# Patient Record
Sex: Male | Born: 1935
Health system: Southern US, Community
[De-identification: ages and names within clinical notes are randomized; demographics above are authoritative.]

## PROBLEM LIST (undated history)

## (undated) ENCOUNTER — Emergency Department (HOSPITAL_COMMUNITY): Admission: EM | Discharge status: Against Medical Advice | Payer: Medicare Other

## (undated) DIAGNOSIS — R0602 Shortness of breath: Secondary | ICD-10-CM

## (undated) DIAGNOSIS — E7800 Pure hypercholesterolemia, unspecified: Secondary | ICD-10-CM

## (undated) DIAGNOSIS — G4730 Sleep apnea, unspecified: Secondary | ICD-10-CM

## (undated) DIAGNOSIS — I2510 Atherosclerotic heart disease of native coronary artery without angina pectoris: Secondary | ICD-10-CM

## (undated) DIAGNOSIS — I739 Peripheral vascular disease, unspecified: Secondary | ICD-10-CM

## (undated) DIAGNOSIS — K219 Gastro-esophageal reflux disease without esophagitis: Secondary | ICD-10-CM

## (undated) DIAGNOSIS — I82409 Acute embolism and thrombosis of unspecified deep veins of unspecified lower extremity: Secondary | ICD-10-CM

## (undated) DIAGNOSIS — G4733 Obstructive sleep apnea (adult) (pediatric): Secondary | ICD-10-CM

## (undated) DIAGNOSIS — I10 Essential (primary) hypertension: Secondary | ICD-10-CM

## (undated) DIAGNOSIS — K5792 Diverticulitis of intestine, part unspecified, without perforation or abscess without bleeding: Secondary | ICD-10-CM

## (undated) DIAGNOSIS — E039 Hypothyroidism, unspecified: Secondary | ICD-10-CM

## (undated) DIAGNOSIS — E669 Obesity, unspecified: Secondary | ICD-10-CM

## (undated) DIAGNOSIS — J449 Chronic obstructive pulmonary disease, unspecified: Secondary | ICD-10-CM

## (undated) DIAGNOSIS — I5032 Chronic diastolic (congestive) heart failure: Secondary | ICD-10-CM

## (undated) DIAGNOSIS — F419 Anxiety disorder, unspecified: Secondary | ICD-10-CM

## (undated) HISTORY — DX: Obesity, unspecified: E66.9

## (undated) HISTORY — DX: Diverticulitis of intestine, part unspecified, without perforation or abscess without bleeding: K57.92

## (undated) HISTORY — DX: Chronic diastolic (congestive) heart failure: I50.32

## (undated) HISTORY — PX: CORONARY STENT PLACEMENT: SHX1402

## (undated) HISTORY — DX: Obstructive sleep apnea (adult) (pediatric): G47.33

## (undated) HISTORY — DX: Acute embolism and thrombosis of unspecified deep veins of unspecified lower extremity: I82.409

---

## 1990-10-14 HISTORY — PX: HERNIA REPAIR: SHX51

## 2002-08-05 ENCOUNTER — Ambulatory Visit (HOSPITAL_COMMUNITY): Admission: RE | Admit: 2002-08-05 | Discharge: 2002-08-05 | Payer: Self-pay | Admitting: Family Medicine

## 2002-08-05 ENCOUNTER — Encounter: Payer: Self-pay | Admitting: Family Medicine

## 2003-11-02 ENCOUNTER — Ambulatory Visit (HOSPITAL_COMMUNITY): Admission: RE | Admit: 2003-11-02 | Discharge: 2003-11-02 | Payer: Self-pay | Admitting: Gastroenterology

## 2003-11-02 ENCOUNTER — Encounter (INDEPENDENT_AMBULATORY_CARE_PROVIDER_SITE_OTHER): Payer: Self-pay | Admitting: Specialist

## 2003-12-05 ENCOUNTER — Ambulatory Visit (HOSPITAL_COMMUNITY): Admission: RE | Admit: 2003-12-05 | Discharge: 2003-12-05 | Payer: Self-pay | Admitting: Gastroenterology

## 2003-12-06 ENCOUNTER — Ambulatory Visit (HOSPITAL_COMMUNITY): Admission: RE | Admit: 2003-12-06 | Discharge: 2003-12-06 | Payer: Self-pay | Admitting: Gastroenterology

## 2003-12-09 ENCOUNTER — Ambulatory Visit (HOSPITAL_COMMUNITY): Admission: RE | Admit: 2003-12-09 | Discharge: 2003-12-09 | Payer: Self-pay | Admitting: Gastroenterology

## 2004-01-31 ENCOUNTER — Encounter: Admission: RE | Admit: 2004-01-31 | Discharge: 2004-01-31 | Payer: Self-pay | Admitting: Family Medicine

## 2004-02-13 ENCOUNTER — Encounter: Admission: RE | Admit: 2004-02-13 | Discharge: 2004-02-13 | Payer: Self-pay | Admitting: Family Medicine

## 2004-06-05 ENCOUNTER — Encounter: Admission: RE | Admit: 2004-06-05 | Discharge: 2004-06-05 | Payer: Self-pay | Admitting: Family Medicine

## 2004-08-22 ENCOUNTER — Ambulatory Visit (HOSPITAL_COMMUNITY): Admission: RE | Admit: 2004-08-22 | Discharge: 2004-08-22 | Payer: Self-pay | Admitting: Orthopedic Surgery

## 2004-08-27 ENCOUNTER — Encounter (INDEPENDENT_AMBULATORY_CARE_PROVIDER_SITE_OTHER): Payer: Self-pay | Admitting: *Deleted

## 2004-08-27 ENCOUNTER — Ambulatory Visit (HOSPITAL_COMMUNITY): Admission: RE | Admit: 2004-08-27 | Discharge: 2004-08-27 | Payer: Self-pay | Admitting: Gastroenterology

## 2004-08-28 ENCOUNTER — Ambulatory Visit (HOSPITAL_COMMUNITY): Admission: RE | Admit: 2004-08-28 | Discharge: 2004-08-28 | Payer: Self-pay | Admitting: Orthopedic Surgery

## 2005-01-30 ENCOUNTER — Encounter: Admission: RE | Admit: 2005-01-30 | Discharge: 2005-01-30 | Payer: Self-pay | Admitting: Family Medicine

## 2005-05-02 ENCOUNTER — Ambulatory Visit (HOSPITAL_BASED_OUTPATIENT_CLINIC_OR_DEPARTMENT_OTHER): Admission: RE | Admit: 2005-05-02 | Discharge: 2005-05-02 | Payer: Self-pay | Admitting: Family Medicine

## 2005-05-05 ENCOUNTER — Ambulatory Visit: Payer: Self-pay | Admitting: Internal Medicine

## 2005-06-28 ENCOUNTER — Encounter: Admission: RE | Admit: 2005-06-28 | Discharge: 2005-06-28 | Payer: Self-pay | Admitting: Cardiology

## 2005-07-11 ENCOUNTER — Inpatient Hospital Stay (HOSPITAL_COMMUNITY): Admission: EM | Admit: 2005-07-11 | Discharge: 2005-07-13 | Payer: Self-pay | Admitting: Emergency Medicine

## 2006-04-11 ENCOUNTER — Encounter: Admission: RE | Admit: 2006-04-11 | Discharge: 2006-04-11 | Payer: Self-pay | Admitting: Family Medicine

## 2006-04-14 ENCOUNTER — Encounter: Admission: RE | Admit: 2006-04-14 | Discharge: 2006-04-14 | Payer: Self-pay | Admitting: Family Medicine

## 2006-11-14 DIAGNOSIS — I739 Peripheral vascular disease, unspecified: Secondary | ICD-10-CM

## 2006-11-14 HISTORY — DX: Peripheral vascular disease, unspecified: I73.9

## 2006-11-18 ENCOUNTER — Ambulatory Visit: Payer: Self-pay | Admitting: Vascular Surgery

## 2006-11-20 ENCOUNTER — Ambulatory Visit: Payer: Self-pay | Admitting: Vascular Surgery

## 2006-11-20 ENCOUNTER — Ambulatory Visit (HOSPITAL_COMMUNITY): Admission: RE | Admit: 2006-11-20 | Discharge: 2006-11-20 | Payer: Self-pay | Admitting: Vascular Surgery

## 2006-12-16 ENCOUNTER — Ambulatory Visit: Payer: Self-pay | Admitting: Vascular Surgery

## 2007-01-13 ENCOUNTER — Ambulatory Visit: Payer: Self-pay | Admitting: Vascular Surgery

## 2007-04-14 ENCOUNTER — Ambulatory Visit: Payer: Self-pay | Admitting: Vascular Surgery

## 2007-07-06 ENCOUNTER — Encounter: Admission: RE | Admit: 2007-07-06 | Discharge: 2007-07-06 | Payer: Self-pay | Admitting: Gastroenterology

## 2007-07-22 ENCOUNTER — Encounter: Admission: RE | Admit: 2007-07-22 | Discharge: 2007-07-22 | Payer: Self-pay | Admitting: Family Medicine

## 2007-07-23 ENCOUNTER — Inpatient Hospital Stay (HOSPITAL_COMMUNITY): Admission: EM | Admit: 2007-07-23 | Discharge: 2007-07-30 | Payer: Self-pay | Admitting: Emergency Medicine

## 2007-09-08 ENCOUNTER — Encounter: Admission: RE | Admit: 2007-09-08 | Discharge: 2007-09-08 | Payer: Self-pay | Admitting: Family Medicine

## 2007-09-18 ENCOUNTER — Encounter: Admission: RE | Admit: 2007-09-18 | Discharge: 2007-09-18 | Payer: Self-pay | Admitting: Family Medicine

## 2007-10-21 ENCOUNTER — Encounter (INDEPENDENT_AMBULATORY_CARE_PROVIDER_SITE_OTHER): Payer: Self-pay | Admitting: Gastroenterology

## 2007-10-21 ENCOUNTER — Ambulatory Visit (HOSPITAL_COMMUNITY): Admission: RE | Admit: 2007-10-21 | Discharge: 2007-10-21 | Payer: Self-pay | Admitting: Gastroenterology

## 2007-10-27 ENCOUNTER — Ambulatory Visit: Payer: Self-pay | Admitting: Vascular Surgery

## 2008-01-05 ENCOUNTER — Inpatient Hospital Stay (HOSPITAL_COMMUNITY): Admission: EM | Admit: 2008-01-05 | Discharge: 2008-01-07 | Payer: Self-pay | Admitting: Emergency Medicine

## 2008-01-05 ENCOUNTER — Ambulatory Visit: Payer: Self-pay | Admitting: Cardiology

## 2008-01-06 ENCOUNTER — Encounter (INDEPENDENT_AMBULATORY_CARE_PROVIDER_SITE_OTHER): Payer: Self-pay | Admitting: Internal Medicine

## 2008-04-26 ENCOUNTER — Encounter: Admission: RE | Admit: 2008-04-26 | Discharge: 2008-04-26 | Payer: Self-pay | Admitting: Family Medicine

## 2008-11-01 ENCOUNTER — Ambulatory Visit: Payer: Self-pay | Admitting: Vascular Surgery

## 2009-03-20 ENCOUNTER — Encounter: Admission: RE | Admit: 2009-03-20 | Discharge: 2009-03-20 | Payer: Self-pay | Admitting: Family Medicine

## 2009-04-07 ENCOUNTER — Inpatient Hospital Stay (HOSPITAL_COMMUNITY): Admission: EM | Admit: 2009-04-07 | Discharge: 2009-04-12 | Payer: Self-pay | Admitting: Emergency Medicine

## 2009-04-07 ENCOUNTER — Encounter (INDEPENDENT_AMBULATORY_CARE_PROVIDER_SITE_OTHER): Payer: Self-pay | Admitting: Internal Medicine

## 2009-04-09 ENCOUNTER — Encounter: Payer: Self-pay | Admitting: Cardiology

## 2009-04-11 ENCOUNTER — Other Ambulatory Visit: Payer: Self-pay | Admitting: Cardiology

## 2009-04-12 ENCOUNTER — Other Ambulatory Visit: Payer: Self-pay | Admitting: Cardiology

## 2009-04-12 ENCOUNTER — Other Ambulatory Visit: Payer: Self-pay | Admitting: Interventional Cardiology

## 2009-05-16 ENCOUNTER — Ambulatory Visit: Payer: Self-pay | Admitting: Vascular Surgery

## 2009-10-23 ENCOUNTER — Encounter: Payer: Self-pay | Admitting: Internal Medicine

## 2009-10-26 ENCOUNTER — Encounter: Admission: RE | Admit: 2009-10-26 | Discharge: 2009-10-26 | Payer: Self-pay | Admitting: Family Medicine

## 2009-11-01 ENCOUNTER — Ambulatory Visit (HOSPITAL_BASED_OUTPATIENT_CLINIC_OR_DEPARTMENT_OTHER): Admission: RE | Admit: 2009-11-01 | Discharge: 2009-11-01 | Payer: Self-pay | Admitting: Orthopedic Surgery

## 2009-11-02 ENCOUNTER — Encounter: Payer: Self-pay | Admitting: Internal Medicine

## 2009-11-13 ENCOUNTER — Encounter: Payer: Self-pay | Admitting: Internal Medicine

## 2009-11-21 ENCOUNTER — Ambulatory Visit: Payer: Self-pay | Admitting: Internal Medicine

## 2009-11-21 DIAGNOSIS — I10 Essential (primary) hypertension: Secondary | ICD-10-CM | POA: Insufficient documentation

## 2009-11-21 DIAGNOSIS — Z8672 Personal history of thrombophlebitis: Secondary | ICD-10-CM | POA: Insufficient documentation

## 2009-11-21 DIAGNOSIS — E785 Hyperlipidemia, unspecified: Secondary | ICD-10-CM

## 2009-11-28 ENCOUNTER — Ambulatory Visit: Payer: Self-pay | Admitting: Vascular Surgery

## 2009-12-07 ENCOUNTER — Ambulatory Visit: Payer: Self-pay | Admitting: Internal Medicine

## 2009-12-07 DIAGNOSIS — R635 Abnormal weight gain: Secondary | ICD-10-CM | POA: Insufficient documentation

## 2009-12-18 ENCOUNTER — Ambulatory Visit: Payer: Self-pay | Admitting: Internal Medicine

## 2010-05-02 ENCOUNTER — Ambulatory Visit (HOSPITAL_COMMUNITY)
Admission: RE | Admit: 2010-05-02 | Discharge: 2010-05-02 | Payer: Self-pay | Source: Home / Self Care | Admitting: Orthopedic Surgery

## 2010-08-14 ENCOUNTER — Ambulatory Visit: Payer: Self-pay | Admitting: Vascular Surgery

## 2010-09-20 ENCOUNTER — Encounter: Admission: RE | Admit: 2010-09-20 | Payer: Self-pay | Source: Home / Self Care | Admitting: Family Medicine

## 2010-10-09 ENCOUNTER — Ambulatory Visit: Payer: Self-pay | Admitting: Vascular Surgery

## 2010-11-04 ENCOUNTER — Encounter: Payer: Self-pay | Admitting: Cardiology

## 2010-11-13 NOTE — Letter (Signed)
Summary: [facility]  [facility]   Imported By: Lester Nelson 11/27/2009 10:28:28  _____________________________________________________________________  External Attachment:    Type:   Image     Comment:   External Document

## 2010-11-13 NOTE — Assessment & Plan Note (Signed)
Summary: Pulmonary/ new pt eval for sob   Visit Type:  Initial Consult Copy to:  Dr. Armanda Magic Primary Provider/Referring Provider:  Dr. Lupita Raider  CC:  Dyspnea.  History of Present Illness: 31 yobm  with morbid obesity quit smoking in the 1965 with no resp problems needs to be be cleared for surgery by Dr Mina Marble.  November 21, 2009 initial cc sob x 3 years progressively more doe x with some variability worse with hill and also trouble lying down immediate sob sleeps with pillows on a wedge, assoc with worsening cough, mostly dry, worse as day goes on assoc with hoarseness.  cards eval in progress.    Pt denies any significant sore throat, dysphagia, itching, sneezing,  nasal congestion or excess secretions,  fever, chills, sweats, unintended wt loss, pleuritic or exertional cp, hempoptysis, change in activity tolerance  orthopnea pnd or leg swelling Pt also denies any obvious fluctuation in symptoms with weather or environmental change or other alleviating or aggravating factors.       Current Medications (verified): 1)  Px Enteric Aspirin 325 Mg Tbec (Aspirin) .Marland Kitchen.. 1 Once Daily 2)  Levothyroxine Sodium 175 Mcg Tabs (Levothyroxine Sodium) .Marland Kitchen.. 1 Once Daily 3)  Vytorin 10-80 Mg Tabs (Ezetimibe-Simvastatin) .Marland Kitchen.. 1 Once Daily 4)  Furosemide 20 Mg Tabs (Furosemide) .Marland Kitchen.. 1 Once Daily 5)  Metoprolol Tartrate 25 Mg Tabs (Metoprolol Tartrate) .Marland Kitchen.. 1 Once Daily 6)  Glimepiride 1 Mg Tabs (Glimepiride) .Marland Kitchen.. 1 Once Daily 7)  Isosorbide Dinitrate 30 Mg Tabs (Isosorbide Dinitrate) .Marland Kitchen.. 1 Once Daily 8)  Imodium A-D 2 Mg Tabs (Loperamide Hcl) .... As Needed 9)  Beano  Tabs (Alpha-D-Galactosidase) .... As Needed 10)  Zyrtec Allergy 10 Mg Tabs (Cetirizine Hcl) .Marland Kitchen.. 1 Once Daily As Needed  Allergies (verified): 1)  ! Plavix (Clopidogrel Bisulfate)  Past History:  Past Medical History: MORBID OBESITY OBSTRUCTIVE SLEEP APNEA (ICD-327.23) Hx of DEEP VENOUS THROMBOPHLEBITIS, LEFT, LEG, HX  OF (ICD-V12.52) HYPERLIPIDEMIA (ICD-272.4) ASTHMA (ICD-493.90) DYSPHAGIA    - Ba swallow 1/13/2011Slow transit of thick contrast in the distal thoracic esophagus and through the gastroesophageal junction, without any evidence of fixed stricture or mass.  The finding seems most likely related to spasm of the distal thoracic esophagus and lower esophageal sphincter. 2.  Normal cervical and proximal thoracic esophagus. 3.  Incidental right laryngeal diverticulum.  Family History: Heart dz- Sister Breast CA- Sister  Social History: Former smoker.  Quit 1027.   Widowed Children Lives with his Daughter Retired  Review of Systems       The patient complains of shortness of breath with activity, shortness of breath at rest, productive cough, chest pain, acid heartburn, indigestion, loss of appetite, difficulty swallowing, tooth/dental problems, sneezing, hand/feet swelling, and joint stiffness or pain.  The patient denies non-productive cough, coughing up blood, irregular heartbeats, weight change, abdominal pain, sore throat, headaches, nasal congestion/difficulty breathing through nose, itching, ear ache, anxiety, depression, rash, change in color of mucus, and fever.    Vital Signs:  Patient profile:   77 year old male Height:      64 inches Weight:      225.25 pounds BMI:     38.80 O2 Sat:      95 % on Room air Temp:     97.7 degrees F oral Pulse rate:   77 / minute BP sitting:   126 / 64  (left arm) Cuff size:   large  Vitals Entered By: Vernie Murders (November 21, 2009 11:40  AM)  O2 Flow:  Room air  Serial Vital Signs/Assessments:  Comments: 12:29 PM Ambulatory Pulse Oximetry  Resting; HR__77___    02 Sat__91%ra___  Lap1 (185 feet)   HR__96___   02 Sat__90%ra___ Lap2 (185 feet)   HR__105___   02 Sat__87%ra___    Lap3 (185 feet)   HR_____   02 Sat_____  ___Test Completed without Difficulty _x__Test Stopped due [id number] o2 sat   By: Vernie Murders    Physical Exam  Additional Exam:  wt 225 November 21, 2009 obese amb black male slt hoarse with classic voice fatigue HEENT mild turbinate edema.  Oropharynx no thrush or excess pnd or cobblestoning.  No JVD or cervical adenopathy. Mild accessory muscle hypertrophy. Trachea midline, nl thryroid. Chest was hyperinflated by percussion with diminished breath sounds and moderate increased exp time without wheeze. Hoover sign positive at mid inspiration. Regular rate and rhythm without murmur gallop or rub.  slt   increase in P2 and 1+ edema.  Abd: no hsm, nl excursion. Ext warm without cyanosis or clubbing.     CXR  Procedure date:  11/21/2009  Findings:      Comparison: 04/06/2009   Findings: Cardiomediastinal silhouette is stable.  No acute infiltrate or pleural effusion.  No pulmonary edema. Stable calcified granuloma left upper lobe measures about  1 cm. Mild degenerative changes mid thoracic spine. Scarring or atelectasis in the right middle lobe.   IMPRESSION: No infiltrate or edema.  Scarring or atelectasis in the right middle lobe.  Stable calcified granuloma left upper lobe.  Impression & Recommendations:  Problem # 1:  ASTHMA (ICD-493.90)  DDX of  difficult airways managment all start with A and  include Adherence, Ace Inhibitors, Acid Reflux, Active Sinus Disease, Alpha 1 Antitripsin deficiency, Anxiety masquerading as Airways dz,  ABPA,  allergy(esp in young), Aspiration (esp in elderly), Adverse effects of DPI,  Active smokers, plus one B  = Beta blocker use..   Not sure he has asthma but variability suggests either asthma or pseudoasthma both potentially exac by Acid Reflux, occult Active sinus dz or BB use   I spent extra time with the patient today explaining optimal mdi  technique.  This improved from  25-50%  Problem # 2:  HYPERTENSION, BENIGN (ICD-401.1)  The following medications were removed from the medication list:    Metoprolol Tartrate 25 Mg Tabs  (Metoprolol tartrate) .Marland Kitchen... 1 once daily His updated medication list for this problem includes:    Furosemide 20 Mg Tabs (Furosemide) .Marland Kitchen... 1 once daily    Bystolic 5 Mg Tabs (Nebivolol hcl) ..... One tablet daily  Orders: New Patient Level V (10175)  Problem # 3:  OBSTRUCTIVE SLEEP APNEA (ICD-327.23)  needs to try cpap and 02 as prescribed  Orders: New Patient Level V (10258)  Medications Added to Medication List This Visit: 1)  Px Enteric Aspirin 325 Mg Tbec (Aspirin) .Marland Kitchen.. 1 once daily 2)  Levothyroxine Sodium 175 Mcg Tabs (Levothyroxine sodium) .Marland Kitchen.. 1 once daily 3)  Vytorin 10-80 Mg Tabs (Ezetimibe-simvastatin) .Marland Kitchen.. 1 once daily 4)  Furosemide 20 Mg Tabs (Furosemide) .Marland Kitchen.. 1 once daily 5)  Metoprolol Tartrate 25 Mg Tabs (Metoprolol tartrate) .Marland Kitchen.. 1 once daily 6)  Glimepiride 1 Mg Tabs (Glimepiride) .Marland Kitchen.. 1 once daily 7)  Isosorbide Dinitrate 30 Mg Tabs (Isosorbide dinitrate) .Marland Kitchen.. 1 once daily 8)  Imodium A-d 2 Mg Tabs (Loperamide hcl) .... As needed 9)  Beano Tabs (Alpha-d-galactosidase) .... As needed 10)  Zyrtec Allergy 10 Mg Tabs (  Cetirizine hcl) .Marland Kitchen.. 1 once daily as needed 11)  Dexilant 60 Mg Cpdr (Dexlansoprazole) .... Take  one 30-60 min before first meal of the day 12)  Pepcid 20  .... One at bedtime 13)  Bystolic 5 Mg Tabs (Nebivolol hcl) .... One tablet daily 14)  Symbicort 160-4.5 Mcg/act Aero (Budesonide-formoterol fumarate) .... 2 puffs first thing  in am and 2 puffs again in pm about 12 hours later  Other Orders: T-2 View CXR (71020TC) Pulse Oximetry, Ambulatory (16109)  Patient Instructions: 1)  Stop metaprolol and start bystolic 5 mg one daily in its place 2)  Acid reflux is a leading suspect here and needs to be eliminated  completely before considering additional studies or treatment options. To suppress this maximally, take DEXILANT 60 mg 30 min before first  meal and  PEPCID ac  20 mg (otc) at bedtime (whether you think you need it or now)  plus diet measures  as listed.  3)  GERD (REFLUX)  is a common cause of respiratory symptoms. It commonly presents without heartburn and can be treated with medication, but also with lifestyle changes including avoidance of late meals, excessive alcohol, smoking cessation, and avoid fatty foods, chocolate, peppermint, colas, red wine, and acidic juices such as orange juice. NO MINT OR MENTHOL PRODUCTS SO NO COUGH DROPS  4)  USE SUGARLESS CANDY INSTEAD (jolley ranchers)  5)  NO OIL BASED VITAMINS  6)  Wear bipap at bedtime 7)  If you can tolerate lying  flat on cpap/02  with the changes above you are cleared for surgery but you'll need to have your surgery as inpt and take the bipap with you 8)  either way need to return in 2 weeks

## 2010-11-13 NOTE — Miscellaneous (Signed)
Summary: Orders Update pft charges  Clinical Lists Changes  Orders: Added new Service order of Carbon Monoxide diffusing w/capacity (94720) - Signed Added new Service order of Lung Volumes (94240) - Signed Added new Service order of Spirometry (Pre & Post) (94060) - Signed 

## 2010-11-13 NOTE — Assessment & Plan Note (Signed)
Summary: Pulmonary/ f/u ov with hfa teaching and amb sat/spirometry   Copy to:  Dr. Armanda Magic Primary Provider/Referring Provider:  Dr. Lupita Raider  CC:  2 wk followup. Pt states that his breathing is about the same- "maybe a little better".  He still can not lay down flat on his back without getting out of breath.  He states that dexilant and pepcid has helped with swallowing.Marland Kitchen  History of Present Illness: 81 yobm  with morbid obesity quit smoking in the 1965 with no resp problems needs to be be cleared for surgery by Dr Mina Marble.  November 21, 2009 initial cc sob x 3 years progressively more doe x with some variability worse with hills and also trouble lying down immediate sob sleeps with pillows on a wedge, assoc with worsening cough, mostly dry, worse as day goes on assoc with hoarseness.  cards eval in progress. rec Stop metaprolol and start bystolic 5 mg one daily in its place take DEXILANT 60 mg 30 min before first  meal and  PEPCID ac  20 mg (otc) at bedtime (whether you think you need it or now)  plus diet measures as listed. Wear bipap at bedtime. Trial of symbicort    December 07, 2009 2 wk followup. Pt states that his breathing is about the same- "maybe a little better".  He still can not lay down flat on his back without getting out of breath.  He states that dexilant and pepcid has helped with swallowing. Stopped Dr Norris Cross anticoagulant because he read it could cause bleeding but has no bleeding.  Pt denies any significant sore throat, dysphagia, itching, sneezing,  nasal congestion or excess secretions,  fever, chills, sweats, unintended wt loss, pleuritic or exertional cp, hempoptysis, change in activity tolerance  orthopnea pnd or leg swelling Pt also denies any obvious fluctuation in symptoms with weather or environmental change or other alleviating or aggravating factors.     Not using symbicort as rec.      Current Medications (verified): 1)  Px Enteric Aspirin 325  Mg Tbec (Aspirin) .Marland Kitchen.. 1 Once Daily 2)  Levothyroxine Sodium 175 Mcg Tabs (Levothyroxine Sodium) .Marland Kitchen.. 1 Once Daily 3)  Vytorin 10-80 Mg Tabs (Ezetimibe-Simvastatin) .Marland Kitchen.. 1 Once Daily 4)  Furosemide 20 Mg Tabs (Furosemide) .Marland Kitchen.. 1 Once Daily 5)  Glimepiride 1 Mg Tabs (Glimepiride) .Marland Kitchen.. 1 Once Daily 6)  Isosorbide Dinitrate 30 Mg Tabs (Isosorbide Dinitrate) .Marland Kitchen.. 1 Once Daily 7)  Imodium A-D 2 Mg Tabs (Loperamide Hcl) .... As Needed 8)  Beano  Tabs (Alpha-D-Galactosidase) .... As Needed 9)  Zyrtec Allergy 10 Mg Tabs (Cetirizine Hcl) .Marland Kitchen.. 1 Once Daily As Needed 10)  Dexilant 60 Mg Cpdr (Dexlansoprazole) .... Take  One 30-60 Min Before First Meal of The Day 11)  Pepcid 20 .... One At Bedtime 12)  Bystolic 5 Mg  Tabs (Nebivolol Hcl) .... One Tablet Daily 13)  Symbicort 160-4.5 Mcg/act  Aero (Budesonide-Formoterol Fumarate) .... 2 Puffs First Thing  in Am and 2 Puffs Again in Pm About 12 Hours Later  Allergies (verified): 1)  ! Plavix (Clopidogrel Bisulfate)  Past History:  Past Medical History: MORBID OBESITY OBSTRUCTIVE SLEEP APNEA (ICD-327.23) Hx of DEEP VENOUS THROMBOPHLEBITIS, LEFT, LEG, HX OF (ICD-V12.52) HYPERLIPIDEMIA (ICD-272.4) ASTHMA (ICD-493.90)     - PFT's December 07, 2009 FEV1/FVC 50% DYSPHAGIA...................................................................Johnson     - Ba swallow 1/13/2011Slow transit of thick contrast in the distal thoracic esophagus and through the gastroesophageal junction, without any evidence of fixed stricture or mass.  The finding seems most likely related to spasm of the distal thoracic esophagus and lower esophageal sphincter. 2.  Normal cervical and proximal thoracic esophagus. 3.  Incidental right laryngeal diverticulum.  Vital Signs:  Patient profile:   77 year old male Weight:      225 pounds O2 Sat:      95 % on Room air Temp:     97.7 degrees F oral Pulse rate:   80 / minute BP sitting:   140 / 78  (left arm) Cuff size:    large  Vitals Entered By: Vernie Murders (December 07, 2009 3:57 PM)  O2 Flow:  Room air  Serial Vital Signs/Assessments:  Comments: 5:31 PM Ambulatory Pulse Oximetry  Resting; HR__80___    02 Sat__94%ra___  Lap1 (185 feet)   HR__101___   02 Sat__92%ra___ Lap2 (185 feet)   HR_127____   02 Sat__87%ra___    Lap3 (185 feet)   HR_____   02 Sat_____  ___Test Completed without Difficulty _x__Test Stopped due to: decreased o2 sats   By: Vernie Murders    Physical Exam  Additional Exam:  wt 225 November 21, 2009 > 225 December 07, 2009  obese amb black male slt hoarse with classic voice fatigue minimally improved HEENT mild turbinate edema.  Oropharynx no thrush or excess pnd or cobblestoning.  No JVD or cervical adenopathy. Mild accessory muscle hypertrophy. Trachea midline, nl thryroid. Chest was hyperinflated by percussion with diminished breath sounds and moderate increased exp time without wheeze. Hoover sign positive at mid inspiration. Regular rate and rhythm without murmur gallop or rub.  slt   increase in P2 and 1+ edema.  Abd: no hsm,  limited supine  excursion. Ext warm without cyanosis or clubbing.     Pulmonary Function Test Date: 12/07/2009 5:03 PM Gender: Male  Pre-Spirometry FVC    Value: 7.15 L/min   % Pred: 254 % FEV1    Value: 3.54 L     Pred: 2.03 L     % Pred: 174.80 % FEV1/FVC  Value: 49.55 %     % Pred: 65.90 %  Impression & Recommendations:  Problem # 1:  ASTHMA (ICD-493.90)   DDX of  difficult airways managment all start with A and  include Adherence, Ace Inhibitors, Acid Reflux, Active Sinus Disease, Alpha 1 Antitripsin deficiency, Anxiety masquerading as Airways dz,  ABPA,  allergy(esp in young), Aspiration (esp in elderly), Adverse effects of DPI,  Active smokers, plus one B  = Beta blocker use..   Not sure he has asthma but variability suggests either asthma or pseudoasthma both potentially exac by Acid Reflux, occult Active sinus dz or BB use   I  spent extra time with the patient today explaining optimal mdi  technique.  This improved from  50> 75%  Problem # 2:  HYPERTENSION, BENIGN (ICD-401.1)  His updated medication list for this problem includes:    Furosemide 20 Mg Tabs (Furosemide) .Marland Kitchen... 1 once daily    Bystolic 5 Mg Tabs (Nebivolol hcl) ..... One tablet daily  Orders: Est. Patient Level IV (69629)  Problem # 3:  WEIGHT GAIN, ABNORMAL (ICD-783.1)  This is a major issue for purposes of clearing him for surgery but this is relatively low risk RUE which may need general if gets in any trouble supine; however, he will need to be cleared for this by Dr Mayford Knife and communicate with her more directily re: risk/benefits of anticoagulants.  Orders: Est. Patient Level IV (52841)  Other Orders: Pulse  Oximetry, Ambulatory (78295) Spirometry w/Graph (94010)  Patient Instructions: 1)  Work on inhaler technique:  relax and blow all the way out then take a nice smooth deep breath back in, triggering the inhaler at same time you start breathing in  2)  Stay on symbicort 160 2 puffs first thing  in am and 2 puffs again in pm about 12 hours later  3)  You are cleared from a pulmonary perspective  for surgery but it will need to be done as an inpatient and may require general anesthesia.  4)  Scedule full pft's asap   CardioPerfect Spirometry  ID: [id number] Patient: AHARON, CARRIERE DOB: 11-21-1929 Age: 77 Years Old Sex: Male Race: Black Height: 64 Weight: 225 Status: Unconfirmed Past Medical History:  MORBID OBESITY OBSTRUCTIVE SLEEP APNEA (ICD-327.23) Hx of DEEP VENOUS THROMBOPHLEBITIS, LEFT, LEG, HX OF (ICD-V12.52) HYPERLIPIDEMIA (ICD-272.4) ASTHMA (ICD-493.90) DYSPHAGIA    - Ba swallow 1/13/2011Slow transit of thick contrast in the distal thoracic esophagus and through the gastroesophageal junction, without any evidence of fixed stricture or mass.  The finding seems most likely related to spasm of the distal thoracic  esophagus and lower esophageal sphincter. 2.  Normal cervical and proximal thoracic esophagus. 3.  Incidental right laryngeal diverticulum.   Recorded: 12/07/2009 5:03 PM  Parameter  Measured Predicted %Predicted FVC     7.15        2.81        254 FEV1     3.54        2.03        174.80 FEV1%   49.55        75.16        65.90 PEF    11.09        6.26        177.30   Interpretation:

## 2010-12-29 LAB — GLUCOSE, CAPILLARY
Glucose-Capillary: 138 mg/dL — ABNORMAL HIGH (ref 70–99)
Glucose-Capillary: 154 mg/dL — ABNORMAL HIGH (ref 70–99)

## 2010-12-29 LAB — COMPREHENSIVE METABOLIC PANEL
ALT: 34 U/L (ref 0–53)
AST: 31 U/L (ref 0–37)
Albumin: 4.1 g/dL (ref 3.5–5.2)
Alkaline Phosphatase: 57 U/L (ref 39–117)
BUN: 12 mg/dL (ref 6–23)
CO2: 28 mEq/L (ref 19–32)
Calcium: 10 mg/dL (ref 8.4–10.5)
Chloride: 104 mEq/L (ref 96–112)
Creatinine, Ser: 1.1 mg/dL (ref 0.4–1.5)
GFR calc Af Amer: >60 mL/min (ref >60)
GFR calc non Af Amer: >60 mL/min (ref >60)
Glucose, Bld: 92 mg/dL (ref 70–99)
Potassium: 4.5 mEq/L (ref 3.5–5.1)
Sodium: 138 mEq/L (ref 135–145)
Total Bilirubin: 0.5 mg/dL (ref 0.3–1.2)
Total Protein: 7.3 g/dL (ref 6.0–8.3)

## 2010-12-29 LAB — CBC
HCT: 44.5 % (ref 39.0–52.0)
Hemoglobin: 14.8 g/dL (ref 13.0–17.0)
MCH: 29.9 pg (ref 26.0–34.0)
MCHC: 33.2 g/dL (ref 30.0–36.0)
MCV: 90 fL (ref 78.0–100.0)
Platelets: 135 10*3/uL — ABNORMAL LOW (ref 150–400)
RBC: 4.94 MIL/uL (ref 4.22–5.81)
RDW: 14.8 % (ref 11.5–15.5)
WBC: 9 10*3/uL (ref 4.0–10.5)

## 2010-12-29 LAB — SURGICAL PCR SCREEN
MRSA, PCR: NEGATIVE
Staphylococcus aureus: NEGATIVE

## 2010-12-30 LAB — BASIC METABOLIC PANEL
BUN: 16 mg/dL (ref 6–23)
CO2: 34 mEq/L — ABNORMAL HIGH (ref 19–32)
Calcium: 9.4 mg/dL (ref 8.4–10.5)
Chloride: 101 mEq/L (ref 96–112)
Creatinine, Ser: 1.1 mg/dL (ref 0.4–1.5)
GFR calc Af Amer: >60 mL/min (ref >60)
GFR calc non Af Amer: >60 mL/min (ref >60)
Glucose, Bld: 242 mg/dL — ABNORMAL HIGH (ref 70–99)
Potassium: 5 mEq/L (ref 3.5–5.1)
Sodium: 140 mEq/L (ref 135–145)

## 2010-12-30 LAB — POCT HEMOGLOBIN-HEMACUE: Hemoglobin: 13.1 g/dL (ref 13.0–17.0)

## 2010-12-30 LAB — GLUCOSE, CAPILLARY: Glucose-Capillary: 121 mg/dL — ABNORMAL HIGH (ref 70–99)

## 2011-01-05 ENCOUNTER — Emergency Department (HOSPITAL_COMMUNITY)
Admission: EM | Admit: 2011-01-05 | Discharge: 2011-01-05 | Disposition: A | Discharge status: Home / Self Care | Payer: Medicare Other | Attending: Emergency Medicine | Admitting: Emergency Medicine

## 2011-01-05 ENCOUNTER — Emergency Department (HOSPITAL_COMMUNITY): Payer: Medicare Other

## 2011-01-05 ENCOUNTER — Encounter (HOSPITAL_COMMUNITY): Payer: Self-pay | Admitting: Radiology

## 2011-01-05 DIAGNOSIS — J45909 Unspecified asthma, uncomplicated: Secondary | ICD-10-CM | POA: Insufficient documentation

## 2011-01-05 DIAGNOSIS — R079 Chest pain, unspecified: Secondary | ICD-10-CM | POA: Insufficient documentation

## 2011-01-05 DIAGNOSIS — E119 Type 2 diabetes mellitus without complications: Secondary | ICD-10-CM | POA: Insufficient documentation

## 2011-01-05 DIAGNOSIS — R61 Generalized hyperhidrosis: Secondary | ICD-10-CM | POA: Insufficient documentation

## 2011-01-05 DIAGNOSIS — R0602 Shortness of breath: Secondary | ICD-10-CM | POA: Insufficient documentation

## 2011-01-05 LAB — URINALYSIS, ROUTINE W REFLEX MICROSCOPIC
Glucose, UA: NEGATIVE mg/dL
Hgb urine dipstick: NEGATIVE
Ketones, ur: NEGATIVE mg/dL
Protein, ur: NEGATIVE mg/dL
pH: 6.5 (ref 5.0–8.0)

## 2011-01-05 LAB — TROPONIN I: Troponin I: 0.03 ng/mL (ref 0.00–0.06)

## 2011-01-05 LAB — CK TOTAL AND CKMB (NOT AT ARMC)
CK, MB: 6.2 ng/mL (ref 0.3–4.0)
Total CK: 417 U/L — ABNORMAL HIGH (ref 7–232)

## 2011-01-05 LAB — BASIC METABOLIC PANEL
BUN: 13 mg/dL (ref 6–23)
CO2: 26 mEq/L (ref 19–32)
Chloride: 104 mEq/L (ref 96–112)
Creatinine, Ser: 1.33 mg/dL (ref 0.4–1.5)
Potassium: 3.8 mEq/L (ref 3.5–5.1)

## 2011-01-05 LAB — CBC
MCV: 87.7 fL (ref 78.0–100.0)
Platelets: 154 10*3/uL (ref 150–400)
RBC: 4.81 MIL/uL (ref 4.22–5.81)
RDW: 13.6 % (ref 11.5–15.5)
WBC: 8.7 10*3/uL (ref 4.0–10.5)

## 2011-01-05 LAB — POCT CARDIAC MARKERS
CKMB, poc: 8.3 ng/mL (ref 1.0–8.0)
Myoglobin, poc: 184 ng/mL (ref 12–200)
Troponin i, poc: <0.05 ng/mL (ref 0.00–0.09)

## 2011-01-05 LAB — DIFFERENTIAL
Basophils Absolute: 0 10*3/uL (ref 0.0–0.1)
Basophils Relative: 0 % (ref 0–1)
Eosinophils Absolute: 0.2 10*3/uL (ref 0.0–0.7)
Lymphs Abs: 2.8 10*3/uL (ref 0.7–4.0)
Neutrophils Relative %: 59 % (ref 43–77)

## 2011-01-05 MED ORDER — IOHEXOL 300 MG/ML  SOLN
100.0000 mL | Freq: Once | INTRAMUSCULAR | Status: AC | PRN
  Administered 2011-01-05: 100 mL via INTRAVENOUS

## 2011-01-21 LAB — GLUCOSE, CAPILLARY
Glucose-Capillary: 103 mg/dL — ABNORMAL HIGH (ref 70–99)
Glucose-Capillary: 113 mg/dL — ABNORMAL HIGH (ref 70–99)
Glucose-Capillary: 116 mg/dL — ABNORMAL HIGH (ref 70–99)
Glucose-Capillary: 118 mg/dL — ABNORMAL HIGH (ref 70–99)
Glucose-Capillary: 118 mg/dL — ABNORMAL HIGH (ref 70–99)
Glucose-Capillary: 128 mg/dL — ABNORMAL HIGH (ref 70–99)
Glucose-Capillary: 133 mg/dL — ABNORMAL HIGH (ref 70–99)
Glucose-Capillary: 134 mg/dL — ABNORMAL HIGH (ref 70–99)
Glucose-Capillary: 135 mg/dL — ABNORMAL HIGH (ref 70–99)
Glucose-Capillary: 138 mg/dL — ABNORMAL HIGH (ref 70–99)
Glucose-Capillary: 151 mg/dL — ABNORMAL HIGH (ref 70–99)
Glucose-Capillary: 159 mg/dL — ABNORMAL HIGH (ref 70–99)
Glucose-Capillary: 87 mg/dL (ref 70–99)
Glucose-Capillary: 93 mg/dL (ref 70–99)

## 2011-01-21 LAB — CBC
HCT: 44.4 % (ref 39.0–52.0)
HCT: 44.4 % (ref 39.0–52.0)
HCT: 45.5 % (ref 39.0–52.0)
HCT: 47.5 % (ref 39.0–52.0)
Hemoglobin: 15.3 g/dL (ref 13.0–17.0)
MCHC: 31.9 g/dL (ref 30.0–36.0)
MCHC: 32.8 g/dL (ref 30.0–36.0)
MCHC: 33.3 g/dL (ref 30.0–36.0)
MCHC: 33.3 g/dL (ref 30.0–36.0)
MCHC: 33.4 g/dL (ref 30.0–36.0)
MCV: 89.8 fL (ref 78.0–100.0)
MCV: 90.1 fL (ref 78.0–100.0)
MCV: 90.4 fL (ref 78.0–100.0)
MCV: 90.8 fL (ref 78.0–100.0)
MCV: 91.1 fL (ref 78.0–100.0)
Platelets: 123 10*3/uL — ABNORMAL LOW (ref 150–400)
Platelets: 127 10*3/uL — ABNORMAL LOW (ref 150–400)
Platelets: 132 10*3/uL — ABNORMAL LOW (ref 150–400)
RBC: 4.91 MIL/uL (ref 4.22–5.81)
RBC: 5.01 MIL/uL (ref 4.22–5.81)
RBC: 5.27 MIL/uL (ref 4.22–5.81)
RDW: 14 % (ref 11.5–15.5)
RDW: 14.3 % (ref 11.5–15.5)
RDW: 14.4 % (ref 11.5–15.5)
WBC: 6.9 10*3/uL (ref 4.0–10.5)
WBC: 7.9 10*3/uL (ref 4.0–10.5)
WBC: 8.9 10*3/uL (ref 4.0–10.5)

## 2011-01-21 LAB — DIFFERENTIAL
Basophils Absolute: 0 10*3/uL (ref 0.0–0.1)
Basophils Absolute: 0 10*3/uL (ref 0.0–0.1)
Basophils Relative: 0 % (ref 0–1)
Eosinophils Absolute: 0.2 10*3/uL (ref 0.0–0.7)
Eosinophils Relative: 2 % (ref 0–5)
Eosinophils Relative: 2 % (ref 0–5)
Lymphocytes Relative: 33 % (ref 12–46)
Lymphocytes Relative: 34 % (ref 12–46)
Lymphs Abs: 2.3 10*3/uL (ref 0.7–4.0)
Lymphs Abs: 2.9 10*3/uL (ref 0.7–4.0)
Lymphs Abs: 3.1 10*3/uL (ref 0.7–4.0)
Monocytes Absolute: 0.7 10*3/uL (ref 0.1–1.0)
Monocytes Relative: 9 % (ref 3–12)
Neutro Abs: 3.8 10*3/uL (ref 1.7–7.7)
Neutro Abs: 5.1 10*3/uL (ref 1.7–7.7)
Neutrophils Relative %: 50 % (ref 43–77)
Neutrophils Relative %: 56 % (ref 43–77)

## 2011-01-21 LAB — BASIC METABOLIC PANEL
BUN: 16 mg/dL (ref 6–23)
BUN: 17 mg/dL (ref 6–23)
BUN: 17 mg/dL (ref 6–23)
CO2: 32 mEq/L (ref 19–32)
CO2: 32 mEq/L (ref 19–32)
CO2: 32 mEq/L (ref 19–32)
CO2: 32 mEq/L (ref 19–32)
Calcium: 9.2 mg/dL (ref 8.4–10.5)
Calcium: 9.5 mg/dL (ref 8.4–10.5)
Calcium: 9.6 mg/dL (ref 8.4–10.5)
Chloride: 102 mEq/L (ref 96–112)
Chloride: 103 mEq/L (ref 96–112)
Chloride: 104 mEq/L (ref 96–112)
Chloride: 99 mEq/L (ref 96–112)
Creatinine, Ser: 1.05 mg/dL (ref 0.4–1.5)
Creatinine, Ser: 1.07 mg/dL (ref 0.4–1.5)
Creatinine, Ser: 1.09 mg/dL (ref 0.4–1.5)
Creatinine, Ser: 1.12 mg/dL (ref 0.4–1.5)
GFR calc Af Amer: >60 mL/min (ref >60)
GFR calc Af Amer: >60 mL/min (ref >60)
GFR calc Af Amer: >60 mL/min (ref >60)
GFR calc non Af Amer: >60 mL/min (ref >60)
GFR calc non Af Amer: >60 mL/min (ref >60)
Glucose, Bld: 109 mg/dL — ABNORMAL HIGH (ref 70–99)
Glucose, Bld: 125 mg/dL — ABNORMAL HIGH (ref 70–99)
Glucose, Bld: 91 mg/dL (ref 70–99)
Potassium: 3.8 mEq/L (ref 3.5–5.1)
Potassium: 3.9 mEq/L (ref 3.5–5.1)
Potassium: 4 mEq/L (ref 3.5–5.1)
Potassium: 4.1 mEq/L (ref 3.5–5.1)
Sodium: 139 mEq/L (ref 135–145)
Sodium: 140 mEq/L (ref 135–145)

## 2011-01-21 LAB — CARDIAC PANEL(CRET KIN+CKTOT+MB+TROPI)
CK, MB: 3.6 ng/mL (ref 0.3–4.0)
CK, MB: 3.9 ng/mL (ref 0.3–4.0)
Relative Index: 2.5 (ref 0.0–2.5)
Total CK: 143 U/L (ref 7–232)

## 2011-01-21 LAB — COMPREHENSIVE METABOLIC PANEL
AST: 29 U/L (ref 0–37)
BUN: 15 mg/dL (ref 6–23)
CO2: 29 mEq/L (ref 19–32)
Calcium: 9.5 mg/dL (ref 8.4–10.5)
Chloride: 100 mEq/L (ref 96–112)
Creatinine, Ser: 1 mg/dL (ref 0.4–1.5)
GFR calc Af Amer: >60 mL/min (ref >60)
GFR calc non Af Amer: >60 mL/min (ref >60)
Glucose, Bld: 132 mg/dL — ABNORMAL HIGH (ref 70–99)
Total Bilirubin: 0.6 mg/dL (ref 0.3–1.2)

## 2011-01-21 LAB — SAVE SMEAR

## 2011-01-21 LAB — HEMOGLOBIN A1C
Hgb A1c MFr Bld: 7.4 % — ABNORMAL HIGH (ref 4.6–6.1)
Mean Plasma Glucose: 166 mg/dL

## 2011-01-21 LAB — POCT CARDIAC MARKERS
CKMB, poc: 2.3 ng/mL (ref 1.0–8.0)
Troponin i, poc: <0.05 ng/mL (ref 0.00–0.09)

## 2011-01-21 LAB — CK TOTAL AND CKMB (NOT AT ARMC): Relative Index: 2.6 — ABNORMAL HIGH (ref 0.0–2.5)

## 2011-01-21 LAB — LIPID PANEL
Cholesterol: 139 mg/dL (ref 0–200)
HDL: 33 mg/dL — ABNORMAL LOW (ref >39)
Total CHOL/HDL Ratio: 4.2 RATIO
Triglycerides: 108 mg/dL (ref <150)

## 2011-01-21 LAB — LIPASE, BLOOD: Lipase: 20 U/L (ref 11–59)

## 2011-01-21 LAB — PROTIME-INR: Prothrombin Time: 13.7 seconds (ref 11.6–15.2)

## 2011-01-22 ENCOUNTER — Inpatient Hospital Stay (HOSPITAL_BASED_OUTPATIENT_CLINIC_OR_DEPARTMENT_OTHER)
Admission: RE | Admit: 2011-01-22 | Discharge: 2011-01-22 | Disposition: A | Discharge status: Home / Self Care | Payer: Medicare Other | Source: Ambulatory Visit | Attending: Cardiology | Admitting: Cardiology

## 2011-01-22 DIAGNOSIS — R079 Chest pain, unspecified: Secondary | ICD-10-CM | POA: Insufficient documentation

## 2011-01-22 DIAGNOSIS — Z9861 Coronary angioplasty status: Secondary | ICD-10-CM | POA: Insufficient documentation

## 2011-01-22 DIAGNOSIS — R9439 Abnormal result of other cardiovascular function study: Secondary | ICD-10-CM | POA: Insufficient documentation

## 2011-01-22 DIAGNOSIS — Z538 Procedure and treatment not carried out for other reasons: Secondary | ICD-10-CM | POA: Insufficient documentation

## 2011-01-22 DIAGNOSIS — I2510 Atherosclerotic heart disease of native coronary artery without angina pectoris: Secondary | ICD-10-CM | POA: Insufficient documentation

## 2011-01-24 ENCOUNTER — Ambulatory Visit (HOSPITAL_COMMUNITY)
Admission: RE | Admit: 2011-01-24 | Discharge: 2011-01-25 | Disposition: A | Discharge status: Home / Self Care | Payer: Medicare Other | Source: Ambulatory Visit | Attending: Interventional Cardiology | Admitting: Interventional Cardiology

## 2011-01-24 DIAGNOSIS — I5030 Unspecified diastolic (congestive) heart failure: Secondary | ICD-10-CM | POA: Insufficient documentation

## 2011-01-24 DIAGNOSIS — I10 Essential (primary) hypertension: Secondary | ICD-10-CM | POA: Insufficient documentation

## 2011-01-24 DIAGNOSIS — E785 Hyperlipidemia, unspecified: Secondary | ICD-10-CM | POA: Insufficient documentation

## 2011-01-24 DIAGNOSIS — E119 Type 2 diabetes mellitus without complications: Secondary | ICD-10-CM | POA: Insufficient documentation

## 2011-01-24 DIAGNOSIS — I2510 Atherosclerotic heart disease of native coronary artery without angina pectoris: Secondary | ICD-10-CM | POA: Insufficient documentation

## 2011-01-24 LAB — GLUCOSE, CAPILLARY
Glucose-Capillary: 157 mg/dL — ABNORMAL HIGH (ref 70–99)
Glucose-Capillary: 131 mg/dL — ABNORMAL HIGH (ref 70–99)

## 2011-01-25 LAB — BASIC METABOLIC PANEL
Calcium: 9.1 mg/dL (ref 8.4–10.5)
Chloride: 102 mEq/L (ref 96–112)
Creatinine, Ser: 1.09 mg/dL (ref 0.4–1.5)
GFR calc Af Amer: >60 mL/min (ref >60)

## 2011-01-25 LAB — GLUCOSE, CAPILLARY: Glucose-Capillary: 125 mg/dL — ABNORMAL HIGH (ref 70–99)

## 2011-01-25 LAB — CBC
MCH: 29.5 pg (ref 26.0–34.0)
Platelets: 146 10*3/uL — ABNORMAL LOW (ref 150–400)
RBC: 4.51 MIL/uL (ref 4.22–5.81)
WBC: 9.1 10*3/uL (ref 4.0–10.5)

## 2011-02-04 NOTE — Cardiovascular Report (Signed)
[name], [name]                ACCOUNT NO.:  [id number]  MEDICAL RECORD NO.:  [id number]           PATIENT TYPE:  LOCATION:                                 FACILITY:  PHYSICIAN:  Armanda Magic, M.D.     DATE OF BIRTH:  December 10, 1929  DATE OF PROCEDURE:  01/22/2011 DATE OF DISCHARGE:                           CARDIAC CATHETERIZATION   REFERRING PHYSICIAN:  Lupita Raider, MD  PROCEDURES: 1. Left heart catheterization. 2. Coronary angiography. 3. Left ventriculography.  OPERATOR:  Armanda Magic, MD  INDICATIONS:  Chest pain and CAD.  COMPLICATIONS:  Inability to gain access into the right femoral artery.  IV MEDICATIONS:  None.  IV CONTRAST:  None.  HISTORY:  This is a 77 year old male with a history of CAD status post PCI of the RCA in June 2010 with catheter at that time also showing a 70% ramus branch, who has been having episodes of chest pain and underwent nuclear stress test which showed some mild perfusion defect in the apical wall with reversibility worrisome for possible ischemia.  DESCRIPTION OF PROCEDURE:  The patient was brought to the cardiac catheterization laboratory in a fasting nonsedated state.  Informed consent was obtained.  The patient was connected to continuous heart rate and pulse oximetry monitoring and intermittent blood pressure monitoring.  The right groin was prepped and draped in a sterile fashion.  Xylocaine 1% was used for local anesthesia.  Using the modified Seldinger technique, attempts at gaining access into the femoral artery were successful using the modified Seldinger technique through the needle but the wire could not be passed.  The needle was removed and repeat access was gained via the needle into the right femoral artery and using a Wholey wire, I was able to advance the [facility] wire into the distal aorta.  The needle was removed over the wire and a 4-French sheath was placed over the wire into the right femoral groin but I  could only pass the sheath halfway into the groin.  The sheath was removed over the guidewire and a 5-French sheath was attempted to be placed into the groin.  In the process of passing the sheath over the [facility] wire, the [facility] wire migrated back and then we could not advance the [facility] wire back up into the distal aorta.  The sheath and the wire were removed and manual compression was performed until adequate hemostasis was obtained.  It was decided at that time given the patient's morbid obesity and mainly obesity in his lower abdomen that it was safest to abort the catheter today and the patient will be brought back on January 24, 2011 for a left heart cath via a right radial artery approach per Dr. Eldridge Dace.  The patient has been instructed not to take his metformin until 48 hours after his procedure on Thursday.  He will continue his other medications now and he will be discharged to home after his IV fluid and bedrest are complete.     Armanda Magic, M.D.     TT/MEDQ  D:  01/22/2011  T:  01/23/2011  Job:  [id number]  cc:  Lupita Raider, M.D.  Electronically Signed by Armanda Magic M.D. on 02/04/2011 07:43:57 PM

## 2011-02-14 NOTE — Cardiovascular Report (Signed)
NAMEHARSHITH, PURSELL                ACCOUNT NO.:  [id number]  MEDICAL RECORD NO.:  [id number]           PATIENT TYPE:  O  LOCATION:  6531                         FACILITY:  [facility]  PHYSICIAN:  Corky Crafts, MDDATE OF BIRTH:  [birth year]  DATE OF PROCEDURE:  01/24/2011 DATE OF DISCHARGE:                           CARDIAC CATHETERIZATION   REFERRING PHYSICIAN:  Dr. Clelia Croft.  PRIMARY CARDIOLOGIST:  Armanda Magic, MD  PROCEDURES PERFORMED:  Left heart catheterization, left ventriculogram, coronary angiogram, and percutaneous coronary intervention of the ramus intermediate.  OPERATOR:  Corky Crafts, MD  INDICATIONS:  Abnormal stress test, shortness of breath.  PROCEDURE NARRATIVE:  The risks and benefits of cardiac catheterization were explained to the patient and informed consent was obtained.  He was brought to the cath lab.  He was prepped and draped in usual sterile fashion.  His right wrist was infiltrated with 1% lidocaine.  A 5-French glide sheath was placed into the right radial artery using modified Seldinger technique.  Right coronary artery angiography was performed using a JR-4 pigtail catheter.  The catheter was advanced to the vessel ostium under fluoroscopic guidance.  Digital angiography was performed in multiple projections using hand injection of contrast.  Left coronary artery angiography was initially attempted with a JL-3.5 catheter which has been unsuccessful.  A JL-4 catheter was successfully used to engage the left main.  Digital angiography was performed in a similar fashion. Pigtail catheter was then placed in the ascending aorta and across the aortic valve under fluoroscopic guidance.  Power injection of contrast was performed in the RAO projection to image the left ventricle.  The catheter was pulled back under continuous hemodynamic pressure monitoring.  The intervention was performed.  A CLS 3.5 catheter and subsequently a CLS 3.0 catheter  were initially used but could not engage the left main.  Finally, a JL-4 catheter easily engaged the left main. The intervention was performed.  Please see below for details.  Angiomax was used.  Effient was also given because the patient is intolerant to Plavix.  After the angioplasty, the sheath was removed and a TR band was used for hemostasis during the case.  Intracoronary nitroglycerin was used to treat vasospasm in the ramus, which was successful.  Verapamil and nitroglycerin were given in the radial artery to treat vasospasm in this vessel, successfully.  FINDINGS:  The right coronary artery is a medium-sized vessel.  The stents in the mid portion of the vessel were widely patent.  There is moderate diffuse disease proximally.  Posterolateral artery is a medium- sized vessel and widely patent.  The PDA branches early into essentially dual PDAs both of which were widely patent. The left circumflex has an aneurysmal segment.  It is an overall small vessel.  There is a small OM-1 arising from the aneurysmal segment. The ramus intermediate also arises from an aneurysmal segment off the distal left main.  In the proximal portion of the ramus, there is some moderate diffuse atherosclerosis approximately 30-40%, then a focal 90% stenosis occurred at the origin of a small branch.  The remainder of the ramus is a fairly  large vessel supplying the majority of the lateral wall. The left anterior descending is a large vessel to the apex.  There is mild diffuse atherosclerosis.  The first diagonal is small and patent. This vessel does wrap around the apex. The left ventriculogram showed normal left ventricular function with an estimated ejection fraction of 55%.  HEMODYNAMICS:  LV pressure 143/19 with an LVEDP of 29 mmHg.  Aortic pressure 141/78 with a mean aortic pressure of 102 mmHg.  PCI NARRATIVE:  A JL-4 guiding catheter was used to engage the left main as noted above.  A Prowater  wire was placed into the ramus.  A 2.5 x 12 apex balloon was used to predilate the lesion in the ramus.  A 2.5 x 20 PROMUS stent was then placed across the diseased area in the proximal to mid ramus.  There was still some distance left between the proximal edge of the stent and the aneurysmal segment at the origin of the ramus.  The stent was deployed a 14 atmospheres for 28 seconds.  The stent was postdilated with a 3.0 x 15 Grayling trek balloon inflated in the distal portion of the stent to 18 atmospheres for 24 seconds and the more proximal portion of the stent to 14 atmospheres.  There is no residual stenosis in the severely diseased area that was treated.  TIMI 3 flow was maintained throughout.  IMPRESSION: 1. Patent right coronary artery stent. 2. Severe disease in the mid ramus which was successfully stented with     a 2.5 x 20 PROMUS drug-eluting stent.  This was postdilated to     greater than 3 mm in diameter. 3. Normal left ventricular function. 4. Moderately increased LVEDP which likely contributes to shortness of     breath. 5. Please note that a JL-4 guiding catheter fit well in the left main     from the right radial as there was difficulty in finding a guide to     fit the left main from the radial approach.  ESTIMATED BLOOD LOSS:  50 mL.  CONTRAST:  Total 180 mL.  RECOMMENDATIONS:  Continue aspirin and Effient along with aggressive secondary prevention.  He will be watched overnight and hopefully discharged home tomorrow assuming no complications.     Corky Crafts, MD     JSV/MEDQ  D:  01/24/2011  T:  01/25/2011  Job:  [id number]  cc:   Armanda Magic, M.D. Dr. Clelia Croft  Electronically Signed by Lance Muss MD on 02/14/2011 01:19:49 PM

## 2011-02-14 NOTE — Discharge Summary (Signed)
  [name], [name]                ACCOUNT NO.:  [id number]  MEDICAL RECORD NO.:  [id number]           PATIENT TYPE:  O  LOCATION:  6531                         FACILITY:  [facility]  PHYSICIAN:  Corky Crafts, MDDATE OF BIRTH:  Nov 13, 1929  DATE OF ADMISSION:  01/24/2011 DATE OF DISCHARGE:  01/25/2011                              DISCHARGE SUMMARY   PRIMARY CARDIOLOGIST:  Armanda Magic, MD  FINAL DIAGNOSES: 1. Coronary artery disease. 2. Diastolic heart failure. 3. Shortness of breath. 4. Hyperlipidemia.  PROCEDURES PERFORMED:  Cardiac catheterization with drug-eluting stent implantation into the proximal to mid ramus intermedius.  HOSPITAL COURSE:  The patient underwent cardiac catheterization.  This revealed a 90% proximal-to-mid ramus intermedius lesion.  This underwent successful stenting with a drug-eluting stent, 2.5 x 20 Promus Element stent postdilated to greater than 3 mm in diameter.  The patient tolerated the procedure well.  It was done from the right radial approach since there were issues trying to obtain groin axis a few days ago.  He tolerated the procedure well.  He had no further chest pain or bleeding issues after the procedure.  He was found to have an increased LVEDP of 29 mmHg.  We decided that we start him on the diuretic after he was over the contrast load from the catheterization.  ACTIVITY:  Increase activity slowly.  No lifting more than 10 pounds. Follow post-radial cath instructions as well.  DIET:  Low-sodium, heart-healthy diet.  FOLLOWUP APPOINTMENT:  With Armanda Magic, MD on February 08, 2011, at 9:45 a.m.  DISCHARGE MEDICATIONS: 1. Furosemide 40 mg daily.  He is to start this on January 26, 2011,     this is a new medication. 2. Aspirin 325 mg daily. 3. Diazepam 5 mg as needed. 4. Effient 10 mg daily. 5. Glimepiride 2 mg daily. 6. Imodium p.r.n. 7. Imdur 60 mg daily. 8. Levothyroxine 175 mcg daily. 9. Metformin 1 g b.i.d., he is to  restart this on January 26, 2011. 10.Metoprolol 25 mg b.i.d. 11.Sublingual nitroglycerin p.r.n. 12.Percocet. 13.Symbicort. 14.Vytorin 10/80 one tablet daily. 15.Xalatan.  LABS:  At the time of discharge, creatinine 1.09, potassium 4.1, hemoglobin 13.3.     Corky Crafts, MD     JSV/MEDQ  D:  01/25/2011  T:  01/25/2011  Job:  [id number]  cc:   Armanda Magic, M.D. Lupita Raider, M.D.  Electronically Signed by Lance Muss MD on 02/14/2011 01:17:36 PM

## 2011-02-26 NOTE — Procedures (Signed)
AORTA-ILIAC DUPLEX EVALUATION   INDICATION:  Followup, left common iliac artery PTA and stent.   HISTORY:  Diabetes:  Yes, on oral medication.  Cardiac:  No.  Hypertension:  No.  Smoking:  No, quit 30 years ago.  Previous Surgery:  Left common iliac artery PTA and stent on November 20, 2006 by Dr. Hart Rochester.               SINGLE LEVEL ARTERIAL EXAM                              RIGHT                  LEFT  Brachial:                  140                    134  Anterior tibial:           48                     100  Posterior tibial:          98                     Inaudible  Peroneal:                                         Inaudible  Ankle/brachial index:      0.70                   0.71  Previous ABI/date:         04/14/2007, 0.72       04/14/2007, 0.73   AORTA-ILIAC DUPLEX EXAM  Aorta - Proximal  Aorta - Mid  Aorta - Distal   RIGHT                                   LEFT                    CIA-PROXIMAL          124 cm/s                    CIA-DISTAL            172 cm/s                    HYPOGASTRIC                    EIA-PROXIMAL          96 cm/s                    EIA-MID                    EIA-DISTAL   IMPRESSION:  1. Study technically difficult due to body habitus.  2. Left common iliac artery stent patent.  3. Ankle brachial indices are stable bilaterally.   ___________________________________________  Quita Skye Hart Rochester, M.D.   DP/MEDQ  D:  10/27/2007  T:  10/27/2007  Job:  [id number]

## 2011-02-26 NOTE — Cardiovascular Report (Signed)
NAMELYDELL, MOGA NO.:  [id number]   MEDICAL RECORD NO.:  [id number]          PATIENT TYPE:  INP   LOCATION:  2508                         FACILITY:  [facility]   PHYSICIAN:  Lyn Records, M.D.   DATE OF BIRTH:  12/16/1929   DATE OF PROCEDURE:  04/11/2009  DATE OF DISCHARGE:                            CARDIAC CATHETERIZATION   INDICATION FOR PROCEDURE:  The patient presents with atypical symptoms,  had a positive Cardiolite, and resultantly a greater than 90% segmental  stenosis involving the proximal and mid RCA was noted.  There was a  borderline 60-75% proximal first obtuse marginal stenosis.  The  patient's Cardiolite study demonstrated inferior wall ischemia.   PROCEDURE PERFORMED:  Drug-eluting stent proximal/mid right coronary  artery.   DESCRIPTION:  Dr. Mayford Knife performed the cardiac catheterization  immediately prior to the PCI.  The 5-French sheath was placed by her.  She noted that the patient had tortuosity at the aortoiliac region.  We  upgraded without difficulty to a 6-French sheath in the right femoral  artery over guidewire.  We then advanced a JR-4 6-French guide catheter  to the right coronary ostium and obtain guiding shots.  We obtained  guiding shot images as road maps for the procedure.  The patient  received a bolus followed by an infusion of Angiomax.  He received 0.75  mg/kg of IV bolus followed by 1.75 mg/kg/hour infusion.  ACT was  documented to be greater than 300.  The patient has previously been  exposed to Plavix and had intractable pruritus.  Because of this,  Effient was used.  He was loaded with 60 mg followed by the Angiomax as  noted above.   We then performed an angioplasty on the mid right coronary.  Because of  very eccentric lesion in the mid right coronary and a dissection plane,  we were unable to pass a BMW wire.  We subsequently used an Marketing executive and we were able to gently position the wire to traverse  the eccentric lesion in the mid right coronary and then down into the  distal vessel.  Predilatation was performed with a 2.5 x 20-mm long apex  balloon.  We then deployed a 36-mm long x 3.0-mm diameter TAXUS drug-  eluting stent.  This stent was deployed to 14 atmospheres.  Two balloon  inflations were performed.  We then postdilated using a 3.25 x 20-mm  long Quantum apex to 14 atmospheres throughout the length of the stent.  In the proximal one fourth of the stent, we used a 3.75 x 10-mm long  Quantum apex balloon to 16 atmospheres.  The patient tolerated the  procedure without complications.   A diagnostic image of the left coronary system was made using a guiding  catheter and after reviewing the obtuse marginal in several views, we  decided to take a pass on dilating this vessel, choosing medical therapy  instead.   CONCLUSIONS:  Successful stent using a 36-mm long TAXUS 3.0 drug-eluting  stent.  The stent was postdilated to 3.25 and 3.75 from distal to  proximal.  Brisk distal flow was noted.  No complications occurred.   PLAN:  Dr. Armanda Magic manages this patient.  Effient will be used as  the long term thienopyridine.      Lyn Records, M.D.  Electronically Signed     HWS/MEDQ  D:  04/11/2009  T:  04/12/2009  Job:  [id number]   cc:   Armanda Magic, M.D.

## 2011-02-26 NOTE — H&P (Signed)
[name], [name]                ACCOUNT NO.:  [id number]   MEDICAL RECORD NO.:  [id number]          PATIENT TYPE:  INP   LOCATION:  0103                         FACILITY:  [facility]   PHYSICIAN:  Hollice Espy, M.D.DATE OF BIRTH:  [birth year]   DATE OF ADMISSION:  07/23/2007  DATE OF DISCHARGE:                              HISTORY & PHYSICAL   PRIMARY CARE PHYSICIAN:  Donia Guiles, MD   CHIEF COMPLAINT:  Shortness of breath.   HISTORY OF PRESENT ILLNESS:  The patient is a 77 year old African  American male with past medical history of hypothyroidism, diabetes  mellitus, and was diagnosed with a pneumonia one day prior as an  outpatient and started on p.o. antibiotics, who came into the emergency  room complaining of shortness of breath.  The patient had been  previously well but then earlier on in the week he was noticing some  shortness of breath and mild cough, nonprogressive.  He came in to see  his PCP's partner, L. Lupe Carney, MD, who started the patient on  outpatient Cipro.  Over the course of the next 24 hours the patient  complained of progressively worsening shortness of breath and became  concerned and came into the emergency room for further evaluation.  In  the emergency room he was found to have a white count of 15.8.  He was  noted to be slightly tachycardic with a heart rate of 127 and a  temperature of 103 and he it took 6 L for the patient to saturate 98% on  room air.  With these findings it was felt best that the patient come in  for further evaluation.  The patient was given a dose of Rocephin and  Zithromax in the emergency room and started on nasal oxygen.  He says he  is feeling a little bit better but still complains of some shortness of  breath and weakness.   He denies any headaches, vision changes, no dysphagia, no chest pain or  palpitations, no wheezing.  He does complain of mild cough.  No  abdominal pain.  No hematuria, dysuria, constipation  or diarrhea.  No  focal extremity weakness, numbness or pain.  Review of systems otherwise  negative.   PAST MEDICAL HISTORY:  1. Hypothyroidism.  2. Diabetes mellitus.  3. Hyperlipidemia.   MEDICATIONS:  1. Synthroid 200 mcg p.o. daily.  2. Aspirin 325 mg.  3. Glyburide 1 mg.  4. Nabumetone 750 mg b.i.d.  5. Vytorin 10/80 mg.   He has no known drug allergies.   SOCIAL HISTORY:  He denies any tobacco, alcohol or drug use.   FAMILY HISTORY:  Noncontributory.   PHYSICAL EXAM:  VITALS ON ADMISSION:  Temperature 103, heart rate 127,  blood pressure 127/71, respirations 32, O2 saturation 98% on 6 L.  GENERAL:  He is alert and oriented x3, in some mild distress secondary  to breathing.  HEENT:  Normocephalic, atraumatic.  His mucous membranes are slightly  dry.  He has a narrow airway.  HEART:  Regular rate and rhythm.  S1, S2, mild tachycardia.  LUNGS:  He has some decreased breath sounds at the bases, also limited  by body habitus.  ABDOMEN:  Soft, obese, nontender, positive bowel sounds.  EXTREMITIES:  No clubbing, cyanosis, or edema.   LAB WORK:  I have ordered a chest x-ray, it is pending.  Sodium 134,  potassium 3.8, chloride 95, bicarb 29, BUN 12, creatinine 1.1, glucose  169.  White count 15.8, H&H 12 and 35, MCV of 88, platelet count 173.   ASSESSMENT AND PLAN:  1. Pneumonia with worsening symptoms.  I am bringing the patient in      for IV antibiotics, oxygen and nebulizer treatments, follow his      white count, follow up on chest x-ray.  2. Diabetes mellitus.  Continue glyburide, put on a sliding scale.  3. Hypothyroidism.  Continue Synthroid.      Hollice Espy, M.D.  Electronically Signed     SKK/MEDQ  D:  07/23/2007  T:  07/23/2007  Job:  [id number]   cc:   Donia Guiles, M.D.  Fax: [phone]

## 2011-02-26 NOTE — Consult Note (Signed)
[name], [name]                ACCOUNT NO.:  [id number]   MEDICAL RECORD NO.:  [id number]          PATIENT TYPE:  OBV   LOCATION:  1426                         FACILITY:  [facility]   PHYSICIAN:  Armanda Magic, M.D.     DATE OF BIRTH:  Mar 28, 1930   DATE OF CONSULTATION:  04/06/2009  DATE OF DISCHARGE:                                 CONSULTATION   REFERRING PHYSICIAN:  [employer] practitioner.   PRIMARY PHYSICIAN:  Donia Guiles, MD and Danise Edge, MD   CHIEF COMPLAINT:  Chest pain.   HISTORY OF PRESENT ILLNESS:  This is a 77 year old male with no history  of prior coronary artery disease.  He does have a history of Cardiolite  and echo in 2006 which was normal.  He presents with complaints of 2-day  history of intermittent sharp chest pain beneath the left breast lasting  a few hours.  He says the pain is worse when he bends over.  The pain is  now resolved.  He has noted some dyspnea on exertion and shortness of  breath when he goes outside in the hot.  This has been going on for  about 2 weeks.  EKG on admission to the ER was not acute.  Point-of-care  enzymes are negative x1.  Currently, he is asymptomatic.   PAST MEDICAL HISTORY:  Diabetes mellitus, glaucoma, hypothyroidism,  dyslipidemia, and obesity.   ALLERGIES:  PLAVIX which made him feel so in the past.   MEDICATIONS:  1. Glimepiride 1 mg daily.  2. Xalatan eye drops.  3. Vytorin 10/80 mg daily.  4. Levothroid 200 mcg daily.  5. He was on baby aspirin, but Dr. Laural Benes took him off it.   He recently had endoscopy and colonoscopy in January 2010 that was  negative.   SOCIAL HISTORY:  He lives in [place].  He denies any tobacco or  alcohol use.  No IV drug use.   FAMILY HISTORY:  There is no history of CAD.   REVIEW OF SYSTEMS:  Otherwise as stated in the HPI is negative.   PHYSICAL EXAMINATION:  VITAL SIGNS:  Blood pressure is 133/84, pulse 92,  and respirations 28.  He is afebrile.  O2 saturations  98% on 2 L.  GENERAL:  This is a well-developed, well-nourished white male in no  acute distress.  HEENT:  Benign.  NECK:  Supple without lymphadenopathy.  Carotid upstrokes are +2  bilaterally.  No bruits.  LUNGS:  Clear to auscultation throughout.  HEART:  Regular rate and rhythm.  No murmurs, rubs, or gallops.  Normal  S1 and S2.  ABDOMEN:  Benign.  EXTREMITIES:  Trace pedal edema.   Chest x-ray shows stable granuloma of the left lung.   LABORATORY DATA:  Sodium 138, potassium 4.2, chloride 100, bicarb 29,  BUN 15, creatinine 1, and glucose 132.  White cell count 6.9, hemoglobin  15.1, and hematocrit 47.5.  Lipase 20.  D-dimer 0.29.  EKG showed normal  sinus rhythm with no ST changes.   ASSESSMENT:  1. Atypical chest pain with normal EKG and cardiac enzymes negative  x1.  Recommend cycling cardiac enzymes; if positive, then we will      proceed with heart cath.  The patient's pain is atypical and his      only cardiac risk factor is his age, sex, and diabetes.  He also      has a history of dyslipidemia.  If his cardiac enzymes are all      normal, we will discharge him to home with scheduling of an      outpatient Cardiolite.  We will also see what the echo results are.  2. Diabetes mellitus.  3. Dyslipidemia.  4. Obese.  5. Hypothyroidism.  6. Thrombocytopenia.      Armanda Magic, M.D.  Electronically Signed     TT/MEDQ  D:  04/06/2009  T:  04/07/2009  Job:  [id number]   cc:   Danise Edge, M.D.

## 2011-02-26 NOTE — Assessment & Plan Note (Signed)
OFFICE VISIT   [name], SCIARRA  DOB:  [birth year]                                       10/09/2010  ZOXWR#:[id number]   The patient returned today for the same complaints that he shared with  me November 1 of this year when I evaluated him.  He states he continues  to have a catch in his left hip and thigh when he walks.  Frequently  this will occur after just a few steps and sometimes does not occur at  off.  It is usually improved by rest.  He has a remote history of iliac  angioplasty and stenting in February 2008 by me, and that has remained  stable.  Review of his ABIs at the last visit reveals that they have not  changed significantly over the last few years, 0.63 on the right and  0.59 on the left.  He has known superficial femoral occlusive disease  bilaterally.   On exam today blood pressure is 149/65, heart rate is 87, respirations  14.  General:  He is an obese and well-developed male who is in no  apparent distress, alert and oriented x3.  Abdomen is obese.  No  palpable masses.  Musculoskeletal exam is free of major deformities.  Neurologic exam is normal.  Lower extremity exam reveals 3+ femoral  pulse on the right, 2+ femoral pulse on the left, with no popliteal or  distal pulses.  Both feet are well-perfused.   I do not think his symptoms are vascular in nature because of the  inconsistent distances he walks when this occurs, sometimes after a few  steps, sometimes not occurring at all.  It sounds more like an  orthopedic joint issue.  If he continues to have severe problems and all  other areas have been ruled out we could perform an angiogram to look  for iliac disease or restenosis in the stent, but I doubt that this is  the etiology of his problems.  We will continue to follow him on a  protocol.     Quita Skye Hart [place], M.D.  Electronically Signed   JDL/MEDQ  D:  10/09/2010  T:  10/10/2010  Job:  [id number]

## 2011-02-26 NOTE — Op Note (Signed)
[name], [name]                ACCOUNT NO.:  [id number]   MEDICAL RECORD NO.:  [id number]          PATIENT TYPE:  AMB   LOCATION:  ENDO                         FACILITY:  [facility]   PHYSICIAN:  Danise Edge, M.D.   DATE OF BIRTH:  Jan 16, 1930   DATE OF PROCEDURE:  10/21/2007  DATE OF DISCHARGE:                               OPERATIVE REPORT   PROCEDURE:  Esophagogastroduodenoscopy.   PROCEDURE INDICATION:  [name] is a 77 year old male born  March 28, 1930.  [name] is undergoing esophagogastroduodenoscopy to  evaluate intermittent solid food and liquid dysphagia.  He is also  experiencing bloating, halitosis, and sinus drainage.   In 1998 his esophagogastroduodenoscopy with small-bowel biopsies were  normal.   In 2005 his esophagogastroduodenoscopy was normal except for the  presence of a small gastric antral polyp which was biopsied and showed  gastritis, but no H. pylori.   On July 06, 2007 his barium tablet/liquid barium swallow and upper  GI x-ray series was normal.   CHRONIC MEDICATIONS:  Synthroid, aspirin, glyburide, Vytorin.   PAST MEDICAL-SURGICAL HISTORY:  1. Hypothyroidism.  2. Type 2 diabetes mellitus.  3. Hyperlipidemia.  4. Sleep apnea syndrome.  5. Aortoiliac and femoral popliteal peripheral artery disease.   ENDOSCOPIST:  Danise Edge, M.D.   PREMEDICATION:  Fentanyl 50 mcg, Versed 5 mg.   DESCRIPTION OF PROCEDURE:  After obtaining informed consent [name]  was placed in the left lateral decubitus position.  I administered  intravenous fentanyl and intravenous Versed to achieve conscious  sedation for the procedure.  The patient's blood pressure, oxygen  saturation, and cardiac rhythm were monitored throughout the procedure,  and documented in the medical record.   The Pentax gastroscope was passed through the posterior hypopharynx into  the proximal esophagus without difficulty.  The hypopharynx, larynx, and  vocal cords  appeared normal.   ESOPHAGOSCOPY:  The proximal mid and lower segments of the esophageal  mucosa appear normal.  The squamocolumnar junction and esophagogastric  junction are noted at 45 cm from the incisor teeth.  There is no  endoscopic evidence for the presence of erosive esophagitis, Barrett's  esophagus, esophageal mucosal stricture formation, or esophageal  obstruction.   GASTROSCOPY:  Retroflex view of the gastric cardia and fundus is normal.  The gastric body, antrum and pylorus appear normal.  There is a 5 mm  gastric antral polyp was which was biopsied.  This polyp has not changed  since his 2005 esophagogastroduodenoscopy.   DUODENOSCOPY:  There are scattered punctate erosions in the duodenal  bulb, probably secondary to aspirin.  The descending duodenum appeared  normal.   ASSESSMENT:  1. Chronic small gastric antral polyp which has not changed since it      was first identified in 2005 by esophagogastroduodenoscopy.  2. Scattered erosions in the duodenal bulb, probably secondary to      aspirin.  3. No esophageal abnormality identified to explain his intermittent      solid and liquid dysphagia.   RECOMMENDATIONS:  I suspect [name] has a nonspecific esophageal  motility disorder, causing  his intermittent solid and liquid dysphagia.  His gastric antral polyp has not changed in the last 3 years, and  biopsies have been repeated.   If he is experiencing acid reflux symptoms, I would recommend proton  pump inhibitor therapy.  He really does not describe heartburn or  regurgitation.  I do not have any good suggestions to treat his  intermittent solid and liquid dysphagia which does not sound like it is  a significant problem.           ______________________________  Danise Edge, M.D.     MJ/MEDQ  D:  10/21/2007  T:  10/21/2007  Job:  [id number]   cc:   Donia Guiles, M.D.

## 2011-02-26 NOTE — Assessment & Plan Note (Signed)
OFFICE VISIT   [name], [name]  DOB:  1930-03-18                                       04/14/2007  EAVWU#:[id number]   The patient underwent angioplasty and stenting of his left common iliac  artery in February of this year with a good initial result, improving  his claudication symptoms significantly.  Over the past 3 months he has  developed a symptom in his left inguinal region and left hip where he  will be walking with no symptoms and suddenly develop severe pain in his  inguinal area, gives out as if he has a sharp pain or instability in  this area, and this then resolves.  He does not have thigh or calf  discomfort, or numbness associated with this.  Right leg is not  involved.  He has had no TIAs either hemispheric or non-hemispheric, and  denies any cardiac symptoms.   PHYSICAL EXAM:  He has excellent femoral pulses bilaterally with well-  perfused lower extremities.  His ABI are stable from previous exams with  0.72 on the right and 0.73 on the left.  Actually improved from March of  this year.   I reassured him regarding these findings.  I feel that this is  definitely not a vascular symptom, but may well be related to his hip  joint or some other musculoskeletal issues in that area.  We would like  Dr. Lajoyce Corners to give his evaluation of this.  He will return to see [place] on  protocol for his PTA and stents.   Quita Skye Hart [place], M.D.  Electronically Signed   JDL/MEDQ  D:  04/14/2007  T:  04/15/2007  Job:  [id number]   cc:   Donia Guiles, M.D.  Nadara Mustard, MD

## 2011-02-26 NOTE — Discharge Summary (Signed)
[name], [name]                ACCOUNT NO.:  [id number]   MEDICAL RECORD NO.:  [id number]          PATIENT TYPE:  INP   LOCATION:  1431                         [facility]ACILITY:  [facility]   PHYSICIAN:  Ramiro Harvest, MD    DATE OF BIRTH:  20-Nov-1929   DATE OF ADMISSION:  01/05/2008  DATE OF DISCHARGE:  01/07/2008                               DISCHARGE SUMMARY   PRIMARY CARE PHYSICIAN:  Donia Guiles, M.D.   DISCHARGE DIAGNOSES:  1. Acute asthma exacerbation.  2. Type 2 diabetes.  3. Hypothyroidism.  4. Hyperlipidemia.  5. Peripheral vascular disease.  6. History of obstructive sleep apnea.   DISCHARGE MEDICATIONS:  1. Advair 100/50 1 puff b.i.d.  2. Albuterol MDIs 2 puffs q.8 h. x5 days, then every 4-6 hours as      needed.  3. Prednisone 40 mg p.o. daily x2 days; then 20 mg p.o. daily x2 days;      then 10 mg p.o. daily x2 days; then off.  4. Aspirin 325 mg p.o. daily.  5. Glimepiride 1 mg p.o. daily.  6. Levothyroxine 200 mcg p.o. daily  7. Vytorin 10/80 p.o. daily.  8. Tylenol as needed.   DISPOSITION/FOLLOW UP:  The patient will be discharged home to follow up  with PCP in 1-2 weeks.  On followup, the patient's asthma needs to be  reassessed.  The patient may likely benefit from pulmonary function  tests if not done.  Basic metabolic profile needs to be checked to  follow up on the patient's electrolytes.  Also the patient may need a  formal sleep study as the patient does have a questionable history of  obstructive sleep apnea if not already done.  CBC needs to be obtained  to follow the patient's white count as his steroids are tapered down.   CONSULTATIONS:  None.   PROCEDURES:  1. A chest x-ray was done on January 05, 2008 that showed no active      cardiopulmonary disease.  2. A 2-D echo was obtained on January 06, 2008 which showed an overall      left ventricular systolic function was normal, EF 60-65%.  No      diagnostic evidence of left ventricular regional wall  motion      abnormalities.  Left ventricular wall thickness moderately      increased.  Features were consistent with mild diastolic      dysfunction.  Aortic valve was mildly calcified.   ADMISSION HISTORY/PHYSICAL:  [name] is a 77 year old gentleman  with past medical history significant for diabetes and pneumonia in  October 2008.  The patient reports that since that time, he has been  having some worsening shortness of breath and wheezing.  He was started  on albuterol inhaler with a lot of improvement and eventually was  diagnosed with asthma, although it was not clear if he ever had true  PFTs done.  The patient was feeling well until a few days prior to  admission.  The patient had gotten a mild cold.  Then 3 days prior to  admission, he started  to have worsening shortness of breath and wheezing  worse than his baseline.  The patient never uses oxygen at home, but  when he presented to the ED, he was noted to be slightly hypoxic satting  90% on room air.  He was placed on 2 liters oxygen and was satting  between 92-95%.  At that point, the patient was thought to be in need  for admission and the [facility] hospitalists were called.   PHYSICAL EXAMINATION:  VITAL SIGNS:  Per admitting physician:  Temperature 98.9, blood pressure 127/70, pulse of 104, respirations 22,  satting 98% on room air.  GENERAL:  The patient appeared to be in no acute distress.  No use of  accessory muscles of respiration.  HEENT:  Normocephalic, atraumatic.  Pupils equal, round and reactive to  light.  Extraocular movements intact.  Oropharynx was moist with moist  mucous membranes.  NECK:  Supple.  No lymphadenopathy.  RESPIRATORY:  Wheezes bilaterally.  CARDIOVASCULAR:  Tachycardiac, regular rhythm.  No murmurs, rubs or  gallops.  ABDOMEN:  Obese, soft, nontender and nondistended.  Positive  bowel sounds.  EXTREMITIES:  Trace bilateral lower extremity edema.  NEUROLOGIC:  Unremarkable.    LABORATORY DATA:  White count 9.0, hemoglobin 14.6, platelets 146,  hematocrit 42.8, ANC 5.7.  BMET:  Sodium 138, potassium 3.9, chloride  101, bicarb 31, BUN 12, creatinine 1.0, glucose 155, calcium 9.83, D-  dimer 0.52.  CBGs ranged from 102-129.  Discharge labs BMET:  Sodium  139, potassium 4.0, chloride 101, bicarb 33, glucose 123, BUN 18,  creatinine 1.00, calcium 9.5.  CBC on day of discharge:  White count  16.9, hemoglobin 14.3, hematocrit 42.2, platelet count of 146.  The  patient's leukocytosis on day of discharge was likely secondary to the  patient's steroid use.  The patient was afebrile and there was no need  for any antibiotics.   HOSPITAL COURSE:  1. Acute asthma exacerbation.  The patient was admitted for an acute      asthma exacerbation.  He was placed on IV Solu-Medrol.  Placed on      Advair with albuterol and Atrovent nebs. Chest x-ray was obtained      with results as stated above.  A D-dimer was obtained which was      also just barely elevated.  The patient did have a low probability      for PE.  The patient's symptoms improved rapidly during the      hospitalization.  The patient was then changed from IV Solu-Medrol      to p.o. prednisone.  The patient was also hydrated with IV fluids.      The patient improved clinically and was back to baseline by the      time of discharge. The patient will be discharged home with Advair,      albuterol and Atrovent MDIs as well and prednisone taper.  Follow      up with primary care physician in the next 1-2 weeks.  2. Type 2 diabetes, stable.  Hemoglobin A1c was checked during the      hospitalization.  The patient's hemoglobin A1c was 6.7.  The      patient was maintained on his home dose of Amaryl as well as his      sliding-scale insulin.  3. Hypothyroidism, stable throughout the hospitalization.  A TSH was      checked which was 0.398.  The patient was maintained on his home  dose of levothyroxine.  4. The rest  of the patient's chronic medical issues were stable      throughout the hospitalization.  The patient will be discharged in      stable and improved condition.   Vital signs on day of discharge:  Temperature 98.2, pulse of 89,  respirations 20, blood pressure 117/73, satting 98% on 2 liters.   It was a pleasure taking care of [name].      Ramiro Harvest, MD  Electronically Signed     DT/MEDQ  D:  01/07/2008  T:  01/07/2008  Job:  [id number]   cc:   Donia Guiles, M.D.  Fax: [phone]

## 2011-02-26 NOTE — Cardiovascular Report (Signed)
NAMERENAUD, CELLI                ACCOUNT NO.:  [id number]   MEDICAL RECORD NO.:  [id number]          PATIENT TYPE:  INP   LOCATION:  2508                         FACILITY:  [facility]   PHYSICIAN:  Armanda Magic, M.D.     DATE OF BIRTH:  09/17/1930   DATE OF PROCEDURE:  04/11/2009  DATE OF DISCHARGE:                            CARDIAC CATHETERIZATION   REFERRING PHYSICIAN:  Dr. Janee Morn with [facility].   PROCEDURE:  Left heart catheterization, coronary angiography, and left  ventriculography.   OPERATOR:  Armanda Magic, MD   INDICATIONS:  Chest pain, abnormal Cardiolite.   COMPLICATIONS:  None.   IV ACCESS:  Via right femoral artery 5-French sheath.   IV MEDICATIONS:  Versed 2 mg and fentanyl 50 mcg IV.   This is a 77 year old male, who has a history of diabetes mellitus and  peripheral vascular disease, who presents with episodes of chest pain,  atypical in nature and ruled out for myocardial infarction.  He did have  nuclear stress test, which showed a fixed defect in the inferior wall  with a small amount of reversibility.  The patient now presents for  cardiac catheterization.   The patient was brought to the cardiac catheterization laboratory in a  fasting nonsedated state.  Informed consent was obtained.  The patient  was connected to continuous heart rate and pulse oximetry monitoring and  intermittent blood pressure monitoring.  The right groin was prepped and  draped in sterile fashion.  Xylocaine 1% was used for local anesthesia.  Using the modified Seldinger technique, a 5-French sheath was placed in  the right femoral artery.  Under fluoroscopic guidance, a guidewire was  attempted, past into the distal aorta, but was unsuccessful.  A  guidewire was exchanged out for a Wholey wire, which was able to be  placed under fluoroscopic guidance into the distal aorta and advanced  into the aortic root.  A 5-French JL4 catheter was placed in left  coronary artery.   Multiple cine films were taken at 30-degree RAO and 40-  degree LAO views.  This catheter was then exchanged out over a long  exchange wire for a 5-French JR4 catheter, which was placed in the right  coronary artery.  Multiple cine films were taken at 30-degree RAO and 40-  degree LAO views.  This catheter was then exchanged out over a long  guidewire for a 5-French angled pigtail catheter, which was placed under  fluoroscopic guidance in the left ventricular cavity.  Left  ventriculography was performed in the 30-degree RAO view using a total  of 25 mL of contrast at 12 mL per second.  The catheter was then pulled  back across the aortic valve with no significant gradient noted.  At the  end of procedure, the catheter was removed over a guidewire.  The  patient subsequently went on to PCI of the RCA.   RESULTS:  The left main coronary artery is long and widely patent and  trifurcates in the left anterior descending artery, ramus branch, and  left circumflex artery.  The left anterior descending artery is  widely  patent throughout its course to the apex and gives rise to 2 small  diagonal branches, both of which are widely patent.   The ramus branch has a proximal 70% eccentric lesion and is a large  branch.  It bifurcates into 2 daughter vessels.  The superior branch  then bifurcates into 2 daughter vessels, all of which are widely patent.  The inferior branch is widely patent as well and bifurcates into 2  daughter branches distally, which supply the entire posterior lateral  wall of the heart.   Left circumflex is small, but widely patent and traverses the AV groove.  It gives rise to 2 very small obtuse marginal branches, all of which are  widely patent.   The right coronary artery is diffusely diseased proximally with  sequential 90% stenosis proximally and in the proximal to midportion.  The ongoing RCA traverses the inferior wall and bifurcates into  posterior descending  artery and posterior lateral artery, both of which  are widely patent.   Left ventriculography shows normal LV function, EF 55%, left ventricular  pressure 121/20 mmHg, aortic pressure 117/65 mmHg, LVEDP 18-22 mmHg.   ASSESSMENT:  1. One-vessel obstructive coronary artery disease of the right      coronary artery with borderline disease of the ramus.  2. Normal left ventricular function.  3. Peripheral vascular disease with left iliac stent.  4. Diabetes mellitus.  5. Obesity.   PLAN:  Review films with Dr. Katrinka Blazing, probable PCI of the RCA plus or  minus intermediate.  We will start aspirin.  The patient gives a history  of an allergy to PLAVIX, but in questioning the patient further, he has  never had anaphylaxis or a rash, he just made a skin itch.  Films were  reviewed with Dr. Katrinka Blazing, and he felt that we should proceed with  angioplasty and stenting of the RCA with a DES stent since he is  diabetic.  We will start out with Plavix.  If he has problems with  itching again, we will switch him to Effient.      Armanda Magic, M.D.  Electronically Signed     TT/MEDQ  D:  04/11/2009  T:  04/11/2009  Job:  [id number]   cc:   Danise Edge, M.D.

## 2011-02-26 NOTE — Discharge Summary (Signed)
NAMEJAYDE, [name]                ACCOUNT NO.:  [id number]   MEDICAL RECORD NO.:  [id number]          PATIENT TYPE:  INP   LOCATION:  1426                         [facility]AC[facility]   PHYSICIAN:  Armanda Magic, M.D.     DATE OF BIRTH:  Dec 25, 1929   DATE OF ADMISSION:  04/06/2009  DATE OF DISCHARGE:  04/11/2009                               DISCHARGE SUMMARY   DISCHARGE DIAGNOSES:  1. Chest pain, resolved.  2. Coronary artery disease, status post drug-eluting stent to the      right coronary artery.  3. Diabetes mellitus.  4. Hyperlipidemia.  5. Hypothyroidism, on Synthroid.  6. History of thrombocytopenia.  7. History of hypertension.   HOSPITAL COURSE:  [name] is a 77 year old male patient that has  known history of coronary artery disease who tells that he had a  Cardiolite and echocardiogram in 2006 which was normal.  He now  complains of a 2-day history of intermittent sharp chest pain that lasts  for several hours.  He has shortness of breath and dyspnea on exertion.  His breathlessness has been going on for 2 weeks.  He presented to the  [facility] ER and EKG was nonacute. Initial point of markers were negative.   His cardiac enzymes were negative x3.  Total cholesterol 139,  triglycerides 108, HDL 33, and LDL 84.  Hemoglobin 15.4, hematocrit  46.2, BUN 16, and creatinine 1.05.  He had a 2-D echo, it showed LV  cavity was normal, his ejection fraction was 50-55% with regional wall  motion abnormalities, cannot exclude moderate hypokinesis of the distal  inferior myocardium.  We then did a nuclear stress test on this patient  and this showed mild global hypokinesis with an EF around 45%.  There  was inferoapical ischemia.   At this point, we felt it was prudent to take the patient to the Cardiac  Catheterization Lab.  This was scheduled for April 11, 2009, and the  patient was found to have a 90% right coronary artery lesion.  Dr. Katrinka Blazing  then implanted a drug-eluting stent  to this area without difficulty.  The patient has a history of itching with Plavix, therefore, they were  put on Effient instead.   The patient remained in the hospital overnight and was ready for  discharge the following day.   DISCHARGE MEDICATIONS:  1. Dicyclomine p.r.n.  2. Levothyroxine 200 mcg a day.  3. Vytorin 1 a day.  4. Lasix 20 mg a day.  5. Prasugrel 10 mg a day.  6. Enteric-coated aspirin 325 mg a day.  7. Lopressor 25 mg twice a day.  8. Amaryl 1 mg daily.  9. Xalatan eye drops as before.   He is being discharged to home in stable but improved condition.  He  will remain on a low-sodium, heart-healthy diabetic diet.  Clean cath  site gently with soap and water.  No scrubbing.  Increase activity  slowly.  No lifting over 10 pounds for 1 week.  No driving for 2 days.  Follow up with Dr. Mayford Knife on April 26, 2009, at 10:15  a.m.      Guy Franco, P.A.      Armanda Magic, M.D.  Electronically Signed    LB/MEDQ  D:  04/12/2009  T:  04/12/2009  Job:  [id number]   cc:   Danise Edge, M.D.

## 2011-02-26 NOTE — Consult Note (Signed)
NEW PATIENT CONSULTATION   [name], [name]  DOB:  [birth year]                                       08/14/2010  ZOXWR#:[id number]   Patient is a 77 year old male patient who has a known history of  peripheral vascular disease, having undergone a left iliac angioplasty  and stent procedure by me in February 2008.  At that time he was known  to have distal superficial femoral and popliteal occlusive disease,  which was not treated.  Since that time, his ambulation has been fairly  stable but he has recently developed a catch in the left medial  proximal thigh area when he arises.  This has not worsened with  ambulation but does limit his walking somewhat.  He also has severe calf  symptoms after walking about 1 block.  He develops dyspnea on exertion,  which is also a limiting factor.  He has no history of rest pain,  nonhealing ulcers, infection, or cellulitis.   CHRONIC MEDICAL PROBLEMS:  1. Diabetes.  2. Hyperlipidemia.  3. Coronary artery disease.  Previous PTCA and stenting.  4. Reflux esophagitis.  5. COPD.  Uses oxygen and CPAP at night.   SOCIAL HISTORY:  Patient is widowed and has 4 children.  Is retired.  Does not use tobacco or alcohol.  Quit smoking 40 years ago.   FAMILY HISTORY:  Positive for coronary artery disease in 2 sisters.  Diabetes in the sister.  Negative for stroke.   REVIEW OF SYSTEMS:  Positive for discomfort in his legs, as noted, as  well as home oxygen on night CPAP, asthma, wheezing, arthritis, joint  pain, dyspnea on exertion, orthopnea, reflux esophagitis, hiatal hernia,  diarrhea, and urinary frequency.  All other systems are negative in  review of systems.   PHYSICAL EXAMINATION:  Blood pressure 149/94, heart rate 98,  respirations 14.  General:  He is an obese, well-nourished male who is  in no apparent distress, alert and oriented x3.  HEENT:  Normal for age.  EOMs intact.  Lungs:  Clear to auscultation.  No rhonchi or  wheezing.  Cardiovascular:  Regular rhythm.  No murmurs.  Carotid pulses are 3+.  No audible bruits.  Abdomen:  Obese.  No pulsatile mass.  Musculoskeletal:  Free of major deformities.  Neurologic:  Normal.  Skin:  Free of rashes.  Lower extremity exam reveals 3+ femoral pulses  bilaterally with no popliteal or distal pulses.  No edema is noted.  No  infection or ulceration.   Today I ordered a lower extremity arterial Doppler study, which I have  reviewed and interpreted.  ABIs are 0.63 on the right and 0.59 on the  left, similar to his most recent study in our vascular lab in February  of this year.   His symptoms in the left medial thigh are not due to vascular occlusive  disease but sound more mechanical in nature, possibly due to disk  disease or orthopedic problems.  His vascular disease is stable and is  not his limiting factor with walking, considering his COPD and his other  problems.  We will continue to follow him on a regular basis for  progression of his vascular disease.  His left iliac stent seems to be  functioning well.     Quita Skye Hart [place], M.Simon.  Electronically Signed   JDL/MEDQ  Simon:  08/14/2010  T:  08/15/2010  Job:  [id number]   cc:   Lupita Raider, M.Simon.

## 2011-02-26 NOTE — Discharge Summary (Signed)
[name], [name]                ACCOUNT NO.:  [id number]   MEDICAL RECORD NO.:  [id number]          PATIENT TYPE:  INP   LOCATION:  1434                         FACILITY:  [facility]   PHYSICIAN:  Ramiro Harvest, MD    DATE OF BIRTH:  [birth year]   DATE OF ADMISSION:  07/23/2007  DATE OF DISCHARGE:  07/30/2007                               DISCHARGE SUMMARY   DISCHARGE DIAGNOSES:  1. Right upper and right middle lobe community-acquired pneumonia.  2. Diarrhea - viral gastroenteritis.  3. Diabetes mellitus, type 2.  4. Hypothyroidism.  5. Hyperlipidemia.   DISCHARGE MEDICATIONS:  1. Avelox 400 mg p.o. daily x8 days.  2. Synthroid 200 mcg p.o. daily.  3. Aspirin 325 mg p.o. daily.  4. Glyburide 1 mg p.o. daily.  5. Nabumetone 750 mg b.i.d.  6. Vytorin 10/80 one tab p.o. daily.   DISPOSITION/FOLLOW UP:  The patient is to call to schedule an  appointment to follow up with his primary care physician, Dr. Arvilla Market,  in two weeks.  A CBC needs to be checked to follow the patient's white  count.  BMET also needs to be checked to follow the patient's  electrolytes as well and a chest x-ray will need to be done in the next  1-2 months to follow up on resolution of the patient's community-  acquired pneumonia.   PROCEDURES PERFORMED:  1. Chest x-ray was performed on July 22, 2007, which showed a right      middle lobe pneumonia, nodular peripheral densities in the right      mid lung and left lung base in the interval.  Short interval follow-      up chest x-ray recommended to ensure that these resolve.  2.      Calcified granuloma in the left mid lung as seen on a CT scan from      Feb 13, 2004.  2. Abdominal x-ray was done on July 22, 2007 which showed a      nonspecific bowel gas pattern.  3. Chest x-ray done on July 24, 2007 showed worsening consolidation      in the right middle lobe, little change in peripheral density in      the right chest.  4. Chest x-ray done on  July 27, 2007 showed mild increase in right      middle lobe infiltrate, increase in right pleural effusion.  5. Chest x-ray done on October 15 showed lateral change in anterior      right upper lobe and right middle lobe opacities consistent with      pneumonia with swollen nodular opacities on the left.  Recommend      continued follow-up.   CONSULTATIONS:  None.   BRIEF HISTORY AND PHYSICAL:  [name] is a 76 year old African-  American male with past medical history of type 2 diabetes,  hypothyroidism, who was diagnosed with pneumonia one day prior to  admission as an outpatient and started on p.o. antibiotics.  He came to  the ED complaining of shortness of breath.  The patient had previously  been well  but earlier on in the week started to notice some shortness of  breath, mild cough, which was progressive.  He came in to see his PCP's  partner, Dr. Lupe Carney, who started the patient on outpatient  ciprofloxacin.  Over the course of the next 24 hours the patient  complained of progressively worsening shortness of breath, became  concerned, and came to the ED for further evaluation.  In the emergency  room he was found to have a white count of 15.8, noted to be slightly  tachycardic with a heart rate of 127 with a temperature of 103.  It took  about 6 liters of oxygen for the patient to saturate 98% on room air.  With these findings it was felt best the patient come in for further  evaluation.  The patient was given a dose of Rocephin and Zithromax in  the ED and started on nasal oxygen.  The patient at the time of  admission felt a little bit better but still with complaints of  shortness of breath and generalized weakness.  The patient denied any  headaches, no visual changes, no dysphagia, no chest pain, or  palpitations.  No wheezing.  The patient was complaining of a mild  cough, no abdominal pain, no hematuria, no dysuria, no constipation or  diarrhea.  No  focal extremity weakness, no muscle pain.  Review of  systems was otherwise negative.   PHYSICAL EXAMINATION:  Temperature 103, pulse 127, blood pressure  127/71, respirations 32, saturating 98% on 6 L.  GENERAL:  The patient was alert and oriented x3 in some distress  secondary to shortness of breath.  HEENT:  Normocephalic, atraumatic.  Pupils equal, round, and reactive to  light.  Extraocular movements intact.  Mucous membranes slightly dry.  Narrow airway.  NECK:  Supple.  No lymphadenopathy.  LUNGS:  Decreased breath sounds bilaterally at the bases.  No wheezes  heard.  CARDIOVASCULAR:  Regular rate and rhythm.  Tachycardic.  ABDOMEN:  Soft, obese, nontender, nondistended.  No guarding, no  rebound.  Positive bowel sounds.  EXTREMITIES:  No clubbing, cyanosis, or edema.   LABORATORY DATA:  Admission labs:  Chest x-ray which was obtained on the  day of admission as stated above did show a right middle lobe pneumonia.   Lab results obtained on admission showed a CBC with white count of 15.8,  hemoglobin of 12, hematocrit of 35.4, platelets of 173, MCV of 87.5, RDW  of 14.3.  Basic metabolic profile showed a sodium of 134, potassium 3.8,  chloride 95, bicarb 29, BUN 12, creatinine 1.11, glucose of 169, calcium  of 9.1.  Hemoglobin A1c was 7.4.   HOSPITAL COURSE:  1. RIGHT UPPER LOBE AND RIGHT MIDDLE LOBE COMMUNITY-ACQUIRED      PNEUMONIA.  [name] was initially brought in with      worsening shortness of breath, was placed on IV antibiotics of      Rocephin and azithromycin, placed on oxygen and nebulizer      treatments.  Sputum cultures were obtained and were negative.  The      patient's blood cultures were also obtained and respiratory      cultures obtained as well.  Respiratory cultures grew out normal      oropharyngeal flora,  During the hospitalization the patient      started to spike fevers.  As of October 11, chest x-ray was      obtained which showed a  worsening pneumonia.  Respiratory cultures      were taken again and those were pending.  The patient's      azithromycin was changed to Avelox.  The patient was maintained on      Rocephin and Avelox.  The patient started to improve after this      change in his IV antibiotics.  The patient's shortness of breath      improved.  His cough also improved.  The patient improved daily and      on the day of discharge the patient was stable with stable and      improved condition.  The patient was saturating greater than 90% on      room air.  The patient will be discharged home on an 8-day course      of Avelox to complete a 2-week course of antibiotics.  On follow-up      the patient will need to be reassessed by his PCP and in the next 1-      2 months will need a repeat chest x-ray to follow up on resolution      of pneumonia.  The patient will be discharged in stable and      improved condition.  2. VIRAL GASTROENTERITIS.  During the hospitalization on October 11,      the patient developed diarrhea.  The patient's stools were cultured      for C. difficile.  C. difficile came back negative x3.  Stool for      ova and parasites was also obtained which were also negative as      well.  The patient's diarrhea improved.  On the day of discharge      the patient's diarrhea had resolved and it was felt that the      patient's diarrhea was likely secondary to viral gastroenteritis.      On day of discharge the patient's condition was stable and      improved.  3. TYPE 2 DIABETES.  The patient was maintained on his home dose of      glyburide and placed on sliding scale insulin.  The patient's CBGs      were monitored daily.  4. HYPOTHYROIDISM.  Stable throughout the hospitalization.  The      patient was maintained on his home dose of Levothyroxine.  5. HYPERLIPIDEMIA.  Stable.  The patient was maintained on his home      dose of Vytorin.   On the day of discharge vital signs were:   Temperature 98.2, pulse of  98, respirations 20, blood pressure 162/87, saturating 92% on room air.  CBGs ranged from 119 through 130.   Labs on day of discharge:  CBC:  White count of 9.6, hemoglobin of 12.1,  platelet count of 312, hematocrit 37.5, MCV 89.5, RDW of 13.  BMET:  Sodium 138, potassium 3.9, chloride 94, bicarb 36, BUN 12, creatinine  1.04, glucose of 152, and calcium of 9.1.  The patient will be  discharged in stable and improved condition to follow up with his  primary care physician.  It has been a pleasure taking care of [name].      Ramiro Harvest, MD  Electronically Signed     DT/MEDQ  D:  07/30/2007  T:  07/31/2007  Job:  [id number]   cc:   Donia Guiles, M.D.  Fax: [phone]

## 2011-02-26 NOTE — H&P (Signed)
[name], [name]:  [id number]   MEDICAL RECORD NO.:  [id number]          PATIENT TYPE:  EMS   LOCATION:  ED                           FACILITY:  [facility]   PHYSICIAN:  Eddie Harvest, MD    DATE OF BIRTH:  November 13, 1929   DATE OF ADMISSION:  04/06/2009  DATE OF DISCHARGE:                              HISTORY & PHYSICAL   CHIEF COMPLAINT:  Chest pain.   HISTORY OF PRESENT ILLNESS:  Mr. [name] is a 77 year old African  American male who presents with complaint of chest pain.  His past  medical history includes diabetes type 2, obesity, hyperlipidemia and  hypertension.  He was seen in the office on March 28, 2009 with complaint  of epigastric pain.  He was placed on a proton pump inhibitor at that  time.  The patient noted left-sided chest pain beneath his left breast  all day yesterday.  Initially felt like it was gas but he could not  burp.  He took some Gas-X without improvement.  He started to feel  better last night.  He went to bed and slept.  He woke on the morning of  admission today with chest pain 9-10/10, reports chest pain is currently  8/10.  This pain was not exacerbated by deep breath nor was it  reproducible to touch.  He did note that it was exacerbated with  movement going from a blending to a standing position.  He had mild  shortness of breath associated with this chest pain, but notes he has  chronic shortness of breath at baseline.  This pain is non-radiating and  nonreproducible. Patient denies fevers, no chills, no orthopnea, no  palpitations, no melena ,no hematochezia,no hemetemesis no dysuria, no  edema, No other associated symptoms.  We are asked to admit for further  evaluation and treatment.   ALLERGIES:  PLAVIX CAUSES ITCHING.   PAST MEDICAL HISTORY:  1. Diabetes type 2.  2. Asthma.  3. Hypothyroid.  4. Dyslipidemia.  5. DVT.  6. Obstructive sleep apnea.  7. Hypertension.  8. Anxiety.  9. GERD.  10.Osteoarthritis.  11.Infrarenal abdominal aortic aneurysm per CT June 2010, 3.2 x 3.4.  12.Tubular adenoma of the colon.   PAST SURGICAL HISTORY:  1. Endoscopy noted polyps and antral erosions.  2. Ventral hernia repair in 1992.  3. Peripheral vascular disease with left lower extremity stent      placement.  Patient unable to specify further.   SOCIAL HISTORY:  He is widowed x12 years.  He works as a Programmer, multimedia, has  his own church.  Denies tobacco currently.  He did have a tobacco  history but quit greater than 40 years ago.  Denies alcohol or illicit  drug use.  He has found children.   FAMILY HISTORY:  His mother was deceased at age 14, cause unknown.  Dad  deceased at 16, cause unknown.   MEDICATIONS:  1. Dicyclomine 10 mg p.o. q.i.d. p.r.n. pain.  2. Levothyroxine 200 mcg p.o. daily.  3. Vytorin 10/80 p.o. daily.  4. Lasix 20 mg p.o. daily.  5. Aspirin  81 mg p.o. daily.  6. Glimepiride 1 mg p.o. daily.  7. Xalatan 0.05% one drop to each eye nightly.   REVIEW OF SYSTEMS:  GENERAL:  No fever, no chills.  CARDIOVASCULAR:  Notes occasional hand swelling and lower extremity swelling.  Denies  palpitations.  Chronic orthopnea.  Sleeps sitting upright.  RESPIRATORY:  Mild shortness of breath secondary to asthma.  GI/ABDOMEN:  No nausea,  no vomiting.  He notes that he is prone to diarrhea.  PSYCHIATRIC:  No  depression, no anxiety.  MUSCULOSKELETAL:  Occasional hand and joint  pain.  NEURO:  No recent hearing or vision changes.  SKIN:  No rashes.  He does have a skin tag on his back that he is concerned about.  I  advised him to follow up with Dermatology.  LYMPH:  No lymphadenopathy.  GU:  Denies dysuria, denies hematuria.   LABORATORIES/RADIOLOGY:  Hemoglobin 15.1, hematocrit 47.5, white blood  cell count 6.9, platelets 89,000.  Sodium 138, potassium 4.2, chloride  100, bicarb 29.  BUN 15, creatinine 1.0, glucose 132.  D-dimer is 0.39.  Ck-MB 2.3, troponin less than 0.05.  Myoglobin 78.  Chest  x-ray:  No  active disease.  Stable granuloma left lung.  EKG:  Normal sinus rhythm.   PHYSICAL EXAMINATION:  BP:  125/85, heart rate 95, respiratory rate 20,  temperature 98.5, O2 is 91-93% on room air.  HEAD:  Normocephalic, atraumatic.  GENERAL:  Morbidly obese African American male in no acute distress.  ENT:  Moist oral mucosa.  NECK:  Thick.  No thyroid enlargement, no masses.  CARDIOVASCULAR:  S1, S2.  Regular rate and rhythm.  Trace of bilateral  lower extremity edema, trace bilateral hand swelling.  RESPIRATORY:  Breath sounds clear to auscultation bilaterally without  wheezes, rales or rhonchi.  No increased work of breathing.  ABDOMEN:  Firm, nontender.  Positive bowel sounds.  PSYCHIATRIC:  Alert and oriented x3, pleasant.  SKIN:  Positive skin tag  overlying the right scapula.  No rashes.  NEURO:  Moving all extremities.  Speech clear.   ASSESSMENT/PLAN:  1. Atypical chest pain.  This patient has multiple risk factors which      include age, obesity, hypertension, hyperlipidemia and diabetes      type 2.  We will place the patient on telemetry, cycle cardiac      enzymes, chest fasting lipid profile.  We will place him on deep      vein thrombosis prophylactic dose of Lovenox.  Oxygen via nasal      cannula.  Sublingual nitroglycerin as needed, morphine as needed      and daily aspirin 325 mg.  We will check an electrocardiogram in      the morning.  Continue his proton pump inhibitor.  We will check a      two-dimensional echocardiogram secondary to his complaints of      orthopnea and chronic swelling.  We will continue him on his home      Lasix and also check a B-natriuretic peptide.  2. Diabetes type 2.  Continue home medications at sliding scale      coverage while inpatient.  Check A1c.  3. Hyperlipidemia.  Continue Vytorin.  Check fasting lipid profile.  4. Hypertension.  Blood pressure stable.  Not currently on blood      pressure medications.  Monitor.  5.  Hypothyroid.  Continue Synthroid.  We will check TSH.  6. Obstructive sleep apnea.  No CPAP prior to  admission.  Outpatient      followup.  7. History of anxiety, currently stable.  8. Gastroesophageal reflux disease.  Antral erosions on      esophagogastroduodenoscopy January 2007.  Continue proton pump      inhibitor.  Hemoglobin 15.1.  9. Infrarenal abdominal aortic aneurysm 3.2 x 3.4 cm.  Plan outpatient      monitoring.  10.History of peripheral vascular disease, currently stable.  11.History of asthma, clinically stable.  Add as needed nebulizers.  12.Thrombocytopenia.  We will watch patient's platelets while on      Lovenox.  Etiology unclear at this point.  Also check LDH,      haptoglobin and peripheral smear.  13.Dysphagia.  Patient also notes that he has had dysphagia for      several months which is now getting worse.  He sees Dr. Laural Benes of      [facility] who has done his esophagogastroduodenoscopy      back in January of 2007.  Discussed need to follow up with Dr.      Laural Benes as an outpatient for this issue.      Sandford Craze, NP      Eddie Harvest, MD  Electronically Signed    [name]  D:  04/06/2009  T:  04/06/2009  Job:  [id number]   cc:   Donia Guiles, M.D.  Fax: [phone]   Danise Edge, M.D.  Fax: [phone]

## 2011-02-26 NOTE — H&P (Signed)
[name], [name]                ACCOUNT NO.:  [id number]   MEDICAL RECORD NO.:  [id number]          PATIENT TYPE:  EMS   LOCATION:  ED                           [facility]AC[facility]  [facility]   PHYSICIAN:  Michiel Cowboy, MDDATE OF BIRTH:  17-Aug-1930   DATE OF ADMISSION:  01/05/2008  DATE OF DISCHARGE:                              HISTORY & PHYSICAL   PRIMARY CARE PHYSICIAN:  [name], M.D.   CHIEF COMPLAINT:  Shortness of breath and wheezing.   This is a 77 year old gentleman with a past medical history significant  for diabetes and pneumonia in October of last year.  The patient reports  that since that time, he has been having worsening shortness of breath  and wheezing.  He was started on an albuterol inhaler with later  improvement and eventually was diagnosed with asthma although it is not  clear if he ever had true PFTs done.  The patient was feeling well until  a few days ago.  He had gotten a mild cold and then three days prior to  admission, started to have worsening shortness of breath and wheezing,  worse than his baseline.  The patient never wears oxygen, but when he  presented to the emergency department, he was noted to be slightly  hypoxic, satting at 90% on room air.  He was placed on 2 liters of  oxygen and was saturating between 92-95 liters.  At this point, the  patient was thought to be needing to come in for admission.  [facility] were called.   PAST MEDICAL HISTORY:  1. Diabetes mellitus.  2. Hypertension.  3. Hyperlipidemia.  4. Peripheral vascular disease.  5. Question asthma.  6. Sleep apnea.   REVIEW OF SYSTEMS:  Negative for chest pain.  Does endorse lower  extremity edema and orthopnea.  Endorses wheezes and occasional  coughing.  No fevers, no chills.  Endorses increase in weight.   SOCIAL HISTORY:  Past smoker and drinker 40 years.  Not currently.  Lives with wife.   FAMILY HISTORY:  Noncontributory.   ALLERGIES:  PLAVIX.   MEDICATIONS:  1. Full-dose aspirin.  2. Glimepiride 1 mg p.o. daily.  3. Levothyroxine 200 mcg p.o. daily.  4. Vytorin 10/80 mg p.o. daily.  5. Proventil as needed.   PHYSICAL EXAMINATION:  VITALS:  Temperature 98.9, blood pressure 127/70,  pulse 104, respirations 22.  Satting 98% on room air.  Patient appears to be in no acute distress.  Breathing without using  accessory muscles.  HEENT:  Atraumatic.  Moist mucous membranes.  LUNGS:  Wheezes bilaterally.  LOWER EXTREMITIES:  Trace edema.  HEART:  Rapid but regular.  No murmurs, rubs or gallops.  ABDOMEN:  Obese, nontender.  NEUROLOGIC:  Overall intact.   LABS:  White blood cell count 9 with 2% eosinophils, hemoglobin 14.6,  platelets 146.  Sodium 138, potassium 3.9, creatinine 1.   Chest x-ray showed no acute cardiopulmonary disease, no pneumonia.   No EKG was obtained.   ASSESSMENT/PLAN:  This is a 77 year old gentleman with a history of  questionable asthma after pneumonia with  history of diabetes,  hypertension, and peripheral vascular disease.  1. Shortness of breath and wheezing, possibly related to asthma      exacerbation:  Will give dose of Solu-Medrol 25 mg IV x1 and start      prednisone 60 mg daily tomorrow.  Watch sugars carefully.  Will      obtain D-dimer.  Given history of lower extremity swelling and      orthopnea, will obtain BNP and order 2D echocardiogram done.  Given      the fact that the patient may have asthma, will start on Advair and      give albuterol as needed for wheezing.  Also, given the fact the      patient has had a history of smoking in the past, will schedule      Atrovent q.8h.  2. Diabetes:  Sliding scale and glimepiride.  3. Hyperlipidemia:  Continue on Vytorin.  4. Hypothyroidism:  Continue levothyroxine.  Check TSH.  5. Tachycardia:  Will obtain EKG.  6. Peripheral vascular disease:  Will continue aspirin.  Wonder if the      patient's asthma is somehow relate to increase in his  dose of      aspirin.  Could be the aspirin triggered the asthma.  If no      improvement with baby aspirin, will need to readjust his aspirin      back to full dose the patient needs to be on, given his peripheral      vascular disease.  7. Prophylaxis:  Protonix and Lovenox.  8. Questionable history of sleep apnea, per patient.  Patient will      likely need to have a full sleep study evaluation in the future.      He had not been using his CPAP.      Michiel Cowboy, MD  Electronically Signed     AVD/MEDQ  D:  01/05/2008  T:  01/05/2008  Job:  [id number]   cc:   [name], M.D.  Fax: [phone]

## 2011-03-01 NOTE — Op Note (Signed)
NAMEAALIYAH, CANCRO                ACCOUNT NO.:  [id number]   MEDICAL RECORD NO.:  [id number]          PATIENT TYPE:  AMB   LOCATION:  ENDO                         FACILITY:  [facility]   PHYSICIAN:  Danise Edge, M.D.   DATE OF BIRTH:  04/11/1930   DATE OF PROCEDURE:  08/27/2004  DATE OF DISCHARGE:                                 OPERATIVE REPORT   PROCEDURE:  Esophagogastroduodenoscopy.   INDICATIONS:  Mr. Linh Johannes is a 77 year old male born 1930/03/16. Mr.  Champine is on a protein pump inhibitor for gastroesophageal reflux disease.  His primary complaint is intermittent choking with swallowing and halitosis.  He reports no dysphagia.   ENDOSCOPIST:  Danise Edge, M.D.   PREMEDICATION:  Versed 5 mg, Demerol 20 mg.   PROCEDURE NOTE:  After obtaining informed consent, Mr. Mcnab was placed in  the left lateral decubitus position. I administered intravenous Demerol and  intravenous Versed to achieve conscious sedation for the procedure. The  patient's blood pressure, oxygen saturation, and cardiac rhythm were  monitored throughout the procedure and documented in the medical record.   The Olympus gastric scope was passed through the posterior hypopharynx into  the proximal esophagus without difficulty. Hypopharynx and larynx appeared  normal. I did not visualize the vocal cords.   Esophagoscopy:  The proximal mid and lower segments of the esophageal mucosa  appeared completely normal. Squamocolumnar junction and esophagogastric  junction are noted at 45 cm from the incisor teeth.   Gastroscopy:  Retroflexed view of the gastric cardia and fundus was normal.  The gastric body appeared normal. In the proximal gastric antrum, a 5-mm  pedunculated polyp covered by what appears to be normal gastric mucosa was  biopsied multiple times with oozing but not spurting blood. The pylorus  appears normal.   Duodenoscopy:  The duodenal bulb, mid duodenum, and distal duodenum appeared  normal.   ASSESSMENT:  Normal esophagogastroduodenoscopy except for the presence of a  5-mm pedunculated polyp in the proximal gastric antrum which was biopsied  but not entirely removed.      MJ/MEDQ  D:  08/27/2004  T:  08/27/2004  Job:  [id number]

## 2011-03-01 NOTE — Op Note (Signed)
[name], [name]                ACCOUNT NO.:  [id number]   MEDICAL RECORD NO.:  [id number]          PATIENT TYPE:  AMB   LOCATION:  SDS                          FACILITY:  [facility]   PHYSICIAN:  Quita Skye. Hart Rochester, M.D.  DATE OF BIRTH:  08/18/1930   DATE OF PROCEDURE:  11/20/2006  DATE OF DISCHARGE:                               OPERATIVE REPORT   PREOPERATIVE DIAGNOSIS:  Bilateral lower extremity claudication  secondary to aortoiliac and femoral popliteal occlusive disease.   PROCEDURE:  1. Abdominal aortogram with bilateral lower extremity runoff via left      common femoral approach.  2. Selective catheterization right common iliac artery with right      lower extremity angiogram.  3. PTA and primary stenting of left common iliac artery stenosis using      a genesis on Opta 8 mm x 18 mm system with a single inflation at 10      atmospheres for 45 seconds with completion angiogram.   SURGEON:  Dr. Hart Rochester   ANESTHESIA:  Local Xylocaine, Versed 1 mg intravenously, Fentanyl 200  mcg, heparin 4000 units.   COMPLICATIONS:  None.   DESCRIPTION OF PROCEDURE:  The patient taken to [facility] and placed in supine position at which time both groins  prepped with Betadine solution and draped in routine sterile manner.  After infiltration of 1% Xylocaine, left common femoral artery was  entered percutaneously.  Guidewire passed up to the proximal common  iliac artery where it met resistance.  Retrograde angiogram was  performed which revealed 80% stenosis and tortuous iliac artery just  distal to the bifurcation.  Using a right coronary catheter.  The  guidewire traversed this lesion, passed in the suprarenal aorta and a  pigtail catheter was inserted after inserting a 5-French sheath.  Abdominal aortogram was performed injecting 20 mL contrast at 20 mL per  second.  This revealed the aorta to be widely patent, single renal  arteries bilaterally.  There was  some mild atherosclerotic changes in  the aorta.  At the bifurcation the right common internal, external iliac  arteries were patent but tortuous.  There was a focal 1 cm stenotic  lesion in the proximal left common iliac artery about 80% in severity  which was confirmed on RAO and LAO projections.  The left internal,  external iliac arteries were patent.  Bilateral lower extremity runoff  performed injecting 88 mL contrast at 8 mL per second.  On the right  side there was superficial femoral artery which was mildly diseased  throughout but patent.  There was total occlusion in the distal thigh  and the popliteal artery on the right was totally occluded with  reconstitution of the very distal tibioperoneal trunk and two-vessel  runoff through the posterior tibial and peroneal arteries.  This was  better confirmed by cannulating the right common iliac artery with an  IMA catheter and doing selective views below the knee.  On the left side  the superficial femoral artery, common femoral, profunda femoris  arteries were patent.  There was severe  disease in the distal  superficial femoral and popliteal artery with two linear 95% stenotic  lesions with the below-knee popliteal artery being widely patent and two-  vessel runoff through the peroneal anterior tibial arteries.  Following  this it was decided to proceed with PTA and stenting of left common  iliac artery and 4000 units heparin was given intravenously.  A 5 sheath  was exchanged for a long [place] sheath.  Using an 8 mm x 18 mm Genesis  on Opta system.  The angioplasty was  performed without difficulty with a single inflation at 10 atmospheres  for 45 seconds and completion angiogram revealed excellent cosmetic  result.  Having tolerated procedure well, sheath was removed. Following  the reversal the anticoagulation, no complications ensued.           ______________________________  Quita Skye Hart Rochester, M.D.     JDL/MEDQ  D:   11/20/2006  T:  11/20/2006  Job:  [id number]

## 2011-03-01 NOTE — Discharge Summary (Signed)
[name], [name]                ACCOUNT NO.:  [id number]   MEDICAL RECORD NO.:  [id number]          PATIENT TYPE:  INP   LOCATION:  4715                         FACILITY:  [facility]   PHYSICIAN:  Deirdre Peer. Polite, M.D. DATE OF BIRTH:  17-Apr-1930   DATE OF ADMISSION:  07/11/2005  DATE OF DISCHARGE:                                 DISCHARGE SUMMARY   DISCHARGE DIAGNOSES:  1.  Fever, resolved, with an abnormal outpatient urinalysis. However,      inpatient urinalysis within normal limits. Will complete empiric      treatment for urinary tract infection based on outpatient urinalysis.  2.  Right lower extremity edema. Please note ultrasound negative for deep      vein thrombosis. No current signs of cellulitis.  3.  Chronic obstructive pulmonary disease.  4.  Hypothyroidism.  5.  Diet-controlled diabetes.  6.  Gastroesophageal reflux disease.  7.  Benign prostatic hypertrophy by history.   DISCHARGE MEDICATIONS:  The patient is to resume outpatient medications  which include:  1.  Avodart.  2.  Lexapro.  3.  Levoxyl.  4.  Lasix.  5.  Aciphex.  6.  The patient will continue Cipro 500 b.i.d. for 3 days.   The patient asked to follow up with primary M.D., Dr. Donia Guiles, in one  week.   CONSULTS:  None.   STUDIES:  UA x2 negative. Blood cultures x2 negative. Chest x-ray:  Stable  dense nodule in the left lung consistent with granuloma, stable mild  scarring at the right lung base, no edema or infiltrate, heart size normal,  no pleural effusion. Venous Doppler negative for DVT. CBC on discharge:  White count 11.3, hemoglobin 14, platelets 150. BMET within normal limits.   HISTORY OF PRESENT ILLNESS:  A 77 year old male with above medical problems  sent to the hospital for evaluation because of fever and abnormal UA.  Concerns for probable urosepsis. Please see dictated H&P for further  details.   PAST MEDICAL HISTORY:  As stated above.   MEDICATIONS ON ADMISSION:  As  above.   SOCIAL HISTORY:  Negative for tobacco greater than 50 years. No alcohol or  drugs.   PAST SURGICAL HISTORY:  Per admission H&P.   HOSPITAL COURSE:  The patient was evaluated in the ED for fever,  leukocytosis and abnormal UA. It is thought the patient most likely has  early sepsis secondary to her urinary source. The patient was admitted  medicine telemetry bed, started with empiric IV fluids, IV Rocephin, and the  patient was pan cultured and other studies as stated above. The blood  cultures were negative. Chest x-ray was negative. Ironically, the patient's  UA in the hospital was within normal limits. However, reported outpatient UA  was abnormal. The patient did describe symptoms of hesitancy and other BPH  symptoms. The patient was empirically treated for a urinary source despite  above UA being within normal limits. Other possibilities were considered,  i.e. bronchitis, bacteremia; however, those studies were negative as well.  The patient did have some swelling his right lower extremity. He was  evaluated with ultrasound which was negative for DVT. There was no warmth,  no erythema, no broken skin to suggest early cellulitis. At this time, the  patient was afebrile with significant resolution and leukocytosis. The  patient is felt to be stable for discharge to home. The patient would like  to follow up with primary M.D. in one week. At that time, further evaluation  as deemed necessary by his primary M.D. The patient will continue other  outpatient medications as stated for other chronic medical problems.      Deirdre Peer. Polite, M.D.  Electronically Signed     RDP/MEDQ  D:  07/13/2005  T:  07/13/2005  Job:  [id number]   cc:   Donia Guiles, M.D.  Fax: [phone]

## 2011-03-01 NOTE — Op Note (Signed)
[name], [name]                          ACCOUNT NO.:  [id number]   MEDICAL RECORD NO.:  [id number]                   PATIENT TYPE:  AMB   LOCATION:  ENDO                                 FACILITY:  [facility]   PHYSICIAN:  Danise Edge, M.D.                DATE OF BIRTH:  May 13, 1930   DATE OF PROCEDURE:  11/02/2003  DATE OF DISCHARGE:                                 OPERATIVE REPORT   PROCEDURE:  Colonoscopy.   INDICATIONS FOR PROCEDURE:  Mr. Leotha Voeltz is a 77 year old male born 1930/03/29.  Mr. Goldston is scheduled to undergo a screening colonoscopy with  polypectomy to prevent colon cancer.   ENDOSCOPIST:  Danise Edge, M.D.   PREMEDICATION:  1. Versed 5 mg.  2. Demerol 50 mg.   PROCEDURE:  After obtaining informed consent, Mr. Baumgardner was placed in the  left lateral decubitus position.  I administered intravenous Demerol and  intravenous Versed to achieve conscious sedation for the procedure.  The  patient's blood pressure, oxygen saturation and cardiac rhythm were  monitored throughout the procedure, and then documented in the medical  record.   Anal inspection was normal.  The Olympus adjustable pediatric colonoscope  was introduced into the rectum and advanced to the proximal ascending colon.  I was unable to intubate the cecum due to colonic loop formation.  Colonic  preparation for the examination today was excellent.   Mr. Swiderski has universal colonic diverticulosis without diverticulitis or  diverticular stricture formation.   FINDINGS:  RECTUM:  Normal.  SIGMOID COLON:  Normal.  DESCENDING COLON:  Normal.  SPLENIC FLEXURE:  Normal.  TRANSVERSE COLON:  From the mid transverse colon a 5 mm sessile polyp was  removed with the electrocautery snare; submitted for pathologic  interpretation.  HEPATIC FLEXURE:  Normal.  ASCENDING COLON:  Normal.  CECUM:  Not examined.  ILEOCECAL VALVE:  Not examined.   ASSESSMENT:  1. Incomplete colonoscopy.  The cecum  and ileocecal valve were not examined     due to colonic loop formation.  2. Universal colonic diverticulosis.  3. A 5 mm polyp was removed from the mid transverse colon and submitted for     pathologic interpretation.   RECOMMENDATIONS:  1. I will schedule an air contrast barium enema, to be performed at [facility] in three-six weeks.  2. If polyp returns neoplastic pathologically, Mr. Zenon should undergo a     repeat colonoscopy in five years.                                               Danise Edge, M.D.    MJ/MEDQ  D:  11/02/2003  T:  11/02/2003  Job:  [id number]   cc:   Donia Guiles,  M.D.  [address] [place]  [place] [address]  Fax: [phone]

## 2011-03-01 NOTE — Procedures (Signed)
NAMEKING, PINZON                ACCOUNT NO.:  [id number]   MEDICAL RECORD NO.:  [id number]          PATIENT TYPE:  OUT   LOCATION:  SLEEP CENTER                 FACILITY:  [facility]   PHYSICIAN:  Clinton D. Maple Hudson, M.D. DATE OF BIRTH:  [birth year]   DATE OF STUDY:                              NOCTURNAL POLYSOMNOGRAM   REFERRING PHYSICIAN:  Donia Guiles, M.D.   INDICATION FOR STUDY:  Hypersomnia with sleep apnea.   EPWORTH SLEEPINESS SCORE:  5/24   BMI:  33   WEIGHT:  203 pounds   SLEEP ARCHITECTURE:  Total sleep time short, 261 minutes, sleep efficiency  64%.  Stage I 17%, stage II 57%, stages III and IV 16%.  REM 10% of total  sleep time.  Sleep latency 106 minutes.  REM latency 150 minutes.  Awake  after sleep onset 47 minutes.  Arousal index 19.  No bedtime medication  reported.   RESPIRATORY DATA:  NPSG protocol.  Respiratory disturbance index (RDI, AHI)  81.6 obstructive events per hour indicating severe obstructive sleep  apnea/hypopnea syndrome.  There were 321 obstructive apneas and 34  hypopneas.  Most events and most sleep were recorded while on the left side.  REM RDI 77.6.   OXYGEN DATA:  Moderate snoring with oxygen desaturations to a nadir of 75%.  Mean oxygen saturation through the study was 90% on room air.   CARDIAC DATA:  Normal sinus rhythm.   MOVEMENT/PARASOMNIA:  Occasional leg jerk.  Bathroom x2.   IMPRESSION/RECOMMENDATION:  1.  Severe obstructive sleep apnea/hypopnea syndrome, respiratory      disturbance index  81.6 per hour with      moderate snoring and oxygen desaturation to 75%.  2.  Consider return for CPAP titration or evaluate for alternative therapies      as appropriate.      Clinton D. Maple Hudson, M.D.  Diplomat    CDY/MEDQ  D:  05/05/2005 13:21:09  T:  05/06/2005 09:21:40  Job:  [id number]

## 2011-03-01 NOTE — H&P (Signed)
NAMEFINNICK, OROSZ                ACCOUNT NO.:  [id number]   MEDICAL RECORD NO.:  [id number]          PATIENT TYPE:  EMS   LOCATION:  MAJO                         FACILITY:  [facility]   PHYSICIAN:  Deirdre Peer. Polite, M.D. DATE OF BIRTH:  08/23/1930   DATE OF ADMISSION:  07/11/2005  DATE OF DISCHARGE:                                HISTORY & PHYSICAL   CHIEF COMPLAINT:  Weakness, chills.   HISTORY OF PRESENT ILLNESS:  Mr. [name] is a 77 year old male with a history  of multiple medical problems who was directly sent to ED for evaluation for  probable urosepsis. According to the patient he was in his usual state of  health until yesterday when he returned from fishing the patient felt weak  and had some chills and felt cold. The patient dealt with these symptoms all  night long, however called primary MD in the morning and the patient was  seen by his primary MD. The patient was febrile, had an abnormal UA,  borderline hypertensive, and somewhat tachycardic. Admission was deemed  necessary for further evaluation and treatment. The patient was sent  directly to the ED. While in the ED the patient was alert and oriented times  three, still complaints of some chills. Denies any productive cough, denies  fever, just feels chilly. Denies any sick contacts. Denies any nausea,  vomiting, or diarrhea. Does admit to BPH symptoms over the last two years,  however denies any dysuria or hematuria. In the ED, the patient had vitals,  still febrile, temperature of 101, BP 134/76, pulse 114, respiratory rate  26, and saturation of 94%. Admission is deemed necessary for further  evaluation and treatment, rule out possible urosepsis.   PAST MEDICAL HISTORY:  Significant for COPD, diet controlled diabetes,  hypothyroidism, GERD, anxiety, probable BPH.   MEDICATIONS ON ADMISSION:  Avodart daily, Lexapro 10 mg daily, Levoxyl 50  mcg daily, Lasix 40 mg daily, and AcipHex.   SOCIAL HISTORY:  Negative for  tobacco greater than 50 years. No alcohol or  drugs.   PAST SURGICAL HISTORY:  Significant for ventral hernia repair approximately  35 years ago. Denies appendectomy or cholecystectomy.   ALLERGIES:  No known drug allergies.   FAMILY HISTORY:  Mother and father, he is unsure. Brother and sister, he  thinks a few may have had coronary artery disease.   REVIEW OF SYSTEMS:  Same as in HPI.   PHYSICAL EXAMINATION:  GENERAL: The patient is alert and oriented in mild  stress secondary to chills. No obvious rigors.  VITAL SIGNS: Temperature 101.1, BP 134/76, pulse 114, respiratory rate 26,  saturation 94%.  HEENT: Muddy sclerae. Moist oral mucosa. No nodes.  CHEST: Moderate air movement without rales, rhonchi, or expiratory wheeze.  CARDIOVASCULAR: Regular. No S3.  ABDOMEN: Morbidly obese. No hepatosplenomegaly, nontender.  EXTREMITIES: Trace to 1+ edema. 2+ pulse.  NEUROLOGIC: Essentially nonfocal.   DATA:  Reported outpatient UA abnormal. CBC reveals white blood cell count  of 18.5 with hemoglobin of 14, platelet count 146,000. Other tests are  pending. Other output tests were TSH of 4.  ASSESSMENT:  1.  Fever.  2.  Leukocytosis.  3.  Abnormal urinalysis. Rule out urosepsis.  4.  Chronic obstructive pulmonary disease.  5.  Hypothyroidism.  6.  Diabetes, diet controlled.  7.  Gastroesophageal reflux disease.  8.  Anxiety.  9.  Benign prostatic hypertrophy.   I recommend the patient be admitted to a telemetry floor bed. The patient  will be pancultured. The patient will be started on IV antibiotics. I will  obtain chest x-ray as well. The patient will be started on gentle IV fluids  or resume other outpatient medications. Will obtain a baseline EKG and make  further recommendations after review of the above studies.      Deirdre Peer. Polite, M.D.  Electronically Signed     RDP/MEDQ  D:  07/11/2005  T:  07/11/2005  Job:  [id number]   cc:   Donia Guiles, M.D.  Fax:  [phone]

## 2011-07-08 LAB — BASIC METABOLIC PANEL
BUN: 18
CO2: 30
CO2: 33 — ABNORMAL HIGH
Calcium: 9.5
Chloride: 101
Chloride: 103
Creatinine, Ser: 0.95
Creatinine, Ser: 1
GFR calc Af Amer: >60
GFR calc Af Amer: >60
GFR calc non Af Amer: >60
Glucose, Bld: 123 — ABNORMAL HIGH
Glucose, Bld: 155 — ABNORMAL HIGH
Potassium: 3.9
Sodium: 138

## 2011-07-08 LAB — COMPREHENSIVE METABOLIC PANEL
AST: 39 — ABNORMAL HIGH
BUN: 13
CO2: 28
Chloride: 98
Creatinine, Ser: 1.15
GFR calc non Af Amer: >60
Glucose, Bld: 295 — ABNORMAL HIGH
Total Bilirubin: 0.8

## 2011-07-08 LAB — HEMOGLOBIN A1C
Hgb A1c MFr Bld: 6.7 — ABNORMAL HIGH
Mean Plasma Glucose: 161

## 2011-07-08 LAB — DIFFERENTIAL
Eosinophils Relative: 2
Lymphocytes Relative: 28
Lymphs Abs: 2.5
Monocytes Absolute: 0.6

## 2011-07-08 LAB — CBC
HCT: 42.8
HCT: 44.8
Hemoglobin: 14.6
MCHC: 32.8
MCHC: 33.4
MCHC: 33.8
MCV: 87.8
MCV: 89.2
MCV: 89.3
Platelets: 118 — ABNORMAL LOW
Platelets: 146 — ABNORMAL LOW
RBC: 4.9
RDW: 13.7
RDW: 14.8
WBC: 9

## 2011-07-24 LAB — CBC
Hemoglobin: 12.1 — ABNORMAL LOW
MCHC: 32.2
MCV: 89.5
RBC: 4.15 — ABNORMAL LOW
RBC: 4.19 — ABNORMAL LOW
WBC: 8.6
WBC: 9.6

## 2011-07-24 LAB — BASIC METABOLIC PANEL
CO2: 36 — ABNORMAL HIGH
Chloride: 94 — ABNORMAL LOW
GFR calc Af Amer: >60
Sodium: 138

## 2011-07-24 LAB — CLOSTRIDIUM DIFFICILE EIA

## 2011-07-25 LAB — CBC
HCT: 34.4 — ABNORMAL LOW
HCT: 36.2 — ABNORMAL LOW
HCT: 37.2 — ABNORMAL LOW
HCT: 37.7 — ABNORMAL LOW
HCT: 38 — ABNORMAL LOW
Hemoglobin: 11.7 — ABNORMAL LOW
Hemoglobin: 12 — ABNORMAL LOW
Hemoglobin: 12.5 — ABNORMAL LOW
Hemoglobin: 12.7 — ABNORMAL LOW
Hemoglobin: 12.7 — ABNORMAL LOW
MCHC: 33.6
MCHC: 33.7
MCHC: 34
MCHC: 34
MCHC: 34.1
MCV: 87.6
MCV: 88.4
MCV: 89.1
MCV: 89.5
Platelets: 183
Platelets: 200
Platelets: 208
Platelets: 247
RBC: 3.93 — ABNORMAL LOW
RBC: 4.04 — ABNORMAL LOW
RBC: 4.09 — ABNORMAL LOW
RBC: 4.15 — ABNORMAL LOW
RBC: 4.23
RDW: 14
RDW: 14.2 — ABNORMAL HIGH
RDW: 14.4 — ABNORMAL HIGH
RDW: 14.7 — ABNORMAL HIGH
WBC: 11.4 — ABNORMAL HIGH
WBC: 13.5 — ABNORMAL HIGH
WBC: 13.5 — ABNORMAL HIGH
WBC: 15.8 — ABNORMAL HIGH

## 2011-07-25 LAB — CULTURE, BLOOD (ROUTINE X 2)
Culture: NO GROWTH
Culture: NO GROWTH

## 2011-07-25 LAB — DIFFERENTIAL
Basophils Absolute: 0
Basophils Relative: 0
Eosinophils Absolute: 0
Eosinophils Relative: 0
Lymphocytes Relative: 13
Lymphs Abs: 1.8
Monocytes Absolute: 0.9 — ABNORMAL HIGH
Monocytes Relative: 7
Neutro Abs: 10.7 — ABNORMAL HIGH
Neutrophils Relative %: 80 — ABNORMAL HIGH

## 2011-07-25 LAB — BASIC METABOLIC PANEL WITH GFR
BUN: 10
CO2: 38 — ABNORMAL HIGH
Calcium: 9.3
Chloride: 95 — ABNORMAL LOW
Creatinine, Ser: 1.07
GFR calc non Af Amer: >60
Glucose, Bld: 100 — ABNORMAL HIGH
Potassium: 4.2
Sodium: 140

## 2011-07-25 LAB — CULTURE, RESPIRATORY W GRAM STAIN
Culture: NORMAL
Culture: NORMAL

## 2011-07-25 LAB — BASIC METABOLIC PANEL
CO2: 29
Calcium: 9.1
Chloride: 95 — ABNORMAL LOW
GFR calc Af Amer: >60
Sodium: 134 — ABNORMAL LOW

## 2011-07-25 LAB — CLOSTRIDIUM DIFFICILE EIA
C difficile Toxins A+B, EIA: NEGATIVE
C difficile Toxins A+B, EIA: NEGATIVE
C difficile Toxins A+B, EIA: NEGATIVE

## 2011-07-25 LAB — HEMOGLOBIN A1C
Hgb A1c MFr Bld: 7.4 — ABNORMAL HIGH
Mean Plasma Glucose: 186

## 2011-07-25 LAB — MAGNESIUM: Magnesium: 2.2

## 2011-07-25 LAB — EXPECTORATED SPUTUM ASSESSMENT W GRAM STAIN, RFLX TO RESP C

## 2011-07-25 LAB — STOOL CULTURE

## 2011-07-25 LAB — OVA AND PARASITE EXAMINATION

## 2011-08-01 ENCOUNTER — Ambulatory Visit
Admission: RE | Admit: 2011-08-01 | Discharge: 2011-08-01 | Disposition: A | Discharge status: Home / Self Care | Payer: Medicare Other | Source: Ambulatory Visit | Attending: Family Medicine | Admitting: Family Medicine

## 2011-08-01 ENCOUNTER — Other Ambulatory Visit: Payer: Self-pay | Admitting: Family Medicine

## 2011-08-01 DIAGNOSIS — R05 Cough: Secondary | ICD-10-CM

## 2011-08-01 DIAGNOSIS — R062 Wheezing: Secondary | ICD-10-CM

## 2011-08-15 ENCOUNTER — Other Ambulatory Visit: Payer: Self-pay

## 2011-08-24 ENCOUNTER — Other Ambulatory Visit: Payer: Self-pay

## 2011-08-24 ENCOUNTER — Emergency Department (HOSPITAL_COMMUNITY)
Admission: EM | Admit: 2011-08-24 | Discharge: 2011-08-24 | Disposition: A | Discharge status: Home / Self Care | Payer: Medicare Other | Attending: Emergency Medicine | Admitting: Emergency Medicine

## 2011-08-24 ENCOUNTER — Emergency Department (HOSPITAL_COMMUNITY): Payer: Medicare Other

## 2011-08-24 ENCOUNTER — Encounter (HOSPITAL_COMMUNITY): Payer: Self-pay | Admitting: *Deleted

## 2011-08-24 DIAGNOSIS — E7800 Pure hypercholesterolemia, unspecified: Secondary | ICD-10-CM | POA: Insufficient documentation

## 2011-08-24 DIAGNOSIS — I10 Essential (primary) hypertension: Secondary | ICD-10-CM | POA: Insufficient documentation

## 2011-08-24 DIAGNOSIS — E119 Type 2 diabetes mellitus without complications: Secondary | ICD-10-CM | POA: Insufficient documentation

## 2011-08-24 DIAGNOSIS — Z79899 Other long term (current) drug therapy: Secondary | ICD-10-CM | POA: Insufficient documentation

## 2011-08-24 DIAGNOSIS — J441 Chronic obstructive pulmonary disease with (acute) exacerbation: Secondary | ICD-10-CM | POA: Insufficient documentation

## 2011-08-24 DIAGNOSIS — R0602 Shortness of breath: Secondary | ICD-10-CM | POA: Insufficient documentation

## 2011-08-24 HISTORY — DX: Atherosclerotic heart disease of native coronary artery without angina pectoris: I25.10

## 2011-08-24 HISTORY — DX: Pure hypercholesterolemia, unspecified: E78.00

## 2011-08-24 HISTORY — DX: Essential (primary) hypertension: I10

## 2011-08-24 LAB — BASIC METABOLIC PANEL
BUN: 19 mg/dL (ref 6–23)
Calcium: 10 mg/dL (ref 8.4–10.5)
Creatinine, Ser: 0.99 mg/dL (ref 0.50–1.35)
GFR calc Af Amer: 88 mL/min — ABNORMAL LOW (ref >90)
GFR calc non Af Amer: 76 mL/min — ABNORMAL LOW (ref >90)
Glucose, Bld: 98 mg/dL (ref 70–99)
Potassium: 4.2 mEq/L (ref 3.5–5.1)

## 2011-08-24 LAB — DIFFERENTIAL
Basophils Absolute: 0 10*3/uL (ref 0.0–0.1)
Eosinophils Absolute: 0.2 10*3/uL (ref 0.0–0.7)
Eosinophils Relative: 2 % (ref 0–5)
Monocytes Absolute: 0.4 10*3/uL (ref 0.1–1.0)

## 2011-08-24 LAB — CBC
Hemoglobin: 13.5 g/dL (ref 13.0–17.0)
MCH: 29.1 pg (ref 26.0–34.0)
MCHC: 33.3 g/dL (ref 30.0–36.0)
Platelets: 160 10*3/uL (ref 150–400)
RDW: 13.7 % (ref 11.5–15.5)

## 2011-08-24 LAB — POCT I-STAT TROPONIN I: Troponin i, poc: 0 ng/mL (ref 0.00–0.08)

## 2011-08-24 MED ORDER — IPRATROPIUM BROMIDE 0.02 % IN SOLN
0.5000 mg | Freq: Once | RESPIRATORY_TRACT | Status: AC
  Administered 2011-08-24: 0.5 mg via RESPIRATORY_TRACT

## 2011-08-24 MED ORDER — ALBUTEROL SULFATE HFA 108 (90 BASE) MCG/ACT IN AERS: 1.0000 | INHALATION_SPRAY | Freq: Four times a day (QID) | RESPIRATORY_TRACT | Status: DC | PRN

## 2011-08-24 MED ORDER — ALBUTEROL SULFATE HFA 108 (90 BASE) MCG/ACT IN AERS
2.0000 | INHALATION_SPRAY | RESPIRATORY_TRACT | Status: DC | PRN
  Administered 2011-08-24: 2 via RESPIRATORY_TRACT
  Filled 2011-08-24: qty 6.7

## 2011-08-24 MED ORDER — PREDNISONE 20 MG PO TABS
60.0000 mg | ORAL_TABLET | Freq: Once | ORAL | Status: AC
  Administered 2011-08-24: 60 mg via ORAL
  Filled 2011-08-24: qty 3

## 2011-08-24 MED ORDER — ALBUTEROL SULFATE (5 MG/ML) 0.5% IN NEBU
2.5000 mg | INHALATION_SOLUTION | Freq: Once | RESPIRATORY_TRACT | Status: AC
  Administered 2011-08-24: 2.5 mg via RESPIRATORY_TRACT

## 2011-08-24 MED ORDER — PREDNISONE 20 MG PO TABS: ORAL_TABLET | ORAL | Status: AC

## 2011-08-24 NOTE — ED Notes (Signed)
Pt a/o x 4, resting quietly, skin warm and dry, respirations even and unlabored, no acute distress noted, no needs identified at this time, awaiting disposition. Remains with slight expiratory wheezing noted, able to speak in complete sentences. Family at bedside. Pt given po fluids and snacks, tolerating well

## 2011-08-24 NOTE — ED Notes (Signed)
Pt states "Right now Im breathing better than I have been all week, but I still wanted to come get checked out, its been rough the past few days, couldn't breathe well at all", also c/o nonproductive cough and runny nose intermittent x 2 weeks. Pt states he saw his PCP 9 days ago, "got a little better because they gave me a breathing treatment, then it got worse again after I saw them"

## 2011-08-24 NOTE — ED Provider Notes (Signed)
History     CSN: [id number] Arrival date & time: 08/24/2011  9:37 AM   First MD Initiated Contact with Patient 08/24/11 [phone]      Chief Complaint  Patient presents with  . Shortness of Breath    (Consider location/radiation/quality/duration/timing/severity/associated sxs/prior treatment) HPI Comments: Mr. history pleasant gentleman for the past 2 weeks of progressive shortness of breath he has been seen by his primary care doctor is currently being treated for COPD but is refusing to take his prednisone as it is too many tablets at a time he does use his albuterol inhaler 2 puffs every 4 hours as needed this morning he woke up feeling short of breath and did not use his inhaler but dressed slowly and came to the emergency department for evaluation  Patient is a 77 y.o. male presenting with shortness of breath. The history is provided by the patient and a relative.  Shortness of Breath  The current episode started more than 2 weeks ago. The problem occurs frequently. The problem has been gradually worsening. The problem is moderate. The symptoms are relieved by beta-agonist inhalers. The symptoms are aggravated by nothing. Associated symptoms include rhinorrhea, cough, shortness of breath and wheezing. Pertinent negatives include no chest pain.    Past Medical History  Diagnosis Date  . Diabetes mellitus   . Asthma   . Coronary artery disease   . Hypertension   . Hypercholesterolemia     Past Surgical History  Procedure Date  . Coronary stent placement   . Hernia repair     No family history on file.  History  Substance Use Topics  . Smoking status: Never Smoker   . Smokeless tobacco: Not on file  . Alcohol Use: No      Review of Systems  Constitutional: Positive for chills. Negative for diaphoresis, activity change, appetite change, fatigue and unexpected weight change.  HENT: Positive for rhinorrhea.   Eyes: Negative.   Respiratory: Positive for cough, shortness  of breath and wheezing.   Cardiovascular: Negative for chest pain and leg swelling.  Gastrointestinal: Negative.   Genitourinary: Negative.   Skin: Negative.   Neurological: Negative.   Psychiatric/Behavioral: Negative.     Allergies  Clopidogrel bisulfate  Home Medications   Current Outpatient Rx  Name Route Sig Dispense Refill  . ALBUTEROL SULFATE HFA 108 (90 BASE) MCG/ACT IN AERS Inhalation Inhale 2 puffs into the lungs every 4 (four) hours as needed. For wheezing.     . ASPIRIN EC 325 MG PO TBEC Oral Take 325 mg by mouth daily.      . BUDESONIDE-FORMOTEROL FUMARATE 80-4.5 MCG/ACT IN AERO Inhalation Inhale 2 puffs into the lungs 2 (two) times daily.      Marland Kitchen DIAZEPAM 5 MG PO TABS Oral Take 5 mg by mouth daily as needed. He takes only when having procedures done for anxiety.     Marland Kitchen EZETIMIBE-SIMVASTATIN 10-80 MG PO TABS Oral Take 1 tablet by mouth at bedtime.      . FUROSEMIDE 40 MG PO TABS Oral Take 40 mg by mouth daily as needed. For swelling or fluid retention.     Marland Kitchen GLIMEPIRIDE 2 MG PO TABS Oral Take 2 mg by mouth daily.      . ISOSORBIDE MONONITRATE ER 60 MG PO TB24 Oral Take 60 mg by mouth daily.      Marland Kitchen LATANOPROST 0.005 % OP SOLN Both Eyes Place 1 drop into both eyes at bedtime as needed. For glaucoma.     Marland Kitchen  LEVOTHYROXINE SODIUM 175 MCG PO TABS Oral Take 175 mcg by mouth daily.      Marland Kitchen LOPERAMIDE HCL 2 MG PO TABS Oral Take 2 mg by mouth 4 (four) times daily as needed. For diarrhea.     . METFORMIN HCL 500 MG PO TABS Oral Take 500 mg by mouth 2 (two) times daily.      Marland Kitchen METOPROLOL TARTRATE 25 MG PO TABS Oral Take 25 mg by mouth 2 (two) times daily.      Marland Kitchen NITROGLYCERIN 0.4 MG SL SUBL Sublingual Place 0.4 mg under the tongue every 5 (five) minutes as needed. For chest pain.     Marland Kitchen PRASUGREL HCL 10 MG PO TABS Oral Take 10 mg by mouth daily.      . ALBUTEROL SULFATE HFA 108 (90 BASE) MCG/ACT IN AERS Inhalation Inhale 1-2 puffs into the lungs every 6 (six) hours as needed for wheezing.  1 Inhaler 0  . PREDNISONE 20 MG PO TABS  3 Tabs PO Days 1-3, then 2 tabs PO Days 4-6, then 1 tab PO Day 7-9, then Half Tab PO Day 10-12 20 tablet 0    BP 121/98  Pulse 75  Temp(Src) 97.7 F (36.5 C) (Oral)  Resp 22  SpO2 91%  Physical Exam  Constitutional: He is oriented to person, place, and time. He appears well-developed and well-nourished.  HENT:  Head: Normocephalic.  Eyes: Pupils are equal, round, and reactive to light.  Neck: Normal range of motion.  Cardiovascular: Normal rate.   Pulmonary/Chest: Breath sounds normal. No accessory muscle usage. Not tachypneic. No respiratory distress. He has no decreased breath sounds. He has no rhonchi. He has no rales.       RN reports with position change noted slight wheezing in L base   Abdominal: Soft.  Musculoskeletal: Normal range of motion.  Neurological: He is alert and oriented to person, place, and time.  Skin: Skin is warm and dry.  Psychiatric: He has a normal mood and affect.    ED Course  Procedures (including critical care time)  Labs Reviewed  BASIC METABOLIC PANEL - Abnormal; Notable for the following:    GFR calc non Af Amer 76 (*)    GFR calc Af Amer 88 (*)    All other components within normal limits  CBC  DIFFERENTIAL  POCT I-STAT TROPONIN I  POCT CARDIAC MARKERS  I-STAT TROPONIN I   Dg Chest 2 View  08/24/2011  *RADIOLOGY REPORT*  Clinical Data: Shortness of breath  CHEST - 2 VIEW  Comparison: 08/01/2011  Findings: Normal heart size.  No pleural effusion or pulmonary edema.  The left midlung granuloma is unchanged from previous exam.  No airspace consolidation.  Mild spondylosis throughout the thoracic spine.  IMPRESSION:  1.  No acute cardiopulmonary abnormalities.  Original Report Authenticated By: Rosealee Albee, M.D.     1. COPD exacerbation       MDM  Patient with history of dose cardiac disease and COPD Will evaluate cardiac markers EKG chest x-ray provide any symptomatic treatment needed  while in the department  ED ECG REPORT   Date: 08/24/2011  EKG Time: 1:35 PM  Rate:75   Rhythm: normal sinus rhythm,  normal EKG, normal sinus rhythm, unchanged from previous tracings  Axis: normal  Intervals:none  ST&T Change: none  Narrative Interpretation:normal EKG                Arman Filter, NP 08/24/11 1336

## 2011-08-24 NOTE — ED Notes (Signed)
O2 sats running 92-93% with increased exp wheezing noted. Pt remains able to speak in complete sentences. RT notified

## 2011-08-24 NOTE — ED Provider Notes (Signed)
Medical screening examination/treatment/procedure(s) were conducted as a shared visit with non-physician practitioner(s) and myself.  I personally evaluated the patient during the encounter  Patient seen examined. No signs of respiratory distress noted. Patient does accuse his baseline will be given prescription for prednisone, instructed to use his oxygen at home and to take his albuterol inhaler as needed  Toy Baker, MD 08/24/11 1338

## 2011-08-24 NOTE — ED Notes (Signed)
Pt transported to and from xray via stretcher with tech 

## 2011-08-24 NOTE — ED Notes (Signed)
Orders changed from poct cardiac markers to i-stat troponin

## 2011-08-24 NOTE — ED Notes (Signed)
Pt has had increased shob increasing over the last 2 weeks, has history of Pneumonia, feels like it is developing again.

## 2011-08-26 ENCOUNTER — Ambulatory Visit (INDEPENDENT_AMBULATORY_CARE_PROVIDER_SITE_OTHER): Payer: Medicare Other | Admitting: *Deleted

## 2011-08-26 ENCOUNTER — Other Ambulatory Visit (INDEPENDENT_AMBULATORY_CARE_PROVIDER_SITE_OTHER): Payer: Medicare Other | Admitting: *Deleted

## 2011-08-26 DIAGNOSIS — I70219 Atherosclerosis of native arteries of extremities with intermittent claudication, unspecified extremity: Secondary | ICD-10-CM

## 2011-08-26 DIAGNOSIS — Z48812 Encounter for surgical aftercare following surgery on the circulatory system: Secondary | ICD-10-CM

## 2011-09-12 ENCOUNTER — Encounter: Payer: Self-pay | Admitting: Vascular Surgery

## 2011-09-12 NOTE — Procedures (Unsigned)
AORTA-ILIAC DUPLEX EVALUATION  INDICATION:  Left common iliac artery stent.  HISTORY: Diabetes:  Yes. Cardiac:  No. Hypertension:  Yes. Smoking:  Previous. Previous Surgery:  Left common iliac artery stent on 11/20/2006.              SINGLE LEVEL ARTERIAL EXAM                             RIGHT                  LEFT Brachial: Anterior tibial: Posterior tibial: Peroneal: Ankle/brachial index: Previous ABI/date:  AORTA-ILIAC DUPLEX EXAM Aorta - Proximal     36 cm/s Aorta - Mid          40 cm/s Aorta - Distal       34 cm/s  RIGHT                                   LEFT                   CIA-PROXIMAL          Not visualized                   CIA-DISTAL            Not visualized                   HYPOGASTRIC           Not visualized                   EIA-PROXIMAL          99 cm/s                   EIA-MID               85 cm/s                   EIA-DISTAL            149 cm/s  IMPRESSION: 1. Unable to adequately visualize the left common iliac artery due to     overlying bowel gas patterns and patient body habitus. 2. Monophasic Doppler waveforms noted throughout the left external     iliac artery with no significant increase in velocity. 3. Bilateral ankle brachial indices are noted on a separate report.  ___________________________________________ Quita Skye. Hart [place], M.D.  CH/MEDQ  D:  08/28/2011  T:  08/28/2011  Job:  [id number]

## 2011-09-16 ENCOUNTER — Other Ambulatory Visit: Payer: Self-pay

## 2011-09-16 DIAGNOSIS — I70219 Atherosclerosis of native arteries of extremities with intermittent claudication, unspecified extremity: Secondary | ICD-10-CM

## 2011-09-16 DIAGNOSIS — Z48812 Encounter for surgical aftercare following surgery on the circulatory system: Secondary | ICD-10-CM

## 2011-12-10 ENCOUNTER — Other Ambulatory Visit: Payer: Self-pay | Admitting: Gastroenterology

## 2011-12-10 DIAGNOSIS — R112 Nausea with vomiting, unspecified: Secondary | ICD-10-CM

## 2011-12-18 ENCOUNTER — Ambulatory Visit
Admission: RE | Admit: 2011-12-18 | Discharge: 2011-12-18 | Disposition: A | Discharge status: Home / Self Care | Payer: Medicare Other | Source: Ambulatory Visit | Attending: Gastroenterology | Admitting: Gastroenterology

## 2011-12-18 ENCOUNTER — Other Ambulatory Visit: Payer: Self-pay | Admitting: Gastroenterology

## 2011-12-18 DIAGNOSIS — R112 Nausea with vomiting, unspecified: Secondary | ICD-10-CM

## 2011-12-25 ENCOUNTER — Encounter (HOSPITAL_COMMUNITY): Payer: Self-pay | Admitting: Gastroenterology

## 2011-12-26 ENCOUNTER — Encounter (HOSPITAL_COMMUNITY): Payer: Self-pay | Admitting: *Deleted

## 2011-12-26 ENCOUNTER — Ambulatory Visit (HOSPITAL_COMMUNITY)
Admission: RE | Admit: 2011-12-26 | Discharge: 2011-12-26 | Disposition: A | Discharge status: Home / Self Care | Payer: Medicare Other | Source: Ambulatory Visit | Attending: Gastroenterology | Admitting: Gastroenterology

## 2011-12-26 ENCOUNTER — Encounter (HOSPITAL_COMMUNITY)
Admission: RE | Disposition: A | Discharge status: Home / Self Care | Payer: Self-pay | Source: Ambulatory Visit | Attending: Gastroenterology

## 2011-12-26 DIAGNOSIS — D131 Benign neoplasm of stomach: Secondary | ICD-10-CM | POA: Insufficient documentation

## 2011-12-26 DIAGNOSIS — Z79899 Other long term (current) drug therapy: Secondary | ICD-10-CM | POA: Insufficient documentation

## 2011-12-26 DIAGNOSIS — E039 Hypothyroidism, unspecified: Secondary | ICD-10-CM | POA: Insufficient documentation

## 2011-12-26 DIAGNOSIS — I10 Essential (primary) hypertension: Secondary | ICD-10-CM | POA: Insufficient documentation

## 2011-12-26 DIAGNOSIS — E7800 Pure hypercholesterolemia, unspecified: Secondary | ICD-10-CM | POA: Insufficient documentation

## 2011-12-26 DIAGNOSIS — Z7982 Long term (current) use of aspirin: Secondary | ICD-10-CM | POA: Insufficient documentation

## 2011-12-26 DIAGNOSIS — J449 Chronic obstructive pulmonary disease, unspecified: Secondary | ICD-10-CM | POA: Insufficient documentation

## 2011-12-26 DIAGNOSIS — I2510 Atherosclerotic heart disease of native coronary artery without angina pectoris: Secondary | ICD-10-CM | POA: Insufficient documentation

## 2011-12-26 DIAGNOSIS — K219 Gastro-esophageal reflux disease without esophagitis: Secondary | ICD-10-CM | POA: Insufficient documentation

## 2011-12-26 DIAGNOSIS — E119 Type 2 diabetes mellitus without complications: Secondary | ICD-10-CM | POA: Insufficient documentation

## 2011-12-26 DIAGNOSIS — J4489 Other specified chronic obstructive pulmonary disease: Secondary | ICD-10-CM | POA: Insufficient documentation

## 2011-12-26 DIAGNOSIS — K222 Esophageal obstruction: Secondary | ICD-10-CM | POA: Insufficient documentation

## 2011-12-26 DIAGNOSIS — G4733 Obstructive sleep apnea (adult) (pediatric): Secondary | ICD-10-CM | POA: Insufficient documentation

## 2011-12-26 HISTORY — PX: BALLOON DILATION: SHX5330

## 2011-12-26 HISTORY — DX: Anxiety disorder, unspecified: F41.9

## 2011-12-26 HISTORY — PX: ESOPHAGOGASTRODUODENOSCOPY: SHX5428

## 2011-12-26 HISTORY — DX: Peripheral vascular disease, unspecified: I73.9

## 2011-12-26 HISTORY — DX: Chronic obstructive pulmonary disease, unspecified: J44.9

## 2011-12-26 HISTORY — DX: Gastro-esophageal reflux disease without esophagitis: K21.9

## 2011-12-26 HISTORY — DX: Hypothyroidism, unspecified: E03.9

## 2011-12-26 HISTORY — DX: Sleep apnea, unspecified: G47.30

## 2011-12-26 SURGERY — EGD (ESOPHAGOGASTRODUODENOSCOPY)
Anesthesia: Moderate Sedation

## 2011-12-26 MED ORDER — FENTANYL CITRATE 0.05 MG/ML IJ SOLN
INTRAMUSCULAR | Status: AC
  Filled 2011-12-26: qty 4

## 2011-12-26 MED ORDER — MIDAZOLAM HCL 10 MG/2ML IJ SOLN
INTRAMUSCULAR | Status: DC | PRN
  Administered 2011-12-26: 2.5 mg via INTRAVENOUS
  Administered 2011-12-26: 2 mg via INTRAVENOUS
  Administered 2011-12-26: 2.5 mg via INTRAVENOUS

## 2011-12-26 MED ORDER — BUTAMBEN-TETRACAINE-BENZOCAINE 2-2-14 % EX AERO
INHALATION_SPRAY | CUTANEOUS | Status: DC | PRN
  Administered 2011-12-26: 1 via TOPICAL

## 2011-12-26 MED ORDER — DIPHENHYDRAMINE HCL 50 MG/ML IJ SOLN
INTRAMUSCULAR | Status: AC
  Filled 2011-12-26: qty 1

## 2011-12-26 MED ORDER — SODIUM CHLORIDE 0.9 % IV SOLN
Freq: Once | INTRAVENOUS | Status: AC
  Administered 2011-12-26: 500 mL via INTRAVENOUS

## 2011-12-26 MED ORDER — FENTANYL NICU IV SYRINGE 50 MCG/ML
INJECTION | INTRAMUSCULAR | Status: DC | PRN
  Administered 2011-12-26: 50 ug via INTRAVENOUS

## 2011-12-26 MED ORDER — MIDAZOLAM HCL 10 MG/2ML IJ SOLN
INTRAMUSCULAR | Status: AC
  Filled 2011-12-26: qty 4

## 2011-12-26 NOTE — Op Note (Signed)
Procedure: Diagnostic esophagogastroduodenoscopy with balloon dilation at the esophagogastric junction and gastric antral polypectomy.  Endoscopist: Danise Edge  Premedication: Fentanyl 50 mcg intravenously. Versed 7.5 mg intravenously.  Procedure: The patient was placed in the left lateral decubitus position. The Pentax gastroscope was passed through the posterior hypopharynx into the proximal esophagus without difficulty. The hypopharynx, larynx, and vocal cords appeared normal  Esophagoscopy: The proximal, mid, and lower segments of the esophageal mucosa appeared normal. There is no endoscopic evidence for the presence of erosive esophagitis, Barrett's esophagus, or esophageal stricture formation. The 18 mm esophageal balloon dilator was inflated at the esophagogastric junction without signs of mucosal disruption suggesting no evidence of a mucosal stricture causing narrowing at the esophagogastric junction visualized by barium esophagram. This narrowing may be due to the lower esophageal sphincter muscle.  Gastroscopy: Retroflex view of the gastric cardia and fundus was normal. The gastric body appeared normal. There is a 7 mm pedunculated polyp in the proximal gastric antrum. An Endo Clip was applied to the gastric polyp base. The polyp was partially removed with the electrocautery snare and cold biopsy forceps. Pathology was sent for evaluation. The pylorus appeared normal.  Duodenoscopy: The duodenal bulb and descending duodenum appeared normal.  Assessment: #1. A benign appearing gastric antral polyp was partially removed with the electrocautery snare and cold biopsy forceps. An Endo Clip was applied to the base of the polyp prior to partial gastric polypectomy. #2. The 18 mm esophageal balloon was inflated at the esophagogastric junction without disruption of the esophageal mucosa indicating no esophageal stricture formation. The patient may have a hypertensive lower esophageal sphincter  which does not completely relax. #3. Otherwise normal esophagogastroduodenoscopy.

## 2011-12-26 NOTE — H&P (Signed)
Problem: Nausea and vomiting. Distal esophageal stricture by barium esophagram with barium tablet swallow. Gastric polyp by barium upper GI x-ray series.  History: The patient is a 78 year old male born may 25th, 1936. The patient has type 2 diabetes mellitus and coronary artery disease. He takes aspirin and effient on a daily basis. He denies a past history of peptic ulcer disease.  For approximately 4 weeks, the patient has been experiencing postprandial nausea and vomiting. He reports no gastrointestinal bleeding, diarrhea, or constipation. He denies abdominal discomfort. It is not taking proton pump inhibitor.  The patient underwent a barium esophagram with barium tablet swallow which showed a stricture at the esophagogastric junction. He underwent a barium upper GI x-ray series which showed a polyp in the gastric antrum.  The patient is scheduled to undergo a diagnostic esophagogastroduodenoscopy, balloon dilation of the distal esophageal stricture, and removal of the gastric polyp.  Medications: Glimepiride. Imodium. Xalatan eyedrops. Oxycodone with acetaminophen. Diazepam. Nitroglycerin. Aspirin. Furosemide. Isosorbide mononitrate. Metoprolol. Albuterol. Levothyroxin. Vytorin. Symbicort .Effient.. Metformin.  Past medical history. Type 2 diabetes mellitus. Hypertension. Hypercholesterolemia. Coronary artery disease. Hypothyroidism. Chronic obstructive pulmonary disease. Generalized anxiety disorder. Obstructive sleep apnea. Gastroesophageal reflux. Ventral hernia repair. Coronary artery stents.  Allergies: Plavix causes itching.  Habits: The patient does not smoke cigarettes and does not consume alcohol.  Exam: The patient is alert and lying comfortably on the endoscopy stricture. Mouth and throat appear normal. Lungs are clear to auscultation. Cardiac exam reveals a regular rhythm without audible murmurs. Abdomen is soft, flat, and nontender to palpation in all quadrants.  Plan: Proceed  with diagnostic esophagogastroduodenoscopy as planned.

## 2011-12-26 NOTE — OR Nursing (Signed)
Gave pt paper discharge instructions due to med rec not being finished.

## 2011-12-26 NOTE — Discharge Instructions (Signed)
Esophagogastroduodenoscopy This is an endoscopic procedure (a procedure that uses a device like a flexible telescope) that allows your caregiver to view the upper stomach and small bowel. This test allows your caregiver to look at the esophagus. The esophagus carries food from your mouth to your stomach. They can also look at your duodenum. This is the first part of the small intestine that attaches to the stomach. This test is used to detect problems in the bowel such as ulcers and inflammation. PREPARATION FOR TEST Nothing to eat after midnight the day before the test. NORMAL FINDINGS Normal esophagus, stomach, and duodenum. Ranges for normal findings may vary among different laboratories and hospitals. You should always check with your doctor after having lab work or other tests done to discuss the meaning of your test results and whether your values are considered within normal limits. MEANING OF TEST  Your caregiver will go over the test results with you and discuss the importance and meaning of your results, as well as treatment options and the need for additional tests if necessary. OBTAINING THE TEST RESULTS It is your responsibility to obtain your test results. Ask the lab or department performing the test when and how you will get your results. Document Released: 01/31/2005 Document Revised: 09/19/2011 Document Reviewed: 09/09/2008 [employer] Patient Information 2012 [employer]. 

## 2011-12-27 ENCOUNTER — Encounter (HOSPITAL_COMMUNITY): Payer: Self-pay | Admitting: Gastroenterology

## 2011-12-27 NOTE — Progress Notes (Unsigned)
Patient ID: [name], male   DOB: 11/15/1929, 78 y.o.   MRN: [id number]

## 2012-03-06 ENCOUNTER — Encounter: Payer: Self-pay | Admitting: Neurosurgery

## 2012-03-10 ENCOUNTER — Ambulatory Visit: Payer: Self-pay | Admitting: Neurosurgery

## 2012-03-10 ENCOUNTER — Other Ambulatory Visit: Payer: Self-pay

## 2012-03-23 ENCOUNTER — Encounter (HOSPITAL_COMMUNITY): Admission: RE | Payer: Self-pay | Source: Ambulatory Visit

## 2012-03-23 ENCOUNTER — Ambulatory Visit (HOSPITAL_COMMUNITY): Admission: RE | Admit: 2012-03-23 | Payer: Medicare Other | Source: Ambulatory Visit | Admitting: Gastroenterology

## 2012-03-23 SURGERY — MANOMETRY, ESOPHAGUS

## 2012-03-31 ENCOUNTER — Encounter: Payer: Self-pay | Admitting: Neurosurgery

## 2012-04-01 ENCOUNTER — Ambulatory Visit (INDEPENDENT_AMBULATORY_CARE_PROVIDER_SITE_OTHER): Payer: Medicare Other | Admitting: Neurosurgery

## 2012-04-01 ENCOUNTER — Other Ambulatory Visit: Payer: Self-pay

## 2012-04-01 ENCOUNTER — Encounter (INDEPENDENT_AMBULATORY_CARE_PROVIDER_SITE_OTHER): Payer: Medicare Other | Admitting: *Deleted

## 2012-04-01 ENCOUNTER — Other Ambulatory Visit (INDEPENDENT_AMBULATORY_CARE_PROVIDER_SITE_OTHER): Payer: Medicare Other | Admitting: *Deleted

## 2012-04-01 ENCOUNTER — Encounter: Payer: Self-pay | Admitting: Neurosurgery

## 2012-04-01 VITALS — BP 132/76 | HR 77 | Resp 14 | Ht 64.0 in | Wt 195.3 lb

## 2012-04-01 DIAGNOSIS — I739 Peripheral vascular disease, unspecified: Secondary | ICD-10-CM | POA: Insufficient documentation

## 2012-04-01 DIAGNOSIS — I70219 Atherosclerosis of native arteries of extremities with intermittent claudication, unspecified extremity: Secondary | ICD-10-CM

## 2012-04-01 DIAGNOSIS — Z48812 Encounter for surgical aftercare following surgery on the circulatory system: Secondary | ICD-10-CM

## 2012-04-01 NOTE — Progress Notes (Signed)
[facility] Office Note  CC: ABIs and aortoiliac duplex Referring Physician: Lawson  History of Present Illness: 78-year-old male patient of Dr. Lawson status post a left CIA stent in November 2012, the patient reports some increased claudication symptoms with ambulation as well as mild rest pain. The patient reports no new medical diagnoses or recent surgeries. The patient has no open wounds or lower extremities.  Past Medical History  Diagnosis Date  . Diabetes mellitus   . Asthma   . Coronary artery disease   . Hypertension   . Hypercholesterolemia   . Hypothyroidism   . Anxiety   . COPD (chronic obstructive pulmonary disease)   . GERD (gastroesophageal reflux disease)   . CHF (congestive heart failure)     diastolic   . Sleep apnea     severe OSA awaiting CPAP titration  . Peripheral vascular disease 11/2006    s/p left stent    ROS: [x] Positive   [ ] Denies    General: [ ] Weight loss, [ ] Fever, [ ] chills Neurologic: [ ] Dizziness, [ ] Blackouts, [ ] Seizure [ ] Stroke, [ ] "Mini stroke", [ ] Slurred speech, [ ] Temporary blindness; [x ] weakness in arms or legs, [ ] Hoarseness Cardiac: [ ] Chest pain/pressure, [ ] Shortness of breath at rest [ ] Shortness of breath with exertion, [ ] Atrial fibrillation or irregular heartbeat Vascular: [x ] Pain in legs with walking, [x] Pain in legs at rest, [ ] Pain in legs at night,  [ ] Non-healing ulcer, [ ] Blood clot in vein/DVT,   Pulmonary: [ ] Home oxygen, [ ] Productive cough, [ ] Coughing up blood, [ ] Asthma,  [ ] Wheezing Musculoskeletal:  [ ] Arthritis, [ ] Low back pain, [ ] Joint pain Hematologic: [ ] Easy Bruising, [ ] Anemia; [ ] Hepatitis Gastrointestinal: [ ] Blood in stool, [ ] Gastroesophageal Reflux/heartburn, [ ] Trouble swallowing Urinary: [ ] chronic Kidney disease, [ ] on HD - [ ] MWF or [ ] TTHS, [ ] Burning with urination, [ ] Difficulty urinating Skin: [ ] Rashes, [  ] Wounds Psychological: [ ] Anxiety, [ ] Depression   Social History History  Substance Use Topics  . Smoking status: Former Smoker    Quit date: 10/14/1958  . Smokeless tobacco: Not on file  . Alcohol Use: No    Family History Family History  Problem Relation Age of Onset  . Malignant hyperthermia Neg Hx   . Diabetes Mother   . Hyperlipidemia Mother   . Hypertension Mother   . Heart attack Mother   . Cancer Sister   . Diabetes Sister   . Hyperlipidemia Sister   . Hypertension Sister   . Heart attack Sister   . Hyperlipidemia Sister   . Hypertension Sister     Allergies  Allergen Reactions  . Clopidogrel Bisulfate     REACTION: itch    Current Outpatient Prescriptions  Medication Sig Dispense Refill  . albuterol (PROVENTIL HFA;VENTOLIN HFA) 108 (90 BASE) MCG/ACT inhaler Inhale 2 puffs into the lungs every 4 (four) hours as needed. For wheezing.       . albuterol (PROVENTIL HFA;VENTOLIN HFA) 108 (90 BASE) MCG/ACT inhaler Inhale 1-2 puffs into the lungs every 6 (six) hours as needed for wheezing.  1 Inhaler  0  . aspirin EC 325 MG tablet Take 325 mg by mouth daily.        .   budesonide-formoterol (SYMBICORT) 80-4.5 MCG/ACT inhaler Inhale 2 puffs into the lungs 2 (two) times daily.        . diazepam (VALIUM) 5 MG tablet Take 5 mg by mouth daily as needed. He takes only when having procedures done for anxiety.       . ezetimibe-simvastatin (VYTORIN) 10-80 MG per tablet Take 1 tablet by mouth at bedtime.        . furosemide (LASIX) 40 MG tablet Take 40 mg by mouth daily as needed. For swelling or fluid retention.       . glimepiride (AMARYL) 2 MG tablet Take 2 mg by mouth daily.        . isosorbide mononitrate (IMDUR) 60 MG 24 hr tablet Take 60 mg by mouth daily.        . latanoprost (XALATAN) 0.005 % ophthalmic solution Place 1 drop into both eyes at bedtime as needed. For glaucoma.       . levothyroxine (SYNTHROID, LEVOTHROID) 175 MCG tablet Take 175 mcg by mouth daily.         . loperamide (IMODIUM A-D) 2 MG tablet Take 2 mg by mouth 4 (four) times daily as needed. For diarrhea.       . metFORMIN (GLUCOPHAGE) 500 MG tablet Take 500 mg by mouth 2 (two) times daily.        . metoprolol tartrate (LOPRESSOR) 25 MG tablet Take 25 mg by mouth 2 (two) times daily.        . nitroGLYCERIN (NITROSTAT) 0.4 MG SL tablet Place 0.4 mg under the tongue every 5 (five) minutes as needed. For chest pain.       . prasugrel (EFFIENT) 10 MG TABS Take 10 mg by mouth daily.          Physical Examination  Filed Vitals:   04/01/12 1032  BP: 132/76  Pulse: 77  Resp: 14    Body mass index is 33.52 kg/(m^2).  General:  WDWN in NAD Gait: Normal HEENT: WNL Eyes: Pupils equal Pulmonary: normal non-labored breathing , without Rales, rhonchi,  wheezing Cardiac: RRR, without  Murmurs, rubs or gallops; No carotid bruits Abdomen: soft, NT, no masses Skin: no rashes, ulcers noted Vascular Exam/Pulses: Lower extremity pulses are palpable left greater than right, femoral pulses are 2+ bilaterally, 2+ radial pulses bilaterally no bruits are heard in the carotids  Extremities without ischemic changes, no Gangrene , no cellulitis; no open wounds;  Musculoskeletal: no muscle wasting or atrophy  Neurologic: A&O X 3; Appropriate Affect ; SENSATION: normal; MOTOR FUNCTION:  moving all extremities equally. Speech is fluent/normal  Non-Invasive Vascular Imaging: ABIs today are 0.70 on the right, 0.63 on the left which is a decline from November 2012 when he was 0.71 on the right, 0.7 on left. Patient has turbulent flow noted in the left EIA, the left CIA is not visible.  ASSESSMENT/PLAN: I discussed the above with Dr. Dickson who recommends the patient undergo an aortogram with possible PTA of the left iliac stent, this will be scheduled for July 8 and his followup will be pending that procedure. The patient's questions were encouraged and answered, he is in agreement with this plan.  Mariyana Fulop ANP  Clinic M.D.: Dickson  

## 2012-04-07 NOTE — Procedures (Unsigned)
AORTA-ILIAC DUPLEX EVALUATION  INDICATION:  Follow up left CIA stent.  HISTORY: Diabetes:  Yes. Cardiac:  Yes. Hypertension:  Yes. Smoking:  Previous. Previous Surgery:  Left CIA stent 08/26/2011.              SINGLE LEVEL ARTERIAL EXAM                             RIGHT                  LEFT Brachial: Anterior tibial: Posterior tibial: Peroneal: Ankle/brachial index:      0.70                   0.63 Previous ABI/date:         08/26/2011; 0.71       08/26/2011; 0.70  AORTA-ILIAC DUPLEX EXAM Aorta - Proximal     Not visualized Aorta - Mid          Not visualized Aorta - Distal       Not visualized  RIGHT                                   LEFT                   CIA-PROXIMAL          NV                   CIA-DISTAL            NV                   HYPOGASTRIC           NV                   EIA-PROXIMAL          79                   EIA-MID               97 cm/s                   EIA-DISTAL            123 cm/s.  COMMON FEMORAL ARTERY LEFT:  149  IMPRESSION: 1. Very limited study due to overlying bowel gas and patient body     habitus. 2. Turbulent flow noted in the left external iliac artery suggestive     of a more proximal stenosis. 3. Could not visualize the left common iliac artery stent. 4. Right ankle brachial indices are suggestive of moderate arterial     disease and remain stable compared to the previous exam. 5. Left ankle brachial indices are suggestive of moderate arterial     disease and are slightly declined compared to the previous exam. 6. The left posterior tibial artery was not detected.  ___________________________________________ Quita Skye. Hart [place], M.D.  EM/MEDQ  D:  04/01/2012  T:  04/01/2012  Job:  [id number]

## 2012-04-14 ENCOUNTER — Encounter (HOSPITAL_COMMUNITY): Payer: Self-pay | Admitting: Pharmacy Technician

## 2012-04-19 MED ORDER — SODIUM CHLORIDE 0.9 % IV SOLN
INTRAVENOUS | Status: DC
  Administered 2012-04-20: 1000 mL via INTRAVENOUS

## 2012-04-20 ENCOUNTER — Ambulatory Visit (HOSPITAL_COMMUNITY)
Admission: RE | Admit: 2012-04-20 | Discharge: 2012-04-20 | Disposition: A | Discharge status: Home / Self Care | Payer: Medicare Other | Source: Ambulatory Visit | Attending: Vascular Surgery | Admitting: Vascular Surgery

## 2012-04-20 ENCOUNTER — Other Ambulatory Visit: Payer: Self-pay | Admitting: *Deleted

## 2012-04-20 ENCOUNTER — Telehealth: Payer: Self-pay | Admitting: Vascular Surgery

## 2012-04-20 ENCOUNTER — Encounter (HOSPITAL_COMMUNITY)
Admission: RE | Disposition: A | Discharge status: Home / Self Care | Payer: Self-pay | Source: Ambulatory Visit | Attending: Vascular Surgery

## 2012-04-20 DIAGNOSIS — I509 Heart failure, unspecified: Secondary | ICD-10-CM | POA: Insufficient documentation

## 2012-04-20 DIAGNOSIS — I70219 Atherosclerosis of native arteries of extremities with intermittent claudication, unspecified extremity: Secondary | ICD-10-CM

## 2012-04-20 DIAGNOSIS — I10 Essential (primary) hypertension: Secondary | ICD-10-CM | POA: Insufficient documentation

## 2012-04-20 DIAGNOSIS — Z01810 Encounter for preprocedural cardiovascular examination: Secondary | ICD-10-CM

## 2012-04-20 DIAGNOSIS — E7800 Pure hypercholesterolemia, unspecified: Secondary | ICD-10-CM | POA: Insufficient documentation

## 2012-04-20 DIAGNOSIS — G4733 Obstructive sleep apnea (adult) (pediatric): Secondary | ICD-10-CM | POA: Insufficient documentation

## 2012-04-20 DIAGNOSIS — I7092 Chronic total occlusion of artery of the extremities: Secondary | ICD-10-CM

## 2012-04-20 DIAGNOSIS — I2510 Atherosclerotic heart disease of native coronary artery without angina pectoris: Secondary | ICD-10-CM | POA: Insufficient documentation

## 2012-04-20 DIAGNOSIS — J4489 Other specified chronic obstructive pulmonary disease: Secondary | ICD-10-CM | POA: Insufficient documentation

## 2012-04-20 DIAGNOSIS — J449 Chronic obstructive pulmonary disease, unspecified: Secondary | ICD-10-CM | POA: Insufficient documentation

## 2012-04-20 DIAGNOSIS — I5030 Unspecified diastolic (congestive) heart failure: Secondary | ICD-10-CM | POA: Insufficient documentation

## 2012-04-20 DIAGNOSIS — E039 Hypothyroidism, unspecified: Secondary | ICD-10-CM | POA: Insufficient documentation

## 2012-04-20 DIAGNOSIS — E119 Type 2 diabetes mellitus without complications: Secondary | ICD-10-CM | POA: Insufficient documentation

## 2012-04-20 HISTORY — PX: ABDOMINAL AORTAGRAM: SHX5454

## 2012-04-20 LAB — POCT I-STAT, CHEM 8
Chloride: 103 mEq/L (ref 96–112)
HCT: 40 % (ref 39.0–52.0)
Potassium: 3.9 mEq/L (ref 3.5–5.1)
Sodium: 141 mEq/L (ref 135–145)

## 2012-04-20 LAB — POCT ACTIVATED CLOTTING TIME
Activated Clotting Time: 190 seconds
Activated Clotting Time: 179 seconds

## 2012-04-20 SURGERY — ABDOMINAL AORTAGRAM
Anesthesia: LOCAL

## 2012-04-20 MED ORDER — FENTANYL CITRATE 0.05 MG/ML IJ SOLN
INTRAMUSCULAR | Status: AC
  Filled 2012-04-20: qty 2

## 2012-04-20 MED ORDER — ONDANSETRON HCL 4 MG/2ML IJ SOLN: 4.0000 mg | Freq: Four times a day (QID) | INTRAMUSCULAR | Status: DC | PRN

## 2012-04-20 MED ORDER — MIDAZOLAM HCL 2 MG/2ML IJ SOLN
INTRAMUSCULAR | Status: AC
  Filled 2012-04-20: qty 2

## 2012-04-20 MED ORDER — LIDOCAINE HCL (PF) 1 % IJ SOLN
INTRAMUSCULAR | Status: AC
  Filled 2012-04-20: qty 30

## 2012-04-20 MED ORDER — SODIUM CHLORIDE 0.9 % IV SOLN: 1.0000 mL/kg/h | INTRAVENOUS | Status: DC

## 2012-04-20 MED ORDER — HEPARIN (PORCINE) IN NACL 2-0.9 UNIT/ML-% IJ SOLN
INTRAMUSCULAR | Status: AC
  Filled 2012-04-20: qty 1000

## 2012-04-20 MED ORDER — ACETAMINOPHEN 325 MG PO TABS: 650.0000 mg | ORAL_TABLET | ORAL | Status: DC | PRN

## 2012-04-20 MED ORDER — HEPARIN SODIUM (PORCINE) 1000 UNIT/ML IJ SOLN
INTRAMUSCULAR | Status: AC
  Filled 2012-04-20: qty 1

## 2012-04-20 NOTE — Telephone Encounter (Signed)
Called home # to notify of appointment and was instructed to call [name]'s cell [phone]. LVM @ [phone] and sent letter to notify of appt, dpm

## 2012-04-20 NOTE — Telephone Encounter (Signed)
Message copied by Fredrich Birks on Mon Apr 20, 2012  9:59 AM ------      Message from: Phillips Odor      Created: Mon Apr 20, 2012  9:25 AM      Regarding: FW: charges and follow up                   ----- Message -----         From: Chuck Hint, MD         Sent: 04/20/2012   9:06 AM           To: Reuel Derby, Erenest Blank, RN      Subject: charges and follow up                                    PROCEDURE:       1. Ultrasound-guided access to the left common femoral artery      2. Aortogram with bilateral iliac arteriogram and bilateral lower extremity runoff      3. Placement of Protege  9 mm x 4 cm self-expanding stent.      4. Post dilatation with a 7 mm x 3 cm balloon            SURGEON: Di Kindle. Edilia Bo, MD, FACS            He needs a follow up appointment in 3 weeks with ABIs and also an ultrasound to rule out an abdominal aortic aneurysm. That appointment should be with me thank you      CSD

## 2012-04-20 NOTE — Interval H&P Note (Signed)
History and Physical Interval Note:  04/20/2012 7:25 AM  [name]  has presented today for surgery, with the diagnosis of PVD  The various methods of treatment have been discussed with the patient and family. After consideration of risks, benefits and other options for treatment, the patient has consented to: ABDOMINAL AORTAGRAM AND POSSIBLE LEFT ILIAC ARTERY STENT.  The patient's history has been reviewed, patient examined, no change in status, stable for surgery.  I have reviewed the patients' chart and labs.  Questions were answered to the patient's satisfaction.     DICKSON,CHRISTOPHER S

## 2012-04-20 NOTE — Op Note (Signed)
PATIENT: [name]   MRN: [id number] DOB: [birth year]    DATE OF PROCEDURE: 04/20/2012  INDICATIONS: [name] is a 78 y.o. male who presents with left lower extremity claudication. He has had a previous left common iliac artery stent with evidence of restenosis on exam. He is brought in for arteriography and possible reintervention.  PROCEDURE:  1. Ultrasound-guided access to the left common femoral artery 2. Aortogram with bilateral iliac arteriogram and bilateral lower extremity runoff 3. Placement of Protege  9 mm x 4 cm self-expanding stent. 4. Post dilatation with a 7 mm x 3 cm balloon  SURGEON: Di Kindle. Edilia Bo, MD, FACS  ANESTHESIA: Local with sedation   EBL: minimal  TECHNIQUE: The patient was brought to the peripheral vascular lab and received 1 mg of Versed and 50 mcg of fentanyl. Both groins were prepped and draped in the usual sterile fashion. After the skin was infiltrated with 1% lidocaine, and under ultrasound guidance, the left common femoral artery was cannulated and a guidewire introduced into the iliac artery. Using the pigtail catheter I was able to direct the wire into the infrarenal aorta. A 5 French sheath was then placed over the wire. The pigtail catheter was positioned at the L1 vertebral body and flush aortogram obtained. The catheter was in position above the aortic bifurcation and oblique iliac projection was obtained. There was an approximate 60% recurrent stenosis within the previously placed left common iliac artery stents. The 5 French sheath was exchanged for a long 6 [place] bright-tipped sheath. The patient then received 4000 units of IV heparin and then an additional 1000 units of IV heparin. The 9 mm x4 cm self-expanding stent was selected and positioned across the area of disease extending from the top of the previous stent to down into the external iliac artery below the disease. The stent was deployed without difficulty. I then used a 7 mm x 3 cm  balloon and dilated within the stent proximally and then begin distally to 8 atmospheres for 60 seconds. The pigtail catheter was then positioned above the aortic bifurcation and completion film showed no residual stenosis. Bilateral lower extremity run off films were then obtained. At the completion of the procedure, the sheath was removed once the ACT was less than 175. No immediate complications were noted.  FINDINGS:  1. There are single renal arteries bilaterally with no significant renal artery stenosis identified. 2. Infrarenal aorta is widely patent with some ectasia of the distal aorta and also the proximal right common iliac artery. 3. The recurrent left common iliac arteries stenosis was successfully ballooned and stented as described above with no residual stenosis. 4. There is an area of approximately 50% stenosis below the area of ectasia in the right common iliac artery. The hypogastric arteries bilaterally have moderate proximal disease. The external iliac arteries are patent bilaterally. 5. On the left side, the common femoral, superficial femoral, and deep femoral arteries are patent. There is an moderate calcific disease throughout his superficial femoral artery on the left. There is an occlusion of the popliteal artery on the left above the knee with reconstitution of the below knee popliteal artery and three-vessel runoff via the anterior tibial, posterior tibial, and peroneal arteries on the left. 6. On the right side, the common femoral, superficial femoral, and deep femoral arteries are patent. There is moderate calcific disease in the right superficial femoral artery. The popliteal artery on the right is occluded at the level of the knee. There is  reconstitution of the tibial peroneal trunk with two-vessel runoff on the right via the peroneal and posterior tibial arteries. The anterior tibial artery is occluded.  Waverly Ferrari, MD, FACS [facility]  DATE OF DICTATION:   04/20/2012

## 2012-04-20 NOTE — H&P (View-Only) (Signed)
[facility] [place] [facility] Office Note  CC: ABIs and aortoiliac duplex Referring Physician: Hart Rochester  History of Present Illness: 78 year old male patient of Dr. Hart Rochester status post a left CIA stent in November 2012, the patient reports some increased claudication symptoms with ambulation as well as mild rest pain. The patient reports no new medical diagnoses or recent surgeries. The patient has no open wounds or lower extremities.  Past Medical History  Diagnosis Date  . Diabetes mellitus   . Asthma   . Coronary artery disease   . Hypertension   . Hypercholesterolemia   . Hypothyroidism   . Anxiety   . COPD (chronic obstructive pulmonary disease)   . GERD (gastroesophageal reflux disease)   . CHF (congestive heart failure)     diastolic   . Sleep apnea     severe OSA awaiting CPAP titration  . Peripheral vascular disease 11/2006    s/p left stent    ROS: [x]  Positive   [ ]  Denies    General: [ ]  Weight loss, [ ]  Fever, [ ]  chills Neurologic: [ ]  Dizziness, [ ]  Blackouts, [ ]  Seizure [ ]  Stroke, [ ]  "Mini stroke", [ ]  Slurred speech, [ ]  Temporary blindness; [x ] weakness in arms or legs, [ ]  Hoarseness Cardiac: [ ]  Chest pain/pressure, [ ]  Shortness of breath at rest [ ]  Shortness of breath with exertion, [ ]  Atrial fibrillation or irregular heartbeat Vascular: [x ] Pain in legs with walking, [x]  Pain in legs at rest, [ ]  Pain in legs at night,  [ ]  Non-healing ulcer, [ ]  Blood clot in vein/DVT,   Pulmonary: [ ]  Home oxygen, [ ]  Productive cough, [ ]  Coughing up blood, [ ]  Asthma,  [ ]  Wheezing Musculoskeletal:  [ ]  Arthritis, [ ]  Low back pain, [ ]  Joint pain Hematologic: [ ]  Easy Bruising, [ ]  Anemia; [ ]  Hepatitis Gastrointestinal: [ ]  Blood in stool, [ ]  Gastroesophageal Reflux/heartburn, [ ]  Trouble swallowing Urinary: [ ]  chronic Kidney disease, [ ]  on HD - [ ]  MWF or [ ]  TTHS, [ ]  Burning with urination, [ ]  Difficulty urinating Skin: [ ]  Rashes, [  ] Wounds Psychological: [ ]  Anxiety, [ ]  Depression   Social History History  Substance Use Topics  . Smoking status: Former Smoker    Quit date: 10/14/1958  . Smokeless tobacco: Not on file  . Alcohol Use: No    Family History Family History  Problem Relation Age of Onset  . Malignant hyperthermia Neg Hx   . Diabetes Mother   . Hyperlipidemia Mother   . Hypertension Mother   . Heart attack Mother   . Cancer Sister   . Diabetes Sister   . Hyperlipidemia Sister   . Hypertension Sister   . Heart attack Sister   . Hyperlipidemia Sister   . Hypertension Sister     Allergies  Allergen Reactions  . Clopidogrel Bisulfate     REACTION: itch    Current Outpatient Prescriptions  Medication Sig Dispense Refill  . albuterol (PROVENTIL HFA;VENTOLIN HFA) 108 (90 BASE) MCG/ACT inhaler Inhale 2 puffs into the lungs every 4 (four) hours as needed. For wheezing.       Marland Kitchen albuterol (PROVENTIL HFA;VENTOLIN HFA) 108 (90 BASE) MCG/ACT inhaler Inhale 1-2 puffs into the lungs every 6 (six) hours as needed for wheezing.  1 Inhaler  0  . aspirin EC 325 MG tablet Take 325 mg by mouth daily.        Marland Kitchen  budesonide-formoterol (SYMBICORT) 80-4.5 MCG/ACT inhaler Inhale 2 puffs into the lungs 2 (two) times daily.        . diazepam (VALIUM) 5 MG tablet Take 5 mg by mouth daily as needed. He takes only when having procedures done for anxiety.       Marland Kitchen ezetimibe-simvastatin (VYTORIN) 10-80 MG per tablet Take 1 tablet by mouth at bedtime.        . furosemide (LASIX) 40 MG tablet Take 40 mg by mouth daily as needed. For swelling or fluid retention.       Marland Kitchen glimepiride (AMARYL) 2 MG tablet Take 2 mg by mouth daily.        . isosorbide mononitrate (IMDUR) 60 MG 24 hr tablet Take 60 mg by mouth daily.        Marland Kitchen latanoprost (XALATAN) 0.005 % ophthalmic solution Place 1 drop into both eyes at bedtime as needed. For glaucoma.       Marland Kitchen levothyroxine (SYNTHROID, LEVOTHROID) 175 MCG tablet Take 175 mcg by mouth daily.         Marland Kitchen loperamide (IMODIUM A-D) 2 MG tablet Take 2 mg by mouth 4 (four) times daily as needed. For diarrhea.       . metFORMIN (GLUCOPHAGE) 500 MG tablet Take 500 mg by mouth 2 (two) times daily.        . metoprolol tartrate (LOPRESSOR) 25 MG tablet Take 25 mg by mouth 2 (two) times daily.        . nitroGLYCERIN (NITROSTAT) 0.4 MG SL tablet Place 0.4 mg under the tongue every 5 (five) minutes as needed. For chest pain.       . prasugrel (EFFIENT) 10 MG TABS Take 10 mg by mouth daily.          Physical Examination  Filed Vitals:   04/01/12 1032  BP: 132/76  Pulse: 77  Resp: 14    Body mass index is 33.52 kg/(m^2).  General:  WDWN in NAD Gait: Normal HEENT: WNL Eyes: Pupils equal Pulmonary: normal non-labored breathing , without Rales, rhonchi,  wheezing Cardiac: RRR, without  Murmurs, rubs or gallops; No carotid bruits Abdomen: soft, NT, no masses Skin: no rashes, ulcers noted Vascular Exam/Pulses: Lower extremity pulses are palpable left greater than right, femoral pulses are 2+ bilaterally, 2+ radial pulses bilaterally no bruits are heard in the carotids  Extremities without ischemic changes, no Gangrene , no cellulitis; no open wounds;  Musculoskeletal: no muscle wasting or atrophy  Neurologic: A&O X 3; Appropriate Affect ; SENSATION: normal; MOTOR FUNCTION:  moving all extremities equally. Speech is fluent/normal  Non-Invasive Vascular Imaging: ABIs today are 0.70 on the right, 0.63 on the left which is a decline from November 2012 when he was 0.71 on the right, 0.7 on left. Patient has turbulent flow noted in the left EIA, the left CIA is not visible.  ASSESSMENT/PLAN: I discussed the above with Dr. Edilia Bo who recommends the patient undergo an aortogram with possible PTA of the left iliac stent, this will be scheduled for July 8 and his followup will be pending that procedure. The patient's questions were encouraged and answered, he is in agreement with this plan.  Lauree Chandler ANP  Clinic M.D.: Edilia Bo

## 2012-05-13 ENCOUNTER — Ambulatory Visit: Payer: Medicare Other | Admitting: Vascular Surgery

## 2012-05-14 ENCOUNTER — Encounter: Payer: Self-pay | Admitting: Vascular Surgery

## 2012-05-19 ENCOUNTER — Encounter: Payer: Self-pay | Admitting: Vascular Surgery

## 2012-05-20 ENCOUNTER — Ambulatory Visit (INDEPENDENT_AMBULATORY_CARE_PROVIDER_SITE_OTHER): Payer: Medicare Other | Admitting: Vascular Surgery

## 2012-05-20 ENCOUNTER — Encounter: Payer: Self-pay | Admitting: Vascular Surgery

## 2012-05-20 ENCOUNTER — Encounter (INDEPENDENT_AMBULATORY_CARE_PROVIDER_SITE_OTHER): Payer: Medicare Other | Admitting: *Deleted

## 2012-05-20 ENCOUNTER — Other Ambulatory Visit (INDEPENDENT_AMBULATORY_CARE_PROVIDER_SITE_OTHER): Payer: Medicare Other | Admitting: *Deleted

## 2012-05-20 VITALS — BP 131/79 | HR 93 | Resp 20 | Ht 64.0 in | Wt 197.0 lb

## 2012-05-20 DIAGNOSIS — I739 Peripheral vascular disease, unspecified: Secondary | ICD-10-CM

## 2012-05-20 DIAGNOSIS — I7092 Chronic total occlusion of artery of the extremities: Secondary | ICD-10-CM

## 2012-05-20 DIAGNOSIS — Z01810 Encounter for preprocedural cardiovascular examination: Secondary | ICD-10-CM

## 2012-05-20 DIAGNOSIS — Z48812 Encounter for surgical aftercare following surgery on the circulatory system: Secondary | ICD-10-CM

## 2012-05-20 NOTE — Progress Notes (Signed)
[facility]  Patient name: [name] MRN: [id number] DOB: July 25, 1930 Sex: male  REASON FOR VISIT: status post left common iliac artery angioplasty and stenting.  HPI: [name] is a 78 y.o. male who is been followed by Dr. Hart Rochester for some time. He had performed a left common iliac artery stent in the past and he developed recurrent stenosis which was picked up a follow up duplex scan. ABI on the left it dropped to 63%. He was taken to the peripheral vascular lab 1 04/20/2012 and underwent angioplasty and stenting of her recurrent left common iliac artery stenosis with an excellent result. On the left side below that he did have occlusion of the popliteal artery with reconstitution below the knee and three-vessel runoff on the left. The popliteal artery on the right was also occluded at the level of the knee with reconstitution of the tibial peroneal trunk.  He does describe some mild calf claudication bilaterally. He also hasn't had pain in the left which occurs with sitting and standing also however and not just with ambulation. He denies any significant back pain.   REVIEW OF SYSTEMS: Arly.Keller ] denotes positive finding; [  ] denotes negative finding  CARDIOVASCULAR:  [ ]  chest pain   [ ]  dyspnea on exertion    CONSTITUTIONAL:  [ ]  fever   [ ]  chills  PHYSICAL EXAM: Filed Vitals:   05/20/12 0945  BP: 131/79  Pulse: 93  Resp: 20  Height: 5\' 4"  (1.626 m)  Weight: 197 lb (89.359 kg)   Body mass index is 33.82 kg/(m^2). GENERAL: The patient is a well-nourished male, in no acute distress. The vital signs are documented above. CARDIOVASCULAR: There is a regular rate and rhythm  PULMONARY: There is good air exchange bilaterally without wheezing or rales. He has palpable femoral pulses bilaterally. I cannot palpate pedal pulses.  ABI today is 82% on the left and 84% on the right. He has biphasic Doppler signals in the anterior tibial position  bilaterally.  His infrarenal aorta was slightly ectatic on his arteriograms I did obtain a duplex scan to rule out an aneurysm. The maximum diameter of his distal aorta is 3.2 cm.  MEDICAL ISSUES:  PVD (peripheral vascular disease) He has now undergone successful PTA and stenting of his left common iliac artery stenosis with the appropriate improvement in his left ankle-brachial index. He does have infrainguinal arterial occlusive disease bilaterally. I've ordered follow up ABIs in 6 months and he'll be seen back in our office at that time. In addition it was noted that his distal aorta was somewhat ectatic. Duplex scan today showed the maximum diameter was 3.2 cm. Think he should have a follow up ultrasound of his aorta in one year and I have ordered that test. Encouraged him to continue and leg as much as possible. I've explained that not convinced that the pain he is having in his left hip which oftentimes occurs at rest is related to his peripheral vascular disease in may be neurogenic. I have written a prescription for diabetic shoes given his peripheral vascular disease and risk of diabetic ulcers.   [name] Simon [facility] [place] Beeper: [phone]

## 2012-05-20 NOTE — Assessment & Plan Note (Signed)
He has now undergone successful PTA and stenting of his left common iliac artery stenosis with the appropriate improvement in his left ankle-brachial index. He does have infrainguinal arterial occlusive disease bilaterally. I've ordered follow up ABIs in 6 months and he'll be seen back in our office at that time. In addition it was noted that his distal aorta was somewhat ectatic. Duplex scan today showed the maximum diameter was 3.2 cm. Think he should have a follow up ultrasound of his aorta in one year and I have ordered that test. Encouraged him to continue and leg as much as possible. I've explained that not convinced that the pain he is having in his left hip which oftentimes occurs at rest is related to his peripheral vascular disease in may be neurogenic. I have written a prescription for diabetic shoes given his peripheral vascular disease and risk of diabetic ulcers.

## 2012-05-20 NOTE — Addendum Note (Signed)
Addended by: Sharee Pimple on: 05/20/2012 03:28 PM   Modules accepted: Orders

## 2012-07-14 DIAGNOSIS — K5792 Diverticulitis of intestine, part unspecified, without perforation or abscess without bleeding: Secondary | ICD-10-CM

## 2012-07-14 HISTORY — DX: Diverticulitis of intestine, part unspecified, without perforation or abscess without bleeding: K57.92

## 2012-08-12 ENCOUNTER — Emergency Department (HOSPITAL_COMMUNITY): Payer: Medicare Other

## 2012-08-12 ENCOUNTER — Inpatient Hospital Stay (HOSPITAL_COMMUNITY)
Admission: EM | Admit: 2012-08-12 | Discharge: 2012-08-14 | DRG: 378 | Disposition: A | Discharge status: Home / Self Care | Payer: Medicare Other | Attending: Internal Medicine | Admitting: Internal Medicine

## 2012-08-12 ENCOUNTER — Encounter (HOSPITAL_COMMUNITY): Payer: Self-pay | Admitting: Emergency Medicine

## 2012-08-12 DIAGNOSIS — J4489 Other specified chronic obstructive pulmonary disease: Secondary | ICD-10-CM | POA: Diagnosis present

## 2012-08-12 DIAGNOSIS — K2980 Duodenitis without bleeding: Secondary | ICD-10-CM | POA: Diagnosis present

## 2012-08-12 DIAGNOSIS — E7800 Pure hypercholesterolemia, unspecified: Secondary | ICD-10-CM | POA: Diagnosis present

## 2012-08-12 DIAGNOSIS — R635 Abnormal weight gain: Secondary | ICD-10-CM

## 2012-08-12 DIAGNOSIS — Z79899 Other long term (current) drug therapy: Secondary | ICD-10-CM

## 2012-08-12 DIAGNOSIS — E785 Hyperlipidemia, unspecified: Secondary | ICD-10-CM

## 2012-08-12 DIAGNOSIS — J449 Chronic obstructive pulmonary disease, unspecified: Secondary | ICD-10-CM | POA: Diagnosis present

## 2012-08-12 DIAGNOSIS — I5030 Unspecified diastolic (congestive) heart failure: Secondary | ICD-10-CM | POA: Diagnosis present

## 2012-08-12 DIAGNOSIS — F411 Generalized anxiety disorder: Secondary | ICD-10-CM | POA: Diagnosis present

## 2012-08-12 DIAGNOSIS — Z9861 Coronary angioplasty status: Secondary | ICD-10-CM

## 2012-08-12 DIAGNOSIS — E119 Type 2 diabetes mellitus without complications: Secondary | ICD-10-CM | POA: Diagnosis present

## 2012-08-12 DIAGNOSIS — E039 Hypothyroidism, unspecified: Secondary | ICD-10-CM | POA: Diagnosis present

## 2012-08-12 DIAGNOSIS — I2510 Atherosclerotic heart disease of native coronary artery without angina pectoris: Secondary | ICD-10-CM | POA: Diagnosis present

## 2012-08-12 DIAGNOSIS — Z7902 Long term (current) use of antithrombotics/antiplatelets: Secondary | ICD-10-CM

## 2012-08-12 DIAGNOSIS — K5731 Diverticulosis of large intestine without perforation or abscess with bleeding: Principal | ICD-10-CM | POA: Diagnosis present

## 2012-08-12 DIAGNOSIS — Z888 Allergy status to other drugs, medicaments and biological substances status: Secondary | ICD-10-CM

## 2012-08-12 DIAGNOSIS — I739 Peripheral vascular disease, unspecified: Secondary | ICD-10-CM

## 2012-08-12 DIAGNOSIS — D62 Acute posthemorrhagic anemia: Secondary | ICD-10-CM | POA: Diagnosis present

## 2012-08-12 DIAGNOSIS — Z7982 Long term (current) use of aspirin: Secondary | ICD-10-CM

## 2012-08-12 DIAGNOSIS — K219 Gastro-esophageal reflux disease without esophagitis: Secondary | ICD-10-CM | POA: Diagnosis present

## 2012-08-12 DIAGNOSIS — R0989 Other specified symptoms and signs involving the circulatory and respiratory systems: Secondary | ICD-10-CM

## 2012-08-12 DIAGNOSIS — I70219 Atherosclerosis of native arteries of extremities with intermittent claudication, unspecified extremity: Secondary | ICD-10-CM

## 2012-08-12 DIAGNOSIS — I509 Heart failure, unspecified: Secondary | ICD-10-CM | POA: Diagnosis present

## 2012-08-12 DIAGNOSIS — I10 Essential (primary) hypertension: Secondary | ICD-10-CM

## 2012-08-12 DIAGNOSIS — K922 Gastrointestinal hemorrhage, unspecified: Secondary | ICD-10-CM

## 2012-08-12 DIAGNOSIS — J45909 Unspecified asthma, uncomplicated: Secondary | ICD-10-CM

## 2012-08-12 DIAGNOSIS — Z8672 Personal history of thrombophlebitis: Secondary | ICD-10-CM

## 2012-08-12 DIAGNOSIS — Z8249 Family history of ischemic heart disease and other diseases of the circulatory system: Secondary | ICD-10-CM

## 2012-08-12 DIAGNOSIS — Z87891 Personal history of nicotine dependence: Secondary | ICD-10-CM

## 2012-08-12 DIAGNOSIS — G4733 Obstructive sleep apnea (adult) (pediatric): Secondary | ICD-10-CM

## 2012-08-12 HISTORY — DX: Shortness of breath: R06.02

## 2012-08-12 LAB — CBC WITH DIFFERENTIAL/PLATELET
Basophils Absolute: 0 10*3/uL (ref 0.0–0.1)
HCT: 36.1 % — ABNORMAL LOW (ref 39.0–52.0)
Lymphocytes Relative: 35 % (ref 12–46)
Lymphs Abs: 2.2 10*3/uL (ref 0.7–4.0)
Monocytes Absolute: 0.3 10*3/uL (ref 0.1–1.0)
Neutro Abs: 3.6 10*3/uL (ref 1.7–7.7)
RBC: 4.17 MIL/uL — ABNORMAL LOW (ref 4.22–5.81)
RDW: 13.8 % (ref 11.5–15.5)
WBC: 6.2 10*3/uL (ref 4.0–10.5)

## 2012-08-12 LAB — COMPREHENSIVE METABOLIC PANEL
ALT: 14 U/L (ref 0–53)
AST: 19 U/L (ref 0–37)
Alkaline Phosphatase: 40 U/L (ref 39–117)
CO2: 30 mEq/L (ref 19–32)
Chloride: 101 mEq/L (ref 96–112)
GFR calc Af Amer: >90 mL/min (ref >90)
GFR calc non Af Amer: 80 mL/min — ABNORMAL LOW (ref >90)
Glucose, Bld: 109 mg/dL — ABNORMAL HIGH (ref 70–99)
Potassium: 3.9 mEq/L (ref 3.5–5.1)
Sodium: 139 mEq/L (ref 135–145)
Total Bilirubin: 0.3 mg/dL (ref 0.3–1.2)

## 2012-08-12 LAB — OCCULT BLOOD, POC DEVICE: Fecal Occult Bld: POSITIVE

## 2012-08-12 LAB — CBC
HCT: 36.6 % — ABNORMAL LOW (ref 39.0–52.0)
HCT: 33.4 % — ABNORMAL LOW (ref 39.0–52.0)
Hemoglobin: 11.4 g/dL — ABNORMAL LOW (ref 13.0–17.0)
MCH: 28.7 pg (ref 26.0–34.0)
MCH: 29.6 pg (ref 26.0–34.0)
MCHC: 34.1 g/dL (ref 30.0–36.0)
MCV: 86.7 fL (ref 78.0–100.0)
RBC: 4.22 MIL/uL (ref 4.22–5.81)
WBC: 7.9 10*3/uL (ref 4.0–10.5)

## 2012-08-12 LAB — PROTIME-INR: INR: 0.98 (ref 0.00–1.49)

## 2012-08-12 LAB — TYPE AND SCREEN

## 2012-08-12 MED ORDER — SODIUM CHLORIDE 0.9 % IJ SOLN
3.0000 mL | Freq: Two times a day (BID) | INTRAMUSCULAR | Status: DC
  Administered 2012-08-12 – 2012-08-13 (×2): 3 mL via INTRAVENOUS

## 2012-08-12 MED ORDER — ASPIRIN EC 325 MG PO TBEC: 325.0000 mg | DELAYED_RELEASE_TABLET | Freq: Every day | ORAL | Status: DC

## 2012-08-12 MED ORDER — PRASUGREL HCL 10 MG PO TABS: 10.0000 mg | ORAL_TABLET | Freq: Every day | ORAL | Status: DC

## 2012-08-12 MED ORDER — BUDESONIDE-FORMOTEROL FUMARATE 80-4.5 MCG/ACT IN AERO
2.0000 | INHALATION_SPRAY | Freq: Two times a day (BID) | RESPIRATORY_TRACT | Status: DC
  Administered 2012-08-12 – 2012-08-14 (×4): 2 via RESPIRATORY_TRACT
  Filled 2012-08-12: qty 6.9

## 2012-08-12 MED ORDER — ONDANSETRON HCL 4 MG/2ML IJ SOLN
4.0000 mg | Freq: Once | INTRAMUSCULAR | Status: AC
  Administered 2012-08-12: 4 mg via INTRAVENOUS
  Filled 2012-08-12: qty 2

## 2012-08-12 MED ORDER — PANTOPRAZOLE SODIUM 40 MG IV SOLR
40.0000 mg | Freq: Once | INTRAVENOUS | Status: AC
  Administered 2012-08-12: 40 mg via INTRAVENOUS
  Filled 2012-08-12: qty 40

## 2012-08-12 MED ORDER — ALBUTEROL SULFATE HFA 108 (90 BASE) MCG/ACT IN AERS
2.0000 | INHALATION_SPRAY | Freq: Four times a day (QID) | RESPIRATORY_TRACT | Status: DC | PRN
  Filled 2012-08-12: qty 6.7

## 2012-08-12 MED ORDER — PANTOPRAZOLE SODIUM 40 MG IV SOLR
40.0000 mg | Freq: Two times a day (BID) | INTRAVENOUS | Status: DC
  Administered 2012-08-12 – 2012-08-14 (×4): 40 mg via INTRAVENOUS
  Filled 2012-08-12 (×5): qty 40

## 2012-08-12 MED ORDER — NITROGLYCERIN 0.4 MG SL SUBL: 0.4000 mg | SUBLINGUAL_TABLET | SUBLINGUAL | Status: DC | PRN

## 2012-08-12 MED ORDER — ONDANSETRON HCL 4 MG PO TABS: 4.0000 mg | ORAL_TABLET | Freq: Four times a day (QID) | ORAL | Status: DC | PRN

## 2012-08-12 MED ORDER — SODIUM CHLORIDE 0.9 % IV SOLN: 250.0000 mL | INTRAVENOUS | Status: DC | PRN

## 2012-08-12 MED ORDER — SODIUM CHLORIDE 0.9 % IV SOLN
1000.0000 mL | INTRAVENOUS | Status: DC
  Administered 2012-08-12: 1000 mL via INTRAVENOUS

## 2012-08-12 MED ORDER — ISOSORBIDE MONONITRATE ER 60 MG PO TB24
60.0000 mg | ORAL_TABLET | Freq: Every day | ORAL | Status: DC
  Administered 2012-08-12 – 2012-08-13 (×2): 60 mg via ORAL
  Filled 2012-08-12 (×3): qty 1

## 2012-08-12 MED ORDER — FUROSEMIDE 40 MG PO TABS
40.0000 mg | ORAL_TABLET | Freq: Every day | ORAL | Status: DC | PRN
  Filled 2012-08-12: qty 1

## 2012-08-12 MED ORDER — ONDANSETRON HCL 4 MG/2ML IJ SOLN: 4.0000 mg | Freq: Four times a day (QID) | INTRAMUSCULAR | Status: DC | PRN

## 2012-08-12 MED ORDER — LOPERAMIDE HCL 2 MG PO CAPS: 2.0000 mg | ORAL_CAPSULE | Freq: Four times a day (QID) | ORAL | Status: DC | PRN

## 2012-08-12 MED ORDER — LEVOTHYROXINE SODIUM 175 MCG PO TABS
175.0000 ug | ORAL_TABLET | Freq: Every day | ORAL | Status: DC
  Administered 2012-08-13 – 2012-08-14 (×2): 175 ug via ORAL
  Filled 2012-08-12 (×3): qty 1

## 2012-08-12 MED ORDER — EZETIMIBE-SIMVASTATIN 10-80 MG PO TABS
1.0000 | ORAL_TABLET | Freq: Every day | ORAL | Status: DC
  Administered 2012-08-12 – 2012-08-13 (×2): 1 via ORAL
  Filled 2012-08-12 (×3): qty 1

## 2012-08-12 MED ORDER — METOPROLOL TARTRATE 25 MG PO TABS
25.0000 mg | ORAL_TABLET | Freq: Every day | ORAL | Status: DC
  Administered 2012-08-13 – 2012-08-14 (×2): 25 mg via ORAL
  Filled 2012-08-12 (×3): qty 1

## 2012-08-12 MED ORDER — DIAZEPAM 5 MG PO TABS: 5.0000 mg | ORAL_TABLET | Freq: Every day | ORAL | Status: DC | PRN

## 2012-08-12 MED ORDER — SODIUM CHLORIDE 0.9 % IJ SOLN: 3.0000 mL | INTRAMUSCULAR | Status: DC | PRN

## 2012-08-12 MED ORDER — METFORMIN HCL 500 MG PO TABS
500.0000 mg | ORAL_TABLET | Freq: Two times a day (BID) | ORAL | Status: DC
  Administered 2012-08-12 – 2012-08-14 (×4): 500 mg via ORAL
  Filled 2012-08-12 (×5): qty 1

## 2012-08-12 MED ORDER — SODIUM CHLORIDE 0.9 % IJ SOLN
3.0000 mL | Freq: Two times a day (BID) | INTRAMUSCULAR | Status: DC
  Administered 2012-08-12: 3 mL via INTRAVENOUS
  Administered 2012-08-13: 21:00:00 via INTRAVENOUS
  Administered 2012-08-14: 3 mL via INTRAVENOUS

## 2012-08-12 NOTE — Consult Note (Signed)
[facility]astro[facility]ology Consultation Note  Referring Provider: Dr. Sherlon Handing [facility]) Primary Care Physician:  Lupita Raider, MD Primary Gastroenterologist:  Dr. Danise Edge  Reason for Consultation:  Blood in stool  HPI: [name] is a 77 y.o. male who was admitted and whom we've been asked to see for evaluation of blood in stool.  History of coronary artery disease with stents, 2 placed couple years ago and 1 placed < one year ago, on chronic Effient and aspirin.  In static state of GI health until last night.  At that time, he had acute onset of blood in stool, first black stool followed by more red blood per rectum.  No abdominal pain.  No hematemesis.  Has never had GI bleeding before.  Had endoscopy in March 2013 by Dr. Laural Benes for evaluation of dysphagia (with gastric polyp and distal esophageal stricture seen on esophagram); endoscopy showed gastric polyp (removed, pathology showed polypoid tissue without any dysplastic features) and GE junction appeared normal but was empirically dilated.  Colonoscopy done in 2010 was normal, specifically with no mention of diverticulosis.  No further bleeding for the past 4 hours.   Past Medical History  Diagnosis Date  . Diabetes mellitus   . Asthma   . Coronary artery disease   . Hypertension   . Hypercholesterolemia   . Hypothyroidism   . Anxiety   . COPD (chronic obstructive pulmonary disease)   . GERD (gastroesophageal reflux disease)   . CHF (congestive heart failure)     diastolic   . Sleep apnea     severe OSA awaiting CPAP titration  . Peripheral vascular disease 11/2006    s/p left stent    Past Surgical History  Procedure Date  . Coronary stent placement 2010 and 2012  . Hernia repair 1992    evntral hernia repair  . Esophagogastroduodenoscopy 12/26/2011    Procedure: ESOPHAGOGASTRODUODENOSCOPY (EGD);  Surgeon: Charolett Bumpers, MD;  Location: [facility];  Service: Endoscopy;  Laterality: N/A;  . Balloon  dilation 12/26/2011    Procedure: BALLOON DILATION;  Surgeon: Charolett Bumpers, MD;  Location: [facility] ENDOSCOPY;  Service: Endoscopy;  Laterality: N/A;    Prior to Admission medications   Medication Sig Start Date End Date Taking? Authorizing Provider  albuterol (PROVENTIL HFA;VENTOLIN HFA) 108 (90 BASE) MCG/ACT inhaler Inhale 2 puffs into the lungs every 6 (six) hours as needed.   Yes Historical Provider, MD  aspirin EC 325 MG tablet Take 325 mg by mouth daily.    Yes Historical Provider, MD  budesonide-formoterol (SYMBICORT) 80-4.5 MCG/ACT inhaler Inhale 2 puffs into the lungs daily as needed. FOR WHEEZING   Yes Historical Provider, MD  ezetimibe-simvastatin (VYTORIN) 10-80 MG per tablet Take 1 tablet by mouth at bedtime.     Yes Historical Provider, MD  glimepiride (AMARYL) 2 MG tablet Take 2 mg by mouth daily.     Yes Historical Provider, MD  isosorbide mononitrate (IMDUR) 60 MG 24 hr tablet Take 60 mg by mouth at bedtime.    Yes Historical Provider, MD  levothyroxine (SYNTHROID, LEVOTHROID) 175 MCG tablet Take 175 mcg by mouth daily before breakfast.    Yes Historical Provider, MD  loperamide (IMODIUM A-D) 2 MG tablet Take 2 mg by mouth 4 (four) times daily as needed. For diarrhea.    Yes Historical Provider, MD  metFORMIN (GLUCOPHAGE) 500 MG tablet Take 500 mg by mouth 2 (two) times daily.    Yes Historical Provider, MD  metoprolol tartrate (LOPRESSOR) 25 MG tablet Take  25 mg by mouth daily.    Yes Historical Provider, MD  prasugrel (EFFIENT) 10 MG TABS Take 10 mg by mouth at bedtime.    Yes Historical Provider, MD  diazepam (VALIUM) 5 MG tablet Take 5 mg by mouth daily as needed. He takes only when having procedures done for anxiety.    Historical Provider, MD  furosemide (LASIX) 40 MG tablet Take 40 mg by mouth daily as needed. For swelling or fluid retention.     Historical Provider, MD  nitroGLYCERIN (NITROSTAT) 0.4 MG SL tablet Place 0.4 mg under the tongue every 5 (five) minutes x 3 doses  as needed. For chest pain.    Historical Provider, MD    Current Facility-Administered Medications  Medication Dose Route Frequency Provider Last Rate Last Dose  . 0.9 %  sodium chloride infusion  250 mL Intravenous PRN Dorothea Ogle, MD      . albuterol (PROVENTIL HFA;VENTOLIN HFA) 108 (90 BASE) MCG/ACT inhaler 2 puff  2 puff Inhalation Q6H PRN Dorothea Ogle, MD      . budesonide-formoterol (SYMBICORT) 80-4.5 MCG/ACT inhaler 2 puff  2 puff Inhalation BID Dorothea Ogle, MD      . diazepam (VALIUM) tablet 5 mg  5 mg Oral Daily PRN Dorothea Ogle, MD      . ezetimibe-simvastatin (VYTORIN) 10-80 MG per tablet 1 tablet  1 tablet Oral QHS Dorothea Ogle, MD      . furosemide (LASIX) tablet 40 mg  40 mg Oral Daily PRN Dorothea Ogle, MD      . isosorbide mononitrate (IMDUR) 24 hr tablet 60 mg  60 mg Oral QHS Dorothea Ogle, MD      . levothyroxine (SYNTHROID, LEVOTHROID) tablet 175 mcg  175 mcg Oral QAC breakfast Dorothea Ogle, MD      . loperamide (IMODIUM) capsule 2 mg  2 mg Oral QID PRN Dorothea Ogle, MD      . metFORMIN (GLUCOPHAGE) tablet 500 mg  500 mg Oral BID Dorothea Ogle, MD      . metoprolol tartrate (LOPRESSOR) tablet 25 mg  25 mg Oral Daily Dorothea Ogle, MD      . nitroGLYCERIN (NITROSTAT) SL tablet 0.4 mg  0.4 mg Sublingual Q5 Min x 3 PRN Dorothea Ogle, MD      . ondansetron [facility]) injection 4 mg  4 mg Intravenous Once Celene Kras, MD   4 mg at 08/12/12 1133  . ondansetron (ZOFRAN) tablet 4 mg  4 mg Oral Q6H PRN Dorothea Ogle, MD       Or  . ondansetron [facility]) injection 4 mg  4 mg Intravenous Q6H PRN Dorothea Ogle, MD      . pantoprazole (PROTONIX) injection 40 mg  40 mg Intravenous Once Celene Kras, MD   40 mg at 08/12/12 1133  . sodium chloride 0.9 % injection 3 mL  3 mL Intravenous Q12H Dorothea Ogle, MD      . sodium chloride 0.9 % injection 3 mL  3 mL Intravenous Q12H Dorothea Ogle, MD      . sodium chloride 0.9 % injection 3 mL  3 mL Intravenous PRN Dorothea Ogle, MD      .  DISCONTD: 0.9 %  sodium chloride infusion  1,000 mL Intravenous Continuous Celene Kras, MD 125 mL/hr at 08/12/12 1133 1,000 mL at 08/12/12 1133  . DISCONTD: aspirin EC tablet 325 mg  325 mg Oral  Daily Dorothea Ogle, MD      . DISCONTD: prasugrel (EFFIENT) tablet 10 mg  10 mg Oral QHS Dorothea Ogle, MD        Allergies as of 08/12/2012 - Review Complete 08/12/2012  Allergen Reaction Noted  . Clopidogrel bisulfate Itching     Family History  Problem Relation Age of Onset  . Malignant hyperthermia Neg Hx   . Diabetes Mother   . Hyperlipidemia Mother   . Hypertension Mother   . Heart attack Mother   . Cancer Sister   . Diabetes Sister   . Hyperlipidemia Sister   . Hypertension Sister   . Heart attack Sister   . Hyperlipidemia Sister   . Hypertension Sister     History   Social History  . Marital Status: Widowed    Spouse Name: N/A    Number of Children: N/A  . Years of Education: N/A   Occupational History  . Not on file.   Social History Main Topics  . Smoking status: Former Smoker -- 15 years    Types: Cigarettes, Cigars    Quit date: 10/14/1958  . Smokeless tobacco: Never Used  . Alcohol Use: No  . Drug Use: No  . Sexually Active: No   Other Topics Concern  . Not on file   Social History Narrative  . No narrative on file    Review of Systems: ROS from Dr. Verta Ellen 08/12/12 reviewed and I agree.  Physical Exam: Vital signs in last 24 hours: Temp:  [98.4 F (36.9 C)-98.7 F (37.1 C)] 98.7 F (37.1 C) (10/30 1525) Pulse Rate:  [70-85] 85  (10/30 1525) Resp:  [17-25] 18  (10/30 1525) BP: (104-138)/(37-69) 110/37 mmHg (10/30 1525) SpO2:  [93 %-96 %] 93 % (10/30 1525) Weight:  [88.134 kg (194 lb 4.8 oz)] 88.134 kg (194 lb 4.8 oz) (10/30 1525)   General:   Alert,  Well-developed, well-nourished, pleasant and cooperative in NAD, much younger-appearing than stated age Head:  Normocephalic and atraumatic. Eyes:  Sclera clear, no icterus.   Conjunctiva  pink. Ears:  Normal auditory acuity. Nose:  No deformity, discharge,  or lesions. Mouth:  No deformity or lesions.  Oropharynx pink but somewhat dry. Neck:  Supple; thick Lungs:  Clear throughout to auscultation.   No wheezes, crackles, or rhonchi. No acute distress. Heart:  Regular rate and rhythm; no murmurs, clicks, rubs,  or gallops. Abdomen:  Soft, nontender and nondistended. No masses, hepatosplenomegaly or hernias noted. Normal bowel sounds, without guarding, and without rebound.     Msk:  Symmetrical without gross deformities. Normal posture. Pulses:  Normal pulses noted. Extremities:  Without clubbing or edema. Neurologic:  Alert and  oriented x4;  Diffusely weak, but otherwise grossly normal neurologically. Skin:  Intact without significant lesions or rashes. Psych:  Alert and cooperative. Normal mood and affect.   Lab Results:  [facility] 08/12/12 1119  WBC 6.2  HGB 12.2*  HCT 36.1*  PLT 162   BMET  Basename 08/12/12 1119  NA 139  K 3.9  CL 101  CO2 30  GLUCOSE 109*  BUN 16  CREATININE 0.85  CALCIUM 9.5   LFT  Basename 08/12/12 1119  PROT 6.9  ALBUMIN 3.9  AST 19  ALT 14  ALKPHOS 40  BILITOT 0.3  BILIDIR --  IBILI --   PT/INR  Basename 08/12/12 1119  LABPROT 12.9  INR 0.98    Studies/Results: Dg Chest Portable 1 View  08/12/2012  *RADIOLOGY REPORT*  Clinical  Data: Shortness of breath.  Rectal bleeding.  PORTABLE CHEST - 1 VIEW  Comparison: 08/23/2009.  Findings: Granuloma left mid lung unchanged.  Heart size top normal.  Pulmonary vascular prominence most notable centrally without pulmonary edema.  No segmental consolidation or gross pneumothorax.  IMPRESSION: No acute abnormality.  Please see above.   Original Report Authenticated By: Fuller Canada, M.D.     Impression:  1.  GI bleeding in setting of chronic antiplatelet therapy (aspirin and Effient).  Source unclear, given both some black (more suggestive of upper tract) and red blood (more  suggestive of lower tract) per rectum.  He is hemodynamically stable.  No bleeding for the past few hours. 2.  Anemia, likely acute blood loss.  Plan:  1.  Protonix 40 mg iv Q 12 hours. 2.  Supportive care, including serial CBCs and intravenous hydration. 3.  If further rampant bleeding overnight, would do tagged RBC study overnight. 4.  NPO after midnight, in anticipation of possible procedure tomorrow. 5.  If no bleeding tonight, plan to do upper endoscopy tomorrow morning.    LOS: 0 days   Hyder Deman M  08/12/2012, 4:37 PM

## 2012-08-12 NOTE — ED Notes (Signed)
Pt had bloody stool.

## 2012-08-12 NOTE — H&P (Signed)
[facility] History and Physical  [name]:[id number] DOB: [birth year] DOA: 08/12/2012  Referring physician: ED physician PCP: Lupita Raider, MD   Chief Complaint: bleeding per rectum  HPI:  Pt is 78 yo male who presented to [facility] ED with main concern of sudden onset bright red blood per rectum, rather constant in nature that he initially noted night prior to admission. He denies any specific associated symptoms, no aggravating or alleviating factors. No constipation or diarrhea, no abdominal or urinary concerns. Pt also denies fevers, chills, no other systemic concerns, no similar events in the past.  Assessment and Plan: Bleeding per rectum - unclear etiology, pt is on Aspirin and Prasrugel  - FOBT + and pt reports persistent bleed - Hg and Hct stable on admission - will repeat CBC now and again on AM - GI consultation for further recommendations - admit to telemetry given multiple cardiac risk factors  Diabetes type II - will continue Metformin but will hold on Glimepiride   CAD - will have to hold of on Prasrugel and aspirin  COPD - stable - will continue to monitor vitals per protocol  Hypothyroidism - continue Synthroid  Code Status: Full Family Communication: Pt at bedside Disposition Plan: PT evaluation    Review of Systems:  Constitutional: Negative for fever, chills and malaise/fatigue. Negative for diaphoresis.  HENT: Negative for hearing loss, ear pain, nosebleeds, congestion, sore throat, neck pain, tinnitus and ear discharge.   Eyes: Negative for blurred vision, double vision, photophobia, pain, discharge and redness.  Respiratory: Negative for cough, hemoptysis, sputum production, shortness of breath, wheezing and stridor.   Cardiovascular: Negative for chest pain, palpitations, orthopnea, claudication and leg swelling.  Gastrointestinal: Negative for nausea, vomiting and abdominal pain. Negative for heartburn, constipation,  Positive for  blood in stool Genitourinary: Negative for dysuria, urgency, frequency, hematuria and flank pain.  Musculoskeletal: Negative for myalgias, back pain, joint pain and falls.  Skin: Negative for itching and rash.  Neurological: Negative for dizziness and weakness. Negative for tingling, tremors, sensory change, speech change, focal weakness, loss of consciousness and headaches.  Endo/Heme/Allergies: Negative for environmental allergies and polydipsia. Does not bruise/bleed easily.  Psychiatric/Behavioral: Negative for suicidal ideas. The patient is not nervous/anxious.      Past Medical History  Diagnosis Date  . Diabetes mellitus   . Asthma   . Coronary artery disease   . Hypertension   . Hypercholesterolemia   . Hypothyroidism   . Anxiety   . COPD (chronic obstructive pulmonary disease)   . GERD (gastroesophageal reflux disease)   . CHF (congestive heart failure)     diastolic   . Sleep apnea     severe OSA awaiting CPAP titration  . Peripheral vascular disease 11/2006    s/p left stent    Past Surgical History  Procedure Date  . Coronary stent placement 2010 and 2012  . Hernia repair 1992    evntral hernia repair  . Esophagogastroduodenoscopy 12/26/2011    Procedure: ESOPHAGOGASTRODUODENOSCOPY (EGD);  Surgeon: Charolett Bumpers, MD;  Location: [facility];  Service: Endoscopy;  Laterality: N/A;  . Balloon dilation 12/26/2011    Procedure: BALLOON DILATION;  Surgeon: Charolett Bumpers, MD;  Location: [facility];  Service: Endoscopy;  Laterality: N/A;    Social History:  reports that he quit smoking about 53 years ago. His smoking use included Cigarettes and Cigars. He quit after 15 years of use. He has never used smokeless tobacco. He reports that he does not drink alcohol or  use illicit drugs.  Allergies  Allergen Reactions  . Clopidogrel Bisulfate Itching    Family History  Problem Relation Age of Onset  . Malignant hyperthermia Neg Hx   . Diabetes Mother   .  Hyperlipidemia Mother   . Hypertension Mother   . Heart attack Mother   . Cancer Sister   . Diabetes Sister   . Hyperlipidemia Sister   . Hypertension Sister   . Heart attack Sister   . Hyperlipidemia Sister   . Hypertension Sister     Prior to Admission medications   Medication Sig Start Date End Date Taking? Authorizing Provider  albuterol (PROVENTIL HFA;VENTOLIN HFA) 108 (90 BASE) MCG/ACT inhaler Inhale 2 puffs into the lungs every 6 (six) hours as needed.   Yes Historical Provider, MD  aspirin EC 325 MG tablet Take 325 mg by mouth daily.    Yes Historical Provider, MD  budesonide-formoterol (SYMBICORT) 80-4.5 MCG/ACT inhaler Inhale 2 puffs into the lungs daily as needed. FOR WHEEZING   Yes Historical Provider, MD  ezetimibe-simvastatin (VYTORIN) 10-80 MG per tablet Take 1 tablet by mouth at bedtime.     Yes Historical Provider, MD  glimepiride (AMARYL) 2 MG tablet Take 2 mg by mouth daily.     Yes Historical Provider, MD  isosorbide mononitrate (IMDUR) 60 MG 24 hr tablet Take 60 mg by mouth at bedtime.    Yes Historical Provider, MD  levothyroxine (SYNTHROID, LEVOTHROID) 175 MCG tablet Take 175 mcg by mouth daily before breakfast.    Yes Historical Provider, MD  loperamide (IMODIUM A-D) 2 MG tablet Take 2 mg by mouth 4 (four) times daily as needed. For diarrhea.    Yes Historical Provider, MD  metFORMIN (GLUCOPHAGE) 500 MG tablet Take 500 mg by mouth 2 (two) times daily.    Yes Historical Provider, MD  metoprolol tartrate (LOPRESSOR) 25 MG tablet Take 25 mg by mouth daily.    Yes Historical Provider, MD  prasugrel (EFFIENT) 10 MG TABS Take 10 mg by mouth at bedtime.    Yes Historical Provider, MD  diazepam (VALIUM) 5 MG tablet Take 5 mg by mouth daily as needed. He takes only when having procedures done for anxiety.    Historical Provider, MD  furosemide (LASIX) 40 MG tablet Take 40 mg by mouth daily as needed. For swelling or fluid retention.     Historical Provider, MD  nitroGLYCERIN  (NITROSTAT) 0.4 MG SL tablet Place 0.4 mg under the tongue every 5 (five) minutes x 3 doses as needed. For chest pain.    Historical Provider, MD    Physical Exam: Filed Vitals:   08/12/12 1008  BP: 138/69  Pulse: 75  Temp: 98.4 F (36.9 C)  TempSrc: Oral  Resp: 25  SpO2: 96%    Physical Exam  Constitutional: Appears well-developed and well-nourished. No distress.  HENT: Normocephalic. External right and left ear normal. Oropharynx is clear and moist.  Eyes: Conjunctivae and EOM are normal. PERRLA, no scleral icterus.  Neck: Normal ROM. Neck supple. No JVD. No tracheal deviation. No thyromegaly.  CVS: RRR, S1/S2 +, no murmurs, no gallops, no carotid bruit.  Pulmonary: Effort and breath sounds normal, no stridor, rhonchi, wheezes, rales.  Abdominal: Soft. BS +,  no distension, tenderness, rebound or guarding.  Musculoskeletal: Normal range of motion. No edema and no tenderness.  Lymphadenopathy: No lymphadenopathy noted, cervical, inguinal. Neuro: Alert. Normal reflexes, muscle tone coordination. No cranial nerve deficit. Skin: Skin is warm and dry. No rash noted. Not diaphoretic. No  erythema. No pallor.  Psychiatric: Normal mood and affect. Behavior, judgment, thought content normal.   Labs on Admission:  Basic Metabolic Panel:  Lab 08/12/12 2956  NA 139  K 3.9  CL 101  CO2 30  GLUCOSE 109*  BUN 16  CREATININE 0.85  CALCIUM 9.5  MG --  PHOS --   Liver Function Tests:  Lab 08/12/12 1119  AST 19  ALT 14  ALKPHOS 40  BILITOT 0.3  PROT 6.9  ALBUMIN 3.9   No results found for this basename: LIPASE:5,AMYLASE:5 in the last 168 hours No results found for this basename: AMMONIA:5 in the last 168 hours CBC:  Lab 08/12/12 1119  WBC 6.2  NEUTROABS 3.6  HGB 12.2*  HCT 36.1*  MCV 86.6  PLT 162   Cardiac Enzymes: No results found for this basename: CKTOTAL:5,CKMB:5,CKMBINDEX:5,TROPONINI:5 in the last 168 hours BNP: No components found with this basename:  POCBNP:5 CBG:  Lab 08/12/12 1012  GLUCAP 99    Radiological Exams on Admission: Dg Chest Portable 1 View  08/12/2012  *RADIOLOGY REPORT*  Clinical Data: Shortness of breath.  Rectal bleeding.  PORTABLE CHEST - 1 VIEW  Comparison: 08/23/2009.  Findings: Granuloma left mid lung unchanged.  Heart size top normal.  Pulmonary vascular prominence most notable centrally without pulmonary edema.  No segmental consolidation or gross pneumothorax.  IMPRESSION: No acute abnormality.  Please see above.   Original Report Authenticated By: Fuller Canada, M.D.     EKG: Normal sinus rhythm, no ST/T wave changes  Debbora Presto, MD  [facility] Pager [phone]  If 7PM-7AM, please contact night-coverage [email or web address] Password [email or web address] 08/12/2012, 1:20 PM

## 2012-08-12 NOTE — ED Notes (Signed)
Pt states that last night, he felt like he needed to have a BM but went to the bathroom and just blood came out.  Pt states that there was a lot of dark red blood.  States that the water is bright red when he gets off the toilet.  He had another bloody stool this morning.  States that he feels weak.  Pt is A&O and was able to ambulate to triage room on his own. Vitals stable.

## 2012-08-12 NOTE — ED Provider Notes (Signed)
History     CSN: [id number] Arrival date & time 08/12/12  0865 First MD Initiated Contact with Patient 08/12/12 1033      Chief Complaint  Patient presents with  . Rectal Bleeding   [facility] HPI Comments: Per nursing notes "Pt states that last night, he felt like he needed to have a BM but went to the bathroom and just blood came out.  Pt states that there was a lot of dark red blood.  States that the water is bright red when he gets off the toilet.  He had another bloody stool this morning.  States that he feels weak."  Patient is a 78 y.o. male presenting with hematochezia. The history is provided by the patient.  Rectal Bleeding  The current episode started yesterday. The onset was sudden. The patient is experiencing no pain. The stool is described as bloody. Pertinent negatives include no anorexia, no fever, no abdominal pain, no nausea, no vomiting, no chest pain and no coughing.  Pt takes blood thinners.  Denies NSAIDS except asa.   Past Medical History  Diagnosis Date  . Diabetes mellitus   . Asthma   . Coronary artery disease   . Hypertension   . Hypercholesterolemia   . Hypothyroidism   . Anxiety   . COPD (chronic obstructive pulmonary disease)   . GERD (gastroesophageal reflux disease)   . CHF (congestive heart failure)     diastolic   . Sleep apnea     severe OSA awaiting CPAP titration  . Peripheral vascular disease 11/2006    s/p left stent    Past Surgical History  Procedure Date  . Coronary stent placement 2010 and 2012  . Hernia repair 1992    evntral hernia repair  . Esophagogastroduodenoscopy 12/26/2011    Procedure: ESOPHAGOGASTRODUODENOSCOPY (EGD);  Surgeon: Charolett Bumpers, MD;  Location: [facility];  Service: Endoscopy;  Laterality: N/A;  . Balloon dilation 12/26/2011    Procedure: BALLOON DILATION;  Surgeon: Charolett Bumpers, MD;  Location: [facility];  Service: Endoscopy;  Laterality: N/A;    Family History  Problem Relation Age of  Onset  . Malignant hyperthermia Neg Hx   . Diabetes Mother   . Hyperlipidemia Mother   . Hypertension Mother   . Heart attack Mother   . Cancer Sister   . Diabetes Sister   . Hyperlipidemia Sister   . Hypertension Sister   . Heart attack Sister   . Hyperlipidemia Sister   . Hypertension Sister     History  Substance Use Topics  . Smoking status: Former Smoker -- 15 years    Types: Cigarettes, Cigars    Quit date: 10/14/1958  . Smokeless tobacco: Never Used  . Alcohol Use: No      Review of Systems  Constitutional: Negative for fever.  Respiratory: Negative for cough and shortness of breath.   Cardiovascular: Negative for chest pain.  Gastrointestinal: Positive for hematochezia. Negative for nausea, vomiting, abdominal pain and anorexia.    Allergies  Clopidogrel bisulfate  Home Medications   Current Outpatient Rx  Name Route Sig Dispense Refill  . ALBUTEROL SULFATE HFA 108 (90 BASE) MCG/ACT IN AERS Inhalation Inhale 2 puffs into the lungs every 6 (six) hours as needed.    . ASPIRIN EC 325 MG PO TBEC Oral Take 325 mg by mouth daily.     . BUDESONIDE-FORMOTEROL FUMARATE 80-4.5 MCG/ACT IN AERO Inhalation Inhale 2 puffs into the lungs daily as needed. FOR WHEEZING    .  EZETIMIBE-SIMVASTATIN 10-80 MG PO TABS Oral Take 1 tablet by mouth at bedtime.      Marland Kitchen GLIMEPIRIDE 2 MG PO TABS Oral Take 2 mg by mouth daily.      . ISOSORBIDE MONONITRATE ER 60 MG PO TB24 Oral Take 60 mg by mouth at bedtime.     Marland Kitchen LEVOTHYROXINE SODIUM 175 MCG PO TABS Oral Take 175 mcg by mouth daily before breakfast.     . LOPERAMIDE HCL 2 MG PO TABS Oral Take 2 mg by mouth 4 (four) times daily as needed. For diarrhea.     . METFORMIN HCL 500 MG PO TABS Oral Take 500 mg by mouth 2 (two) times daily.     Marland Kitchen METOPROLOL TARTRATE 25 MG PO TABS Oral Take 25 mg by mouth daily.     Marland Kitchen PRASUGREL HCL 10 MG PO TABS Oral Take 10 mg by mouth at bedtime.     Marland Kitchen DIAZEPAM 5 MG PO TABS Oral Take 5 mg by mouth daily as  needed. He takes only when having procedures done for anxiety.    . FUROSEMIDE 40 MG PO TABS Oral Take 40 mg by mouth daily as needed. For swelling or fluid retention.     Marland Kitchen NITROGLYCERIN 0.4 MG SL SUBL Sublingual Place 0.4 mg under the tongue every 5 (five) minutes x 3 doses as needed. For chest pain.      BP 138/69  Pulse 75  Temp 98.4 F (36.9 C) (Oral)  Resp 25  SpO2 96%  Physical Exam  Nursing note and vitals reviewed. Constitutional: He appears well-developed and well-nourished. No distress.  HENT:  Head: Normocephalic and atraumatic.  Right Ear: External ear normal.  Left Ear: External ear normal.  Eyes: Conjunctivae normal are normal. Right eye exhibits no discharge. Left eye exhibits no discharge. No scleral icterus.  Neck: Neck supple. No tracheal deviation present.  Cardiovascular: Normal rate, regular rhythm and intact distal pulses.   Pulmonary/Chest: Effort normal and breath sounds normal. No stridor. No respiratory distress. He has no wheezes. He has no rales.  Abdominal: Soft. Bowel sounds are normal. He exhibits no distension. There is no tenderness. There is no rebound and no guarding.  Genitourinary: Rectal exam shows no mass and no tenderness. Guaiac positive stool.  Musculoskeletal: He exhibits no edema and no tenderness.  Neurological: He is alert. He has normal strength. No sensory deficit. Cranial nerve deficit:  no gross defecits noted. He exhibits normal muscle tone. He displays no seizure activity. Coordination normal.  Skin: Skin is warm and dry. No rash noted.  Psychiatric: He has a normal mood and affect.    ED Course  Procedures (including critical care time)  Labs Reviewed  COMPREHENSIVE METABOLIC PANEL - Abnormal; Notable for the following:    Glucose, Bld 109 (*)     GFR calc non Af Amer 80 (*)     All other components within normal limits  CBC WITH DIFFERENTIAL - Abnormal; Notable for the following:    RBC 4.17 (*)     Hemoglobin 12.2 (*)       HCT 36.1 (*)     All other components within normal limits  GLUCOSE, CAPILLARY  APTT  PROTIME-INR  TYPE AND SCREEN  OCCULT BLOOD, POC DEVICE  ABO/RH   Dg Chest Portable 1 View  08/12/2012  *RADIOLOGY REPORT*  Clinical Data: Shortness of breath.  Rectal bleeding.  PORTABLE CHEST - 1 VIEW  Comparison: 08/23/2009.  Findings: Granuloma left mid lung unchanged.  Heart  size top normal.  Pulmonary vascular prominence most notable centrally without pulmonary edema.  No segmental consolidation or gross pneumothorax.  IMPRESSION: No acute abnormality.  Please see above.   Original Report Authenticated By: Fuller Canada, M.D.       MDM  The patient presents with gastrointestinal bleeding. He remains hemodynamically stable and currently only has a mild anemia with hemoglobin of 12.2.  however he is on effient force was no definitive antidote. She will be admitted to the hospital for observation and further workup of his gastrointestinal bleeding. At this time, with the limited efficacy of platelet transfusion and the fact that he is not having any active bleeding at this time I will hold off on any administration of blood products        Celene Kras, MD 08/12/12 1245

## 2012-08-13 ENCOUNTER — Encounter (HOSPITAL_COMMUNITY)
Admission: EM | Disposition: A | Discharge status: Home / Self Care | Payer: Self-pay | Source: Home / Self Care | Attending: Internal Medicine

## 2012-08-13 ENCOUNTER — Encounter (HOSPITAL_COMMUNITY): Payer: Self-pay

## 2012-08-13 HISTORY — PX: ESOPHAGOGASTRODUODENOSCOPY: SHX5428

## 2012-08-13 LAB — CBC
MCH: 29.3 pg (ref 26.0–34.0)
MCHC: 33.2 g/dL (ref 30.0–36.0)
MCV: 88 fL (ref 78.0–100.0)
Platelets: 147 10*3/uL — ABNORMAL LOW (ref 150–400)
RDW: 14.2 % (ref 11.5–15.5)

## 2012-08-13 LAB — BASIC METABOLIC PANEL
CO2: 27 mEq/L (ref 19–32)
Chloride: 102 mEq/L (ref 96–112)
Creatinine, Ser: 1.01 mg/dL (ref 0.50–1.35)
Glucose, Bld: 127 mg/dL — ABNORMAL HIGH (ref 70–99)
Sodium: 138 mEq/L (ref 135–145)

## 2012-08-13 SURGERY — EGD (ESOPHAGOGASTRODUODENOSCOPY)
Anesthesia: Moderate Sedation | Laterality: Left

## 2012-08-13 MED ORDER — MIDAZOLAM HCL 10 MG/2ML IJ SOLN
INTRAMUSCULAR | Status: AC
  Filled 2012-08-13: qty 2

## 2012-08-13 MED ORDER — SODIUM CHLORIDE 0.9 % IV SOLN: INTRAVENOUS | Status: DC

## 2012-08-13 MED ORDER — BUTAMBEN-TETRACAINE-BENZOCAINE 2-2-14 % EX AERO
INHALATION_SPRAY | CUTANEOUS | Status: DC | PRN
  Administered 2012-08-13: 2 via TOPICAL

## 2012-08-13 MED ORDER — FENTANYL CITRATE 0.05 MG/ML IJ SOLN
INTRAMUSCULAR | Status: AC
  Filled 2012-08-13: qty 2

## 2012-08-13 MED ORDER — PEG 3350-KCL-NA BICARB-NACL 420 G PO SOLR
4000.0000 mL | Freq: Once | ORAL | Status: DC
  Filled 2012-08-13: qty 4000

## 2012-08-13 MED ORDER — BISACODYL 5 MG PO TBEC
10.0000 mg | DELAYED_RELEASE_TABLET | Freq: Once | ORAL | Status: AC
  Administered 2012-08-13: 10 mg via ORAL
  Filled 2012-08-13: qty 1
  Filled 2012-08-13: qty 2

## 2012-08-13 MED ORDER — FENTANYL CITRATE 0.05 MG/ML IJ SOLN
INTRAMUSCULAR | Status: DC | PRN
  Administered 2012-08-13 (×2): 25 ug via INTRAVENOUS

## 2012-08-13 MED ORDER — MIDAZOLAM HCL 10 MG/2ML IJ SOLN
INTRAMUSCULAR | Status: DC | PRN
  Administered 2012-08-13 (×2): 2 mg via INTRAVENOUS

## 2012-08-13 MED ORDER — PEG 3350-KCL-NA BICARB-NACL 420 G PO SOLR
4000.0000 mL | Freq: Once | ORAL | Status: AC
  Administered 2012-08-13: 4000 mL via ORAL
  Filled 2012-08-13: qty 4000

## 2012-08-13 MED ORDER — SODIUM CHLORIDE 0.9 % IV SOLN
INTRAVENOUS | Status: DC
  Administered 2012-08-13: 500 mL via INTRAVENOUS

## 2012-08-13 NOTE — Evaluation (Signed)
Physical Therapy Evaluation Patient Details Name: [name] MRN: [id number] DOB: 1930-06-22 Today's Date: 08/13/2012 Time: 2952-8413 PT Time Calculation (min): 12 min  PT Assessment / Plan / Recommendation Clinical Impression  78 y.o. male admitted with rectal bleeding. Work up pending. Pt has no deficits with mobility. He ambulated 300' without assistive device, no LOB. No further PT indicated.     PT Assessment  Patent does not need any further PT services    Follow Up Recommendations  No PT follow up    Does the patient have the potential to tolerate intense rehabilitation      Barriers to Discharge        Equipment Recommendations  None recommended by PT    Recommendations for Other Services     Frequency      Precautions / Restrictions Precautions Precautions: None Restrictions Weight Bearing Restrictions: No   Pertinent Vitals/Pain *Pt denies pain**      Mobility  Bed Mobility Bed Mobility: Supine to Sit Supine to Sit: 7: Independent Transfers Transfers: Sit to Stand;Stand to Sit Sit to Stand: 7: Independent Stand to Sit: 7: Independent Ambulation/Gait Ambulation/Gait Assistance: 7: Independent Ambulation Distance (Feet): 300 Feet Assistive device: None Gait Pattern: Within Functional Limits General Gait Details: No LOB, no deficits     Shoulder Instructions     Exercises     PT Diagnosis:    PT Problem List:   PT Treatment Interventions:     PT Goals    Visit Information  Last PT Received On: 08/13/12 Assistance Needed: +1    Subjective Data  Subjective: I'm 78 years old and have never had a problem like this.  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: No difficulties    Cognition  Overall Cognitive Status: Appears within functional limits for tasks assessed/performed Arousal/Alertness: Awake/alert Orientation Level: Appears intact for tasks assessed Behavior During Session: [facility] for tasks performed    Extremity/Trunk Assessment Right Upper Extremity Assessment RUE ROM/Strength/Tone: [facility] for tasks assessed Left Upper Extremity Assessment LUE ROM/Strength/Tone: WFL for tasks assessed Right Lower Extremity Assessment RLE ROM/Strength/Tone: WFL for tasks assessed RLE Sensation: WFL - Light Touch RLE Coordination: WFL - gross/fine motor Left Lower Extremity Assessment LLE ROM/Strength/Tone: WFL for tasks assessed LLE Sensation: WFL - Light Touch LLE Coordination: WFL - gross/fine motor Trunk Assessment Trunk Assessment: Normal   Balance    End of Session PT - End of Session Activity Tolerance: Patient tolerated treatment well Patient left: in chair Nurse Communication: Mobility status  GP     Ralene Bathe Kistler 08/13/2012, 9:53 AM  [phone]

## 2012-08-13 NOTE — Op Note (Signed)
[facility] [address] [place], [address]   ENDOSCOPY PROCEDURE REPORT  PATIENT: [name], [name]  MR#: [id number] BIRTHDATE: [birth year] , 82  yrs. old GENDER: Male ENDOSCOPIST: Willis Modena, MD REFERRED BY:  Lupita Raider, M.D. PROCEDURE DATE:  08/13/2012 PROCEDURE:  EGD, diagnostic ASA CLASS:     Class III INDICATIONS: MEDICATIONS: Fentanyl 50 mcg IV and Versed 4 mg IV TOPICAL ANESTHETIC: Cetacaine Spray  DESCRIPTION OF PROCEDURE: After the risks benefits and alternatives of the procedure were thoroughly explained, informed consent was obtained.  The Pentax Gastroscope [id number] endoscope was introduced through the mouth and advanced to the second portion of the duodenum. Without limitations.  The instrument was slowly withdrawn as the mucosa was fully examined.     Findings:  Normal esophagus.  Some linear erythema with small associated serpiginous erosions in the gastric body. Otherwise normal stomach and pylorus.  Mild bulbar duodenitis, otherwise normal duodenum to the second portion.  Retroflexed views into cardia revealed no abnormalities.    No old or fresh blood seen to the extent of our examination. The scope was then withdrawn from the patient and the procedure completed.  ENDOSCOPIC IMPRESSION:     As above.  No clear source of blood in stool.  RECOMMENDATIONS:     1.  Watch for potential complications of procedure. 2.  Given need for chronic antiplatelet therapy, and no clear source for patient's bleeding, will proceed with colonoscopy tomorrow. 3.  Follow-up with inpatient [facility] team.  eSigned:  Willis Modena, MD 08/13/2012 11:28 AM   CC:

## 2012-08-13 NOTE — Interval H&P Note (Signed)
History and Physical Interval Note:  08/13/2012 10:53 AM  [name]  has presented today for surgery, with the diagnosis of blood in stool, anemia  The various methods of treatment have been discussed with the patient and family. After consideration of risks, benefits and other options for treatment, the patient has consented to  Procedure(s) (LRB) with comments: ESOPHAGOGASTRODUODENOSCOPY (EGD) (Left) as a surgical intervention .  The patient's history has been reviewed, patient examined, no change in status, stable for surgery.  I have reviewed the patient's chart and labs.  Questions were answered to the patient's satisfaction.     Rosalie Gelpi M  Assessment:  1. Blood in stool with acute blood loss anemia (mild).  No further bleeding since admission.  Plan:  1.  Upper endoscopy with possible application of hemostatic therapy. 2.  Risks (bleeding, infection, bowel perforation that could require surgery, sedation-related changes in cardiopulmonary systems), benefits (identification and possible treatment of source of symptoms, exclusion of certain causes of symptoms), and alternatives (watchful waiting, radiographic imaging studies, empiric medical treatment) of upper endoscopy (EGD) were explained to patient in detail and he wishes to proceed.

## 2012-08-13 NOTE — Progress Notes (Signed)
Patient ID: [name], male   DOB: 08/01/1930, 78 y.o.   MRN: [id number]  [facility] PROGRESS NOTE  Zameer D [place]okston MRN:[id number] DOB: 03/25/1930 DOA: 08/12/2012 PCP: SHAW,KIMBERLEE, MD  Brief narrative: Pt is 78 yo male who presented to [facility] ED with main concern of sudden onset bright red blood per rectum, rather constant in nature that he initially noted night prior to admission. He denies any specific associated symptoms, no aggravating or alleviating factors. No constipation or diarrhea, no abdominal or urinary concerns. Pt also denies fevers, chills, no other systemic concerns, no similar events in the past.   Assessment and Plan:  Bleeding per rectum  - unclear etiology, pt is on Aspirin and Prasrugel  - FOBT + in ED - Hg and Hct stable on admission  - Endoscopy planned for today - will follow up on GI recommendations - CBC In AM Diabetes type II  - will continue Metformin but will hold on Glimepiride  - monitor CBG per protocol and readjust the regimen as indicated CAD  - will have to hold of on Prasrugel and aspirin  COPD  - stable  - will continue to monitor vitals per protocol  Hypothyroidism  - continue Synthroid  Hypertension - slightly hypotensive this AM - monitor per protocol  Consultants:  GI  Procedures/Studies: Dg Chest Portable 1 View 08/12/2012  No acute abnormality.    Antibiotics:  None  Code Status: Full Family Communication: Pt at bedside Disposition Plan: Home when medically stable  HPI/Subjective: No events overnight.   Objective: Filed Vitals:   08/13/12 1110 08/13/12 1115 08/13/12 1122 08/13/12 1125  BP: 99/50 94/39  125/61  Pulse:      Temp:      TempSrc:      Resp: 12 38 14 12  Height:      Weight:      SpO2: 98% 99% 98% 99%    Intake/Output Summary (Last 24 hours) at 08/13/12 1128 Last data filed at 08/13/12 0930  Gross per 24 hour  Intake    243 ml  Output   1125 ml  Net   -882 ml    Exam:   General:   Pt is alert, follows commands appropriately, not in acute distress  Cardiovascular: Regular rate and rhythm, S1/S2, no murmurs, no rubs, no gallops  Respiratory: Clear to auscultation bilaterally, no wheezing, no crackles, no rhonchi  Abdomen: Soft, non tender, non distended, bowel sounds present, no guarding  Extremities: No edema, pulses DP and PT palpable bilaterally  Neuro: Grossly nonfocal  Data Reviewed: Basic Metabolic Panel:  Lab 08/13/12 0500 08/12/12 1119  NA 138 139  K 3.7 3.9  CL 102 101  CO2 27 30  GLUCOSE 127* 109*  BUN 14 16  CREATININE 1.01 0.85  CALCIUM 8.9 9.5  MG -- --  PHOS -- --   Liver Function Tests:  Lab 08/12/12 1119  AST 19  ALT 14  ALKPHOS 40  BILITOT 0.3  PROT 6.9  ALBUMIN 3.9   No results found for this basename: LIPASE:5,AMYLASE:5 in the last 168 hours No results found for this basename: AMMONIA:5 in the last 168 hours CBC:  Lab 08/13/12 0510 08/12/12 2322 08/12/12 1733 08/12/12 1119  WBC 7.2 8.3 7.9 6.2  NEUTROABS -- -- -- 3.6  HGB 11.0* 11.4* 12.1* 12.2*  HCT 33.1* 33.4* 36.6* 36.1*  MCV 88.0 86.8 86.7 86.6  PLT 147* 158 164 162   Cardiac Enzymes: No results found for this basename: CKTOTAL:5,CKMB:5,CKMBINDEX:5,TROPONINI:5   in the last 168 hours BNP: No components found with this basename: POCBNP:5 CBG:  Lab 08/13/12 0805 08/12/12 1012  GLUCAP 130* 99    No results found for this or any previous visit (from the past 240 hour(s)).   Scheduled Meds:   . budesonide-formoterol  2 puff Inhalation BID  . ezetimibe-simvastatin  1 tablet Oral QHS  . isosorbide mononitrate  60 mg Oral QHS  . levothyroxine  175 mcg Oral QAC breakfast  . metFORMIN  500 mg Oral BID  . metoprolol tartrate  25 mg Oral Daily  . ondansetron (ZOFRAN) IV  4 mg Intravenous Once  . pantoprazole (PROTONIX) IV  40 mg Intravenous Once  . pantoprazole (PROTONIX) IV  40 mg Intravenous Q12H  . sodium chloride  3 mL Intravenous Q12H  . sodium chloride  3 mL  Intravenous Q12H  . DISCONTD: aspirin EC  325 mg Oral Daily  . DISCONTD: prasugrel  10 mg Oral QHS   Continuous Infusions:   . sodium chloride 500 mL (08/13/12 1017)  . DISCONTD: sodium chloride 1,000 mL (08/12/12 1133)     MAGICK-Sheila Ocasio, MD  [facility] Pager [phone]  If 7PM-7AM, please contact night-coverage [email or web address] Password [email or web address] 08/13/2012, 11:28 AM   LOS: 1 day      

## 2012-08-14 ENCOUNTER — Encounter (HOSPITAL_COMMUNITY)
Admission: EM | Disposition: A | Discharge status: Home / Self Care | Payer: Self-pay | Source: Home / Self Care | Attending: Internal Medicine

## 2012-08-14 ENCOUNTER — Encounter (HOSPITAL_COMMUNITY): Payer: Self-pay

## 2012-08-14 ENCOUNTER — Encounter (HOSPITAL_COMMUNITY): Payer: Self-pay | Admitting: Gastroenterology

## 2012-08-14 DIAGNOSIS — J45909 Unspecified asthma, uncomplicated: Secondary | ICD-10-CM

## 2012-08-14 HISTORY — PX: COLONOSCOPY: SHX5424

## 2012-08-14 LAB — BASIC METABOLIC PANEL
BUN: 9 mg/dL (ref 6–23)
CO2: 29 mEq/L (ref 19–32)
Calcium: 9.2 mg/dL (ref 8.4–10.5)
Chloride: 103 mEq/L (ref 96–112)
Creatinine, Ser: 0.85 mg/dL (ref 0.50–1.35)
Glucose, Bld: 102 mg/dL — ABNORMAL HIGH (ref 70–99)

## 2012-08-14 LAB — CBC
HCT: 34.1 % — ABNORMAL LOW (ref 39.0–52.0)
Hemoglobin: 11.2 g/dL — ABNORMAL LOW (ref 13.0–17.0)
MCH: 28.9 pg (ref 26.0–34.0)
MCV: 87.9 fL (ref 78.0–100.0)
RBC: 3.88 MIL/uL — ABNORMAL LOW (ref 4.22–5.81)

## 2012-08-14 SURGERY — COLONOSCOPY
Anesthesia: Moderate Sedation

## 2012-08-14 MED ORDER — FENTANYL CITRATE 0.05 MG/ML IJ SOLN
INTRAMUSCULAR | Status: AC
  Filled 2012-08-14: qty 4

## 2012-08-14 MED ORDER — MIDAZOLAM HCL 10 MG/2ML IJ SOLN
INTRAMUSCULAR | Status: AC
  Filled 2012-08-14: qty 4

## 2012-08-14 MED ORDER — ONDANSETRON HCL 4 MG PO TABS: 4.0000 mg | ORAL_TABLET | Freq: Four times a day (QID) | ORAL | Status: DC | PRN

## 2012-08-14 MED ORDER — DIAZEPAM 5 MG PO TABS: 5.0000 mg | ORAL_TABLET | Freq: Every day | ORAL | Status: DC | PRN

## 2012-08-14 MED ORDER — MIDAZOLAM HCL 5 MG/5ML IJ SOLN
INTRAMUSCULAR | Status: DC | PRN
  Administered 2012-08-14 (×3): 2 mg via INTRAVENOUS

## 2012-08-14 MED ORDER — SODIUM CHLORIDE 0.9 % IV SOLN: INTRAVENOUS | Status: DC

## 2012-08-14 MED ORDER — FENTANYL CITRATE 0.05 MG/ML IJ SOLN
INTRAMUSCULAR | Status: DC | PRN
  Administered 2012-08-14 (×3): 25 ug via INTRAVENOUS

## 2012-08-14 NOTE — Interval H&P Note (Signed)
History and Physical Interval Note:  08/14/2012 10:34 AM  [name]  has presented today for surgery, with the diagnosis of blood in stool, anemia, negative upper endoscopy  The various methods of treatment have been discussed with the patient and family. After consideration of risks, benefits and other options for treatment, the patient has consented to  Procedure(s) (LRB) with comments: COLONOSCOPY (N/A) as a surgical intervention .  The patient's history has been reviewed, patient examined, no change in status, stable for surgery.  I have reviewed the patient's chart and labs.  Questions were answered to the patient's satisfaction.     Shashwat Cleary M  Assessment:  1.  Anemia.  2.  Blood in stool.  Negative endoscopy.  Plan:   1.  Colonoscopy. 2.  Risks (bleeding, infection, bowel perforation that could require surgery, sedation-related changes in cardiopulmonary systems), benefits (identification and possible treatment of source of symptoms, exclusion of certain causes of symptoms), and alternatives (watchful waiting, radiographic imaging studies, empiric medical treatment) of colonoscopy were explained to patient in detail and he wishes to proceed.

## 2012-08-14 NOTE — Progress Notes (Signed)
Alert and oriented. Vital signs stable. Saline lock removed. Telemetry D/C'ed. Discharge instructions given. Prescriptions called to [facility]. Patient verbalized understanding of instructions. Left floor via wheelchair with nursing staff and family member.  Discharged home.  Bjorn Pippin M 08/14/2012 1300

## 2012-08-14 NOTE — Care Management Note (Signed)
    Page 1 of 1   08/14/2012     3:45:45 PM   CARE MANAGEMENT NOTE 08/14/2012  Patient:  HAWLEY, MICHEL   Account Number:  [id number]  Date Initiated:  08/14/2012  Documentation initiated by:  Lanier Clam  Subjective/Objective Assessment:   ADMITTED W/GIB.     Action/Plan:   FROM HOME   Anticipated DC Date:  08/14/2012   Anticipated DC Plan:  HOME/SELF CARE      DC Planning Services  CM consult      Choice offered to / List presented to:             Status of service:  Completed, signed off Medicare Important Message given?   (If response is "NO", the following Medicare IM given date fields will be blank) Date Medicare IM given:   Date Additional Medicare IM given:    Discharge Disposition:  HOME/SELF CARE  Per UR Regulation:  Reviewed for med. necessity/level of care/duration of stay  If discussed at Long Length of Stay Meetings, dates discussed:    Comments:  08/14/12 [facility] Khye Hochstetler RN,BSN NCM [phone]

## 2012-08-14 NOTE — H&P (View-Only) (Signed)
Patient ID: [name], male   DOB: Mar 01, 1930, 78 y.o.   MRN: [id number]  [facility] PROGRESS NOTE  ERHARDT MANCIL AOZ:[id number] DOB: 01/19/1930 DOA: 08/12/2012 PCP: Lupita Raider, MD  Brief narrative: Pt is 78 yo male who presented to [facility] ED with main concern of sudden onset bright red blood per rectum, rather constant in nature that he initially noted night prior to admission. He denies any specific associated symptoms, no aggravating or alleviating factors. No constipation or diarrhea, no abdominal or urinary concerns. Pt also denies fevers, chills, no other systemic concerns, no similar events in the past.   Assessment and Plan:  Bleeding per rectum  - unclear etiology, pt is on Aspirin and Prasrugel  - FOBT + in ED - Hg and Hct stable on admission  - Endoscopy planned for today - will follow up on GI recommendations - CBC In AM Diabetes type II  - will continue Metformin but will hold on Glimepiride  - monitor CBG per protocol and readjust the regimen as indicated CAD  - will have to hold of on Prasrugel and aspirin  COPD  - stable  - will continue to monitor vitals per protocol  Hypothyroidism  - continue Synthroid  Hypertension - slightly hypotensive this AM - monitor per protocol  Consultants:  GI  Procedures/Studies: Dg Chest Portable 1 View 08/12/2012  No acute abnormality.    Antibiotics:  None  Code Status: Full Family Communication: Pt at bedside Disposition Plan: Home when medically stable  HPI/Subjective: No events overnight.   Objective: Filed Vitals:   08/13/12 1110 08/13/12 1115 08/13/12 1122 08/13/12 1125  BP: 99/50 94/39  125/61  Pulse:      Temp:      TempSrc:      Resp: 12 38 14 12  Height:      Weight:      SpO2: 98% 99% 98% 99%    Intake/Output Summary (Last 24 hours) at 08/13/12 1128 Last data filed at 08/13/12 0930  Gross per 24 hour  Intake    243 ml  Output   1125 ml  Net   -882 ml    Exam:   General:   Pt is alert, follows commands appropriately, not in acute distress  Cardiovascular: Regular rate and rhythm, S1/S2, no murmurs, no rubs, no gallops  Respiratory: Clear to auscultation bilaterally, no wheezing, no crackles, no rhonchi  Abdomen: Soft, non tender, non distended, bowel sounds present, no guarding  Extremities: No edema, pulses DP and PT palpable bilaterally  Neuro: Grossly nonfocal  Data Reviewed: Basic Metabolic Panel:  Lab 08/13/12 9629 08/12/12 1119  NA 138 139  K 3.7 3.9  CL 102 101  CO2 27 30  GLUCOSE 127* 109*  BUN 14 16  CREATININE 1.01 0.85  CALCIUM 8.9 9.5  MG -- --  PHOS -- --   Liver Function Tests:  Lab 08/12/12 1119  AST 19  ALT 14  ALKPHOS 40  BILITOT 0.3  PROT 6.9  ALBUMIN 3.9   No results found for this basename: LIPASE:5,AMYLASE:5 in the last 168 hours No results found for this basename: AMMONIA:5 in the last 168 hours CBC:  Lab 08/13/12 0510 08/12/12 2322 08/12/12 1733 08/12/12 1119  WBC 7.2 8.3 7.9 6.2  NEUTROABS -- -- -- 3.6  HGB 11.0* 11.4* 12.1* 12.2*  HCT 33.1* 33.4* 36.6* 36.1*  MCV 88.0 86.8 86.7 86.6  PLT 147* 158 164 162   Cardiac Enzymes: No results found for this basename: CKTOTAL:5,CKMB:5,CKMBINDEX:5,TROPONINI:5  in the last 168 hours BNP: No components found with this basename: POCBNP:5 CBG:  Lab 08/13/12 0805 08/12/12 1012  GLUCAP 130* 99    No results found for this or any previous visit (from the past 240 hour(s)).   Scheduled Meds:   . budesonide-formoterol  2 puff Inhalation BID  . ezetimibe-simvastatin  1 tablet Oral QHS  . isosorbide mononitrate  60 mg Oral QHS  . levothyroxine  175 mcg Oral QAC breakfast  . metFORMIN  500 mg Oral BID  . metoprolol tartrate  25 mg Oral Daily  . ondansetron (ZOFRAN) IV  4 mg Intravenous Once  . pantoprazole (PROTONIX) IV  40 mg Intravenous Once  . pantoprazole (PROTONIX) IV  40 mg Intravenous Q12H  . sodium chloride  3 mL Intravenous Q12H  . sodium chloride  3 mL  Intravenous Q12H  . DISCONTD: aspirin EC  325 mg Oral Daily  . DISCONTD: prasugrel  10 mg Oral QHS   Continuous Infusions:   . sodium chloride 500 mL (08/13/12 1017)  . DISCONTD: sodium chloride 1,000 mL (08/12/12 1133)     Debbora Presto, MD  [facility] Pager [phone]  If 7PM-7AM, please contact night-coverage [email or web address] Password [email or web address] 08/13/2012, 11:28 AM   LOS: 1 day

## 2012-08-14 NOTE — Discharge Instructions (Signed)
Diverticulosis Diverticulosis is a common condition that develops when small pouches (diverticula) form in the wall of the colon. The risk of diverticulosis increases with age. It happens more often in people who eat a low-fiber diet. Most individuals with diverticulosis have no symptoms. Those individuals with symptoms usually experience abdominal pain, constipation, or loose stools (diarrhea). HOME CARE INSTRUCTIONS   Increase the amount of fiber in your diet as directed by your caregiver or dietician. This may reduce symptoms of diverticulosis.  Your caregiver may recommend taking a dietary fiber supplement.  Drink at least 6 to 8 glasses of water each day to prevent constipation.  Try not to strain when you have a bowel movement.  Your caregiver may recommend avoiding nuts and seeds to prevent complications, although this is still an uncertain benefit.  Only take over-the-counter or prescription medicines for pain, discomfort, or fever as directed by your caregiver. FOODS WITH HIGH FIBER CONTENT INCLUDE:  Fruits. Apple, peach, pear, tangerine, raisins, prunes.  Vegetables. Brussels sprouts, asparagus, broccoli, cabbage, carrot, cauliflower, romaine lettuce, spinach, summer squash, tomato, winter squash, zucchini.  Starchy Vegetables. Baked beans, kidney beans, lima beans, split peas, lentils, potatoes (with skin).  Grains. Whole wheat bread, brown rice, bran flake cereal, plain oatmeal, white rice, shredded wheat, bran muffins. SEEK IMMEDIATE MEDICAL CARE IF:   You develop increasing pain or severe bloating.  You have an oral temperature above 102 F (38.9 C), not controlled by medicine.  You develop vomiting or bowel movements that are bloody or black. Document Released: 06/27/2004 Document Revised: 12/23/2011 Document Reviewed: 02/28/2010 [employer] Patient Information 2013 [employer].  

## 2012-08-14 NOTE — Discharge Summary (Signed)
Physician Discharge Summary  [name] ZOX:[id number] DOB: 03/12/1930 DOA: 08/12/2012  PCP: Lupita Raider, MD  Admit date: 08/12/2012 Discharge date: 08/14/2012  Recommendations for Outpatient Follow-up:  1. Pt will need to follow up with PCP in 2-3 weeks post discharge 2. Please obtain BMP to evaluate electrolytes and kidney function 3. Please also check CBC to evaluate Hg and Hct levels 4. Pt will also need to follow up with Dr. Dulce Sellar in 3-4 weeks post discharge  Discharge Diagnoses: Acute blood loss anemia secondary to diverticulosis   Discharge Condition: Stable  Diet recommendation: Heart healthy diet discussed in details   Brief narrative:  Pt is 78 yo male who presented to [facility] ED with main concern of sudden onset bright red blood per rectum, rather constant in nature that he initially noted night prior to admission. He denies any specific associated symptoms, no aggravating or alleviating factors. No constipation or diarrhea, no abdominal or urinary concerns. Pt also denies fevers, chills, no other systemic concerns, no similar events in the past.   Assessment and Plan:  Bleeding per rectum  - in pt on Aspirin and Prasrugel  - FOBT + in ED  - Hg and Hct stable on admission  - Endoscopy unremarkable for acute source of bleeding and colonoscopy indicative of diverticulosis - no bleeding during the hospital stay  - Hg and Hct have remained stable during the hospital stay Diabetes type II  - will continue Metformin but held Glimepiride  - monitored CBG per protocol  CAD  - per GI, pt can continue taking Prasrugel and aspirin upon discharge COPD  - stable  Hypothyroidism  - continue Synthroid  Hypertension  - stable during the hospital stay  Consultants:  GI - Dr. Dulce Sellar   Procedures/Studies:  Colonoscopy 11/01 --> Diverticulosis, no other acute findings  Dg Chest Portable 1 View  08/12/2012  No acute abnormality.   Antibiotics:  None  Discharge  Exam: Filed Vitals:   08/14/12 1224  BP: 145/94  Pulse: 82  Temp: 97.5 F (36.4 C)  Resp: 20   Filed Vitals:   08/14/12 1130 08/14/12 1137 08/14/12 1145 08/14/12 1224  BP: 90/47 90/47 117/61 145/94  Pulse:    82  Temp:    97.5 F (36.4 C)  TempSrc:    Oral  Resp: 12 13 16 20   Height:      Weight:      SpO2: 100% 100% 98% 99%    General: Pt is alert, follows commands appropriately, not in acute distress Cardiovascular: Regular rate and rhythm, S1/S2 +, no murmurs, no rubs, no gallops Respiratory: Clear to auscultation bilaterally, no wheezing, no crackles, no rhonchi Abdominal: Soft, non tender, non distended, bowel sounds +, no guarding Extremities: no edema, no cyanosis, pulses palpable bilaterally DP and PT Neuro: Grossly nonfocal  Discharge Instructions  Discharge Orders    Future Appointments: Provider: Department: Dept Phone: Center:   11/20/2012 3:30 PM [facility] [phone] [facility]   11/20/2012 4:00 PM Evern Bio, NP [facility] [phone] [facility]   05/21/2013 8:30 AM [facility] [phone] [facility]   05/21/2013 9:40 AM Evern Bio, NP [facility] [phone] [facility]     Future Orders Please Complete By Expires   Diet - low sodium heart healthy      Increase activity slowly          Medication List     As of 08/14/2012 12:35 PM    TAKE these medications  albuterol 108 (90 BASE) MCG/ACT inhaler   Commonly known as: PROVENTIL HFA;VENTOLIN HFA   Inhale 2 puffs into the lungs every 6 (six) hours as needed.      aspirin EC 325 MG tablet   Take 325 mg by mouth daily.      budesonide-formoterol 80-4.5 MCG/ACT inhaler   Commonly known as: SYMBICORT   Inhale 2 puffs into the lungs daily as needed. FOR WHEEZING      diazepam 5 MG tablet   Commonly known as: VALIUM   Take 1 tablet (5 mg total) by mouth daily as needed. He takes only when having procedures done for anxiety.      EFFIENT 10 MG Tabs   Generic drug:  prasugrel   Take 10 mg by mouth at bedtime.      ezetimibe-simvastatin 10-80 MG per tablet   Commonly known as: VYTORIN   Take 1 tablet by mouth at bedtime.      furosemide 40 MG tablet   Commonly known as: LASIX   Take 40 mg by mouth daily as needed. For swelling or fluid retention.      glimepiride 2 MG tablet   Commonly known as: AMARYL   Take 2 mg by mouth daily.      isosorbide mononitrate 60 MG 24 hr tablet   Commonly known as: IMDUR   Take 60 mg by mouth at bedtime.      levothyroxine 175 MCG tablet   Commonly known as: SYNTHROID, LEVOTHROID   Take 175 mcg by mouth daily before breakfast.      loperamide 2 MG tablet   Commonly known as: IMODIUM A-D   Take 2 mg by mouth 4 (four) times daily as needed. For diarrhea.      metFORMIN 500 MG tablet   Commonly known as: GLUCOPHAGE   Take 500 mg by mouth 2 (two) times daily.      metoprolol tartrate 25 MG tablet   Commonly known as: LOPRESSOR   Take 25 mg by mouth daily.      nitroGLYCERIN 0.4 MG SL tablet   Commonly known as: NITROSTAT   Place 0.4 mg under the tongue every 5 (five) minutes x 3 doses as needed. For chest pain.      ondansetron 4 MG tablet   Commonly known as: ZOFRAN   Take 1 tablet (4 mg total) by mouth every 6 (six) hours as needed for nausea.           Follow-up Information    Follow up with SHAW,KIMBERLEE, MD. In 2 weeks.   Contact information:   [address] [place] [address] [phone]       Follow up with Freddy Jaksch, MD. In 2 weeks.   Contact information:   [address] [place] [address] [phone]           The results of significant diagnostics from this hospitalization (including imaging, microbiology, ancillary and laboratory) are listed below for reference.     Microbiology: No results found for this or any previous visit (from the past 240 hour(s)).   Labs: Basic Metabolic Panel:  Lab 08/14/12 5784 08/13/12 0500  08/12/12 1119  NA 139 138 139  K 3.7 3.7 3.9  CL 103 102 101  CO2 29 27 30   GLUCOSE 102* 127* 109*  BUN 9 14 16   CREATININE 0.85 1.01 0.85  CALCIUM 9.2 8.9 9.5  MG -- -- --  PHOS -- -- --  Liver Function Tests:  Lab 08/12/12 1119  AST 19  ALT 14  ALKPHOS 40  BILITOT 0.3  PROT 6.9  ALBUMIN 3.9   No results found for this basename: LIPASE:5,AMYLASE:5 in the last 168 hours No results found for this basename: AMMONIA:5 in the last 168 hours CBC:  Lab 08/14/12 0502 08/13/12 0510 08/12/12 2322 08/12/12 1733 08/12/12 1119  WBC 7.1 7.2 8.3 7.9 6.2  NEUTROABS -- -- -- -- 3.6  HGB 11.2* 11.0* 11.4* 12.1* 12.2*  HCT 34.1* 33.1* 33.4* 36.6* 36.1*  MCV 87.9 88.0 86.8 86.7 86.6  PLT 143* 147* 158 164 162   Cardiac Enzymes: No results found for this basename: CKTOTAL:5,CKMB:5,CKMBINDEX:5,TROPONINI:5 in the last 168 hours BNP: BNP (last 3 results) No results found for this basename: PROBNP:3 in the last 8760 hours CBG:  Lab 08/14/12 0806 08/13/12 0805 08/12/12 1012  GLUCAP 107* 130* 99     SIGNED: Time coordinating discharge: Over 30 minutes  Debbora Presto, MD  [facility] 08/14/2012, 12:35 PM Pager [phone]  If 7PM-7AM, please contact night-coverage [email or web address] Password [email or web address]

## 2012-08-17 ENCOUNTER — Encounter (HOSPITAL_COMMUNITY): Payer: Self-pay | Admitting: Gastroenterology

## 2012-08-19 NOTE — Op Note (Signed)
[facility] [address] [place], [address]   COLONOSCOPY PROCEDURE REPORT  PATIENT: [name], [name]  MR#: [id number] BIRTHDATE: 10/20/1929 , 82  yrs. old GENDER: Male ENDOSCOPIST: Willis Modena, MD REFERRED XB:[id number] Shaw, M.D. PROCEDURE DATE:  08/14/2012 PROCEDURE:   Colonoscopy, diagnostic ASA CLASS:   Class III INDICATIONS:anemia, hematochezia, negative endoscopy. MEDICATIONS: Fentanyl 75 mcg IV and Versed 6 mg IV  DESCRIPTION OF PROCEDURE:   After the risks benefits and alternatives of the procedure were thoroughly explained, informed consent was obtained.  A digital rectal exam revealed no abnormalities of the rectum.   The Pentax Ped Colon [id number] endoscope was introduced through the anus and advanced to the cecum, which was identified by both the appendix and ileocecal valve. No adverse events experienced.   The quality of the prep was good.  The instrument was then slowly withdrawn as the colon was fully examined.     Findings:  Normal digital rectal exam.  Prep quality was good. Scattered medium-sized diverticula, left more than right colon.  No polyps, masses, vascular ectasias, of inflammatory changes were seen.   No old or fresh blood was seen     Retroflexed views of rectum  revealed internal hemorrhoids.  Withdrawal time was over 6 minutes     .  The scope was withdrawn and the procedure completed.  ENDOSCOPIC IMPRESSION:     As above.  Suspect bleeding was diverticular in nature (in setting of intensive antiplatelet therapy). Hemorrhoidal bleeding seems much less likely but can't be excluded.  RECOMMENDATIONS:     1.  Watch for potential complications of procedure. 2.  Advance diet. 3.  If no post-procedural troubles, ok to discharge today from GI perspective.  OK to resume aspirin and Effient.  eSigned:  Willis Modena, MD 08/14/2012 11:31 AM   cc:

## 2012-08-21 ENCOUNTER — Encounter (HOSPITAL_COMMUNITY): Payer: Self-pay | Admitting: *Deleted

## 2012-08-21 ENCOUNTER — Observation Stay (HOSPITAL_COMMUNITY)
Admission: EM | Admit: 2012-08-21 | Discharge: 2012-08-23 | Disposition: A | Discharge status: Home / Self Care | Payer: Medicare Other | Attending: Internal Medicine | Admitting: Internal Medicine

## 2012-08-21 DIAGNOSIS — K219 Gastro-esophageal reflux disease without esophagitis: Secondary | ICD-10-CM | POA: Insufficient documentation

## 2012-08-21 DIAGNOSIS — I509 Heart failure, unspecified: Secondary | ICD-10-CM | POA: Insufficient documentation

## 2012-08-21 DIAGNOSIS — I2510 Atherosclerotic heart disease of native coronary artery without angina pectoris: Secondary | ICD-10-CM

## 2012-08-21 DIAGNOSIS — E785 Hyperlipidemia, unspecified: Secondary | ICD-10-CM

## 2012-08-21 DIAGNOSIS — G4733 Obstructive sleep apnea (adult) (pediatric): Secondary | ICD-10-CM

## 2012-08-21 DIAGNOSIS — Z8672 Personal history of thrombophlebitis: Secondary | ICD-10-CM

## 2012-08-21 DIAGNOSIS — G4730 Sleep apnea, unspecified: Secondary | ICD-10-CM | POA: Insufficient documentation

## 2012-08-21 DIAGNOSIS — Z79899 Other long term (current) drug therapy: Secondary | ICD-10-CM | POA: Insufficient documentation

## 2012-08-21 DIAGNOSIS — I739 Peripheral vascular disease, unspecified: Secondary | ICD-10-CM | POA: Insufficient documentation

## 2012-08-21 DIAGNOSIS — E119 Type 2 diabetes mellitus without complications: Secondary | ICD-10-CM | POA: Insufficient documentation

## 2012-08-21 DIAGNOSIS — J45909 Unspecified asthma, uncomplicated: Secondary | ICD-10-CM | POA: Insufficient documentation

## 2012-08-21 DIAGNOSIS — I10 Essential (primary) hypertension: Secondary | ICD-10-CM

## 2012-08-21 DIAGNOSIS — E7800 Pure hypercholesterolemia, unspecified: Secondary | ICD-10-CM | POA: Insufficient documentation

## 2012-08-21 DIAGNOSIS — R635 Abnormal weight gain: Secondary | ICD-10-CM

## 2012-08-21 DIAGNOSIS — R0609 Other forms of dyspnea: Secondary | ICD-10-CM

## 2012-08-21 DIAGNOSIS — I70219 Atherosclerosis of native arteries of extremities with intermittent claudication, unspecified extremity: Secondary | ICD-10-CM

## 2012-08-21 DIAGNOSIS — K922 Gastrointestinal hemorrhage, unspecified: Principal | ICD-10-CM

## 2012-08-21 LAB — CBC WITH DIFFERENTIAL/PLATELET
Basophils Relative: 1 % (ref 0–1)
Eosinophils Absolute: 0.1 10*3/uL (ref 0.0–0.7)
Hemoglobin: 12.4 g/dL — ABNORMAL LOW (ref 13.0–17.0)
MCH: 29 pg (ref 26.0–34.0)
MCHC: 33.8 g/dL (ref 30.0–36.0)
Monocytes Absolute: 0.5 10*3/uL (ref 0.1–1.0)
Monocytes Relative: 7 % (ref 3–12)
Neutrophils Relative %: 53 % (ref 43–77)

## 2012-08-21 LAB — BASIC METABOLIC PANEL
BUN: 14 mg/dL (ref 6–23)
GFR calc non Af Amer: 78 mL/min — ABNORMAL LOW (ref >90)
Glucose, Bld: 90 mg/dL (ref 70–99)
Potassium: 3.8 mEq/L (ref 3.5–5.1)

## 2012-08-21 LAB — PROTIME-INR: INR: 1.01 (ref 0.00–1.49)

## 2012-08-21 LAB — PREPARE RBC (CROSSMATCH)

## 2012-08-21 LAB — HEMOGLOBIN AND HEMATOCRIT, BLOOD: Hemoglobin: 12.1 g/dL — ABNORMAL LOW (ref 13.0–17.0)

## 2012-08-21 MED ORDER — SODIUM CHLORIDE 0.9 % IV SOLN
INTRAVENOUS | Status: DC
  Administered 2012-08-21: 18:00:00 via INTRAVENOUS

## 2012-08-21 MED ORDER — SODIUM CHLORIDE 0.9 % IJ SOLN
3.0000 mL | Freq: Two times a day (BID) | INTRAMUSCULAR | Status: DC
  Administered 2012-08-22: 3 mL via INTRAVENOUS

## 2012-08-21 MED ORDER — LOPERAMIDE HCL 2 MG PO CAPS
2.0000 mg | ORAL_CAPSULE | Freq: Four times a day (QID) | ORAL | Status: DC | PRN
  Filled 2012-08-21: qty 1

## 2012-08-21 MED ORDER — MORPHINE SULFATE 2 MG/ML IJ SOLN: 2.0000 mg | INTRAMUSCULAR | Status: DC | PRN

## 2012-08-21 MED ORDER — ACETAMINOPHEN 325 MG PO TABS: 650.0000 mg | ORAL_TABLET | Freq: Four times a day (QID) | ORAL | Status: DC | PRN

## 2012-08-21 MED ORDER — SODIUM CHLORIDE 0.9 % IV SOLN
INTRAVENOUS | Status: DC
  Administered 2012-08-22: 75 mL/h via INTRAVENOUS
  Administered 2012-08-23: 01:00:00 via INTRAVENOUS

## 2012-08-21 MED ORDER — ALBUTEROL SULFATE HFA 108 (90 BASE) MCG/ACT IN AERS
2.0000 | INHALATION_SPRAY | Freq: Four times a day (QID) | RESPIRATORY_TRACT | Status: DC | PRN
  Filled 2012-08-21: qty 6.7

## 2012-08-21 MED ORDER — BUDESONIDE-FORMOTEROL FUMARATE 80-4.5 MCG/ACT IN AERO
2.0000 | INHALATION_SPRAY | Freq: Two times a day (BID) | RESPIRATORY_TRACT | Status: DC
  Administered 2012-08-21 – 2012-08-23 (×4): 2 via RESPIRATORY_TRACT
  Filled 2012-08-21: qty 6.9

## 2012-08-21 MED ORDER — METOPROLOL TARTRATE 25 MG PO TABS
25.0000 mg | ORAL_TABLET | Freq: Every day | ORAL | Status: DC
  Administered 2012-08-21 – 2012-08-23 (×3): 25 mg via ORAL
  Filled 2012-08-21 (×3): qty 1

## 2012-08-21 MED ORDER — HYDROCODONE-ACETAMINOPHEN 5-325 MG PO TABS: 1.0000 | ORAL_TABLET | ORAL | Status: DC | PRN

## 2012-08-21 MED ORDER — DIAZEPAM 5 MG PO TABS: 5.0000 mg | ORAL_TABLET | Freq: Every day | ORAL | Status: DC | PRN

## 2012-08-21 MED ORDER — NITROGLYCERIN 0.4 MG SL SUBL: 0.4000 mg | SUBLINGUAL_TABLET | SUBLINGUAL | Status: DC | PRN

## 2012-08-21 MED ORDER — METFORMIN HCL 500 MG PO TABS
500.0000 mg | ORAL_TABLET | Freq: Two times a day (BID) | ORAL | Status: DC
  Administered 2012-08-22 – 2012-08-23 (×3): 500 mg via ORAL
  Filled 2012-08-21 (×6): qty 1

## 2012-08-21 MED ORDER — EZETIMIBE-SIMVASTATIN 10-80 MG PO TABS
1.0000 | ORAL_TABLET | Freq: Every day | ORAL | Status: DC
  Administered 2012-08-21 – 2012-08-22 (×2): 1 via ORAL
  Filled 2012-08-21 (×3): qty 1

## 2012-08-21 MED ORDER — ACETAMINOPHEN 650 MG RE SUPP: 650.0000 mg | Freq: Four times a day (QID) | RECTAL | Status: DC | PRN

## 2012-08-21 MED ORDER — LEVOTHYROXINE SODIUM 175 MCG PO TABS
175.0000 ug | ORAL_TABLET | Freq: Every day | ORAL | Status: DC
  Administered 2012-08-22 – 2012-08-23 (×2): 175 ug via ORAL
  Filled 2012-08-21 (×4): qty 1

## 2012-08-21 MED ORDER — ISOSORBIDE MONONITRATE ER 60 MG PO TB24
60.0000 mg | ORAL_TABLET | Freq: Every day | ORAL | Status: DC
  Administered 2012-08-21 – 2012-08-22 (×2): 60 mg via ORAL
  Filled 2012-08-21 (×3): qty 1

## 2012-08-21 MED ORDER — FUROSEMIDE 40 MG PO TABS
40.0000 mg | ORAL_TABLET | Freq: Every day | ORAL | Status: DC
  Administered 2012-08-21 – 2012-08-23 (×3): 40 mg via ORAL
  Filled 2012-08-21 (×3): qty 1

## 2012-08-21 MED ORDER — ONDANSETRON HCL 4 MG PO TABS: 4.0000 mg | ORAL_TABLET | Freq: Four times a day (QID) | ORAL | Status: DC | PRN

## 2012-08-21 MED ORDER — PANTOPRAZOLE SODIUM 40 MG IV SOLR
40.0000 mg | Freq: Two times a day (BID) | INTRAVENOUS | Status: DC
  Administered 2012-08-21 – 2012-08-23 (×4): 40 mg via INTRAVENOUS
  Filled 2012-08-21 (×5): qty 40

## 2012-08-21 MED ORDER — INSULIN ASPART 100 UNIT/ML ~~LOC~~ SOLN
0.0000 [IU] | Freq: Three times a day (TID) | SUBCUTANEOUS | Status: DC
  Administered 2012-08-22: 1 [IU] via SUBCUTANEOUS

## 2012-08-21 NOTE — ED Notes (Signed)
Attempted to call report to Annabelle Harman at [phone], RN unavailable at this time

## 2012-08-21 NOTE — ED Provider Notes (Addendum)
History     CSN: [id number]  Arrival date & time 08/21/12  1526   First MD Initiated Contact with Patient 08/21/12 1652      Chief Complaint  Patient presents with  . GI Bleeding    (Consider location/radiation/quality/duration/timing/severity/associated sxs/prior treatment) The history is provided by the patient, a relative and medical records.   78 year old, male, presents to emergency department complaining of blood in his stool.  Last night.  He has not had a bowel movement since then.  He denies pain anywhere.  He denies being lightheaded or shortness of breath.  He denies fever, bruising, rashes, or recent antibiotic use.  He denies alcohol use.  He has not had peptic ulcer disease in the past.  He was admitted on October 31 with similar symptoms.  Dr. Dulce Sellar, did a colonoscopy on him.  On November 1.  Diverticular disease.  Was identified, but no active bleeding.  The patient is on both aspirin and effient.  He has no other anticoagulants.  He is asymptomatic now.  His daughter called the gastroenterologist, and was told to bring him to the emergency department for evaluation and admission.  Past Medical History  Diagnosis Date  . Diabetes mellitus   . Asthma   . Coronary artery disease   . Hypertension   . Hypercholesterolemia   . Hypothyroidism   . Anxiety   . COPD (chronic obstructive pulmonary disease)   . GERD (gastroesophageal reflux disease)   . CHF (congestive heart failure)     diastolic   . Sleep apnea     severe OSA awaiting CPAP titration  . Peripheral vascular disease 11/2006    s/p left stent  . Shortness of breath     Past Surgical History  Procedure Date  . Coronary stent placement 2010 and 2012  . Hernia repair 1992    evntral hernia repair  . Esophagogastroduodenoscopy 12/26/2011    Procedure: ESOPHAGOGASTRODUODENOSCOPY (EGD);  Surgeon: Charolett Bumpers, MD;  Location: [facility];  Service: Endoscopy;  Laterality: N/A;  . Balloon dilation  12/26/2011    Procedure: BALLOON DILATION;  Surgeon: Charolett Bumpers, MD;  Location: [facility];  Service: Endoscopy;  Laterality: N/A;  . Esophagogastroduodenoscopy 08/13/2012    Procedure: ESOPHAGOGASTRODUODENOSCOPY (EGD);  Surgeon: Willis Modena, MD;  Location: [facility];  Service: Endoscopy;  Laterality: Left;  . Colonoscopy 08/14/2012    Procedure: COLONOSCOPY;  Surgeon: Willis Modena, MD;  Location: [facility];  Service: Endoscopy;  Laterality: N/A;    Family History  Problem Relation Age of Onset  . Malignant hyperthermia Neg Hx   . Diabetes Mother   . Hyperlipidemia Mother   . Hypertension Mother   . Heart attack Mother   . Cancer Sister   . Diabetes Sister   . Hyperlipidemia Sister   . Hypertension Sister   . Heart attack Sister   . Hyperlipidemia Sister   . Hypertension Sister     History  Substance Use Topics  . Smoking status: Former Smoker -- 15 years    Types: Cigarettes, Cigars    Quit date: 10/14/1958  . Smokeless tobacco: Never Used  . Alcohol Use: No      Review of Systems  Constitutional: Negative for fever and chills.  HENT: Negative for nosebleeds.   Eyes: Negative for redness.  Respiratory: Negative for shortness of breath.   Cardiovascular: Negative for chest pain and palpitations.  Gastrointestinal: Positive for blood in stool. Negative for nausea, vomiting, abdominal pain and diarrhea.  Genitourinary: Negative  for hematuria.  Musculoskeletal: Negative for back pain.  Skin: Negative for color change.  Neurological: Negative for light-headedness.  Hematological: Does not bruise/bleed easily.  Psychiatric/Behavioral: Negative for confusion.  All other systems reviewed and are negative.    Allergies  Clopidogrel bisulfate  Home Medications   Current Outpatient Rx  Name  Route  Sig  Dispense  Refill  . ALBUTEROL SULFATE HFA 108 (90 BASE) MCG/ACT IN AERS   Inhalation   Inhale 2 puffs into the lungs every 6 (six) hours as  needed.         . ASPIRIN EC 325 MG PO TBEC   Oral   Take 325 mg by mouth daily.          . BUDESONIDE-FORMOTEROL FUMARATE 80-4.5 MCG/ACT IN AERO   Inhalation   Inhale 2 puffs into the lungs daily as needed. FOR WHEEZING         . DIAZEPAM 5 MG PO TABS   Oral   Take 1 tablet (5 mg total) by mouth daily as needed. He takes only when having procedures done for anxiety.   30 tablet   1   . EZETIMIBE-SIMVASTATIN 10-80 MG PO TABS   Oral   Take 1 tablet by mouth at bedtime.           . FUROSEMIDE 40 MG PO TABS   Oral   Take 40 mg by mouth daily as needed. For swelling or fluid retention.          Marland Kitchen GLIMEPIRIDE 2 MG PO TABS   Oral   Take 2 mg by mouth daily.           . ISOSORBIDE MONONITRATE ER 60 MG PO TB24   Oral   Take 60 mg by mouth at bedtime.          Marland Kitchen LEVOTHYROXINE SODIUM 175 MCG PO TABS   Oral   Take 175 mcg by mouth daily before breakfast.          . LOPERAMIDE HCL 2 MG PO TABS   Oral   Take 2 mg by mouth 4 (four) times daily as needed. For diarrhea.          . METFORMIN HCL 500 MG PO TABS   Oral   Take 500 mg by mouth 2 (two) times daily.          Marland Kitchen METOPROLOL TARTRATE 25 MG PO TABS   Oral   Take 25 mg by mouth daily.          Marland Kitchen PRASUGREL HCL 10 MG PO TABS   Oral   Take 10 mg by mouth at bedtime.          Marland Kitchen NITROGLYCERIN 0.4 MG SL SUBL   Sublingual   Place 0.4 mg under the tongue every 5 (five) minutes x 3 doses as needed. For chest pain.         Marland Kitchen ONDANSETRON HCL 4 MG PO TABS   Oral   Take 1 tablet (4 mg total) by mouth every 6 (six) hours as needed for nausea.   45 tablet   1     BP 146/76  Pulse 90  Temp 98.1 F (36.7 C) (Oral)  Resp 16  SpO2 95%  Physical Exam  Nursing note and vitals reviewed. Constitutional: He is oriented to person, place, and time. He appears well-developed and well-nourished. No distress.  HENT:  Head: Atraumatic.  Eyes: Conjunctivae normal and EOM are normal.  Neck: Normal range of  motion. Neck supple.  Cardiovascular: Normal rate and intact distal pulses.   No murmur heard. Pulmonary/Chest: Effort normal and breath sounds normal. No respiratory distress.  Abdominal: Soft. Bowel sounds are normal. He exhibits no distension. There is no tenderness.  Genitourinary: Guaiac positive stool.  Musculoskeletal: Normal range of motion.  Neurological: He is alert and oriented to person, place, and time. No cranial nerve deficit.  Skin: Skin is warm and dry. No pallor.  Psychiatric: He has a normal mood and affect. Thought content normal.    ED Course  Procedures (including critical care time) GI bleed in 78 year old, male, with known diverticular disease.  Heme positive.  Today.  No hypotension or tachycardia.  The shortness of breath, or lightheadedness.  We will perform laboratory testing, and then consult the hospitalist, for reevaluation.  Labs Reviewed  BASIC METABOLIC PANEL  CBC WITH DIFFERENTIAL   No results found.   No diagnosis found.  6:53 PM Spoke with hospitalist.  He will see for admission.  He asked me to call [facility] as well.  7:31 PM Spoke with Dr. Randa Evens.  He will place, the patient, on the list for his service to evaluate in the hospital   MDM  Painless.  GI bleed, with known diverticular disease        Cheri Guppy, MD 08/21/12 1853  Cheri Guppy, MD 08/21/12 1932

## 2012-08-21 NOTE — ED Notes (Signed)
Pt seen in ED 10/30. Admitted to hospital, d/c from hospital on Friday. Pt reports blood stool last night. Pt daugthers called dr Laural Benes and md told daughter to bring pt to ED. Pt denies pain. Pt has not had bowel movement. Today.

## 2012-08-21 NOTE — H&P (Addendum)
[facility]                                                                                    Patient Demographics  [name], is a 78 y.o. male  CSN: [id number]  MRN: [id number]  DOB - 11/01/1929  Admit Date - 08/21/2012  Outpatient Primary MD for the patient is Lupita Raider, MD   With History of -  Past Medical History  Diagnosis Date  . Diabetes mellitus   . Asthma   . Coronary artery disease   . Hypertension   . Hypercholesterolemia   . Hypothyroidism   . Anxiety   . COPD (chronic obstructive pulmonary disease)   . GERD (gastroesophageal reflux disease)   . CHF (congestive heart failure)     diastolic   . Sleep apnea     severe OSA awaiting CPAP titration  . Peripheral vascular disease 11/2006    s/p left stent  . Shortness of breath       Past Surgical History  Procedure Date  . Coronary stent placement 2010 and 2012  . Hernia repair 1992    evntral hernia repair  . Esophagogastroduodenoscopy 12/26/2011    Procedure: ESOPHAGOGASTRODUODENOSCOPY (EGD);  Surgeon: Charolett Bumpers, MD;  Location: [facility];  Service: Endoscopy;  Laterality: N/A;  . Balloon dilation 12/26/2011    Procedure: BALLOON DILATION;  Surgeon: Charolett Bumpers, MD;  Location: [facility];  Service: Endoscopy;  Laterality: N/A;  . Esophagogastroduodenoscopy 08/13/2012    Procedure: ESOPHAGOGASTRODUODENOSCOPY (EGD);  Surgeon: Willis Modena, MD;  Location: [facility];  Service: Endoscopy;  Laterality: Left;  . Colonoscopy 08/14/2012    Procedure: COLONOSCOPY;  Surgeon: Willis Modena, MD;  Location: [facility];  Service: Endoscopy;  Laterality: N/A;    in for   Chief Complaint  Patient presents with  . GI Bleeding     HPI  [name]  is a 78 y.o. male, with a past medical history significant for coronary artery disease, diabetes, asthma and was recently discharged from our hospital after he was admitted with GI bleed; his workup at that time included an  EGD which was negative and a colonoscopy which showed diverticulosis but no active bleeding, presenting again with the same problem. Yesterday at night he had frank blood per rectum. The patient is on aspirin and effient . Patient called to Dr. Laural Benes who told him to be admitted and we will consult on him tomorrow   Review of Systems    In addition to the HPI above,  No Fever-chills, No Headache, No changes with Vision or hearing, No problems swallowing food or Liquids, No Chest pain, Cough or Shortness of Breath, No Abdominal pain, No Nausea or Vommitting, Bowel movements are regular, No Blood in stool or Urine, No dysuria, No new skin rashes or bruises, No new joints pains-aches,  No new weakness, tingling, numbness in any extremity, No recent weight gain or loss, No polyuria, polydypsia or polyphagia, No significant Mental Stressors.  A full 10 point Review of Systems was done, except as stated above, all other Review of Systems were negative.   Social History History  Substance Use Topics  . Smoking  status: Former Smoker -- 15 years    Types: Cigarettes, Cigars    Quit date: 10/14/1958  . Smokeless tobacco: Never Used  . Alcohol Use: No     Family History Family History  Problem Relation Age of Onset  . Malignant hyperthermia Neg Hx   . Diabetes Mother   . Hyperlipidemia Mother   . Hypertension Mother   . Heart attack Mother   . Cancer Sister   . Diabetes Sister   . Hyperlipidemia Sister   . Hypertension Sister   . Heart attack Sister   . Hyperlipidemia Sister   . Hypertension Sister      Prior to Admission medications   Medication Sig Start Date End Date Taking? Authorizing Provider  albuterol (PROVENTIL HFA;VENTOLIN HFA) 108 (90 BASE) MCG/ACT inhaler Inhale 2 puffs into the lungs every 6 (six) hours as needed.   Yes Historical Provider, MD  aspirin EC 325 MG tablet Take 325 mg by mouth daily.    Yes Historical Provider, MD  budesonide-formoterol  (SYMBICORT) 80-4.5 MCG/ACT inhaler Inhale 2 puffs into the lungs daily as needed. FOR WHEEZING   Yes Historical Provider, MD  diazepam (VALIUM) 5 MG tablet Take 1 tablet (5 mg total) by mouth daily as needed. He takes only when having procedures done for anxiety. 08/14/12  Yes Dorothea Ogle, MD  ezetimibe-simvastatin (VYTORIN) 10-80 MG per tablet Take 1 tablet by mouth at bedtime.     Yes Historical Provider, MD  furosemide (LASIX) 40 MG tablet Take 40 mg by mouth daily as needed. For swelling or fluid retention.    Yes Historical Provider, MD  glimepiride (AMARYL) 2 MG tablet Take 2 mg by mouth daily.     Yes Historical Provider, MD  isosorbide mononitrate (IMDUR) 60 MG 24 hr tablet Take 60 mg by mouth at bedtime.    Yes Historical Provider, MD  levothyroxine (SYNTHROID, LEVOTHROID) 175 MCG tablet Take 175 mcg by mouth daily before breakfast.    Yes Historical Provider, MD  loperamide (IMODIUM A-D) 2 MG tablet Take 2 mg by mouth 4 (four) times daily as needed. For diarrhea.    Yes Historical Provider, MD  metFORMIN (GLUCOPHAGE) 500 MG tablet Take 500 mg by mouth 2 (two) times daily.    Yes Historical Provider, MD  metoprolol tartrate (LOPRESSOR) 25 MG tablet Take 25 mg by mouth daily.    Yes Historical Provider, MD  prasugrel (EFFIENT) 10 MG TABS Take 10 mg by mouth at bedtime.    Yes Historical Provider, MD  nitroGLYCERIN (NITROSTAT) 0.4 MG SL tablet Place 0.4 mg under the tongue every 5 (five) minutes x 3 doses as needed. For chest pain.    Historical Provider, MD  ondansetron (ZOFRAN) 4 MG tablet Take 1 tablet (4 mg total) by mouth every 6 (six) hours as needed for nausea. 08/14/12   Dorothea Ogle, MD    Allergies  Allergen Reactions  . Clopidogrel Bisulfate Itching    Physical Exam  Vitals  Blood pressure 156/78, pulse 76, temperature 98.7 F (37.1 C), temperature source Oral, resp. rate 17, SpO2 96.00%.   1. General African American male very pleasant in no acute distress lying in  bed  2. Normal affect and insight, Not Suicidal or Homicidal, Awake Alert, Oriented X 3.  3. No F.N deficits, ALL C.Nerves Intact, Strength 5/5 all 4 extremities, Sensation intact all 4 extremities, Plantars down going.  4. Ears and Eyes appear Normal, Conjunctivae clear, PERRLA. Moist Oral Mucosa.  5. Supple Neck,  No JVD, No cervical lymphadenopathy appriciated, No Carotid Bruits.  6. Symmetrical Chest wall movement, Good air movement bilaterally, CTAB.  7. RRR, No Gallops, Rubs or Murmurs, No Parasternal Heave.  8. Positive Bowel Sounds, Abdomen Soft, Non tender, No organomegaly appriciated,No rebound -guarding or rigidity.  9.  No Cyanosis, Normal Skin Turgor, No Skin Rash or Bruise.  10. Good muscle tone,  joints appear normal , no effusions, Normal ROM.  11. No Palpable Lymph Nodes in Neck or Axillae    Data Review  CBC  Lab 08/21/12 1735  WBC 7.2  HGB 12.4*  HCT 36.7*  PLT 182  MCV 85.7  MCH 29.0  MCHC 33.8  RDW 13.7  LYMPHSABS 2.7  MONOABS 0.5  EOSABS 0.1  BASOSABS 0.0  BANDABS --   ------------------------------------------------------------------------------------------------------------------  Chemistries   Lab 08/21/12 1735  NA 134*  K 3.8  CL 100  CO2 26  GLUCOSE 90  BUN 14  CREATININE 0.90  CALCIUM 9.4  MG --  AST --  ALT --  ALKPHOS --  BILITOT --   ------------------------------------------------------------------------------------------------------------------   ---------------------------------------------------------------------------------------------------------------  Urinalysis    Component Value Date/Time   COLORURINE YELLOW 01/05/2011 1535   APPEARANCEUR CLEAR 01/05/2011 1535   LABSPEC 1.018 01/05/2011 1535   PHURINE 6.5 01/05/2011 1535   GLUCOSEU NEGATIVE 01/05/2011 1535   HGBUR NEGATIVE 01/05/2011 1535   BILIRUBINUR NEGATIVE 01/05/2011 1535   KETONESUR NEGATIVE 01/05/2011 1535   PROTEINUR NEGATIVE 01/05/2011 1535    UROBILINOGEN 1.0 01/05/2011 1535   NITRITE NEGATIVE 01/05/2011 1535   LEUKOCYTESUR NEGATIVE MICROSCOPIC NOT DONE ON URINES WITH NEGATIVE PROTEIN, BLOOD, LEUKOCYTES, NITRITE, OR GLUCOSE <1000 mg/dL. 01/05/2011 1535    ----------------------------------------------------------------------------------------------------------------       Imaging results:   Dg Chest Portable 1 View  08/12/2012  *RADIOLOGY REPORT*  Clinical Data: Shortness of breath.  Rectal bleeding.  PORTABLE CHEST - 1 VIEW  Comparison: 08/23/2009.  Findings: Granuloma left mid lung unchanged.  Heart size top normal.  Pulmonary vascular prominence most notable centrally without pulmonary edema.  No segmental consolidation or gross pneumothorax.  IMPRESSION: No acute abnormality.  Please see above.   Original Report Authenticated By: Fuller Canada, M.D.     Assessment & Plan     1. GI bleed/lower; patient was recently discharged from our hospital after a negative EGD and a colonoscopy that showed diverticulosis. His hemoglobin is stable however he has multiple risk factors . 2. History of coronary artery disease, peripheral vascular disease, diabetes, hypercholesterolemia Plan Will place patient on telemetry because of his risk factors  N.p.o. IV fluids normal saline IV Protonix GI already on consult.  DVT Prophylaxis  SCDs  AM Labs Ordered, also please review Full Orders  Family Communication: Admission, patients condition and plan of care including tests being ordered have been discussed with the patient and daughters who indicate understanding and agree with the plan and Code Status.  Code Status full Disposition Plan: Home  Time spent in minutes : 40  Condition GUARDED   aspirin and FE and put on hold and blood transfusion when necessary.

## 2012-08-22 ENCOUNTER — Encounter (HOSPITAL_COMMUNITY): Payer: Self-pay

## 2012-08-22 DIAGNOSIS — I10 Essential (primary) hypertension: Secondary | ICD-10-CM

## 2012-08-22 DIAGNOSIS — I2510 Atherosclerotic heart disease of native coronary artery without angina pectoris: Secondary | ICD-10-CM

## 2012-08-22 DIAGNOSIS — G4733 Obstructive sleep apnea (adult) (pediatric): Secondary | ICD-10-CM

## 2012-08-22 DIAGNOSIS — I739 Peripheral vascular disease, unspecified: Secondary | ICD-10-CM

## 2012-08-22 DIAGNOSIS — J45909 Unspecified asthma, uncomplicated: Secondary | ICD-10-CM

## 2012-08-22 DIAGNOSIS — I70219 Atherosclerosis of native arteries of extremities with intermittent claudication, unspecified extremity: Secondary | ICD-10-CM

## 2012-08-22 DIAGNOSIS — K922 Gastrointestinal hemorrhage, unspecified: Secondary | ICD-10-CM

## 2012-08-22 LAB — GLUCOSE, CAPILLARY: Glucose-Capillary: 126 mg/dL — ABNORMAL HIGH (ref 70–99)

## 2012-08-22 LAB — HEMOGLOBIN AND HEMATOCRIT, BLOOD
HCT: 36.6 % — ABNORMAL LOW (ref 39.0–52.0)
Hemoglobin: 12.1 g/dL — ABNORMAL LOW (ref 13.0–17.0)

## 2012-08-22 MED ORDER — POLYETHYLENE GLYCOL 3350 17 G PO PACK
17.0000 g | PACK | Freq: Three times a day (TID) | ORAL | Status: DC
  Administered 2012-08-22 – 2012-08-23 (×2): 17 g via ORAL
  Filled 2012-08-22 (×6): qty 1

## 2012-08-22 NOTE — Progress Notes (Signed)
[facility]             Progress Note   Subjective: No complaints. No further rectal bleeding.  Objective: Vital signs in last 24 hours: Temp:  [97.5 F (36.4 C)-98.7 F (37.1 C)] 97.5 F (36.4 C) (11/09 1026) Pulse Rate:  [75-96] 76  (11/09 1026) Resp:  [16-20] 20  (11/09 1026) BP: (135-163)/(53-93) 163/81 mmHg (11/09 1026) SpO2:  [93 %-96 %] 93 % (11/09 1026) Weight:  [86.3 kg (190 lb 4.1 oz)] 86.3 kg (190 lb 4.1 oz) (11/08 2100) Weight change:  Last BM Date: 08/21/12  Intake/Output from previous day: 11/08 0701 - 11/09 0700 In: -  Out: 700 [Urine:700]     Physical Exam: General: Alert, awake, oriented x3, in no acute distress. HEENT: No bruits, no goiter. Heart: Regular rate and rhythm, without murmurs, rubs, gallops. Lungs: Clear to auscultation bilaterally. Abdomen: Soft, nontender, nondistended, positive bowel sounds. Extremities: No clubbing cyanosis or edema with positive pedal pulses. Neuro: Grossly intact, nonfocal.    Lab Results: Basic Metabolic Panel:  Basename 08/21/12 1735  NA 134*  K 3.8  CL 100  CO2 26  GLUCOSE 90  BUN 14  CREATININE 0.90  CALCIUM 9.4  MG --  PHOS --   CBC:  Basename 08/22/12 0858 08/22/12 0300 08/21/12 1735  WBC -- -- 7.2  NEUTROABS -- -- 3.8  HGB 12.1* 11.7* --  HCT 36.6* 34.9* --  MCV -- -- 85.7  PLT -- -- 182   CBG:  Basename 08/22/12 0739  GLUCAP 117*   Coagulation:  Basename 08/21/12 2250  LABPROT 13.2  INR 1.01    Studies/Results: No results found.  Medications: Scheduled Meds:   . budesonide-formoterol  2 puff Inhalation BID  . ezetimibe-simvastatin  1 tablet Oral QHS  . furosemide  40 mg Oral Daily  . insulin aspart  0-9 Units Subcutaneous TID WC  . isosorbide mononitrate  60 mg Oral QHS  . levothyroxine  175 mcg Oral QAC breakfast  . metFORMIN  500 mg Oral BID WC  . metoprolol tartrate  25 mg Oral Daily  . pantoprazole (PROTONIX) IV  40 mg Intravenous Q12H  .  polyethylene glycol  17 g Oral TID  . sodium chloride  3 mL Intravenous Q12H   Continuous Infusions:   . sodium chloride 75 mL/hr at 08/21/12 2303  . [DISCONTINUED] sodium chloride 125 mL/hr at 08/21/12 1749   PRN Meds:.acetaminophen, acetaminophen, albuterol, diazepam, HYDROcodone-acetaminophen, loperamide, morphine injection, nitroGLYCERIN, ondansetron  Assessment/Plan:  Active Problems:  ASTHMA  PVD (peripheral vascular disease)  GI bleed  CAD (coronary artery disease)   GI Bleed -Recent EGD and colonoscopy that were normal except for diverticulosis. -Bleeding is presumed diverticular in nature. -May need to hold antiplatelet agents for a longer time. -Patient's cardiologist is Dr. Armanda Magic. -Will ask for [facility] consultation in regards to getting their opinion in Dc'ing the Effient long term. -Hb has been stable. -No indication for transfusion at this point.  Disposition -Plan on DC home in 24 hours if bleeding does not recur.   Time spent coordinating care: 35 minutes.   LOS: 1 day   [facility] Pager: [phone] 08/22/2012, 11:40 AM

## 2012-08-22 NOTE — Consult Note (Signed)
CARDIOLOGY CONSULT NOTE    Patient ID: [name] MRN: [id number] DOB/AGE: 12-27-1929 78 y.o.  Admit date: 08/21/2012 Referring Physician:  Sharyon Medicus  Primary Physician: Lupita Raider, MD Primary Cardiologist:  Suzzette Righter Turner/ Jim Like Reason for Consultation:  CAD GI bleed  Active Problems:  ASTHMA  PVD (peripheral vascular disease)  GI bleed  CAD (coronary artery disease)   HPI:   78 yo patient readmitted with LGI bleed  Just D/C 11/1  Appears to have diverticular bleed.  Asked by primary service feasibility of stopping Effient.  He has CAD.  Had DES To RCA 3.0 mm 04/11/09.  Had DES to IM 2.22mm 01/24/11.  Also had PVD with left iliac stent by Dr Edilia Bo 04/20/12.  He has not had active angina.  He has had recurrent BRPPR.   Upper endoscopy was negative.  PLT;s are normal and no other known bleeding diathesis.  Hct currently stable at 36.6  @ROS @ All other systems reviewed and negative except as noted above  Past Medical History  Diagnosis Date  . Diabetes mellitus   . Asthma   . Coronary artery disease   . Hypertension   . Hypercholesterolemia   . Hypothyroidism   . Anxiety   . COPD (chronic obstructive pulmonary disease)   . GERD (gastroesophageal reflux disease)   . CHF (congestive heart failure)     diastolic   . Sleep apnea     severe OSA awaiting CPAP titration  . Peripheral vascular disease 11/2006    s/p left stent  . Shortness of breath     Family History  Problem Relation Age of Onset  . Malignant hyperthermia Neg Hx   . Diabetes Mother   . Hyperlipidemia Mother   . Hypertension Mother   . Heart attack Mother   . Cancer Sister   . Diabetes Sister   . Hyperlipidemia Sister   . Hypertension Sister   . Heart attack Sister   . Hyperlipidemia Sister   . Hypertension Sister     History   Social History  . Marital Status: Widowed    Spouse Name: N/A    Number of Children: N/A  . Years of Education: N/A   Occupational History  . Not on file.    Social History Main Topics  . Smoking status: Former Smoker -- 15 years    Types: Cigarettes, Cigars    Quit date: 10/14/1958  . Smokeless tobacco: Never Used  . Alcohol Use: No  . Drug Use: No  . Sexually Active: No   Other Topics Concern  . Not on file   Social History Narrative  . No narrative on file    Past Surgical History  Procedure Date  . Coronary stent placement 2010 and 2012  . Hernia repair 1992    evntral hernia repair  . Esophagogastroduodenoscopy 12/26/2011    Procedure: ESOPHAGOGASTRODUODENOSCOPY (EGD);  Surgeon: Charolett Bumpers, MD;  Location: [facility];  Service: Endoscopy;  Laterality: N/A;  . Balloon dilation 12/26/2011    Procedure: BALLOON DILATION;  Surgeon: Charolett Bumpers, MD;  Location: [facility];  Service: Endoscopy;  Laterality: N/A;  . Esophagogastroduodenoscopy 08/13/2012    Procedure: ESOPHAGOGASTRODUODENOSCOPY (EGD);  Surgeon: Willis Modena, MD;  Location: [facility];  Service: Endoscopy;  Laterality: Left;  . Colonoscopy 08/14/2012    Procedure: COLONOSCOPY;  Surgeon: Willis Modena, MD;  Location: [facility];  Service: Endoscopy;  Laterality: N/A;        . budesonide-formoterol  2 puff Inhalation BID  . ezetimibe-simvastatin  1 tablet Oral QHS  . furosemide  40 mg Oral Daily  . insulin aspart  0-9 Units Subcutaneous TID WC  . isosorbide mononitrate  60 mg Oral QHS  . levothyroxine  175 mcg Oral QAC breakfast  . metFORMIN  500 mg Oral BID WC  . metoprolol tartrate  25 mg Oral Daily  . pantoprazole (PROTONIX) IV  40 mg Intravenous Q12H  . polyethylene glycol  17 g Oral TID  . sodium chloride  3 mL Intravenous Q12H      . sodium chloride 75 mL/hr (08/22/12 1147)  . [DISCONTINUED] sodium chloride 125 mL/hr at 08/21/12 1749    Physical Exam: Blood pressure 163/81, pulse 76, temperature 97.5 F (36.4 C), temperature source Oral, resp. rate 20, height 5\' 4"  (1.626 m), weight 190 lb 4.1 oz (86.3 kg), SpO2 93.00%.   Affect  appropriate Healthy:  appears stated age HEENT: normal Neck supple with no adenopathy JVP normal no bruits no thyromegaly Lungs clear with no wheezing and good diaphragmatic motion Heart:  S1/S2 no murmur, no rub, gallop or click PMI normal Abdomen: benighn, BS positve, no tenderness, no AAA no bruit.  No HSM or HJR Distal pulses intact with no bruits No edema Neuro non-focal Skin warm and dry No muscular weakness   Labs:   Lab Results  Component Value Date   WBC 7.2 08/21/2012   HGB 12.1* 08/22/2012   HCT 36.6* 08/22/2012   MCV 85.7 08/21/2012   PLT 182 08/21/2012    Lab 08/21/12 1735  NA 134*  K 3.8  CL 100  CO2 26  BUN 14  CREATININE 0.90  CALCIUM 9.4  PROT --  BILITOT --  ALKPHOS --  ALT --  AST --  GLUCOSE 90   Lab Results  Component Value Date   CKTOTAL 417* 01/05/2011   CKMB 6.2 CRITICAL RESULT CALLED TO, READ BACK BY AND VERIFIED WITH: DR. Bernette Mayers 01/05/11 1656 BY JONESJ* 01/05/2011   TROPONINI  Value: 0.03        NO INDICATION OF MYOCARDIAL INJURY. 01/05/2011    Lab Results  Component Value Date   CHOL  Value: 139        ATP III CLASSIFICATION:  <200     mg/dL   Desirable  161-096  mg/dL   Borderline High  >=045    mg/dL   High        01/20/8118   Lab Results  Component Value Date   HDL 33* 04/07/2009   Lab Results  Component Value Date   LDLCALC  Value: 84        Total Cholesterol/HDL:CHD Risk Coronary Heart Disease Risk Table                     Men   Women  1/2 Average Risk   3.4   3.3  Average Risk       5.0   4.4  2 X Average Risk   9.6   7.1  3 X Average Risk  23.4   11.0        Use the calculated Patient Ratio above and the CHD Risk Table to determine the patient's CHD Risk.        ATP III CLASSIFICATION (LDL):  <100     mg/dL   Optimal  147-829  mg/dL   Near or Above                    Optimal  130-159  mg/dL   Borderline  161-096  mg/dL   High  >045     mg/dL   Very High 01/20/8118   Lab Results  Component Value Date   TRIG 108 04/07/2009   Lab  Results  Component Value Date   CHOLHDL 4.2 04/07/2009   No results found for this basename: LDLDIRECT      Radiology: Dg Chest Portable 1 View  08/12/2012  *RADIOLOGY REPORT*  Clinical Data: Shortness of breath.  Rectal bleeding.  PORTABLE CHEST - 1 VIEW  Comparison: 08/23/2009.  Findings: Granuloma left mid lung unchanged.  Heart size top normal.  Pulmonary vascular prominence most notable centrally without pulmonary edema.  No segmental consolidation or gross pneumothorax.  IMPRESSION: No acute abnormality.  Please see above.   Original Report Authenticated By: Fuller Canada, M.D.     EKG:  04/20/12  NSR rate 73 normal   ASSESSMENT AND PLAN:  CAD:  STable no angina.  Cardiac stents placed 2010 and 2012  Ok to stop Effient and continue ASA 81mg .  Iliac stent most recent but large caliper and should be ok without dual antiplatelet Rx Can f/u with [facility] regarding duration of stopping Effient but in my mind could be indefinately GI:  Recurrent diverticular bleed clearly more active problem at this time so stopping Effient in order.  Continue PPI and guaic stools Chol:  Continue vytorin target LDL under 70 with PVD and CAD DM:  Discussed low carb diet.  Target hemoglobin A1c is 6.5 or less.  Continue current medications.   SignedCharlton Haws 08/22/2012, 3:31 PM

## 2012-08-22 NOTE — Consult Note (Signed)
[facility] CONSULT Reason for consult: Rectal bleeding Referring Physician: [facility]  PCP: Dr. Myrene Buddy is an 78 y.o. male.  HPI: 78 year old gentleman in with GI bleeding a week ago. He has been on Effient for cardiac issues. He has had previous heart attacks this in. He has had recent outpatient EGD by Dr. Laural Benes that was negative and underwent another EGD and colonoscopy last week while in the hospital I. Dr. Dulce Sellar. Both the EGD and colonoscopy were negative other than diverticulosis. The presumption was that the patient had a diverticular bleed. He was discharged home and did well for about 5 days. He states that he was not constipated. He subsequently had a bowel movement Thursday evening he began to pass bright red blood. He denies any abdominal pain.  Past Medical History  Diagnosis Date  . Diabetes mellitus   . Asthma   . Coronary artery disease   . Hypertension   . Hypercholesterolemia   . Hypothyroidism   . Anxiety   . COPD (chronic obstructive pulmonary disease)   . GERD (gastroesophageal reflux disease)   . CHF (congestive heart failure)     diastolic   . Sleep apnea     severe OSA awaiting CPAP titration  . Peripheral vascular disease 11/2006    s/p left stent  . Shortness of breath     Past Surgical History  Procedure Date  . Coronary stent placement 2010 and 2012  . Hernia repair 1992    evntral hernia repair  . Esophagogastroduodenoscopy 12/26/2011    Procedure: ESOPHAGOGASTRODUODENOSCOPY (EGD);  Surgeon: Charolett Bumpers, MD;  Location: [facility];  Service: Endoscopy;  Laterality: N/A;  . Balloon dilation 12/26/2011    Procedure: BALLOON DILATION;  Surgeon: Charolett Bumpers, MD;  Location: [facility];  Service: Endoscopy;  Laterality: N/A;  . Esophagogastroduodenoscopy 08/13/2012    Procedure: ESOPHAGOGASTRODUODENOSCOPY (EGD);  Surgeon: Willis Modena, MD;  Location: [facility];  Service: Endoscopy;  Laterality: Left;  .  Colonoscopy 08/14/2012    Procedure: COLONOSCOPY;  Surgeon: Willis Modena, MD;  Location: [facility];  Service: Endoscopy;  Laterality: N/A;    Family History  Problem Relation Age of Onset  . Malignant hyperthermia Neg Hx   . Diabetes Mother   . Hyperlipidemia Mother   . Hypertension Mother   . Heart attack Mother   . Cancer Sister   . Diabetes Sister   . Hyperlipidemia Sister   . Hypertension Sister   . Heart attack Sister   . Hyperlipidemia Sister   . Hypertension Sister     Social History:  reports that he quit smoking about 53 years ago. His smoking use included Cigarettes and Cigars. He quit after 15 years of use. He has never used smokeless tobacco. He reports that he does not drink alcohol or use illicit drugs.  Allergies:  Allergies  Allergen Reactions  . Clopidogrel Bisulfate Itching    Medications;    . budesonide-formoterol  2 puff Inhalation BID  . ezetimibe-simvastatin  1 tablet Oral QHS  . furosemide  40 mg Oral Daily  . insulin aspart  0-9 Units Subcutaneous TID WC  . isosorbide mononitrate  60 mg Oral QHS  . levothyroxine  175 mcg Oral QAC breakfast  . metFORMIN  500 mg Oral BID WC  . metoprolol tartrate  25 mg Oral Daily  . pantoprazole (PROTONIX) IV  40 mg Intravenous Q12H  . sodium chloride  3 mL Intravenous Q12H   PRN Meds acetaminophen, acetaminophen, albuterol,  diazepam, HYDROcodone-acetaminophen, loperamide, morphine injection, nitroGLYCERIN, ondansetron Results for orders placed during the hospital encounter of 08/21/12 (from the past 48 hour(s))  BASIC METABOLIC PANEL     Status: Abnormal   Collection Time   08/21/12  5:35 PM      Component Value Range Comment   Sodium 134 (*) 135 - 145 mEq/L    Potassium 3.8  3.5 - 5.1 mEq/L    Chloride 100  96 - 112 mEq/L    CO2 26  19 - 32 mEq/L    Glucose, Bld 90  70 - 99 mg/dL    BUN 14  6 - 23 mg/dL    Creatinine, Ser 1.61  0.50 - 1.35 mg/dL    Calcium 9.4  8.4 - 09.6 mg/dL    GFR calc non Af  Amer 78 (*) >90 mL/min    GFR calc Af Amer >90  >90 mL/min   CBC WITH DIFFERENTIAL     Status: Abnormal   Collection Time   08/21/12  5:35 PM      Component Value Range Comment   WBC 7.2  4.0 - 10.5 K/uL    RBC 4.28  4.22 - 5.81 MIL/uL    Hemoglobin 12.4 (*) 13.0 - 17.0 g/dL    HCT 04.5 (*) 40.9 - 52.0 %    MCV 85.7  78.0 - 100.0 fL    MCH 29.0  26.0 - 34.0 pg    MCHC 33.8  30.0 - 36.0 g/dL    RDW 81.1  91.4 - 78.2 %    Platelets 182  150 - 400 K/uL    Neutrophils Relative 53  43 - 77 %    Neutro Abs 3.8  1.7 - 7.7 K/uL    Lymphocytes Relative 38  12 - 46 %    Lymphs Abs 2.7  0.7 - 4.0 K/uL    Monocytes Relative 7  3 - 12 %    Monocytes Absolute 0.5  0.1 - 1.0 K/uL    Eosinophils Relative 2  0 - 5 %    Eosinophils Absolute 0.1  0.0 - 0.7 K/uL    Basophils Relative 1  0 - 1 %    Basophils Absolute 0.0  0.0 - 0.1 K/uL   OCCULT BLOOD, POC DEVICE     Status: Normal   Collection Time   08/21/12  5:58 PM      Component Value Range Comment   Fecal Occult Bld POSITIVE     PREPARE RBC (CROSSMATCH)     Status: Normal   Collection Time   08/21/12  9:00 PM      Component Value Range Comment   Order Confirmation ORDER PROCESSED BY BLOOD BANK     HEMOGLOBIN AND HEMATOCRIT, BLOOD     Status: Abnormal   Collection Time   08/21/12  9:30 PM      Component Value Range Comment   Hemoglobin 12.1 (*) 13.0 - 17.0 g/dL    HCT 95.6 (*) 21.3 - 52.0 %   PROTIME-INR     Status: Normal   Collection Time   08/21/12 10:50 PM      Component Value Range Comment   Prothrombin Time 13.2  11.6 - 15.2 seconds    INR 1.01  0.00 - 1.49   TYPE AND SCREEN     Status: Normal (Preliminary result)   Collection Time   08/21/12 10:50 PM      Component Value Range Comment   ABO/RH(D) AB POS  Antibody Screen NEG      Sample Expiration 08/24/2012      Unit Number [id number]      Blood Component Type RED CELLS,LR      Unit division 00      Status of Unit ALLOCATED      Transfusion Status OK TO TRANSFUSE        Crossmatch Result Compatible      Unit Number [id number]      Blood Component Type RED CELLS,LR      Unit division 00      Status of Unit ALLOCATED      Transfusion Status OK TO TRANSFUSE      Crossmatch Result Compatible     HEMOGLOBIN AND HEMATOCRIT, BLOOD     Status: Abnormal   Collection Time   08/22/12  3:00 AM      Component Value Range Comment   Hemoglobin 11.7 (*) 13.0 - 17.0 g/dL    HCT 25.3 (*) 66.4 - 52.0 %     No results found. ROS: Constitutional:Some weakness and fatigue  HEENT:Negative  Cardiovascular:History of multiple MIs angina and cardiac stents  Respiratory:Negative  GI:As above  QI:[id number] other than BPH  Musculoskeletal: Neuro/Psychiatric: Endocrine/Heme:Diabetes            Blood pressure 135/70, pulse 75, temperature 98.4 F (36.9 C), temperature source Oral, resp. rate 20, height 5\' 4"  (1.626 m), weight 86.3 kg (190 lb 4.1 oz), SpO2 93.00%.  Physical exam:   Gen.-African American male in no distress. Eyes sclera nonicteric Lungs-clear Heart-regular rhythm without murmurs or gallops Abdomen soft and nontender  Assessment: Lower GI bleed-this is almost certainly diverticular. I suspect that the patient will need to have his antiplatelet agents held more extensive period of time.  Plan: 1. Continue to hold antiplatelet agents for now. 2. We'll start on clear with MiraLAX and try to get him cleaned out. We may need to send him out on low-dose aspirin and hold other antiplatelet agents such as Effient or longer.   Lily Kernen JR,Ting Cage L 08/22/2012, 8:13 AM

## 2012-08-23 LAB — CBC
HCT: 36 % — ABNORMAL LOW (ref 39.0–52.0)
Hemoglobin: 12 g/dL — ABNORMAL LOW (ref 13.0–17.0)
MCV: 86.1 fL (ref 78.0–100.0)
RBC: 4.18 MIL/uL — ABNORMAL LOW (ref 4.22–5.81)
WBC: 7 10*3/uL (ref 4.0–10.5)

## 2012-08-23 NOTE — Progress Notes (Signed)
Pt discharged to home. DC instructions given with daughter at bedside . No concerns voiced. Left unit in wheelchair pushed by nurse tech accompanied by daughter. Left in good condition.

## 2012-08-23 NOTE — Progress Notes (Signed)
[facility] PROGRESS NOTE Subjective Patient reports that stools are now brown. Cardiology this suggested discharge on aspirin alone holding other antiplatelet agents. He reports that his stools were generally loose. Objective: Vital signs in last 24 hours: Temp:  [97.5 F (36.4 C)-98.3 F (36.8 C)] 98.3 F (36.8 C) (11/10 0659) Pulse Rate:  [74-76] 74  (11/09 2245) Resp:  [18-20] 18  (11/10 0659) BP: (108-163)/(61-81) 108/61 mmHg (11/10 0659) SpO2:  [93 %-98 %] 98 % (11/10 0659) Last BM Date: 08/22/12  Intake/Output from previous day: 11/09 0701 - 11/10 0700 In: 1671.3 [P.O.:240; I.V.:1421.3; IV Piggyback:10] Out: -  Intake/Output this shift:    PE: Abdomen-soft and nontender  Lab Results:  Basename 08/23/12 0550 08/22/12 0858 08/22/12 0300 08/21/12 2130 08/21/12 1735  WBC 7.0 -- -- -- 7.2  HGB 12.0* 12.1* 11.7* 12.1* 12.4*  HCT 36.0* 36.6* 34.9* 35.4* 36.7*  PLT 177 -- -- -- 182   BMET  Basename 08/21/12 1735  NA 134*  K 3.8  CL 100  CO2 26  CREATININE 0.90   LFT No results found for this basename: PROT:3ALBUMIN:3,AST:3,ALT:3,ALKPHOS:3,BILITOT:3,BILIDIR:3,IBILI:3 in the last 72 hours PT/INR  Basename 08/21/12 2250  LABPROT 13.2  INR 1.01   PANCREAS No results found for this basename: LIPASE:3 in the last 72 hours       Studies/Results: No results found.  Medications: I have reviewed the patient's current medications.  Assessment/Plan: 1. Lower GI bleed almost certainly diverticular. Hemoglobin is stable and stools and cleared up. I think he can be discharged home and per cardiology would go ahead and discharge him aspirin alone and have him followup with his cardiologist. Explain to he and his wife to need to stay in some dose of MiraLAX indefinitely to keep his stools soft. Please calls for any other problems   Rayden Dock JR,Undine Nealis L 08/23/2012, 8:15 AM

## 2012-08-23 NOTE — Discharge Summary (Signed)
Physician Discharge Summary  Patient ID: JASYAH THEURER MRN: [id number] DOB/AGE: 1930-07-08 78 y.o.  Admit date: 08/21/2012 Discharge date: 08/23/2012  Primary Care Physician:  Lupita Raider, MD   Discharge Diagnoses:    Active Problems:  ASTHMA  PVD (peripheral vascular disease)  GI bleed  CAD (coronary artery disease)      Medication List     As of 08/23/2012  1:15 PM    STOP taking these medications         aspirin EC 325 MG tablet      EFFIENT 10 MG Tabs   Generic drug: prasugrel      TAKE these medications         albuterol 108 (90 BASE) MCG/ACT inhaler   Commonly known as: PROVENTIL HFA;VENTOLIN HFA   Inhale 2 puffs into the lungs every 6 (six) hours as needed.      budesonide-formoterol 80-4.5 MCG/ACT inhaler   Commonly known as: SYMBICORT   Inhale 2 puffs into the lungs daily as needed. FOR WHEEZING      diazepam 5 MG tablet   Commonly known as: VALIUM   Take 1 tablet (5 mg total) by mouth daily as needed. He takes only when having procedures done for anxiety.      ezetimibe-simvastatin 10-80 MG per tablet   Commonly known as: VYTORIN   Take 1 tablet by mouth at bedtime.      furosemide 40 MG tablet   Commonly known as: LASIX   Take 40 mg by mouth daily as needed. For swelling or fluid retention.      glimepiride 2 MG tablet   Commonly known as: AMARYL   Take 2 mg by mouth daily.      isosorbide mononitrate 60 MG 24 hr tablet   Commonly known as: IMDUR   Take 60 mg by mouth at bedtime.      levothyroxine 175 MCG tablet   Commonly known as: SYNTHROID, LEVOTHROID   Take 175 mcg by mouth daily before breakfast.      loperamide 2 MG tablet   Commonly known as: IMODIUM A-D   Take 2 mg by mouth 4 (four) times daily as needed. For diarrhea.      metFORMIN 500 MG tablet   Commonly known as: GLUCOPHAGE   Take 500 mg by mouth 2 (two) times daily.      metoprolol tartrate 25 MG tablet   Commonly known as: LOPRESSOR   Take 25 mg by mouth daily.       nitroGLYCERIN 0.4 MG SL tablet   Commonly known as: NITROSTAT   Place 0.4 mg under the tongue every 5 (five) minutes x 3 doses as needed. For chest pain.      ondansetron 4 MG tablet   Commonly known as: ZOFRAN   Take 1 tablet (4 mg total) by mouth every 6 (six) hours as needed for nausea.         Disposition and Follow-up:  Will be discharged home today in stable and improved condition. Needs to follow up with his PCP and cardiologist in 2 weeks.  Consults:  Gastroenterology, Dr. Randa Evens.         Cardiology, Dr. Eden Emms.   Significant Diagnostic Studies:  No results found.  Brief H and P: For complete details please refer to admission H and P, but in brief patient is an 78 y.o. male, with a past medical history significant for coronary artery disease, diabetes, asthma and was recently discharged from our hospital after he  was admitted with GI bleed; his workup at that time included an EGD which was negative and a colonoscopy which showed diverticulosis but no active bleeding, presenting again with the same problem. Yesterday at night he had frank blood per rectum. The patient is on aspirin and effient . We were asked to admit him for further evaluation and management.     Hospital Course:  Active Problems:  ASTHMA  PVD (peripheral vascular disease)  GI bleed  CAD (coronary artery disease)   GI Bleed  -Recent EGD and colonoscopy that were normal except for diverticulosis.  -Bleeding is presumed diverticular in nature.  -Hb has been stable.  -No indication for transfusion at this point. -Cardiology agrees it is safe to hold Effient indefinitely. -We will also hold ASA for 2 weeks. He has been instructed to restart ASA in 2 weeks.  Rest of chronic medical issues have been stable.   Time spent on Discharge: Greater than 30 minutes.  SignedChaya Jan [facility] Pager: [phone] 08/23/2012, 1:15 PM

## 2012-08-23 NOTE — Progress Notes (Signed)
   Subjective:  No chest pain or dyspnea. Patient has seen no further rectal bleeding overnight.  Objective:  Vital Signs in the last 24 hours: Temp:  [97.5 F (36.4 C)-98.3 F (36.8 C)] 98.3 F (36.8 C) (11/10 0659) Pulse Rate:  [74-76] 74  (11/09 2245) Resp:  [18-20] 18  (11/10 0659) BP: (108-163)/(61-81) 108/61 mmHg (11/10 0659) SpO2:  [93 %-98 %] 98 % (11/10 0659)  Intake/Output from previous day: 11/09 0701 - 11/10 0700 In: 1671.3 [P.O.:240; I.V.:1421.3; IV Piggyback:10] Out: -  Intake/Output from this shift:       . budesonide-formoterol  2 puff Inhalation BID  . ezetimibe-simvastatin  1 tablet Oral QHS  . furosemide  40 mg Oral Daily  . insulin aspart  0-9 Units Subcutaneous TID WC  . isosorbide mononitrate  60 mg Oral QHS  . levothyroxine  175 mcg Oral QAC breakfast  . metFORMIN  500 mg Oral BID WC  . metoprolol tartrate  25 mg Oral Daily  . pantoprazole (PROTONIX) IV  40 mg Intravenous Q12H  . polyethylene glycol  17 g Oral TID  . sodium chloride  3 mL Intravenous Q12H      . sodium chloride 75 mL/hr at 08/23/12 0036    Physical Exam: The patient appears to be in no distress.  Head and neck exam reveals that the pupils are equal and reactive.  The extraocular movements are full.  There is no scleral icterus.  Mouth and pharynx are benign.  No lymphadenopathy.  No carotid bruits.  The jugular venous pressure is normal.  Thyroid is not enlarged or tender.  Chest is clear to percussion and auscultation.  No rales or rhonchi.  Expansion of the chest is symmetrical.  Heart reveals no abnormal lift or heave.  First and second heart sounds are normal.  There is no murmur gallop rub or click.  The abdomen is soft and nontender.  Bowel sounds are normoactive.  There is no hepatosplenomegaly or mass.  There are no abdominal bruits.  Extremities reveal no phlebitis or edema.  Pedal pulses are good.  There is no cyanosis or clubbing.  Neurologic exam is normal  strength and no lateralizing weakness.  No sensory deficits.  Integument reveals no rash  Lab Results:  Basename 08/23/12 0550 08/22/12 0858 08/21/12 1735  WBC 7.0 -- 7.2  HGB 12.0* 12.1* --  PLT 177 -- 182    Basename 08/21/12 1735  NA 134*  K 3.8  CL 100  CO2 26  GLUCOSE 90  BUN 14  CREATININE 0.90   No results found for this basename: TROPONINI:2,CK,MB:2 in the last 72 hours Hepatic Function Panel No results found for this basename: PROT,ALBUMIN,AST,ALT,ALKPHOS,BILITOT,BILIDIR,IBILI in the last 72 hours No results found for this basename: CHOL in the last 72 hours No results found for this basename: PROTIME in the last 72 hours  Imaging: Imaging results have been reviewed  Cardiac Studies: Telemetry shows NSR with occ PVCs. Assessment/Plan:  Patient Active Hospital Problem List:  CAD (coronary artery disease) (08/22/2012)   Assessment: No symptoms of angina.   Plan: He will continue ASA 81 daily when okay with GI but he does not need to resume Effient in future.   LOS: 2 days    [name] 08/23/2012, 7:13 AM

## 2012-08-25 LAB — TYPE AND SCREEN

## 2012-11-19 ENCOUNTER — Encounter: Payer: Self-pay | Admitting: Neurosurgery

## 2012-11-20 ENCOUNTER — Encounter (INDEPENDENT_AMBULATORY_CARE_PROVIDER_SITE_OTHER): Payer: Medicare HMO | Admitting: *Deleted

## 2012-11-20 ENCOUNTER — Ambulatory Visit (INDEPENDENT_AMBULATORY_CARE_PROVIDER_SITE_OTHER): Payer: Medicare HMO | Admitting: Neurosurgery

## 2012-11-20 ENCOUNTER — Encounter: Payer: Self-pay | Admitting: Neurosurgery

## 2012-11-20 VITALS — BP 136/75 | HR 83 | Resp 16 | Ht 64.0 in | Wt 190.0 lb

## 2012-11-20 DIAGNOSIS — I739 Peripheral vascular disease, unspecified: Secondary | ICD-10-CM

## 2012-11-20 DIAGNOSIS — M79609 Pain in unspecified limb: Secondary | ICD-10-CM

## 2012-11-20 DIAGNOSIS — Z48812 Encounter for surgical aftercare following surgery on the circulatory system: Secondary | ICD-10-CM

## 2012-11-20 DIAGNOSIS — M79652 Pain in left thigh: Secondary | ICD-10-CM | POA: Insufficient documentation

## 2012-11-20 DIAGNOSIS — R29898 Other symptoms and signs involving the musculoskeletal system: Secondary | ICD-10-CM

## 2012-11-20 DIAGNOSIS — I70219 Atherosclerosis of native arteries of extremities with intermittent claudication, unspecified extremity: Secondary | ICD-10-CM

## 2012-11-20 NOTE — Progress Notes (Signed)
[facility] PAD/PVD Office Note  CC: PAD surveillance Referring Physician: Hart Rochester  History of Present Illness: 79 year old male patient of Dr. Hart Rochester status post left common iliac artery angioplasty and stent in 2008. The patient denies true claudication or rest pain however he does have left buttock pain when he walks a distance. The patient denies any other medical diagnoses or recent surgery.  Past Medical History  Diagnosis Date  . Diabetes mellitus   . Asthma   . Coronary artery disease   . Hypertension   . Hypercholesterolemia   . Hypothyroidism   . Anxiety   . COPD (chronic obstructive pulmonary disease)   . GERD (gastroesophageal reflux disease)   . CHF (congestive heart failure)     diastolic   . Sleep apnea     severe OSA awaiting CPAP titration  . Peripheral vascular disease 11/2006    s/p left stent  . Shortness of breath   . DVT (deep venous thrombosis)   . Diverticulitis Oct. 2013    ROS: [x]  Positive   [ ]  Denies    General: [ ]  Weight loss, [ ]  Fever, [ ]  chills Neurologic: [ ]  Dizziness, [ ]  Blackouts, [ ]  Seizure [ ]  Stroke, [ ]  "Mini stroke", [ ]  Slurred speech, [ ]  Temporary blindness; [x ] weakness in arms or legs, [ ]  Hoarseness Cardiac: [ ]  Chest pain/pressure, [x ] Shortness of breath at rest [x ] Shortness of breath with exertion, [ ]  Atrial fibrillation or irregular heartbeat Vascular: [x ] Pain in legs with walking, [ ]  Pain in legs at rest, [ ]  Pain in legs at night,  [ ]  Non-healing ulcer, [ ]  Blood clot in vein/DVT,   Pulmonary: [ ]  Home oxygen, [ ]  Productive cough, [ ]  Coughing up blood, [x ] Asthma,  [x ] Wheezing Musculoskeletal:  [ ]  Arthritis, [ ]  Low back pain, [ ]  Joint pain Hematologic: [ ]  Easy Bruising, [ ]  Anemia; [ ]  Hepatitis Gastrointestinal: [ ]  Blood in stool, [ ]  Gastroesophageal Reflux/heartburn, [ ]  Trouble swallowing Urinary: [ ]  chronic Kidney disease, [ ]  on HD - [ ]  MWF or [ ]  TTHS, [ ]   Burning with urination, [ ]  Difficulty urinating Skin: [ ]  Rashes, [ ]  Wounds Psychological: [ ]  Anxiety, [ ]  Depression   Social History History  Substance Use Topics  . Smoking status: Former Smoker -- 15 years    Types: Cigarettes, Cigars    Quit date: 10/14/1958  . Smokeless tobacco: Never Used  . Alcohol Use: No    Family History Family History  Problem Relation Age of Onset  . Malignant hyperthermia Neg Hx   . Diabetes Mother   . Hyperlipidemia Mother   . Hypertension Mother   . Heart attack Mother   . Cancer Sister   . Diabetes Sister   . Hyperlipidemia Sister   . Hypertension Sister   . Heart attack Sister   . Hyperlipidemia Sister   . Hypertension Sister   . Heart disease Sister     Heart Disease before age 20  . Diabetes Brother     Allergies  Allergen Reactions  . Clopidogrel Bisulfate Itching    Current Outpatient Prescriptions  Medication Sig Dispense Refill  . albuterol (PROVENTIL HFA;VENTOLIN HFA) 108 (90 BASE) MCG/ACT inhaler Inhale 2 puffs into the lungs every 6 (six) hours as needed.      . budesonide-formoterol (SYMBICORT) 80-4.5 MCG/ACT inhaler Inhale 2 puffs into the lungs daily  as needed. FOR WHEEZING      . diazepam (VALIUM) 5 MG tablet Take 1 tablet (5 mg total) by mouth daily as needed. He takes only when having procedures done for anxiety.  30 tablet  1  . ezetimibe-simvastatin (VYTORIN) 10-80 MG per tablet Take 1 tablet by mouth at bedtime.        . furosemide (LASIX) 40 MG tablet Take 40 mg by mouth daily as needed. For swelling or fluid retention.       Marland Kitchen glimepiride (AMARYL) 2 MG tablet Take 2 mg by mouth daily.        . isosorbide mononitrate (IMDUR) 60 MG 24 hr tablet Take 60 mg by mouth at bedtime.       Marland Kitchen levothyroxine (SYNTHROID, LEVOTHROID) 175 MCG tablet Take 175 mcg by mouth daily before breakfast.       . loperamide (IMODIUM A-D) 2 MG tablet Take 2 mg by mouth 4 (four) times daily as needed. For diarrhea.       . metFORMIN  (GLUCOPHAGE) 500 MG tablet Take 500 mg by mouth 2 (two) times daily.       . metoprolol tartrate (LOPRESSOR) 25 MG tablet Take 25 mg by mouth daily.       . nitroGLYCERIN (NITROSTAT) 0.4 MG SL tablet Place 0.4 mg under the tongue every 5 (five) minutes x 3 doses as needed. For chest pain.      Marland Kitchen ondansetron (ZOFRAN) 4 MG tablet Take 1 tablet (4 mg total) by mouth every 6 (six) hours as needed for nausea.  45 tablet  1    Physical Examination  Filed Vitals:   11/20/12 1630  BP: 136/75  Pulse: 83  Resp: 16    Body mass index is 32.61 kg/(m^2).  General:  WDWN in NAD Gait: Normal HEENT: WNL Eyes: Pupils equal Pulmonary: normal non-labored breathing , without Rales, rhonchi,  wheezing Cardiac: RRR, without  Murmurs, rubs or gallops; No carotid bruits Abdomen: soft, NT, no masses Skin: no rashes, ulcers noted Vascular Exam/Pulses: Palpable femoral pulses however lower show a pulses are not palpable  Extremities without ischemic changes, no Gangrene , no cellulitis; no open wounds;  Musculoskeletal: no muscle wasting or atrophy  Neurologic: A&O X 3; Appropriate Affect ; SENSATION: normal; MOTOR FUNCTION:  moving all extremities equally. Speech is fluent/normal  Non-Invasive Vascular Imaging: ABIs today are diminishing previous exam in August 2013, he is 0.79 and biphasic to monophasic on the right, 0.65 within an audible PT and biphasic DP  ASSESSMENT/PLAN: I spoke with Dr. Imogene Burn regarding the findings who recommends followup with Dr. Hart Rochester to further evaluate the problem however the patient first declined followup sooner than 3 months and now has agreed to come back in the next month. We will obtain aortoiliac duplex at that time per Dr. Nicky Pugh recommendations. The patient's questions were encouraged and answered, he is in agreement with this plan.  Lauree Chandler ANP  Clinic M.D.: Imogene Burn

## 2012-11-24 ENCOUNTER — Other Ambulatory Visit: Payer: Self-pay | Admitting: *Deleted

## 2012-11-24 DIAGNOSIS — I739 Peripheral vascular disease, unspecified: Secondary | ICD-10-CM

## 2012-11-24 DIAGNOSIS — Z48812 Encounter for surgical aftercare following surgery on the circulatory system: Secondary | ICD-10-CM

## 2012-12-21 ENCOUNTER — Encounter: Payer: Self-pay | Admitting: Vascular Surgery

## 2012-12-22 ENCOUNTER — Encounter: Payer: Self-pay | Admitting: Vascular Surgery

## 2012-12-22 ENCOUNTER — Other Ambulatory Visit (INDEPENDENT_AMBULATORY_CARE_PROVIDER_SITE_OTHER): Payer: Medicare HMO | Admitting: *Deleted

## 2012-12-22 ENCOUNTER — Ambulatory Visit (INDEPENDENT_AMBULATORY_CARE_PROVIDER_SITE_OTHER): Payer: Medicare HMO | Admitting: Vascular Surgery

## 2012-12-22 VITALS — BP 150/87 | HR 80 | Resp 18 | Ht 64.0 in | Wt 190.0 lb

## 2012-12-22 DIAGNOSIS — I739 Peripheral vascular disease, unspecified: Secondary | ICD-10-CM

## 2012-12-22 DIAGNOSIS — Z48812 Encounter for surgical aftercare following surgery on the circulatory system: Secondary | ICD-10-CM

## 2012-12-22 NOTE — Progress Notes (Signed)
Subjective:     Patient ID: [name], male   DOB: 07/19/1930, 79 y.o.   MRN: [id number]  HPI This 79 year old male returns today for further evaluation of his bilateral hip claudication left worse than right. He had iliac stent placed in 2008. His ABI has not changed much over the years being roughly 0.8 in both lower extremities. He does state that his left buttock and thigh area gait very tight after walking a short distance in both legs become weak left worse than right. This is limiting him significantly.   Past Medical History  Diagnosis Date  . Diabetes mellitus   . Asthma   . Coronary artery disease   . Hypertension   . Hypercholesterolemia   . Hypothyroidism   . Anxiety   . COPD (chronic obstructive pulmonary disease)   . GERD (gastroesophageal reflux disease)   . CHF (congestive heart failure)     diastolic   . Sleep apnea     severe OSA awaiting CPAP titration  . Peripheral vascular disease 11/2006    s/p left stent  . Shortness of breath   . DVT (deep venous thrombosis)   . Diverticulitis Oct. 2013    History  Substance Use Topics  . Smoking status: Former Smoker -- 15 years    Types: Cigarettes, Cigars    Quit date: 10/14/1958  . Smokeless tobacco: Never Used  . Alcohol Use: No    Family History  Problem Relation Age of Onset  . Malignant hyperthermia Neg Hx   . Diabetes Mother   . Hyperlipidemia Mother   . Hypertension Mother   . Heart attack Mother   . Cancer Sister   . Diabetes Sister   . Hyperlipidemia Sister   . Hypertension Sister   . Heart attack Sister   . Hyperlipidemia Sister   . Hypertension Sister   . Heart disease Sister     Heart Disease before age 42  . Diabetes Brother     Allergies  Allergen Reactions  . Clopidogrel Bisulfate Itching    Current outpatient prescriptions:albuterol (PROVENTIL HFA;VENTOLIN HFA) 108 (90 BASE) MCG/ACT inhaler, Inhale 2 puffs into the lungs every 6 (six) hours as needed., Disp: , Rfl: ;   budesonide-formoterol (SYMBICORT) 80-4.5 MCG/ACT inhaler, Inhale 2 puffs into the lungs daily as needed. FOR WHEEZING, Disp: , Rfl:  diazepam (VALIUM) 5 MG tablet, Take 1 tablet (5 mg total) by mouth daily as needed. He takes only when having procedures done for anxiety., Disp: 30 tablet, Rfl: 1;  ezetimibe-simvastatin (VYTORIN) 10-80 MG per tablet, Take 1 tablet by mouth at bedtime.  , Disp: , Rfl: ;  furosemide (LASIX) 40 MG tablet, Take 40 mg by mouth daily as needed. For swelling or fluid retention. , Disp: , Rfl:  glimepiride (AMARYL) 2 MG tablet, Take 2 mg by mouth daily.  , Disp: , Rfl: ;  isosorbide mononitrate (IMDUR) 60 MG 24 hr tablet, Take 60 mg by mouth at bedtime. , Disp: , Rfl: ;  levothyroxine (SYNTHROID, LEVOTHROID) 175 MCG tablet, Take 175 mcg by mouth daily before breakfast. , Disp: , Rfl: ;  loperamide (IMODIUM A-D) 2 MG tablet, Take 2 mg by mouth 4 (four) times daily as needed. For diarrhea. , Disp: , Rfl:  metFORMIN (GLUCOPHAGE) 500 MG tablet, Take 500 mg by mouth 2 (two) times daily. , Disp: , Rfl: ;  metoprolol tartrate (LOPRESSOR) 25 MG tablet, Take 25 mg by mouth daily. , Disp: , Rfl: ;  nitroGLYCERIN (NITROSTAT) 0.4  MG SL tablet, Place 0.4 mg under the tongue every 5 (five) minutes x 3 doses as needed. For chest pain., Disp: , Rfl:  ondansetron (ZOFRAN) 4 MG tablet, Take 1 tablet (4 mg total) by mouth every 6 (six) hours as needed for nausea., Disp: 45 tablet, Rfl: 1  BP 150/87  Pulse 80  Resp 18  Ht 5\' 4"  (1.626 m)  Wt 190 lb (86.183 kg)  BMI 32.6 kg/m2  Body mass index is 32.6 kg/(m^2).        Review of Systemsdenies chest pain but does have dyspnea on exertion. Has history coronary artery disease with previous stents. Biggest complaint is lower extremity claudication denies hemoptysis or lateralizing weakness     Objective:   Physical Exam blood pressure 150/87 heart rate 80 respirations 18 Gen.-alert and oriented x3 in no apparent distress HEENT normal for  age Lungs no rhonchi or wheezing Cardiovascular regular rhythm no murmurs carotid pulses 3+ palpable no bruits audible Abdomen soft nontender no palpable masses-Obese Musculoskeletal free of  major deformities Skin clear -no rashes Neurologic normal Lower extremities 3+ femoral and absent popliteal and distal pulses-both feet well perfused  Today I ordered lower extremity arterial duplex scan. This is very technically difficult study because of his body habitus and bowel gas. He does have triphasic flow in the external iliacs bilaterally. ABIs at last visit were 0.8 bilaterally.       Assessment:     Status post left iliac stent 2008 now with worsening claudication left worse than right-limiting him significantly     Plan:     Plan aortobifemoral angiography with bilateral lower extremity runoff possible stenting left and/or right if feasible-we'll not plan lower extremity bypass on this patient but if he can be improved by endovascular procedure that would be beneficial. Angiogram scheduled by Dr. Myra Gianotti on Tuesday, March 18

## 2012-12-23 ENCOUNTER — Other Ambulatory Visit: Payer: Self-pay

## 2012-12-28 ENCOUNTER — Encounter (HOSPITAL_COMMUNITY): Payer: Self-pay | Admitting: Pharmacy Technician

## 2012-12-28 MED ORDER — SODIUM CHLORIDE 0.9 % IV SOLN: INTRAVENOUS | Status: DC

## 2012-12-29 ENCOUNTER — Encounter (HOSPITAL_COMMUNITY)
Admission: RE | Disposition: A | Discharge status: Home / Self Care | Payer: Self-pay | Source: Ambulatory Visit | Attending: Surgery

## 2012-12-29 ENCOUNTER — Ambulatory Visit (HOSPITAL_COMMUNITY)
Admission: RE | Admit: 2012-12-29 | Discharge: 2012-12-29 | Disposition: A | Discharge status: Home / Self Care | Payer: Medicare HMO | Source: Ambulatory Visit | Attending: Surgery | Admitting: Surgery

## 2012-12-29 ENCOUNTER — Ambulatory Visit (HOSPITAL_COMMUNITY): Admission: RE | Admit: 2012-12-29 | Payer: Medicare HMO | Source: Ambulatory Visit | Admitting: Surgery

## 2012-12-29 DIAGNOSIS — M79609 Pain in unspecified limb: Secondary | ICD-10-CM | POA: Insufficient documentation

## 2012-12-29 DIAGNOSIS — IMO0001 Reserved for inherently not codable concepts without codable children: Secondary | ICD-10-CM | POA: Insufficient documentation

## 2012-12-29 DIAGNOSIS — I70219 Atherosclerosis of native arteries of extremities with intermittent claudication, unspecified extremity: Secondary | ICD-10-CM

## 2012-12-29 HISTORY — PX: ABDOMINAL AORTAGRAM: SHX5454

## 2012-12-29 HISTORY — PX: LOWER EXTREMITY ANGIOGRAM: SHX5508

## 2012-12-29 LAB — POCT I-STAT, CHEM 8
Creatinine, Ser: 0.9 mg/dL (ref 0.50–1.35)
Hemoglobin: 15.6 g/dL (ref 13.0–17.0)
Sodium: 140 mEq/L (ref 135–145)
TCO2: 30 mmol/L (ref 0–100)

## 2012-12-29 LAB — GLUCOSE, CAPILLARY: Glucose-Capillary: 101 mg/dL — ABNORMAL HIGH (ref 70–99)

## 2012-12-29 SURGERY — ABDOMINAL AORTAGRAM
Anesthesia: LOCAL

## 2012-12-29 MED ORDER — SODIUM CHLORIDE 0.9 % IV SOLN
INTRAVENOUS | Status: DC
  Administered 2012-12-29: 12:00:00 via INTRAVENOUS

## 2012-12-29 MED ORDER — MIDAZOLAM HCL 2 MG/2ML IJ SOLN
INTRAMUSCULAR | Status: AC
  Filled 2012-12-29: qty 2

## 2012-12-29 MED ORDER — LIDOCAINE HCL (PF) 1 % IJ SOLN
INTRAMUSCULAR | Status: AC
  Filled 2012-12-29: qty 30

## 2012-12-29 MED ORDER — FENTANYL CITRATE 0.05 MG/ML IJ SOLN
INTRAMUSCULAR | Status: AC
  Filled 2012-12-29: qty 2

## 2012-12-29 MED ORDER — HEPARIN (PORCINE) IN NACL 2-0.9 UNIT/ML-% IJ SOLN
INTRAMUSCULAR | Status: AC
  Filled 2012-12-29: qty 1500

## 2012-12-29 NOTE — Op Note (Signed)
[facility]   Patient name: [name] MRN: [id number] DOB: 04-Aug-1930 Sex: male  12/29/2012 Pre-operative Diagnosis: Left buttock and thigh pain Post-operative diagnosis:  Same Surgeon:  Jorge Ny Procedure Performed:  1.  ultrasound-guided access, left femoral artery  2.  abdominal aortogram  3.  bilateral lower extremity runoff   Indications:  The patient is previously undergone stenting of his left iliac arterial system by Dr. Edilia Bo. He presented with recurrent problems with pain in his left buttock and left thigh. These can occur with any walking distance. Ultrasound did not get a good evaluation of his stent and therefore he comes in for aortogram with possible intervention.  Procedure:  The patient was identified in the holding area and taken to room 8.  The patient was then placed supine on the table and prepped and draped in the usual sterile fashion.  A time out was called.  Ultrasound was used to evaluate the left common femoral artery.  It was patent .  A digital ultrasound image was acquired.  A micropuncture needle was used to access the left common femoral artery under ultrasound guidance.  An 018 wire was advanced without resistance and a micropuncture sheath was placed.  The 018 wire was removed and a benson wire was placed.  The micropuncture sheath was exchanged for a 5 french sheath.  An omniflush catheter was advanced over the wire to the level of L-1.  An abdominal angiogram was obtained.  Next, the catheter was pulled down to the aortic bifurcation and pelvic angiography was performed and a right anterior oblique and left anterior oblique position. Next, bilateral lower extremity runoff was obtained.   Findings:   Aortogram:  The visualized portions of the suprarenal abdominal aorta showed no significant stenosis. There is no evidence of renal artery stenosis. The infrarenal abdominal aorta is somewhat ectatic without evidence of  stenosis. Bilateral common iliac arteries are tortuous. The right common and external iliac artery are widely patent without significant stenosis. There is a stent within the left common iliac artery which is widely patent. The left external iliac artery is widely patent. Bilateral hypogastric arteries are occluded  Right Lower Extremity:  The right common femoral artery is widely patent. The right profunda femoral artery is widely patent. The right superficial femoral artery is patent however there is mild stenosis distally. The popliteal artery occludes above the knee. There is reconstitution of the posterior tibial and peroneal arteries.  Left Lower Extremity:  Left common femoral artery is widely patent as is the left profunda femoral artery left superficial femoral artery is patent without significant stenosis. The left popliteal artery is occluded with reconstitution of the below knee popliteal artery. There is three-vessel runoff however visualization was somewhat limited due to proximal disease.  Intervention:  Pullback pressures were checked across the stent and left common iliac artery. There is a gradient of 10-15. I did not feel that this was significant.  Impression:  #1  no significant aortic stenosis. Bilateral common and external iliac arteries are patent without significant stenosis.  #2  bilateral hypogastric artery occlusion which likely explains the patient's symptoms  #3  bilateral popliteal artery occluded    V. Durene Cal, M.D. [facility] Office: [phone] Pager:  [phone]

## 2012-12-29 NOTE — H&P (View-Only) (Signed)
Subjective:     Patient ID: [name], male   DOB: [birth year], 79 y.o.   MRN: [id number]  HPI This 79 year old male returns today for further evaluation of his bilateral hip claudication left worse than right. He had iliac stent placed in 2008. His ABI has not changed much over the years being roughly 0.8 in both lower extremities. He does state that his left buttock and thigh area gait very tight after walking a short distance in both legs become weak left worse than right. This is limiting him significantly.   Past Medical History  Diagnosis Date  . Diabetes mellitus   . Asthma   . Coronary artery disease   . Hypertension   . Hypercholesterolemia   . Hypothyroidism   . Anxiety   . COPD (chronic obstructive pulmonary disease)   . GERD (gastroesophageal reflux disease)   . CHF (congestive heart failure)     diastolic   . Sleep apnea     severe OSA awaiting CPAP titration  . Peripheral vascular disease 11/2006    s/p left stent  . Shortness of breath   . DVT (deep venous thrombosis)   . Diverticulitis Oct. 2013    History  Substance Use Topics  . Smoking status: Former Smoker -- 15 years    Types: Cigarettes, Cigars    Quit date: 10/14/1958  . Smokeless tobacco: Never Used  . Alcohol Use: No    Family History  Problem Relation Age of Onset  . Malignant hyperthermia Neg Hx   . Diabetes Mother   . Hyperlipidemia Mother   . Hypertension Mother   . Heart attack Mother   . Cancer Sister   . Diabetes Sister   . Hyperlipidemia Sister   . Hypertension Sister   . Heart attack Sister   . Hyperlipidemia Sister   . Hypertension Sister   . Heart disease Sister     Heart Disease before age 60  . Diabetes Brother     Allergies  Allergen Reactions  . Clopidogrel Bisulfate Itching    Current outpatient prescriptions:albuterol (PROVENTIL HFA;VENTOLIN HFA) 108 (90 BASE) MCG/ACT inhaler, Inhale 2 puffs into the lungs every 6 (six) hours as needed., Disp: , Rfl: ;   budesonide-formoterol (SYMBICORT) 80-4.5 MCG/ACT inhaler, Inhale 2 puffs into the lungs daily as needed. FOR WHEEZING, Disp: , Rfl:  diazepam (VALIUM) 5 MG tablet, Take 1 tablet (5 mg total) by mouth daily as needed. He takes only when having procedures done for anxiety., Disp: 30 tablet, Rfl: 1;  ezetimibe-simvastatin (VYTORIN) 10-80 MG per tablet, Take 1 tablet by mouth at bedtime.  , Disp: , Rfl: ;  furosemide (LASIX) 40 MG tablet, Take 40 mg by mouth daily as needed. For swelling or fluid retention. , Disp: , Rfl:  glimepiride (AMARYL) 2 MG tablet, Take 2 mg by mouth daily.  , Disp: , Rfl: ;  isosorbide mononitrate (IMDUR) 60 MG 24 hr tablet, Take 60 mg by mouth at bedtime. , Disp: , Rfl: ;  levothyroxine (SYNTHROID, LEVOTHROID) 175 MCG tablet, Take 175 mcg by mouth daily before breakfast. , Disp: , Rfl: ;  loperamide (IMODIUM A-D) 2 MG tablet, Take 2 mg by mouth 4 (four) times daily as needed. For diarrhea. , Disp: , Rfl:  metFORMIN (GLUCOPHAGE) 500 MG tablet, Take 500 mg by mouth 2 (two) times daily. , Disp: , Rfl: ;  metoprolol tartrate (LOPRESSOR) 25 MG tablet, Take 25 mg by mouth daily. , Disp: , Rfl: ;  nitroGLYCERIN (NITROSTAT) 0.4  MG SL tablet, Place 0.4 mg under the tongue every 5 (five) minutes x 3 doses as needed. For chest pain., Disp: , Rfl:  ondansetron (ZOFRAN) 4 MG tablet, Take 1 tablet (4 mg total) by mouth every 6 (six) hours as needed for nausea., Disp: 45 tablet, Rfl: 1  BP 150/87  Pulse 80  Resp 18  Ht 5\' 4"  (1.626 m)  Wt 190 lb (86.183 kg)  BMI 32.6 kg/m2  Body mass index is 32.6 kg/(m^2).        Review of Systemsdenies chest pain but does have dyspnea on exertion. Has history coronary artery disease with previous stents. Biggest complaint is lower extremity claudication denies hemoptysis or lateralizing weakness     Objective:   Physical Exam blood pressure 150/87 heart rate 80 respirations 18 Gen.-alert and oriented x3 in no apparent distress HEENT normal for  age Lungs no rhonchi or wheezing Cardiovascular regular rhythm no murmurs carotid pulses 3+ palpable no bruits audible Abdomen soft nontender no palpable masses-Obese Musculoskeletal free of  major deformities Skin clear -no rashes Neurologic normal Lower extremities 3+ femoral and absent popliteal and distal pulses-both feet well perfused  Today I ordered lower extremity arterial duplex scan. This is very technically difficult study because of his body habitus and bowel gas. He does have triphasic flow in the external iliacs bilaterally. ABIs at last visit were 0.8 bilaterally.       Assessment:     Status post left iliac stent 2008 now with worsening claudication left worse than right-limiting him significantly     Plan:     Plan aortobifemoral angiography with bilateral lower extremity runoff possible stenting left and/or right if feasible-we'll not plan lower extremity bypass on this patient but if he can be improved by endovascular procedure that would be beneficial. Angiogram scheduled by Dr. Myra Gianotti on Tuesday, March 18

## 2012-12-29 NOTE — Interval H&P Note (Signed)
History and Physical Interval Note:  12/29/2012 1:30 PM  [name]  has presented today for surgery, with the diagnosis of pvd  The various methods of treatment have been discussed with the patient and family. After consideration of risks, benefits and other options for treatment, the patient has consented to  Procedure(s): ABDOMINAL AORTAGRAM (N/A) as a surgical intervention .  The patient's history has been reviewed, patient examined, no change in status, stable for surgery.  I have reviewed the patient's chart and labs.  Questions were answered to the patient's satisfaction.     Jefrey Raburn IV, V. [facility]

## 2012-12-30 ENCOUNTER — Telehealth: Payer: Self-pay | Admitting: Vascular Surgery

## 2012-12-30 ENCOUNTER — Other Ambulatory Visit: Payer: Self-pay | Admitting: *Deleted

## 2012-12-30 DIAGNOSIS — Z48812 Encounter for surgical aftercare following surgery on the circulatory system: Secondary | ICD-10-CM

## 2012-12-30 DIAGNOSIS — I739 Peripheral vascular disease, unspecified: Secondary | ICD-10-CM

## 2012-12-30 NOTE — Telephone Encounter (Addendum)
Message copied by Fredrich Birks on Wed Dec 30, 2012 11:53 AM ------      Message from: Merri Ray A      Created: Wed Dec 30, 2012 11:07 AM      Regarding: schedule                   ----- Message -----         From: Melene Plan, RN         Sent: 12/30/2012  10:19 AM           To: Vallarie Mare, LPN, [facility]                        ----- Message -----         From: Nada Libman, MD         Sent: 12/29/2012   3:44 PM           To: Reuel Derby, Melene Plan, RN            12/29/2012:            Procedure Performed:       1.  ultrasound-guided access, left femoral artery       2.  abdominal aortogram       3.  bilateral lower extremity runoff                  Schedule the patient to come back to visit Dr. Hart Rochester in 6 months with a duplex ultrasound of both lower extremity       ------   12/30/12: sent letter to home address, dpm

## 2013-05-20 ENCOUNTER — Other Ambulatory Visit: Payer: Self-pay | Admitting: *Deleted

## 2013-05-20 DIAGNOSIS — I739 Peripheral vascular disease, unspecified: Secondary | ICD-10-CM

## 2013-05-21 ENCOUNTER — Encounter (INDEPENDENT_AMBULATORY_CARE_PROVIDER_SITE_OTHER): Payer: Medicare HMO | Admitting: *Deleted

## 2013-05-21 ENCOUNTER — Ambulatory Visit: Payer: Medicare Other | Admitting: Neurosurgery

## 2013-05-21 DIAGNOSIS — I739 Peripheral vascular disease, unspecified: Secondary | ICD-10-CM

## 2013-05-21 DIAGNOSIS — Z48812 Encounter for surgical aftercare following surgery on the circulatory system: Secondary | ICD-10-CM

## 2013-05-24 ENCOUNTER — Other Ambulatory Visit: Payer: Self-pay | Admitting: *Deleted

## 2013-05-24 DIAGNOSIS — I70219 Atherosclerosis of native arteries of extremities with intermittent claudication, unspecified extremity: Secondary | ICD-10-CM

## 2013-05-24 DIAGNOSIS — Z48812 Encounter for surgical aftercare following surgery on the circulatory system: Secondary | ICD-10-CM

## 2013-05-31 ENCOUNTER — Encounter: Payer: Self-pay | Admitting: Vascular Surgery

## 2013-07-06 ENCOUNTER — Ambulatory Visit: Payer: Medicare HMO | Admitting: Vascular Surgery

## 2013-07-10 ENCOUNTER — Other Ambulatory Visit: Payer: Self-pay | Admitting: Cardiology

## 2013-07-10 DIAGNOSIS — E7800 Pure hypercholesterolemia, unspecified: Secondary | ICD-10-CM

## 2013-07-10 DIAGNOSIS — Z79899 Other long term (current) drug therapy: Secondary | ICD-10-CM

## 2013-07-27 ENCOUNTER — Other Ambulatory Visit (INDEPENDENT_AMBULATORY_CARE_PROVIDER_SITE_OTHER): Payer: Medicare HMO

## 2013-07-27 DIAGNOSIS — Z79899 Other long term (current) drug therapy: Secondary | ICD-10-CM

## 2013-07-27 DIAGNOSIS — E7800 Pure hypercholesterolemia, unspecified: Secondary | ICD-10-CM

## 2013-07-27 LAB — ALT: ALT: 19 U/L (ref 0–53)

## 2013-07-28 NOTE — Progress Notes (Signed)
Printed and mailed to pt to make aware.  

## 2013-08-04 ENCOUNTER — Other Ambulatory Visit: Payer: Self-pay | Admitting: Cardiology

## 2013-08-20 ENCOUNTER — Encounter: Payer: Self-pay | Admitting: Cardiology

## 2013-09-15 ENCOUNTER — Ambulatory Visit (INDEPENDENT_AMBULATORY_CARE_PROVIDER_SITE_OTHER): Payer: Commercial Managed Care - HMO | Admitting: Cardiology

## 2013-09-15 ENCOUNTER — Encounter: Payer: Self-pay | Admitting: Cardiology

## 2013-09-15 VITALS — BP 140/82 | HR 98 | Ht 64.0 in | Wt 208.0 lb

## 2013-09-15 DIAGNOSIS — E785 Hyperlipidemia, unspecified: Secondary | ICD-10-CM

## 2013-09-15 DIAGNOSIS — I2510 Atherosclerotic heart disease of native coronary artery without angina pectoris: Secondary | ICD-10-CM

## 2013-09-15 DIAGNOSIS — E669 Obesity, unspecified: Secondary | ICD-10-CM

## 2013-09-15 DIAGNOSIS — I10 Essential (primary) hypertension: Secondary | ICD-10-CM

## 2013-09-15 DIAGNOSIS — I5032 Chronic diastolic (congestive) heart failure: Secondary | ICD-10-CM

## 2013-09-15 DIAGNOSIS — G4733 Obstructive sleep apnea (adult) (pediatric): Secondary | ICD-10-CM | POA: Insufficient documentation

## 2013-09-15 NOTE — Progress Notes (Signed)
[address] [place], [place]  [address] Phone: [phone] Fax:  [phone]  Date:  09/15/2013   ID:  NASIM HABEEB, DOB 1930-09-24, MRN [id number]  PCP:  Lupita Raider, MD  Cardiologist:  Armanda Magic, MD  Sleep Medicine:  Armanda Magic, MD   History of Present Illness: BRIANNA ESSON is a 79 y.o. male with a history of ASCAD, HTN, dyslipidemia, OSA on CPAP, chronic diastolic CHF and obesity who presents today for followup.  He is doing well.  He denies any chest pain, dizziness, palpitations, LE edema or syncope.  He has chronic SOB which is stable.  He complains of achiness in both of his arms and shoulders.   Wt Readings from Last 3 Encounters:  09/15/13 208 lb (94.348 kg)  12/29/12 190 lb (86.183 kg)  12/29/12 190 lb (86.183 kg)     Past Medical History  Diagnosis Date  . Diabetes mellitus   . Asthma   . Hypertension   . Hypercholesterolemia   . Hypothyroidism   . Anxiety   . COPD (chronic obstructive pulmonary disease)   . GERD (gastroesophageal reflux disease)   . Peripheral vascular disease 11/2006    s/p left stent  . Shortness of breath   . DVT (deep venous thrombosis)   . Diverticulitis Oct. 2013    bleeding in the past and Effient for his CAD was stopped  . Coronary artery disease     s/p PCI of RCA 03/2009 at which time there was 70% ramus and 90% RCA, cath 01/2011 showed patent stents in RCA with moderate prox disesae, aneurysmal left circ and small 90% ramus s/p PCI, patent LAD EF 55%  . Chronic diastolic CHF (congestive heart failure)     diastolic   . OSA (obstructive sleep apnea)     severe on CPAP  . Obesity (BMI 30-39.9)     Current Outpatient Prescriptions  Medication Sig Dispense Refill  . albuterol (PROVENTIL HFA;VENTOLIN HFA) 108 (90 BASE) MCG/ACT inhaler Inhale 2 puffs into the lungs every 6 (six) hours as needed for shortness of breath.       Marland Kitchen aspirin EC 81 MG tablet Take 81 mg by mouth daily.      . budesonide-formoterol  (SYMBICORT) 80-4.5 MCG/ACT inhaler Inhale 2 puffs into the lungs daily as needed. FOR WHEEZING      . diazepam (VALIUM) 5 MG tablet He takes only when having procedures done for anxiety.      Marland Kitchen ezetimibe-simvastatin (VYTORIN) 10-80 MG per tablet Take 1 tablet by mouth at bedtime.        Marland Kitchen glimepiride (AMARYL) 2 MG tablet Take 2 mg by mouth daily.        . isosorbide mononitrate (IMDUR) 60 MG 24 hr tablet TAKE 1 TABLET BY MOUTH EVERY DAY  30 tablet  6  . latanoprost (XALATAN) 0.005 % ophthalmic solution Place 1 drop into both eyes at bedtime.      Marland Kitchen levothyroxine (SYNTHROID, LEVOTHROID) 175 MCG tablet Take 175 mcg by mouth daily before breakfast.       . loperamide (IMODIUM A-D) 2 MG tablet Take 2 mg by mouth 4 (four) times daily as needed. For diarrhea.       . metFORMIN (GLUCOPHAGE) 500 MG tablet Take 500 mg by mouth 2 (two) times daily.       . metoprolol tartrate (LOPRESSOR) 25 MG tablet Take 25 mg by mouth 2 (two) times daily.       . nitroGLYCERIN (  NITROSTAT) 0.4 MG SL tablet Place 0.4 mg under the tongue every 5 (five) minutes x 3 doses as needed. For chest pain.      . NON FORMULARY Sleeps with CPAP      . omeprazole (PRILOSEC) 20 MG capsule Take 1 capsule by mouth 2 (two) times daily.       No current facility-administered medications for this visit.    Allergies:    Allergies  Allergen Reactions  . Clopidogrel Bisulfate Itching    Social History:  The patient  reports that he quit smoking about 54 years ago. His smoking use included Cigarettes and Cigars. He smoked 0.00 packs per day for 15 years. He has never used smokeless tobacco. He reports that he does not drink alcohol or use illicit drugs.   Family History:  The patient's family history includes Cancer in his sister; Diabetes in his brother, mother, and sister; Heart attack in his mother and sister; Heart disease in his sister; Hyperlipidemia in his mother, sister, and sister; Hypertension in his mother, sister, and sister.  There is no history of Malignant hyperthermia.   ROS:  Please see the history of present illness.      All other systems reviewed and negative.   PHYSICAL EXAM: VS:  BP 140/82  Pulse 98  Ht 5\' 4"  (1.626 m)  Wt 208 lb (94.348 kg)  BMI 35.69 kg/m2  SpO2 95% Well nourished, well developed, in no acute distress HEENT: normal Neck: no JVD Cardiac:  normal S1, S2; RRR; no murmur Lungs:  clear to auscultation bilaterally, no wheezing, rhonchi or rales Abd: soft, nontender, no hepatomegaly Ext: no edema Skin: warm and dry Neuro:  CNs 2-12 intact, no focal abnormalities noted      ASSESSMENT AND PLAN:  1. ASCAD with no angina  - continue ASA/Imdur 2. HTN - well controlled  - continue metoprolol 3. Dyslipidemia - he is having achiness in the muscles in his upper body so I have asked him to hold his simvastatin for 2 weeks and call and let me know if achiness improves 4. OSA on CPAP  - I will get him in with a new DME since his insurance changed.  He will need a new mask and tubing.  I will also get a download. 5. Obesity 6. Chronic diastolic CHF - appears euvolemic  - continue beta blocker  Followup with me in 6 months  Signed, Armanda Magic, MD 09/15/2013 1:56 PM

## 2013-09-15 NOTE — Patient Instructions (Signed)
Your physician has recommended you make the following change in your medication: Holy Vytorin for 2 weeks and see if that helps with muscle pain. Call us and let us know.   We will be setting you up with [facility] as your new DME for supplies for your CPAP machine. We will also be ordering a airfot N10 mask and tubing and get a download of CPAP machine as well.   Your physician wants you to follow-up in: 6 Weeks with Dr Sherlyn Lick will receive a reminder letter in the mail two months in advance. If you don't receive a letter, please call our office to schedule the follow-up appointment.

## 2013-09-17 ENCOUNTER — Ambulatory Visit: Payer: Medicare HMO | Admitting: Cardiology

## 2013-09-27 ENCOUNTER — Telehealth: Payer: Self-pay | Admitting: Cardiology

## 2013-09-27 DIAGNOSIS — G4733 Obstructive sleep apnea (adult) (pediatric): Secondary | ICD-10-CM

## 2013-09-27 NOTE — Telephone Encounter (Signed)
New Message  Pt wife called states that the pt has been without oxygen for 4 days being that his insurance with [employer] did not cover the oxygen tank.. Please create a new script and have it sent to [facility]. Please assist.

## 2013-09-27 NOTE — Telephone Encounter (Signed)
To Dr. Mayford Knife to advise. Did we prescribe oxygen for pt?

## 2013-09-27 NOTE — Telephone Encounter (Signed)
Is this O2 to use with his CPAP or does he use it during the day

## 2013-09-28 NOTE — Telephone Encounter (Signed)
Please get a copy of his last overnight pulse oximetry report

## 2013-09-28 NOTE — Telephone Encounter (Signed)
lmtrc

## 2013-09-28 NOTE — Telephone Encounter (Addendum)
Spoke with pts daughter and he was on O2 was cpap through [place], but now pt is with [facility]. He needs the oxygen with his cpap.

## 2013-09-29 NOTE — Telephone Encounter (Signed)
I called [facility] and [facility] and they have no records of a overnight oximetry. I did get a copy of a medical necessity form Dr Mayford Knife signed.

## 2013-09-29 NOTE — Telephone Encounter (Signed)
Called [facility] and they are faxing over the report for [place] now.

## 2013-09-30 NOTE — Telephone Encounter (Signed)
Pt aware. Ordered for pt.

## 2013-09-30 NOTE — Telephone Encounter (Signed)
Please order another overnight pulse oximetry on CPAP

## 2013-10-09 ENCOUNTER — Other Ambulatory Visit: Payer: Self-pay | Admitting: Cardiology

## 2013-10-15 ENCOUNTER — Telehealth: Payer: Self-pay | Admitting: Cardiology

## 2013-10-15 DIAGNOSIS — E785 Hyperlipidemia, unspecified: Secondary | ICD-10-CM

## 2013-10-15 NOTE — Telephone Encounter (Signed)
Pt was supposed to call two weeks after last OV to let us know if his myalgias have gotten better with him holding his Simvastatin. Pt called today to say he is in a lot less pain since then. To Dr Radford Pax to advise.

## 2013-10-15 NOTE — Telephone Encounter (Signed)
New message      Pt daughter says the med change did make a diff he is in less pain. Wants return phone call for follow up instructions

## 2013-10-16 NOTE — Telephone Encounter (Signed)
Please eval lipid management since pain improved after stopping statin

## 2013-10-18 ENCOUNTER — Encounter: Payer: Self-pay | Admitting: General Surgery

## 2013-10-18 ENCOUNTER — Telehealth: Payer: Self-pay | Admitting: Cardiology

## 2013-10-18 MED ORDER — EZETIMIBE-SIMVASTATIN 10-20 MG PO TABS: 1.0000 | ORAL_TABLET | Freq: Every day | ORAL | Status: DC

## 2013-10-18 NOTE — Telephone Encounter (Signed)
Patient was on Vytorin 10/80 mg qd for a long time without difficulty.  Typically the myalgias are dose related, and since she was on Vytorin 10/80 mg daily, I would like for her to restart vytorin but

## 2013-10-18 NOTE — Telephone Encounter (Signed)
Spoke with Arbie Cookey at [facility] and explained pt does have CPAP machine he just does not have the oxygen any more like he did before. This is the reason we are doing the Overnight oximety due to dhow teh medical necessity for the oxygen for pt.

## 2013-10-18 NOTE — Telephone Encounter (Signed)
Patient was on Vytorin 10/80 mg qd for a long time without difficulty. Typically the myalgias are dose related, and since she was on Vytorin 10/80 mg daily, I would like for her to restart vytorin but at a lower dose.  Have her restart Vytorin at just 10/20 mg daily.  Recheck lipid panel and hepatic panel 3 months later.  Have her call back if muscle aches restart after taking this lower dose of vytorin.  Okay to start her on samples or a voucher for a free month at the pharmacy.  Please notify patient, update meds, and set up labs. Thanks.

## 2013-10-18 NOTE — Telephone Encounter (Signed)
New message     They have an order for an overnight cpap---but pt does not have a cpap machine

## 2013-10-18 NOTE — Telephone Encounter (Signed)
Pt is aware.  

## 2013-10-18 NOTE — Telephone Encounter (Signed)
[name] with [facility] is returning your call, please call [phone]

## 2013-10-18 NOTE — Telephone Encounter (Signed)
Spoke with [name] adn they are sending a therapist over to his place tomorrow to check on CPAP and set up Over night oximetry

## 2013-10-27 ENCOUNTER — Ambulatory Visit: Payer: Medicare HMO | Admitting: Cardiology

## 2013-11-22 ENCOUNTER — Emergency Department (HOSPITAL_COMMUNITY): Payer: Medicare PPO

## 2013-11-22 ENCOUNTER — Emergency Department (HOSPITAL_COMMUNITY)
Admission: EM | Admit: 2013-11-22 | Discharge: 2013-11-22 | Disposition: A | Discharge status: Home / Self Care | Payer: Medicare PPO | Attending: Emergency Medicine | Admitting: Emergency Medicine

## 2013-11-22 ENCOUNTER — Encounter (HOSPITAL_COMMUNITY): Payer: Self-pay | Admitting: Emergency Medicine

## 2013-11-22 DIAGNOSIS — Z9981 Dependence on supplemental oxygen: Secondary | ICD-10-CM | POA: Insufficient documentation

## 2013-11-22 DIAGNOSIS — E039 Hypothyroidism, unspecified: Secondary | ICD-10-CM | POA: Insufficient documentation

## 2013-11-22 DIAGNOSIS — Z86718 Personal history of other venous thrombosis and embolism: Secondary | ICD-10-CM | POA: Insufficient documentation

## 2013-11-22 DIAGNOSIS — K219 Gastro-esophageal reflux disease without esophagitis: Secondary | ICD-10-CM | POA: Insufficient documentation

## 2013-11-22 DIAGNOSIS — Z9861 Coronary angioplasty status: Secondary | ICD-10-CM | POA: Insufficient documentation

## 2013-11-22 DIAGNOSIS — Z7982 Long term (current) use of aspirin: Secondary | ICD-10-CM | POA: Insufficient documentation

## 2013-11-22 DIAGNOSIS — G4730 Sleep apnea, unspecified: Secondary | ICD-10-CM | POA: Insufficient documentation

## 2013-11-22 DIAGNOSIS — Z792 Long term (current) use of antibiotics: Secondary | ICD-10-CM | POA: Insufficient documentation

## 2013-11-22 DIAGNOSIS — E7800 Pure hypercholesterolemia, unspecified: Secondary | ICD-10-CM | POA: Insufficient documentation

## 2013-11-22 DIAGNOSIS — G4733 Obstructive sleep apnea (adult) (pediatric): Secondary | ICD-10-CM | POA: Insufficient documentation

## 2013-11-22 DIAGNOSIS — Z87891 Personal history of nicotine dependence: Secondary | ICD-10-CM | POA: Insufficient documentation

## 2013-11-22 DIAGNOSIS — J449 Chronic obstructive pulmonary disease, unspecified: Secondary | ICD-10-CM | POA: Insufficient documentation

## 2013-11-22 DIAGNOSIS — Z79899 Other long term (current) drug therapy: Secondary | ICD-10-CM | POA: Insufficient documentation

## 2013-11-22 DIAGNOSIS — IMO0002 Reserved for concepts with insufficient information to code with codable children: Secondary | ICD-10-CM | POA: Insufficient documentation

## 2013-11-22 DIAGNOSIS — I2510 Atherosclerotic heart disease of native coronary artery without angina pectoris: Secondary | ICD-10-CM | POA: Insufficient documentation

## 2013-11-22 DIAGNOSIS — F411 Generalized anxiety disorder: Secondary | ICD-10-CM | POA: Insufficient documentation

## 2013-11-22 DIAGNOSIS — E669 Obesity, unspecified: Secondary | ICD-10-CM | POA: Insufficient documentation

## 2013-11-22 DIAGNOSIS — J40 Bronchitis, not specified as acute or chronic: Secondary | ICD-10-CM

## 2013-11-22 DIAGNOSIS — Z9889 Other specified postprocedural states: Secondary | ICD-10-CM | POA: Insufficient documentation

## 2013-11-22 DIAGNOSIS — I5032 Chronic diastolic (congestive) heart failure: Secondary | ICD-10-CM | POA: Insufficient documentation

## 2013-11-22 DIAGNOSIS — E119 Type 2 diabetes mellitus without complications: Secondary | ICD-10-CM | POA: Insufficient documentation

## 2013-11-22 DIAGNOSIS — I10 Essential (primary) hypertension: Secondary | ICD-10-CM | POA: Insufficient documentation

## 2013-11-22 DIAGNOSIS — J4489 Other specified chronic obstructive pulmonary disease: Secondary | ICD-10-CM | POA: Insufficient documentation

## 2013-11-22 LAB — BASIC METABOLIC PANEL
BUN: 10 mg/dL (ref 6–23)
CO2: 28 mEq/L (ref 19–32)
CREATININE: 0.95 mg/dL (ref 0.50–1.35)
Calcium: 9.5 mg/dL (ref 8.4–10.5)
Chloride: 101 mEq/L (ref 96–112)
GFR calc Af Amer: 88 mL/min — ABNORMAL LOW (ref >90)
GFR, EST NON AFRICAN AMERICAN: 76 mL/min — AB (ref >90)
GLUCOSE: 80 mg/dL (ref 70–99)
POTASSIUM: 4 meq/L (ref 3.7–5.3)
Sodium: 140 mEq/L (ref 137–147)

## 2013-11-22 LAB — CBC
HCT: 39.5 % (ref 39.0–52.0)
HEMOGLOBIN: 13 g/dL (ref 13.0–17.0)
MCH: 28.8 pg (ref 26.0–34.0)
MCHC: 32.9 g/dL (ref 30.0–36.0)
MCV: 87.4 fL (ref 78.0–100.0)
Platelets: 163 10*3/uL (ref 150–400)
RBC: 4.52 MIL/uL (ref 4.22–5.81)
RDW: 14.1 % (ref 11.5–15.5)
WBC: 5.9 10*3/uL (ref 4.0–10.5)

## 2013-11-22 MED ORDER — MOXIFLOXACIN HCL 400 MG PO TABS: 400.0000 mg | ORAL_TABLET | Freq: Every day | ORAL | Status: DC

## 2013-11-22 NOTE — Discharge Instructions (Signed)
Take the antibiotic, as directed Use your albuterol inhaler 2 puffs 3 times a day to help with the cough    Bronchitis Bronchitis is inflammation of the airways that extend from the windpipe into the lungs (bronchi). The inflammation often causes mucus to develop, which leads to a cough. If the inflammation becomes severe, it may cause shortness of breath. CAUSES  Bronchitis may be caused by:   Viral infections.   Bacteria.   Cigarette smoke.   Allergens, pollutants, and other irritants.  SIGNS AND SYMPTOMS  The most common symptom of bronchitis is a frequent cough that produces mucus. Other symptoms include:  Fever.   Body aches.   Chest congestion.   Chills.   Shortness of breath.   Sore throat.  DIAGNOSIS  Bronchitis is usually diagnosed through a medical history and physical exam. Tests, such as chest X-rays, are sometimes done to rule out other conditions.  TREATMENT  You may need to avoid contact with whatever caused the problem (smoking, for example). Medicines are sometimes needed. These may include:  Antibiotics. These may be prescribed if the condition is caused by bacteria.  Cough suppressants. These may be prescribed for relief of cough symptoms.   Inhaled medicines. These may be prescribed to help open your airways and make it easier for you to breathe.   Steroid medicines. These may be prescribed for those with recurrent (chronic) bronchitis. HOME CARE INSTRUCTIONS  Get plenty of rest.   Drink enough fluids to keep your urine clear or pale yellow (unless you have a medical condition that requires fluid restriction). Increasing fluids may help thin your secretions and will prevent dehydration.   Only take over-the-counter or prescription medicines as directed by your health care provider.  Only take antibiotics as directed. Make sure you finish them even if you start to feel better.  Avoid secondhand smoke, irritating chemicals, and  strong fumes. These will make bronchitis worse. If you are a smoker, quit smoking. Consider using nicotine gum or skin patches to help control withdrawal symptoms. Quitting smoking will help your lungs heal faster.   Put a cool-mist humidifier in your bedroom at night to moisten the air. This may help loosen mucus. Change the water in the humidifier daily. You can also run the hot water in your shower and sit in the bathroom with the door closed for 5 10 minutes.   Follow up with your health care provider as directed.   Wash your hands frequently to avoid catching bronchitis again or spreading an infection to others.  SEEK MEDICAL CARE IF: Your symptoms do not improve after 1 week of treatment.  SEEK IMMEDIATE MEDICAL CARE IF:  Your fever increases.  You have chills.   You have chest pain.   You have worsening shortness of breath.   You have bloody sputum.  You faint.  You have lightheadedness.  You have a severe headache.   You vomit repeatedly. MAKE SURE YOU:   Understand these instructions.  Will watch your condition.  Will get help right away if you are not doing well or get worse. Document Released: 09/30/2005 Document Revised: 07/21/2013 Document Reviewed: 05/25/2013 [facility] Patient Information 2014 [place].

## 2013-11-22 NOTE — ED Provider Notes (Signed)
CSN: [id number]     Arrival date & time 11/22/13  1140 History   First MD Initiated Contact with Patient 11/22/13 1450     Chief Complaint  Patient presents with  . Cough     (Consider location/radiation/quality/duration/timing/severity/associated sxs/prior Treatment) Patient is a 80 y.o. male presenting with cough. The history is provided by the patient.  Cough  he complains of a cough for one month. He feels like he has mucus to get out, but is unable to get much up. He is occasionally is able to cough up some gray-colored sputum. He denies fever, chills, nausea, vomiting, weakness, or dizziness. During this time. He has been treated by his primary care doctor with a course of prednisone, and by increasing his Symbicort inhaler dose. He has an albuterol inhaler, but has not used it. He called his doctor several days ago, and she advised she come to the emergency department if he is not getting any better. That is the reason he came here today. He is using his medications as directed. There are no other known modifying factors.  Past Medical History  Diagnosis Date  . Diabetes mellitus   . Asthma   . Hypertension   . Hypercholesterolemia   . Hypothyroidism   . Anxiety   . COPD (chronic obstructive pulmonary disease)   . GERD (gastroesophageal reflux disease)   . Sleep apnea     severe OSA awaiting CPAP titration  . Peripheral vascular disease 11/2006    s/p left stent  . Shortness of breath   . DVT (deep venous thrombosis)   . Diverticulitis Oct. 2013    bleeding in the past and Effient for his CAD was stopped  . Coronary artery disease     s/p PCI of RCA 03/2009 at which time there was 70% ramus and 90% RCA, cath 01/2011 showed patent stents in RCA with moderate prox disesae, aneurysmal left circ and small 90% ramus s/p PCI, patent LAD EF 55%  . Chronic diastolic CHF (congestive heart failure)     diastolic   . OSA (obstructive sleep apnea)     severe on CPAP  . Obesity (BMI  30-39.9)    Past Surgical History  Procedure Laterality Date  . Coronary stent placement  2010 and 2012  . Hernia repair  1992    evntral hernia repair  . Esophagogastroduodenoscopy  12/26/2011    Procedure: ESOPHAGOGASTRODUODENOSCOPY (EGD);  Surgeon: Garlan Fair, MD;  Location: [facility];  Service: Endoscopy;  Laterality: N/A;  . Balloon dilation  12/26/2011    Procedure: BALLOON DILATION;  Surgeon: Garlan Fair, MD;  Location: [facility];  Service: Endoscopy;  Laterality: N/A;  . Esophagogastroduodenoscopy  08/13/2012    Procedure: ESOPHAGOGASTRODUODENOSCOPY (EGD);  Surgeon: Arta Silence, MD;  Location: [facility];  Service: Endoscopy;  Laterality: Left;  . Colonoscopy  08/14/2012    Procedure: COLONOSCOPY;  Surgeon: Arta Silence, MD;  Location: [facility];  Service: Endoscopy;  Laterality: N/A;   Family History  Problem Relation Age of Onset  . Malignant hyperthermia Neg Hx   . Diabetes Mother   . Hyperlipidemia Mother   . Hypertension Mother   . Heart attack Mother   . Cancer Sister   . Diabetes Sister   . Hyperlipidemia Sister   . Hypertension Sister   . Heart attack Sister   . Hyperlipidemia Sister   . Hypertension Sister   . Heart disease Sister     Heart Disease before age 53  . Diabetes  Brother    History  Substance Use Topics  . Smoking status: Former Smoker -- 15 years    Types: Cigarettes, Cigars    Quit date: 10/14/1958  . Smokeless tobacco: Never Used  . Alcohol Use: No    Review of Systems  Respiratory: Positive for cough.   All other systems reviewed and are negative.      Allergies  Clopidogrel bisulfate  Home Medications   Current Outpatient Rx  Name  Route  Sig  Dispense  Refill  . albuterol (PROVENTIL HFA;VENTOLIN HFA) 108 (90 BASE) MCG/ACT inhaler   Inhalation   Inhale 2 puffs into the lungs every 6 (six) hours as needed for shortness of breath.          Marland Kitchen aspirin EC 81 MG tablet   Oral   Take 81 mg by mouth  daily.         . budesonide-formoterol (SYMBICORT) 80-4.5 MCG/ACT inhaler   Inhalation   Inhale 2 puffs into the lungs daily as needed. FOR WHEEZING         . ezetimibe-simvastatin (VYTORIN) 10-20 MG per tablet   Oral   Take 1 tablet by mouth daily.   30 tablet   11   . glimepiride (AMARYL) 2 MG tablet   Oral   Take 2 mg by mouth daily.           . isosorbide mononitrate (IMDUR) 60 MG 24 hr tablet   Oral   Take 60 mg by mouth daily.         Marland Kitchen latanoprost (XALATAN) 0.005 % ophthalmic solution   Both Eyes   Place 1 drop into both eyes at bedtime.         Marland Kitchen levothyroxine (SYNTHROID, LEVOTHROID) 175 MCG tablet   Oral   Take 175 mcg by mouth daily before breakfast.          . loperamide (IMODIUM A-D) 2 MG tablet   Oral   Take 2 mg by mouth 4 (four) times daily as needed. For diarrhea.          . metFORMIN (GLUCOPHAGE) 500 MG tablet   Oral   Take 500 mg by mouth 2 (two) times daily.          . metoprolol tartrate (LOPRESSOR) 25 MG tablet   Oral   Take 25 mg by mouth daily.          . nitroGLYCERIN (NITROSTAT) 0.4 MG SL tablet   Sublingual   Place 0.4 mg under the tongue every 5 (five) minutes x 3 doses as needed. For chest pain.         . NON FORMULARY      Sleeps with CPAP         . omeprazole (PRILOSEC) 20 MG capsule   Oral   Take 20 mg by mouth daily.         . diazepam (VALIUM) 5 MG tablet      He takes only when having procedures done for anxiety.         . moxifloxacin (AVELOX) 400 MG tablet   Oral   Take 1 tablet (400 mg total) by mouth daily at 8 pm.   7 tablet   0    BP 169/99  Pulse 63  Temp(Src) 98.7 F (37.1 C) (Oral)  Resp 20  SpO2 93% Physical Exam  Nursing note and vitals reviewed. Constitutional: He is oriented to person, place, and time. He appears well-developed and well-nourished.  Appears  younger than stated age  HENT:  Head: Normocephalic and atraumatic.  Right Ear: External ear normal.  Left Ear:  External ear normal.  Eyes: Conjunctivae and EOM are normal. Pupils are equal, round, and reactive to light.  Neck: Normal range of motion and phonation normal. Neck supple.  Cardiovascular: Normal rate, regular rhythm, normal heart sounds and intact distal pulses.   Pulmonary/Chest: Effort normal and breath sounds normal. No respiratory distress. He has no wheezes. He has no rales. He exhibits no tenderness and no bony tenderness.  Abdominal: Soft. Normal appearance. There is no tenderness.  Musculoskeletal: Normal range of motion.  Neurological: He is alert and oriented to person, place, and time. No cranial nerve deficit or sensory deficit. He exhibits normal muscle tone. Coordination normal.  Normal gait  Skin: Skin is warm, dry and intact.  Psychiatric: He has a normal mood and affect. His behavior is normal. Judgment and thought content normal.    ED Course  Procedures (including critical care time)  Medications - No data to display  Patient Vitals for the past 24 hrs:  BP Temp Temp src Pulse Resp SpO2  11/22/13 1519 169/99 mmHg 98.7 F (37.1 C) Oral 63 20 93 %  11/22/13 1212 130/63 mmHg 98.6 F (37 C) - - 16 93 %    The findings were discussed with the pt and his daughter. All questions answered.  Labs Review Labs Reviewed  BASIC METABOLIC PANEL - Abnormal; Notable for the following:    GFR calc non Af Amer 76 (*)    GFR calc Af Amer 88 (*)    All other components within normal limits  CBC   Imaging Review Dg Chest 2 View (if Patient Has Fever And/or Copd)  11/22/2013   CLINICAL DATA:  Chest congestion. Productive cough. Short of breath.  EXAM: CHEST  2 VIEW  COMPARISON:  08/12/2012  FINDINGS: Cardiac silhouette is normal in size. Mediastinum and hilar contours are unremarkable.  Lungs are mildly hyperexpanded. It is a small nodular density in the left mid lung that is stable. No lung consolidation. No pulmonary edema. No pleural effusion or pneumothorax.  The bony thorax  is demineralized but intact.  IMPRESSION: No acute cardiopulmonary disease.   Electronically Signed   By: Lajean Manes M.D.   On: 11/22/2013 12:49    EKG Interpretation   None       MDM   Final diagnoses:  Bronchitis    Nonspecific sx c/w bronchitis. Doubt PNE, CHF, or metabolic instability.  Nursing Notes Reviewed/ Care Coordinated Applicable Imaging Reviewed Interpretation of Laboratory Data incorporated into ED treatment  The patient appears reasonably screened and/or stabilized for discharge and I doubt any other medical condition or other [facility] requiring further screening, evaluation, or treatment in the ED at this time prior to discharge.  Plan: Home Medications- Avelox, Albuterol; Home Treatments- rest; return here if the recommended treatment, does not improve the symptoms; Recommended follow up- PCP for check up in 1 week    Richarda Blade, MD 11/22/13 1539

## 2013-11-22 NOTE — ED Notes (Signed)
Pt states he has had cough and cold like symptoms for past month. Pt has hx of copd, pcp sent here to rule out pnuemonia.

## 2013-12-03 ENCOUNTER — Encounter: Payer: Self-pay | Admitting: Cardiology

## 2013-12-17 ENCOUNTER — Institutional Professional Consult (permissible substitution): Payer: Medicare HMO | Admitting: Internal Medicine

## 2013-12-27 ENCOUNTER — Encounter: Payer: Self-pay | Admitting: Internal Medicine

## 2013-12-27 ENCOUNTER — Ambulatory Visit (INDEPENDENT_AMBULATORY_CARE_PROVIDER_SITE_OTHER): Payer: Medicare HMO | Admitting: Internal Medicine

## 2013-12-27 VITALS — BP 122/76 | HR 65 | Temp 98.3°F | Ht 63.0 in | Wt 204.4 lb

## 2013-12-27 DIAGNOSIS — R0609 Other forms of dyspnea: Secondary | ICD-10-CM

## 2013-12-27 DIAGNOSIS — R0989 Other specified symptoms and signs involving the circulatory and respiratory systems: Principal | ICD-10-CM

## 2013-12-27 DIAGNOSIS — J449 Chronic obstructive pulmonary disease, unspecified: Secondary | ICD-10-CM

## 2013-12-27 NOTE — Assessment & Plan Note (Signed)
Partly related to obesity as well but not easily corrected - note baseline wt around 165 would be much better for his breathing, reviewed

## 2013-12-27 NOTE — Assessment & Plan Note (Addendum)
-   spirometry 12/27/2013  FEV1   0.97 (46%) ratio 52  DDX of  difficult airways managment all start with A and  include Adherence, Ace Inhibitors, Acid Reflux, Active Sinus Disease, Alpha 1 Antitripsin deficiency, Anxiety masquerading as Airways dz,  ABPA,  allergy(esp in young), Aspiration (esp in elderly), Adverse effects of DPI,  Active smokers, plus two Bs  = Bronchiectasis and Beta blocker use..and one C= CHF  Adherence is always the initial "prime suspect" and is a multilayered concern that requires a "trust but verify" approach in every patient - starting with knowing how to use medications, especially inhalers, correctly, keeping up with refills and understanding the fundamental difference between maintenance and prns vs those medications only taken for a very short course and then stopped and not refilled.  - was not using symbicort 160 as maint  2 bid at baseline - poor perception of chest symptoms - tightness would be an indication to go ahead and use the rescue saba -  The proper method of use, as well as anticipated side effects, of a metered-dose inhaler are discussed and demonstrated to the patient. Improved effectiveness after extensive coaching during this visit to a level of approximately  75%   ? Acid (or non-acid) GERD > always difficult to exclude as up to 75% of pts in some series report no assoc GI/ Heartburn symptoms> rec continue max (24h)  acid suppression   ? chf Lab Results  Component Value Date   PROBNP <30.0 01/05/2011   not likely as responded to saba so quickly and can probably do so in future if maintains mastery of hfa - otherwise may need to use neb saba as backup to saba hfa to prevent ER trips  Pulmonary f/u is prn

## 2013-12-27 NOTE — Patient Instructions (Addendum)
Symbicort 160 Take 2 puffs first thing in am and then another 2 puffs about 12 hours later but work on technique, smooth and deep   Only use your albuterol as a rescue medication to be used if you can't catch your breath by resting or doing a relaxed purse lip breathing pattern.  - The less you use it, the better it will work when you need it. - Ok to use up to 2 puffs  every 4 hours if you must but call for immediate appointment if use goes up over your usual need - Don't leave home without it !!  (think of it like the spare tire for your car)   Pulmonary follow up is as needed

## 2013-12-27 NOTE — Progress Notes (Signed)
   Subjective:    Patient ID: [name], male    DOB: Nov 09, 1929  MRN: [id number]  HPI  89 yobm quit smoking @ wt 165 lbs around 1965 referred 12/27/2013 by Dr [name] for copd eval.   12/27/2013 1st [facility] office visit/ [name]  Chief Complaint  Patient presents with  . Pulmonary Consult    Referred per Dr. Mayra Simon. Pt states dxed with COPD approx 3 yrs ago. He states that he developed a "cold" approx 1 month ago and symptoms were hard to get rid of. He c/o increased SOB for the past month. He gets SOB when he gets in a hurry.   baseline does ok if walking flat and slow and flat can still grocery shopping  And uses [facility] parking due to legs > sob. Then "worse cold in his life" x one month with trip ER> better p alb neb/avelox. ? Did you use your rescue inhaler before going to ER A: No, I thought I had pneumonia  ? Why was that A because my chest was so tight I couldn't cough anything up>  100% back to baseline.  Baseline rx symbicort and one-two in am and always 2 at hs / no need for emergency albuterol ever but hfa quite poor.  At ov obvious other patterns in day to day or daytime variabilty or assoc chronic cough or cp or chest tightness, subjective wheeze overt sinus or hb symptoms. No unusual exp hx or h/o childhood pna/ asthma or knowledge of premature birth.  Sleeping ok without nocturnal  or early am exacerbation  of respiratory  c/o's or need for noct saba. Also denies any obvious fluctuation of symptoms with weather or environmental changes or other aggravating or alleviating factors except as outlined above   Current Medications, Allergies, Complete Past Medical History, Past Surgical History, Family History, and Social History were reviewed in [employer] record.           Review of Systems  Constitutional: Negative for fever, chills, activity change, appetite change and unexpected weight change.  HENT: Positive for trouble swallowing.  Negative for congestion, dental problem, postnasal drip, rhinorrhea, sneezing, sore throat and voice change.   Eyes: Negative for visual disturbance.  Respiratory: Positive for shortness of breath. Negative for cough and choking.   Cardiovascular: Positive for leg swelling. Negative for chest pain.  Gastrointestinal: Negative for nausea, vomiting and abdominal pain.  Genitourinary: Negative for difficulty urinating.  Musculoskeletal: Positive for arthralgias.  Skin: Negative for rash.  Psychiatric/Behavioral: Negative for behavioral problems and confusion.       Objective:   Physical Exam   Hoarse obese amb  bm nad who prefers to use medical dxs abd terms to answer questions re symptoms  HEENT mild turbinate edema.  Oropharynx no thrush or excess pnd or cobblestoning.  No JVD or cervical adenopathy. Mild accessory muscle hypertrophy. Trachea midline, nl thryroid. Chest was hyperinflated by percussion with diminished breath sounds and moderate increased exp time without wheeze. Hoover sign positive at mid inspiration. Regular rate and rhythm without murmur gallop or rub or increase P2 or edema.  Abd: no hsm, nl excursion. Ext warm without cyanosis or clubbing.    cxr 11/22/13  No acute cardiopulmonary disease        Assessment & Plan:

## 2014-01-17 ENCOUNTER — Other Ambulatory Visit: Payer: Medicare HMO

## 2014-04-01 ENCOUNTER — Other Ambulatory Visit: Payer: Self-pay

## 2014-04-01 MED ORDER — ISOSORBIDE MONONITRATE ER 60 MG PO TB24: 60.0000 mg | ORAL_TABLET | Freq: Every day | ORAL | Status: DC

## 2014-04-04 ENCOUNTER — Encounter: Payer: Self-pay | Admitting: Cardiology

## 2014-04-06 ENCOUNTER — Ambulatory Visit: Payer: Commercial Managed Care - HMO | Admitting: Cardiology

## 2014-04-08 ENCOUNTER — Other Ambulatory Visit: Payer: Self-pay | Admitting: General Surgery

## 2014-04-08 DIAGNOSIS — E785 Hyperlipidemia, unspecified: Secondary | ICD-10-CM

## 2014-04-19 ENCOUNTER — Telehealth: Payer: Self-pay | Admitting: Cardiology

## 2014-04-19 ENCOUNTER — Other Ambulatory Visit: Payer: Self-pay | Admitting: General Surgery

## 2014-04-19 MED ORDER — EZETIMIBE-SIMVASTATIN 10-20 MG PO TABS: 0.5000 | ORAL_TABLET | Freq: Every day | ORAL | Status: DC

## 2014-04-19 NOTE — Telephone Encounter (Signed)
To Dr Turner to advise 

## 2014-04-19 NOTE — Telephone Encounter (Signed)
New message     Patients PCP--Dr Brigitte Pulse told pt to stop taking vytorin because his arm was hurting.  His arm stopped hurting.  She then told pt to begin taking 1/2 pill instead of 1 pill but to ask Dr Radford Pax if this is ok.  Pt want to know if it will be ok to take 1/2 pill of vytorin?

## 2014-04-19 NOTE — Telephone Encounter (Signed)
Yes that is fine

## 2014-04-19 NOTE — Telephone Encounter (Signed)
pts daughter aware and updated pts med list.

## 2014-05-31 ENCOUNTER — Encounter (HOSPITAL_COMMUNITY): Payer: Medicare HMO

## 2014-05-31 ENCOUNTER — Other Ambulatory Visit (HOSPITAL_COMMUNITY): Payer: Medicare HMO

## 2014-05-31 ENCOUNTER — Ambulatory Visit: Payer: Medicare HMO | Admitting: Family

## 2014-05-31 ENCOUNTER — Ambulatory Visit: Payer: Medicare HMO | Admitting: Vascular Surgery

## 2014-06-21 ENCOUNTER — Ambulatory Visit (INDEPENDENT_AMBULATORY_CARE_PROVIDER_SITE_OTHER): Payer: Commercial Managed Care - HMO | Admitting: Cardiology

## 2014-06-21 ENCOUNTER — Encounter: Payer: Self-pay | Admitting: Cardiology

## 2014-06-21 VITALS — BP 126/80 | HR 91 | Ht 64.0 in | Wt 210.2 lb

## 2014-06-21 DIAGNOSIS — Z8672 Personal history of thrombophlebitis: Secondary | ICD-10-CM

## 2014-06-21 DIAGNOSIS — E785 Hyperlipidemia, unspecified: Secondary | ICD-10-CM

## 2014-06-21 DIAGNOSIS — I70219 Atherosclerosis of native arteries of extremities with intermittent claudication, unspecified extremity: Secondary | ICD-10-CM

## 2014-06-21 DIAGNOSIS — I509 Heart failure, unspecified: Secondary | ICD-10-CM

## 2014-06-21 DIAGNOSIS — G4733 Obstructive sleep apnea (adult) (pediatric): Secondary | ICD-10-CM

## 2014-06-21 DIAGNOSIS — R609 Edema, unspecified: Secondary | ICD-10-CM

## 2014-06-21 DIAGNOSIS — I10 Essential (primary) hypertension: Secondary | ICD-10-CM

## 2014-06-21 DIAGNOSIS — I2510 Atherosclerotic heart disease of native coronary artery without angina pectoris: Secondary | ICD-10-CM

## 2014-06-21 DIAGNOSIS — I5032 Chronic diastolic (congestive) heart failure: Secondary | ICD-10-CM

## 2014-06-21 DIAGNOSIS — R600 Localized edema: Secondary | ICD-10-CM

## 2014-06-21 DIAGNOSIS — E669 Obesity, unspecified: Secondary | ICD-10-CM

## 2014-06-21 NOTE — Patient Instructions (Signed)
Your physician recommends that you continue on your current medications as directed. Please refer to the Current Medication list given to you today.  Your physician recommends that you return for a FASTING lipid profile and ALT within the next two weeks. Please schedule before leaving today  Your physician has requested that you have a lowerextremity venous duplex. This test is an ultrasound of the veins in the legs . It looks at venous blood flow that carries blood from the heart to the legs . Allow one hour for a Lower Venous exam.  There are no restrictions or special instructions.  Your physician wants you to follow-up in: 6 months with Dr Mallie Snooks will receive a reminder letter in the mail two months in advance. If you don't receive a letter, please call our office to schedule the follow-up appointment.

## 2014-06-21 NOTE — Progress Notes (Signed)
[facility], [address] [place], [place]  [address] Phone: [phone] Fax:  [phone]  Date:  06/21/2014   ID:  [name], DOB 1930-06-19, MRN [id number]  PCP:  [name], MD  Cardiologist:  Fransico Him, MD     History of Present Illness: [name] is a 80 y.o. male with a history of ASCAD, HTN, dyslipidemia, OSA on CPAP, chronic diastolic CHF and obesity who presents today for followup. He is doing well. He denies any dizziness, palpitations or syncope. He has chronic SOB which is stable. He rarely will have some mild CP that he thinks is from indigestion that is sharp and resolves with he belches. He has some chronic LLE edema which is the leg he had the DVT in the past.  He thinks the swelling is worse than in the past.  He has been having some pain in his LLE as well.  He tolerates the CPAP and mask well.  He feels the pressure is adequate.  He feels rested in the am and has no daytime sleepiness.    Wt Readings from Last 3 Encounters:  06/21/14 210 lb 3.2 oz (95.346 kg)  12/27/13 204 lb 6.4 oz (92.715 kg)  09/15/13 208 lb (94.348 kg)     Past Medical History  Diagnosis Date  . Diabetes mellitus   . Asthma   . Hypertension   . Hypercholesterolemia   . Hypothyroidism   . Anxiety   . COPD (chronic obstructive pulmonary disease)   . GERD (gastroesophageal reflux disease)   . Sleep apnea     severe OSA awaiting CPAP titration  . Peripheral vascular disease 11/2006    s/p left stent  . Shortness of breath   . DVT (deep venous thrombosis)   . Diverticulitis Oct. 2013    bleeding in the past and Effient for his CAD was stopped  . Coronary artery disease     s/p PCI of RCA 03/2009 at which time there was 70% ramus and 90% RCA, cath 01/2011 showed patent stents in RCA with moderate prox disesae, aneurysmal left circ and small 90% ramus s/p PCI, patent LAD EF 55%  . Chronic diastolic CHF (congestive heart failure)     diastolic   . OSA (obstructive sleep apnea)       severe on CPAP  . Obesity (BMI 30-39.9)     Current Outpatient Prescriptions  Medication Sig Dispense Refill  . albuterol (PROVENTIL HFA;VENTOLIN HFA) 108 (90 BASE) MCG/ACT inhaler Inhale 2 puffs into the lungs every 6 (six) hours as needed for shortness of breath.       Marland Kitchen aspirin EC 81 MG tablet Take 81 mg by mouth daily.      . budesonide-formoterol (SYMBICORT) 80-4.5 MCG/ACT inhaler Inhale 2 puffs into the lungs 2 (two) times daily as needed. FOR WHEEZING      . ezetimibe-simvastatin (VYTORIN) 10-20 MG per tablet Take 0.5 tablets by mouth daily.      Marland Kitchen glimepiride (AMARYL) 2 MG tablet Take 2 mg by mouth daily.        . isosorbide mononitrate (IMDUR) 60 MG 24 hr tablet Take 1 tablet (60 mg total) by mouth daily.  30 tablet  2  . latanoprost (XALATAN) 0.005 % ophthalmic solution Place 1 drop into both eyes as needed.       Marland Kitchen levothyroxine (SYNTHROID, LEVOTHROID) 175 MCG tablet Take 175 mcg by mouth daily before breakfast.       . loperamide (IMODIUM A-D) 2  MG tablet Take 2 mg by mouth 4 (four) times daily as needed. For diarrhea.       . metFORMIN (GLUCOPHAGE) 500 MG tablet Take 500 mg by mouth 2 (two) times daily.       . metoprolol tartrate (LOPRESSOR) 25 MG tablet Take 25 mg by mouth daily.       . nitroGLYCERIN (NITROSTAT) 0.4 MG SL tablet Place 0.4 mg under the tongue every 5 (five) minutes x 3 doses as needed. For chest pain.      . NON FORMULARY Sleeps with CPAP      . omeprazole (PRILOSEC) 20 MG capsule Take 20 mg by mouth daily.      . diazepam (VALIUM) 5 MG tablet He takes only when having procedures done for anxiety.       No current facility-administered medications for this visit.    Allergies:    Allergies  Allergen Reactions  . Clopidogrel Bisulfate Itching    Social History:  The patient  reports that he quit smoking about 50 years ago. His smoking use included Cigarettes and Cigars. He has a 7.5 pack-year smoking history. He has never used smokeless tobacco. He  reports that he does not drink alcohol or use illicit drugs.   Family History:  The patient's family history includes Asthma in his sister; Breast cancer in his sister; Diabetes in his brother, mother, and sister; Heart attack in his mother and sister; Heart disease in his sister; Hyperlipidemia in his mother, sister, and sister; Hypertension in his mother, sister, and sister. There is no history of Malignant hyperthermia.   ROS:  Please see the history of present illness.      All other systems reviewed and negative.   PHYSICAL EXAM: VS:  BP 126/80  Pulse 91  Ht 5\' 4"  (1.626 m)  Wt 210 lb 3.2 oz (95.346 kg)  BMI 36.06 kg/m2 Well nourished, well developed, in no acute distress HEENT: normal Neck: no JVD Cardiac:  normal S1, S2; RRR; no murmur Lungs:  clear to auscultation bilaterally, no wheezing, rhonchi or rales Abd: soft, nontender, no hepatomegaly Ext: trace edemaLLE Skin: warm and dry Neuro:  CNs 2-12 intact, no focal abnormalities noted  EKG;  NSR at 91bpm with no ST changes  ASSESSMENT AND PLAN:  1. ASCAD with no angina - continue ASA/Imdur  2. HTN - well controlled - continue metoprolol  3. Dyslipidemia - he was having achiness in the muscles in his upper body which has resolved on a lower dose of Vytorin - check FLP and ALT 4. OSA on CPAP -  I will get a download from his DME  5. Obesity 6. Chronic diastolic CHF - appears euvolemic - continue beta blocker       7.  COPD - followup with PCP        8.  Chronic DVT now with increased LE edema and pain - check LLE venous doppler to rule out DVT  Followup with me in 6 months       Signed, Fransico Him, MD 06/21/2014 3:49 PM

## 2014-07-05 ENCOUNTER — Ambulatory Visit (HOSPITAL_COMMUNITY): Payer: Medicare HMO | Attending: Cardiology | Admitting: Cardiology

## 2014-07-05 ENCOUNTER — Other Ambulatory Visit (INDEPENDENT_AMBULATORY_CARE_PROVIDER_SITE_OTHER): Payer: Commercial Managed Care - HMO

## 2014-07-05 DIAGNOSIS — E785 Hyperlipidemia, unspecified: Secondary | ICD-10-CM | POA: Diagnosis not present

## 2014-07-05 DIAGNOSIS — J449 Chronic obstructive pulmonary disease, unspecified: Secondary | ICD-10-CM | POA: Insufficient documentation

## 2014-07-05 DIAGNOSIS — M7989 Other specified soft tissue disorders: Secondary | ICD-10-CM | POA: Insufficient documentation

## 2014-07-05 DIAGNOSIS — I739 Peripheral vascular disease, unspecified: Secondary | ICD-10-CM | POA: Diagnosis not present

## 2014-07-05 DIAGNOSIS — J4489 Other specified chronic obstructive pulmonary disease: Secondary | ICD-10-CM | POA: Insufficient documentation

## 2014-07-05 DIAGNOSIS — E669 Obesity, unspecified: Secondary | ICD-10-CM | POA: Insufficient documentation

## 2014-07-05 DIAGNOSIS — I2510 Atherosclerotic heart disease of native coronary artery without angina pectoris: Secondary | ICD-10-CM | POA: Diagnosis not present

## 2014-07-05 DIAGNOSIS — M79609 Pain in unspecified limb: Secondary | ICD-10-CM | POA: Diagnosis not present

## 2014-07-05 DIAGNOSIS — M79606 Pain in leg, unspecified: Secondary | ICD-10-CM

## 2014-07-05 DIAGNOSIS — Z8672 Personal history of thrombophlebitis: Secondary | ICD-10-CM

## 2014-07-05 DIAGNOSIS — Z9889 Other specified postprocedural states: Secondary | ICD-10-CM | POA: Diagnosis not present

## 2014-07-05 DIAGNOSIS — I10 Essential (primary) hypertension: Secondary | ICD-10-CM | POA: Diagnosis not present

## 2014-07-05 DIAGNOSIS — E119 Type 2 diabetes mellitus without complications: Secondary | ICD-10-CM | POA: Diagnosis not present

## 2014-07-05 DIAGNOSIS — Z87891 Personal history of nicotine dependence: Secondary | ICD-10-CM | POA: Diagnosis not present

## 2014-07-05 DIAGNOSIS — Z86718 Personal history of other venous thrombosis and embolism: Secondary | ICD-10-CM

## 2014-07-05 DIAGNOSIS — R0602 Shortness of breath: Secondary | ICD-10-CM | POA: Insufficient documentation

## 2014-07-05 DIAGNOSIS — R609 Edema, unspecified: Secondary | ICD-10-CM

## 2014-07-05 LAB — LIPID PANEL
CHOLESTEROL: 153 mg/dL (ref 0–200)
HDL: 42.2 mg/dL (ref >39.00)
LDL Cholesterol: 90 mg/dL (ref 0–99)
NONHDL: 110.8
TRIGLYCERIDES: 102 mg/dL (ref 0.0–149.0)
Total CHOL/HDL Ratio: 4
VLDL: 20.4 mg/dL (ref 0.0–40.0)

## 2014-07-05 NOTE — Progress Notes (Signed)
Lower extremity venous duplex unilateral performed

## 2014-07-06 ENCOUNTER — Encounter: Payer: Self-pay | Admitting: General Surgery

## 2014-07-06 ENCOUNTER — Other Ambulatory Visit: Payer: Self-pay | Admitting: General Surgery

## 2014-07-06 ENCOUNTER — Telehealth: Payer: Self-pay | Admitting: Cardiology

## 2014-07-06 DIAGNOSIS — E785 Hyperlipidemia, unspecified: Secondary | ICD-10-CM

## 2014-07-06 MED ORDER — EZETIMIBE-SIMVASTATIN 10-20 MG PO TABS
1.0000 | ORAL_TABLET | Freq: Every day | ORAL | Status: DC
Start: 2014-07-06 — End: 2014-07-08

## 2014-07-06 NOTE — Telephone Encounter (Signed)
Pt called me back and stated that when he was on a full tablet of VYTORIN 10-20 he had arm pain all the way up his arm and as soon as we put him down to the half a tab the pain went away and he was ok to continue with the medication. TO Dr Radford Pax advise

## 2014-07-06 NOTE — Telephone Encounter (Signed)
New problem   Pt returning a call from nurse concerning his medication.

## 2014-07-06 NOTE — Telephone Encounter (Signed)
Please address statin intolerance and LDL not at goal ? [facility] 9 candidate?

## 2014-07-07 NOTE — Telephone Encounter (Signed)
Reviewed pt's previous labs on Vytorin.  He has been on as much as 10/80 in the past with LDLs around the 70s.  LDL has not increased much with only taking 5/10 (LDL now 90).  His pain is likely due to simvastatin component rather than zetia.  Changing to 10/10 dose would not give [place] a significant increase in LDL reduction (~5%).  His PCI was in 2010 and he is 80 years old.  Given these factors, would keep him on 1/2 tablet and recheck labs in 3 months.  If his LDL continues to increase, may have to consider switching to atorvastatin or Crestor.

## 2014-07-07 NOTE — Telephone Encounter (Signed)
Per Alferd Apa, PharmD, please have patient go back down to Vytorin 1/2 tablet daily and recheck lipids and ALT in 3 months

## 2014-07-08 ENCOUNTER — Other Ambulatory Visit: Payer: Self-pay | Admitting: Cardiology

## 2014-07-08 MED ORDER — EZETIMIBE-SIMVASTATIN 10-20 MG PO TABS
0.5000 | ORAL_TABLET | Freq: Every day | ORAL | Status: DC
Start: 2014-07-08 — End: 2016-09-22

## 2014-07-08 NOTE — Telephone Encounter (Signed)
Pt is aware and med list updated.

## 2014-07-12 ENCOUNTER — Inpatient Hospital Stay (HOSPITAL_COMMUNITY)
Admission: EM | Admit: 2014-07-12 | Discharge: 2014-07-14 | DRG: 193 | Disposition: A | Discharge status: Home / Self Care | Payer: Medicare HMO | Attending: Internal Medicine | Admitting: Internal Medicine

## 2014-07-12 ENCOUNTER — Encounter (HOSPITAL_COMMUNITY): Payer: Self-pay | Admitting: Emergency Medicine

## 2014-07-12 ENCOUNTER — Emergency Department (HOSPITAL_COMMUNITY): Payer: Medicare HMO

## 2014-07-12 DIAGNOSIS — J45909 Unspecified asthma, uncomplicated: Secondary | ICD-10-CM | POA: Diagnosis present

## 2014-07-12 DIAGNOSIS — E039 Hypothyroidism, unspecified: Secondary | ICD-10-CM | POA: Diagnosis present

## 2014-07-12 DIAGNOSIS — Z87891 Personal history of nicotine dependence: Secondary | ICD-10-CM | POA: Diagnosis not present

## 2014-07-12 DIAGNOSIS — Z888 Allergy status to other drugs, medicaments and biological substances status: Secondary | ICD-10-CM | POA: Diagnosis not present

## 2014-07-12 DIAGNOSIS — Z8249 Family history of ischemic heart disease and other diseases of the circulatory system: Secondary | ICD-10-CM | POA: Diagnosis not present

## 2014-07-12 DIAGNOSIS — E119 Type 2 diabetes mellitus without complications: Secondary | ICD-10-CM | POA: Diagnosis present

## 2014-07-12 DIAGNOSIS — Z86718 Personal history of other venous thrombosis and embolism: Secondary | ICD-10-CM

## 2014-07-12 DIAGNOSIS — I739 Peripheral vascular disease, unspecified: Secondary | ICD-10-CM | POA: Diagnosis present

## 2014-07-12 DIAGNOSIS — Z7982 Long term (current) use of aspirin: Secondary | ICD-10-CM

## 2014-07-12 DIAGNOSIS — J440 Chronic obstructive pulmonary disease with acute lower respiratory infection: Secondary | ICD-10-CM | POA: Diagnosis present

## 2014-07-12 DIAGNOSIS — J449 Chronic obstructive pulmonary disease, unspecified: Secondary | ICD-10-CM | POA: Diagnosis present

## 2014-07-12 DIAGNOSIS — Z9889 Other specified postprocedural states: Secondary | ICD-10-CM

## 2014-07-12 DIAGNOSIS — F419 Anxiety disorder, unspecified: Secondary | ICD-10-CM | POA: Diagnosis present

## 2014-07-12 DIAGNOSIS — D62 Acute posthemorrhagic anemia: Secondary | ICD-10-CM | POA: Diagnosis not present

## 2014-07-12 DIAGNOSIS — J189 Pneumonia, unspecified organism: Principal | ICD-10-CM | POA: Diagnosis present

## 2014-07-12 DIAGNOSIS — Z955 Presence of coronary angioplasty implant and graft: Secondary | ICD-10-CM

## 2014-07-12 DIAGNOSIS — J9601 Acute respiratory failure with hypoxia: Secondary | ICD-10-CM | POA: Diagnosis not present

## 2014-07-12 DIAGNOSIS — Z6830 Body mass index (BMI) 30.0-30.9, adult: Secondary | ICD-10-CM | POA: Diagnosis not present

## 2014-07-12 DIAGNOSIS — E780 Pure hypercholesterolemia: Secondary | ICD-10-CM | POA: Diagnosis present

## 2014-07-12 DIAGNOSIS — Z825 Family history of asthma and other chronic lower respiratory diseases: Secondary | ICD-10-CM

## 2014-07-12 DIAGNOSIS — Z9981 Dependence on supplemental oxygen: Secondary | ICD-10-CM | POA: Diagnosis not present

## 2014-07-12 DIAGNOSIS — J441 Chronic obstructive pulmonary disease with (acute) exacerbation: Secondary | ICD-10-CM | POA: Diagnosis present

## 2014-07-12 DIAGNOSIS — J209 Acute bronchitis, unspecified: Secondary | ICD-10-CM | POA: Diagnosis present

## 2014-07-12 DIAGNOSIS — I2510 Atherosclerotic heart disease of native coronary artery without angina pectoris: Secondary | ICD-10-CM | POA: Diagnosis present

## 2014-07-12 DIAGNOSIS — K219 Gastro-esophageal reflux disease without esophagitis: Secondary | ICD-10-CM | POA: Diagnosis present

## 2014-07-12 DIAGNOSIS — Z833 Family history of diabetes mellitus: Secondary | ICD-10-CM

## 2014-07-12 DIAGNOSIS — J9600 Acute respiratory failure, unspecified whether with hypoxia or hypercapnia: Secondary | ICD-10-CM | POA: Diagnosis present

## 2014-07-12 DIAGNOSIS — E669 Obesity, unspecified: Secondary | ICD-10-CM | POA: Diagnosis present

## 2014-07-12 DIAGNOSIS — Z79899 Other long term (current) drug therapy: Secondary | ICD-10-CM

## 2014-07-12 DIAGNOSIS — I5032 Chronic diastolic (congestive) heart failure: Secondary | ICD-10-CM | POA: Diagnosis present

## 2014-07-12 DIAGNOSIS — I10 Essential (primary) hypertension: Secondary | ICD-10-CM | POA: Diagnosis present

## 2014-07-12 DIAGNOSIS — E785 Hyperlipidemia, unspecified: Secondary | ICD-10-CM | POA: Diagnosis present

## 2014-07-12 DIAGNOSIS — G4733 Obstructive sleep apnea (adult) (pediatric): Secondary | ICD-10-CM | POA: Diagnosis present

## 2014-07-12 DIAGNOSIS — D649 Anemia, unspecified: Secondary | ICD-10-CM | POA: Diagnosis present

## 2014-07-12 DIAGNOSIS — I509 Heart failure, unspecified: Secondary | ICD-10-CM

## 2014-07-12 LAB — CBC
HEMATOCRIT: 39.8 % (ref 39.0–52.0)
HEMOGLOBIN: 13.2 g/dL (ref 13.0–17.0)
MCH: 29 pg (ref 26.0–34.0)
MCHC: 33.2 g/dL (ref 30.0–36.0)
MCV: 87.5 fL (ref 78.0–100.0)
Platelets: 144 10*3/uL — ABNORMAL LOW (ref 150–400)
RBC: 4.55 MIL/uL (ref 4.22–5.81)
RDW: 14.3 % (ref 11.5–15.5)
WBC: 12.9 10*3/uL — AB (ref 4.0–10.5)

## 2014-07-12 LAB — I-STAT TROPONIN, ED: Troponin i, poc: 0 ng/mL (ref 0.00–0.08)

## 2014-07-12 LAB — URINE MICROSCOPIC-ADD ON

## 2014-07-12 LAB — URINALYSIS, ROUTINE W REFLEX MICROSCOPIC
Bilirubin Urine: NEGATIVE
Glucose, UA: NEGATIVE mg/dL
KETONES UR: NEGATIVE mg/dL
LEUKOCYTES UA: NEGATIVE
NITRITE: NEGATIVE
PROTEIN: NEGATIVE mg/dL
Specific Gravity, Urine: 1.02 (ref 1.005–1.030)
UROBILINOGEN UA: 0.2 mg/dL (ref 0.0–1.0)
pH: 5.5 (ref 5.0–8.0)

## 2014-07-12 LAB — BASIC METABOLIC PANEL
ANION GAP: 15 (ref 5–15)
BUN: 19 mg/dL (ref 6–23)
CALCIUM: 9.6 mg/dL (ref 8.4–10.5)
CHLORIDE: 97 meq/L (ref 96–112)
CO2: 26 meq/L (ref 19–32)
Creatinine, Ser: 1.07 mg/dL (ref 0.50–1.35)
GFR calc Af Amer: 73 mL/min — ABNORMAL LOW (ref >90)
GFR calc non Af Amer: 63 mL/min — ABNORMAL LOW (ref >90)
Glucose, Bld: 151 mg/dL — ABNORMAL HIGH (ref 70–99)
Potassium: 4.1 mEq/L (ref 3.7–5.3)
SODIUM: 138 meq/L (ref 137–147)

## 2014-07-12 LAB — DIFFERENTIAL
BASOS ABS: 0 10*3/uL (ref 0.0–0.1)
Basophils Relative: 0 % (ref 0–1)
EOS ABS: 0 10*3/uL (ref 0.0–0.7)
EOS PCT: 0 % (ref 0–5)
LYMPHS PCT: 9 % — AB (ref 12–46)
Lymphs Abs: 1.2 10*3/uL (ref 0.7–4.0)
Monocytes Absolute: 0.7 10*3/uL (ref 0.1–1.0)
Monocytes Relative: 5 % (ref 3–12)
Neutro Abs: 11.5 10*3/uL — ABNORMAL HIGH (ref 1.7–7.7)
Neutrophils Relative %: 86 % — ABNORMAL HIGH (ref 43–77)

## 2014-07-12 LAB — I-STAT CG4 LACTIC ACID, ED: LACTIC ACID, VENOUS: 3.22 mmol/L — AB (ref 0.5–2.2)

## 2014-07-12 LAB — GLUCOSE, CAPILLARY
Glucose-Capillary: 245 mg/dL — ABNORMAL HIGH (ref 70–99)
Glucose-Capillary: 264 mg/dL — ABNORMAL HIGH (ref 70–99)

## 2014-07-12 LAB — PRO B NATRIURETIC PEPTIDE: Pro B Natriuretic peptide (BNP): 31.2 pg/mL (ref 0–450)

## 2014-07-12 MED ORDER — ONDANSETRON HCL 4 MG PO TABS: 4.0000 mg | ORAL_TABLET | Freq: Four times a day (QID) | ORAL | Status: DC | PRN

## 2014-07-12 MED ORDER — METHYLPREDNISOLONE SODIUM SUCC 125 MG IJ SOLR
60.0000 mg | Freq: Four times a day (QID) | INTRAMUSCULAR | Status: DC
  Administered 2014-07-12 – 2014-07-13 (×3): 60 mg via INTRAVENOUS
  Filled 2014-07-12 (×7): qty 0.96

## 2014-07-12 MED ORDER — SODIUM CHLORIDE 0.9 % IV SOLN
INTRAVENOUS | Status: DC
  Administered 2014-07-12: 18:00:00 via INTRAVENOUS

## 2014-07-12 MED ORDER — DEXTROSE 5 % IV SOLN
1.0000 g | INTRAVENOUS | Status: AC
  Administered 2014-07-12: 1 g via INTRAVENOUS
  Filled 2014-07-12: qty 1

## 2014-07-12 MED ORDER — ACETAMINOPHEN 500 MG PO TABS
1000.0000 mg | ORAL_TABLET | Freq: Once | ORAL | Status: AC
  Administered 2014-07-12: 1000 mg via ORAL
  Filled 2014-07-12: qty 2

## 2014-07-12 MED ORDER — INSULIN ASPART 100 UNIT/ML ~~LOC~~ SOLN
0.0000 [IU] | Freq: Three times a day (TID) | SUBCUTANEOUS | Status: DC
  Administered 2014-07-13 – 2014-07-14 (×4): 3 [IU] via SUBCUTANEOUS

## 2014-07-12 MED ORDER — BUDESONIDE-FORMOTEROL FUMARATE 80-4.5 MCG/ACT IN AERO
2.0000 | INHALATION_SPRAY | Freq: Two times a day (BID) | RESPIRATORY_TRACT | Status: DC
  Administered 2014-07-12 – 2014-07-14 (×4): 2 via RESPIRATORY_TRACT
  Filled 2014-07-12: qty 6.9

## 2014-07-12 MED ORDER — ALUM & MAG HYDROXIDE-SIMETH 200-200-20 MG/5ML PO SUSP: 30.0000 mL | Freq: Four times a day (QID) | ORAL | Status: DC | PRN

## 2014-07-12 MED ORDER — DEXTROSE 5 % IV SOLN
1.0000 g | INTRAVENOUS | Status: DC
  Administered 2014-07-12 – 2014-07-14 (×2): 1 g via INTRAVENOUS
  Filled 2014-07-12 (×2): qty 10

## 2014-07-12 MED ORDER — ONDANSETRON HCL 4 MG/2ML IJ SOLN: 4.0000 mg | Freq: Four times a day (QID) | INTRAMUSCULAR | Status: DC | PRN

## 2014-07-12 MED ORDER — INSULIN ASPART 100 UNIT/ML ~~LOC~~ SOLN
0.0000 [IU] | Freq: Every day | SUBCUTANEOUS | Status: DC
  Administered 2014-07-12: 3 [IU] via SUBCUTANEOUS
  Administered 2014-07-13: 2 [IU] via SUBCUTANEOUS

## 2014-07-12 MED ORDER — ACETAMINOPHEN 325 MG PO TABS: 650.0000 mg | ORAL_TABLET | Freq: Four times a day (QID) | ORAL | Status: DC | PRN

## 2014-07-12 MED ORDER — EZETIMIBE-SIMVASTATIN 10-20 MG PO TABS
0.5000 | ORAL_TABLET | Freq: Every day | ORAL | Status: DC
  Administered 2014-07-12 – 2014-07-14 (×3): 0.5 via ORAL
  Filled 2014-07-12 (×3): qty 0.5

## 2014-07-12 MED ORDER — AZITHROMYCIN 500 MG IV SOLR
500.0000 mg | INTRAVENOUS | Status: DC
  Administered 2014-07-12 – 2014-07-13 (×2): 500 mg via INTRAVENOUS
  Filled 2014-07-12 (×2): qty 500

## 2014-07-12 MED ORDER — SODIUM CHLORIDE 0.9 % IV BOLUS (SEPSIS)
500.0000 mL | Freq: Once | INTRAVENOUS | Status: AC
  Administered 2014-07-12: 500 mL via INTRAVENOUS

## 2014-07-12 MED ORDER — LEVOTHYROXINE SODIUM 200 MCG PO TABS
200.0000 ug | ORAL_TABLET | Freq: Every day | ORAL | Status: DC
  Administered 2014-07-13 – 2014-07-14 (×2): 200 ug via ORAL
  Filled 2014-07-12 (×3): qty 1

## 2014-07-12 MED ORDER — VANCOMYCIN HCL IN DEXTROSE 750-5 MG/150ML-% IV SOLN: 750.0000 mg | Freq: Two times a day (BID) | INTRAVENOUS | Status: DC

## 2014-07-12 MED ORDER — GLIMEPIRIDE 2 MG PO TABS
2.0000 mg | ORAL_TABLET | Freq: Every day | ORAL | Status: DC
  Administered 2014-07-13 – 2014-07-14 (×2): 2 mg via ORAL
  Filled 2014-07-12 (×3): qty 1

## 2014-07-12 MED ORDER — DEXTROSE 5 % IV SOLN
1.0000 g | Freq: Once | INTRAVENOUS | Status: DC
  Filled 2014-07-12: qty 10

## 2014-07-12 MED ORDER — ACETAMINOPHEN 650 MG RE SUPP: 650.0000 mg | Freq: Four times a day (QID) | RECTAL | Status: DC | PRN

## 2014-07-12 MED ORDER — METOPROLOL TARTRATE 25 MG PO TABS
25.0000 mg | ORAL_TABLET | Freq: Every day | ORAL | Status: DC
  Administered 2014-07-12 – 2014-07-14 (×3): 25 mg via ORAL
  Filled 2014-07-12 (×3): qty 1

## 2014-07-12 MED ORDER — ISOSORBIDE MONONITRATE ER 60 MG PO TB24
60.0000 mg | ORAL_TABLET | Freq: Every day | ORAL | Status: DC
  Administered 2014-07-12 – 2014-07-14 (×3): 60 mg via ORAL
  Filled 2014-07-12 (×3): qty 1

## 2014-07-12 MED ORDER — VANCOMYCIN HCL 10 G IV SOLR
1500.0000 mg | INTRAVENOUS | Status: AC
  Administered 2014-07-12: 1500 mg via INTRAVENOUS
  Filled 2014-07-12: qty 1500

## 2014-07-12 MED ORDER — IPRATROPIUM-ALBUTEROL 0.5-2.5 (3) MG/3ML IN SOLN
3.0000 mL | Freq: Once | RESPIRATORY_TRACT | Status: AC
  Administered 2014-07-12: 3 mL via RESPIRATORY_TRACT
  Filled 2014-07-12: qty 3

## 2014-07-12 MED ORDER — ENOXAPARIN SODIUM 40 MG/0.4ML ~~LOC~~ SOLN
40.0000 mg | SUBCUTANEOUS | Status: DC
  Filled 2014-07-12 (×3): qty 0.4

## 2014-07-12 MED ORDER — ASPIRIN EC 81 MG PO TBEC
81.0000 mg | DELAYED_RELEASE_TABLET | Freq: Every day | ORAL | Status: DC
  Administered 2014-07-12 – 2014-07-14 (×3): 81 mg via ORAL
  Filled 2014-07-12 (×3): qty 1

## 2014-07-12 MED ORDER — METFORMIN HCL 500 MG PO TABS
500.0000 mg | ORAL_TABLET | Freq: Two times a day (BID) | ORAL | Status: DC
  Administered 2014-07-13 – 2014-07-14 (×3): 500 mg via ORAL
  Filled 2014-07-12 (×5): qty 1

## 2014-07-12 MED ORDER — OXYCODONE HCL 5 MG PO TABS: 5.0000 mg | ORAL_TABLET | ORAL | Status: DC | PRN

## 2014-07-12 MED ORDER — IPRATROPIUM-ALBUTEROL 0.5-2.5 (3) MG/3ML IN SOLN
3.0000 mL | RESPIRATORY_TRACT | Status: DC
  Administered 2014-07-12 – 2014-07-13 (×4): 3 mL via RESPIRATORY_TRACT
  Filled 2014-07-12 (×4): qty 3

## 2014-07-12 MED ORDER — SORBITOL 70 % SOLN
30.0000 mL | Freq: Every day | Status: DC | PRN
  Filled 2014-07-12: qty 30

## 2014-07-12 MED ORDER — DEXTROSE 5 % IV SOLN: 1.0000 g | Freq: Three times a day (TID) | INTRAVENOUS | Status: DC

## 2014-07-12 NOTE — ED Notes (Signed)
Pt unable to void a this time

## 2014-07-12 NOTE — ED Provider Notes (Addendum)
TIME SEEN: 3:16 PM  CHIEF COMPLAINT: Shortness of breath  HPI: Patient is a 80 y.o. M with history of hypertension, diabetes, hyperlipidemia, hypothyroidism, sleep apnea using CPAP at night, CAD, CHF, DVT who presents to the emergency department with shortness of breath that started this morning. In the ED, patient is febrile. He reports he has had a dry cough. No vomiting or diarrhea. No rash. No headache, neck pain or neck stiffness. No chest pain. Was given breathing treatments with EMS and IV Solu-Medrol reports he is feeling better. Does not wear oxygen at home except for CPAP at night. No lower extremity swelling or pain. No history of PE  ROS: See HPI Constitutional: no fever  Eyes: no drainage  ENT: no runny nose   Cardiovascular:  no chest pain  Resp:  SOB  GI: no vomiting GU: no dysuria Integumentary: no rash  Allergy: no hives  Musculoskeletal: no leg swelling  Neurological: no slurred speech ROS otherwise negative  PAST MEDICAL HISTORY/PAST SURGICAL HISTORY:  Past Medical History  Diagnosis Date  . Diabetes mellitus   . Asthma   . Hypertension   . Hypercholesterolemia   . Hypothyroidism   . Anxiety   . COPD (chronic obstructive pulmonary disease)   . GERD (gastroesophageal reflux disease)   . Sleep apnea     severe OSA awaiting CPAP titration  . Peripheral vascular disease 11/2006    s/p left stent  . Shortness of breath   . DVT (deep venous thrombosis)   . Diverticulitis Oct. 2013    bleeding in the past and Effient for his CAD was stopped  . Coronary artery disease     s/p PCI of RCA 03/2009 at which time there was 70% ramus and 90% RCA, cath 01/2011 showed patent stents in RCA with moderate prox disesae, aneurysmal left circ and small 90% ramus s/p PCI, patent LAD EF 55%  . Chronic diastolic CHF (congestive heart failure)     diastolic   . OSA (obstructive sleep apnea)     severe on CPAP  . Obesity (BMI 30-39.9)     MEDICATIONS:  Prior to Admission  medications   Medication Sig Start Date End Date Taking? Authorizing Provider  albuterol (PROVENTIL HFA;VENTOLIN HFA) 108 (90 BASE) MCG/ACT inhaler Inhale 2 puffs into the lungs every 6 (six) hours as needed for shortness of breath.     Historical Provider, MD  aspirin EC 81 MG tablet Take 81 mg by mouth daily.    Historical Provider, MD  budesonide-formoterol (SYMBICORT) 80-4.5 MCG/ACT inhaler Inhale 2 puffs into the lungs 2 (two) times daily as needed. FOR WHEEZING    Historical Provider, MD  diazepam (VALIUM) 5 MG tablet He takes only when having procedures done for anxiety. 08/14/12   Theodis Blaze, MD  ezetimibe-simvastatin (VYTORIN) 10-20 MG per tablet Take 0.5 tablets by mouth daily. 07/08/14   Sueanne Margarita, MD  glimepiride (AMARYL) 2 MG tablet Take 2 mg by mouth daily.      Historical Provider, MD  isosorbide mononitrate (IMDUR) 60 MG 24 hr tablet TAKE 1 TABLET (60 MG TOTAL) BY MOUTH DAILY. 07/11/14   Sueanne Margarita, MD  latanoprost (XALATAN) 0.005 % ophthalmic solution Place 1 drop into both eyes as needed.     Historical Provider, MD  levothyroxine (SYNTHROID, LEVOTHROID) 175 MCG tablet Take 175 mcg by mouth daily before breakfast.     Historical Provider, MD  loperamide (IMODIUM A-D) 2 MG tablet Take 2 mg by mouth 4 (  four) times daily as needed. For diarrhea.     Historical Provider, MD  metFORMIN (GLUCOPHAGE) 500 MG tablet Take 500 mg by mouth 2 (two) times daily.     Historical Provider, MD  metoprolol tartrate (LOPRESSOR) 25 MG tablet Take 25 mg by mouth daily.     Historical Provider, MD  nitroGLYCERIN (NITROSTAT) 0.4 MG SL tablet Place 0.4 mg under the tongue every 5 (five) minutes x 3 doses as needed. For chest pain.    Historical Provider, MD  NON FORMULARY Sleeps with CPAP    Historical Provider, MD  omeprazole (PRILOSEC) 20 MG capsule Take 20 mg by mouth daily.    Historical Provider, MD    ALLERGIES:  Allergies  Allergen Reactions  . Clopidogrel Bisulfate Itching     SOCIAL HISTORY:  History  Substance Use Topics  . Smoking status: Former Smoker -- 0.50 packs/day for 15 years    Types: Cigarettes, Cigars    Quit date: 10/15/1963  . Smokeless tobacco: Never Used  . Alcohol Use: No    FAMILY HISTORY: Family History  Problem Relation Age of Onset  . Malignant hyperthermia Neg Hx   . Diabetes Mother   . Hyperlipidemia Mother   . Hypertension Mother   . Heart attack Mother   . Breast cancer Sister   . Diabetes Sister   . Hyperlipidemia Sister   . Hypertension Sister   . Heart attack Sister   . Hyperlipidemia Sister   . Hypertension Sister   . Heart disease Sister     Heart Disease before age 51  . Diabetes Brother   . Asthma Sister     EXAM: BP 123/61  Pulse 121  Temp(Src) 101 F (38.3 C) (Oral)  Resp 20  SpO2 94% CONSTITUTIONAL: Alert and oriented and responds appropriately to questions. Well-appearing; well-nourished HEAD: Normocephalic EYES: Conjunctivae clear, PERRL ENT: normal nose; no rhinorrhea; moist mucous membranes; pharynx without lesions noted NECK: Supple, no meningismus, no LAD  CARD: Regular and tachycardic; S1 and S2 appreciated; no murmurs, no clicks, no rubs, no gallops RESP: Normal chest excursion without splinting , patient is mildly tachypnea, diminished breath sounds at his bases bilaterally, diffuse expiratory wheezing, no rhonchi or rales, speaking full sentences at rest but when he gets up to move around he does become increasingly tachypneic and speak short sentences ABD/GI: Normal bowel sounds; non-distended; soft, non-tender, no rebound, no guarding BACK:  The back appears normal and is non-tender to palpation, there is no CVA tenderness EXT: Normal ROM in all joints; non-tender to palpation; no edema; normal capillary refill; no cyanosis    SKIN: Normal color for age and race; warm, no rash NEURO: Moves all extremities equally PSYCH: The patient's mood and manner are appropriate. Grooming and  personal hygiene are appropriate.  MEDICAL DECISION MAKING: Patient here with fever, tachycardia, tachypnea and shortness of breath. Concern for viral illness versus pneumonia. We'll obtain labs, cultures, lactate, chest x-ray and urine. Will give IV fluids, continue DuoNeb treatments. Anticipate admission.  ED PROGRESS: Labs show leukocytosis with left. His lactate is elevated at 3.22. Urine shows trace hemoglobin but no other sign of infection. Chest x-ray shows bronchial wall thickening concern for chronic bronchitis but given his symptoms, concern for possible chlamydia card and many. Will give azithromycin and ceftriaxone. Discussed with hospitalist for admission. Blood pressures have been stable. Mentating normally. No respiratory distress.     [place], DO 07/13/14 0026    EKG Interpretation  Date/Time:  Tuesday July 12 2014 14:35:05 EDT Ventricular Rate:  123 PR Interval:  167 QRS Duration: 83 QT Interval:  313 QTC Calculation: 448 R Axis:   6 Text Interpretation:  Sinus tachycardia Ventricular premature complex Confirmed by Oather Muilenburg,  DO, Charisse Wendell ([id number]) on 07/12/2014 5:14:42 PM        [place], DO 07/13/14 [id number]

## 2014-07-12 NOTE — Progress Notes (Signed)
ANTIBIOTIC CONSULT NOTE - INITIAL  Pharmacy Consult for Vancomycin, Cefepime Indication: sepsis  Allergies  Allergen Reactions  . Clopidogrel Bisulfate Itching    Patient Measurements:    Vital Signs: Temp: 99.2 F (37.3 C) (09/29 1611) Temp src: Oral (09/29 1611) BP: 134/62 mmHg (09/29 1611) Pulse Rate: 114 (09/29 1611) Intake/Output from previous day:   Intake/Output from this shift:    Labs:  Recent Labs  07/12/14 1457  WBC 12.9*  HGB 13.2  PLT 144*  CREATININE 1.07   The CrCl is unknown because both a height and weight (above a minimum accepted value) are required for this calculation. No results found for this basename: VANCOTROUGH, VANCOPEAK, VANCORANDOM, GENTTROUGH, GENTPEAK, GENTRANDOM, TOBRATROUGH, TOBRAPEAK, TOBRARND, AMIKACINPEAK, AMIKACINTROU, AMIKACIN,  in the last 72 hours   Microbiology: No results found for this or any previous visit (from the past 720 hour(s)).  Medical History: Past Medical History  Diagnosis Date  . Diabetes mellitus   . Asthma   . Hypertension   . Hypercholesterolemia   . Hypothyroidism   . Anxiety   . COPD (chronic obstructive pulmonary disease)   . GERD (gastroesophageal reflux disease)   . Sleep apnea     severe OSA awaiting CPAP titration  . Peripheral vascular disease 11/2006    s/p left stent  . Shortness of breath   . DVT (deep venous thrombosis)   . Diverticulitis Oct. 2013    bleeding in the past and Effient for his CAD was stopped  . Coronary artery disease     s/p PCI of RCA 03/2009 at which time there was 70% ramus and 90% RCA, cath 01/2011 showed patent stents in RCA with moderate prox disesae, aneurysmal left circ and small 90% ramus s/p PCI, patent LAD EF 55%  . Chronic diastolic CHF (congestive heart failure)     diastolic   . OSA (obstructive sleep apnea)     severe on CPAP  . Obesity (BMI 30-39.9)      Assessment: 53 yoM presents from home with c/o respiratory distress since this morning.  WBC  elevated, RR high, and LA 3.22.  Patient will be admitted with sepsis and pharmacy consulted to dose vancomycin and cefepime.   Weight during recent office visit (06/21/14): 95.3 kg  Tmax: 101  WBC: elevated  Renal: SCr 1.07, CrCl~71 ml/min (CG), ~53 ml/min (normalized)  Blood culture sent and urine cultures ordered.  Goal of Therapy:  Vancomycin trough level 15-20 mcg/ml Doses adjusted per renal function Eradication of infection  Plan:  1.  Vancomycin 1500 mg x 1 now then 750 mg IV q12h. 2.  Cefepime 1g IV q8h. 3.  F/u SCr, trough levels, clinical course.  Hershal Coria 07/12/2014,4:26 PM

## 2014-07-12 NOTE — ED Notes (Signed)
Per EMS pt coming from  Home with c/o respiratory distress since this morning. EMS administered 15mg  albuterol, 0.5 mg atrovent and 125 mg IV solumedrol en route with minimal relief. Pt denies pain, reports nonproductive cough, hx of asthma, copd. Audible wheezing throughout

## 2014-07-12 NOTE — H&P (Signed)
[facility] History and Physical  [name] DAFFIN PJA:[id number] DOB: 1929-11-27 DOA: 07/12/2014  Referring physician:  PCP: [name], MD   Chief Complaint: Shortness of breath  HPI: [name] is a 80 y.o. male with a past medical history of chronic obstructive pulmonary disease, type 2 diabetes mellitus, hypertension, who presents to the emergency room with complaints of cough and shortness of breath. He reports being in his usual state health, waking up this morning experiencing shortness of breath that was associated with nonproductive cough. He also complains of subjective fevers, chills, malaise and feeling ill. He denies chest pain, hemoptysis, abdominal pain, hematuria nausea or vomiting. He reports having a sick contact at his church last Sunday who had significant cough and appeared ill. In the emergency room he was found to be tachypneic having a respiratory rate of 30. He was also found to have a temperature of 101. Initial workup included a chest x-ray which showed chronic diffuse mild peribronchial thickening without superimposed abnormality. He reports feeling better after receiving breathing treatments.                                                                                                                                                                                                                                          Review of Systems:  Constitutional:  No weight loss, night sweats,  positive for Fevers, chills, fatigue.  HEENT:  No headaches, Difficulty swallowing,Tooth/dental problems,Sore throat,  No sneezing, itching, ear ache, nasal congestion, post nasal drip,  Cardio-vascular:  No chest pain, Orthopnea, PND, swelling in lower extremities, anasarca, dizziness, palpitations  GI:  No heartburn, indigestion, abdominal pain, nausea, vomiting, diarrhea, change in bowel habits, loss of appetite  Resp:  Positive for shortness of breath with exertion  or at rest. No excess mucus, no productive cough, No non-productive cough, No coughing up of blood.No change in color of mucus.No wheezing.No chest wall deformity  Skin:  no rash or lesions.  GU:  no dysuria, change in color of urine, no urgency or frequency. No flank pain.  Musculoskeletal:  No joint pain or swelling. No decreased range of motion. No back pain.  Psych:  No change in mood or affect. No depression or anxiety. No memory loss.   Past Medical History  Diagnosis Date  . Diabetes mellitus   . Asthma   . Hypertension   . Hypercholesterolemia   .  Hypothyroidism   . Anxiety   . COPD (chronic obstructive pulmonary disease)   . GERD (gastroesophageal reflux disease)   . Sleep apnea     severe OSA awaiting CPAP titration  . Peripheral vascular disease 11/2006    s/p left stent  . Shortness of breath   . DVT (deep venous thrombosis)   . Diverticulitis Oct. 2013    bleeding in the past and Effient for his CAD was stopped  . Coronary artery disease     s/p PCI of RCA 03/2009 at which time there was 70% ramus and 90% RCA, cath 01/2011 showed patent stents in RCA with moderate prox disesae, aneurysmal left circ and small 90% ramus s/p PCI, patent LAD EF 55%  . Chronic diastolic CHF (congestive heart failure)     diastolic   . OSA (obstructive sleep apnea)     severe on CPAP  . Obesity (BMI 30-39.9)    Past Surgical History  Procedure Laterality Date  . Coronary stent placement  2010 and 2012  . Hernia repair  1992    evntral hernia repair  . Esophagogastroduodenoscopy  12/26/2011    Procedure: ESOPHAGOGASTRODUODENOSCOPY (EGD);  Surgeon: Garlan Fair, MD;  Location: [facility];  Service: Endoscopy;  Laterality: N/A;  . Balloon dilation  12/26/2011    Procedure: BALLOON DILATION;  Surgeon: Garlan Fair, MD;  Location: [facility];  Service: Endoscopy;  Laterality: N/A;  . Esophagogastroduodenoscopy  08/13/2012    Procedure: ESOPHAGOGASTRODUODENOSCOPY (EGD);  Surgeon:  Arta Silence, MD;  Location: [facility];  Service: Endoscopy;  Laterality: Left;  . Colonoscopy  08/14/2012    Procedure: COLONOSCOPY;  Surgeon: Arta Silence, MD;  Location: [facility];  Service: Endoscopy;  Laterality: N/A;   Social History:  reports that he quit smoking about 50 years ago. His smoking use included Cigarettes and Cigars. He has a 7.5 pack-year smoking history. He has never used smokeless tobacco. He reports that he does not drink alcohol or use illicit drugs.  Allergies  Allergen Reactions  . Clopidogrel Bisulfate Itching    Family History  Problem Relation Age of Onset  . Malignant hyperthermia Neg Hx   . Diabetes Mother   . Hyperlipidemia Mother   . Hypertension Mother   . Heart attack Mother   . Breast cancer Sister   . Diabetes Sister   . Hyperlipidemia Sister   . Hypertension Sister   . Heart attack Sister   . Hyperlipidemia Sister   . Hypertension Sister   . Heart disease Sister     Heart Disease before age 28  . Diabetes Brother   . Asthma Sister      Prior to Admission medications   Medication Sig Start Date End Date Taking? Authorizing Provider  albuterol (PROVENTIL HFA;VENTOLIN HFA) 108 (90 BASE) MCG/ACT inhaler Inhale 2 puffs into the lungs every 6 (six) hours as needed for wheezing or shortness of breath (wheezing).    Yes Historical Provider, MD  aspirin EC 81 MG tablet Take 81 mg by mouth daily.   Yes Historical Provider, MD  budesonide-formoterol (SYMBICORT) 80-4.5 MCG/ACT inhaler Inhale 2 puffs into the lungs 2 (two) times daily as needed (wheezing). FOR WHEEZING   Yes Historical Provider, MD  ezetimibe-simvastatin (VYTORIN) 10-20 MG per tablet Take 0.5 tablets by mouth daily. 07/08/14  Yes Sueanne Margarita, MD  glimepiride (AMARYL) 2 MG tablet Take 2 mg by mouth daily.     Yes Historical Provider, MD  isosorbide mononitrate (IMDUR) 60 MG 24 hr  tablet Take 60 mg by mouth daily.   Yes Historical Provider, MD  levothyroxine (SYNTHROID,  LEVOTHROID) 200 MCG tablet Take 200 mcg by mouth daily before breakfast.   Yes Historical Provider, MD  loperamide (IMODIUM A-D) 2 MG tablet Take 2 mg by mouth 4 (four) times daily as needed (loose stools). For diarrhea.   Yes Historical Provider, MD  metFORMIN (GLUCOPHAGE) 500 MG tablet Take 500 mg by mouth 2 (two) times daily.    Yes Historical Provider, MD  metoprolol tartrate (LOPRESSOR) 25 MG tablet Take 25 mg by mouth daily.    Yes Historical Provider, MD  NON FORMULARY Sleeps with CPAP Machine   Yes Historical Provider, MD  omeprazole (PRILOSEC) 20 MG capsule Take 20 mg by mouth daily.   Yes Historical Provider, MD  nitroGLYCERIN (NITROSTAT) 0.4 MG SL tablet Place 0.4 mg under the tongue every 5 (five) minutes x 3 doses as needed. For chest pain.    Historical Provider, MD   Physical Exam: Filed Vitals:   07/12/14 1611 07/12/14 1630 07/12/14 1700 07/12/14 1731  BP: 134/62 125/66 130/61 101/82  Pulse: 114  109 112  Temp: 99.2 F (37.3 C)   98.7 F (37.1 C)  TempSrc: Oral   Oral  Resp: 22 26 21 23   SpO2:   97% 97%    Wt Readings from Last 3 Encounters:  06/21/14 95.346 kg (210 lb 3.2 oz)  12/27/13 92.715 kg (204 lb 6.4 oz)  09/15/13 94.348 kg (208 lb)    General:  Patient on supplemental oxygen, ill-appearing, although no acute distress, answering questions appropriately, mentating well  Eyes: PERRL, normal lids, irises & conjunctiva ENT: grossly normal hearing, lips & tongue Neck: no LAD, masses or thyromegaly Cardiovascular:  tachycardic, RRR, no m/r/g. has 1+ bilateral extremity pitting edema  Telemetry: SR, no arrhythmias  Respiratory: diminished breath sounds bilaterally, now mildly tachypneic, on supplemental oxygen, bilateral expiratory wheezes were auscultated  Abdomen: soft, ntnd Skin: no rash or induration seen on limited exam Musculoskeletal: 1+ bilateral extremity pitting edema  Psychiatric: grossly normal mood and affect, speech fluent and  appropriate Neurologic: grossly non-focal.          Labs on Admission:  Basic Metabolic Panel:  Recent Labs Lab 07/12/14 1457  NA 138  K 4.1  CL 97  CO2 26  GLUCOSE 151*  BUN 19  CREATININE 1.07  CALCIUM 9.6   Liver Function Tests: No results found for this basename: AST, ALT, ALKPHOS, BILITOT, PROT, ALBUMIN,  in the last 168 hours No results found for this basename: LIPASE, AMYLASE,  in the last 168 hours No results found for this basename: AMMONIA,  in the last 168 hours CBC:  Recent Labs Lab 07/12/14 1457 07/12/14 1542  WBC 12.9*  --   NEUTROABS  --  11.5*  HGB 13.2  --   HCT 39.8  --   MCV 87.5  --   PLT 144*  --    Cardiac Enzymes: No results found for this basename: CKTOTAL, CKMB, CKMBINDEX, TROPONINI,  in the last 168 hours  BNP (last 3 results)  Recent Labs  07/12/14 1542  PROBNP 31.2   CBG: No results found for this basename: GLUCAP,  in the last 168 hours  Radiological Exams on Admission: Dg Chest Port 1 View  07/12/2014   CLINICAL DATA:  Shortness of breath this morning. History of asthma a hypertension diabetes. COPD. CHF.  EXAM: PORTABLE CHEST - 1 VIEW  COMPARISON:  11/22/2013 and 08/12/2012.  Chest  CT 01/05/2011.  FINDINGS: Heart mediastinal contours appear stable. Cardiac leads project over the chest. Pulmonary vascularity is borderline prominent. No focal airspace disease, edema, or visible pleural effusion. Slight current peribronchial thickening may be secondary to patient's reported COPD. Calcified granuloma left midlung, as seen on chest CT from 2012. Negative for pneumothorax.  No acute osseous abnormality identified.  IMPRESSION: Chronic diffuse mild peribronchial thickening may be secondary to patient's reported COPD. No acute superimposed abnormality identified.   Electronically Signed   By: Curlene Dolphin M.D.   On: 07/12/2014 15:52    EKG: Independently reviewed. EKG showing sinus tachycardia   Assessment/Plan Principal Problem:    CAP (community acquired pneumonia) Active Problems:   COPD exacerbation   Acute respiratory failure   HYPERLIPIDEMIA   HYPERTENSION, BENIGN   Coronary artery disease   Chronic diastolic CHF (congestive heart failure)   COPD (chronic obstructive pulmonary disease)   1. Acute respiratory failure. Evidence by a respiratory rate of 30, patient requiring supplemental oxygen, presented in respiratory distress, likely secondary to community acquired pneumonia with superimposed COPD exacerbation. Patient improved after receiving meds in the emergency room. Will admit patient to telemetry, start empiric antimicrobial therapy, steroids, nebulizer treatments. Plan to repeat chest x-ray in a.m. 2. Chronic obstructive pulmonary disease exacerbation. Suspect precipitated by underlying infectious process. Will treat with IV Solu-Medrol 60 mg every 6 hours, scheduled duo nebs every 4 hours, inhale steroids, empiric antibiotic therapy.  3. Probable community-acquired pneumonia. Patient reports having a sick contact last Sunday who had respiratory symptoms. Chest x-ray did not show obvious infiltrate, will cover empirically with IV azithromycin and ceftriaxone. Obtain blood cultures and sputum cultures, provide supportive care. Will repeat chest x-ray in a.m. 4. Type 2 diabetes mellitus. Patient to be started on steroids, will perform Accu-Cheks q. a.c. and each bedtime with sliding scale coverage. Continue oral hypoglycemic therapy.   5. Chronic diastolic congestive heart failure. Last transthoracic echocardiogram done in 2011 which showed a preserved ejection fraction a grade 1 diastolic dysfunction. Initial lab work revealed a BNP of 31.2. I think it's unlikely that acute CHF is contributing to respiratory failure. Will monitor closely 6. Hypertension. Will continue Imdur were 60 mg by mouth daily and metoprolol 25 mg by mouth daily 7. Hypothyroidism. Check TSH, continue Synthroid 200 mcg by mouth daily 8. DVT  prophylaxis. Lovenox    Code Status: Full Code Family Communication: Spoke with his daughters present at bedside Disposition Plan: Will admit to telemetry anticipate patient will require greater than 2 nights hospitalization  Time spent: 70 min  Kelvin Cellar [facility] Pager [phone]

## 2014-07-12 NOTE — ED Notes (Signed)
Bed: WA24 Expected date:  Expected time:  Means of arrival:  Comments: EMS 

## 2014-07-13 ENCOUNTER — Inpatient Hospital Stay (HOSPITAL_COMMUNITY): Payer: Medicare HMO

## 2014-07-13 DIAGNOSIS — J189 Pneumonia, unspecified organism: Secondary | ICD-10-CM

## 2014-07-13 LAB — URINE CULTURE
Colony Count: NO GROWTH
Culture: NO GROWTH

## 2014-07-13 LAB — BASIC METABOLIC PANEL
Anion gap: 13 (ref 5–15)
BUN: 19 mg/dL (ref 6–23)
CO2: 25 mEq/L (ref 19–32)
Calcium: 9 mg/dL (ref 8.4–10.5)
Chloride: 101 mEq/L (ref 96–112)
Creatinine, Ser: 1.02 mg/dL (ref 0.50–1.35)
GFR calc Af Amer: 77 mL/min — ABNORMAL LOW (ref >90)
GFR calc non Af Amer: 66 mL/min — ABNORMAL LOW (ref >90)
Glucose, Bld: 200 mg/dL — ABNORMAL HIGH (ref 70–99)
Potassium: 4.1 mEq/L (ref 3.7–5.3)
SODIUM: 139 meq/L (ref 137–147)

## 2014-07-13 LAB — INFLUENZA PANEL BY PCR (TYPE A & B)
H1N1 flu by pcr: NOT DETECTED
Influenza A By PCR: NEGATIVE
Influenza B By PCR: NEGATIVE

## 2014-07-13 LAB — CBC
HCT: 36.6 % — ABNORMAL LOW (ref 39.0–52.0)
Hemoglobin: 12.3 g/dL — ABNORMAL LOW (ref 13.0–17.0)
MCH: 29.2 pg (ref 26.0–34.0)
MCHC: 33.6 g/dL (ref 30.0–36.0)
MCV: 86.9 fL (ref 78.0–100.0)
PLATELETS: 151 10*3/uL (ref 150–400)
RBC: 4.21 MIL/uL — AB (ref 4.22–5.81)
RDW: 14.6 % (ref 11.5–15.5)
WBC: 15.2 10*3/uL — ABNORMAL HIGH (ref 4.0–10.5)

## 2014-07-13 LAB — GLUCOSE, CAPILLARY
GLUCOSE-CAPILLARY: 187 mg/dL — AB (ref 70–99)
Glucose-Capillary: 188 mg/dL — ABNORMAL HIGH (ref 70–99)
Glucose-Capillary: 183 mg/dL — ABNORMAL HIGH (ref 70–99)
Glucose-Capillary: 204 mg/dL — ABNORMAL HIGH (ref 70–99)

## 2014-07-13 LAB — TSH: TSH: 5.15 u[IU]/mL — ABNORMAL HIGH (ref 0.350–4.500)

## 2014-07-13 MED ORDER — METHYLPREDNISOLONE SODIUM SUCC 40 MG IJ SOLR
40.0000 mg | Freq: Four times a day (QID) | INTRAMUSCULAR | Status: DC
  Administered 2014-07-13 – 2014-07-14 (×3): 40 mg via INTRAVENOUS
  Filled 2014-07-13 (×8): qty 1

## 2014-07-13 MED ORDER — IPRATROPIUM-ALBUTEROL 0.5-2.5 (3) MG/3ML IN SOLN
3.0000 mL | Freq: Four times a day (QID) | RESPIRATORY_TRACT | Status: DC
  Administered 2014-07-13 – 2014-07-14 (×5): 3 mL via RESPIRATORY_TRACT
  Filled 2014-07-13 (×5): qty 3

## 2014-07-13 MED ORDER — IPRATROPIUM-ALBUTEROL 0.5-2.5 (3) MG/3ML IN SOLN
3.0000 mL | Freq: Two times a day (BID) | RESPIRATORY_TRACT | Status: DC
Start: 2014-07-13 — End: 2014-07-13

## 2014-07-13 MED ORDER — ALBUTEROL SULFATE (2.5 MG/3ML) 0.083% IN NEBU: 2.5000 mg | INHALATION_SOLUTION | RESPIRATORY_TRACT | Status: DC | PRN

## 2014-07-13 NOTE — Progress Notes (Signed)
Pt stated that he would probably sleep in the chair tonight and did not wish to, nor need to wear his CPAP while sleeping in the chair.  This was reinforced to me by the Pt's daughter.  I informed the Pt and the Pt's daughter that should he change his mind I would be happy to come back and set him up. Pt refused CPAP QHS

## 2014-07-13 NOTE — Progress Notes (Addendum)
Patient ID: [name], male   DOB: [birth year], 80 y.o.   MRN: [id number]  [facility] PROGRESS NOTE  [name]:[id number] DOB: [birth year] DOA: Jul 17, 2014 PCP: [name], MD  Brief narrative: 80 y.o. male with a past medical history of chronic obstructive pulmonary disease, type 2 diabetes mellitus, hypertension, who presented to the emergency room with progressively worsening productive cough of yellow sputum and shortness of breath present with exertion and at rest. This has been associated with subjective fevers and chills, malaise and poor oral intake. Patient has denied chest pain, hemoptysis, abdominal or urinary concerns. Patient did reports sick contact while at his church last Sunday after which he noted to be getting sicker.  In the emergency department, patient was tachypneic with a respiratory rate in 30s, temperature 101 Fahrenheit, chest x-ray notable for chronic diffuse peribronchial thickening. Patient has received bronchodilators and reported significant improvement.   Assessment and Plan:    Principal Problem:   Acute hypoxic respiratory failure - Secondary to acute on chronic bronchitis with possible pneumonia, COPD - Patient clinically stable this morning, maintaining oxygen saturations at target range - Continue Zithromax and Rocephin day 2 - Followup on sputum analysis - Continue bronchodilators scheduled and as needed   CAP (community acquired pneumonia) - Antibiotics as noted above - Continue to provide oxygen as needed   Acute on chronic COPD - Clinically stable this morning, maintaining oxygen saturations at target range - Continue Solu-Medrol but taper down, continue bronchodilators scheduled and as needed - antibiotics as noted above Active Problems:   HYPERTENSION, BENIGN - Slightly above target range this morning - Continue metoprolol and Imdur as per home medical regimen   Coronary artery disease - Clinically stable, continue  aspirin   Chronic diastolic CHF (congestive heart failure) - Patient is euvolemic on exam no signs of volume overload - Since patient tolerating regular diet well, we'll stop IV fluids - Weight is 207 pounds this morning, continue to monitor daily weights and strict input and output    Diabetes type 2, controlled - Continue metformin and glimepiride as per home medical regimen   Hypothyroidism - Stable, continue Synthroid   Acute blood loss anemia - Drop in hemoglobin since admission, likely dilutional, no signs of active bleeding - Repeat CBC in the morning   Hyperlipidemia - Continue statin  DVT prophylaxis  Lovenox SQ while pt is in hospital  Code Status: Full Family Communication: Pt at bedside Disposition Plan: Home when medically stable   IV Access:   Peripheral IV Procedures and diagnostic studies:   Dg Chest Port 1 View  17-Jul-2014  Chronic diffuse mild peribronchial thickening may be secondary to patient's reported COPD. No acute superimposed abnormality identified.    Medical Consultants:   None Other Consultants:   Physical therapy  Anti-Infectives:   Zithromax 2014/07/17 --> Rocephin 07/17/2014 -->  Faye Ramsay, MD  [facility] Pager [phone]  If 7PM-7AM, please contact night-coverage [email or web address] Password [email or web address] 07/13/2014, 10:15 AM   LOS: 1 day   HPI/Subjective: No events overnight.   Objective: Filed Vitals:   07/17/2014 2143 07-17-2014 2337 07/13/14 0356 07/13/14 0616  BP:    155/64  Pulse:    88  Temp:    98.4 F (36.9 C)  TempSrc:    Oral  Resp:    18  Height:      Weight:      SpO2: 95% 94% 97% 100%    Intake/Output Summary (Last 24 hours) at 07/13/14 1015 Last data filed  at 07/13/14 0900  Gross per 24 hour  Intake   1140 ml  Output   1625 ml  Net   -485 ml    Exam:   General:  Pt is alert, follows commands appropriately, not in acute distress  Cardiovascular: Regular rate and rhythm, no rubs, no gallops  Respiratory: Clear to  auscultation bilaterally, diminished breath sounds at bases with minimal rhonchi  Abdomen: Soft, non tender, non distended, bowel sounds present, no guarding  Extremities: No edema, pulses DP and PT palpable bilaterally  Neuro: Grossly nonfocal  Data Reviewed: Basic Metabolic Panel:  Recent Labs Lab 07/12/14 1457 07/13/14 0432  NA 138 139  K 4.1 4.1  CL 97 101  CO2 26 25  GLUCOSE 151* 200*  BUN 19 19  CREATININE 1.07 1.02  CALCIUM 9.6 9.0   CBC:  Recent Labs Lab 07/12/14 1457 07/12/14 1542 07/13/14 0432  WBC 12.9*  --  15.2*  NEUTROABS  --  11.5*  --   HGB 13.2  --  12.3*  HCT 39.8  --  36.6*  MCV 87.5  --  86.9  PLT 144*  --  151   CBG:  Recent Labs Lab 07/12/14 2031 07/12/14 2323 07/13/14 0801  GLUCAP 245* 264* 187*    Recent Results (from the past 240 hour(s))  CULTURE, BLOOD (ROUTINE X 2)     Status: None   Collection Time    07/12/14  3:41 PM      Result Value Ref Range Status   Specimen Description BLOOD BLOOD RIGHT FOREARM   Final   Special Requests BOTTLES DRAWN AEROBIC AND ANAEROBIC 3 ML   Final   Culture  Setup Time     Final   Value: 07/12/2014 22:31     Performed at [facility]   Culture     Final   Value:        BLOOD CULTURE RECEIVED NO GROWTH TO DATE CULTURE WILL BE HELD FOR 5 DAYS BEFORE ISSUING A FINAL NEGATIVE REPORT     Performed at [facility]   Report Status PENDING   Incomplete  CULTURE, BLOOD (ROUTINE X 2)     Status: None   Collection Time    07/12/14  3:42 PM      Result Value Ref Range Status   Specimen Description BLOOD RIGHT ANTECUBITAL   Final   Special Requests BOTTLES DRAWN AEROBIC AND ANAEROBIC 3 ML   Final   Culture  Setup Time     Final   Value: 07/12/2014 22:33     Performed at [facility]   Culture     Final   Value:        BLOOD CULTURE RECEIVED NO GROWTH TO DATE CULTURE WILL BE HELD FOR 5 DAYS BEFORE ISSUING A FINAL NEGATIVE REPORT     Performed at [facility]   Report  Status PENDING   Incomplete     Scheduled Meds: . aspirin EC  81 mg Oral Daily  . azithromycin  500 mg Intravenous Q24H  . budesonide-formoterol  2 puff Inhalation BID  . cefTRIAXone IV  1 g Intravenous Q24H  . enoxaparin (LOVENOX) injection  40 mg Subcutaneous Q24H  . ezetimibe-simvastatin  0.5 tablet Oral Daily  . glimepiride  2 mg Oral QAC breakfast  . insulin aspart  0-15 Units Subcutaneous TID WC  . insulin aspart  0-5 Units Subcutaneous QHS  . ipratropium-albuterol  3 mL Nebulization BID  . isosorbide mononitrate  60 mg Oral Daily  . levothyroxine  200 mcg Oral QAC breakfast  . metFORMIN  500 mg Oral BID WC  . methylPREDNISolone (SOLU-MEDROL) injection  60 mg Intravenous Q6H  . metoprolol tartrate  25 mg Oral Daily   Continuous Infusions:

## 2014-07-13 NOTE — Progress Notes (Signed)
Pt refused CPAP QHS.  Pt understands the benefits but states that he will likely sleep in the chair and doesn't want to wear the mask.

## 2014-07-14 DIAGNOSIS — J189 Pneumonia, unspecified organism: Principal | ICD-10-CM

## 2014-07-14 LAB — CBC
HEMATOCRIT: 37.5 % — AB (ref 39.0–52.0)
HEMOGLOBIN: 11.9 g/dL — AB (ref 13.0–17.0)
MCH: 28 pg (ref 26.0–34.0)
MCHC: 31.7 g/dL (ref 30.0–36.0)
MCV: 88.2 fL (ref 78.0–100.0)
Platelets: 150 10*3/uL (ref 150–400)
RBC: 4.25 MIL/uL (ref 4.22–5.81)
RDW: 14.8 % (ref 11.5–15.5)
WBC: 18.8 10*3/uL — AB (ref 4.0–10.5)

## 2014-07-14 LAB — GLUCOSE, CAPILLARY: Glucose-Capillary: 173 mg/dL — ABNORMAL HIGH (ref 70–99)

## 2014-07-14 LAB — BASIC METABOLIC PANEL
Anion gap: 14 (ref 5–15)
BUN: 23 mg/dL (ref 6–23)
CHLORIDE: 103 meq/L (ref 96–112)
CO2: 26 meq/L (ref 19–32)
Calcium: 9.4 mg/dL (ref 8.4–10.5)
Creatinine, Ser: 1.06 mg/dL (ref 0.50–1.35)
GFR calc Af Amer: 73 mL/min — ABNORMAL LOW (ref >90)
GFR calc non Af Amer: 63 mL/min — ABNORMAL LOW (ref >90)
GLUCOSE: 175 mg/dL — AB (ref 70–99)
POTASSIUM: 4.2 meq/L (ref 3.7–5.3)
Sodium: 143 mEq/L (ref 137–147)

## 2014-07-14 MED ORDER — PREDNISONE 50 MG PO TABS
50.0000 mg | ORAL_TABLET | Freq: Every day | ORAL | Status: DC
  Administered 2014-07-14: 50 mg via ORAL
  Filled 2014-07-14 (×2): qty 1

## 2014-07-14 MED ORDER — ALBUTEROL SULFATE (2.5 MG/3ML) 0.083% IN NEBU: 2.5000 mg | INHALATION_SOLUTION | RESPIRATORY_TRACT | Status: DC | PRN

## 2014-07-14 MED ORDER — OXYCODONE HCL 5 MG PO TABS: 5.0000 mg | ORAL_TABLET | ORAL | Status: DC | PRN

## 2014-07-14 MED ORDER — LEVOFLOXACIN 500 MG PO TABS
500.0000 mg | ORAL_TABLET | Freq: Every day | ORAL | Status: DC
  Administered 2014-07-14: 500 mg via ORAL
  Filled 2014-07-14: qty 1

## 2014-07-14 MED ORDER — LEVOFLOXACIN 500 MG PO TABS: 500.0000 mg | ORAL_TABLET | Freq: Every day | ORAL | Status: DC

## 2014-07-14 MED ORDER — PREDNISONE 10 MG PO TABS: ORAL_TABLET | ORAL | Status: DC

## 2014-07-14 MED ORDER — GUAIFENESIN-DM 100-10 MG/5ML PO SYRP: 5.0000 mL | ORAL_SOLUTION | ORAL | Status: DC | PRN

## 2014-07-14 NOTE — Discharge Instructions (Signed)

## 2014-07-14 NOTE — Discharge Summary (Signed)
Physician Discharge Summary  [name] KGU:[id number] DOB: 04/13/1930 DOA: 07/12/2014  PCP: Eddie Neer, MD  Admit date: 07/12/2014 Discharge date: 07/14/2014  Recommendations for Outpatient Follow-up:  1. Pt will need to follow up with PCP in 2-3 weeks post discharge 2. Please obtain BMP to evaluate electrolytes and kidney function 3. Please also check CBC to evaluate Hg and Hct levels 4. Continue Levaquin upon discharge for 7 more days 5. Also continue Prednisone taper pack as noted below   Discharge Diagnoses:  Principal Problem:   CAP (community acquired pneumonia) Active Problems:   HYPERLIPIDEMIA   HYPERTENSION, BENIGN   Coronary artery disease   Chronic diastolic CHF (congestive heart failure)   COPD exacerbation   Acute respiratory failure   COPD (chronic obstructive pulmonary disease)  Discharge Condition: Stable  Diet recommendation: Heart healthy diet discussed in details   Brief narrative:  80 y.o. male with a past medical history of chronic obstructive pulmonary disease, type 2 diabetes mellitus, hypertension, who presented to the emergency room with progressively worsening productive cough of yellow sputum and shortness of breath present with exertion and at rest. This has been associated with subjective fevers and chills, malaise and poor oral intake. Patient has denied chest pain, hemoptysis, abdominal or urinary concerns. Patient did reports sick contact while at his church last Sunday after which he noted to be getting sicker.   In the emergency department, patient was tachypneic with a respiratory rate in 30s, temperature 101 Fahrenheit, chest x-ray notable for chronic diffuse peribronchial thickening. Patient has received bronchodilators and reported significant improvement.   Assessment and Plan:   Principal Problem:  Acute hypoxic respiratory failure  - Secondary to acute on chronic bronchitis with possible pneumonia, COPD  - Patient clinically  stable this morning, maintaining oxygen saturations at target range  - Continue Zithromax and Rocephin day 3, transition to Levaquin upon discharge for 7 more days  - Continue bronchodilators as needed  - continue taper with prednisone as noted below  CAP (community acquired pneumonia)  - Antibiotics as noted above  - Continue to provide oxygen as needed  Acute on chronic COPD  - Clinically stable this morning, maintaining oxygen saturations at target range  - management as noted above with prednisone taper, BD's as needed  - antibiotics as noted above  Active Problems:  HYPERTENSION, BENIGN  - Slightly above target range this morning  - Continue metoprolol and Imdur as per home medical regimen  Coronary artery disease  - Clinically stable, continue aspirin  Chronic diastolic CHF (congestive heart failure)  - Patient is euvolemic on exam no signs of volume overload  - Since patient tolerating regular diet well, we'll stop IV fluids  - Weight is 207 pounds this morning, stable over the past 24 hours  Diabetes type 2, controlled  - Continue metformin and glimepiride as per home medical regimen  Hypothyroidism  - Stable, continue Synthroid  Acute blood loss anemia  - Drop in hemoglobin since admission, likely dilutional, no signs of active bleeding  Hyperlipidemia  - Continue statin   Code Status: Full  Family Communication: Pt at bedside  Disposition Plan: Home   IV Access:   Peripheral IV Procedures and diagnostic studies:   Dg Chest Port 1 View 07/12/2014 Chronic diffuse mild peribronchial thickening may be secondary to patient's reported COPD. No acute superimposed abnormality identified.  Medical Consultants:   None Other Consultants:   Physical therapy  Anti-Infectives:   Zithromax 07/12/2014 --> 10/01 Rocephin 07/12/2014 --> 10/01  Levaquin 10/01 --> 7 more days post discharge    Discharge Exam: Filed Vitals:   07/14/14 1023  BP: 152/87  Pulse: 95  Temp:    Resp:    Filed Vitals:   07/13/14 1402 07/13/14 2026 07/14/14 0634 07/14/14 1023  BP: 137/52 132/65 141/83 152/87  Pulse: 122 88 104 95  Temp: 98.5 F (36.9 C) 98.3 F (36.8 C) 98.3 F (36.8 C)   TempSrc: Oral Oral Oral   Resp: 18 18 16    Height:      Weight:   94 kg (207 lb 3.7 oz)   SpO2: 95% 100% 96%     General: Pt is alert, follows commands appropriately, not in acute distress Cardiovascular: Regular rate and rhythm, S1/S2 +, no murmurs, no rubs, no gallops Respiratory: Clear to auscultation bilaterally, no wheezing, no crackles, no rhonchi Abdominal: Soft, non tender, non distended, bowel sounds +, no guarding Extremities: no edema, no cyanosis, pulses palpable bilaterally DP and PT Neuro: Grossly nonfocal  Discharge Instructions  Discharge Instructions   Diet - low sodium heart healthy    Complete by:  As directed      Increase activity slowly    Complete by:  As directed             Medication List         albuterol (2.5 MG/3ML) 0.083% nebulizer solution  Commonly known as:  PROVENTIL  Take 3 mLs (2.5 mg total) by nebulization every 4 (four) hours as needed for wheezing or shortness of breath.     albuterol 108 (90 BASE) MCG/ACT inhaler  Commonly known as:  PROVENTIL HFA;VENTOLIN HFA  Inhale 2 puffs into the lungs every 6 (six) hours as needed for wheezing or shortness of breath (wheezing).     aspirin EC 81 MG tablet  Take 81 mg by mouth daily.     budesonide-formoterol 80-4.5 MCG/ACT inhaler  Commonly known as:  SYMBICORT  Inhale 2 puffs into the lungs 2 (two) times daily as needed (wheezing). FOR WHEEZING     ezetimibe-simvastatin 10-20 MG per tablet  Commonly known as:  VYTORIN  Take 0.5 tablets by mouth daily.     glimepiride 2 MG tablet  Commonly known as:  AMARYL  Take 2 mg by mouth daily.     guaiFENesin-dextromethorphan 100-10 MG/5ML syrup  Commonly known as:  ROBITUSSIN DM  Take 5 mLs by mouth every 4 (four) hours as needed for cough.      isosorbide mononitrate 60 MG 24 hr tablet  Commonly known as:  IMDUR  Take 60 mg by mouth daily.     levofloxacin 500 MG tablet  Commonly known as:  LEVAQUIN  Take 1 tablet (500 mg total) by mouth daily.     levothyroxine 200 MCG tablet  Commonly known as:  SYNTHROID, LEVOTHROID  Take 200 mcg by mouth daily before breakfast.     loperamide 2 MG tablet  Commonly known as:  IMODIUM A-D  Take 2 mg by mouth 4 (four) times daily as needed (loose stools). For diarrhea.     metFORMIN 500 MG tablet  Commonly known as:  GLUCOPHAGE  Take 500 mg by mouth 2 (two) times daily.     metoprolol tartrate 25 MG tablet  Commonly known as:  LOPRESSOR  Take 25 mg by mouth daily.     nitroGLYCERIN 0.4 MG SL tablet  Commonly known as:  NITROSTAT  Place 0.4 mg under the tongue every 5 (five) minutes x 3 doses as  needed. For chest pain.     NON FORMULARY  Sleeps with CPAP Machine     omeprazole 20 MG capsule  Commonly known as:  PRILOSEC  Take 20 mg by mouth daily.     oxyCODONE 5 MG immediate release tablet  Commonly known as:  Oxy IR/ROXICODONE  Take 1 tablet (5 mg total) by mouth every 4 (four) hours as needed for moderate pain.     predniSONE 10 MG tablet  Commonly known as:  DELTASONE  Take 50 mg tablet today and taper down by 10 mg daily until completed           Follow-up Information   Schedule an appointment as soon as possible for a visit with Eddie Neer, MD.   Specialty:  Family Medicine   Contact information:   [address] [place] [address] [phone]       Follow up with Eddie Ramsay, MD. (As needed call my cell phone [phone])    Specialty:  Internal Medicine   Contact information:   [address] [place] [address] [phone]        The results of significant diagnostics from this hospitalization (including imaging, microbiology, ancillary and laboratory) are listed below for reference.      Microbiology: Recent Results (from the past 240 hour(s))  CULTURE, BLOOD (ROUTINE X 2)     Status: None   Collection Time    07/12/14  3:41 PM      Result Value Ref Range Status   Specimen Description BLOOD BLOOD RIGHT FOREARM   Final   Special Requests BOTTLES DRAWN AEROBIC AND ANAEROBIC 3 ML   Final   Culture  Setup Time     Final   Value: 07/12/2014 22:31     Performed at [facility]   Culture     Final   Value:        BLOOD CULTURE RECEIVED NO GROWTH TO DATE CULTURE WILL BE HELD FOR 5 DAYS BEFORE ISSUING A FINAL NEGATIVE REPORT     Performed at [facility]   Report Status PENDING   Incomplete  CULTURE, BLOOD (ROUTINE X 2)     Status: None   Collection Time    07/12/14  3:42 PM      Result Value Ref Range Status   Specimen Description BLOOD RIGHT ANTECUBITAL   Final   Special Requests BOTTLES DRAWN AEROBIC AND ANAEROBIC 3 ML   Final   Culture  Setup Time     Final   Value: 07/12/2014 22:33     Performed at [facility]   Culture     Final   Value:        BLOOD CULTURE RECEIVED NO GROWTH TO DATE CULTURE WILL BE HELD FOR 5 DAYS BEFORE ISSUING A FINAL NEGATIVE REPORT     Performed at [facility]   Report Status PENDING   Incomplete  URINE CULTURE     Status: None   Collection Time    07/12/14  5:02 PM      Result Value Ref Range Status   Specimen Description URINE, CLEAN CATCH   Final   Special Requests NONE   Final   Culture  Setup Time     Final   Value: 07/12/2014 22:44     Performed at [facility]     Final   Value: NO GROWTH     Performed at [facility]   Culture     Final   Value: NO GROWTH     Performed at [facility]   Report Status 07/13/2014 FINAL   Final     Labs: Basic Metabolic Panel:  Recent Labs Lab 07/12/14 1457 07/13/14 0432 07/14/14 0414  NA 138 139 143  K 4.1 4.1 4.2  CL 97 101 103  CO2 26 25 26   GLUCOSE 151* 200* 175*  BUN 19 19 23   CREATININE 1.07 1.02  1.06  CALCIUM 9.6 9.0 9.4   CBC:  Recent Labs Lab 07/12/14 1457 07/12/14 1542 07/13/14 0432 07/14/14 0414  WBC 12.9*  --  15.2* 18.8*  NEUTROABS  --  11.5*  --   --   HGB 13.2  --  12.3* 11.9*  HCT 39.8  --  36.6* 37.5*  MCV 87.5  --  86.9 88.2  PLT 144*  --  151 150    BNP (last 3 results)  Recent Labs  07/12/14 1542  PROBNP 31.2   CBG:  Recent Labs Lab 07/13/14 0801 07/13/14 1134 07/13/14 1654 07/13/14 2142 07/14/14 0734  GLUCAP 187* 188* 183* 204* 173*   SIGNED: Time coordinating discharge: Over 30 minutes  Eddie Ramsay, MD  [facility] 07/14/2014, 10:51 AM Pager [phone]  If 7PM-7AM, please contact night-coverage [email or web address] Password [email or web address]

## 2014-07-15 ENCOUNTER — Encounter: Payer: Self-pay | Admitting: Family

## 2014-07-18 ENCOUNTER — Other Ambulatory Visit (HOSPITAL_COMMUNITY): Payer: Commercial Managed Care - HMO

## 2014-07-18 ENCOUNTER — Encounter (HOSPITAL_COMMUNITY): Payer: Self-pay

## 2014-07-18 ENCOUNTER — Ambulatory Visit: Payer: Self-pay | Admitting: Family

## 2014-07-18 LAB — CULTURE, BLOOD (ROUTINE X 2)
CULTURE: NO GROWTH
Culture: NO GROWTH

## 2014-08-02 ENCOUNTER — Encounter: Payer: Self-pay | Admitting: Family

## 2014-08-03 ENCOUNTER — Encounter: Payer: Self-pay | Admitting: Family

## 2014-08-03 ENCOUNTER — Telehealth: Payer: Self-pay | Admitting: Cardiology

## 2014-08-03 ENCOUNTER — Other Ambulatory Visit: Payer: Self-pay

## 2014-08-03 ENCOUNTER — Ambulatory Visit (INDEPENDENT_AMBULATORY_CARE_PROVIDER_SITE_OTHER): Payer: Commercial Managed Care - HMO | Admitting: Family

## 2014-08-03 ENCOUNTER — Ambulatory Visit (HOSPITAL_COMMUNITY)
Admission: RE | Admit: 2014-08-03 | Discharge: 2014-08-03 | Disposition: A | Discharge status: Home / Self Care | Payer: Medicare HMO | Source: Ambulatory Visit | Attending: Family | Admitting: Family

## 2014-08-03 ENCOUNTER — Ambulatory Visit (INDEPENDENT_AMBULATORY_CARE_PROVIDER_SITE_OTHER)
Admission: RE | Admit: 2014-08-03 | Discharge: 2014-08-03 | Disposition: A | Discharge status: Home / Self Care | Payer: Medicare HMO | Source: Ambulatory Visit

## 2014-08-03 VITALS — BP 136/82 | HR 74 | Resp 16 | Ht 64.0 in | Wt 212.0 lb

## 2014-08-03 DIAGNOSIS — Z48812 Encounter for surgical aftercare following surgery on the circulatory system: Secondary | ICD-10-CM | POA: Diagnosis not present

## 2014-08-03 DIAGNOSIS — I70219 Atherosclerosis of native arteries of extremities with intermittent claudication, unspecified extremity: Secondary | ICD-10-CM | POA: Diagnosis not present

## 2014-08-03 DIAGNOSIS — R29898 Other symptoms and signs involving the musculoskeletal system: Secondary | ICD-10-CM

## 2014-08-03 DIAGNOSIS — I739 Peripheral vascular disease, unspecified: Secondary | ICD-10-CM

## 2014-08-03 MED ORDER — METOPROLOL TARTRATE 25 MG PO TABS: 25.0000 mg | ORAL_TABLET | Freq: Every day | ORAL | Status: DC

## 2014-08-03 NOTE — Telephone Encounter (Signed)
Metoprolol refilled for 1 year at [facility].

## 2014-08-03 NOTE — Telephone Encounter (Signed)
New message    Need prior Auth on metoprolol.    Daughter is stating he out of medication.     [facility] [address]  [phone]

## 2014-08-03 NOTE — Progress Notes (Signed)
[facility] HISTORY AND PHYSICAL -PAD  History of Present Illness [name] is a 80 y.o. male patient of Dr. Scot Dock who is s/p left iliac artery stenting in 2008, and  aortogram with bilateral runoff on 12/29/13; impressions are as follows: #1 no significant aortic stenosis. Bilateral common and external iliac arteries are patent without significant stenosis.  #2 bilateral hypogastric artery (internal iliac) occlusion which likely explains the patient's symptoms  #3 bilateral popliteal artery occluded  He has pain in his lateral left hip and left groin after walking variable distances, resolves with rest, denies non healing wounds, denies claudication symptoms in right leg. The patient denies any history of stroke or TIA.  Pt Diabetic: Yes, A1C June, 2015 was 6.6 Pt smoker: former smoker, quit in the 1960's  Pt meds include: Statin :Yes Betablocker: Yes ASA: Yes Other anticoagulants/antiplatelets: no  Past Medical History  Diagnosis Date  . Diabetes mellitus   . Asthma   . Hypertension   . Hypercholesterolemia   . Hypothyroidism   . Anxiety   . COPD (chronic obstructive pulmonary disease)   . GERD (gastroesophageal reflux disease)   . Sleep apnea     severe OSA awaiting CPAP titration  . Peripheral vascular disease 11/2006    s/p left stent  . Shortness of breath   . DVT (deep venous thrombosis)   . Diverticulitis Oct. 2013    bleeding in the past and Effient for his CAD was stopped  . Coronary artery disease     s/p PCI of RCA 03/2009 at which time there was 70% ramus and 90% RCA, cath 01/2011 showed patent stents in RCA with moderate prox disesae, aneurysmal left circ and small 90% ramus s/p PCI, patent LAD EF 55%  . Chronic diastolic CHF (congestive heart failure)     diastolic   . OSA (obstructive sleep apnea)     severe on CPAP  . Obesity (BMI 30-39.9)     Social History History  Substance Use Topics  . Smoking status: Former  Smoker -- 0.50 packs/day for 15 years    Types: Cigarettes, Cigars    Quit date: 10/15/1963  . Smokeless tobacco: Never Used  . Alcohol Use: No    Family History Family History  Problem Relation Age of Onset  . Malignant hyperthermia Neg Hx   . Diabetes Mother   . Hyperlipidemia Mother   . Hypertension Mother   . Heart attack Mother   . Breast cancer Sister   . Diabetes Sister   . Hyperlipidemia Sister   . Hypertension Sister   . Heart attack Sister   . Hyperlipidemia Sister   . Hypertension Sister   . Heart disease Sister     Heart Disease before age 55  . Diabetes Brother   . Asthma Sister     Past Surgical History  Procedure Laterality Date  . Coronary stent placement  2010 and 2012  . Hernia repair  1992    evntral hernia repair  . Esophagogastroduodenoscopy  12/26/2011    Procedure: ESOPHAGOGASTRODUODENOSCOPY (EGD);  Surgeon: Garlan Fair, MD;  Location: [facility];  Service: Endoscopy;  Laterality: N/A;  . Balloon dilation  12/26/2011    Procedure: BALLOON DILATION;  Surgeon: Garlan Fair, MD;  Location: [facility];  Service: Endoscopy;  Laterality: N/A;  . Esophagogastroduodenoscopy  08/13/2012    Procedure: ESOPHAGOGASTRODUODENOSCOPY (EGD);  Surgeon: Arta Silence, MD;  Location: [facility];  Service: Endoscopy;  Laterality: Left;  . Colonoscopy  08/14/2012    Procedure: COLONOSCOPY;  Surgeon: Arta Silence, MD;  Location: [facility];  Service: Endoscopy;  Laterality: N/A;    Allergies  Allergen Reactions  . Clopidogrel Bisulfate Itching    Current Outpatient Prescriptions  Medication Sig Dispense Refill  . albuterol (PROVENTIL HFA;VENTOLIN HFA) 108 (90 BASE) MCG/ACT inhaler Inhale 2 puffs into the lungs every 6 (six) hours as needed for wheezing or shortness of breath (wheezing).       Marland Kitchen albuterol (PROVENTIL) (2.5 MG/3ML) 0.083% nebulizer solution Take 3 mLs (2.5 mg total) by nebulization every 4 (four) hours as needed for wheezing or  shortness of breath.  75 mL  12  . aspirin EC 81 MG tablet Take 81 mg by mouth daily.      . budesonide-formoterol (SYMBICORT) 80-4.5 MCG/ACT inhaler Inhale 2 puffs into the lungs 2 (two) times daily as needed (wheezing). FOR WHEEZING      . ezetimibe-simvastatin (VYTORIN) 10-20 MG per tablet Take 0.5 tablets by mouth daily.  30 tablet  6  . glimepiride (AMARYL) 2 MG tablet Take 2 mg by mouth daily.        Marland Kitchen guaiFENesin-dextromethorphan (ROBITUSSIN DM) 100-10 MG/5ML syrup Take 5 mLs by mouth every 4 (four) hours as needed for cough.  118 mL  0  . isosorbide mononitrate (IMDUR) 60 MG 24 hr tablet Take 60 mg by mouth daily.      Marland Kitchen levofloxacin (LEVAQUIN) 500 MG tablet Take 1 tablet (500 mg total) by mouth daily.  7 tablet  0  . levothyroxine (SYNTHROID, LEVOTHROID) 200 MCG tablet Take 200 mcg by mouth daily before breakfast.      . loperamide (IMODIUM A-D) 2 MG tablet Take 2 mg by mouth 4 (four) times daily as needed (loose stools). For diarrhea.      . metFORMIN (GLUCOPHAGE) 500 MG tablet Take 500 mg by mouth 2 (two) times daily.       . metoprolol tartrate (LOPRESSOR) 25 MG tablet Take 25 mg by mouth daily.       . nitroGLYCERIN (NITROSTAT) 0.4 MG SL tablet Place 0.4 mg under the tongue every 5 (five) minutes x 3 doses as needed. For chest pain.      . NON FORMULARY Sleeps with CPAP Machine      . omeprazole (PRILOSEC) 20 MG capsule Take 20 mg by mouth daily.      Marland Kitchen oxyCODONE (OXY IR/ROXICODONE) 5 MG immediate release tablet Take 1 tablet (5 mg total) by mouth every 4 (four) hours as needed for moderate pain.  45 tablet  0  . predniSONE (DELTASONE) 10 MG tablet Take 50 mg tablet today and taper down by 10 mg daily until completed  15 tablet  0   No current facility-administered medications for this visit.    ROS: See HPI for pertinent positives and negatives.   Physical Examination  Filed Vitals:   08/03/14 1036  BP: 136/82  Pulse: 74  Resp: 16  Height: 5\' 4"  (1.626 m)  Weight: 212 lb  (96.163 kg)  SpO2: 96%   Body mass index is 36.37 kg/(m^2).  General: A&O x 3, WDWN, obese male. Gait: normal Eyes: PERRLA. Pulmonary: CTAB, without wheezes , rales or rhonchi. Cardiac: regular Rythm , without detected murmur.         Carotid Bruits Right Left   Negative Negative  Aorta is not palpable. Radial pulses: are 2+ palpable and =  VASCULAR EXAM: Extremities without ischemic changes  without Gangrene; without open wounds.                                                                                                          LE Pulses Right Left       FEMORAL  1+ palpable  1+ palpable        POPLITEAL  not palpable   not palpable       POSTERIOR TIBIAL  not palpable   not palpable        DORSALIS PEDIS      ANTERIOR TIBIAL not palpable  not palpable    Abdomen: soft, NT, no masses. Skin: no rashes, no ulcers noted. Musculoskeletal: no muscle wasting or atrophy.  Neurologic: A&O X 3; Appropriate Affect ; SENSATION: normal; MOTOR FUNCTION:  moving all extremities equally, motor strength 5/5 throughout. Speech is fluent/normal. CN 2-12 intact.    Non-Invasive Vascular Imaging: DATE: 08/03/2014 ILIAC ARTERY STENT EVALUATION    INDICATION: Follow up stent    PREVIOUS INTERVENTION(S): Left common iliac artery stent and angioplasty 2008    DUPLEX EXAM:     RIGHT  LEFT   Peak Systolic Velocity (cm/s) Ratio (if abnormal) Waveform  Peak Systolic Velocity (cm/s) Ratio (if abnormal) Waveform     Aorta - Distal 45  B     Artery - Proximal to Stent 190  B     Stent - Proximal 137  B     Stent - Mid 132  B     Stent - Distal 161  B     Artery - Distal to Stent 119  B  .84 Today's ABI / TBI .79  .79 Previous ABI / TBI (05/21/13  ) .69    Waveform:    M - Monophasic       B - Biphasic       T - Triphasic  If Ankle Brachial Index (ABI) or Toe Brachial Index (TBI) performed, please see complete report     ADDITIONAL FINDINGS: Right common  femoral artery and left common femoral artery have triphasic waveforms ( 83 cms and 150 cms).    IMPRESSION: 1. Technically difficult exam due to body habitus and bowel gas. 2. The left common iliac artery stent appears patent.    Compared to the previous exam:       ASSESSMENT: [name] is a 80 y.o. male who is s/p left common iliac artery stent and angioplasty in 2008. He has pain in his lateral left hip and left groin after walking variable distances, resolves with rest, denies non healing wounds, denies claudication symptoms in right leg. Aortogram with bilateral runoff on 12/29/13 demonstrated bilateral hypogastric artery (internal iliac) occlusion which likely explains the patient's symptoms, however, he does not have right lower extremitiy claudication symptoms. Today's left iliac artery stent evaluation demonstrates that the left common iliac artery stent appears patent, technically difficult exam due to body habitus and bowel gas, right common femoral artery and left common femoral artery have triphasic waveforms (  83 cms and 150 cms). Bilateral ABI's have improved.   PLAN:  Graduated walking program, maximal medical management. I discussed in depth with the patient the nature of atherosclerosis, and emphasized the importance of maximal medical management including strict control of blood pressure, blood glucose, and lipid levels, obtaining regular exercise, and continued cessation of smoking.  The patient is aware that without maximal medical management the underlying atherosclerotic disease process will progress, limiting the benefit of any interventions.  Based on the patient's vascular studies and examination, pt will return to clinic in 1 year with ABI's and left LE arterial Duplex.  The patient was given information about PAD including signs, symptoms, treatment, what symptoms should prompt the patient to seek immediate medical care, and risk reduction measures to  take.  Clemon Chambers, RN, MSN, FNP-C [facility] Phone: [phone]  Clinic MD: [facility]  08/03/2014 9:24 AM

## 2014-08-03 NOTE — Patient Instructions (Signed)
Peripheral Vascular Disease Peripheral Vascular Disease (PVD), also called Peripheral Arterial Disease (PAD), is a circulation problem caused by cholesterol (atherosclerotic plaque) deposits in the arteries. PVD commonly occurs in the lower extremities (legs) but it can occur in other areas of the body, such as your arms. The cholesterol buildup in the arteries reduces blood flow which can cause pain and other serious problems. The presence of PVD can place a person at risk for Coronary Artery Disease (CAD).  CAUSES  Causes of PVD can be many. It is usually associated with more than one risk factor such as:   High Cholesterol.  Smoking.  Diabetes.  Lack of exercise or inactivity.  High blood pressure (hypertension).  Obesity.  Family history. SYMPTOMS   When the lower extremities are affected, patients with PVD may experience:  Leg pain with exertion or physical activity. This is called INTERMITTENT CLAUDICATION. This may present as cramping or numbness with physical activity. The location of the pain is associated with the level of blockage. For example, blockage at the abdominal level (distal abdominal aorta) may result in buttock or hip pain. Lower leg arterial blockage may result in calf pain.  As PVD becomes more severe, pain can develop with less physical activity.  In people with severe PVD, leg pain may occur at rest.  Other PVD signs and symptoms:  Leg numbness or weakness.  Coldness in the affected leg or foot, especially when compared to the other leg.  A change in leg color.  Patients with significant PVD are more prone to ulcers or sores on toes, feet or legs. These may take longer to heal or may reoccur. The ulcers or sores can become infected.  If signs and symptoms of PVD are ignored, gangrene may occur. This can result in the loss of toes or loss of an entire limb.  Not all leg pain is related to PVD. Other medical conditions can cause leg pain such  as:  Blood clots (embolism) or Deep Vein Thrombosis.  Inflammation of the blood vessels (vasculitis).  Spinal stenosis. DIAGNOSIS  Diagnosis of PVD can involve several different types of tests. These can include:  Pulse Volume Recording Method (PVR). This test is simple, painless and does not involve the use of X-rays. PVR involves measuring and comparing the blood pressure in the arms and legs. An ABI (Ankle-Brachial Index) is calculated. The normal ratio of blood pressures is 1. As this number becomes smaller, it indicates more severe disease.  < 0.95 - indicates significant narrowing in one or more leg vessels.  <0.8 - there will usually be pain in the foot, leg or buttock with exercise.  <0.4 - will usually have pain in the legs at rest.  <0.25 - usually indicates limb threatening PVD.  Doppler detection of pulses in the legs. This test is painless and checks to see if you have a pulses in your legs/feet.  A dye or contrast material (a substance that highlights the blood vessels so they show up on x-ray) may be given to help your caregiver better see the arteries for the following tests. The dye is eliminated from your body by the kidney's. Your caregiver may order blood work to check your kidney function and other laboratory values before the following tests are performed:  Magnetic Resonance Angiography (MRA). An MRA is a picture study of the blood vessels and arteries. The MRA machine uses a large magnet to produce images of the blood vessels.  Computed Tomography Angiography (CTA). A CTA   is a specialized x-ray that looks at how the blood flows in your blood vessels. An IV may be inserted into your arm so contrast dye can be injected.  Angiogram. Is a procedure that uses x-rays to look at your blood vessels. This procedure is minimally invasive, meaning a small incision (cut) is made in your groin. A small tube (catheter) is then inserted into the artery of your groin. The catheter  is guided to the blood vessel or artery your caregiver wants to examine. Contrast dye is injected into the catheter. X-rays are then taken of the blood vessel or artery. After the images are obtained, the catheter is taken out. TREATMENT  Treatment of PVD involves many interventions which may include:  Lifestyle changes:  Quitting smoking.  Exercise.  Following a low fat, low cholesterol diet.  Control of diabetes.  Foot care is very important to the PVD patient. Good foot care can help prevent infection.  Medication:  Cholesterol-lowering medicine.  Blood pressure medicine.  Anti-platelet drugs.  Certain medicines may reduce symptoms of Intermittent Claudication.  Interventional/Surgical options:  Angioplasty. An Angioplasty is a procedure that inflates a balloon in the blocked artery. This opens the blocked artery to improve blood flow.  Stent Implant. A wire mesh tube (stent) is placed in the artery. The stent expands and stays in place, allowing the artery to remain open.  Peripheral Bypass Surgery. This is a surgical procedure that reroutes the blood around a blocked artery to help improve blood flow. This type of procedure may be performed if Angioplasty or stent implants are not an option. SEEK IMMEDIATE MEDICAL CARE IF:   You develop pain or numbness in your arms or legs.  Your arm or leg turns cold, becomes blue in color.  You develop redness, warmth, swelling and pain in your arms or legs. MAKE SURE YOU:   Understand these instructions.  Will watch your condition.  Will get help right away if you are not doing well or get worse. Document Released: 11/07/2004 Document Revised: 12/23/2011 Document Reviewed: 10/04/2008 [employer] Patient Information 2015 [employer]. This information is not intended to replace advice given to you by your health care provider. Make sure you discuss any questions you have with your health care provider.   Venous Stasis or  Chronic Venous Insufficiency Chronic venous insufficiency, also called venous stasis, is a condition that affects the veins in the legs. The condition prevents blood from being pumped through these veins effectively. Blood may no longer be pumped effectively from the legs back to the heart. This condition can range from mild to severe. With proper treatment, you should be able to continue with an active life. CAUSES  Chronic venous insufficiency occurs when the vein walls become stretched, weakened, or damaged or when valves within the vein are damaged. Some common causes of this include:  High blood pressure inside the veins (venous hypertension).  Increased blood pressure in the leg veins from long periods of sitting or standing.  A blood clot that blocks blood flow in a vein (deep vein thrombosis).  Inflammation of a superficial vein (phlebitis) that causes a blood clot to form. RISK FACTORS Various things can make you more likely to develop chronic venous insufficiency, including:  Family history of this condition.  Obesity.  Pregnancy.  Sedentary lifestyle.  Smoking.  Jobs requiring long periods of standing or sitting in one place.  Being a certain age. Women in their 40s and 50s and men in their 70s are more   likely to develop this condition. SIGNS AND SYMPTOMS  Symptoms may include:   Varicose veins.  Skin breakdown or ulcers.  Reddened or discolored skin on the leg.  Brown, smooth, tight, and painful skin just above the ankle, usually on the inside surface (lipodermatosclerosis).  Swelling. DIAGNOSIS  To diagnose this condition, your health care provider will take a medical history and do a physical exam. The following tests may be ordered to confirm the diagnosis:  Duplex ultrasound--A procedure that produces a picture of a blood vessel and nearby organs and also provides information on blood flow through the blood vessel.  Plethysmography--A procedure that tests  blood flow.  A venogram, or venography--A procedure used to look at the veins using X-ray and dye. TREATMENT The goals of treatment are to help you return to an active life and to minimize pain or disability. Treatment will depend on the severity of the condition. Medical procedures may be needed for severe cases. Treatment options may include:   Use of compression stockings. These can help with symptoms and lower the chances of the problem getting worse, but they do not cure the problem.  Sclerotherapy--A procedure involving an injection of a material that "dissolves" the damaged veins. Other veins in the network of blood vessels take over the function of the damaged veins.  Surgery to remove the vein or cut off blood flow through the vein (vein stripping or laser ablation surgery).  Surgery to repair a valve. HOME CARE INSTRUCTIONS   Wear compression stockings as directed by your health care provider.  Only take over-the-counter or prescription medicines for pain, discomfort, or fever as directed by your health care provider.  Follow up with your health care provider as directed. SEEK MEDICAL CARE IF:   You have redness, swelling, or increasing pain in the affected area.  You see a red streak or line that extends up or down from the affected area.  You have a breakdown or loss of skin in the affected area, even if the breakdown is small.  You have an injury to the affected area. SEEK IMMEDIATE MEDICAL CARE IF:   You have an injury and open wound in the affected area.  Your pain is severe and does not improve with medicine.  You have sudden numbness or weakness in the foot or ankle below the affected area, or you have trouble moving your foot or ankle.  You have a fever or persistent symptoms for more than 2-3 days.  You have a fever and your symptoms suddenly get worse. MAKE SURE YOU:   Understand these instructions.  Will watch your condition.  Will get help right  away if you are not doing well or get worse. Document Released: 02/03/2007 Document Revised: 07/21/2013 Document Reviewed: 06/07/2013 [facility] Patient Information 2015 [place], [place]. This information is not intended to replace advice given to you by your health care provider. Make sure you discuss any questions you have with your health care provider.   Obtain knee high graduated compression hose, 20-30 mm mercury graduated pressure, measure to fit first thing in the morning as discussed.

## 2014-08-17 ENCOUNTER — Other Ambulatory Visit: Payer: Commercial Managed Care - HMO

## 2014-09-22 ENCOUNTER — Encounter (HOSPITAL_COMMUNITY): Payer: Self-pay | Admitting: Vascular Surgery

## 2014-10-10 ENCOUNTER — Other Ambulatory Visit: Payer: Commercial Managed Care - HMO

## 2014-10-12 ENCOUNTER — Telehealth: Payer: Self-pay | Admitting: Cardiology

## 2014-10-12 ENCOUNTER — Encounter: Payer: Self-pay | Admitting: Cardiology

## 2014-10-12 DIAGNOSIS — R079 Chest pain, unspecified: Secondary | ICD-10-CM

## 2014-10-12 NOTE — Telephone Encounter (Signed)
Pt st he has gas pains right around his hear that go away and keep coming back.  He st he is not having any symptoms right now. He st this has been a chronic problem, but the episodes are becoming more and more frequent in the last 2 weeks and last longer amounts of time. He st he has taken "gas pills" every once in a while and they do seem to help, but not always.  He is concerned for his heart health because he felt this way when he had a prior blockage.  To Dr. Radford Pax for review.

## 2014-10-12 NOTE — Telephone Encounter (Signed)
Pt c/o of Chest Pain: 1. Are you having CP right now? No he describes it as Gas discomfort. (A squeezing motion)  2. Are you experiencing any other symptoms (ex. SOB, nausea, vomiting, sweating)? SOB on 12/29 for the most part of the day 3. How long have you been experiencing CP? Within the last 2 weeks 4. Is your CP continuous or coming and going? Coming and going 5. Have you taken Nitroglycerin? No  Comment: Pt daughter called states that he keeps having "gas" around his heart for the past 2 weeks. Daughter believes that it is chest discomfort and that she wants to be sure that its not another blockage. Please call back to discuss//sr

## 2014-10-12 NOTE — Telephone Encounter (Signed)
Please have patient stop omeprazole and start Protonix 40mg  BID.  Please set him up for 2 day Lexiscan myoview for CP

## 2014-10-13 MED ORDER — PANTOPRAZOLE SODIUM 40 MG PO TBEC: 40.0000 mg | DELAYED_RELEASE_TABLET | Freq: Two times a day (BID) | ORAL | Status: DC

## 2014-10-13 NOTE — Telephone Encounter (Signed)
Instructed [name] to STOP Omeprazole and to START Protonix 40 mg BID. Informed him he will be called to schedule a 2 day Lexiscan Myoview. Patient agrees with treatment plan.

## 2014-10-19 ENCOUNTER — Encounter (HOSPITAL_COMMUNITY): Payer: Commercial Managed Care - HMO

## 2014-10-19 ENCOUNTER — Ambulatory Visit (HOSPITAL_COMMUNITY): Payer: Commercial Managed Care - HMO | Attending: Cardiovascular Disease | Admitting: Radiology

## 2014-10-19 VITALS — BP 145/77 | HR 69 | Ht 64.0 in | Wt 209.0 lb

## 2014-10-19 DIAGNOSIS — I2510 Atherosclerotic heart disease of native coronary artery without angina pectoris: Secondary | ICD-10-CM

## 2014-10-19 DIAGNOSIS — R079 Chest pain, unspecified: Secondary | ICD-10-CM | POA: Insufficient documentation

## 2014-10-19 MED ORDER — TECHNETIUM TC 99M SESTAMIBI GENERIC - CARDIOLITE
33.0000 | Freq: Once | INTRAVENOUS | Status: AC | PRN
  Administered 2014-10-19: 33 via INTRAVENOUS

## 2014-10-21 ENCOUNTER — Encounter: Payer: Self-pay | Admitting: Cardiology

## 2014-10-21 ENCOUNTER — Ambulatory Visit (HOSPITAL_COMMUNITY): Payer: Commercial Managed Care - HMO | Attending: Cardiovascular Disease

## 2014-10-21 DIAGNOSIS — R0989 Other specified symptoms and signs involving the circulatory and respiratory systems: Secondary | ICD-10-CM

## 2014-10-21 DIAGNOSIS — J449 Chronic obstructive pulmonary disease, unspecified: Secondary | ICD-10-CM | POA: Diagnosis not present

## 2014-10-21 DIAGNOSIS — R079 Chest pain, unspecified: Secondary | ICD-10-CM

## 2014-10-21 MED ORDER — TECHNETIUM TC 99M SESTAMIBI GENERIC - CARDIOLITE
33.0000 | Freq: Once | INTRAVENOUS | Status: AC | PRN
  Administered 2014-10-21: 33 via INTRAVENOUS

## 2014-10-21 MED ORDER — REGADENOSON 0.4 MG/5ML IV SOLN
0.4000 mg | Freq: Once | INTRAVENOUS | Status: AC
  Administered 2014-10-21: 0.4 mg via INTRAVENOUS

## 2014-10-21 NOTE — Progress Notes (Signed)
[name] [address] [place], [place] [address] [phone]    Cardiology Nuclear Med Study  [name] is a 81 y.o. male     MRN : [id number]     DOB: June 01, 1930  Procedure Date: 10/21/2014  Nuclear Med Background Indication for Stress Test:  Evaluation for Ischemia and Stent Patency History:  Asthma,CAD, Stent, and 06-18-2013 Myocardial Perfusion Imaging-Normal, EF=50% Cardiac Risk Factors: Hypertension, NIDDM and PVD  Symptoms: Chest Pain with/without exertion (last occurrence yesterday), DOE and SOB   Nuclear Pre-Procedure Caffeine/Decaff Intake:  None> 12 hrs NPO After: 10:30pm   Lungs:  clear O2 Sat: 94% on room air. IV 0.9% NS with Angio Cath:  22g  IV Site: R Wrist, tolerated well IV Started by:  Irven Baltimore, RN  Chest Size (in):  48 Cup Size: n/a  Height: 5\' 4"  (1.626 m)  Weight:  209 lb (94.802 kg)  BMI:  Body mass index is 35.86 kg/(m^2). Tech Comments:  Patient took Lopressor, and held Amaryl, and metformin this am. Albuterol Inhaler 2 sprays prior to [name]    Nuclear Med Study 1 or 2 day study: 1 day  Stress Test Type:  Lexiscan  Reading MD: N/A  Order Authorizing Provider:  Fransico Him, MD  Resting Radionuclide: Technetium 40m Sestamibi  Resting Radionuclide Dose: 33.0 mCi on 10/19/14   Stress Radionuclide:  Technetium 49m Sestamibi  Stress Radionuclide Dose: 33.0 mCi on 10/21/14           Stress Protocol Rest HR: 69 Stress HR: 93  Rest BP: 145/77 Stress BP: 152/90  Exercise Time (min): n/a METS: n/a   Predicted Max HR: 138 bpm % Max HR: 67.39 bpm Rate Pressure Product: 14136   Dose of Adenosine (mg):  n/a Dose of Lexiscan: 0.4 mg  Dose of Atropine (mg): n/a Dose of Dobutamine: n/a mcg/kg/min (at max HR)  Stress Test Technologist: Irven Baltimore, RN  Nuclear Technologist:  Earl Many, CNMT     Rest Procedure:  Myocardial perfusion imaging was performed at rest 45 minutes following the  intravenous administration of Technetium 5m Sestamibi. Rest ECG: NSR - Normal EKG  Stress Procedure:  The patient received IV Lexiscan 0.4 mg over 15-seconds.  Technetium 23m Sestamibi injected at 30-seconds. The patient complained of SOB, stomach tightness, but denied chest pain with Lexiscan. Quantitative spect images were obtained after a 45 minute delay. Stress ECG: No significant change from baseline ECG  QPS Raw Data Images:  Normal; no motion artifact; normal heart/lung ratio. Stress Images:  Inferoapical perfusion defect Rest Images:  Inferoapical perfusion defect Subtraction (SDS):  No evidence of ischemia. Transient Ischemic Dilatation (Normal <1.22):  0.98 Lung/Heart Ratio (Normal <0.45):  0.25  Quantitative Gated Spect Images QGS EDV:  134 ml QGS ESV:  72 ml  Impression Exercise Capacity:  Lexiscan with no exercise. BP Response:  Normal blood pressure response. Clinical Symptoms:  Stomach tightness ECG Impression:  No significant ECG changes with Lexiscan. Comparison with Prior Nuclear Study: No images to compare  Overall Impression:  Intermediate risk stress nuclear study with a moderate sized and intensity, fixed, inferoapical perfusion defect which is likely scar. No reversible ischemia.  LV Ejection Fraction: 46%.  LV Wall Motion:  inferoapical hypokinesis   Pixie Casino, MD, [facility] Board Certified in Nuclear Cardiology Attending Cardiologist [facility]

## 2014-10-23 DIAGNOSIS — I5032 Chronic diastolic (congestive) heart failure: Secondary | ICD-10-CM | POA: Diagnosis not present

## 2014-11-15 NOTE — Progress Notes (Signed)
Cardiology Office Note   Date:  11/16/2014   ID:  [name], DOB 11-Apr-1930, MRN [id number]  PCP:  [name], MD  Cardiologist:   Sueanne Margarita, MD   Chief Complaint  Patient presents with  . Coronary Artery Disease  . Hypertension  . Hyperlipidemia  . Sleep Apnea  . Congestive Heart Failure      History of Present Illness: [name] is a 81 y.o. male with a history of ASCAD, HTN, dyslipidemia, OSA on CPAP, chronic diastolic CHF and obesity who presents today for followup. He is doing well. He denies any dizziness, palpitations or syncope. He has chronic SOB which he thinks has gotten worse. He says that he has been having more CP that he thinks is from indigestion.  The pain comes and goes and is sharp.  It lasts up to an hour at a time and then resolves.  It still resolves with belching.   He has some chronic LLE edema which is the leg he had the DVT in the past. He thinks the swelling is worse than in the past. He has been having some pain in his LLE as well. He tolerates the CPAP and mask well. He feels the pressure is adequate. He feels rested in the am and has no daytime sleepiness.      Past Medical History  Diagnosis Date  . Diabetes mellitus   . Asthma   . Hypertension   . Hypercholesterolemia   . Hypothyroidism   . Anxiety   . COPD (chronic obstructive pulmonary disease)   . GERD (gastroesophageal reflux disease)   . Sleep apnea     severe OSA awaiting CPAP titration  . Peripheral vascular disease 11/2006    s/p left stent  . Shortness of breath   . DVT (deep venous thrombosis)   . Diverticulitis Oct. 2013    bleeding in the past and Effient for his CAD was stopped  . Coronary artery disease     s/p PCI of RCA 03/2009 at which time there was 70% ramus and 90% RCA, cath 01/2011 showed patent stents in RCA with moderate prox disesae, aneurysmal left circ and small 90% ramus s/p PCI, patent LAD EF 55%  . Chronic diastolic CHF (congestive  heart failure)     diastolic   . OSA (obstructive sleep apnea)     severe on CPAP  . Obesity (BMI 30-39.9)     Past Surgical History  Procedure Laterality Date  . Coronary stent placement  2010 and 2012  . Hernia repair  1992    evntral hernia repair  . Esophagogastroduodenoscopy  12/26/2011    Procedure: ESOPHAGOGASTRODUODENOSCOPY (EGD);  Surgeon: Garlan Fair, MD;  Location: [facility];  Service: Endoscopy;  Laterality: N/A;  . Balloon dilation  12/26/2011    Procedure: BALLOON DILATION;  Surgeon: Garlan Fair, MD;  Location: [facility]OS[facility];  Service: Endoscopy;  Laterality: N/A;  . Esophagogastroduodenoscopy  08/13/2012    Procedure: ESOPHAGOGASTRODUODENOSCOPY (EGD);  Surgeon: Arta Silence, MD;  Location: [facility];  Service: Endoscopy;  Laterality: Left;  . Colonoscopy  08/14/2012    Procedure: COLONOSCOPY;  Surgeon: Arta Silence, MD;  Location: [facility] ENDOSCOPY;  Service: Endoscopy;  Laterality: N/A;  . Abdominal aortagram N/A 04/20/2012    Procedure: ABDOMINAL AORTAGRAM;  Surgeon: Angelia Mould, MD;  Location: [facility] CATH LAB;  Service: Cardiovascular;  Laterality: N/A;  . Abdominal aortagram N/A 12/29/2012    Procedure: ABDOMINAL AORTAGRAM;  Surgeon: Serafina Mitchell, MD;  Location: [facility] CATH LAB;  Service: Cardiovascular;  Laterality: N/A;  . Lower extremity angiogram Bilateral 12/29/2012    Procedure: LOWER EXTREMITY ANGIOGRAM;  Surgeon: Serafina Mitchell, MD;  Location: [facility] CATH LAB;  Service: Cardiovascular;  Laterality: Bilateral;  bilat lower extrem angio     Current Outpatient Prescriptions  Medication Sig Dispense Refill  . albuterol (PROVENTIL HFA;VENTOLIN HFA) 108 (90 BASE) MCG/ACT inhaler Inhale 2 puffs into the lungs every 6 (six) hours as needed for wheezing or shortness of breath (wheezing).     Marland Kitchen albuterol (PROVENTIL) (2.5 MG/3ML) 0.083% nebulizer solution Take 3 mLs (2.5 mg total) by nebulization every 4 (four) hours as needed for wheezing or shortness of  breath. 75 mL 12  . aspirin EC 81 MG tablet Take 81 mg by mouth daily.    . budesonide-formoterol (SYMBICORT) 80-4.5 MCG/ACT inhaler Inhale 2 puffs into the lungs 2 (two) times daily as needed (wheezing). FOR WHEEZING    . ezetimibe-simvastatin (VYTORIN) 10-20 MG per tablet Take 0.5 tablets by mouth daily. 30 tablet 6  . glimepiride (AMARYL) 2 MG tablet Take 2 mg by mouth daily.      Marland Kitchen guaiFENesin-dextromethorphan (ROBITUSSIN DM) 100-10 MG/5ML syrup Take 5 mLs by mouth every 4 (four) hours as needed for cough. 118 mL 0  . isosorbide mononitrate (IMDUR) 60 MG 24 hr tablet Take 60 mg by mouth daily.    Marland Kitchen levothyroxine (SYNTHROID, LEVOTHROID) 200 MCG tablet Take 200 mcg by mouth daily before breakfast.    . levothyroxine (SYNTHROID, LEVOTHROID) 75 MCG tablet Take 75 mcg by mouth daily.    Marland Kitchen loperamide (IMODIUM A-D) 2 MG tablet Take 2 mg by mouth 4 (four) times daily as needed (loose stools). For diarrhea.    . metoprolol succinate (TOPROL-XL) 50 MG 24 hr tablet Take 50 mg by mouth daily.  11  . nitroGLYCERIN (NITROSTAT) 0.4 MG SL tablet Place 0.4 mg under the tongue every 5 (five) minutes x 3 doses as needed. For chest pain.    . NON FORMULARY Sleeps with CPAP Machine    . pantoprazole (PROTONIX) 40 MG tablet Take 1 tablet (40 mg total) by mouth 2 (two) times daily. 60 tablet 3   No current facility-administered medications for this visit.    Allergies:   Clopidogrel bisulfate    Social History:  The patient  reports that he quit smoking about 51 years ago. His smoking use included Cigarettes and Cigars. He has a 7.5 pack-year smoking history. He has never used smokeless tobacco. He reports that he does not drink alcohol or use illicit drugs.   Family History:  The patient's family history includes Asthma in his sister; Breast cancer in his sister; Diabetes in his brother, mother, and sister; Heart attack in his mother and sister; Heart disease in his sister; Hyperlipidemia in his mother, sister,  and sister; Hypertension in his mother, sister, and sister. There is no history of Malignant hyperthermia.    ROS:  Please see the history of present illness.   Otherwise, review of systems are positive for none.   All other systems are reviewed and negative.    PHYSICAL EXAM: VS:  BP 150/86 mmHg  Pulse 82  Ht 5\' 3"  (1.6 m)  Wt 217 lb 6.4 oz (98.612 kg)  BMI 38.52 kg/m2  SpO2 94% , BMI Body mass index is 38.52 kg/(m^2). GEN: Well nourished, well developed, in no acute distress HEENT: normal Neck: no JVD, carotid bruits, or masses Cardiac: RRR; no murmurs, rubs, or  gallops,trace LLE edema  Respiratory:  clear to auscultation bilaterally, normal work of breathing GI: soft, nontender, nondistended, + BS MS: no deformity or atrophy Skin: warm and dry, no rash Neuro:  Strength and sensation are intact Psych: euthymic mood, full affect   EKG:  EKG is not ordered today.    Recent Labs: 07/12/2014: Pro B Natriuretic peptide (BNP) 31.2 07/13/2014: TSH 5.150* 07/14/2014: BUN 23; Creatinine 1.06; Hemoglobin 11.9*; Platelets 150; Potassium 4.2; Sodium 143    Lipid Panel    Component Value Date/Time   CHOL 153 07/05/2014 1101   TRIG 102.0 07/05/2014 1101   HDL 42.20 07/05/2014 1101   CHOLHDL 4 07/05/2014 1101   VLDL 20.4 07/05/2014 1101   LDLCALC 90 07/05/2014 1101      Wt Readings from Last 3 Encounters:  11/16/14 217 lb 6.4 oz (98.612 kg)  10/21/14 209 lb (94.802 kg)  08/03/14 212 lb (96.163 kg)       ASSESSMENT AND PLAN:  1. ASCAD - his CP is atypical and not like his typical angina.  It resolves with belching but seems to be getting worse.  His recent nuclear stress test showed no ischemia.  I will refer him to GI for further evaluation.  Continue ASA/nitrates/BB/statin - continue ASA/Imdur/BB 2. HTN - borderline controlled - continue metoprolol .  We discussed following a low sodium diet which should help with BP control.   3. Dyslipidemia - he was having achiness in  the muscles in his upper body which has resolved on a lower dose of Vytorin - check FLP and ALT 4. OSA on CPAP - I will get a download from his DME  5. Obesity 6. Chronic diastolic CHF - he is having more SOB and increased LLE edema. I suspect this is multifactorial from obesity, chronic diastolic CHF, and noncompliance with sodium restriction.   He has chronic LLE edema at baseline from prior DVT.   I will check a BNP to assess for volume overload.  He uses a lot of table salt and keeps his legs hanging down during the day.  I counseled him extensively in the importance to follow a low sodium diet.  I will check a D-Dimer to rule out PE given his LE edema and worsening SOB and h/o DVT and get LE venous doppler on the LLE - continue beta blocker  7. COPD - followup with PCP   8. Chronic DVT   Current medicines are reviewed at length with the patient today.  The patient does not have concerns regarding medicines.  The following changes have been made:  no change  Labs/ tests ordered today include: FLP and ALT     Disposition:   FU with me in 6 months   Signed, Sueanne Margarita, MD  11/16/2014 9:52 AM    [facility] [address] [place], [place], [place]  [address] Phone: [phone]; Fax: [phone]

## 2014-11-16 ENCOUNTER — Ambulatory Visit (HOSPITAL_BASED_OUTPATIENT_CLINIC_OR_DEPARTMENT_OTHER): Payer: Commercial Managed Care - HMO | Admitting: *Deleted

## 2014-11-16 ENCOUNTER — Ambulatory Visit
Admission: RE | Admit: 2014-11-16 | Discharge: 2014-11-16 | Disposition: A | Discharge status: Home / Self Care | Payer: Commercial Managed Care - HMO | Source: Ambulatory Visit | Attending: Cardiology | Admitting: Cardiology

## 2014-11-16 ENCOUNTER — Telehealth: Payer: Self-pay | Admitting: Cardiology

## 2014-11-16 ENCOUNTER — Encounter (HOSPITAL_COMMUNITY): Payer: Self-pay | Admitting: *Deleted

## 2014-11-16 ENCOUNTER — Encounter: Payer: Self-pay | Admitting: Cardiology

## 2014-11-16 ENCOUNTER — Other Ambulatory Visit (HOSPITAL_COMMUNITY): Payer: Self-pay | Admitting: *Deleted

## 2014-11-16 ENCOUNTER — Emergency Department (HOSPITAL_COMMUNITY): Payer: Commercial Managed Care - HMO

## 2014-11-16 ENCOUNTER — Telehealth: Payer: Self-pay

## 2014-11-16 ENCOUNTER — Observation Stay (HOSPITAL_COMMUNITY)
Admission: EM | Admit: 2014-11-16 | Discharge: 2014-11-17 | Disposition: A | Discharge status: Home / Self Care | Payer: Commercial Managed Care - HMO | Attending: Internal Medicine | Admitting: Internal Medicine

## 2014-11-16 ENCOUNTER — Ambulatory Visit (INDEPENDENT_AMBULATORY_CARE_PROVIDER_SITE_OTHER): Payer: Commercial Managed Care - HMO | Admitting: Cardiology

## 2014-11-16 VITALS — BP 150/86 | HR 82 | Ht 63.0 in | Wt 217.4 lb

## 2014-11-16 DIAGNOSIS — E669 Obesity, unspecified: Secondary | ICD-10-CM | POA: Insufficient documentation

## 2014-11-16 DIAGNOSIS — I825Z2 Chronic embolism and thrombosis of unspecified deep veins of left distal lower extremity: Secondary | ICD-10-CM

## 2014-11-16 DIAGNOSIS — I10 Essential (primary) hypertension: Secondary | ICD-10-CM | POA: Diagnosis present

## 2014-11-16 DIAGNOSIS — Z9981 Dependence on supplemental oxygen: Secondary | ICD-10-CM | POA: Insufficient documentation

## 2014-11-16 DIAGNOSIS — K5792 Diverticulitis of intestine, part unspecified, without perforation or abscess without bleeding: Secondary | ICD-10-CM | POA: Insufficient documentation

## 2014-11-16 DIAGNOSIS — I739 Peripheral vascular disease, unspecified: Secondary | ICD-10-CM | POA: Insufficient documentation

## 2014-11-16 DIAGNOSIS — I5032 Chronic diastolic (congestive) heart failure: Secondary | ICD-10-CM | POA: Diagnosis present

## 2014-11-16 DIAGNOSIS — R609 Edema, unspecified: Secondary | ICD-10-CM

## 2014-11-16 DIAGNOSIS — R600 Localized edema: Secondary | ICD-10-CM

## 2014-11-16 DIAGNOSIS — R899 Unspecified abnormal finding in specimens from other organs, systems and tissues: Secondary | ICD-10-CM

## 2014-11-16 DIAGNOSIS — R0602 Shortness of breath: Secondary | ICD-10-CM | POA: Diagnosis not present

## 2014-11-16 DIAGNOSIS — R0789 Other chest pain: Secondary | ICD-10-CM

## 2014-11-16 DIAGNOSIS — E785 Hyperlipidemia, unspecified: Secondary | ICD-10-CM

## 2014-11-16 DIAGNOSIS — F419 Anxiety disorder, unspecified: Secondary | ICD-10-CM | POA: Insufficient documentation

## 2014-11-16 DIAGNOSIS — R0600 Dyspnea, unspecified: Secondary | ICD-10-CM | POA: Insufficient documentation

## 2014-11-16 DIAGNOSIS — J441 Chronic obstructive pulmonary disease with (acute) exacerbation: Principal | ICD-10-CM | POA: Insufficient documentation

## 2014-11-16 DIAGNOSIS — E780 Pure hypercholesterolemia: Secondary | ICD-10-CM | POA: Diagnosis not present

## 2014-11-16 DIAGNOSIS — K219 Gastro-esophageal reflux disease without esophagitis: Secondary | ICD-10-CM | POA: Diagnosis not present

## 2014-11-16 DIAGNOSIS — G4733 Obstructive sleep apnea (adult) (pediatric): Secondary | ICD-10-CM

## 2014-11-16 DIAGNOSIS — R635 Abnormal weight gain: Secondary | ICD-10-CM | POA: Diagnosis not present

## 2014-11-16 DIAGNOSIS — R0902 Hypoxemia: Secondary | ICD-10-CM | POA: Diagnosis present

## 2014-11-16 DIAGNOSIS — I2510 Atherosclerotic heart disease of native coronary artery without angina pectoris: Secondary | ICD-10-CM | POA: Diagnosis present

## 2014-11-16 DIAGNOSIS — I82402 Acute embolism and thrombosis of unspecified deep veins of left lower extremity: Secondary | ICD-10-CM

## 2014-11-16 DIAGNOSIS — Z7982 Long term (current) use of aspirin: Secondary | ICD-10-CM | POA: Diagnosis not present

## 2014-11-16 DIAGNOSIS — E119 Type 2 diabetes mellitus without complications: Secondary | ICD-10-CM | POA: Diagnosis not present

## 2014-11-16 DIAGNOSIS — Z9861 Coronary angioplasty status: Secondary | ICD-10-CM | POA: Diagnosis not present

## 2014-11-16 DIAGNOSIS — Z87891 Personal history of nicotine dependence: Secondary | ICD-10-CM | POA: Diagnosis not present

## 2014-11-16 DIAGNOSIS — I2583 Coronary atherosclerosis due to lipid rich plaque: Principal | ICD-10-CM

## 2014-11-16 DIAGNOSIS — J449 Chronic obstructive pulmonary disease, unspecified: Secondary | ICD-10-CM | POA: Diagnosis not present

## 2014-11-16 DIAGNOSIS — Z86718 Personal history of other venous thrombosis and embolism: Secondary | ICD-10-CM | POA: Insufficient documentation

## 2014-11-16 DIAGNOSIS — Z79899 Other long term (current) drug therapy: Secondary | ICD-10-CM | POA: Diagnosis not present

## 2014-11-16 DIAGNOSIS — Z8672 Personal history of thrombophlebitis: Secondary | ICD-10-CM

## 2014-11-16 DIAGNOSIS — E039 Hypothyroidism, unspecified: Secondary | ICD-10-CM | POA: Insufficient documentation

## 2014-11-16 DIAGNOSIS — R7989 Other specified abnormal findings of blood chemistry: Secondary | ICD-10-CM

## 2014-11-16 DIAGNOSIS — M7989 Other specified soft tissue disorders: Secondary | ICD-10-CM

## 2014-11-16 LAB — BASIC METABOLIC PANEL
Anion gap: 5 (ref 5–15)
BUN: 11 mg/dL (ref 6–23)
BUN: 11 mg/dL (ref 6–23)
CALCIUM: 9.6 mg/dL (ref 8.4–10.5)
CHLORIDE: 102 meq/L (ref 96–112)
CO2: 31 meq/L (ref 19–32)
CO2: 32 mmol/L (ref 19–32)
Calcium: 9.6 mg/dL (ref 8.4–10.5)
Chloride: 102 mmol/L (ref 96–112)
Creatinine, Ser: 1.02 mg/dL (ref 0.40–1.50)
Creatinine, Ser: 1.1 mg/dL (ref 0.50–1.35)
GFR calc non Af Amer: 61 mL/min — ABNORMAL LOW (ref >90)
GFR, EST AFRICAN AMERICAN: 70 mL/min — AB (ref >90)
GFR: 89.77 mL/min (ref >60.00)
GLUCOSE: 102 mg/dL — AB (ref 70–99)
Glucose, Bld: 92 mg/dL (ref 70–99)
POTASSIUM: 3.8 mmol/L (ref 3.5–5.1)
Potassium: 4 mEq/L (ref 3.5–5.1)
Sodium: 139 mEq/L (ref 135–145)
Sodium: 139 mmol/L (ref 135–145)

## 2014-11-16 LAB — I-STAT ARTERIAL BLOOD GAS, ED
Acid-Base Excess: 2 mmol/L (ref 0.0–2.0)
Bicarbonate: 29 mEq/L — ABNORMAL HIGH (ref 20.0–24.0)
O2 Saturation: 92 %
PCO2 ART: 52.4 mmHg — AB (ref 35.0–45.0)
PH ART: 7.352 (ref 7.350–7.450)
PO2 ART: 67 mmHg — AB (ref 80.0–100.0)
TCO2: 31 mmol/L (ref 0–100)

## 2014-11-16 LAB — HEPATIC FUNCTION PANEL
ALT: 16 U/L (ref 0–53)
AST: 22 U/L (ref 0–37)
Albumin: 4.4 g/dL (ref 3.5–5.2)
Alkaline Phosphatase: 48 U/L (ref 39–117)
Bilirubin, Direct: 0.1 mg/dL (ref 0.0–0.3)
Total Bilirubin: 0.6 mg/dL (ref 0.2–1.2)
Total Protein: 7.1 g/dL (ref 6.0–8.3)

## 2014-11-16 LAB — LIPID PANEL
Cholesterol: 152 mg/dL (ref 0–200)
HDL: 42.6 mg/dL (ref >39.00)
LDL Cholesterol: 90 mg/dL (ref 0–99)
NonHDL: 109.4
Total CHOL/HDL Ratio: 4
Triglycerides: 99 mg/dL (ref 0.0–149.0)
VLDL: 19.8 mg/dL (ref 0.0–40.0)

## 2014-11-16 LAB — CBC
HCT: 40.1 % (ref 39.0–52.0)
Hemoglobin: 13.5 g/dL (ref 13.0–17.0)
MCH: 29.3 pg (ref 26.0–34.0)
MCHC: 33.7 g/dL (ref 30.0–36.0)
MCV: 87 fL (ref 78.0–100.0)
Platelets: 158 10*3/uL (ref 150–400)
RBC: 4.61 MIL/uL (ref 4.22–5.81)
RDW: 13.8 % (ref 11.5–15.5)
WBC: 7.9 10*3/uL (ref 4.0–10.5)

## 2014-11-16 LAB — D-DIMER, QUANTITATIVE (NOT AT ARMC): D DIMER QUANT: 2.53 ug{FEU}/mL — AB (ref 0.00–0.48)

## 2014-11-16 LAB — BRAIN NATRIURETIC PEPTIDE
B Natriuretic Peptide: 45 pg/mL (ref 0.0–100.0)
Pro B Natriuretic peptide (BNP): 33 pg/mL (ref 0.0–100.0)

## 2014-11-16 LAB — TROPONIN I: Troponin I: <0.03 ng/mL (ref <0.031)

## 2014-11-16 LAB — GLUCOSE, CAPILLARY: Glucose-Capillary: 156 mg/dL — ABNORMAL HIGH (ref 70–99)

## 2014-11-16 MED ORDER — SODIUM CHLORIDE 0.9 % IJ SOLN
3.0000 mL | Freq: Two times a day (BID) | INTRAMUSCULAR | Status: DC
  Administered 2014-11-17: 3 mL via INTRAVENOUS

## 2014-11-16 MED ORDER — BUDESONIDE-FORMOTEROL FUMARATE 80-4.5 MCG/ACT IN AERO
2.0000 | INHALATION_SPRAY | Freq: Two times a day (BID) | RESPIRATORY_TRACT | Status: DC
  Administered 2014-11-17: 2 via RESPIRATORY_TRACT
  Filled 2014-11-16: qty 6.9

## 2014-11-16 MED ORDER — PANTOPRAZOLE SODIUM 40 MG PO TBEC
40.0000 mg | DELAYED_RELEASE_TABLET | Freq: Two times a day (BID) | ORAL | Status: DC
  Administered 2014-11-17 (×2): 40 mg via ORAL
  Filled 2014-11-16 (×2): qty 1

## 2014-11-16 MED ORDER — LEVOTHYROXINE SODIUM 200 MCG PO TABS: 200.0000 ug | ORAL_TABLET | Freq: Every day | ORAL | Status: DC

## 2014-11-16 MED ORDER — IOHEXOL 350 MG/ML SOLN: 80.0000 mL | Freq: Once | INTRAVENOUS | Status: AC | PRN

## 2014-11-16 MED ORDER — ALPRAZOLAM 0.25 MG PO TABS
0.2500 mg | ORAL_TABLET | Freq: Once | ORAL | Status: AC
  Administered 2014-11-17: 0.25 mg via ORAL
  Filled 2014-11-16: qty 1

## 2014-11-16 MED ORDER — INSULIN ASPART 100 UNIT/ML ~~LOC~~ SOLN: 0.0000 [IU] | Freq: Every day | SUBCUTANEOUS | Status: DC

## 2014-11-16 MED ORDER — METOPROLOL SUCCINATE ER 50 MG PO TB24
50.0000 mg | ORAL_TABLET | Freq: Every day | ORAL | Status: DC
  Administered 2014-11-17: 50 mg via ORAL
  Filled 2014-11-16: qty 1

## 2014-11-16 MED ORDER — ONDANSETRON HCL 4 MG/2ML IJ SOLN: 4.0000 mg | Freq: Four times a day (QID) | INTRAMUSCULAR | Status: DC | PRN

## 2014-11-16 MED ORDER — ENOXAPARIN SODIUM 100 MG/ML ~~LOC~~ SOLN
100.0000 mg | Freq: Once | SUBCUTANEOUS | Status: DC
  Filled 2014-11-16: qty 1

## 2014-11-16 MED ORDER — ALBUTEROL SULFATE (2.5 MG/3ML) 0.083% IN NEBU: 2.5000 mg | INHALATION_SOLUTION | RESPIRATORY_TRACT | Status: DC | PRN

## 2014-11-16 MED ORDER — ALBUTEROL SULFATE HFA 108 (90 BASE) MCG/ACT IN AERS: 2.0000 | INHALATION_SPRAY | Freq: Four times a day (QID) | RESPIRATORY_TRACT | Status: DC | PRN

## 2014-11-16 MED ORDER — NITROGLYCERIN 0.4 MG SL SUBL: 0.4000 mg | SUBLINGUAL_TABLET | SUBLINGUAL | Status: DC | PRN

## 2014-11-16 MED ORDER — INSULIN ASPART 100 UNIT/ML ~~LOC~~ SOLN
0.0000 [IU] | Freq: Three times a day (TID) | SUBCUTANEOUS | Status: DC
  Administered 2014-11-17: 2 [IU] via SUBCUTANEOUS

## 2014-11-16 MED ORDER — FUROSEMIDE 10 MG/ML IJ SOLN
40.0000 mg | Freq: Once | INTRAMUSCULAR | Status: AC
  Administered 2014-11-16: 40 mg via INTRAVENOUS
  Filled 2014-11-16: qty 4

## 2014-11-16 MED ORDER — ONDANSETRON HCL 4 MG PO TABS: 4.0000 mg | ORAL_TABLET | Freq: Four times a day (QID) | ORAL | Status: DC | PRN

## 2014-11-16 MED ORDER — HYDRALAZINE HCL 20 MG/ML IJ SOLN
10.0000 mg | INTRAMUSCULAR | Status: DC | PRN
  Filled 2014-11-16: qty 1

## 2014-11-16 MED ORDER — ENOXAPARIN SODIUM 100 MG/ML ~~LOC~~ SOLN
100.0000 mg | Freq: Two times a day (BID) | SUBCUTANEOUS | Status: DC
  Administered 2014-11-17: 100 mg via SUBCUTANEOUS

## 2014-11-16 MED ORDER — ISOSORBIDE MONONITRATE ER 60 MG PO TB24
60.0000 mg | ORAL_TABLET | Freq: Every day | ORAL | Status: DC
  Administered 2014-11-17: 60 mg via ORAL
  Filled 2014-11-16: qty 1

## 2014-11-16 MED ORDER — EZETIMIBE-SIMVASTATIN 10-20 MG PO TABS
0.5000 | ORAL_TABLET | Freq: Every day | ORAL | Status: DC
  Administered 2014-11-17: 0.5 via ORAL
  Filled 2014-11-16: qty 0.5

## 2014-11-16 MED ORDER — ASPIRIN EC 81 MG PO TBEC
81.0000 mg | DELAYED_RELEASE_TABLET | Freq: Every day | ORAL | Status: DC
  Administered 2014-11-17: 81 mg via ORAL
  Filled 2014-11-16: qty 1

## 2014-11-16 MED ORDER — HYDRALAZINE HCL 20 MG/ML IJ SOLN: 10.0000 mg | INTRAMUSCULAR | Status: DC | PRN

## 2014-11-16 MED ORDER — ACETAMINOPHEN 650 MG RE SUPP: 650.0000 mg | Freq: Four times a day (QID) | RECTAL | Status: DC | PRN

## 2014-11-16 MED ORDER — ACETAMINOPHEN 325 MG PO TABS: 650.0000 mg | ORAL_TABLET | Freq: Four times a day (QID) | ORAL | Status: DC | PRN

## 2014-11-16 MED ORDER — LEVOTHYROXINE SODIUM 75 MCG PO TABS: 75.0000 ug | ORAL_TABLET | Freq: Every day | ORAL | Status: DC

## 2014-11-16 NOTE — Patient Instructions (Signed)
Your physician recommends that you continue on your current medications as directed. Please refer to the Current Medication list given to you today.  Your physician recommends that you have lab work TODAY (BMET, LFTs, BNP, DDimer, Lipids).  Dr. Radford Pax recommends you have a venous doppler of the left lower extremity.   You have been referred to Dr. Earle Gell at [facility] for non-cardiac chest pain with belching.   Your physician wants you to follow-up in: 6 months with Dr. Radford Pax. You will receive a reminder letter in the mail two months in advance. If you don't receive a letter, please call our office to schedule the follow-up appointment.

## 2014-11-16 NOTE — Telephone Encounter (Signed)
[name] calling to report D-dime result.  Informed her it is already being addressed.

## 2014-11-16 NOTE — Telephone Encounter (Signed)
-----   Message from Sueanne Margarita, MD sent at 11/16/2014  1:43 PM EST ----- Please let patient know that his D-DImer is elevated - he needs chest CT angio with contrast today to rule out PE

## 2014-11-16 NOTE — ED Notes (Signed)
Admitting MD at the bedside.  

## 2014-11-16 NOTE — ED Notes (Signed)
Attempted IV x1. Margreta Journey, RN to attempt.

## 2014-11-16 NOTE — ED Notes (Signed)
MD at the bedside  

## 2014-11-16 NOTE — Progress Notes (Signed)
Unilateral venous duplex Exam - left - negative for DVT

## 2014-11-16 NOTE — Telephone Encounter (Signed)
Informed patient's daughter that he needs to have a chest CT to rule out PE given his SOB and elevated D-dimer.  Instructed her they will be called to schedule ASAP.

## 2014-11-16 NOTE — Telephone Encounter (Signed)
New Messsage  Kim from [facility] wanted to speak w/ Rn to report alrt Lab result. Please call back and discuss.

## 2014-11-16 NOTE — ED Notes (Signed)
Patient being taken up [name], NT.

## 2014-11-16 NOTE — H&P (Signed)
[facility] History and Physical  Patient: [name]  MRN: [id number]  DOB: 31-Aug-1930  DOS: the patient was seen and examined on 11/16/2014 PCP: [name], MD  Chief Complaint: Shortness of breath  HPI: [name] is a 81 y.o. male with Past medical history of diabetes mellitus, COPD, essential hypertension, hypothyroidism, GERD, severe sleep apnea on C Pap, peripheral vascular disease, history of DVT, coronary artery disease, chronic diastolic heart failure. The patient is presenting with complaints of 3 month history of progressively worsening shortness of breath. He mentions that he was at his baseline a few months ago but since last 3 months he has noted that he has been having progressively worsening shortness of breath. Initially the shortness of breath was when he was "rushing through things" but now he is even short of breath at rest. He denies any dizziness or lightheadedness. He denies any syncopal episode. He denies any chest pain chest heaviness/tightness. He has a cough and has not had any sputum production. Denies any fever or chills. Denies any travel outside of [place]. Denies any recent travel or immobilization. Denies any significant change in his medication. He has noted that he has also been having progressively worsening swelling of his legs. He has chronic swelling of the left leg and now the right leg is also swollen. He has gained 10 pounds over last 3 months. He has a history of sleep apnea and has never had lied down flat and has been using pillows and wedges. He mentions he is compliant with his C Pap.  The patient is coming from home. And at his baseline independent for most of his ADL.  Review of Systems: as mentioned in the history of present illness.  A Comprehensive review of the other systems is negative.  Past Medical History  Diagnosis Date  . Diabetes mellitus   . Asthma   . Hypertension   . Hypercholesterolemia   .  Hypothyroidism   . Anxiety   . COPD (chronic obstructive pulmonary disease)   . GERD (gastroesophageal reflux disease)   . Sleep apnea     severe OSA awaiting CPAP titration  . Peripheral vascular disease 11/2006    s/p left stent  . Shortness of breath   . DVT (deep venous thrombosis)   . Diverticulitis Oct. 2013    bleeding in the past and Effient for his CAD was stopped  . Coronary artery disease     s/p PCI of RCA 03/2009 at which time there was 70% ramus and 90% RCA, cath 01/2011 showed patent stents in RCA with moderate prox disesae, aneurysmal left circ and small 90% ramus s/p PCI, patent LAD EF 55%  . Chronic diastolic CHF (congestive heart failure)     diastolic   . OSA (obstructive sleep apnea)     severe on CPAP  . Obesity (BMI 30-39.9)    Past Surgical History  Procedure Laterality Date  . Coronary stent placement  2010 and 2012  . Hernia repair  1992    evntral hernia repair  . Esophagogastroduodenoscopy  12/26/2011    Procedure: ESOPHAGOGASTRODUODENOSCOPY (EGD);  Surgeon: Garlan Fair, MD;  Location: [facility];  Service: Endoscopy;  Laterality: N/A;  . Balloon dilation  12/26/2011    Procedure: BALLOON DILATION;  Surgeon: Garlan Fair, MD;  Location: [facility]OSCOPY;  Service: Endoscopy;  Laterality: N/A;  . Esophagogastroduodenoscopy  08/13/2012    Procedure: ESOPHAGOGASTRODUODENOSCOPY (EGD);  Surgeon: Arta Silence, MD;  Location: [facility];  Service:  Endoscopy;  Laterality: Left;  . Colonoscopy  08/14/2012    Procedure: COLONOSCOPY;  Surgeon: Arta Silence, MD;  Location: [facility];  Service: Endoscopy;  Laterality: N/A;  . Abdominal aortagram N/A 04/20/2012    Procedure: ABDOMINAL AORTAGRAM;  Surgeon: Angelia Mould, MD;  Location: [facility] CATH LAB;  Service: Cardiovascular;  Laterality: N/A;  . Abdominal aortagram N/A 12/29/2012    Procedure: ABDOMINAL Maxcine Ham;  Surgeon: Serafina Mitchell, MD;  Location: [facility] CATH LAB;  Service: Cardiovascular;   Laterality: N/A;  . Lower extremity angiogram Bilateral 12/29/2012    Procedure: LOWER EXTREMITY ANGIOGRAM;  Surgeon: Serafina Mitchell, MD;  Location: [facility] CATH LAB;  Service: Cardiovascular;  Laterality: Bilateral;  bilat lower extrem angio   Social History:  reports that he quit smoking about 51 years ago. His smoking use included Cigarettes and Cigars. He has a 7.5 pack-year smoking history. He has never used smokeless tobacco. He reports that he does not drink alcohol or use illicit drugs.  Allergies  Allergen Reactions  . Clopidogrel Bisulfate Itching    Family History  Problem Relation Age of Onset  . Malignant hyperthermia Neg Hx   . Diabetes Mother   . Hyperlipidemia Mother   . Hypertension Mother   . Heart attack Mother   . Breast cancer Sister   . Diabetes Sister   . Hyperlipidemia Sister   . Hypertension Sister   . Heart attack Sister   . Hyperlipidemia Sister   . Hypertension Sister   . Heart disease Sister     Heart Disease before age 38  . Diabetes Brother   . Asthma Sister     Prior to Admission medications   Medication Sig Start Date End Date Taking? Authorizing Provider  albuterol (PROVENTIL HFA;VENTOLIN HFA) 108 (90 BASE) MCG/ACT inhaler Inhale 2 puffs into the lungs every 6 (six) hours as needed for wheezing or shortness of breath (wheezing).    Yes Historical Provider, MD  albuterol (PROVENTIL) (2.5 MG/3ML) 0.083% nebulizer solution Take 3 mLs (2.5 mg total) by nebulization every 4 (four) hours as needed for wheezing or shortness of breath. 07/14/14  Yes Theodis Blaze, MD  aspirin EC 81 MG tablet Take 81 mg by mouth daily.   Yes Historical Provider, MD  budesonide-formoterol (SYMBICORT) 80-4.5 MCG/ACT inhaler Inhale 2 puffs into the lungs 2 (two) times daily as needed (wheezing). FOR WHEEZING   Yes Historical Provider, MD  ezetimibe-simvastatin (VYTORIN) 10-20 MG per tablet Take 0.5 tablets by mouth daily. 07/08/14  Yes Sueanne Margarita, MD  glimepiride (AMARYL) 2 MG  tablet Take 2 mg by mouth daily.     Yes Historical Provider, MD  isosorbide mononitrate (IMDUR) 60 MG 24 hr tablet Take 60 mg by mouth daily.   Yes Historical Provider, MD  levothyroxine (SYNTHROID, LEVOTHROID) 200 MCG tablet Take 200 mcg by mouth daily before breakfast. Take with 75mcg for a total of 275mg  daily   Yes Historical Provider, MD  levothyroxine (SYNTHROID, LEVOTHROID) 75 MCG tablet Take 75 mcg by mouth daily. Take with 239mcg for a total of 275mg  daily 10/04/14  Yes Historical Provider, MD  loperamide (IMODIUM A-D) 2 MG tablet Take 2 mg by mouth 4 (four) times daily as needed (loose stools). For diarrhea.   Yes Historical Provider, MD  metoprolol succinate (TOPROL-XL) 50 MG 24 hr tablet Take 50 mg by mouth daily. 11/07/14  Yes Historical Provider, MD  nitroGLYCERIN (NITROSTAT) 0.4 MG SL tablet Place 0.4 mg under the tongue every 5 (five) minutes  x 3 doses as needed. For chest pain.   Yes Historical Provider, MD  NON FORMULARY Sleeps with CPAP Machine   Yes Historical Provider, MD  pantoprazole (PROTONIX) 40 MG tablet Take 1 tablet (40 mg total) by mouth 2 (two) times daily. 10/13/14  Yes Sueanne Margarita, MD  guaiFENesin-dextromethorphan (ROBITUSSIN DM) 100-10 MG/5ML syrup Take 5 mLs by mouth every 4 (four) hours as needed for cough. Patient not taking: Reported on 11/16/2014 07/14/14   Theodis Blaze, MD    Physical Exam: Filed Vitals:   11/16/14 2115 11/16/14 2124 11/16/14 2230 11/16/14 2341  BP:  162/76 201/95 178/74  Pulse: 65 65 69 80  Temp:    98.4 F (36.9 C)  TempSrc:    Oral  Resp: 15 21 20 18   Height:    5\' 3"  (1.6 m)  Weight:    96.344 kg (212 lb 6.4 oz)  SpO2: 90% 93% 93% 98%    General: Alert, Awake and Oriented to Time, Place and Person. Appear in mild distress Eyes: PERRL ENT: Oral Mucosa clear moist. Neck: no JVD Cardiovascular: S1 and S2 Present, no Murmur, Peripheral Pulses Present Respiratory: Bilateral Air entry equal and Decreased, bilateral basal Crackles,  no wheezes Abdomen: Bowel Sound present, Soft and non tender Skin: no Rash Extremities: Bilateral Pedal edema, no calf tenderness Neurologic: Grossly no focal neuro deficit.  Labs on Admission:  CBC:  Recent Labs Lab 11/16/14 1648  WBC 7.9  HGB 13.5  HCT 40.1  MCV 87.0  PLT 158    CMP     Component Value Date/Time   NA 139 11/16/2014 1648   K 3.8 11/16/2014 1648   CL 102 11/16/2014 1648   CO2 32 11/16/2014 1648   GLUCOSE 92 11/16/2014 1648   BUN 11 11/16/2014 1648   CREATININE 1.10 11/16/2014 1648   CALCIUM 9.6 11/16/2014 1648   PROT 7.1 11/16/2014 1124   ALBUMIN 4.4 11/16/2014 1124   AST 22 11/16/2014 1124   ALT 16 11/16/2014 1124   ALKPHOS 48 11/16/2014 1124   BILITOT 0.6 11/16/2014 1124   GFRNONAA 61* 11/16/2014 1648   GFRAA 70* 11/16/2014 1648    No results for input(s): LIPASE, AMYLASE in the last 168 hours.   Recent Labs Lab 11/16/14 1648  TROPONINI <0.03   BNP (last 3 results)  Recent Labs  11/16/14 1648  BNP 45.0    ProBNP (last 3 results)  Recent Labs  07/12/14 1542 11/16/14 1124  PROBNP 31.2 33.0     Radiological Exams on Admission: Dg Chest 2 View  11/16/2014   CLINICAL DATA:  Shortness of breath  EXAM: CHEST  2 VIEW  COMPARISON:  07/13/2014  FINDINGS: There is chronic pulmonary hyperinflation consistent with COPD. Bandlike opacities in the lower lungs consistent with scar. There is no edema, consolidation, effusion, or pneumothorax.  No cardiomegaly; stable aortic and hilar contours. Coronary stents noted.  Re- demonstrated calcified granuloma in the left mid lung.  IMPRESSION: COPD and mild lung scarring.  No acute superimposed finding.   Electronically Signed   By: Jorje Guild M.D.   On: 11/16/2014 17:56   EKG: Independently reviewed. normal sinus rhythm, nonspecific ST and T waves changes.  Assessment/Plan Principal Problem:   Hypoxia Active Problems:   HYPERTENSION, BENIGN   WEIGHT GAIN, ABNORMAL   DEEP VENOUS  THROMBOPHLEBITIS, LEFT, LEG, HX OF   Coronary artery disease   OSA (obstructive sleep apnea)   COPD GOLD III   Chronic diastolic CHF (congestive heart  failure)   Edema extremities   1. Hypoxia The patient is presenting with complaints of shortness of breath which is progressively worsening and now is hypoxic in the high 80s on rest. Has been worked up as an outpatient with a stress test in January 16 which was showing a fixed defect as well as compliant with his C Pap. Today due to his progressive shortness of breath or d-dimer was obtained which was elevated and therefore the patient was sent here for CT chest angiogram. The patient initially refused to lie down flat for the CT scan but later on agreed to do the CT scan with C Pap. With his a history of DVT and elevated d-dimer with evidence of hypoxia patient is empirically being treated at present with Lovenox. Will follow results of the CT scan and decide about further as Lovenox dosing. At present patient also has evidence of worsening of possibilities diastolic dysfunction as well as right-sided heart failure. I will lase the patient on Lasix 80 mg and monitor his oxygenation as well as daily weight and ins and outs. I would also check his right lower extremity Doppler as well. Patient's last echocardiogram was in 2011 therefore I will repeat an echocardiogram in the morning. Monitor serial troponin. Monitor on telemetry.  2. Obstructive sleep apnea. Continue with Cipro.  3. Accelerated hypertension. Blood pressure elevated. Continue home medication and using hydralazine as well as Lasix.  4. Hypothyroidism. Continue Synthroid.  5. Diabetes mellitus. Placing the patient on sliding scale.  Advance goals of care discussion: Full cor   DVT Prophylaxis: subcutaneous Heparin Nutrition: Cardiac diet  Disposition: Admitted to inpatient in telemetry unit.  Author: Berle Mull, MD [facility] Pager:  [phone] 11/16/2014, 11:54 PM    If 7PM-7AM, please contact night-coverage [email or web address] Password [email or web address]  Addendum: Patient's CT chest is negative for any pulmonary embolism. Patient has a soft plaque which is unchanged in the descending thoracic aorta. CT scan on the lung windows shows groundglass opacity probably consistent with congestion. Continue with diuresis.  Abbye Lao 5:30 AM 11/17/2014

## 2014-11-16 NOTE — ED Notes (Signed)
The pt is c/o sob for 3-4 dayts.  Swollen both feet and legs.  He was senr here from dr turners office due to fluid around his heart

## 2014-11-16 NOTE — ED Notes (Signed)
Patient unable to complete CT. Patient returned from [place].

## 2014-11-16 NOTE — ED Notes (Signed)
CT notified pt has IV 

## 2014-11-16 NOTE — ED Provider Notes (Signed)
CSN: [id number]     Arrival date & time 11/16/14  1624 History   First MD Initiated Contact with Patient 11/16/14 1654     Chief Complaint  Patient presents with  . Shortness of Breath     Patient is a 81 y.o. male presenting with shortness of breath. The history is provided by the patient. No language interpreter was used.  Shortness of Breath  [name] presents for evaluation of progressive shortness of breath. He reports 3-4 days of increased shortness of breath stating that he can only walk about 50 feet before having profound dyspnea. He denies any fevers, cough, vomiting, diarrhea. He has chest pain that he describes as indigestion. He states this indigestion has been present for a while but it is worse lately. He reports no alleviating or exacerbating features for this indigestion feeling. He has increased swelling in his lower extremities. He has chronic left lower extremity and swelling. The left lower extreme has been more swollen for the last 3-4 days and now he is also having swelling in the right lower extremity. He reports increased urinary output. He takes no fluid pills. He was seen at Dr. Theodosia Blender office today and was told to be seen in the emergency department for further evaluation. Sxs are moderate, constant, worsening.  Past Medical History  Diagnosis Date  . Diabetes mellitus   . Asthma   . Hypertension   . Hypercholesterolemia   . Hypothyroidism   . Anxiety   . COPD (chronic obstructive pulmonary disease)   . GERD (gastroesophageal reflux disease)   . Sleep apnea     severe OSA awaiting CPAP titration  . Peripheral vascular disease 11/2006    s/p left stent  . Shortness of breath   . DVT (deep venous thrombosis)   . Diverticulitis Oct. 2013    bleeding in the past and Effient for his CAD was stopped  . Coronary artery disease     s/p PCI of RCA 03/2009 at which time there was 70% ramus and 90% RCA, cath 01/2011 showed patent stents in RCA with moderate prox  disesae, aneurysmal left circ and small 90% ramus s/p PCI, patent LAD EF 55%  . Chronic diastolic CHF (congestive heart failure)     diastolic   . OSA (obstructive sleep apnea)     severe on CPAP  . Obesity (BMI 30-39.9)    Past Surgical History  Procedure Laterality Date  . Coronary stent placement  2010 and 2012  . Hernia repair  1992    evntral hernia repair  . Esophagogastroduodenoscopy  12/26/2011    Procedure: ESOPHAGOGASTRODUODENOSCOPY (EGD);  Surgeon: Garlan Fair, MD;  Location: [facility];  Service: Endoscopy;  Laterality: N/A;  . Balloon dilation  12/26/2011    Procedure: BALLOON DILATION;  Surgeon: Garlan Fair, MD;  Location: [facility];  Service: Endoscopy;  Laterality: N/A;  . Esophagogastroduodenoscopy  08/13/2012    Procedure: ESOPHAGOGASTRODUODENOSCOPY (EGD);  Surgeon: Arta Silence, MD;  Location: [facility];  Service: Endoscopy;  Laterality: Left;  . Colonoscopy  08/14/2012    Procedure: COLONOSCOPY;  Surgeon: Arta Silence, MD;  Location: [facility];  Service: Endoscopy;  Laterality: N/A;  . Abdominal aortagram N/A 04/20/2012    Procedure: ABDOMINAL AORTAGRAM;  Surgeon: Angelia Mould, MD;  Location: [facility] CATH LAB;  Service: Cardiovascular;  Laterality: N/A;  . Abdominal aortagram N/A 12/29/2012    Procedure: ABDOMINAL Maxcine Ham;  Surgeon: Serafina Mitchell, MD;  Location: [facility] CATH LAB;  Service: Cardiovascular;  Laterality: N/A;  . Lower extremity angiogram Bilateral 12/29/2012    Procedure: LOWER EXTREMITY ANGIOGRAM;  Surgeon: Serafina Mitchell, MD;  Location: [facility] CATH LAB;  Service: Cardiovascular;  Laterality: Bilateral;  bilat lower extrem angio   Family History  Problem Relation Age of Onset  . Malignant hyperthermia Neg Hx   . Diabetes Mother   . Hyperlipidemia Mother   . Hypertension Mother   . Heart attack Mother   . Breast cancer Sister   . Diabetes Sister   . Hyperlipidemia Sister   . Hypertension Sister   . Heart attack Sister   .  Hyperlipidemia Sister   . Hypertension Sister   . Heart disease Sister     Heart Disease before age 69  . Diabetes Brother   . Asthma Sister    History  Substance Use Topics  . Smoking status: Former Smoker -- 0.50 packs/day for 15 years    Types: Cigarettes, Cigars    Quit date: 10/15/1963  . Smokeless tobacco: Never Used  . Alcohol Use: No    Review of Systems  Respiratory: Positive for shortness of breath.   All other systems reviewed and are negative.     Allergies  Clopidogrel bisulfate  Home Medications   Prior to Admission medications   Medication Sig Start Date End Date Taking? Authorizing Provider  albuterol (PROVENTIL HFA;VENTOLIN HFA) 108 (90 BASE) MCG/ACT inhaler Inhale 2 puffs into the lungs every 6 (six) hours as needed for wheezing or shortness of breath (wheezing).     Historical Provider, MD  albuterol (PROVENTIL) (2.5 MG/3ML) 0.083% nebulizer solution Take 3 mLs (2.5 mg total) by nebulization every 4 (four) hours as needed for wheezing or shortness of breath. 07/14/14   Theodis Blaze, MD  aspirin EC 81 MG tablet Take 81 mg by mouth daily.    Historical Provider, MD  budesonide-formoterol (SYMBICORT) 80-4.5 MCG/ACT inhaler Inhale 2 puffs into the lungs 2 (two) times daily as needed (wheezing). FOR WHEEZING    Historical Provider, MD  ezetimibe-simvastatin (VYTORIN) 10-20 MG per tablet Take 0.5 tablets by mouth daily. 07/08/14   Sueanne Margarita, MD  glimepiride (AMARYL) 2 MG tablet Take 2 mg by mouth daily.      Historical Provider, MD  guaiFENesin-dextromethorphan (ROBITUSSIN DM) 100-10 MG/5ML syrup Take 5 mLs by mouth every 4 (four) hours as needed for cough. 07/14/14   Theodis Blaze, MD  isosorbide mononitrate (IMDUR) 60 MG 24 hr tablet Take 60 mg by mouth daily.    Historical Provider, MD  levothyroxine (SYNTHROID, LEVOTHROID) 200 MCG tablet Take 200 mcg by mouth daily before breakfast.    Historical Provider, MD  levothyroxine (SYNTHROID, LEVOTHROID) 75 MCG  tablet Take 75 mcg by mouth daily. 10/04/14   Historical Provider, MD  loperamide (IMODIUM A-D) 2 MG tablet Take 2 mg by mouth 4 (four) times daily as needed (loose stools). For diarrhea.    Historical Provider, MD  metoprolol succinate (TOPROL-XL) 50 MG 24 hr tablet Take 50 mg by mouth daily. 11/07/14   Historical Provider, MD  nitroGLYCERIN (NITROSTAT) 0.4 MG SL tablet Place 0.4 mg under the tongue every 5 (five) minutes x 3 doses as needed. For chest pain.    Historical Provider, MD  NON FORMULARY Sleeps with CPAP Machine    Historical Provider, MD  pantoprazole (PROTONIX) 40 MG tablet Take 1 tablet (40 mg total) by mouth 2 (two) times daily. 10/13/14   Sueanne Margarita, MD   BP 179/99 mmHg  Temp(Src)  98.2 F (36.8 C) (Oral)  Resp 22  Ht 5\' 3"  (1.6 m)  Wt 217 lb (98.431 kg)  BMI 38.45 kg/m2  SpO2 96% Physical Exam  Constitutional: He is oriented to person, place, and time. He appears well-developed and well-nourished.  HENT:  Head: Normocephalic and atraumatic.  Cardiovascular: Normal rate and regular rhythm.   No murmur heard. Pulmonary/Chest: Effort normal and breath sounds normal. No respiratory distress.  Abdominal: Soft. There is no tenderness. There is no rebound and no guarding.  Musculoskeletal: He exhibits no edema.  BLE edema, 2-3+ pitting L greater than R  Neurological: He is alert and oriented to person, place, and time.  Skin: Skin is warm and dry.  Psychiatric: He has a normal mood and affect. His behavior is normal.  Nursing note and vitals reviewed.   ED Course  Procedures (including critical care time) Labs Review Labs Reviewed  BASIC METABOLIC PANEL - Abnormal; Notable for the following:    GFR calc non Af Amer 61 (*)    GFR calc Af Amer 70 (*)    All other components within normal limits  I-STAT ARTERIAL BLOOD GAS, ED - Abnormal; Notable for the following:    pCO2 arterial 52.4 (*)    pO2, Arterial 67.0 (*)    Bicarbonate 29.0 (*)    All other components  within normal limits  CULTURE, EXPECTORATED SPUTUM-ASSESSMENT  CBC  BRAIN NATRIURETIC PEPTIDE  TROPONIN I  BLOOD GAS, ARTERIAL  COMPREHENSIVE METABOLIC PANEL  CBC  PROTIME-INR  HEMOGLOBIN A1C  TROPONIN I  TROPONIN I  TROPONIN I    Imaging Review Dg Chest 2 View  11/16/2014   CLINICAL DATA:  Shortness of breath  EXAM: CHEST  2 VIEW  COMPARISON:  07/13/2014  FINDINGS: There is chronic pulmonary hyperinflation consistent with COPD. Bandlike opacities in the lower lungs consistent with scar. There is no edema, consolidation, effusion, or pneumothorax.  No cardiomegaly; stable aortic and hilar contours. Coronary stents noted.  Re- demonstrated calcified granuloma in the left mid lung.  IMPRESSION: COPD and mild lung scarring.  No acute superimposed finding.   Electronically Signed   By: Jorje Guild M.D.   On: 11/16/2014 17:56     EKG Interpretation None      MDM   Final diagnoses:  Shortness of breath  Dyspnea   Patient referred from the cardiology office for evaluation of shortness of breath. During conversation in the emergency department with patient he did have hypoxia with sats down to the 80s. Presented to get a CT PE study and he was unable to lay supine for this. Discussed with medicine regarding admission for further workup of hypoxia and shortness of breath. Patient was given a dose of Lovenox 1 time for possible DVT/PE.  Quintella Reichert, MD 11/16/14 [id number]

## 2014-11-16 NOTE — ED Notes (Signed)
Patient given warm blankets and made aware of the plan of care.

## 2014-11-16 NOTE — ED Notes (Signed)
Patient returned from X-ray 

## 2014-11-17 ENCOUNTER — Observation Stay (HOSPITAL_COMMUNITY): Payer: Commercial Managed Care - HMO

## 2014-11-17 ENCOUNTER — Other Ambulatory Visit: Payer: Commercial Managed Care - HMO

## 2014-11-17 ENCOUNTER — Encounter (HOSPITAL_COMMUNITY): Payer: Self-pay

## 2014-11-17 DIAGNOSIS — R791 Abnormal coagulation profile: Secondary | ICD-10-CM | POA: Diagnosis not present

## 2014-11-17 DIAGNOSIS — I509 Heart failure, unspecified: Secondary | ICD-10-CM | POA: Diagnosis not present

## 2014-11-17 DIAGNOSIS — K219 Gastro-esophageal reflux disease without esophagitis: Secondary | ICD-10-CM | POA: Diagnosis not present

## 2014-11-17 DIAGNOSIS — I739 Peripheral vascular disease, unspecified: Secondary | ICD-10-CM | POA: Diagnosis not present

## 2014-11-17 DIAGNOSIS — R0902 Hypoxemia: Secondary | ICD-10-CM | POA: Diagnosis not present

## 2014-11-17 DIAGNOSIS — M7989 Other specified soft tissue disorders: Secondary | ICD-10-CM | POA: Diagnosis not present

## 2014-11-17 DIAGNOSIS — J8410 Pulmonary fibrosis, unspecified: Secondary | ICD-10-CM | POA: Diagnosis not present

## 2014-11-17 DIAGNOSIS — F419 Anxiety disorder, unspecified: Secondary | ICD-10-CM | POA: Diagnosis not present

## 2014-11-17 DIAGNOSIS — I2510 Atherosclerotic heart disease of native coronary artery without angina pectoris: Secondary | ICD-10-CM | POA: Diagnosis not present

## 2014-11-17 DIAGNOSIS — J441 Chronic obstructive pulmonary disease with (acute) exacerbation: Secondary | ICD-10-CM | POA: Diagnosis not present

## 2014-11-17 DIAGNOSIS — R0602 Shortness of breath: Secondary | ICD-10-CM

## 2014-11-17 DIAGNOSIS — K5792 Diverticulitis of intestine, part unspecified, without perforation or abscess without bleeding: Secondary | ICD-10-CM | POA: Diagnosis not present

## 2014-11-17 DIAGNOSIS — I5032 Chronic diastolic (congestive) heart failure: Secondary | ICD-10-CM | POA: Diagnosis not present

## 2014-11-17 DIAGNOSIS — G4733 Obstructive sleep apnea (adult) (pediatric): Secondary | ICD-10-CM | POA: Diagnosis not present

## 2014-11-17 DIAGNOSIS — E669 Obesity, unspecified: Secondary | ICD-10-CM | POA: Diagnosis not present

## 2014-11-17 LAB — COMPREHENSIVE METABOLIC PANEL
ALBUMIN: 4.1 g/dL (ref 3.5–5.2)
ALT: 20 U/L (ref 0–53)
AST: 26 U/L (ref 0–37)
Alkaline Phosphatase: 43 U/L (ref 39–117)
Anion gap: 8 (ref 5–15)
BILIRUBIN TOTAL: 0.5 mg/dL (ref 0.3–1.2)
BUN: 13 mg/dL (ref 6–23)
CALCIUM: 9.3 mg/dL (ref 8.4–10.5)
CO2: 30 mmol/L (ref 19–32)
Chloride: 101 mmol/L (ref 96–112)
Creatinine, Ser: 1.14 mg/dL (ref 0.50–1.35)
GFR calc Af Amer: 67 mL/min — ABNORMAL LOW (ref >90)
GFR calc non Af Amer: 58 mL/min — ABNORMAL LOW (ref >90)
GLUCOSE: 90 mg/dL (ref 70–99)
Potassium: 3.9 mmol/L (ref 3.5–5.1)
Sodium: 139 mmol/L (ref 135–145)
Total Protein: 6.8 g/dL (ref 6.0–8.3)

## 2014-11-17 LAB — TROPONIN I: Troponin I: <0.03 ng/mL (ref <0.031)

## 2014-11-17 LAB — CBC
HEMATOCRIT: 41.9 % (ref 39.0–52.0)
Hemoglobin: 13.7 g/dL (ref 13.0–17.0)
MCH: 28.8 pg (ref 26.0–34.0)
MCHC: 32.7 g/dL (ref 30.0–36.0)
MCV: 88 fL (ref 78.0–100.0)
Platelets: 157 10*3/uL (ref 150–400)
RBC: 4.76 MIL/uL (ref 4.22–5.81)
RDW: 13.8 % (ref 11.5–15.5)
WBC: 7.6 10*3/uL (ref 4.0–10.5)

## 2014-11-17 LAB — PROTIME-INR
INR: 1.06 (ref 0.00–1.49)
PROTHROMBIN TIME: 13.9 s (ref 11.6–15.2)

## 2014-11-17 LAB — GLUCOSE, CAPILLARY
GLUCOSE-CAPILLARY: 132 mg/dL — AB (ref 70–99)
Glucose-Capillary: 102 mg/dL — ABNORMAL HIGH (ref 70–99)

## 2014-11-17 LAB — TSH: TSH: 9.229 u[IU]/mL — ABNORMAL HIGH (ref 0.350–4.500)

## 2014-11-17 MED ORDER — HEPARIN SODIUM (PORCINE) 5000 UNIT/ML IJ SOLN: 5000.0000 [IU] | Freq: Three times a day (TID) | INTRAMUSCULAR | Status: DC

## 2014-11-17 MED ORDER — IOHEXOL 350 MG/ML SOLN
100.0000 mL | Freq: Once | INTRAVENOUS | Status: AC | PRN
  Administered 2014-11-17: 100 mL via INTRAVENOUS

## 2014-11-17 MED ORDER — FUROSEMIDE 20 MG PO TABS: 40.0000 mg | ORAL_TABLET | Freq: Every day | ORAL | Status: DC

## 2014-11-17 MED ORDER — FUROSEMIDE 20 MG PO TABS: 20.0000 mg | ORAL_TABLET | Freq: Every day | ORAL | Status: DC

## 2014-11-17 NOTE — Progress Notes (Signed)
Pt on CPAP during CT scan with a pressure of 5cmH2O and 30% O2 bleed in. Pt tolerated it well, with no complications. Pt taken off CPAP for transfer and placed back on in the room. Pt tolerating well at this time. RT will continue to monitor.

## 2014-11-17 NOTE — Progress Notes (Signed)
ANTICOAGULATION CONSULT NOTE - Initial Consult  Pharmacy Consult for Lovenox Indication: r/o PE  Allergies  Allergen Reactions  . Clopidogrel Bisulfate Itching    Patient Measurements: Height: 5\' 3"  (160 cm) Weight: 212 lb 6.4 oz (96.344 kg) IBW/kg (Calculated) : 56.9  Vital Signs: Temp: 98.4 F (36.9 C) (02/03 2341) Temp Source: Oral (02/03 2341) BP: 178/74 mmHg (02/03 2341) Pulse Rate: 80 (02/03 2341)  Labs:  Recent Labs  11/16/14 1124 11/16/14 1648  HGB  --  13.5  HCT  --  40.1  PLT  --  158  CREATININE 1.02 1.10  TROPONINI  --  <0.03    Estimated Creatinine Clearance: 53.2 mL/min (by C-G formula based on Cr of 1.1).   Medical History: Past Medical History  Diagnosis Date  . Diabetes mellitus   . Asthma   . Hypertension   . Hypercholesterolemia   . Hypothyroidism   . Anxiety   . COPD (chronic obstructive pulmonary disease)   . GERD (gastroesophageal reflux disease)   . Sleep apnea     severe OSA awaiting CPAP titration  . Peripheral vascular disease 11/2006    s/p left stent  . Shortness of breath   . DVT (deep venous thrombosis)   . Diverticulitis Oct. 2013    bleeding in the past and Effient for his CAD was stopped  . Coronary artery disease     s/p PCI of RCA 03/2009 at which time there was 70% ramus and 90% RCA, cath 01/2011 showed patent stents in RCA with moderate prox disesae, aneurysmal left circ and small 90% ramus s/p PCI, patent LAD EF 55%  . Chronic diastolic CHF (congestive heart failure)     diastolic   . OSA (obstructive sleep apnea)     severe on CPAP  . Obesity (BMI 30-39.9)     Medications:  Prescriptions prior to admission  Medication Sig Dispense Refill Last Dose  . albuterol (PROVENTIL HFA;VENTOLIN HFA) 108 (90 BASE) MCG/ACT inhaler Inhale 2 puffs into the lungs every 6 (six) hours as needed for wheezing or shortness of breath (wheezing).    Past Week at Unknown time  . albuterol (PROVENTIL) (2.5 MG/3ML) 0.083% nebulizer  solution Take 3 mLs (2.5 mg total) by nebulization every 4 (four) hours as needed for wheezing or shortness of breath. 75 mL 12 unknown  . aspirin EC 81 MG tablet Take 81 mg by mouth daily.   11/16/2014 at Unknown time  . budesonide-formoterol (SYMBICORT) 80-4.5 MCG/ACT inhaler Inhale 2 puffs into the lungs 2 (two) times daily as needed (wheezing). FOR WHEEZING   11/16/2014 at Unknown time  . ezetimibe-simvastatin (VYTORIN) 10-20 MG per tablet Take 0.5 tablets by mouth daily. 30 tablet 6 11/16/2014 at Unknown time  . glimepiride (AMARYL) 2 MG tablet Take 2 mg by mouth daily.     11/16/2014 at Unknown time  . isosorbide mononitrate (IMDUR) 60 MG 24 hr tablet Take 60 mg by mouth daily.   11/16/2014 at Unknown time  . levothyroxine (SYNTHROID, LEVOTHROID) 200 MCG tablet Take 200 mcg by mouth daily before breakfast. Take with 70mcg for a total of 275mg  daily   11/15/2014 at Unknown time  . levothyroxine (SYNTHROID, LEVOTHROID) 75 MCG tablet Take 75 mcg by mouth daily. Take with 22mcg for a total of 275mg  daily   11/15/2014 at Unknown time  . loperamide (IMODIUM A-D) 2 MG tablet Take 2 mg by mouth 4 (four) times daily as needed (loose stools). For diarrhea.   11/15/2014 at Unknown time  .  metoprolol succinate (TOPROL-XL) 50 MG 24 hr tablet Take 50 mg by mouth daily.  11 11/16/2014 at 100 pm  . nitroGLYCERIN (NITROSTAT) 0.4 MG SL tablet Place 0.4 mg under the tongue every 5 (five) minutes x 3 doses as needed. For chest pain.   unknown  . NON FORMULARY Sleeps with CPAP Machine   unknown  . pantoprazole (PROTONIX) 40 MG tablet Take 1 tablet (40 mg total) by mouth 2 (two) times daily. 60 tablet 3 11/16/2014 at Unknown time  . guaiFENesin-dextromethorphan (ROBITUSSIN DM) 100-10 MG/5ML syrup Take 5 mLs by mouth every 4 (four) hours as needed for cough. (Patient not taking: Reported on 11/16/2014) 118 mL 0 Taking   Scheduled:  . ALPRAZolam  0.25 mg Oral Once  . aspirin EC  81 mg Oral Daily  . budesonide-formoterol  2 puff  Inhalation BID  . enoxaparin (LOVENOX) injection  100 mg Subcutaneous Q12H  . ezetimibe-simvastatin  0.5 tablet Oral Daily  . insulin aspart  0-15 Units Subcutaneous TID WC  . insulin aspart  0-5 Units Subcutaneous QHS  . isosorbide mononitrate  60 mg Oral Daily  . metoprolol succinate  50 mg Oral Daily  . pantoprazole  40 mg Oral BID  . sodium chloride  3 mL Intravenous Q12H    Assessment: 81yo male c/o SOB x3-4d w/ swollen BLE, D-dimer elevated, concern for PE, CT pending, to begin anticoagulation.  Goal of Therapy:  Anti-Xa level 0.6-1 units/ml 4hrs after LMWH dose given Monitor platelets by anticoagulation protocol: Yes   Plan:  Will begin Lovenox 100mg  SQ Q12H and monitor CBC and f/u regarding long-term anticoag if VTE confirmed.  Wynona Neat, PharmD, BCPS  11/17/2014,12:06 AM

## 2014-11-17 NOTE — Progress Notes (Signed)
  Echocardiogram 2D Echocardiogram has been performed.  [name] 11/17/2014, 3:11 PM

## 2014-11-17 NOTE — Discharge Summary (Signed)
[name], 81 y.o., DOB [birth year], MRN [id number]. Admission date: 11/16/2014 Discharge Date 11/17/2014 Primary MD [name], MD Admitting Physician Berle Mull, MD   PCP please follow on: - Please check BMP during next visit, as patient has been started on low dose Lasix due to diastolic CHF. - Will need cardiology follow-up 2-3 weeks from discharge.  Admission Diagnosis  Shortness of breath [R06.02] Dyspnea [R06.00] SOB (shortness of breath) [R06.02] Leg swelling [M79.89] Hypoxia [R09.02] Positive D dimer [R79.1]  Discharge Diagnosis   Principal Problem:   Hypoxia Active Problems:   HYPERTENSION, BENIGN   WEIGHT GAIN, ABNORMAL   DEEP VENOUS THROMBOPHLEBITIS, LEFT, LEG, HX OF   Coronary artery disease   OSA (obstructive sleep apnea)   COPD GOLD III   Chronic diastolic CHF (congestive heart failure)   Edema extremities      Past Medical History  Diagnosis Date  . Diabetes mellitus   . Asthma   . Hypertension   . Hypercholesterolemia   . Hypothyroidism   . Anxiety   . COPD (chronic obstructive pulmonary disease)   . GERD (gastroesophageal reflux disease)   . Sleep apnea     severe OSA awaiting CPAP titration  . Peripheral vascular disease 11/2006    s/p left stent  . Shortness of breath   . DVT (deep venous thrombosis)   . Diverticulitis Oct. 2013    bleeding in the past and Effient for his CAD was stopped  . Coronary artery disease     s/p PCI of RCA 03/2009 at which time there was 70% ramus and 90% RCA, cath 01/2011 showed patent stents in RCA with moderate prox disesae, aneurysmal left circ and small 90% ramus s/p PCI, patent LAD EF 55%  . Chronic diastolic CHF (congestive heart failure)     diastolic   . OSA (obstructive sleep apnea)     severe on CPAP  . Obesity (BMI 30-39.9)     Past Surgical History  Procedure Laterality Date  . Coronary stent placement  2010 and 2012  . Hernia repair  1992    evntral hernia repair  . Esophagogastroduodenoscopy   12/26/2011    Procedure: ESOPHAGOGASTRODUODENOSCOPY (EGD);  Surgeon: Garlan Fair, MD;  Location: [facility];  Service: Endoscopy;  Laterality: N/A;  . Balloon dilation  12/26/2011    Procedure: BALLOON DILATION;  Surgeon: Garlan Fair, MD;  Location: [facility];  Service: Endoscopy;  Laterality: N/A;  . Esophagogastroduodenoscopy  08/13/2012    Procedure: ESOPHAGOGASTRODUODENOSCOPY (EGD);  Surgeon: Arta Silence, MD;  Location: [facility];  Service: Endoscopy;  Laterality: Left;  . Colonoscopy  08/14/2012    Procedure: COLONOSCOPY;  Surgeon: Arta Silence, MD;  Location: [facility];  Service: Endoscopy;  Laterality: N/A;  . Abdominal aortagram N/A 04/20/2012    Procedure: ABDOMINAL AORTAGRAM;  Surgeon: Angelia Mould, MD;  Location: [facility] CATH LAB;  Service: Cardiovascular;  Laterality: N/A;  . Abdominal aortagram N/A 12/29/2012    Procedure: ABDOMINAL Maxcine Ham;  Surgeon: Serafina Mitchell, MD;  Location: [facility] CATH LAB;  Service: Cardiovascular;  Laterality: N/A;  . Lower extremity angiogram Bilateral 12/29/2012    Procedure: LOWER EXTREMITY ANGIOGRAM;  Surgeon: Serafina Mitchell, MD;  Location: [facility] CATH LAB;  Service: Cardiovascular;  Laterality: Bilateral;  bilat lower extrem angio     Hospital Course See H&P, Labs, Consult and Test reports for all details in brief, patient was admitted for **  Principal Problem:   Hypoxia Active Problems:   HYPERTENSION, BENIGN   WEIGHT  GAIN, ABNORMAL   DEEP VENOUS THROMBOPHLEBITIS, LEFT, LEG, HX OF   Coronary artery disease   OSA (obstructive sleep apnea)   COPD GOLD III   Chronic diastolic CHF (congestive heart failure)   Edema extremities  [name] is a 81 y.o. male with Past medical history of diabetes mellitus, COPD, essential hypertension, hypothyroidism, GERD, severe sleep apnea on C Pap, peripheral vascular disease, history of DVT, coronary artery disease, chronic diastolic heart failure. The patient is presenting with  complaints of 3 month history of progressively worsening shortness of breath. He mentions that he was at his baseline a few months ago but since last 3 months he has noted that he has been having progressively worsening shortness of breath. Initially the shortness of breath was when he was "rushing through things" but now he is even short of breath at rest. He denies any dizziness or lightheadedness. He denies any syncopal episode. He denies any chest pain chest heaviness/tightness. He has a cough and has not had any sputum production. Denies any fever or chills. Denies any travel outside of [place]. Denies any recent travel or immobilization. Patient had left lower extremity venous Doppler done by cardiology at day of admission 11/16/14, which was negative for any DVTs, patient was admitted for further workup, lower extremity venous Doppler was done and was negative for DVT, she initially was not able to lay supine for CT chest angiogram to rule out PE, thickened improvement of respiratory status after receiving 80 mg of Lasix, CT chest angiogram was negative for PE, 2-D echo was done showing EF 55%, but grade 1 diastolic dysfunction.  Acute hypoxic respiratory failure - resolved, currently tolerating room air, reports most of his symptoms with laying supine. - Secondary to grade 1 diastolic dysfunction, will be discharged on Lasix 40 mg oral daily for cardiology in 2 weeks from discharge. - right lower extremity venous Doppler is negative for DVT, outpatient venous Doppler done yesterday negative DVT for left lower extremity. - CT chest angiogram negative for PE  Acute diastolic CHF - patient  will be started on Lasix 40 mg oral daily on discharge.  Obstructive sleep apnea - Continue with C Pap  Diabetes mellitus - Resume home medication  Hypertension - Resume home home medication  Consults   None  Significant Tests:  See full reports for all details    Dg Chest 2 View  11/16/2014    CLINICAL DATA:  Shortness of breath  EXAM: CHEST  2 VIEW  COMPARISON:  07/13/2014  FINDINGS: There is chronic pulmonary hyperinflation consistent with COPD. Bandlike opacities in the lower lungs consistent with scar. There is no edema, consolidation, effusion, or pneumothorax.  No cardiomegaly; stable aortic and hilar contours. Coronary stents noted.  Re- demonstrated calcified granuloma in the left mid lung.  IMPRESSION: COPD and mild lung scarring.  No acute superimposed finding.   Electronically Signed   By: Jorje Guild M.D.   On: 11/16/2014 17:56   Ct Angio Chest Pe W/cm &/or Wo Cm  11/17/2014   CLINICAL DATA:  Acute onset of shortness of breath, hypoxia and elevated D-dimer. Leg swelling. Initial encounter.  EXAM: CT ANGIOGRAPHY CHEST WITH CONTRAST  TECHNIQUE: Multidetector CT imaging of the chest was performed using the standard protocol during bolus administration of intravenous contrast. Multiplanar CT image reconstructions and MIPs were obtained to evaluate the vascular anatomy.  CONTRAST:  171mL OMNIPAQUE IOHEXOL 350 MG/ML SOLN  COMPARISON:  Chest radiograph performed 11/16/2014, and CTA of the  chest performed 01/05/2011  FINDINGS: There is no evidence of pulmonary embolus.  A large calcified granuloma is noted at the left mid lung zone. There is mild atelectasis or scarring at the lingula, and at the right middle lobe. There is no evidence of significant focal consolidation, pleural effusion or pneumothorax. No masses are identified; no abnormal focal contrast enhancement is seen.  There is relatively diffuse coronary artery calcification. The mediastinum is otherwise unremarkable. No mediastinal lymphadenopathy is seen. No pericardial effusion is identified. Minimal calcific atherosclerotic disease is noted along the proximal great vessels. No axillary lymphadenopathy is seen. The thyroid gland is not well characterized.  A 3.1 x 1.2 cm focus of irregular soft plaque is noted along the distal  descending thoracic aorta, without significant luminal narrowing. Scattered calcification is noted at the origins of the renal arteries bilaterally.  The visualized portions of the liver and spleen are unremarkable. The visualized portions of the pancreas, gallbladder, stomach, adrenal glands and kidneys are within normal limits. Scattered diverticulosis is noted at the splenic flexure of the colon.  No acute osseous abnormalities are seen. Lucencies within the thoracic lumbar vertebral bodies are thought reflect prominent Schmorl's nodes, on correlation with prior CTA.  Review of the MIP images confirms the above findings.  IMPRESSION: 1. No evidence of pulmonary embolus. 2. 3.1 x 1.2 cm focus of irregular soft plaque noted along the distal descending thoracic aorta, without significant luminal narrowing; this is grossly stable from 2011. 3. Mild atelectasis or scarring in the lingula and right middle lobe. Calcified granuloma at the left midlung zone. Lungs otherwise clear. 4. Relatively diffuse coronary artery calcification. 5. Scattered calcification at the origins of the renal arteries bilaterally. 6. Scattered diverticulosis at the splenic flexure of the colon.   Electronically Signed   By: Garald Balding M.D.   On: 11/17/2014 03:01     Today   Subjective:   [name] today has no headache,no chest abdominal pain,no new weakness tingling or numbness, feels much better already.  Objective:   Blood pressure 122/61, pulse 72, temperature 98.2 F (36.8 C), temperature source Oral, resp. rate 18, height 5\' 3"  (1.6 m), weight 96.344 kg (212 lb 6.4 oz), SpO2 95 %.  Intake/Output Summary (Last 24 hours) at 11/17/14 1608 Last data filed at 11/17/14 1100  Gross per 24 hour  Intake    600 ml  Output   1575 ml  Net   -975 ml    Exam Awake Alert, Oriented *3, No new F.N deficits, Normal affect [place].AT,PERRAL Supple Neck,No JVD, No cervical lymphadenopathy appriciated.  Symmetrical Chest wall  movement, Good air movement bilaterally, CTAB RRR,No Gallops,Rubs or new Murmurs, No Parasternal Heave +ve B.Sounds, Abd Soft, Non tender, No organomegaly appriciated, No rebound -guarding or rigidity. No Cyanosis, Clubbing or edema, No new Rash or bruise  Data Review   Cultures -   CBC w Diff:  Lab Results  Component Value Date   WBC 7.6 11/17/2014   HGB 13.7 11/17/2014   HCT 41.9 11/17/2014   PLT 157 11/17/2014   LYMPHOPCT 9* 07/12/2014   MONOPCT 5 07/12/2014   EOSPCT 0 07/12/2014   BASOPCT 0 07/12/2014   CMP:  Lab Results  Component Value Date   NA 139 11/17/2014   K 3.9 11/17/2014   CL 101 11/17/2014   CO2 30 11/17/2014   BUN 13 11/17/2014   CREATININE 1.14 11/17/2014   PROT 6.8 11/17/2014   ALBUMIN 4.1 11/17/2014   BILITOT 0.5 11/17/2014  ALKPHOS 43 11/17/2014   AST 26 11/17/2014   ALT 20 11/17/2014  .  Micro Results No results found for this or any previous visit (from the past 240 hour(s)).   Discharge Instructions          Follow-up Information    Follow up with SHAW,KIMBERLEE, MD. Schedule an appointment as soon as possible for a visit in 1 week.   Specialty:  Family Medicine   Why:  post hospitalization visit.   Contact information:   [address] [place] [address] [phone]       Follow up with Sueanne Margarita, MD.   Specialty:  Cardiology   Why:  diastolic CHF   Contact information:   [address] [place] [address] [phone]       Discharge Medications     Medication List    TAKE these medications        albuterol (2.5 MG/3ML) 0.083% nebulizer solution  Commonly known as:  PROVENTIL  Take 3 mLs (2.5 mg total) by nebulization every 4 (four) hours as needed for wheezing or shortness of breath.     albuterol 108 (90 BASE) MCG/ACT inhaler  Commonly known as:  PROVENTIL HFA;VENTOLIN HFA  Inhale 2 puffs into the lungs every 6 (six) hours as needed for wheezing or shortness of breath  (wheezing).     aspirin EC 81 MG tablet  Take 81 mg by mouth daily.     budesonide-formoterol 80-4.5 MCG/ACT inhaler  Commonly known as:  SYMBICORT  Inhale 2 puffs into the lungs 2 (two) times daily as needed (wheezing). FOR WHEEZING     ezetimibe-simvastatin 10-20 MG per tablet  Commonly known as:  VYTORIN  Take 0.5 tablets by mouth daily.     furosemide 20 MG tablet  Commonly known as:  LASIX  Take 2 tablets (40 mg total) by mouth daily.     glimepiride 2 MG tablet  Commonly known as:  AMARYL  Take 2 mg by mouth daily.     guaiFENesin-dextromethorphan 100-10 MG/5ML syrup  Commonly known as:  ROBITUSSIN DM  Take 5 mLs by mouth every 4 (four) hours as needed for cough.     isosorbide mononitrate 60 MG 24 hr tablet  Commonly known as:  IMDUR  Take 60 mg by mouth daily.     levothyroxine 200 MCG tablet  Commonly known as:  SYNTHROID, LEVOTHROID  Take 200 mcg by mouth daily before breakfast. Take with 10mcg for a total of 275mg  daily     levothyroxine 75 MCG tablet  Commonly known as:  SYNTHROID, LEVOTHROID  Take 75 mcg by mouth daily. Take with 251mcg for a total of 275mg  daily     loperamide 2 MG tablet  Commonly known as:  IMODIUM A-D  Take 2 mg by mouth 4 (four) times daily as needed (loose stools). For diarrhea.     metoprolol succinate 50 MG 24 hr tablet  Commonly known as:  TOPROL-XL  Take 50 mg by mouth daily.     nitroGLYCERIN 0.4 MG SL tablet  Commonly known as:  NITROSTAT  Place 0.4 mg under the tongue every 5 (five) minutes x 3 doses as needed. For chest pain.     NON FORMULARY  Sleeps with CPAP Machine     pantoprazole 40 MG tablet  Commonly known as:  PROTONIX  Take 1 tablet (40 mg total) by mouth 2 (two) times daily.  Total Time in preparing paper work, data evaluation and todays exam - 35 minutes  Jesse Nosbisch M.D on 11/17/2014 at 4:08 PM  [place]  [phone]

## 2014-11-17 NOTE — Discharge Instructions (Signed)
Follow with Primary MD [name], MD in 7 days   Get CBC, CMP, 2 view Chest X ray checked  by Primary MD next visit.    Activity: As tolerated with Full fall precautions use walker/cane & assistance as needed   Disposition Home    Diet: Heart Healthy ,low sodium, with feeding assistance and aspiration precautions as needed.  For Heart failure patients - Check your Weight same time everyday, if you gain over 2 pounds, or you develop in leg swelling, experience more shortness of breath or chest pain, call your Primary MD immediately. Follow Cardiac Low Salt Diet and 1.8 lit/day fluid restriction.   On your next visit with your primary care physician please Get Medicines reviewed and adjusted.   Please request your Prim.MD to go over all Hospital Tests and Procedure/Radiological results at the follow up, please get all Hospital records sent to your Prim MD by signing hospital release before you go home.   If you experience worsening of your admission symptoms, develop shortness of breath, life threatening emergency, suicidal or homicidal thoughts you must seek medical attention immediately by calling 911 or calling your MD immediately  if symptoms less severe.  You Must read complete instructions/literature along with all the possible adverse reactions/side effects for all the Medicines you take and that have been prescribed to you. Take any new Medicines after you have completely understood and accpet all the possible adverse reactions/side effects.   Do not drive, operating heavy machinery, perform activities at heights, swimming or participation in water activities or provide baby sitting services if your were admitted for syncope or siezures until you have seen by Primary MD or a Neurologist and advised to do so again.  Do not drive when taking Pain medications.    Do not take more than prescribed Pain, Sleep and Anxiety Medications  Special Instructions: If you have smoked or  chewed Tobacco  in the last 2 yrs please stop smoking, stop any regular Alcohol  and or any Recreational drug use.  Wear Seat belts while driving.   Please note  You were cared for by a hospitalist during your hospital stay. If you have any questions about your discharge medications or the care you received while you were in the hospital after you are discharged, you can call the unit and asked to speak with the hospitalist on call if the hospitalist that took care of you is not available. Once you are discharged, your primary care physician will handle any further medical issues. Please note that NO REFILLS for any discharge medications will be authorized once you are discharged, as it is imperative that you return to your primary care physician (or establish a relationship with a primary care physician if you do not have one) for your aftercare needs so that they can reassess your need for medications and monitor your lab values.

## 2014-11-17 NOTE — Progress Notes (Signed)
VASCULAR LAB PRELIMINARY  PRELIMINARY  PRELIMINARY  PRELIMINARY  Right lower extremity venous duplex completed.    Preliminary report:  Right:  No evidence of DVT, superficial thrombosis, or Baker's cyst.  [name], RVT 11/17/2014, 3:25 PM

## 2014-11-18 LAB — HEMOGLOBIN A1C
Hgb A1c MFr Bld: 6.6 % — ABNORMAL HIGH (ref 4.8–5.6)
Mean Plasma Glucose: 143 mg/dL

## 2014-11-18 LAB — T4, FREE: Free T4: 0.69 ng/dL — ABNORMAL LOW (ref 0.80–1.80)

## 2014-11-21 ENCOUNTER — Telehealth: Payer: Self-pay | Admitting: Cardiology

## 2014-11-21 DIAGNOSIS — J449 Chronic obstructive pulmonary disease, unspecified: Secondary | ICD-10-CM | POA: Diagnosis not present

## 2014-11-21 NOTE — Telephone Encounter (Signed)
New Message  Pt daughter calling to make [name]. Per pt- needed to be seen in 2 weeks. D/C notes did not give that time table. She was offered a PA appt, but refused. Pt was given Simon next avail (3/11) but requested to speak w/ Rn about earlier appt. Please call back and discuss.

## 2014-11-21 NOTE — Telephone Encounter (Signed)
Spoke with patient's daughter. Explained to her that Dr. Theodosia Blender schedule is full during the time they want to be seen, but it can be arranged to see a PA or NP on a day that Dr. Radford Pax is here. OV scheduled for 2/22. Both agreeable.

## 2014-11-23 DIAGNOSIS — I5032 Chronic diastolic (congestive) heart failure: Secondary | ICD-10-CM | POA: Diagnosis not present

## 2014-11-24 ENCOUNTER — Encounter: Payer: Self-pay | Admitting: Cardiology

## 2014-11-25 ENCOUNTER — Telehealth: Payer: Self-pay | Admitting: Cardiology

## 2014-11-25 NOTE — Telephone Encounter (Signed)
New message     Please send an order to [facility] for a CPAP mask--resmed made by [employer]--size medium.  The plastic on the last mask split.  Please fax order to [phone].

## 2014-11-28 ENCOUNTER — Telehealth: Payer: Self-pay | Admitting: Cardiology

## 2014-11-28 NOTE — Telephone Encounter (Signed)
New Rx filled and placed in Medical Records "to be faxed" bin.

## 2014-11-28 NOTE — Telephone Encounter (Signed)
Error

## 2014-11-28 NOTE — Telephone Encounter (Signed)
F/U       Pt daughter [name] calling states pt was speaking with someone today about CPAP mask and line disconnected.    Please return call.

## 2014-12-02 ENCOUNTER — Ambulatory Visit (INDEPENDENT_AMBULATORY_CARE_PROVIDER_SITE_OTHER): Payer: Commercial Managed Care - HMO | Admitting: Pharmacist

## 2014-12-02 DIAGNOSIS — R0602 Shortness of breath: Secondary | ICD-10-CM

## 2014-12-02 NOTE — Patient Instructions (Signed)
Continue your current medications.   Try to make dietary changes:  1.  Only eat one starchy food (such as potatoes, corn, bread) at each meal 2.  Increase the high fiber foods in your diet. 3.  Try to snack on nuts such as almonds 4.  Substitute at least 1 glass of tea for water each day.   Recheck labs at your appt with Dr. Radford Pax in August

## 2014-12-05 ENCOUNTER — Ambulatory Visit (INDEPENDENT_AMBULATORY_CARE_PROVIDER_SITE_OTHER): Payer: Commercial Managed Care - HMO | Admitting: Physician Assistant

## 2014-12-05 ENCOUNTER — Ambulatory Visit: Payer: Medicare HMO | Admitting: Physician Assistant

## 2014-12-05 ENCOUNTER — Encounter: Payer: Self-pay | Admitting: Physician Assistant

## 2014-12-05 VITALS — BP 139/55 | HR 70 | Ht 63.0 in | Wt 206.0 lb

## 2014-12-05 DIAGNOSIS — I5032 Chronic diastolic (congestive) heart failure: Secondary | ICD-10-CM

## 2014-12-05 DIAGNOSIS — I2510 Atherosclerotic heart disease of native coronary artery without angina pectoris: Secondary | ICD-10-CM | POA: Diagnosis not present

## 2014-12-05 DIAGNOSIS — I10 Essential (primary) hypertension: Secondary | ICD-10-CM

## 2014-12-05 DIAGNOSIS — N289 Disorder of kidney and ureter, unspecified: Secondary | ICD-10-CM

## 2014-12-05 LAB — BASIC METABOLIC PANEL
BUN: 20 mg/dL (ref 6–23)
CO2: 29 mEq/L (ref 19–32)
CREATININE: 1.01 mg/dL (ref 0.40–1.50)
Calcium: 9.7 mg/dL (ref 8.4–10.5)
Chloride: 105 mEq/L (ref 96–112)
GFR: 90.78 mL/min (ref >60.00)
Glucose, Bld: 101 mg/dL — ABNORMAL HIGH (ref 70–99)
POTASSIUM: 4.2 meq/L (ref 3.5–5.1)
SODIUM: 142 meq/L (ref 135–145)

## 2014-12-05 MED ORDER — POTASSIUM CHLORIDE CRYS ER 20 MEQ PO TBCR: 20.0000 meq | EXTENDED_RELEASE_TABLET | Freq: Every day | ORAL | Status: DC

## 2014-12-05 MED ORDER — FUROSEMIDE 40 MG PO TABS: 40.0000 mg | ORAL_TABLET | Freq: Every day | ORAL | Status: DC

## 2014-12-05 NOTE — Assessment & Plan Note (Signed)
Patient's weight is down 6 pounds since hospitalization. He has mild edema in his ankle otherwise is heart failure seems to be compensated. Had a discussion about low sodium diet which patient struggles with. Continue Lasix 40 mg daily. Add potassium 20 mEq daily. Check renal function today. Follow-up with Dr. Radford Pax in 2 months. Suspect most his dyspnea on exertion at this point is from his COPD.

## 2014-12-05 NOTE — Progress Notes (Signed)
Cardiology Office Note   Date:  12/05/2014   ID:  [name], DOB 1929/12/08, MRN [id number]  PCP:  [name], MD  Cardiologist: Golden Hurter, M.D.  Chief Complaint: Dyspnea on exertion    History of Present Illness: [name] is a 81 y.o. male who presents for post hospital follow-up. He has a history of CAD status post PCI of the RCA in 2010 with cath in 2012 showing patent stent in the RCA with moderate proximal disease, aneurysmal left circumflex and small 90% ramus status post PCI, patent LAD EF 55%. He had been having some chest pain resolved with belching and had a nuclear scan on 10/24/14 that was normal. Dr. Radford Pax saw him on 11/16/14 at which time he was complaining of worsening dyspnea on exertion and lower extremity edema. D-dimer was elevated and lower extremity Dopplers were performed and were negative for DVT he was admitted to the hospital overnight where CT scan was negative for PE. He was diuresed with IV Lasix and sent home on 40 mg daily.  Patient is here today and doing well. He continues to have dyspnea on exertion with very little activity. He has mild ankle edema. He is trying to cut back on his salt. He sees GI on Thursday for his indigestion. He still complaining of pain in his epigastric area usually before after a meal and is relieved with belching.    Past Medical History  Diagnosis Date  . Diabetes mellitus   . Asthma   . Hypertension   . Hypercholesterolemia   . Hypothyroidism   . Anxiety   . COPD (chronic obstructive pulmonary disease)   . GERD (gastroesophageal reflux disease)   . Sleep apnea     severe OSA awaiting CPAP titration  . Peripheral vascular disease 11/2006    s/p left stent  . Shortness of breath   . DVT (deep venous thrombosis)   . Diverticulitis Oct. 2013    bleeding in the past and Effient for his CAD was stopped  . Coronary artery disease     s/p PCI of RCA 03/2009 at which time there was 70% ramus and 90% RCA, cath  01/2011 showed patent stents in RCA with moderate prox disesae, aneurysmal left circ and small 90% ramus s/p PCI, patent LAD EF 55%  . Chronic diastolic CHF (congestive heart failure)     diastolic   . OSA (obstructive sleep apnea)     severe on CPAP  . Obesity (BMI 30-39.9)     Past Surgical History  Procedure Laterality Date  . Coronary stent placement  2010 and 2012  . Hernia repair  1992    evntral hernia repair  . Esophagogastroduodenoscopy  12/26/2011    Procedure: ESOPHAGOGASTRODUODENOSCOPY (EGD);  Surgeon: Garlan Fair, MD;  Location: [facility];  Service: Endoscopy;  Laterality: N/A;  . Balloon dilation  12/26/2011    Procedure: BALLOON DILATION;  Surgeon: Garlan Fair, MD;  Location: [facility];  Service: Endoscopy;  Laterality: N/A;  . Esophagogastroduodenoscopy  08/13/2012    Procedure: ESOPHAGOGASTRODUODENOSCOPY (EGD);  Surgeon: Arta Silence, MD;  Location: [facility];  Service: Endoscopy;  Laterality: Left;  . Colonoscopy  08/14/2012    Procedure: COLONOSCOPY;  Surgeon: Arta Silence, MD;  Location: [facility];  Service: Endoscopy;  Laterality: N/A;  . Abdominal aortagram N/A 04/20/2012    Procedure: ABDOMINAL AORTAGRAM;  Surgeon: Angelia Mould, MD;  Location: [facility] CATH LAB;  Service: Cardiovascular;  Laterality: N/A;  . Abdominal aortagram N/A 12/29/2012  Procedure: ABDOMINAL AORTAGRAM;  Surgeon: Serafina Mitchell, MD;  Location: [facility] CATH LAB;  Service: Cardiovascular;  Laterality: N/A;  . Lower extremity angiogram Bilateral 12/29/2012    Procedure: LOWER EXTREMITY ANGIOGRAM;  Surgeon: Serafina Mitchell, MD;  Location: [facility] CATH LAB;  Service: Cardiovascular;  Laterality: Bilateral;  bilat lower extrem angio     Current Outpatient Prescriptions  Medication Sig Dispense Refill  . aspirin EC 81 MG tablet Take 81 mg by mouth daily.    . budesonide-formoterol (SYMBICORT) 80-4.5 MCG/ACT inhaler Inhale 2 puffs into the lungs 2 (two) times daily as needed  (wheezing). FOR WHEEZING    . ezetimibe-simvastatin (VYTORIN) 10-20 MG per tablet Take 0.5 tablets by mouth daily. 30 tablet 6  . furosemide (LASIX) 20 MG tablet Take 2 tablets (40 mg total) by mouth daily. 30 tablet 0  . glimepiride (AMARYL) 2 MG tablet Take 2 mg by mouth daily.      . isosorbide mononitrate (IMDUR) 60 MG 24 hr tablet Take 60 mg by mouth daily.    Marland Kitchen levothyroxine (SYNTHROID, LEVOTHROID) 200 MCG tablet Take 200 mcg by mouth daily before breakfast. Take with 31mcg for a total of 275mg  daily    . levothyroxine (SYNTHROID, LEVOTHROID) 75 MCG tablet Take 75 mcg by mouth daily. Take with 2106mcg for a total of 275mg  daily    . meloxicam (MOBIC) 15 MG tablet Take 15 mg by mouth daily.    . metoprolol succinate (TOPROL-XL) 50 MG 24 hr tablet Take 50 mg by mouth daily.  11  . NON FORMULARY Sleeps with CPAP Machine    . pantoprazole (PROTONIX) 40 MG tablet Take 1 tablet (40 mg total) by mouth 2 (two) times daily. 60 tablet 3  . albuterol (PROVENTIL HFA;VENTOLIN HFA) 108 (90 BASE) MCG/ACT inhaler Inhale 2 puffs into the lungs every 6 (six) hours as needed for wheezing or shortness of breath (wheezing).     Marland Kitchen albuterol (PROVENTIL) (2.5 MG/3ML) 0.083% nebulizer solution Take 3 mLs (2.5 mg total) by nebulization every 4 (four) hours as needed for wheezing or shortness of breath. (Patient not taking: Reported on 12/05/2014) 75 mL 12  . loperamide (IMODIUM A-D) 2 MG tablet Take 2 mg by mouth 4 (four) times daily as needed (loose stools). For diarrhea.    . nitroGLYCERIN (NITROSTAT) 0.4 MG SL tablet Place 0.4 mg under the tongue every 5 (five) minutes x 3 doses as needed. For chest pain.     No current facility-administered medications for this visit.    Allergies:   Clopidogrel bisulfate    Social History:  The patient  reports that he quit smoking about 51 years ago. His smoking use included Cigarettes and Cigars. He has a 7.5 pack-year smoking history. He has never used smokeless tobacco. He  reports that he does not drink alcohol or use illicit drugs.   Family History:  The patient's family history includes Asthma in his sister; Breast cancer in his sister; Diabetes in his brother, mother, and sister; Heart attack in his mother and sister; Heart disease in his sister; Hyperlipidemia in his mother, sister, and sister; Hypertension in his mother, sister, and sister. There is no history of Malignant hyperthermia.    ROS:  Please see the history of present illness.   Otherwise, review of systems are positive for excessive fatigue, joint swelling.   All other systems are reviewed and negative.    PHYSICAL EXAM: BP 139/55 mmHg  Pulse 70  Ht 5\' 3"  (1.6 m)  Wt  206 lb (93.441 kg)  BMI 36.50 kg/m2 GEN: Obese, well developed, in no acute distress HEENT: normal Neck: no JVD, HJR, carotid bruits, or masses Cardiac: RRR; distant heart sounds, positive S4, no murmurs, rubs, thrill or heave, trace of ankle edema bilaterally left greater than right   Respiratory:  clear to auscultation bilaterally, normal work of breathing GI: soft, nontender, nondistended, + BS MS: no deformity or atrophy Extremities: Trace of ankle edema bilaterally left greater than right, without cyanosis, clubbing, good distal pulses bilaterally.  Skin: warm and dry, no rash Neuro:  Strength and sensation are intact Psych: euthymic mood, full affect   EKG:  EKG is not ordered today.    Recent Labs: 11/16/2014: B Natriuretic Peptide 45.0; Pro B Natriuretic peptide (BNP) 33.0 11/17/2014: ALT 20; BUN 13; Creatinine 1.14; Hemoglobin 13.7; Platelets 157; Potassium 3.9; Sodium 139; TSH 9.229*    Lipid Panel    Component Value Date/Time   CHOL 152 11/16/2014 1124   TRIG 99.0 11/16/2014 1124   HDL 42.60 11/16/2014 1124   CHOLHDL 4 11/16/2014 1124   VLDL 19.8 11/16/2014 1124   LDLCALC 90 11/16/2014 1124      Wt Readings from Last 3 Encounters:  11/16/14 212 lb 6.4 oz (96.344 kg)  11/16/14 217 lb 6.4 oz (98.612  kg)  10/21/14 209 lb (94.802 kg)      Other studies Reviewed: Additional studies/ records that were reviewed today include:  Review of the above records demonstrates: Overall Impression:  Intermediate risk stress nuclear study with a moderate sized and intensity, fixed, inferoapical perfusion defect which is likely scar. No reversible ischemia.  LV Ejection Fraction: 46%.  LV Wall Motion:  inferoapical hypokinesis   Pixie Casino, MD, [facility] Board Certified in Nuclear Cardiology Attending Cardiologist [place]  2-D echo 11/17/14 Study Conclusions  - Left ventricle: The cavity size was normal. Systolic function was   normal. The estimated ejection fraction was in the range of 55%   to 60%. Wall motion was normal; there were no regional wall   motion abnormalities. Doppler parameters are consistent with   abnormal left ventricular relaxation (grade 1 diastolic   dysfunction  CT scan of the chest 11/17/14 IMPRESSION: 1. No evidence of pulmonary embolus. 2. 3.1 x 1.2 cm focus of irregular soft plaque noted along the distal descending thoracic aorta, without significant luminal narrowing; this is grossly stable from 2011. 3. Mild atelectasis or scarring in the lingula and right middle lobe. Calcified granuloma at the left midlung zone. Lungs otherwise clear. 4. Relatively diffuse coronary artery calcification. 5. Scattered calcification at the origins of the renal arteries bilaterally. 6. Scattered diverticulosis at the splenic flexure of the colon.     Electronically Signed   By: Garald Balding M.D.   On: 11/17/2014 03:01     ASSESSMENT AND PLAN:  Chronic diastolic CHF (congestive heart failure) Patient's weight is down 6 pounds since hospitalization. He has mild edema in his ankle otherwise is heart failure seems to be compensated. Had a discussion about low sodium diet which patient struggles with. Continue Lasix 40 mg daily. Add potassium 20 mEq daily. Check renal  function today. Follow-up with Dr. Radford Pax in 2 months. Suspect most his dyspnea on exertion at this point is from his COPD.   Coronary artery disease Stable without chest pain. Recent stress Myoview in January 2016 negative for ischemia.   HYPERTENSION, BENIGN Blood pressure stable.   DYSPNEA Patient continues to have dyspnea on exertion. I suspect a  lot of this is from his COPD. Heart failure is compensated today. Continue Lasix.      Sumner Boast, PA-C  12/05/2014 11:08 AM    [facility] [place], [place], [place]  [address] Phone: [phone]; Fax: [phone]

## 2014-12-05 NOTE — Patient Instructions (Signed)
Your physician has recommended you make the following change in your medication:   START TAKING POTASSIUM 20 MEQ ONCE A DAY   START TAKING LASIX 40 MG ONCE A DAY    LABS TODAY BMET AND POTASSIUM   FOLLOW UP WITH DR TURNER IN 2 MONTHS    Low-Sodium Eating Plan Sodium raises blood pressure and causes water to be held in the body. Getting less sodium from food will help lower your blood pressure, reduce any swelling, and protect your heart, liver, and kidneys. We get sodium by adding salt (sodium chloride) to food. Most of our sodium comes from canned, boxed, and frozen foods. Restaurant foods, fast foods, and pizza are also very high in sodium. Even if you take medicine to lower your blood pressure or to reduce fluid in your body, getting less sodium from your food is important. WHAT IS MY PLAN? Most people should limit their sodium intake to 2,300 mg a day. Your health care provider recommends that you limit your sodium intake to __________ a day.  WHAT DO I NEED TO KNOW ABOUT THIS EATING PLAN? For the low-sodium eating plan, you will follow these general guidelines:  Choose foods with a % Daily Value for sodium of less than 5% (as listed on the food label).   Use salt-free seasonings or herbs instead of table salt or sea salt.   Check with your health care provider or pharmacist before using salt substitutes.   Eat fresh foods.  Eat more vegetables and fruits.  Limit canned vegetables. If you do use them, rinse them well to decrease the sodium.   Limit cheese to 1 oz (28 g) per day.   Eat lower-sodium products, often labeled as "lower sodium" or "no salt added."  Avoid foods that contain monosodium glutamate (MSG). MSG is sometimes added to [place] food and some canned foods.  Check food labels (Nutrition Facts labels) on foods to learn how much sodium is in one serving.  Eat more home-cooked food and less restaurant, buffet, and fast food.  When eating at a  restaurant, ask that your food be prepared with less salt or none, if possible.  HOW DO I READ FOOD LABELS FOR SODIUM INFORMATION? The Nutrition Facts label lists the amount of sodium in one serving of the food. If you eat more than one serving, you must multiply the listed amount of sodium by the number of servings. Food labels may also identify foods as:  Sodium free--Less than 5 mg in a serving.  Very low sodium--35 mg or less in a serving.  Low sodium--140 mg or less in a serving.  Light in sodium--50% less sodium in a serving. For example, if a food that usually has 300 mg of sodium is changed to become light in sodium, it will have 150 mg of sodium.  Reduced sodium--25% less sodium in a serving. For example, if a food that usually has 400 mg of sodium is changed to reduced sodium, it will have 300 mg of sodium. WHAT FOODS CAN I EAT? Grains Low-sodium cereals, including oats, puffed wheat and rice, and shredded wheat cereals. Low-sodium crackers. Unsalted rice and pasta. Lower-sodium bread.  Vegetables Frozen or fresh vegetables. Low-sodium or reduced-sodium canned vegetables. Low-sodium or reduced-sodium tomato sauce and paste. Low-sodium or reduced-sodium tomato and vegetable juices.  Fruits Fresh, frozen, and canned fruit. Fruit juice.  Meat and Other Protein Products Low-sodium canned tuna and salmon. Fresh or frozen meat, poultry, seafood, and fish. Lamb. Unsalted nuts. Dried  beans, peas, and lentils without added salt. Unsalted canned beans. Homemade soups without salt. Eggs.  Dairy Milk. Soy milk. Ricotta cheese. Low-sodium or reduced-sodium cheeses. Yogurt.  Condiments Fresh and dried herbs and spices. Salt-free seasonings. Onion and garlic powders. Low-sodium varieties of mustard and ketchup. Lemon juice.  Fats and Oils Reduced-sodium salad dressings. Unsalted butter.  Other Unsalted popcorn and pretzels.  The items listed above may not be a complete list of  recommended foods or beverages. Contact your dietitian for more options. WHAT FOODS ARE NOT RECOMMENDED? Grains Instant hot cereals. Bread stuffing, pancake, and biscuit mixes. Croutons. Seasoned rice or pasta mixes. Noodle soup cups. Boxed or frozen macaroni and cheese. Self-rising flour. Regular salted crackers. Vegetables Regular canned vegetables. Regular canned tomato sauce and paste. Regular tomato and vegetable juices. Frozen vegetables in sauces. Salted french fries. Olives. Angie Fava. Relishes. Sauerkraut. Salsa. Meat and Other Protein Products Salted, canned, smoked, spiced, or pickled meats, seafood, or fish. Bacon, ham, sausage, hot dogs, corned beef, chipped beef, and packaged luncheon meats. Salt pork. Jerky. Pickled herring. Anchovies, regular canned tuna, and sardines. Salted nuts. Dairy Processed cheese and cheese spreads. Cheese curds. Blue cheese and cottage cheese. Buttermilk.  Condiments Onion and garlic salt, seasoned salt, table salt, and sea salt. Canned and packaged gravies. Worcestershire sauce. Tartar sauce. Barbecue sauce. Teriyaki sauce. Soy sauce, including reduced sodium. Steak sauce. Fish sauce. Oyster sauce. Cocktail sauce. Horseradish. Regular ketchup and mustard. Meat flavorings and tenderizers. Bouillon cubes. Hot sauce. Tabasco sauce. Marinades. Taco seasonings. Relishes. Fats and Oils Regular salad dressings. Salted butter. Margarine. Ghee. Bacon fat.  Other Potato and tortilla chips. Corn chips and puffs. Salted popcorn and pretzels. Canned or dried soups. Pizza. Frozen entrees and pot pies.  The items listed above may not be a complete list of foods and beverages to avoid. Contact your dietitian for more information. Document Released: 03/22/2002 Document Revised: 10/05/2013 Document Reviewed: 08/04/2013 [facility] Patient Information 2015 [place], [place]. This information is not intended to replace advice given to you by your health care provider. Make  sure you discuss any questions you have with your health care provider.

## 2014-12-05 NOTE — Assessment & Plan Note (Signed)
Blood pressure stable ? ?

## 2014-12-05 NOTE — Assessment & Plan Note (Signed)
Stable without chest pain. Recent stress Myoview in January 2016 negative for ischemia.

## 2014-12-05 NOTE — Assessment & Plan Note (Signed)
Patient continues to have dyspnea on exertion. I suspect a lot of this is from his COPD. Heart failure is compensated today. Continue Lasix.

## 2014-12-08 ENCOUNTER — Telehealth: Payer: Self-pay | Admitting: *Deleted

## 2014-12-08 NOTE — Telephone Encounter (Signed)
-----   Message from Imogene Burn, PA-C sent at 12/07/2014  9:02 AM EST ----- Labs stable

## 2014-12-09 DIAGNOSIS — G4733 Obstructive sleep apnea (adult) (pediatric): Secondary | ICD-10-CM | POA: Diagnosis not present

## 2014-12-20 DIAGNOSIS — J449 Chronic obstructive pulmonary disease, unspecified: Secondary | ICD-10-CM | POA: Diagnosis not present

## 2014-12-22 DIAGNOSIS — I5032 Chronic diastolic (congestive) heart failure: Secondary | ICD-10-CM | POA: Diagnosis not present

## 2014-12-23 ENCOUNTER — Ambulatory Visit: Payer: Commercial Managed Care - HMO | Admitting: Cardiology

## 2014-12-23 ENCOUNTER — Encounter: Payer: Medicare HMO | Admitting: Cardiology

## 2014-12-30 ENCOUNTER — Other Ambulatory Visit: Payer: Self-pay | Admitting: Gastroenterology

## 2015-01-05 NOTE — Progress Notes (Signed)
See prior office note

## 2015-01-13 DIAGNOSIS — E039 Hypothyroidism, unspecified: Secondary | ICD-10-CM | POA: Diagnosis not present

## 2015-01-20 DIAGNOSIS — J449 Chronic obstructive pulmonary disease, unspecified: Secondary | ICD-10-CM | POA: Diagnosis not present

## 2015-01-22 DIAGNOSIS — I5032 Chronic diastolic (congestive) heart failure: Secondary | ICD-10-CM | POA: Diagnosis not present

## 2015-01-23 ENCOUNTER — Ambulatory Visit (HOSPITAL_COMMUNITY)
Admission: RE | Admit: 2015-01-23 | Payer: Commercial Managed Care - HMO | Source: Ambulatory Visit | Admitting: Gastroenterology

## 2015-01-23 ENCOUNTER — Encounter (HOSPITAL_COMMUNITY): Admission: RE | Payer: Self-pay | Source: Ambulatory Visit

## 2015-01-23 SURGERY — MANOMETRY, ESOPHAGUS

## 2015-01-23 NOTE — H&P (Signed)
  Procedure: High-resolution esophageal manometry to evaluate noncardiac chest pain and chronic esophageal dysphagia. 08/13/2012 normal esophagogastroduodenoscopy. 12/18/2011 barium upper GI x-ray series showed possible stricture at the esophagogastric junction. 12/26/2011 esophagogastroduodenoscopy showed no esophageal stricture. 08/14/2014 normal colonoscopy post diverticular bleed. 10/26/2009 barium esophagram showed slow transit of the barium through the distal esophagus and esophagogastric junction without stricture. 10/20/2008 normal colonoscopy with random colon biopsies. 08/28/2006 colonoscopy with removal of a 3 mm hepatic flexure polyp (no pathology recovered).  History: The patient is an 81 year old male born 1930-01-14. He has chronic intermittent esophageal dysphagia to liquids and solids compatible with evolving achalasia. He is scheduled to undergo high-resolution esophageal manometry.  Past medical history: Type 2 diabetes mellitus. Hypertension. Hypercholesterolemia. Coronary artery disease. Hypothyroidism. Chronic obstructive pulmonary disease. Generalized anxiety disorder. Osteoarthritis. Diastolic heart failure. Ventral hernia repair. Cardiac stents. Left inguinal hernia surgery.  Medication allergies: Plavix  Plan: Proceed with high resolution esophageal manometry to rule out achalasia

## 2015-02-07 ENCOUNTER — Ambulatory Visit: Payer: Commercial Managed Care - HMO | Admitting: Cardiology

## 2015-02-13 ENCOUNTER — Encounter: Payer: Self-pay | Admitting: Cardiology

## 2015-02-13 ENCOUNTER — Ambulatory Visit (INDEPENDENT_AMBULATORY_CARE_PROVIDER_SITE_OTHER): Payer: Commercial Managed Care - HMO | Admitting: Cardiology

## 2015-02-13 VITALS — BP 130/70 | HR 75 | Ht 63.0 in | Wt 208.2 lb

## 2015-02-13 DIAGNOSIS — I5032 Chronic diastolic (congestive) heart failure: Secondary | ICD-10-CM | POA: Diagnosis not present

## 2015-02-13 DIAGNOSIS — I2510 Atherosclerotic heart disease of native coronary artery without angina pectoris: Secondary | ICD-10-CM | POA: Diagnosis not present

## 2015-02-13 DIAGNOSIS — R0600 Dyspnea, unspecified: Secondary | ICD-10-CM

## 2015-02-13 DIAGNOSIS — I10 Essential (primary) hypertension: Secondary | ICD-10-CM

## 2015-02-13 DIAGNOSIS — R609 Edema, unspecified: Secondary | ICD-10-CM

## 2015-02-13 DIAGNOSIS — G4733 Obstructive sleep apnea (adult) (pediatric): Secondary | ICD-10-CM | POA: Diagnosis not present

## 2015-02-13 DIAGNOSIS — R600 Localized edema: Secondary | ICD-10-CM

## 2015-02-13 DIAGNOSIS — E785 Hyperlipidemia, unspecified: Secondary | ICD-10-CM

## 2015-02-13 NOTE — Progress Notes (Signed)
Cardiology Office Note   Date:  02/13/2015   ID:  [name], DOB [birth year], MRN [id number]  PCP:  [name], MD    Chief Complaint  Patient presents with  . Coronary Artery Disease  . Follow-up    hypertension  . Follow-up    hyperlipidemia  . Follow-up    sleep Apnea      History of Present Illness: [name] is a 81 y.o. male with a history of ASCAD, HTN, dyslipidemia, OSA on CPAP, chronic diastolic CHF and obesity who presents today for followup. He is doing well. He denies any dizziness, palpitations or syncope. He has chronic SOB. When I saw him last he had been having more CP that he thought is from indigestion. The pain would come and goe and was sharp. It still resolved with belching. He has some chronic LLE edema which is the leg he had the DVT in the past. A nuclear stress test showed no ischemia with a fixed defect in the inferoapical myocardium with ER 46%.   He tolerates the CPAP and mask well. He feels the pressure is adequate. He feels rested in the am and has no daytime sleepiness. I  saw him on 11/16/14 at which time he was complaining of worsening dyspnea on exertion and lower extremity edema. D-dimer was elevated and lower extremity Dopplers were performed and were negative for DVT he was admitted to the hospital overnight where CT scan was negative for PE. He was diuresed with IV Lasix and sent home on 40 mg daily.  He was seen by Dr. Wynetta Emery and was supposed to get set up for an esophageal manometry but patient declined.  He says that he has not had any chest pain since I saw him last.  He has chronic SOB which is stable.  He has chronic LE edema that has gotten worse.  He has cut out all added salt in his diet.    Patient is here today and doing well. He continues to have dyspnea on exertion with very little activity. He has mild ankle edema. He is trying to cut back on his salt.  Past Medical History  Diagnosis Date  . Diabetes mellitus   .  Asthma   . Hypertension   . Hypercholesterolemia   . Hypothyroidism   . Anxiety   . COPD (chronic obstructive pulmonary disease)   . GERD (gastroesophageal reflux disease)   . Sleep apnea     severe OSA awaiting CPAP titration  . Peripheral vascular disease 11/2006    s/p left stent  . Shortness of breath   . DVT (deep venous thrombosis)   . Diverticulitis Oct. 2013    bleeding in the past and Effient for his CAD was stopped  . Coronary artery disease     s/p PCI of RCA 03/2009 at which time there was 70% ramus and 90% RCA, cath 01/2011 showed patent stents in RCA with moderate prox disesae, aneurysmal left circ and small 90% ramus s/p PCI, patent LAD EF 55%  . Chronic diastolic CHF (congestive heart failure)     diastolic   . OSA (obstructive sleep apnea)     severe on CPAP  . Obesity (BMI 30-39.9)     Past Surgical History  Procedure Laterality Date  . Coronary stent placement  2010 and 2012  . Hernia repair  1992    evntral hernia repair  . Esophagogastroduodenoscopy  12/26/2011    Procedure: ESOPHAGOGASTRODUODENOSCOPY (EGD);  Surgeon: Hassell Done  Sandria Senter, MD;  Location: [facility];  Service: Endoscopy;  Laterality: N/A;  . Balloon dilation  12/26/2011    Procedure: BALLOON DILATION;  Surgeon: Garlan Fair, MD;  Location: [facility];  Service: Endoscopy;  Laterality: N/A;  . Esophagogastroduodenoscopy  08/13/2012    Procedure: ESOPHAGOGASTRODUODENOSCOPY (EGD);  Surgeon: Arta Silence, MD;  Location: [facility];  Service: Endoscopy;  Laterality: Left;  . Colonoscopy  08/14/2012    Procedure: COLONOSCOPY;  Surgeon: Arta Silence, MD;  Location: [facility];  Service: Endoscopy;  Laterality: N/A;  . Abdominal aortagram N/A 04/20/2012    Procedure: ABDOMINAL AORTAGRAM;  Surgeon: Angelia Mould, MD;  Location: [facility] CATH LAB;  Service: Cardiovascular;  Laterality: N/A;  . Abdominal aortagram N/A 12/29/2012    Procedure: ABDOMINAL Maxcine Ham;  Surgeon: Serafina Mitchell, MD;   Location: [facility] CATH LAB;  Service: Cardiovascular;  Laterality: N/A;  . Lower extremity angiogram Bilateral 12/29/2012    Procedure: LOWER EXTREMITY ANGIOGRAM;  Surgeon: Serafina Mitchell, MD;  Location: [facility] CATH LAB;  Service: Cardiovascular;  Laterality: Bilateral;  bilat lower extrem angio     Current Outpatient Prescriptions  Medication Sig Dispense Refill  . albuterol (PROVENTIL HFA;VENTOLIN HFA) 108 (90 BASE) MCG/ACT inhaler Inhale 2 puffs into the lungs every 6 (six) hours as needed for wheezing or shortness of breath (wheezing).     Marland Kitchen albuterol (PROVENTIL) (2.5 MG/3ML) 0.083% nebulizer solution Take 3 mLs (2.5 mg total) by nebulization every 4 (four) hours as needed for wheezing or shortness of breath. (Patient not taking: Reported on 12/05/2014) 75 mL 12  . aspirin EC 81 MG tablet Take 81 mg by mouth daily.    . budesonide-formoterol (SYMBICORT) 80-4.5 MCG/ACT inhaler Inhale 2 puffs into the lungs 2 (two) times daily as needed (wheezing). FOR WHEEZING    . ezetimibe-simvastatin (VYTORIN) 10-20 MG per tablet Take 0.5 tablets by mouth daily. 30 tablet 6  . furosemide (LASIX) 40 MG tablet Take 1 tablet (40 mg total) by mouth daily. 30 tablet 5  . glimepiride (AMARYL) 2 MG tablet Take 2 mg by mouth daily.      . isosorbide mononitrate (IMDUR) 60 MG 24 hr tablet Take 60 mg by mouth daily.    Marland Kitchen levothyroxine (SYNTHROID, LEVOTHROID) 200 MCG tablet Take 200 mcg by mouth daily before breakfast. Take with 80mcg for a total of 275mg  daily    . levothyroxine (SYNTHROID, LEVOTHROID) 75 MCG tablet Take 75 mcg by mouth daily. Take with 231mcg for a total of 275mg  daily    . loperamide (IMODIUM A-D) 2 MG tablet Take 2 mg by mouth 4 (four) times daily as needed (loose stools). For diarrhea.    . meloxicam (MOBIC) 15 MG tablet Take 15 mg by mouth daily.    . metoprolol succinate (TOPROL-XL) 50 MG 24 hr tablet Take 50 mg by mouth daily.  11  . nitroGLYCERIN (NITROSTAT) 0.4 MG SL tablet Place 0.4 mg under the  tongue every 5 (five) minutes x 3 doses as needed. For chest pain.    . NON FORMULARY Sleeps with CPAP Machine    . pantoprazole (PROTONIX) 40 MG tablet Take 1 tablet (40 mg total) by mouth 2 (two) times daily. 60 tablet 3  . potassium chloride SA (KLOR-CON M20) 20 MEQ tablet Take 1 tablet (20 mEq total) by mouth daily. 30 tablet 5   No current facility-administered medications for this visit.    Allergies:   Clopidogrel bisulfate    Social History:  The patient  reports  that he quit smoking about 51 years ago. His smoking use included Cigarettes and Cigars. He has a 7.5 pack-year smoking history. He has never used smokeless tobacco. He reports that he does not drink alcohol or use illicit drugs.   Family History:  The patient's family history includes Asthma in his sister; Breast cancer in his sister; Diabetes in his brother, mother, and sister; Heart attack in his mother and sister; Heart disease in his sister; Hyperlipidemia in his mother, sister, and sister; Hypertension in his mother, sister, and sister. There is no history of Malignant hyperthermia.    ROS:  Please see the history of present illness.   Otherwise, review of systems are positive for none.   All other systems are reviewed and negative.    PHYSICAL EXAM: VS:  Ht 5\' 3"  (1.6 m)  Wt 208 lb 3.2 oz (94.439 kg)  BMI 36.89 kg/m2 , BMI Body mass index is 36.89 kg/(m^2). GEN: Well nourished, well developed, in no acute distress HEENT: normal Neck: no JVD, carotid bruits, or masses Cardiac: meRRR; no murmurs, rubs, or gallops,no edema  Respiratory:  clear to auscultation bilaterally, normal work of breathing GI: soft, nontender, nondistended, + BS MS: no deformity or atrophy Skin: warm and dry, no rash Neuro:  Strength and sensation are intact Psych: euthymic mood, full affect   EKG:  EKG is not ordered today.    Recent Labs: 11/16/2014: B Natriuretic Peptide 45.0; Pro B Natriuretic peptide (BNP) 33.0 11/17/2014: ALT 20;  Hemoglobin 13.7; Platelets 157; TSH 9.229* 12/05/2014: BUN 20; Creatinine 1.01; Potassium 4.2; Sodium 142    Lipid Panel    Component Value Date/Time   CHOL 152 11/16/2014 1124   TRIG 99.0 11/16/2014 1124   HDL 42.60 11/16/2014 1124   CHOLHDL 4 11/16/2014 1124   VLDL 19.8 11/16/2014 1124   LDLCALC 90 11/16/2014 1124      Wt Readings from Last 3 Encounters:  02/13/15 208 lb 3.2 oz (94.439 kg)  12/05/14 206 lb (93.441 kg)  11/16/14 212 lb 6.4 oz (96.344 kg)    ASSESSMENT AND PLAN:  1. ASCAD - his CP is atypical and not like his typical angina. It resolves with belching but seems to be getting worse. His recent nuclear stress test showed no ischemia. Continue ASA/nitrates/BB/statin.  He is followed by Dr. Wynetta Emery with GI.   2. HTN - controlled.   Continue metoprolol . 3.   Dyslipidemia - he was having achiness in the muscles in his upper body which has resolved on a lower dose of Vytorin. His last LDL was not at goal.  We discussed tieh PCSK9 drug and he is willing to try it if he qualifies.  Will refer to lipid clinic 4. OSA on CPAP.   I will get a download from his DME  5. Obesity       6.   Chronic diastolic CHF - he has chronic SOB and  LLE edema. I suspect this is multifactorial from obesity, chronic diastolic CHF, and noncompliance with sodium restriction and NSAID use. He has chronic LLE edema at baseline from prior DVT.Continue beta blocker and Lasix.  I have encouraged him to avoid NSAIDs if possible and keep legs elevated when sitting.    7. COPD - followup with PCP   8. H/O of Chronic DVT    Current medicines are reviewed at length with the patient today.  The patient does not have concerns regarding medicines.  The following changes have been made:  no change  Labs/ tests ordered today include: see above assessment and plan No orders of the defined types were placed in this encounter.     Disposition:   FU with me in 6  months   Signed, Sueanne Margarita, MD  02/13/2015 9:58 AM    [facility] [address], [place], [place]  [address] Phone: [phone]; Fax: [phone]

## 2015-02-13 NOTE — Patient Instructions (Addendum)
Medication Instructions:  Your physician recommends that you continue on your current medications as directed. Please refer to the Current Medication list given to you today.   Labwork: None  Testing/Procedures: None  Follow-Up: You have been referred to LIPID CLINIC.  Your physician wants you to follow-up in: 6 months with Dr. Radford Pax. You will receive a reminder letter in the mail two months in advance. If you don't receive a letter, please call our office to schedule the follow-up appointment.   Any Other Special Instructions Will Be Listed Below (If Applicable). You will hear from [facility] soon to get a download for your PAP.

## 2015-02-20 ENCOUNTER — Ambulatory Visit: Payer: Commercial Managed Care - HMO | Admitting: Pharmacist

## 2015-02-21 DIAGNOSIS — I5032 Chronic diastolic (congestive) heart failure: Secondary | ICD-10-CM | POA: Diagnosis not present

## 2015-02-22 ENCOUNTER — Encounter: Payer: Self-pay | Admitting: *Deleted

## 2015-02-22 NOTE — Progress Notes (Signed)
Patient ID: [name], male   DOB: [birth year], 81 y.o.   MRN: [id number] Order was faxed to [facility] for a Compliance/Therapy download report

## 2015-02-26 DIAGNOSIS — J449 Chronic obstructive pulmonary disease, unspecified: Secondary | ICD-10-CM | POA: Diagnosis not present

## 2015-03-09 ENCOUNTER — Encounter: Payer: Self-pay | Admitting: Cardiology

## 2015-03-21 ENCOUNTER — Other Ambulatory Visit: Payer: Self-pay | Admitting: Cardiology

## 2015-03-24 DIAGNOSIS — I5032 Chronic diastolic (congestive) heart failure: Secondary | ICD-10-CM | POA: Diagnosis not present

## 2015-03-31 DIAGNOSIS — J449 Chronic obstructive pulmonary disease, unspecified: Secondary | ICD-10-CM | POA: Diagnosis not present

## 2015-03-31 DIAGNOSIS — Z6837 Body mass index (BMI) 37.0-37.9, adult: Secondary | ICD-10-CM | POA: Diagnosis not present

## 2015-03-31 DIAGNOSIS — E039 Hypothyroidism, unspecified: Secondary | ICD-10-CM | POA: Diagnosis not present

## 2015-03-31 DIAGNOSIS — I5022 Chronic systolic (congestive) heart failure: Secondary | ICD-10-CM | POA: Diagnosis not present

## 2015-03-31 DIAGNOSIS — E782 Mixed hyperlipidemia: Secondary | ICD-10-CM | POA: Diagnosis not present

## 2015-03-31 DIAGNOSIS — I110 Hypertensive heart disease with heart failure: Secondary | ICD-10-CM | POA: Diagnosis not present

## 2015-03-31 DIAGNOSIS — E1152 Type 2 diabetes mellitus with diabetic peripheral angiopathy with gangrene: Secondary | ICD-10-CM | POA: Diagnosis not present

## 2015-04-23 DIAGNOSIS — I5032 Chronic diastolic (congestive) heart failure: Secondary | ICD-10-CM | POA: Diagnosis not present

## 2015-05-18 ENCOUNTER — Encounter: Payer: Self-pay | Admitting: Cardiology

## 2015-05-24 DIAGNOSIS — I5032 Chronic diastolic (congestive) heart failure: Secondary | ICD-10-CM | POA: Diagnosis not present

## 2015-06-09 DIAGNOSIS — G4733 Obstructive sleep apnea (adult) (pediatric): Secondary | ICD-10-CM | POA: Diagnosis not present

## 2015-06-24 DIAGNOSIS — I5032 Chronic diastolic (congestive) heart failure: Secondary | ICD-10-CM | POA: Diagnosis not present

## 2015-06-29 ENCOUNTER — Other Ambulatory Visit: Payer: Self-pay | Admitting: Physician Assistant

## 2015-07-06 ENCOUNTER — Other Ambulatory Visit: Payer: Self-pay | Admitting: Cardiology

## 2015-07-24 DIAGNOSIS — I5032 Chronic diastolic (congestive) heart failure: Secondary | ICD-10-CM | POA: Diagnosis not present

## 2015-08-07 ENCOUNTER — Encounter: Payer: Self-pay | Admitting: Family

## 2015-08-08 ENCOUNTER — Encounter (HOSPITAL_COMMUNITY): Payer: Commercial Managed Care - HMO

## 2015-08-08 ENCOUNTER — Ambulatory Visit: Payer: Commercial Managed Care - HMO | Admitting: Family

## 2015-08-24 DIAGNOSIS — I5032 Chronic diastolic (congestive) heart failure: Secondary | ICD-10-CM | POA: Diagnosis not present

## 2015-09-08 ENCOUNTER — Other Ambulatory Visit: Payer: Self-pay | Admitting: Cardiology

## 2015-09-23 DIAGNOSIS — I5032 Chronic diastolic (congestive) heart failure: Secondary | ICD-10-CM | POA: Diagnosis not present

## 2015-10-02 ENCOUNTER — Other Ambulatory Visit: Payer: Self-pay | Admitting: Cardiology

## 2015-10-03 NOTE — Telephone Encounter (Signed)
New message        *STAT* If patient is at the pharmacy, call can be transferred to refill team.   1. Which medications need to be refilled? (please list name of each medication and dose if known)  klor con  2. Which pharmacy/location (including street and city if local pharmacy) is medication to be sent to? [facility] [address]. Do they need a 30 day or 90 day supply? 30 day Pt is out of medication

## 2015-10-20 ENCOUNTER — Encounter: Payer: Self-pay | Admitting: Family

## 2015-10-24 DIAGNOSIS — I5032 Chronic diastolic (congestive) heart failure: Secondary | ICD-10-CM | POA: Diagnosis not present

## 2015-10-30 ENCOUNTER — Ambulatory Visit (INDEPENDENT_AMBULATORY_CARE_PROVIDER_SITE_OTHER): Payer: Commercial Managed Care - HMO | Admitting: Family

## 2015-10-30 ENCOUNTER — Ambulatory Visit (INDEPENDENT_AMBULATORY_CARE_PROVIDER_SITE_OTHER)
Admission: RE | Admit: 2015-10-30 | Discharge: 2015-10-30 | Disposition: A | Discharge status: Home / Self Care | Payer: Commercial Managed Care - HMO | Source: Ambulatory Visit | Attending: Family | Admitting: Family

## 2015-10-30 ENCOUNTER — Encounter: Payer: Self-pay | Admitting: Family

## 2015-10-30 ENCOUNTER — Ambulatory Visit (HOSPITAL_COMMUNITY)
Admission: RE | Admit: 2015-10-30 | Discharge: 2015-10-30 | Disposition: A | Discharge status: Home / Self Care | Payer: Commercial Managed Care - HMO | Source: Ambulatory Visit | Attending: Family | Admitting: Family

## 2015-10-30 VITALS — BP 133/70 | HR 85 | Ht 63.0 in | Wt 203.0 lb

## 2015-10-30 DIAGNOSIS — I714 Abdominal aortic aneurysm, without rupture, unspecified: Secondary | ICD-10-CM

## 2015-10-30 DIAGNOSIS — I779 Disorder of arteries and arterioles, unspecified: Secondary | ICD-10-CM | POA: Diagnosis not present

## 2015-10-30 DIAGNOSIS — E119 Type 2 diabetes mellitus without complications: Secondary | ICD-10-CM | POA: Diagnosis not present

## 2015-10-30 DIAGNOSIS — R938 Abnormal findings on diagnostic imaging of other specified body structures: Secondary | ICD-10-CM | POA: Diagnosis not present

## 2015-10-30 DIAGNOSIS — E7800 Pure hypercholesterolemia, unspecified: Secondary | ICD-10-CM | POA: Diagnosis not present

## 2015-10-30 DIAGNOSIS — Z48812 Encounter for surgical aftercare following surgery on the circulatory system: Secondary | ICD-10-CM

## 2015-10-30 DIAGNOSIS — I739 Peripheral vascular disease, unspecified: Secondary | ICD-10-CM

## 2015-10-30 DIAGNOSIS — Z95828 Presence of other vascular implants and grafts: Secondary | ICD-10-CM

## 2015-10-30 DIAGNOSIS — I10 Essential (primary) hypertension: Secondary | ICD-10-CM | POA: Diagnosis not present

## 2015-10-30 DIAGNOSIS — I872 Venous insufficiency (chronic) (peripheral): Secondary | ICD-10-CM

## 2015-10-30 NOTE — Progress Notes (Signed)
[facility] HISTORY AND PHYSICAL -PAD  History of Present Illness [name] is a 82 y.o. male patient of Dr. Scot Dock who is s/p left iliac artery stenting in 2008, and aortogram with bilateral runoff on 12/29/13; impressions are as follows: #1 no significant aortic stenosis. Bilateral common and external iliac arteries are patent without significant stenosis.  #2 bilateral hypogastric artery (internal iliac) occlusion which likely explains the patient's symptoms  #3 bilateral popliteal artery occluded  He has pain in his lateral left hip and left groin after walking variable distances, resolves with rest, denies non healing wounds, denies claudication symptoms in right leg. He has been walking less in the last year due to his hip pain. This has recently improved after he received some injections in his left hip and meloxicam.  Pt denies non healing wounds. The patient denies any history of stroke or TIA.  Pt Diabetic: Yes, A1C June, 2015 was 6.6 Pt smoker: former smoker, quit in the 1960's  Pt meds include: Statin :Yes Betablocker: Yes ASA: Yes Other anticoagulants/antiplatelets: no      Past Medical History  Diagnosis Date  . Diabetes mellitus   . Asthma   . Hypertension   . Hypercholesterolemia   . Hypothyroidism   . Anxiety   . COPD (chronic obstructive pulmonary disease) ([place])   . GERD (gastroesophageal reflux disease)   . Sleep apnea     severe OSA awaiting CPAP titration  . Peripheral vascular disease ([place]) 11/2006    s/p left stent  . Shortness of breath   . DVT (deep venous thrombosis) ([facility])   . Diverticulitis Oct. 2013    bleeding in the past and Effient for his CAD was stopped  . Coronary artery disease     s/p PCI of RCA 03/2009 at which time there was 70% ramus and 90% RCA, cath 01/2011 showed patent stents in RCA with moderate prox disesae, aneurysmal left circ and small 90% ramus s/p PCI, patent LAD EF 55%  . Chronic  diastolic CHF (congestive heart failure) ([facility])     diastolic   . OSA (obstructive sleep apnea)     severe on CPAP  . Obesity (BMI 30-39.9)     Social History Social History  Substance Use Topics  . Smoking status: Former Smoker -- 0.50 packs/day for 15 years    Types: Cigarettes, Cigars    Quit date: 10/15/1963  . Smokeless tobacco: Never Used  . Alcohol Use: No    Family History Family History  Problem Relation Age of Onset  . Malignant hyperthermia Neg Hx   . Diabetes Mother   . Hyperlipidemia Mother   . Hypertension Mother   . Heart attack Mother   . Heart disease Mother     before age 28  . Breast cancer Sister   . Diabetes Sister   . Hyperlipidemia Sister   . Hypertension Sister   . Heart attack Sister   . Hyperlipidemia Sister   . Hypertension Sister   . Heart disease Sister     Heart Disease before age 47  . Diabetes Brother   . Asthma Sister   . Heart disease Father     before age 12    Past Surgical History  Procedure Laterality Date  . Coronary stent placement  2010 and 2012  . Hernia repair  1992    evntral hernia repair  . Esophagogastroduodenoscopy  12/26/2011    Procedure: ESOPHAGOGASTRODUODENOSCOPY (EGD);  Surgeon: Garlan Fair, MD;  Location: [facility];  Service: Endoscopy;  Laterality: N/A;  . Balloon dilation  12/26/2011    Procedure: BALLOON DILATION;  Surgeon: Garlan Fair, MD;  Location: [facility];  Service: Endoscopy;  Laterality: N/A;  . Esophagogastroduodenoscopy  08/13/2012    Procedure: ESOPHAGOGASTRODUODENOSCOPY (EGD);  Surgeon: Arta Silence, MD;  Location: [facility];  Service: Endoscopy;  Laterality: Left;  . Colonoscopy  08/14/2012    Procedure: COLONOSCOPY;  Surgeon: Arta Silence, MD;  Location: [facility];  Service: Endoscopy;  Laterality: N/A;  . Abdominal aortagram N/A 04/20/2012    Procedure: ABDOMINAL AORTAGRAM;  Surgeon: Angelia Mould, MD;  Location: [facility] CATH LAB;  Service: Cardiovascular;   Laterality: N/A;  . Abdominal aortagram N/A 12/29/2012    Procedure: ABDOMINAL Maxcine Ham;  Surgeon: Serafina Mitchell, MD;  Location: [facility] CATH LAB;  Service: Cardiovascular;  Laterality: N/A;  . Lower extremity angiogram Bilateral 12/29/2012    Procedure: LOWER EXTREMITY ANGIOGRAM;  Surgeon: Serafina Mitchell, MD;  Location: [facility] CATH LAB;  Service: Cardiovascular;  Laterality: Bilateral;  bilat lower extrem angio    Allergies  Allergen Reactions  . Clopidogrel Bisulfate Itching    Current Outpatient Prescriptions  Medication Sig Dispense Refill  . albuterol (PROVENTIL HFA;VENTOLIN HFA) 108 (90 BASE) MCG/ACT inhaler Inhale 2 puffs into the lungs every 6 (six) hours as needed for wheezing or shortness of breath (wheezing).     Marland Kitchen albuterol (PROVENTIL) (2.5 MG/3ML) 0.083% nebulizer solution Take 3 mLs (2.5 mg total) by nebulization every 4 (four) hours as needed for wheezing or shortness of breath. 75 mL 12  . aspirin EC 81 MG tablet Take 81 mg by mouth daily.    . budesonide-formoterol (SYMBICORT) 80-4.5 MCG/ACT inhaler Inhale 2 puffs into the lungs 2 (two) times daily as needed (wheezing). FOR WHEEZING    . ezetimibe-simvastatin (VYTORIN) 10-20 MG per tablet Take 0.5 tablets by mouth daily. 30 tablet 6  . furosemide (LASIX) 20 MG tablet Take 20 mg by mouth daily.    Marland Kitchen glimepiride (AMARYL) 2 MG tablet Take 2 mg by mouth daily.      . isosorbide mononitrate (IMDUR) 60 MG 24 hr tablet Take 60 mg by mouth daily.    . isosorbide mononitrate (IMDUR) 60 MG 24 hr tablet TAKE 1 TABLET (60 MG TOTAL) BY MOUTH DAILY. 30 tablet 5  . KLOR-CON M20 20 MEQ tablet TAKE 1 TABLET (20 MEQ TOTAL) BY MOUTH DAILY. 30 tablet 1  . latanoprost (XALATAN) 0.005 % ophthalmic solution Place 1 drop into both eyes as needed.    Marland Kitchen levothyroxine (SYNTHROID, LEVOTHROID) 300 MCG tablet Take 300 mcg by mouth daily.  3  . loperamide (IMODIUM A-D) 2 MG tablet Take 2 mg by mouth 4 (four) times daily as needed (loose stools). For diarrhea.     . meloxicam (MOBIC) 15 MG tablet Take 15 mg by mouth daily.    . metoprolol succinate (TOPROL-XL) 50 MG 24 hr tablet Take 50 mg by mouth daily.  11  . nitroGLYCERIN (NITROSTAT) 0.4 MG SL tablet Place 0.4 mg under the tongue every 5 (five) minutes x 3 doses as needed. For chest pain.    . NON FORMULARY Sleeps with CPAP Machine    . pantoprazole (PROTONIX) 40 MG tablet TAKE 1 TABLET (40 MG TOTAL) BY MOUTH 2 (TWO) TIMES DAILY. 60 tablet 6   No current facility-administered medications for this visit.    ROS: See HPI for pertinent positives and negatives.   Physical Examination  Filed Vitals:  10/30/15 1537  BP: 133/70  Pulse: 85  Height: 5\' 3"  (1.6 m)  Weight: 203 lb (92.08 kg)  SpO2: 95%   Body mass index is 35.97 kg/(m^2).  General: A&O x 3, WDWN, obese male. Gait: normal Eyes: PERRLA. Pulmonary: CTAB, without wheezes , rales or rhonchi. Cardiac: regular rythm, no detected murmur.     Carotid Bruits Right Left   Negative Negative  Aorta is not palpable. Radial pulses: are 2+ palpable and =   VASCULAR EXAM: Extremities without ischemic changes  without Gangrene; without open wounds.      LE Pulses Right Left   FEMORAL 1+ palpable 1+ palpable    POPLITEAL not palpable  not palpable   POSTERIOR TIBIAL not palpable  not palpable    DORSALIS PEDIS  ANTERIOR TIBIAL not palpable  not palpable    Abdomen: soft, NT, no palpable masses. Skin: no rashes, no ulcers. Musculoskeletal: no muscle wasting or atrophy.Pitting and non pitting edema in both lower legs: 1+ in right, 2+ in left. Neurologic: A&O X 3; Appropriate Affect ; SENSATION: normal; MOTOR FUNCTION: moving all extremities equally, motor strength 5/5 throughout. Speech is fluent/normal.  CN 2-12 intact.           Non-Invasive Vascular Imaging: DATE: 10/30/2015  Left Aortoiliac Duplex: Patent left CIA stent with monophasic left external iliac artery. Doppler waveforms suggestive of a possible significant CIA level stenosis, based on limited visualization. Known distal abdominal aortic aneurysm with maximum diameter of 3.5 cm x 3.6 cm, based on limited visualization. Unable to adequately compare to the previous exam on 08/03/14 since the stent could not be adequately visualized today. No significant change in the AAA when compared to the previous exam on 05/21/13 when the maximum diameter was 3.29 cm.  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 The pain in his lateral left hip and left groin after walking variable distances resolved with injections into the left hip by ortho, and a course of meloxicam.  He had been walking less due to the hip pain until recently resolved with ortho treatment.   His ABI's results today reflect less walking compared to 08/03/14.  He does not seem to have claudication sx's with walking, has no signs of ischemia in his feet/legs.  Aortogram with bilateral runoff on 12/29/13 demonstrated bilateral hypogastric artery (internal iliac) occlusion; he does not have claudication symptoms. Today's left iliac artery stent evaluation demonstrates a patent left CIA stent with monophasic left external iliac artery. Doppler waveforms suggestive of a possible significant CIA level stenosis, based on limited visualization. Known distal abdominal aortic aneurysm with maximum diameter of 3.5 cm x 3.6 cm, based on limited visualization. Unable to adequately compare to the previous exam on 08/03/14 since the  stent could not be adequately visualized today. No significant change in the AAA when compared to the previous exam on 05/21/13 when the maximum diameter was 3.29 cm.  Chronic venous insufficiency, with no cellulitis, minimal skin changes, no stasis ulcers. He has 1-2+ pitting and non pitting edema in his lower legs and feet that resolves with overnight elevation.  Face to face time with patient was 25 minutes. Over 50% of this time  was spent on counseling and coordination of care.    PLAN:  Graduated walking program.  Elevate feet above the heart whenever he is not walking, to the extent possible. Knee high graduated compression hose, 20-30 mm Hg; pt and wife instructed in measuring for best fit.  Printed information about ETI given to pt.  Based on the patient's vascular studies and examination, pt will return to clinic in 1 year with ABI's and left iliac artery duplex  I discussed in depth with the patient the nature of atherosclerosis, and emphasized the importance of maximal medical management including strict control of blood pressure, blood glucose, and lipid levels, obtaining regular exercise, and continued cessation of smoking.  The patient is aware that without maximal medical management the underlying atherosclerotic disease process will progress, limiting the benefit of any interventions.  The patient was given information about PAD including signs, symptoms, treatment, what symptoms should prompt the patient to seek immediate medical care, and risk reduction measures to take.  Clemon Chambers, RN, MSN, FNP-C [facility] Phone: [phone]  Clinic MD: Trula Slade  10/30/2015 4:20 PM

## 2015-10-30 NOTE — Patient Instructions (Addendum)
Peripheral Vascular Disease  Peripheral vascular disease (PVD) is a disease of the blood vessels that are not part of your heart and brain. A simple term for PVD is poor circulation. In most cases, PVD narrows the blood vessels that carry blood from your heart to the rest of your body. This can result in a decreased supply of blood to your arms, legs, and internal organs, like your stomach or kidneys. However, it most often affects a person's lower legs and feet.  There are two types of PVD.  · Organic PVD. This is the more common type. It is caused by damage to the structure of blood vessels.  · Functional PVD. This is caused by conditions that make blood vessels contract and tighten (spasm).  Without treatment, PVD tends to get worse over time.  PVD can also lead to acute ischemic limb. This is when an arm or limb suddenly has trouble getting enough blood. This is a medical emergency.  CAUSES  Each type of PVD has many different causes. The most common cause of PVD is buildup of a fatty material (plaque) inside of your arteries (atherosclerosis). Small amounts of plaque can break off from the walls of the blood vessels and become lodged in a smaller artery. This blocks blood flow and can cause acute ischemic limb.  Other common causes of PVD include:  · Blood clots that form inside of blood vessels.  · Injuries to blood vessels.  · Diseases that cause inflammation of blood vessels or cause blood vessel spasms.  · Health behaviors and health history that increase your risk of developing PVD.  RISK FACTORS   You may have a greater risk of PVD if you:  · Have a family history of PVD.  · Have certain medical conditions, including:    High cholesterol.    Diabetes.    High blood pressure (hypertension).    Coronary heart disease.    Past problems with blood clots.    Past injury, such as burns or a broken bone. These may have damaged blood vessels in your limbs.    Buerger disease. This is caused by inflamed blood  vessels in your hands and feet.    Some forms of arthritis.    Rare birth defects that affect the arteries in your legs.  · Use tobacco.  · Do not get enough exercise.  · Are obese.  · Are age 50 or older.  SIGNS AND SYMPTOMS   PVD may cause many different symptoms. Your symptoms depend on what part of your body is not getting enough blood. Some common signs and symptoms include:  · Cramps in your lower legs. This may be a symptom of poor leg circulation (claudication).  · Pain and weakness in your legs while you are physically active that goes away when you rest (intermittent claudication).  · Leg pain when at rest.  · Leg numbness, tingling, or weakness.  · Coldness in a leg or foot, especially when compared with the other leg.  · Skin or hair changes. These can include:    Hair loss.    Shiny skin.    Pale or bluish skin.    Thick toenails.  · Inability to get or maintain an erection (erectile dysfunction).  People with PVD are more prone to developing ulcers and sores on their toes, feet, or legs. These may take longer than normal to heal.  DIAGNOSIS  Your health care provider may diagnose PVD from your signs and symptoms.   The health care provider will also do a physical exam. You may have tests to find out what is causing your PVD and determine its severity. Tests may include:  · Blood pressure recordings from your arms and legs and measurements of the strength of your pulses (pulse volume recordings).  · Imaging studies using sound waves to take pictures of the blood flow through your blood vessels (Doppler ultrasound).  · Injecting a dye into your blood vessels before having imaging studies using:    X-rays (angiogram or arteriogram).    Computer-generated X-rays (CT angiogram).    A powerful electromagnetic field and a computer (magnetic resonance angiogram or MRA).  TREATMENT  Treatment for PVD depends on the cause of your condition and the severity of your symptoms. It also depends on your age. Underlying  causes need to be treated and controlled. These include long-lasting (chronic) conditions, such as diabetes, high cholesterol, and high blood pressure. You may need to first try making lifestyle changes and taking medicines. Surgery may be needed if these do not work.  Lifestyle changes may include:  · Quitting smoking.  · Exercising regularly.  · Following a low-fat, low-cholesterol diet.  Medicines may include:  · Blood thinners to prevent blood clots.  · Medicines to improve blood flow.  · Medicines to improve your blood cholesterol levels.  Surgical procedures may include:  · A procedure that uses an inflated balloon to open a blocked artery and improve blood flow (angioplasty).  · A procedure to put in a tube (stent) to keep a blocked artery open (stent implant).  · Surgery to reroute blood flow around a blocked artery (peripheral bypass surgery).  · Surgery to remove dead tissue from an infected wound on the affected limb.  · Amputation. This is surgical removal of the affected limb. This may be necessary in cases of acute ischemic limb that are not improved through medical or surgical treatments.  HOME CARE INSTRUCTIONS  · Take medicines only as directed by your health care provider.  · Do not use any tobacco products, including cigarettes, chewing tobacco, or electronic cigarettes.  If you need help quitting, ask your health care provider.  · Lose weight if you are overweight, and maintain a healthy weight as directed by your health care provider.  · Eat a diet that is low in fat and cholesterol. If you need help, ask your health care provider.  · Exercise regularly. Ask your health care provider to suggest some good activities for you.  · Use compression stockings or other mechanical devices as directed by your health care provider.  · Take good care of your feet.    Wear comfortable shoes that fit well.    Check your feet often for any cuts or sores.  SEEK MEDICAL CARE IF:  · You have cramps in your legs  while walking.  · You have leg pain when you are at rest.  · You have coldness in a leg or foot.  · Your skin changes.  · You have erectile dysfunction.  · You have cuts or sores on your feet that are not healing.  SEEK IMMEDIATE MEDICAL CARE IF:  · Your arm or leg turns cold and blue.  · Your arms or legs become red, warm, swollen, painful, or numb.  · You have chest pain or trouble breathing.  · You suddenly have weakness in your face, arm, or leg.  · You become very confused or lose the ability to speak.  ·   You suddenly have a very bad headache or lose your vision.     This information is not intended to replace advice given to you by your health care provider. Make sure you discuss any questions you have with your health care provider.     Document Released: 11/07/2004 Document Revised: 10/21/2014 Document Reviewed: 03/10/2014  Elsevier Interactive Patient Education ©2016 [employer]    Venous Stasis or Chronic Venous Insufficiency  Chronic venous insufficiency, also called venous stasis, is a condition that affects the veins in the legs. The condition prevents blood from being pumped through these veins effectively. Blood may no longer be pumped effectively from the legs back to the heart. This condition can range from mild to severe. With proper treatment, you should be able to continue with an active life.  CAUSES   Chronic venous insufficiency occurs when the vein walls become stretched, weakened, or damaged or when valves within the vein are damaged. Some common causes of this include:  · High blood pressure inside the veins (venous hypertension).  · Increased blood pressure in the leg veins from long periods of sitting or standing.  · A blood clot that blocks blood flow in a vein (deep vein thrombosis).  · Inflammation of a superficial vein (phlebitis) that causes a blood clot to form.  RISK FACTORS  Various things can make you more likely to develop chronic venous insufficiency, including:  · Family  history of this condition.  · Obesity.  · Pregnancy.  · Sedentary lifestyle.  · Smoking.  · Jobs requiring long periods of standing or sitting in one place.  · Being a certain age. Women in their 40s and 50s and men in their 70s are more likely to develop this condition.  SIGNS AND SYMPTOMS   Symptoms may include:   · Varicose veins.  · Skin breakdown or ulcers.  · Reddened or discolored skin on the leg.  · Brown, smooth, tight, and painful skin just above the ankle, usually on the inside surface (lipodermatosclerosis).  · Swelling.  DIAGNOSIS   To diagnose this condition, your health care provider will take a medical history and do a physical exam. The following tests may be ordered to confirm the diagnosis:  · Duplex ultrasound--A procedure that produces a picture of a blood vessel and nearby organs and also provides information on blood flow through the blood vessel.  · Plethysmography--A procedure that tests blood flow.  · A venogram, or venography--A procedure used to look at the veins using X-ray and dye.  TREATMENT  The goals of treatment are to help you return to an active life and to minimize pain or disability. Treatment will depend on the severity of the condition. Medical procedures may be needed for severe cases. Treatment options may include:   · Use of compression stockings. These can help with symptoms and lower the chances of the problem getting worse, but they do not cure the problem.  · Sclerotherapy--A procedure involving an injection of a material that "dissolves" the damaged veins. Other veins in the network of blood vessels take over the function of the damaged veins.  · Surgery to remove the vein or cut off blood flow through the vein (vein stripping or laser ablation surgery).  · Surgery to repair a valve.  HOME CARE INSTRUCTIONS   · Wear compression stockings as directed by your health care provider.  · Only take over-the-counter or prescription medicines for pain, discomfort, or fever as  directed by your health   care provider.  · Follow up with your health care provider as directed.  SEEK MEDICAL CARE IF:   · You have redness, swelling, or increasing pain in the affected area.  · You see a red streak or line that extends up or down from the affected area.  · You have a breakdown or loss of skin in the affected area, even if the breakdown is small.  · You have an injury to the affected area.  SEEK IMMEDIATE MEDICAL CARE IF:   · You have an injury and open wound in the affected area.  · Your pain is severe and does not improve with medicine.  · You have sudden numbness or weakness in the foot or ankle below the affected area, or you have trouble moving your foot or ankle.  · You have a fever or persistent symptoms for more than 2-3 days.  · You have a fever and your symptoms suddenly get worse.  MAKE SURE YOU:   · Understand these instructions.  · Will watch your condition.  · Will get help right away if you are not doing well or get worse.     This information is not intended to replace advice given to you by your health care provider. Make sure you discuss any questions you have with your health care provider.     Document Released: 02/03/2007 Document Revised: 07/21/2013 Document Reviewed: 06/07/2013  Elsevier Interactive Patient Education ©2016 [employer]

## 2015-10-31 NOTE — Addendum Note (Signed)
Addended by: Dorthula Rue L on: 10/31/2015 10:19 AM   Modules accepted: Orders

## 2015-11-06 DIAGNOSIS — M79603 Pain in arm, unspecified: Secondary | ICD-10-CM | POA: Diagnosis not present

## 2015-11-06 DIAGNOSIS — E782 Mixed hyperlipidemia: Secondary | ICD-10-CM | POA: Diagnosis not present

## 2015-11-06 DIAGNOSIS — J441 Chronic obstructive pulmonary disease with (acute) exacerbation: Secondary | ICD-10-CM | POA: Diagnosis not present

## 2015-11-24 DIAGNOSIS — I5032 Chronic diastolic (congestive) heart failure: Secondary | ICD-10-CM | POA: Diagnosis not present

## 2015-12-09 ENCOUNTER — Other Ambulatory Visit: Payer: Self-pay | Admitting: Cardiology

## 2015-12-18 DIAGNOSIS — E782 Mixed hyperlipidemia: Secondary | ICD-10-CM | POA: Diagnosis not present

## 2015-12-18 DIAGNOSIS — J449 Chronic obstructive pulmonary disease, unspecified: Secondary | ICD-10-CM | POA: Diagnosis not present

## 2015-12-18 DIAGNOSIS — I2510 Atherosclerotic heart disease of native coronary artery without angina pectoris: Secondary | ICD-10-CM | POA: Diagnosis not present

## 2015-12-18 DIAGNOSIS — G4733 Obstructive sleep apnea (adult) (pediatric): Secondary | ICD-10-CM | POA: Diagnosis not present

## 2015-12-18 DIAGNOSIS — E039 Hypothyroidism, unspecified: Secondary | ICD-10-CM | POA: Diagnosis not present

## 2015-12-18 DIAGNOSIS — E1159 Type 2 diabetes mellitus with other circulatory complications: Secondary | ICD-10-CM | POA: Diagnosis not present

## 2015-12-18 DIAGNOSIS — I110 Hypertensive heart disease with heart failure: Secondary | ICD-10-CM | POA: Diagnosis not present

## 2015-12-18 DIAGNOSIS — I739 Peripheral vascular disease, unspecified: Secondary | ICD-10-CM | POA: Diagnosis not present

## 2015-12-22 DIAGNOSIS — I5032 Chronic diastolic (congestive) heart failure: Secondary | ICD-10-CM | POA: Diagnosis not present

## 2016-01-03 DIAGNOSIS — H52203 Unspecified astigmatism, bilateral: Secondary | ICD-10-CM | POA: Diagnosis not present

## 2016-01-03 DIAGNOSIS — E119 Type 2 diabetes mellitus without complications: Secondary | ICD-10-CM | POA: Diagnosis not present

## 2016-01-03 DIAGNOSIS — H401124 Primary open-angle glaucoma, left eye, indeterminate stage: Secondary | ICD-10-CM | POA: Diagnosis not present

## 2016-01-03 DIAGNOSIS — H401114 Primary open-angle glaucoma, right eye, indeterminate stage: Secondary | ICD-10-CM | POA: Diagnosis not present

## 2016-01-08 ENCOUNTER — Other Ambulatory Visit: Payer: Self-pay | Admitting: Cardiology

## 2016-01-09 ENCOUNTER — Other Ambulatory Visit: Payer: Self-pay | Admitting: Cardiology

## 2016-01-16 ENCOUNTER — Other Ambulatory Visit: Payer: Self-pay | Admitting: Cardiology

## 2016-01-22 DIAGNOSIS — I5032 Chronic diastolic (congestive) heart failure: Secondary | ICD-10-CM | POA: Diagnosis not present

## 2016-01-30 DIAGNOSIS — E039 Hypothyroidism, unspecified: Secondary | ICD-10-CM | POA: Diagnosis not present

## 2016-02-12 ENCOUNTER — Other Ambulatory Visit: Payer: Self-pay | Admitting: Cardiology

## 2016-02-14 ENCOUNTER — Ambulatory Visit: Payer: Commercial Managed Care - HMO | Admitting: Cardiology

## 2016-02-21 DIAGNOSIS — I5032 Chronic diastolic (congestive) heart failure: Secondary | ICD-10-CM | POA: Diagnosis not present

## 2016-03-08 ENCOUNTER — Encounter: Payer: Self-pay | Admitting: Cardiology

## 2016-03-08 ENCOUNTER — Ambulatory Visit (INDEPENDENT_AMBULATORY_CARE_PROVIDER_SITE_OTHER): Payer: Commercial Managed Care - HMO | Admitting: Cardiology

## 2016-03-08 VITALS — BP 142/68 | HR 101 | Ht 64.0 in | Wt 204.0 lb

## 2016-03-08 DIAGNOSIS — I10 Essential (primary) hypertension: Secondary | ICD-10-CM | POA: Diagnosis not present

## 2016-03-08 DIAGNOSIS — I2510 Atherosclerotic heart disease of native coronary artery without angina pectoris: Secondary | ICD-10-CM | POA: Diagnosis not present

## 2016-03-08 DIAGNOSIS — R609 Edema, unspecified: Secondary | ICD-10-CM

## 2016-03-08 DIAGNOSIS — G4733 Obstructive sleep apnea (adult) (pediatric): Secondary | ICD-10-CM

## 2016-03-08 DIAGNOSIS — E669 Obesity, unspecified: Secondary | ICD-10-CM

## 2016-03-08 DIAGNOSIS — I2583 Coronary atherosclerosis due to lipid rich plaque: Principal | ICD-10-CM

## 2016-03-08 DIAGNOSIS — E785 Hyperlipidemia, unspecified: Secondary | ICD-10-CM

## 2016-03-08 DIAGNOSIS — I5032 Chronic diastolic (congestive) heart failure: Secondary | ICD-10-CM | POA: Diagnosis not present

## 2016-03-08 DIAGNOSIS — R600 Localized edema: Secondary | ICD-10-CM

## 2016-03-08 NOTE — Progress Notes (Signed)
Cardiology Office Note    Date:  03/08/2016   ID:  [name], DOB 06-01-1930, MRN [id number]  PCP:  [name], MD  Cardiologist:  Fransico Him, MD   Chief Complaint  Patient presents with  . Coronary Artery Disease  . Hypertension  . Sleep Apnea  . Hyperlipidemia    History of Present Illness:  [name] is a 82 y.o. male with a history of ASCAD, HTN, dyslipidemia, OSA on CPAP, chronic diastolic CHF and obesity who presents today for followup. He is doing well. He denies any dizziness, palpitations or syncope. He has chronic SOB which is stable with no change.  He has some chronic LLE edema which is the leg he had the DVT in the past.  He tolerates the CPAP and full face mask well. He feels the pressure is adequate. He feels rested in the am. He has had some problems going to sleep at night because he is not sleepy. He naps some during the day.   Past Medical History  Diagnosis Date  . Diabetes mellitus   . Asthma   . Hypertension   . Hypercholesterolemia   . Hypothyroidism   . Anxiety   . COPD (chronic obstructive pulmonary disease) ([place])   . GERD (gastroesophageal reflux disease)   . Sleep apnea     severe OSA awaiting CPAP titration  . Peripheral vascular disease ([place]) 11/2006    s/p left stent  . Shortness of breath   . DVT (deep venous thrombosis) ([place])   . Diverticulitis Oct. 2013    bleeding in the past and Effient for his CAD was stopped  . Coronary artery disease     s/p PCI of RCA 03/2009 at which time there was 70% ramus and 90% RCA, cath 01/2011 showed patent stents in RCA with moderate prox disesae, aneurysmal left circ and small 90% ramus s/p PCI, patent LAD EF 55%  . Chronic diastolic CHF (congestive heart failure) ([facility])     diastolic   . OSA (obstructive sleep apnea)     severe on CPAP  . Obesity (BMI 30-39.9)     Past Surgical History  Procedure Laterality Date  . Coronary stent placement  2010 and 2012  . Hernia repair  1992   evntral hernia repair  . Esophagogastroduodenoscopy  12/26/2011    Procedure: ESOPHAGOGASTRODUODENOSCOPY (EGD);  Surgeon: Garlan Fair, MD;  Location: [facility];  Service: Endoscopy;  Laterality: N/A;  . Balloon dilation  12/26/2011    Procedure: BALLOON DILATION;  Surgeon: Garlan Fair, MD;  Location: [facility] ENDOSCOPY;  Service: Endoscopy;  Laterality: N/A;  . Esophagogastroduodenoscopy  08/13/2012    Procedure: ESOPHAGOGASTRODUODENOSCOPY (EGD);  Surgeon: Arta Silence, MD;  Location: [facility];  Service: Endoscopy;  Laterality: Left;  . Colonoscopy  08/14/2012    Procedure: COLONOSCOPY;  Surgeon: Arta Silence, MD;  Location: [facility] ENDOSCOPY;  Service: Endoscopy;  Laterality: N/A;  . Abdominal aortagram N/A 04/20/2012    Procedure: ABDOMINAL AORTAGRAM;  Surgeon: Angelia Mould, MD;  Location: [facility] CATH LAB;  Service: Cardiovascular;  Laterality: N/A;  . Abdominal aortagram N/A 12/29/2012    Procedure: ABDOMINAL Maxcine Ham;  Surgeon: Serafina Mitchell, MD;  Location: [facility] CATH LAB;  Service: Cardiovascular;  Laterality: N/A;  . Lower extremity angiogram Bilateral 12/29/2012    Procedure: LOWER EXTREMITY ANGIOGRAM;  Surgeon: Serafina Mitchell, MD;  Location: [facility] CATH LAB;  Service: Cardiovascular;  Laterality: Bilateral;  bilat lower extrem angio    Current Medications: Outpatient Prescriptions  Prior to Visit  Medication Sig Dispense Refill  . albuterol (PROVENTIL HFA;VENTOLIN HFA) 108 (90 BASE) MCG/ACT inhaler Inhale 2 puffs into the lungs every 6 (six) hours as needed for wheezing or shortness of breath (wheezing).     Marland Kitchen albuterol (PROVENTIL) (2.5 MG/3ML) 0.083% nebulizer solution Take 3 mLs (2.5 mg total) by nebulization every 4 (four) hours as needed for wheezing or shortness of breath. 75 mL 12  . aspirin EC 81 MG tablet Take 81 mg by mouth daily.    . budesonide-formoterol (SYMBICORT) 80-4.5 MCG/ACT inhaler Inhale 2 puffs into the lungs 2 (two) times daily as needed (wheezing). FOR  WHEEZING    . ezetimibe-simvastatin (VYTORIN) 10-20 MG per tablet Take 0.5 tablets by mouth daily. 30 tablet 6  . furosemide (LASIX) 20 MG tablet Take 20 mg by mouth daily.    Marland Kitchen glimepiride (AMARYL) 2 MG tablet Take 2 mg by mouth daily.      . isosorbide mononitrate (IMDUR) 60 MG 24 hr tablet Take 1 tablet (60 mg total) by mouth daily. 30 tablet 0  . KLOR-CON M20 20 MEQ tablet TAKE 1 TABLET (20 MEQ TOTAL) BY MOUTH DAILY. 30 tablet 1  . KLOR-CON M20 20 MEQ tablet TAKE 1 TABLET (20 MEQ TOTAL) BY MOUTH DAILY. 30 tablet 2  . latanoprost (XALATAN) 0.005 % ophthalmic solution Place 1 drop into both eyes as needed.    Marland Kitchen levothyroxine (SYNTHROID, LEVOTHROID) 300 MCG tablet Take 300 mcg by mouth daily.  3  . loperamide (IMODIUM A-D) 2 MG tablet Take 2 mg by mouth 4 (four) times daily as needed (loose stools). For diarrhea.    . meloxicam (MOBIC) 15 MG tablet Take 15 mg by mouth daily.    . metoprolol succinate (TOPROL-XL) 50 MG 24 hr tablet TAKE 1 TABLET BY MOUTH EVERY DAY 30 tablet 11  . nitroGLYCERIN (NITROSTAT) 0.4 MG SL tablet Place 0.4 mg under the tongue every 5 (five) minutes x 3 doses as needed. For chest pain.    . NON FORMULARY Sleeps with CPAP Machine    . pantoprazole (PROTONIX) 40 MG tablet TAKE 1 TABLET (40 MG TOTAL) BY MOUTH 2 (TWO) TIMES DAILY. 60 tablet 6   No facility-administered medications prior to visit.     Allergies:   Clopidogrel bisulfate   Social History   Social History  . Marital Status: Widowed    Spouse Name: N/A  . Number of Children: N/A  . Years of Education: N/A   Social History Main Topics  . Smoking status: Former Smoker -- 0.50 packs/day for 15 years    Types: Cigarettes, Cigars    Quit date: 10/15/1963  . Smokeless tobacco: Never Used  . Alcohol Use: No  . Drug Use: No  . Sexual Activity: No   Other Topics Concern  . None   Social History Narrative     Family History:  The patient's family history includes Asthma in his sister; Breast cancer  in his sister; Diabetes in his brother, mother, and sister; Heart attack in his mother and sister; Heart disease in his father, mother, and sister; Hyperlipidemia in his mother, sister, and sister; Hypertension in his mother, sister, and sister. There is no history of Malignant hyperthermia.   ROS:   Please see the history of present illness.    ROS All other systems reviewed and are negative.   PHYSICAL EXAM:   VS:  BP 142/68 mmHg  Pulse 101  Ht 5\' 4"  (1.626 m)  Wt 204 lb (  92.534 kg)  BMI 35.00 kg/m2   GEN: Well nourished, well developed, in no acute distress HEENT: normal Neck: no JVD, carotid bruits, or masses Cardiac: RRR; no murmurs, rubs, or gallops,no edema.  Intact distal pulses bilaterally.  Respiratory:  clear to auscultation bilaterally, normal work of breathing GI: soft, nontender, nondistended, + BS MS: no deformity or atrophy Skin: warm and dry, no rash Neuro:  Alert and Oriented x 3, Strength and sensation are intact Psych: euthymic mood, full affect  Wt Readings from Last 3 Encounters:  03/08/16 204 lb (92.534 kg)  10/30/15 203 lb (92.08 kg)  02/13/15 208 lb 3.2 oz (94.439 kg)      Studies/Labs Reviewed:   EKG:  EKG is  ordered today and showed sinus tachycardia at 101bpm with no ST changes  Recent Labs: No results found for requested labs within last 365 days.   Lipid Panel    Component Value Date/Time   CHOL 152 11/16/2014 1124   TRIG 99.0 11/16/2014 1124   HDL 42.60 11/16/2014 1124   CHOLHDL 4 11/16/2014 1124   VLDL 19.8 11/16/2014 1124   LDLCALC 90 11/16/2014 1124    Additional studies/ records that were reviewed today include:  none    ASSESSMENT:    1. Coronary artery disease due to lipid rich plaque   2. HYPERTENSION, BENIGN   3. Chronic diastolic CHF (congestive heart failure) ([place])   4. OSA (obstructive sleep apnea)   5. Dyslipidemia   6. Obesity (BMI 30-39.9)   7. Edema extremities      PLAN:  In order of problems listed  above:  1. ASCAD with no angina. Continue ASA/long acting nitrate/BB 2. HTN - BP controlled on current medical regimen.  Continue BB. 3. Chronic diastolic CHF - he appears euvolemic on exam.  Continue BB and diuretic.  Check BMET.  OSA - the patient is tolerating PAP therapy well without any problems.   The patient has been using and benefiting from CPAP use and will continue to benefit from therapy. I will get a d/l from the DME 4. Dyslipidemia - continue statin.  LDL goal is < 70. Check FLP and ALT.  5. Obesity -I have encouraged him to get into a routine exercise program and cut back on carbs and portions.  6. LE edema - controlled on diuretic therapy.  Continue Lasix.      Medication Adjustments/Labs and Tests Ordered: Current medicines are reviewed at length with the patient today.  Concerns regarding medicines are outlined above.  Medication changes, Labs and Tests ordered today are listed in the Patient Instructions below.  There are no Patient Instructions on file for this visit.   Signed, Fransico Him, MD  03/08/2016 2:51 PM    [facility] [address], [place],   [address] Phone: [phone]; Fax: [phone]

## 2016-03-08 NOTE — Patient Instructions (Signed)

## 2016-03-13 ENCOUNTER — Other Ambulatory Visit: Payer: Self-pay | Admitting: Cardiology

## 2016-03-14 ENCOUNTER — Other Ambulatory Visit: Payer: Self-pay | Admitting: *Deleted

## 2016-03-14 DIAGNOSIS — G4733 Obstructive sleep apnea (adult) (pediatric): Secondary | ICD-10-CM

## 2016-03-14 DIAGNOSIS — E039 Hypothyroidism, unspecified: Secondary | ICD-10-CM | POA: Diagnosis not present

## 2016-03-23 DIAGNOSIS — I5032 Chronic diastolic (congestive) heart failure: Secondary | ICD-10-CM | POA: Diagnosis not present

## 2016-04-17 ENCOUNTER — Other Ambulatory Visit: Payer: Self-pay | Admitting: *Deleted

## 2016-04-17 MED ORDER — POTASSIUM CHLORIDE CRYS ER 20 MEQ PO TBCR: EXTENDED_RELEASE_TABLET | ORAL | Status: DC

## 2016-04-22 DIAGNOSIS — I5032 Chronic diastolic (congestive) heart failure: Secondary | ICD-10-CM | POA: Diagnosis not present

## 2016-04-29 DIAGNOSIS — E039 Hypothyroidism, unspecified: Secondary | ICD-10-CM | POA: Diagnosis not present

## 2016-05-19 ENCOUNTER — Encounter (HOSPITAL_COMMUNITY): Payer: Self-pay | Admitting: Emergency Medicine

## 2016-05-19 ENCOUNTER — Emergency Department (HOSPITAL_COMMUNITY)
Admission: EM | Admit: 2016-05-19 | Discharge: 2016-05-19 | Disposition: A | Discharge status: Home / Self Care | Payer: Commercial Managed Care - HMO | Attending: Physician Assistant | Admitting: Physician Assistant

## 2016-05-19 DIAGNOSIS — R350 Frequency of micturition: Secondary | ICD-10-CM | POA: Diagnosis not present

## 2016-05-19 DIAGNOSIS — I110 Hypertensive heart disease with heart failure: Secondary | ICD-10-CM | POA: Diagnosis not present

## 2016-05-19 DIAGNOSIS — J45909 Unspecified asthma, uncomplicated: Secondary | ICD-10-CM | POA: Insufficient documentation

## 2016-05-19 DIAGNOSIS — M7989 Other specified soft tissue disorders: Secondary | ICD-10-CM | POA: Diagnosis present

## 2016-05-19 DIAGNOSIS — Z87891 Personal history of nicotine dependence: Secondary | ICD-10-CM | POA: Insufficient documentation

## 2016-05-19 DIAGNOSIS — I5032 Chronic diastolic (congestive) heart failure: Secondary | ICD-10-CM | POA: Insufficient documentation

## 2016-05-19 DIAGNOSIS — Z7982 Long term (current) use of aspirin: Secondary | ICD-10-CM | POA: Diagnosis not present

## 2016-05-19 DIAGNOSIS — I2510 Atherosclerotic heart disease of native coronary artery without angina pectoris: Secondary | ICD-10-CM | POA: Insufficient documentation

## 2016-05-19 DIAGNOSIS — R600 Localized edema: Secondary | ICD-10-CM | POA: Diagnosis not present

## 2016-05-19 DIAGNOSIS — Z79899 Other long term (current) drug therapy: Secondary | ICD-10-CM | POA: Insufficient documentation

## 2016-05-19 DIAGNOSIS — E039 Hypothyroidism, unspecified: Secondary | ICD-10-CM | POA: Diagnosis not present

## 2016-05-19 DIAGNOSIS — J449 Chronic obstructive pulmonary disease, unspecified: Secondary | ICD-10-CM | POA: Diagnosis not present

## 2016-05-19 LAB — URINE MICROSCOPIC-ADD ON: WBC, UA: NONE SEEN WBC/hpf (ref 0–5)

## 2016-05-19 LAB — BASIC METABOLIC PANEL
Anion gap: 8 (ref 5–15)
BUN: 21 mg/dL — AB (ref 6–20)
CHLORIDE: 99 mmol/L — AB (ref 101–111)
CO2: 32 mmol/L (ref 22–32)
CREATININE: 1.14 mg/dL (ref 0.61–1.24)
Calcium: 9.7 mg/dL (ref 8.9–10.3)
GFR calc Af Amer: >60 mL/min (ref >60)
GFR calc non Af Amer: 57 mL/min — ABNORMAL LOW (ref >60)
Glucose, Bld: 152 mg/dL — ABNORMAL HIGH (ref 65–99)
Potassium: 4 mmol/L (ref 3.5–5.1)
SODIUM: 139 mmol/L (ref 135–145)

## 2016-05-19 LAB — URINALYSIS, ROUTINE W REFLEX MICROSCOPIC
Bilirubin Urine: NEGATIVE
Glucose, UA: NEGATIVE mg/dL
Ketones, ur: NEGATIVE mg/dL
Leukocytes, UA: NEGATIVE
Nitrite: NEGATIVE
Protein, ur: NEGATIVE mg/dL
Specific Gravity, Urine: 1.008 (ref 1.005–1.030)
pH: 6.5 (ref 5.0–8.0)

## 2016-05-19 LAB — CBC WITH DIFFERENTIAL/PLATELET
Basophils Absolute: 0 10*3/uL (ref 0.0–0.1)
Basophils Relative: 1 %
Eosinophils Absolute: 0.2 10*3/uL (ref 0.0–0.7)
Eosinophils Relative: 3 %
HCT: 44.8 % (ref 39.0–52.0)
Hemoglobin: 14.4 g/dL (ref 13.0–17.0)
Lymphocytes Relative: 31 %
Lymphs Abs: 2.5 10*3/uL (ref 0.7–4.0)
MCH: 28.2 pg (ref 26.0–34.0)
MCHC: 32.1 g/dL (ref 30.0–36.0)
MCV: 87.7 fL (ref 78.0–100.0)
Monocytes Absolute: 0.5 10*3/uL (ref 0.1–1.0)
Monocytes Relative: 6 %
Neutro Abs: 4.8 10*3/uL (ref 1.7–7.7)
Neutrophils Relative %: 59 %
Platelets: 137 10*3/uL — ABNORMAL LOW (ref 150–400)
RBC: 5.11 MIL/uL (ref 4.22–5.81)
RDW: 13.9 % (ref 11.5–15.5)
WBC: 8.1 10*3/uL (ref 4.0–10.5)

## 2016-05-19 LAB — CBG MONITORING, ED: Glucose-Capillary: 144 mg/dL — ABNORMAL HIGH (ref 65–99)

## 2016-05-19 NOTE — ED Provider Notes (Signed)
[facility] DEPT Provider Note   CSN: [id number] Arrival date & time: 05/19/16  [id number]  First Provider Contact:  None       History   Chief Complaint Chief Complaint  Patient presents with  . Urinary Frequency  . Leg Swelling    HPI [name] is a 82 y.o. male.  HPI with multiple medical problems including known coronary artery disease, COPD, diabetes, DVT, CHF, here for evaluation of urinary frequency and leg swelling. Patient reports over the past 3 weeks he has had worsening urinary frequency. Denies any dysuria, fevers, chills, nausea or vomiting, abdominal pain, back pain. He also reports leg swelling that is chronic for him, worse in the summer months. He denies any chest pain, shortness of breath, cough, PND, orthopnea, unilateral leg swelling. Cardiologist is Dr. Radford Pax. He reports he does not drink very much water due to his increased urinary habits. No other modifying factors.  Past Medical History:  Diagnosis Date  . Anxiety   . Asthma   . Chronic diastolic CHF (congestive heart failure) (HCC)    diastolic   . COPD (chronic obstructive pulmonary disease) ([place])   . Coronary artery disease    s/p PCI of RCA 03/2009 at which time there was 70% ramus and 90% RCA, cath 01/2011 showed patent stents in RCA with moderate prox disesae, aneurysmal left circ and small 90% ramus s/p PCI, patent LAD EF 55%  . Diabetes mellitus   . Diverticulitis Oct. 2013   bleeding in the past and Effient for his CAD was stopped  . DVT (deep venous thrombosis) ([place])   . GERD (gastroesophageal reflux disease)   . Hypercholesterolemia   . Hypertension   . Hypothyroidism   . Obesity (BMI 30-39.9)   . OSA (obstructive sleep apnea)    severe on CPAP  . Peripheral vascular disease ([place]) 11/2006   s/p left stent  . Shortness of breath   . Sleep apnea    severe OSA awaiting CPAP titration    Patient Active Problem List   Diagnosis Date Noted  . Dyspnea 11/16/2014  . Hypoxia 11/16/2014    . Aftercare following surgery of the circulatory system 08/03/2014  . Chronic diastolic CHF (congestive heart failure) ([place]) 06/21/2014  . Edema extremities 06/21/2014  . COPD GOLD III 12/27/2013  . Coronary artery disease   . OSA (obstructive sleep apnea)   . Obesity (BMI 30-39.9)   . Weakness of left leg 11/20/2012  . Left thigh pain 11/20/2012  . Atherosclerosis of native arteries of the extremities with intermittent claudication 04/01/2012  . WEIGHT GAIN, ABNORMAL 12/07/2009  . Dyslipidemia 11/21/2009  . HYPERTENSION, BENIGN 11/21/2009  . DEEP VENOUS THROMBOPHLEBITIS, LEFT, LEG, HX OF 11/21/2009    Past Surgical History:  Procedure Laterality Date  . ABDOMINAL AORTAGRAM N/A 04/20/2012   Procedure: ABDOMINAL Maxcine Ham;  Surgeon: Angelia Mould, MD;  Location: [facility] CATH LAB;  Service: Cardiovascular;  Laterality: N/A;  . ABDOMINAL AORTAGRAM N/A 12/29/2012   Procedure: ABDOMINAL Maxcine Ham;  Surgeon: Serafina Mitchell, MD;  Location: [facility] CATH LAB;  Service: Cardiovascular;  Laterality: N/A;  . BALLOON DILATION  12/26/2011   Procedure: BALLOON DILATION;  Surgeon: Garlan Fair, MD;  Location: [facility] ENDOSCOPY;  Service: Endoscopy;  Laterality: N/A;  . COLONOSCOPY  08/14/2012   Procedure: COLONOSCOPY;  Surgeon: Arta Silence, MD;  Location: [facility] ENDOSCOPY;  Service: Endoscopy;  Laterality: N/A;  . CORONARY STENT PLACEMENT  2010 and 2012  . ESOPHAGOGASTRODUODENOSCOPY  12/26/2011   Procedure: ESOPHAGOGASTRODUODENOSCOPY (  EGD);  Surgeon: Garlan Fair, MD;  Location: [facility] ENDOSCOPY;  Service: Endoscopy;  Laterality: N/A;  . ESOPHAGOGASTRODUODENOSCOPY  08/13/2012   Procedure: ESOPHAGOGASTRODUODENOSCOPY (EGD);  Surgeon: Arta Silence, MD;  Location: [facility] ENDOSCOPY;  Service: Endoscopy;  Laterality: Left;  . HERNIA REPAIR  1992   evntral hernia repair  . LOWER EXTREMITY ANGIOGRAM Bilateral 12/29/2012   Procedure: LOWER EXTREMITY ANGIOGRAM;  Surgeon: Serafina Mitchell, MD;  Location: [facility] CATH LAB;   Service: Cardiovascular;  Laterality: Bilateral;  bilat lower extrem angio       Home Medications    Prior to Admission medications   Medication Sig Start Date End Date Taking? Authorizing Provider  albuterol (PROVENTIL HFA;VENTOLIN HFA) 108 (90 BASE) MCG/ACT inhaler Inhale 2 puffs into the lungs every 6 (six) hours as needed for wheezing or shortness of breath (wheezing).    Yes Historical Provider, MD  albuterol (PROVENTIL) (2.5 MG/3ML) 0.083% nebulizer solution Take 3 mLs (2.5 mg total) by nebulization every 4 (four) hours as needed for wheezing or shortness of breath. 07/14/14  Yes Theodis Blaze, MD  Apple Cid Vn-Grn Tea-Bit Or-Cr (APPLE CIDER VINEGAR PLUS PO) Take 1 capsule by mouth daily.    Yes Historical Provider, MD  aspirin EC 81 MG tablet Take 81 mg by mouth daily.   Yes Historical Provider, MD  budesonide-formoterol (SYMBICORT) 80-4.5 MCG/ACT inhaler Inhale 2 puffs into the lungs 2 (two) times daily as needed (wheezing). FOR WHEEZING   Yes Historical Provider, MD  glimepiride (AMARYL) 2 MG tablet Take 2 mg by mouth daily.     Yes Historical Provider, MD  isosorbide mononitrate (IMDUR) 60 MG 24 hr tablet TAKE 1 TABLET (60 MG TOTAL) BY MOUTH DAILY. 03/13/16  Yes Sueanne Margarita, MD  latanoprost (XALATAN) 0.005 % ophthalmic solution Place 1 drop into both eyes at bedtime.    Yes Historical Provider, MD  levothyroxine (SYNTHROID, LEVOTHROID) 125 MCG tablet Take 250 mcg by mouth daily before breakfast.  03/14/16  Yes Historical Provider, MD  loperamide (IMODIUM A-D) 2 MG tablet Take 2 mg by mouth 4 (four) times daily as needed (loose stools). For diarrhea.   Yes Historical Provider, MD  meloxicam (MOBIC) 15 MG tablet Take 15 mg by mouth daily.   Yes Historical Provider, MD  metoprolol succinate (TOPROL-XL) 50 MG 24 hr tablet TAKE 1 TABLET BY MOUTH EVERY DAY 01/10/16  Yes Sueanne Margarita, MD  nitroGLYCERIN (NITROSTAT) 0.4 MG SL tablet Place 0.4 mg under the tongue every 5 (five) minutes x 3  doses as needed. For chest pain.   Yes Historical Provider, MD  NON FORMULARY Sleeps with CPAP Machine   Yes Historical Provider, MD  pantoprazole (PROTONIX) 40 MG tablet TAKE 1 TABLET (40 MG TOTAL) BY MOUTH 2 (TWO) TIMES DAILY. Patient taking differently: TAKE 1 TABLET (40 MG TOTAL) BY MOUTH DAILY. 09/12/15  Yes Sueanne Margarita, MD  potassium chloride SA (KLOR-CON M20) 20 MEQ tablet TAKE 1 TABLET (20 MEQ TOTAL) BY MOUTH DAILY. 04/17/16  Yes Sueanne Margarita, MD  rosuvastatin (CRESTOR) 40 MG tablet Take 1 tablet by mouth daily. 03/04/16  Yes Historical Provider, MD  ezetimibe-simvastatin (VYTORIN) 10-20 MG per tablet Take 0.5 tablets by mouth daily. Patient not taking: Reported on 05/19/2016 07/08/14   Sueanne Margarita, MD    Family History Family History  Problem Relation Age of Onset  . Diabetes Mother   . Hyperlipidemia Mother   . Hypertension Mother   . Heart attack Mother   .  Heart disease Mother     before age 46  . Diabetes Brother   . Heart disease Father     before age 7  . Breast cancer Sister   . Diabetes Sister   . Hyperlipidemia Sister   . Hypertension Sister   . Heart attack Sister   . Hyperlipidemia Sister   . Hypertension Sister   . Heart disease Sister     Heart Disease before age 55  . Asthma Sister   . Malignant hyperthermia Neg Hx     Social History Social History  Substance Use Topics  . Smoking status: Former Smoker    Packs/day: 0.50    Years: 15.00    Types: Cigarettes, Cigars    Quit date: 10/15/1963  . Smokeless tobacco: Never Used  . Alcohol use No     Allergies   Clopidogrel bisulfate   Review of Systems Review of Systems A 10 point review of systems was completed and was negative except for pertinent positives and negatives as mentioned in the history of present illness    Physical Exam Updated Vital Signs BP 157/97 (BP Location: Left Arm)   Pulse 76   Temp 98.4 F (36.9 C) (Oral)   Resp 18   Ht 5' 4.5" (1.638 m)   Wt 91.6 kg   SpO2  92%   BMI 34.14 kg/m   Physical Exam  Constitutional: He appears well-developed. No distress.  Awake, alert and nontoxic in appearance  HENT:  Head: Normocephalic and atraumatic.  Right Ear: External ear normal.  Left Ear: External ear normal.  Mouth/Throat: Oropharynx is clear and moist.  Eyes: Conjunctivae and EOM are normal. Pupils are equal, round, and reactive to light.  Neck: Normal range of motion. No JVD present.  Cardiovascular: Normal rate, regular rhythm and normal heart sounds.   Pulmonary/Chest: Effort normal and breath sounds normal. No stridor.  Abdominal: Soft. He exhibits no mass. There is no tenderness. There is no rebound and no guarding. No hernia.  Musculoskeletal: Normal range of motion.  1+ ankle edema, bilateral.  Neurological:  Awake, alert, cooperative and aware of situation; motor strength bilaterally; sensation normal to light touch bilaterally; no facial asymmetry; tongue midline; major cranial nerves appear intact;  baseline gait without new ataxia.  Skin: No rash noted. He is not diaphoretic.  Psychiatric: He has a normal mood and affect. His behavior is normal. Thought content normal.  Nursing note and vitals reviewed.    ED Treatments / Results  Labs (all labs ordered are listed, but only abnormal results are displayed) Labs Reviewed  URINALYSIS, ROUTINE W REFLEX MICROSCOPIC (NOT AT [facility]) - Abnormal; Notable for the following:       Result Value   Hgb urine dipstick TRACE (*)    All other components within normal limits  BASIC METABOLIC PANEL - Abnormal; Notable for the following:    Chloride 99 (*)    Glucose, Bld 152 (*)    BUN 21 (*)    GFR calc non Af Amer 57 (*)    All other components within normal limits  CBC WITH DIFFERENTIAL/PLATELET - Abnormal; Notable for the following:    Platelets 137 (*)    All other components within normal limits  URINE MICROSCOPIC-ADD ON - Abnormal; Notable for the following:    Squamous Epithelial / LPF  0-5 (*)    Bacteria, UA RARE (*)    All other components within normal limits  CBG MONITORING, ED - Abnormal; Notable for the following:  Glucose-Capillary 144 (*)    All other components within normal limits  URINE CULTURE    EKG  EKG Interpretation None       Radiology No results found.  Procedures Procedures (including critical care time)  Medications Ordered in ED Medications - No data to display   Initial Impression / Assessment and Plan / ED Course  I have reviewed the triage vital signs and the nursing notes.  Pertinent labs & imaging results that were available during my care of the patient were reviewed by me and considered in my medical decision making (see chart for details).  Clinical Course   Vitals:   05/19/16 1037  BP: 157/97  Pulse: 76  Resp: 18  Temp: 98.4 F (36.9 C)  TempSrc: Oral  SpO2: 92%  Weight: 91.6 kg  Height: 5' 4.5" (1.638 m)     Patient here for evaluation of urinary frequency over the past 3 weeks. On arrival, he is hemodynamically stable and afebrile. Physical exam was reassuring. Screening labs and urinalysis are also reassuring. There is only trace hemoglobin and urine, but no pain, doubt stone. No evidence of infection. Given referral to urology for reevaluation on Monday.   Bilateral ankle swelling is baseline for him, encouraged follow-up with cardiologist for further evaluation. No cardiopulmonary symptoms. Overall appears very well, nontoxic, hemodynamically stable and appropriate for discharge and outpatient follow-up.  Discussed strict return precautions with patient and daughter at bedside, they verbalized understanding, agreed with plan and subsequent discharge. Prior to patient discharge, I discussed and reviewed this case with Dr. Thomasene Lot     Final Clinical Impressions(s) / ED Diagnoses   Final diagnoses:  Urinary frequency  Bilateral lower extremity edema    New Prescriptions New Prescriptions   No  medications on file     Comer Locket, PA-C 05/19/16 [place], MD 05/19/16 1814

## 2016-05-19 NOTE — Discharge Instructions (Signed)
There does not appear to be an emergent cause for your symptoms at this time. Please follow-up with [facility] for further evaluation of your urinary frequency. Your exam and labs were very reassuring today. Please follow-up with your cardiologist for further evaluation of your leg swelling and medication management. Return to ED for new or worsening symptoms as we discussed.

## 2016-05-19 NOTE — ED Triage Notes (Signed)
Pt from home reports frequent urination and L ankle swelling. Pt denies any hx except cardiac stents approx 3 years ago. Pt has dyspnea on exertion and unable to lie flat in bed.. Pt spouse adds hx of COPD and DM II. Pt is A&O and ambulatory. Pt in NAD

## 2016-05-21 LAB — URINE CULTURE: Culture: NO GROWTH

## 2016-05-23 DIAGNOSIS — I5032 Chronic diastolic (congestive) heart failure: Secondary | ICD-10-CM | POA: Diagnosis not present

## 2016-06-07 DIAGNOSIS — N401 Enlarged prostate with lower urinary tract symptoms: Secondary | ICD-10-CM | POA: Diagnosis not present

## 2016-06-07 DIAGNOSIS — R350 Frequency of micturition: Secondary | ICD-10-CM | POA: Diagnosis not present

## 2016-06-19 DIAGNOSIS — J449 Chronic obstructive pulmonary disease, unspecified: Secondary | ICD-10-CM | POA: Diagnosis not present

## 2016-06-19 DIAGNOSIS — I2510 Atherosclerotic heart disease of native coronary artery without angina pectoris: Secondary | ICD-10-CM | POA: Diagnosis not present

## 2016-06-19 DIAGNOSIS — I739 Peripheral vascular disease, unspecified: Secondary | ICD-10-CM | POA: Diagnosis not present

## 2016-06-19 DIAGNOSIS — E039 Hypothyroidism, unspecified: Secondary | ICD-10-CM | POA: Diagnosis not present

## 2016-06-19 DIAGNOSIS — Z0000 Encounter for general adult medical examination without abnormal findings: Secondary | ICD-10-CM | POA: Diagnosis not present

## 2016-06-19 DIAGNOSIS — Z6837 Body mass index (BMI) 37.0-37.9, adult: Secondary | ICD-10-CM | POA: Diagnosis not present

## 2016-06-19 DIAGNOSIS — M150 Primary generalized (osteo)arthritis: Secondary | ICD-10-CM | POA: Diagnosis not present

## 2016-06-19 DIAGNOSIS — I110 Hypertensive heart disease with heart failure: Secondary | ICD-10-CM | POA: Diagnosis not present

## 2016-06-19 DIAGNOSIS — E782 Mixed hyperlipidemia: Secondary | ICD-10-CM | POA: Diagnosis not present

## 2016-06-19 DIAGNOSIS — E1159 Type 2 diabetes mellitus with other circulatory complications: Secondary | ICD-10-CM | POA: Diagnosis not present

## 2016-06-19 DIAGNOSIS — Z23 Encounter for immunization: Secondary | ICD-10-CM | POA: Diagnosis not present

## 2016-06-20 ENCOUNTER — Encounter: Payer: Self-pay | Admitting: Cardiology

## 2016-06-23 DIAGNOSIS — I5032 Chronic diastolic (congestive) heart failure: Secondary | ICD-10-CM | POA: Diagnosis not present

## 2016-07-23 DIAGNOSIS — I5032 Chronic diastolic (congestive) heart failure: Secondary | ICD-10-CM | POA: Diagnosis not present

## 2016-08-23 DIAGNOSIS — I5032 Chronic diastolic (congestive) heart failure: Secondary | ICD-10-CM | POA: Diagnosis not present

## 2016-09-02 ENCOUNTER — Other Ambulatory Visit: Payer: Self-pay | Admitting: Cardiology

## 2016-09-12 DIAGNOSIS — R351 Nocturia: Secondary | ICD-10-CM | POA: Diagnosis not present

## 2016-09-12 DIAGNOSIS — N401 Enlarged prostate with lower urinary tract symptoms: Secondary | ICD-10-CM | POA: Diagnosis not present

## 2016-09-22 ENCOUNTER — Inpatient Hospital Stay (HOSPITAL_COMMUNITY)
Admission: EM | Admit: 2016-09-22 | Discharge: 2016-09-25 | DRG: 872 | Disposition: A | Discharge status: Home / Self Care | Payer: Commercial Managed Care - HMO | Attending: Family Medicine | Admitting: Family Medicine

## 2016-09-22 ENCOUNTER — Emergency Department (HOSPITAL_COMMUNITY): Payer: Commercial Managed Care - HMO

## 2016-09-22 DIAGNOSIS — Z9119 Patient's noncompliance with other medical treatment and regimen: Secondary | ICD-10-CM

## 2016-09-22 DIAGNOSIS — E1151 Type 2 diabetes mellitus with diabetic peripheral angiopathy without gangrene: Secondary | ICD-10-CM | POA: Diagnosis not present

## 2016-09-22 DIAGNOSIS — R6510 Systemic inflammatory response syndrome (SIRS) of non-infectious origin without acute organ dysfunction: Secondary | ICD-10-CM | POA: Diagnosis present

## 2016-09-22 DIAGNOSIS — Z803 Family history of malignant neoplasm of breast: Secondary | ICD-10-CM

## 2016-09-22 DIAGNOSIS — I10 Essential (primary) hypertension: Secondary | ICD-10-CM

## 2016-09-22 DIAGNOSIS — E039 Hypothyroidism, unspecified: Secondary | ICD-10-CM | POA: Diagnosis present

## 2016-09-22 DIAGNOSIS — Z79899 Other long term (current) drug therapy: Secondary | ICD-10-CM

## 2016-09-22 DIAGNOSIS — A4189 Other specified sepsis: Secondary | ICD-10-CM | POA: Diagnosis not present

## 2016-09-22 DIAGNOSIS — R635 Abnormal weight gain: Secondary | ICD-10-CM

## 2016-09-22 DIAGNOSIS — E7800 Pure hypercholesterolemia, unspecified: Secondary | ICD-10-CM | POA: Diagnosis present

## 2016-09-22 DIAGNOSIS — I5032 Chronic diastolic (congestive) heart failure: Secondary | ICD-10-CM | POA: Diagnosis present

## 2016-09-22 DIAGNOSIS — T380X5A Adverse effect of glucocorticoids and synthetic analogues, initial encounter: Secondary | ICD-10-CM | POA: Diagnosis present

## 2016-09-22 DIAGNOSIS — I2583 Coronary atherosclerosis due to lipid rich plaque: Secondary | ICD-10-CM

## 2016-09-22 DIAGNOSIS — R0902 Hypoxemia: Secondary | ICD-10-CM

## 2016-09-22 DIAGNOSIS — E119 Type 2 diabetes mellitus without complications: Secondary | ICD-10-CM | POA: Diagnosis not present

## 2016-09-22 DIAGNOSIS — I110 Hypertensive heart disease with heart failure: Secondary | ICD-10-CM | POA: Diagnosis present

## 2016-09-22 DIAGNOSIS — I5030 Unspecified diastolic (congestive) heart failure: Secondary | ICD-10-CM | POA: Diagnosis not present

## 2016-09-22 DIAGNOSIS — Z8249 Family history of ischemic heart disease and other diseases of the circulatory system: Secondary | ICD-10-CM

## 2016-09-22 DIAGNOSIS — R0602 Shortness of breath: Secondary | ICD-10-CM | POA: Diagnosis not present

## 2016-09-22 DIAGNOSIS — A419 Sepsis, unspecified organism: Secondary | ICD-10-CM | POA: Diagnosis not present

## 2016-09-22 DIAGNOSIS — K219 Gastro-esophageal reflux disease without esophagitis: Secondary | ICD-10-CM | POA: Diagnosis present

## 2016-09-22 DIAGNOSIS — I2510 Atherosclerotic heart disease of native coronary artery without angina pectoris: Secondary | ICD-10-CM

## 2016-09-22 DIAGNOSIS — J209 Acute bronchitis, unspecified: Secondary | ICD-10-CM | POA: Diagnosis not present

## 2016-09-22 DIAGNOSIS — Z6836 Body mass index (BMI) 36.0-36.9, adult: Secondary | ICD-10-CM

## 2016-09-22 DIAGNOSIS — Z888 Allergy status to other drugs, medicaments and biological substances status: Secondary | ICD-10-CM

## 2016-09-22 DIAGNOSIS — J441 Chronic obstructive pulmonary disease with (acute) exacerbation: Secondary | ICD-10-CM | POA: Diagnosis not present

## 2016-09-22 DIAGNOSIS — Z9861 Coronary angioplasty status: Secondary | ICD-10-CM

## 2016-09-22 DIAGNOSIS — Z87891 Personal history of nicotine dependence: Secondary | ICD-10-CM | POA: Diagnosis not present

## 2016-09-22 DIAGNOSIS — I509 Heart failure, unspecified: Secondary | ICD-10-CM | POA: Diagnosis not present

## 2016-09-22 DIAGNOSIS — R062 Wheezing: Secondary | ICD-10-CM | POA: Diagnosis not present

## 2016-09-22 DIAGNOSIS — Z833 Family history of diabetes mellitus: Secondary | ICD-10-CM

## 2016-09-22 DIAGNOSIS — Z825 Family history of asthma and other chronic lower respiratory diseases: Secondary | ICD-10-CM

## 2016-09-22 DIAGNOSIS — Z7951 Long term (current) use of inhaled steroids: Secondary | ICD-10-CM

## 2016-09-22 DIAGNOSIS — E669 Obesity, unspecified: Secondary | ICD-10-CM | POA: Diagnosis present

## 2016-09-22 DIAGNOSIS — F419 Anxiety disorder, unspecified: Secondary | ICD-10-CM | POA: Diagnosis present

## 2016-09-22 DIAGNOSIS — E785 Hyperlipidemia, unspecified: Secondary | ICD-10-CM

## 2016-09-22 DIAGNOSIS — D72829 Elevated white blood cell count, unspecified: Secondary | ICD-10-CM | POA: Diagnosis present

## 2016-09-22 DIAGNOSIS — J440 Chronic obstructive pulmonary disease with acute lower respiratory infection: Secondary | ICD-10-CM | POA: Diagnosis not present

## 2016-09-22 DIAGNOSIS — Z7984 Long term (current) use of oral hypoglycemic drugs: Secondary | ICD-10-CM

## 2016-09-22 DIAGNOSIS — G4733 Obstructive sleep apnea (adult) (pediatric): Secondary | ICD-10-CM

## 2016-09-22 DIAGNOSIS — D696 Thrombocytopenia, unspecified: Secondary | ICD-10-CM | POA: Diagnosis not present

## 2016-09-22 DIAGNOSIS — Z7982 Long term (current) use of aspirin: Secondary | ICD-10-CM

## 2016-09-22 DIAGNOSIS — Z791 Long term (current) use of non-steroidal anti-inflammatories (NSAID): Secondary | ICD-10-CM

## 2016-09-22 DIAGNOSIS — R069 Unspecified abnormalities of breathing: Secondary | ICD-10-CM | POA: Diagnosis not present

## 2016-09-22 LAB — COMPREHENSIVE METABOLIC PANEL
ALT: 23 U/L (ref 17–63)
ANION GAP: 8 (ref 5–15)
AST: 34 U/L (ref 15–41)
Albumin: 4.9 g/dL (ref 3.5–5.0)
Alkaline Phosphatase: 47 U/L (ref 38–126)
BILIRUBIN TOTAL: 0.9 mg/dL (ref 0.3–1.2)
BUN: 16 mg/dL (ref 6–20)
CHLORIDE: 97 mmol/L — AB (ref 101–111)
CO2: 32 mmol/L (ref 22–32)
Calcium: 9.5 mg/dL (ref 8.9–10.3)
Creatinine, Ser: 1.22 mg/dL (ref 0.61–1.24)
GFR calc Af Amer: >60 mL/min (ref >60)
GFR, EST NON AFRICAN AMERICAN: 53 mL/min — AB (ref >60)
Glucose, Bld: 163 mg/dL — ABNORMAL HIGH (ref 65–99)
POTASSIUM: 4.1 mmol/L (ref 3.5–5.1)
Sodium: 137 mmol/L (ref 135–145)
TOTAL PROTEIN: 8.1 g/dL (ref 6.5–8.1)

## 2016-09-22 LAB — CBC WITH DIFFERENTIAL/PLATELET
BASOS ABS: 0 10*3/uL (ref 0.0–0.1)
Basophils Relative: 0 %
Eosinophils Absolute: 0.2 10*3/uL (ref 0.0–0.7)
Eosinophils Relative: 2 %
HEMATOCRIT: 45.2 % (ref 39.0–52.0)
HEMOGLOBIN: 14.4 g/dL (ref 13.0–17.0)
LYMPHS ABS: 1.9 10*3/uL (ref 0.7–4.0)
Lymphocytes Relative: 16 %
MCH: 29.1 pg (ref 26.0–34.0)
MCHC: 31.9 g/dL (ref 30.0–36.0)
MCV: 91.5 fL (ref 78.0–100.0)
Monocytes Absolute: 1 10*3/uL (ref 0.1–1.0)
Monocytes Relative: 9 %
NEUTROS ABS: 8.8 10*3/uL — AB (ref 1.7–7.7)
NEUTROS PCT: 73 %
PLATELETS: 132 10*3/uL — AB (ref 150–400)
RBC: 4.94 MIL/uL (ref 4.22–5.81)
RDW: 14.1 % (ref 11.5–15.5)
WBC: 12 10*3/uL — AB (ref 4.0–10.5)

## 2016-09-22 LAB — TSH: TSH: 10.936 u[IU]/mL — ABNORMAL HIGH (ref 0.350–4.500)

## 2016-09-22 LAB — BRAIN NATRIURETIC PEPTIDE: B NATRIURETIC PEPTIDE 5: 18.3 pg/mL (ref 0.0–100.0)

## 2016-09-22 LAB — URINALYSIS, ROUTINE W REFLEX MICROSCOPIC
BILIRUBIN URINE: NEGATIVE
Bacteria, UA: NONE SEEN
Glucose, UA: NEGATIVE mg/dL
KETONES UR: NEGATIVE mg/dL
LEUKOCYTES UA: NEGATIVE
Nitrite: NEGATIVE
PH: 5 (ref 5.0–8.0)
PROTEIN: NEGATIVE mg/dL
Specific Gravity, Urine: 1.012 (ref 1.005–1.030)

## 2016-09-22 LAB — CBG MONITORING, ED: GLUCOSE-CAPILLARY: 188 mg/dL — AB (ref 65–99)

## 2016-09-22 LAB — INFLUENZA PANEL BY PCR (TYPE A & B)
INFLAPCR: NEGATIVE
Influenza B By PCR: NEGATIVE

## 2016-09-22 LAB — TROPONIN I: Troponin I: <0.03 ng/mL (ref <0.03)

## 2016-09-22 LAB — I-STAT CG4 LACTIC ACID, ED
LACTIC ACID, VENOUS: 1.67 mmol/L (ref 0.5–1.9)
Lactic Acid, Venous: 1.26 mmol/L (ref 0.5–1.9)

## 2016-09-22 LAB — PROCALCITONIN: Procalcitonin: 0.13 ng/mL

## 2016-09-22 MED ORDER — ONDANSETRON HCL 4 MG/2ML IJ SOLN: 4.0000 mg | Freq: Four times a day (QID) | INTRAMUSCULAR | Status: DC | PRN

## 2016-09-22 MED ORDER — PANTOPRAZOLE SODIUM 40 MG PO TBEC
80.0000 mg | DELAYED_RELEASE_TABLET | Freq: Every day | ORAL | Status: DC
  Administered 2016-09-22 – 2016-09-24 (×3): 80 mg via ORAL
  Filled 2016-09-22 (×3): qty 2

## 2016-09-22 MED ORDER — ACETAMINOPHEN 500 MG PO TABS
1000.0000 mg | ORAL_TABLET | Freq: Once | ORAL | Status: AC
  Administered 2016-09-22: 1000 mg via ORAL
  Filled 2016-09-22: qty 2

## 2016-09-22 MED ORDER — ASPIRIN EC 81 MG PO TBEC
81.0000 mg | DELAYED_RELEASE_TABLET | Freq: Every day | ORAL | Status: DC
  Administered 2016-09-22 – 2016-09-24 (×3): 81 mg via ORAL
  Filled 2016-09-22 (×3): qty 1

## 2016-09-22 MED ORDER — PREDNISONE 20 MG PO TABS
60.0000 mg | ORAL_TABLET | Freq: Every day | ORAL | Status: DC
  Administered 2016-09-23 – 2016-09-24 (×2): 60 mg via ORAL
  Filled 2016-09-22 (×2): qty 3

## 2016-09-22 MED ORDER — DEXTROSE 5 % IV SOLN
500.0000 mg | Freq: Once | INTRAVENOUS | Status: AC
  Administered 2016-09-22: 500 mg via INTRAVENOUS
  Filled 2016-09-22: qty 500

## 2016-09-22 MED ORDER — ACETAMINOPHEN 325 MG PO TABS: 650.0000 mg | ORAL_TABLET | Freq: Four times a day (QID) | ORAL | Status: DC | PRN

## 2016-09-22 MED ORDER — IPRATROPIUM-ALBUTEROL 0.5-2.5 (3) MG/3ML IN SOLN
3.0000 mL | Freq: Three times a day (TID) | RESPIRATORY_TRACT | Status: DC
  Administered 2016-09-23 – 2016-09-24 (×4): 3 mL via RESPIRATORY_TRACT
  Filled 2016-09-22 (×4): qty 3

## 2016-09-22 MED ORDER — ROSUVASTATIN CALCIUM 20 MG PO TABS
20.0000 mg | ORAL_TABLET | Freq: Every day | ORAL | Status: DC
  Administered 2016-09-22 – 2016-09-24 (×3): 20 mg via ORAL
  Filled 2016-09-22 (×3): qty 1

## 2016-09-22 MED ORDER — ALBUTEROL SULFATE (2.5 MG/3ML) 0.083% IN NEBU: 2.5000 mg | INHALATION_SOLUTION | RESPIRATORY_TRACT | Status: DC | PRN

## 2016-09-22 MED ORDER — NITROGLYCERIN 0.4 MG SL SUBL: 0.4000 mg | SUBLINGUAL_TABLET | SUBLINGUAL | Status: DC | PRN

## 2016-09-22 MED ORDER — FUROSEMIDE 10 MG/ML IJ SOLN
40.0000 mg | Freq: Once | INTRAMUSCULAR | Status: AC
  Administered 2016-09-22: 40 mg via INTRAVENOUS
  Filled 2016-09-22: qty 4

## 2016-09-22 MED ORDER — ENOXAPARIN SODIUM 40 MG/0.4ML ~~LOC~~ SOLN
40.0000 mg | Freq: Every day | SUBCUTANEOUS | Status: DC
  Administered 2016-09-22 – 2016-09-24 (×3): 40 mg via SUBCUTANEOUS
  Filled 2016-09-22 (×3): qty 0.4

## 2016-09-22 MED ORDER — METHYLPREDNISOLONE SODIUM SUCC 125 MG IJ SOLR
125.0000 mg | Freq: Once | INTRAMUSCULAR | Status: AC
  Administered 2016-09-22: 125 mg via INTRAVENOUS
  Filled 2016-09-22: qty 2

## 2016-09-22 MED ORDER — INSULIN ASPART 100 UNIT/ML ~~LOC~~ SOLN
0.0000 [IU] | Freq: Every day | SUBCUTANEOUS | Status: DC
  Administered 2016-09-23: 2 [IU] via SUBCUTANEOUS
  Administered 2016-09-24: 3 [IU] via SUBCUTANEOUS

## 2016-09-22 MED ORDER — MIRABEGRON ER 25 MG PO TB24
25.0000 mg | ORAL_TABLET | Freq: Every day | ORAL | Status: DC
  Administered 2016-09-22 – 2016-09-24 (×3): 25 mg via ORAL
  Filled 2016-09-22 (×3): qty 1

## 2016-09-22 MED ORDER — DEXTROSE 5 % IV SOLN
1.0000 g | Freq: Once | INTRAVENOUS | Status: AC
  Administered 2016-09-22: 1 g via INTRAVENOUS
  Filled 2016-09-22: qty 10

## 2016-09-22 MED ORDER — ALBUTEROL SULFATE (2.5 MG/3ML) 0.083% IN NEBU: 2.5000 mg | INHALATION_SOLUTION | Freq: Four times a day (QID) | RESPIRATORY_TRACT | Status: DC

## 2016-09-22 MED ORDER — LEVOTHYROXINE SODIUM 50 MCG PO TABS
250.0000 ug | ORAL_TABLET | Freq: Every day | ORAL | Status: DC
  Administered 2016-09-23 – 2016-09-25 (×3): 250 ug via ORAL
  Filled 2016-09-22 (×2): qty 5
  Filled 2016-09-22: qty 10

## 2016-09-22 MED ORDER — POTASSIUM CHLORIDE CRYS ER 20 MEQ PO TBCR
20.0000 meq | EXTENDED_RELEASE_TABLET | Freq: Every day | ORAL | Status: DC
  Administered 2016-09-22: 20 meq via ORAL
  Filled 2016-09-22: qty 1

## 2016-09-22 MED ORDER — IPRATROPIUM-ALBUTEROL 0.5-2.5 (3) MG/3ML IN SOLN
3.0000 mL | Freq: Once | RESPIRATORY_TRACT | Status: AC
  Administered 2016-09-22: 3 mL via RESPIRATORY_TRACT
  Filled 2016-09-22: qty 3

## 2016-09-22 MED ORDER — CEFTRIAXONE SODIUM 1 G IJ SOLR
1.0000 g | INTRAMUSCULAR | Status: DC
  Administered 2016-09-23 – 2016-09-24 (×2): 1 g via INTRAVENOUS
  Filled 2016-09-22 (×2): qty 10

## 2016-09-22 MED ORDER — IPRATROPIUM BROMIDE 0.02 % IN SOLN: 0.5000 mg | Freq: Four times a day (QID) | RESPIRATORY_TRACT | Status: DC

## 2016-09-22 MED ORDER — GUAIFENESIN ER 600 MG PO TB12
600.0000 mg | ORAL_TABLET | Freq: Two times a day (BID) | ORAL | Status: DC
  Administered 2016-09-22 – 2016-09-25 (×6): 600 mg via ORAL
  Filled 2016-09-22 (×6): qty 1

## 2016-09-22 MED ORDER — ALFUZOSIN HCL ER 10 MG PO TB24
10.0000 mg | ORAL_TABLET | Freq: Every day | ORAL | Status: DC
Start: 2016-09-22 — End: 2016-09-25
  Administered 2016-09-22 – 2016-09-24 (×3): 10 mg via ORAL
  Filled 2016-09-22 (×3): qty 1

## 2016-09-22 MED ORDER — INSULIN ASPART 100 UNIT/ML ~~LOC~~ SOLN
0.0000 [IU] | Freq: Three times a day (TID) | SUBCUTANEOUS | Status: DC
  Administered 2016-09-23: 5 [IU] via SUBCUTANEOUS
  Administered 2016-09-23: 2 [IU] via SUBCUTANEOUS
  Administered 2016-09-24: 3 [IU] via SUBCUTANEOUS
  Administered 2016-09-24 (×2): 2 [IU] via SUBCUTANEOUS
  Administered 2016-09-25: 3 [IU] via SUBCUTANEOUS

## 2016-09-22 MED ORDER — METOPROLOL SUCCINATE ER 50 MG PO TB24
50.0000 mg | ORAL_TABLET | Freq: Every day | ORAL | Status: DC
  Administered 2016-09-22 – 2016-09-24 (×3): 50 mg via ORAL
  Filled 2016-09-22 (×3): qty 1

## 2016-09-22 MED ORDER — DEXTROSE 5 % IV SOLN
500.0000 mg | INTRAVENOUS | Status: DC
  Administered 2016-09-23 – 2016-09-24 (×2): 500 mg via INTRAVENOUS
  Filled 2016-09-22 (×2): qty 500

## 2016-09-22 MED ORDER — ACETAMINOPHEN 650 MG RE SUPP: 650.0000 mg | Freq: Four times a day (QID) | RECTAL | Status: DC | PRN

## 2016-09-22 MED ORDER — LATANOPROST 0.005 % OP SOLN
1.0000 [drp] | Freq: Every day | OPHTHALMIC | Status: DC
  Administered 2016-09-22 – 2016-09-24 (×3): 1 [drp] via OPHTHALMIC
  Filled 2016-09-22: qty 2.5

## 2016-09-22 MED ORDER — ISOSORBIDE MONONITRATE ER 30 MG PO TB24
60.0000 mg | ORAL_TABLET | Freq: Every day | ORAL | Status: DC
  Administered 2016-09-22 – 2016-09-24 (×3): 60 mg via ORAL
  Filled 2016-09-22 (×3): qty 2

## 2016-09-22 MED ORDER — ONDANSETRON HCL 4 MG PO TABS: 4.0000 mg | ORAL_TABLET | Freq: Four times a day (QID) | ORAL | Status: DC | PRN

## 2016-09-22 NOTE — ED Notes (Signed)
PT TRANSPORTED TO THE FLOOR.

## 2016-09-22 NOTE — ED Triage Notes (Signed)
Per EMS, patient from home, c/o increased SOB since last night. Hx COPD. Used inhaler at home with no relief. 15mg  Albuterol and 1mg  Atrovent with EMS.

## 2016-09-22 NOTE — ED Notes (Signed)
PT REFUSED 2ND BLOOD CULTURE DRAW. HE STS HE DOESN'T WANT TO BE STUCK ANYMORE.

## 2016-09-22 NOTE — H&P (Addendum)
History and Physical    [name] [id number] DOB: 1930/03/27 DOA: 09/22/2016  Referring MD/NP/PA: Dr. Regenia Skeeter PCP: [name], MD  Patient coming from: home Chief Complaint: Shortness of breath  HPI: [name] is a 82 y.o. male with medical history significant of HTN, HLD, DM type II, COPD, CAD s/p PCI, dCHF, PVD, OSA on CPAP; who presents with complaints of progressively worsening shortness of breath and malaise. History is obtained by the patient and 2 daughters present at bedside. Symptoms have been progressively worsening over the last couple of weeks most noticeable within the last week. Normally patients only on oxygen at night He had reported complaints of chest tightness and intermittent nonproductive cough. Using his albuterol at home provided only minimal relief. Patient tried taking Alka-Seltzer plus and Mucinex without relief of symptoms. 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 Patient notes that his weight has been going up was approximately 212 pounds proximally 2 weeks ago. He admits that he intermittently misses medication doses, but states that he is fairly consistent. Associated symptoms included generalized weakness, abdominal distention, and decreased appetite. Denies any significant fever, nausea, vomiting, diarrhea, or falls. Patient gives < 20 smoking pack year history.  ED Course: Upon admission into the emergency department Patient was found to befebrile up to 101.42F, tachycardic, tachypneic, O2 saturations maintained on room air, and blood pressures up to 191/94.Lab work revealed WBC 12, lactic acid 1.67, negative troponin, and all other labs relatively within normal limits. CXR revealed signs of airway thickening suggestive of bronchitis vs. reactive airway disease.  Review of Systems: As per HPI otherwise 10 point review of  systems negative.   Past Medical History:  Diagnosis Date  . Anxiety   . Asthma   . Chronic diastolic CHF (congestive heart failure) (HCC)    diastolic   . COPD (chronic obstructive pulmonary disease) ([place])   . Coronary artery disease    s/p PCI of RCA 03/2009 at which time there was 70% ramus and 90% RCA, cath 01/2011 showed patent stents in RCA with moderate prox disesae, aneurysmal left circ and small 90% ramus s/p PCI, patent LAD EF 55%  . Diabetes mellitus   . Diverticulitis Oct. 2013   bleeding in the past and Effient for his CAD was stopped  . DVT (deep venous thrombosis) ([place])   . GERD (gastroesophageal reflux disease)   . Hypercholesterolemia   . Hypertension   . Hypothyroidism   . Obesity (BMI 30-39.9)   . OSA (obstructive sleep apnea)    severe on CPAP  . Peripheral vascular disease ([place]) 11/2006   s/p left stent  . Shortness of breath   . Sleep apnea    severe OSA awaiting CPAP titration    Past Surgical History:  Procedure Laterality Date  . ABDOMINAL AORTAGRAM N/A 04/20/2012   Procedure: ABDOMINAL Maxcine Ham;  Surgeon: Angelia Mould, MD;  Location: [facility] CATH LAB;  Service: Cardiovascular;  Laterality: N/A;  . ABDOMINAL AORTAGRAM N/A 12/29/2012   Procedure: ABDOMINAL Maxcine Ham;  Surgeon: Serafina Mitchell, MD;  Location: [facility] CATH LAB;  Service: Cardiovascular;  Laterality: N/A;  . BALLOON DILATION  12/26/2011   Procedure: BALLOON DILATION;  Surgeon: Garlan Fair, MD;  Location: [facility] ENDOSCOPY;  Service: Endoscopy;  Laterality: N/A;  . COLONOSCOPY  08/14/2012   Procedure: COLONOSCOPY;  Surgeon: Arta Silence, MD;  Location: [facility]  ENDOSCOPY;  Service: Endoscopy;  Laterality: N/A;  . CORONARY STENT PLACEMENT  2010 and 2012  . ESOPHAGOGASTRODUODENOSCOPY  12/26/2011   Procedure: ESOPHAGOGASTRODUODENOSCOPY (EGD);  Surgeon: Garlan Fair, MD;  Location: [facility] ENDOSCOPY;  Service: Endoscopy;  Laterality: N/A;  . ESOPHAGOGASTRODUODENOSCOPY  08/13/2012   Procedure:  ESOPHAGOGASTRODUODENOSCOPY (EGD);  Surgeon: Arta Silence, MD;  Location: [facility] ENDOSCOPY;  Service: Endoscopy;  Laterality: Left;  . HERNIA REPAIR  1992   evntral hernia repair  . LOWER EXTREMITY ANGIOGRAM Bilateral 12/29/2012   Procedure: LOWER EXTREMITY ANGIOGRAM;  Surgeon: Serafina Mitchell, MD;  Location: [facility] CATH LAB;  Service: Cardiovascular;  Laterality: Bilateral;  bilat lower extrem angio     reports that he quit smoking about 52 years ago. His smoking use included Cigarettes and Cigars. He has a 7.50 pack-year smoking history. He has never used smokeless tobacco. He reports that he does not drink alcohol or use drugs.  Allergies  Allergen Reactions  . Clopidogrel Itching    Family History  Problem Relation Age of Onset  . Diabetes Mother   . Hyperlipidemia Mother   . Hypertension Mother   . Heart attack Mother   . Heart disease Mother     before age 72  . Diabetes Brother   . Heart disease Father     before age 19  . Breast cancer Sister   . Diabetes Sister   . Hyperlipidemia Sister   . Hypertension Sister   . Heart attack Sister   . Hyperlipidemia Sister   . Hypertension Sister   . Heart disease Sister     Heart Disease before age 67  . Asthma Sister   . Malignant hyperthermia Neg Hx     Prior to Admission medications   Medication Sig Start Date End Date Taking? Authorizing Provider  albuterol (PROVENTIL HFA;VENTOLIN HFA) 108 (90 BASE) MCG/ACT inhaler Inhale 2 puffs into the lungs every 6 (six) hours as needed for wheezing or shortness of breath.    Yes Historical Provider, MD  albuterol (PROVENTIL) (2.5 MG/3ML) 0.083% nebulizer solution Take 3 mLs (2.5 mg total) by nebulization every 4 (four) hours as needed for wheezing or shortness of breath. 07/14/14  Yes Theodis Blaze, MD  alfuzosin (UROXATRAL) 10 MG 24 hr tablet Take 10 mg by mouth at bedtime.   Yes Historical Provider, MD  Apple Cid Vn-Grn Tea-Bit Or-Cr (APPLE CIDER VINEGAR PLUS PO) Take 1 capsule by mouth at  bedtime.    Yes Historical Provider, MD  aspirin EC 81 MG tablet Take 81 mg by mouth at bedtime.    Yes Historical Provider, MD  budesonide-formoterol (SYMBICORT) 160-4.5 MCG/ACT inhaler Inhale 2 puffs into the lungs 2 (two) times daily.   Yes Historical Provider, MD  glimepiride (AMARYL) 2 MG tablet Take 2 mg by mouth at bedtime.    Yes Historical Provider, MD  isosorbide mononitrate (IMDUR) 60 MG 24 hr tablet Take 60 mg by mouth at bedtime.   Yes Historical Provider, MD  latanoprost (XALATAN) 0.005 % ophthalmic solution Place 1 drop into both eyes at bedtime.    Yes Historical Provider, MD  levothyroxine (SYNTHROID, LEVOTHROID) 125 MCG tablet Take 250 mcg by mouth daily before breakfast.    Yes Historical Provider, MD  loperamide (IMODIUM) 2 MG capsule Take 2 mg by mouth as needed for diarrhea or loose stools.   Yes Historical Provider, MD  meloxicam (MOBIC) 15 MG tablet Take 15 mg by mouth at bedtime.    Yes Historical Provider, MD  metoprolol  succinate (TOPROL-XL) 50 MG 24 hr tablet Take 50 mg by mouth at bedtime. Take with or immediately following a meal.   Yes Historical Provider, MD  mirabegron ER (MYRBETRIQ) 25 MG TB24 tablet Take 25 mg by mouth at bedtime.   Yes Historical Provider, MD  nitroGLYCERIN (NITROSTAT) 0.4 MG SL tablet Place 0.4 mg under the tongue every 5 (five) minutes x 3 doses as needed for chest pain. For chest pain.   Yes Historical Provider, MD  pantoprazole (PROTONIX) 40 MG tablet Take 80 mg by mouth at bedtime.   Yes Historical Provider, MD  potassium chloride SA (K-DUR,KLOR-CON) 20 MEQ tablet Take 20 mEq by mouth at bedtime.   Yes Historical Provider, MD  rosuvastatin (CRESTOR) 20 MG tablet Take 20 mg by mouth at bedtime.   Yes Historical Provider, MD    Physical Exam:   Constitutional: Obese Elderly male in some mild distress appears ill, but nontoxic able to follow commands. Vitals:   09/22/16 1754 09/22/16 1944 09/22/16 2002 09/22/16 2100  BP: 191/94  151/84  132/80  Pulse: (!) 123  117 109  Resp: (!) 30  21 20   Temp: 101.4 F (38.6 C)  100.2 F (37.9 C)   TempSrc: Oral  Oral   SpO2: 98%  98% 98%  Weight:  95.3 kg (210 lb)    Height:  5\' 4"  (1.626 m)    Eyes: PERRL, lids and conjunctivae normal ENMT: Mucous membranes are moist. Posterior pharynx clear of any exudate or lesions..  Neck: normal, supple, no masses, no thyromegaly Respiratory: Tachypneic, with decreased overall air movement and expiratory wheezes wheezes appreciated. Patient currently on 2 L nasal cannula oxygen. Cardiovascular: Tachycardic, no murmurs / rubs / gallops. +2 pitting lower extremity edema. 2+ pedal pulses. No carotid bruits.  Abdomen: Moderate abdominal distention, no tenderness, no masses palpated. No hepatosplenomegaly. Bowel sounds positive.  Musculoskeletal: positive clubbing / no cyanosis. No joint deformity upper and lower extremities. Good ROM, no contractures. Normal muscle tone.  Skin: Dark lesions which family notes present for less than a year's time start out red in color below bilateral knees  Neurologic: CN 2-12 grossly intact. Sensation intact, DTR normal. Strength 5/5 in all 4.  Psychiatric: Normal judgment and insight. Alert and oriented x 3. Normal mood.    Labs on Admission: I have personally reviewed following labs and imaging studies  CBC:  Recent Labs Lab 09/22/16 1853  WBC 12.0*  NEUTROABS 8.8*  HGB 14.4  HCT 45.2  MCV 91.5  PLT [id number]*   Basic Metabolic Panel:  Recent Labs Lab 09/22/16 1853  NA 137  K 4.1  CL 97*  CO2 32  GLUCOSE 163*  BUN 16  CREATININE 1.22  CALCIUM 9.5   GFR: Estimated Creatinine Clearance: 46.9 mL/min (by C-G formula based on SCr of 1.22 mg/dL). Liver Function Tests:  Recent Labs Lab 09/22/16 1853  AST 34  ALT 23  ALKPHOS 47  BILITOT 0.9  PROT 8.1  ALBUMIN 4.9   No results for input(s): LIPASE, AMYLASE in the last 168 hours. No results for input(s): AMMONIA in the last 168  hours. Coagulation Profile: No results for input(s): INR, PROTIME in the last 168 hours. Cardiac Enzymes:  Recent Labs Lab 09/22/16 1853  TROPONINI <0.03   BNP (last 3 results) No results for input(s): PROBNP in the last 8760 hours. HbA1C: No results for input(s): HGBA1C in the last 72 hours. CBG: No results for input(s): GLUCAP in the last 168 hours. Lipid Profile: No  results for input(s): CHOL, HDL, LDLCALC, TRIG, CHOLHDL, LDLDIRECT in the last 72 hours. Thyroid Function Tests: No results for input(s): TSH, T4TOTAL, FREET4, T3FREE, THYROIDAB in the last 72 hours. Anemia Panel: No results for input(s): VITAMINB12, FOLATE, FERRITIN, TIBC, IRON, RETICCTPCT in the last 72 hours. Urine analysis:    Component Value Date/Time   COLORURINE STRAW (A) 09/22/2016 1807   APPEARANCEUR CLEAR 09/22/2016 1807   LABSPEC 1.012 09/22/2016 1807   PHURINE 5.0 09/22/2016 1807   GLUCOSEU NEGATIVE 09/22/2016 1807   HGBUR MODERATE (A) 09/22/2016 1807   BILIRUBINUR NEGATIVE 09/22/2016 1807   KETONESUR NEGATIVE 09/22/2016 1807   PROTEINUR NEGATIVE 09/22/2016 1807   UROBILINOGEN 0.2 07/12/2014 1702   NITRITE NEGATIVE 09/22/2016 1807   LEUKOCYTESUR NEGATIVE 09/22/2016 1807   Sepsis Labs: No results found for this or any previous visit (from the past 240 hour(s)).   Radiological Exams on Admission: Dg Chest Port 1 View  Result Date: 09/22/2016 CLINICAL DATA:  Patient from home, c/o increased SOB since last night. Hx COPD, asthma, CHF, CAD, Diabetes, HTN. Used inhaler at home with no relief. Former smoker. EXAM: PORTABLE CHEST 1 VIEW COMPARISON:  11/17/2014 chest CT FINDINGS: Thoracic spondylosis. Upper normal heart size. Densely calcified left mid lung pulmonary nodule, stable. Airway thickening is present, suggesting bronchitis or reactive airways disease. Mildly prominent main pulmonary arterial contour. No airspace opacity is identified. IMPRESSION: 1. Airway thickening is present, suggesting  bronchitis or reactive airways disease. 2. Thoracic spondylosis. 3. Mildly prominent main pulmonary artery could reflect pulmonary arterial hypertension. Electronically Signed   By: Van Clines M.D.   On: 09/22/2016 18:56    EKG: Independently reviewed. Sinus tachycardia 129 bpm  Assessment/Plan  SIRS: Acute. Patient presents febrile up to 101.28F, tachycardic, tachypneic, WBC 12, and lactate acid just 1.67. Acute concern for possible respiratory issues . CXR signs of airway thickening suggestive of bronchitis versus reactive airway disease. Urinalysis negative. - Admit to telemetry bed - Follow-up flu and blood cultures - Check procalcitonin, Urine legionella, urine strep - Continue empiric antibiotics of ceftriaxone and azithromycin  Bronchitis/question of COPD exacerbation : Patient given 125 mg of Solu-Medrol IV in the ED. - DuoNeb's TID and prn SOB /wheezing - Budesonide and Brovana nebs  - Changed from IV to po Prednisone 60 mg daily given history of DM - Mucinex  Diastolic congestive heart failure: Patient does appear to be fluid overloaded with 2+ pitting edema in the bilateral lower extremities, but BNP 18.3. Question of BNP falsely low secondary to patient's weight or if patient's not been taking his thyroid medicine. - strict I&Os   - Get Lasix 40 mg IV 1 dose now, we'll need to reassess for further need of diuresis in a.m. or if related to hypothyroidism. - Check echocardiogram in a.m. Unless  another possible cause warrants discontinuation  Diabetes mellitus type 2: Patient reports only being on - Hypoglycemic protocol - Hold Amaryl - CBG Every before meals and at bedtime  Essential hypertension - Continue metoprolol and isosorbide mononitrate  Hyperlipidemia - Continue Crestor  Thrombocytopenia: Chronic. Patient with intermittent Low platelet counts noted dating back to 2015. - Continue to monitor  CAD s/p PCI - Continue aspirin   Hypothyroidism: Patient  reports weight gain and there is some note of intermittent compliance with medication. - check TSH - Continue levothyroxine  Peripheral vascular disease   OSA: Patient reports using CPAP at night - RT to supply CPAP  GERD - Continue Protonix  DVT prophylaxis: Lovenox Code Status:  Full Code Family Communication: Discussed overall plan of care with the patient and family present at bedside Disposition Plan: Likely discharge home once medically stable Possibly less than 2 midnight Consults called: None Admission status: Observation  Norval Morton MD [facility] Pager [phone]  If 7PM-7AM, please contact night-coverage [email or web address] Password [facility]  09/22/2016, 9:29 PM

## 2016-09-22 NOTE — ED Notes (Signed)
Pt refused second set of blood cultures.  RN aware.

## 2016-09-22 NOTE — Progress Notes (Signed)
Pharmacy Antibiotic Note  [name] is a 82 y.o. male with hx of COPD c/o increased SOB admitted on 09/22/2016 with CAP .  Pharmacy has been consulted for Rocephin/Zmax dosing.  Plan: Rocephin 1gm IV q24h Zmax 500mg  IV q24h     Temp (24hrs), Avg:101.4 F (38.6 C), Min:101.4 F (38.6 C), Max:101.4 F (38.6 C)  No results for input(s): WBC, CREATININE, LATICACIDVEN, VANCOTROUGH, VANCOPEAK, VANCORANDOM, GENTTROUGH, GENTPEAK, GENTRANDOM, TOBRATROUGH, TOBRAPEAK, TOBRARND, AMIKACINPEAK, AMIKACINTROU, AMIKACIN in the last 168 hours.  CrCl cannot be calculated (Patient's most recent lab result is older than the maximum 21 days allowed.).    Allergies  Allergen Reactions  . Clopidogrel Bisulfate Itching    Antimicrobials this admission: 12/10 rocephin >>  12/10 zmax >>   Dose adjustments this admission:   Microbiology results:  BCx:   UCx:    Sputum:   MRSA PCR:  Thank you for allowing pharmacy to be a part of this patient's care.  Rx will sign off as no further dose adjustments are required.   Dorrene German 09/22/2016 6:19 PM

## 2016-09-22 NOTE — ED Notes (Signed)
Admitting Provider at bedside. 

## 2016-09-22 NOTE — ED Notes (Signed)
Writer attempted to draw blood work, unsuccessful attempt 2X

## 2016-09-22 NOTE — ED Provider Notes (Signed)
[place] DEPT Provider Note   CSN: [id number] Arrival date & time: 09/22/16  1735     History   Chief Complaint No chief complaint on file.   HPI [name] is a 82 y.o. male.  HPI  82 year old male with a history of COPD, diastolic CHF, DVT, and asthma presents with shortness of breath. History is taken from patient and daughters. Patient has been having acute on chronic soreness of breath over this past 1 week. No known fevers until today. Little bit of a cough this morning. The shortest breath seems dramatically worse today. EMS gave albuterol with moderate improvement. He denies any chest pain but has had some tightness. He was given 15 mg albuterol and one mg Atrovent by EMS. Patient has chronic bilateral lower extremity edema that has been progressively worse over the last several months.  Past Medical History:  Diagnosis Date  . Anxiety   . Asthma   . Chronic diastolic CHF (congestive heart failure) (HCC)    diastolic   . COPD (chronic obstructive pulmonary disease) ([place])   . Coronary artery disease    s/p PCI of RCA 03/2009 at which time there was 70% ramus and 90% RCA, cath 01/2011 showed patent stents in RCA with moderate prox disesae, aneurysmal left circ and small 90% ramus s/p PCI, patent LAD EF 55%  . Diabetes mellitus   . Diverticulitis Oct. 2013   bleeding in the past and Effient for his CAD was stopped  . DVT (deep venous thrombosis) ([place])   . GERD (gastroesophageal reflux disease)   . Hypercholesterolemia   . Hypertension   . Hypothyroidism   . Obesity (BMI 30-39.9)   . OSA (obstructive sleep apnea)    severe on CPAP  . Peripheral vascular disease ([place]) 11/2006   s/p left stent  . Shortness of breath   . Sleep apnea    severe OSA awaiting CPAP titration    Patient Active Problem List   Diagnosis Date Noted  . Dyspnea 11/16/2014  . Hypoxia 11/16/2014  . Aftercare following surgery of the circulatory system 08/03/2014  . Chronic diastolic CHF  (congestive heart failure) ([place]) 06/21/2014  . Edema extremities 06/21/2014  . COPD GOLD III 12/27/2013  . Coronary artery disease   . OSA (obstructive sleep apnea)   . Obesity (BMI 30-39.9)   . Weakness of left leg 11/20/2012  . Left thigh pain 11/20/2012  . Atherosclerosis of native arteries of the extremities with intermittent claudication 04/01/2012  . WEIGHT GAIN, ABNORMAL 12/07/2009  . Dyslipidemia 11/21/2009  . HYPERTENSION, BENIGN 11/21/2009  . DEEP VENOUS THROMBOPHLEBITIS, LEFT, LEG, HX OF 11/21/2009    Past Surgical History:  Procedure Laterality Date  . ABDOMINAL AORTAGRAM N/A 04/20/2012   Procedure: ABDOMINAL Maxcine Ham;  Surgeon: Angelia Mould, MD;  Location: [facility] CATH LAB;  Service: Cardiovascular;  Laterality: N/A;  . ABDOMINAL AORTAGRAM N/A 12/29/2012   Procedure: ABDOMINAL Maxcine Ham;  Surgeon: Serafina Mitchell, MD;  Location: [facility] CATH LAB;  Service: Cardiovascular;  Laterality: N/A;  . BALLOON DILATION  12/26/2011   Procedure: BALLOON DILATION;  Surgeon: Garlan Fair, MD;  Location: [facility] ENDOSCOPY;  Service: Endoscopy;  Laterality: N/A;  . COLONOSCOPY  08/14/2012   Procedure: COLONOSCOPY;  Surgeon: Arta Silence, MD;  Location: [facility] ENDOSCOPY;  Service: Endoscopy;  Laterality: N/A;  . CORONARY STENT PLACEMENT  2010 and 2012  . ESOPHAGOGASTRODUODENOSCOPY  12/26/2011   Procedure: ESOPHAGOGASTRODUODENOSCOPY (EGD);  Surgeon: Garlan Fair, MD;  Location: [facility] ENDOSCOPY;  Service: Endoscopy;  Laterality: N/A;  . ESOPHAGOGASTRODUODENOSCOPY  08/13/2012   Procedure: ESOPHAGOGASTRODUODENOSCOPY (EGD);  Surgeon: Arta Silence, MD;  Location: [facility] ENDOSCOPY;  Service: Endoscopy;  Laterality: Left;  . HERNIA REPAIR  1992   evntral hernia repair  . LOWER EXTREMITY ANGIOGRAM Bilateral 12/29/2012   Procedure: LOWER EXTREMITY ANGIOGRAM;  Surgeon: Serafina Mitchell, MD;  Location: [facility] CATH LAB;  Service: Cardiovascular;  Laterality: Bilateral;  bilat lower extrem angio       Home  Medications    Prior to Admission medications   Medication Sig Start Date End Date Taking? Authorizing Provider  albuterol (PROVENTIL HFA;VENTOLIN HFA) 108 (90 BASE) MCG/ACT inhaler Inhale 2 puffs into the lungs every 6 (six) hours as needed for wheezing or shortness of breath (wheezing).     Historical Provider, MD  albuterol (PROVENTIL) (2.5 MG/3ML) 0.083% nebulizer solution Take 3 mLs (2.5 mg total) by nebulization every 4 (four) hours as needed for wheezing or shortness of breath. 07/14/14   Theodis Blaze, MD  Apple Cid Vn-Grn Tea-Bit Or-Cr (APPLE CIDER VINEGAR PLUS PO) Take 1 capsule by mouth daily.     Historical Provider, MD  aspirin EC 81 MG tablet Take 81 mg by mouth daily.    Historical Provider, MD  budesonide-formoterol (SYMBICORT) 80-4.5 MCG/ACT inhaler Inhale 2 puffs into the lungs 2 (two) times daily as needed (wheezing). FOR WHEEZING    Historical Provider, MD  ezetimibe-simvastatin (VYTORIN) 10-20 MG per tablet Take 0.5 tablets by mouth daily. Patient not taking: Reported on 05/19/2016 07/08/14   Sueanne Margarita, MD  glimepiride (AMARYL) 2 MG tablet Take 2 mg by mouth daily.      Historical Provider, MD  isosorbide mononitrate (IMDUR) 60 MG 24 hr tablet TAKE 1 TABLET (60 MG TOTAL) BY MOUTH DAILY. 03/13/16   Sueanne Margarita, MD  latanoprost (XALATAN) 0.005 % ophthalmic solution Place 1 drop into both eyes at bedtime.     Historical Provider, MD  levothyroxine (SYNTHROID, LEVOTHROID) 125 MCG tablet Take 250 mcg by mouth daily before breakfast.  03/14/16   Historical Provider, MD  loperamide (IMODIUM A-D) 2 MG tablet Take 2 mg by mouth 4 (four) times daily as needed (loose stools). For diarrhea.    Historical Provider, MD  meloxicam (MOBIC) 15 MG tablet Take 15 mg by mouth daily.    Historical Provider, MD  metoprolol succinate (TOPROL-XL) 50 MG 24 hr tablet TAKE 1 TABLET BY MOUTH EVERY DAY 01/10/16   Sueanne Margarita, MD  nitroGLYCERIN (NITROSTAT) 0.4 MG SL tablet Place 0.4 mg under the tongue  every 5 (five) minutes x 3 doses as needed. For chest pain.    Historical Provider, MD  NON FORMULARY Sleeps with CPAP Machine    Historical Provider, MD  pantoprazole (PROTONIX) 40 MG tablet TAKE 1 TABLET (40 MG TOTAL) BY MOUTH 2 (TWO) TIMES DAILY. 09/03/16   Sueanne Margarita, MD  potassium chloride SA (KLOR-CON M20) 20 MEQ tablet TAKE 1 TABLET (20 MEQ TOTAL) BY MOUTH DAILY. 04/17/16   Sueanne Margarita, MD  rosuvastatin (CRESTOR) 40 MG tablet Take 1 tablet by mouth daily. 03/04/16   Historical Provider, MD    Family History Family History  Problem Relation Age of Onset  . Diabetes Mother   . Hyperlipidemia Mother   . Hypertension Mother   . Heart attack Mother   . Heart disease Mother     before age 61  . Diabetes Brother   . Heart disease Father     before age 53  .  Breast cancer Sister   . Diabetes Sister   . Hyperlipidemia Sister   . Hypertension Sister   . Heart attack Sister   . Hyperlipidemia Sister   . Hypertension Sister   . Heart disease Sister     Heart Disease before age 32  . Asthma Sister   . Malignant hyperthermia Neg Hx     Social History Social History  Substance Use Topics  . Smoking status: Former Smoker    Packs/day: 0.50    Years: 15.00    Types: Cigarettes, Cigars    Quit date: 10/15/1963  . Smokeless tobacco: Never Used  . Alcohol use No     Allergies   Clopidogrel bisulfate   Review of Systems Review of Systems  Constitutional: Negative for fever.  Respiratory: Positive for cough, chest tightness and shortness of breath.   Cardiovascular: Positive for leg swelling.  Gastrointestinal: Negative for abdominal pain.  Neurological: Negative for headaches.  All other systems reviewed and are negative.    Physical Exam Updated Vital Signs BP 191/94 (BP Location: Right Arm)   Pulse (!) 123   Temp 101.4 F (38.6 C) (Oral)   Resp (!) 30   SpO2 98%   Physical Exam  Constitutional: He is oriented to person, place, and time. He appears  well-developed and well-nourished. No distress.  HENT:  Head: Normocephalic and atraumatic.  Right Ear: External ear normal.  Left Ear: External ear normal.  Nose: Nose normal.  Eyes: Right eye exhibits no discharge. Left eye exhibits no discharge.  Neck: Neck supple.  Cardiovascular: Regular rhythm and normal heart sounds.  Tachycardia present.   Pulmonary/Chest: Effort normal. He has wheezes (diffuse, expiratory).  Abdominal: Soft. There is no tenderness.  Musculoskeletal: He exhibits edema (2+ bilateral pitting edema to lower extremities).  Neurological: He is alert and oriented to person, place, and time.  Skin: Skin is warm and dry. He is not diaphoretic.  Nursing note and vitals reviewed.    ED Treatments / Results  Labs (all labs ordered are listed, but only abnormal results are displayed) Labs Reviewed  CULTURE, BLOOD (ROUTINE X 2)  CULTURE, BLOOD (ROUTINE X 2)  URINE CULTURE  COMPREHENSIVE METABOLIC PANEL  CBC WITH DIFFERENTIAL/PLATELET  URINALYSIS, ROUTINE W REFLEX MICROSCOPIC  TROPONIN I  BRAIN NATRIURETIC PEPTIDE  I-STAT CG4 LACTIC ACID, ED    EKG  EKG Interpretation  Date/Time:  Sunday September 22 2016 18:12:13 EST Ventricular Rate:  129 PR Interval:    QRS Duration: 92 QT Interval:  301 QTC Calculation: 441 R Axis:   -66 Text Interpretation:  Sinus tachycardia Ventricular premature complex LAD, consider left anterior fascicular block Borderline low voltage, extremity leads Abnormal R-wave progression, late transition rate is faster, otherwise no significant change since 2016 Confirmed by Najah Liverman MD, Aerie Donica [phone]) on 09/22/2016 6:22:50 PM       Radiology Dg Chest Port 1 View  Result Date: 09/22/2016 CLINICAL DATA:  Patient from home, c/o increased SOB since last night. Hx COPD, asthma, CHF, CAD, Diabetes, HTN. Used inhaler at home with no relief. Former smoker. EXAM: PORTABLE CHEST 1 VIEW COMPARISON:  11/17/2014 chest CT FINDINGS: Thoracic  spondylosis. Upper normal heart size. Densely calcified left mid lung pulmonary nodule, stable. Airway thickening is present, suggesting bronchitis or reactive airways disease. Mildly prominent main pulmonary arterial contour. No airspace opacity is identified. IMPRESSION: 1. Airway thickening is present, suggesting bronchitis or reactive airways disease. 2. Thoracic spondylosis. 3. Mildly prominent main pulmonary artery could reflect pulmonary arterial hypertension.  Electronically Signed   By: Van Clines M.D.   On: 09/22/2016 18:56    Procedures Procedures (including critical care time)  Medications Ordered in ED Medications  cefTRIAXone (ROCEPHIN) 1 g in dextrose 5 % 50 mL IVPB (not administered)  azithromycin (ZITHROMAX) 500 mg in dextrose 5 % 250 mL IVPB (not administered)  acetaminophen (TYLENOL) tablet 1,000 mg (not administered)  cefTRIAXone (ROCEPHIN) 1 g in dextrose 5 % 50 mL IVPB (not administered)  azithromycin (ZITHROMAX) 500 mg in dextrose 5 % 250 mL IVPB (not administered)     Initial Impression / Assessment and Plan / ED Course  I have reviewed the triage vital signs and the nursing notes.  Pertinent labs & imaging results that were available during my care of the patient were reviewed by me and considered in my medical decision making (see chart for details).  Clinical Course as of Sep 22 1824  Nancy Fetter Sep 22, 2016  1805 Patient has diffuse wheezes, bronchitis vs edema. Given his peripheral edema I have concern for CHF. However with fever I will cover for pulmonary infection, check labs, lactate, urine.  [SG]    Clinical Course User Index [SG] Sherwood Gambler, MD    Patient appears better but still wheezing and still some increased WOB. Given age, comorbidities, will admit for continued respiratory support. No obvious PNA, but he was given IV antibiotics shortly after arrival given SIRS with likely respiratory source. HR improved. Lactate normal and no  hyoptension  Final Clinical Impressions(s) / ED Diagnoses   Final diagnoses:  COPD exacerbation ([place])    New Prescriptions New Prescriptions   No medications on file     Sherwood Gambler, MD 09/23/16 1017

## 2016-09-22 NOTE — ED Notes (Signed)
WILL TRANSPORT PT TO 4W 1422-1. AAOX4. PT IN NO APPARENT DISTRESS OR PAIN. THE OPPORTUNITY TO ASK QUESTIONS WAS PROVIDED.

## 2016-09-23 ENCOUNTER — Observation Stay (HOSPITAL_BASED_OUTPATIENT_CLINIC_OR_DEPARTMENT_OTHER): Payer: Commercial Managed Care - HMO

## 2016-09-23 ENCOUNTER — Encounter (HOSPITAL_COMMUNITY): Payer: Self-pay

## 2016-09-23 DIAGNOSIS — I509 Heart failure, unspecified: Secondary | ICD-10-CM | POA: Diagnosis not present

## 2016-09-23 DIAGNOSIS — D72829 Elevated white blood cell count, unspecified: Secondary | ICD-10-CM | POA: Diagnosis present

## 2016-09-23 DIAGNOSIS — J209 Acute bronchitis, unspecified: Secondary | ICD-10-CM | POA: Diagnosis present

## 2016-09-23 DIAGNOSIS — D696 Thrombocytopenia, unspecified: Secondary | ICD-10-CM | POA: Diagnosis present

## 2016-09-23 DIAGNOSIS — E119 Type 2 diabetes mellitus without complications: Secondary | ICD-10-CM | POA: Diagnosis not present

## 2016-09-23 DIAGNOSIS — Z6836 Body mass index (BMI) 36.0-36.9, adult: Secondary | ICD-10-CM | POA: Diagnosis not present

## 2016-09-23 DIAGNOSIS — J440 Chronic obstructive pulmonary disease with acute lower respiratory infection: Secondary | ICD-10-CM | POA: Diagnosis present

## 2016-09-23 DIAGNOSIS — T380X5A Adverse effect of glucocorticoids and synthetic analogues, initial encounter: Secondary | ICD-10-CM | POA: Diagnosis present

## 2016-09-23 DIAGNOSIS — I5030 Unspecified diastolic (congestive) heart failure: Secondary | ICD-10-CM | POA: Diagnosis not present

## 2016-09-23 DIAGNOSIS — J441 Chronic obstructive pulmonary disease with (acute) exacerbation: Secondary | ICD-10-CM | POA: Diagnosis present

## 2016-09-23 DIAGNOSIS — I2510 Atherosclerotic heart disease of native coronary artery without angina pectoris: Secondary | ICD-10-CM | POA: Diagnosis present

## 2016-09-23 DIAGNOSIS — E1151 Type 2 diabetes mellitus with diabetic peripheral angiopathy without gangrene: Secondary | ICD-10-CM | POA: Diagnosis present

## 2016-09-23 DIAGNOSIS — A419 Sepsis, unspecified organism: Secondary | ICD-10-CM | POA: Diagnosis present

## 2016-09-23 DIAGNOSIS — A4189 Other specified sepsis: Secondary | ICD-10-CM | POA: Diagnosis not present

## 2016-09-23 DIAGNOSIS — F419 Anxiety disorder, unspecified: Secondary | ICD-10-CM | POA: Diagnosis present

## 2016-09-23 DIAGNOSIS — E039 Hypothyroidism, unspecified: Secondary | ICD-10-CM | POA: Diagnosis present

## 2016-09-23 DIAGNOSIS — R6510 Systemic inflammatory response syndrome (SIRS) of non-infectious origin without acute organ dysfunction: Secondary | ICD-10-CM | POA: Diagnosis not present

## 2016-09-23 DIAGNOSIS — I5032 Chronic diastolic (congestive) heart failure: Secondary | ICD-10-CM | POA: Diagnosis present

## 2016-09-23 DIAGNOSIS — E7800 Pure hypercholesterolemia, unspecified: Secondary | ICD-10-CM | POA: Diagnosis present

## 2016-09-23 DIAGNOSIS — I110 Hypertensive heart disease with heart failure: Secondary | ICD-10-CM | POA: Diagnosis present

## 2016-09-23 DIAGNOSIS — E669 Obesity, unspecified: Secondary | ICD-10-CM | POA: Diagnosis present

## 2016-09-23 DIAGNOSIS — G4733 Obstructive sleep apnea (adult) (pediatric): Secondary | ICD-10-CM | POA: Diagnosis present

## 2016-09-23 DIAGNOSIS — K219 Gastro-esophageal reflux disease without esophagitis: Secondary | ICD-10-CM | POA: Diagnosis present

## 2016-09-23 DIAGNOSIS — R0602 Shortness of breath: Secondary | ICD-10-CM | POA: Diagnosis present

## 2016-09-23 LAB — ECHOCARDIOGRAM COMPLETE
CHL CUP MV DEC (S): 204
CHL CUP TV REG PEAK VELOCITY: 261 cm/s
E/e' ratio: 10.03
EWDT: 204 ms
FS: 40 % (ref 28–44)
Height: 64 in
IV/PV OW: 0.86
LADIAMINDEX: 1.29 cm/m2
LASIZE: 26 mm
LAVOL: 46 mL
LAVOLA4C: 42.2 mL
LAVOLIN: 22.9 mL/m2
LDCA: 3.14 cm2
LEFT ATRIUM END SYS DIAM: 26 mm
LV E/e' medial: 10.03
LV E/e'average: 10.03
LV PW d: 12.9 mm — AB (ref 0.6–1.1)
LV e' LATERAL: 7.4 cm/s
LVOT SV: 73 mL
LVOT VTI: 23.2 cm
LVOT peak vel: 100 cm/s
LVOTD: 20 mm
MV Peak grad: 2 mmHg
MV pk E vel: 74.2 m/s
MVPKAVEL: 85.9 m/s
TDI e' lateral: 7.4
TDI e' medial: 5.98
TRMAXVEL: 261 cm/s
Weight: 3393.6 oz

## 2016-09-23 LAB — GLUCOSE, CAPILLARY
GLUCOSE-CAPILLARY: 272 mg/dL — AB (ref 65–99)
GLUCOSE-CAPILLARY: 236 mg/dL — AB (ref 65–99)
GLUCOSE-CAPILLARY: 158 mg/dL — AB (ref 65–99)
GLUCOSE-CAPILLARY: 217 mg/dL — AB (ref 65–99)

## 2016-09-23 LAB — BASIC METABOLIC PANEL
ANION GAP: 9 (ref 5–15)
Anion gap: 10 (ref 5–15)
BUN: 22 mg/dL — ABNORMAL HIGH (ref 6–20)
BUN: 26 mg/dL — AB (ref 6–20)
CALCIUM: 9.4 mg/dL (ref 8.9–10.3)
CHLORIDE: 96 mmol/L — AB (ref 101–111)
CO2: 31 mmol/L (ref 22–32)
CO2: 29 mmol/L (ref 22–32)
CREATININE: 1.72 mg/dL — AB (ref 0.61–1.24)
Calcium: 9.2 mg/dL (ref 8.9–10.3)
Chloride: 94 mmol/L — ABNORMAL LOW (ref 101–111)
Creatinine, Ser: 1.84 mg/dL — ABNORMAL HIGH (ref 0.61–1.24)
GFR calc Af Amer: 37 mL/min — ABNORMAL LOW (ref >60)
GFR calc non Af Amer: 35 mL/min — ABNORMAL LOW (ref >60)
GFR, EST AFRICAN AMERICAN: 40 mL/min — AB (ref >60)
GFR, EST NON AFRICAN AMERICAN: 32 mL/min — AB (ref >60)
GLUCOSE: 319 mg/dL — AB (ref 65–99)
Glucose, Bld: 272 mg/dL — ABNORMAL HIGH (ref 65–99)
POTASSIUM: 5.3 mmol/L — AB (ref 3.5–5.1)
Potassium: 6 mmol/L — ABNORMAL HIGH (ref 3.5–5.1)
SODIUM: 134 mmol/L — AB (ref 135–145)
Sodium: 135 mmol/L (ref 135–145)

## 2016-09-23 LAB — CBC
HEMATOCRIT: 42.8 % (ref 39.0–52.0)
Hemoglobin: 14 g/dL (ref 13.0–17.0)
MCH: 29 pg (ref 26.0–34.0)
MCHC: 32.7 g/dL (ref 30.0–36.0)
MCV: 88.6 fL (ref 78.0–100.0)
Platelets: 144 10*3/uL — ABNORMAL LOW (ref 150–400)
RBC: 4.83 MIL/uL (ref 4.22–5.81)
RDW: 14.1 % (ref 11.5–15.5)
WBC: 11.8 10*3/uL — ABNORMAL HIGH (ref 4.0–10.5)

## 2016-09-23 LAB — STREP PNEUMONIAE URINARY ANTIGEN: STREP PNEUMO URINARY ANTIGEN: NEGATIVE

## 2016-09-23 LAB — T4, FREE: FREE T4: 0.46 ng/dL — AB (ref 0.61–1.12)

## 2016-09-23 MED ORDER — BUDESONIDE 0.5 MG/2ML IN SUSP
0.5000 mg | Freq: Two times a day (BID) | RESPIRATORY_TRACT | Status: DC
  Administered 2016-09-23 – 2016-09-24 (×3): 0.5 mg via RESPIRATORY_TRACT
  Filled 2016-09-23 (×3): qty 2

## 2016-09-23 MED ORDER — ARFORMOTEROL TARTRATE 15 MCG/2ML IN NEBU
15.0000 ug | INHALATION_SOLUTION | Freq: Two times a day (BID) | RESPIRATORY_TRACT | Status: DC
  Administered 2016-09-23 – 2016-09-24 (×3): 15 ug via RESPIRATORY_TRACT
  Filled 2016-09-23 (×3): qty 2

## 2016-09-23 NOTE — Progress Notes (Signed)
Pt has refused CPAP for the night.  Pt to notify RT if he changes his mind.  RT to monitor and assess as needed.  

## 2016-09-23 NOTE — Progress Notes (Addendum)
Inpatient Diabetes Program Recommendations  AACE/ADA: New Consensus Statement on Inpatient Glycemic Control (2015)  Target Ranges:  Prepandial:   less than 140 mg/dL      Peak postprandial:   less than 180 mg/dL (1-2 hours)      Critically ill patients:  140 - 180 mg/dL   Lab Results  Component Value Date   GLUCAP 272 (H) 09/23/2016   HGBA1C 6.6 (H) 11/16/2014   Results for ADAMA, ADDIS (MRN [id number]) as of 09/23/2016 10:30  Ref. Range 09/22/2016 22:17 09/23/2016 07:32  Glucose-Capillary Latest Ref Range: 65 - 99 mg/dL 188 (H) 272 (H)   HGB A1C = 8.3 in September, 2017 per scanned document from PCP. This is an average CBG of 218 mg/dL  Review of Glycemic Control  Diabetes history:       DM2, steroids this admission, Elevated BUN and Creatinine, Obesity, Elderly Outpatient Diabetes medications:       Amaryl 2 mg QHS Current orders for Inpatient glycemic control:       Novolog 0-9 units TIDAC and 0-5 units QHS,       Steroids (prednisine 60 mg QAM)  Inpatient Diabetes Program Recommendations:      If patient is continued on steroids, may want to consider meal coverage of Novolog 3 units TIDAC if patient eats > 50% of meal.      Please consider Levemir 10 units daily starting now (96.2 Kg x 0.1 units/Kg).  Note: Saw patient at bedside.  Spoke with patient regarding self-management of diabetes  1.  Taught patient the following (teach back and/or return demonstration):  Medications was taking at home and now in hospital (what these are, why taking, when taking, how taking, common S.E.'s)  What CBG monitoring and A1C's are and why these are improtant  How/why to check feet every day  Why exercise is important   Carb modified diet  2.  Identified barriers and facilitators to self-management goals:  Barriers: Unable to state which home medications are for diabetes (states takes 9 pills per day), widowed  Facilitators: Baseline understanding of why DM management is  important  3.  Identified support systems:  2 daughters  Thank you,  Windy Carina, RN, BSN Diabetes Coordinator Inpatient Diabetes Program [phone] (Team Pager)

## 2016-09-23 NOTE — Progress Notes (Signed)
  Echocardiogram 2D Echocardiogram has been performed.  Tresa Res 09/23/2016, 2:43 PM

## 2016-09-23 NOTE — Progress Notes (Signed)
Pt refuse CPAP for the night. When encouraged to use, patient still refuse. Patient Daughter at bedside.

## 2016-09-23 NOTE — Progress Notes (Signed)
PROGRESS NOTE    [name]  [id number] DOB: Nov 08, 1929 DOA: 09/22/2016 PCP: [name], MD    Brief Narrative:  82 y.o. male with medical history significant of HTN, HLD, DM type II, COPD, CAD s/p PCI, dCHF, PVD, OSA on CPAP; who presents with complaints of progressively worsening shortness of breath and malaise. History is obtained by the patient and 2 daughters present at bedside. Symptoms have been progressively worsening over the last couple of weeks most noticeable within the last week. Normally patients only on oxygen at night He had reported complaints of chest tightness and intermittent nonproductive cough. Using his albuterol at home provided only minimal relief. Patient tried taking Alka-Seltzer plus and Mucinex without relief of symptoms. 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 Patient notes that his weight has been going up was approximately 212 pounds proximally 2 weeks ago. He admits that he intermittently misses medication doses, but states that he is fairly consistent. Associated symptoms included generalized weakness, abdominal distention, and decreased appetite. Denies any significant fever, nausea, vomiting, diarrhea, or falls. Patient gives < 20 smoking pack year history.  Assessment & Plan:   Principal Problem:   SIRS (systemic inflammatory response syndrome) (HCC) Active Problems:   HYPERTENSION, BENIGN   WEIGHT GAIN, ABNORMAL   Coronary artery disease   OSA (obstructive sleep apnea)   Chronic diastolic CHF (congestive heart failure) (HCC)   COPD exacerbation (HCC)   HLD (hyperlipidemia)   Sepsis secondary to acute bronchitis, present on admit: Patient presents febrile up to 101.21F, tachycardic, tachypneic, WBC 12, and lactate acid just 1.67.CXR signs of airway thickening suggestive of bronchitis versus reactive airway disease.  Urinalysis negative. - Patient is continued on empiric antibiotics of ceftriaxone and azithromycin - Repeat CBC in AM - Presently afebrile  Bronchitis/question of COPD exacerbation :  - Patient given 125 mg of Solu-Medrol IV in the ED. - Will continue DuoNeb's TID and prn SOB /wheezing - patient is continued on Budesonide and Brovana nebs  - decreased BS with end-expiratory wheezing on exam - Currently remains on minimal O2 support  Diastolic congestive heart failure:  - continue with strict I&Os   - Patient was given one dose of IV lasix. Labs reviewed. Follow up Cr elevated at 1.8 - 2d echo ordered at time of admission - For now, will advise patient to elevate LE when at rest  Diabetes mellitus type 2: Patient reports only being on - Amaryl remains on hold - Continue pt on SSI coverage  Essential hypertension - BP remains stable this AM - Continue metoprolol and isosorbide mononitrate  Hyperlipidemia - Continue Crestor - Appears to be stable  Thrombocytopenia: Chronic.  - Patient with intermittent mildly Low platelet counts noted dating back to 2015. - Stable at present - Will follow CBC in AM  CAD s/p PCI - Stable at present without chest pain - monitor  Hypothyroidism:  - Question about patient's compliance to regimen - Patient reports weight gain and there is some note of intermittent compliance with medication. - TSH is elevated at just under 11 - ordered Free T4, reviewed and is low - Continue levothyroxine as already ordered  Peripheral vascular disease  - Appears to be stable at present  OSA:  - Patient reports using CPAP at night - Patient later states being frequently noncompliant with CPAP prior to admission - It is  evident patient is not aware of purpose of CPAP. Educated patient during bedside rounds in presence of multidisciplinary team. Patient states he understands need for cpap and will attempt to be more compliant  GERD - Continue  Protonix - Stable at present  DVT prophylaxis: Lovenox subQ Code Status: Full Family Communication: Pt in room Disposition Plan: Uncertain at this time  Consultants:     Procedures:     Antimicrobials: Anti-infectives    Start     Dose/Rate Route Frequency Ordered Stop   09/23/16 2000  azithromycin (ZITHROMAX) 500 mg in dextrose 5 % 250 mL IVPB     500 mg 250 mL/hr over 60 Minutes Intravenous Every 24 hours 09/22/16 1823     09/23/16 1800  cefTRIAXone (ROCEPHIN) 1 g in dextrose 5 % 50 mL IVPB     1 g 100 mL/hr over 30 Minutes Intravenous Every 24 hours 09/22/16 1823     09/22/16 1815  cefTRIAXone (ROCEPHIN) 1 g in dextrose 5 % 50 mL IVPB     1 g 100 mL/hr over 30 Minutes Intravenous  Once 09/22/16 1807 09/22/16 2035   09/22/16 1815  azithromycin (ZITHROMAX) 500 mg in dextrose 5 % 250 mL IVPB     500 mg 250 mL/hr over 60 Minutes Intravenous  Once 09/22/16 1807 09/22/16 2158       Subjective: Reports feeling better today  Objective: Vitals:   09/22/16 2238 09/22/16 2304 09/23/16 0617 09/23/16 0907  BP: 142/71 125/75 134/69   Pulse: 104 (!) 103 88   Resp:  20 20   Temp:  98.9 F (37.2 C) 98.4 F (36.9 C)   TempSrc:  Oral Oral   SpO2: 95% 95% 100% 97%  Weight:  96.2 kg (212 lb 1.6 oz)    Height:  5\' 4"  (1.626 m)      Intake/Output Summary (Last 24 hours) at 09/23/16 1426 Last data filed at 09/22/16 2300  Gross per 24 hour  Intake              300 ml  Output              300 ml  Net                0 ml   Filed Weights   09/22/16 1944 09/22/16 2304  Weight: 95.3 kg (210 lb) 96.2 kg (212 lb 1.6 oz)    Examination:  General exam: Appears calm and comfortable  Respiratory system: decreased BS throughout, end-expiratory wheezing Cardiovascular system: S1 & S2 heard, RRR Gastrointestinal system: Abdomen is nondistended, soft and nontender. No organomegaly or masses felt. Normal bowel sounds heard. Central nervous system: Alert and oriented. No focal  neurological deficits. Extremities: Symmetric 5 x 5 power. Skin: No rashes, lesions Psychiatry: Judgement and insight appear normal. Mood & affect appropriate.   Data Reviewed: I have personally reviewed following labs and imaging studies  CBC:  Recent Labs Lab 09/22/16 1853 09/23/16 0442  WBC 12.0* 11.8*  NEUTROABS 8.8*  --   HGB 14.4 14.0  HCT 45.2 42.8  MCV 91.5 88.6  PLT 132* 123456*   Basic Metabolic Panel:  Recent Labs Lab 09/22/16 1853 09/23/16 0442 09/23/16 0804  NA 137 135 134*  K 4.1 6.0* 5.3*  CL 97* 94* 96*  CO2 32 31 29  GLUCOSE 163* 319* 272*  BUN 16 22* 26*  CREATININE 1.22 1.72* 1.84*  CALCIUM 9.5 9.4 9.2   GFR: Estimated Creatinine Clearance: 31.3 mL/min (by C-G formula based  on SCr of 1.84 mg/dL (H)). Liver Function Tests:  Recent Labs Lab 09/22/16 1853  AST 34  ALT 23  ALKPHOS 47  BILITOT 0.9  PROT 8.1  ALBUMIN 4.9   No results for input(s): LIPASE, AMYLASE in the last 168 hours. No results for input(s): AMMONIA in the last 168 hours. Coagulation Profile: No results for input(s): INR, PROTIME in the last 168 hours. Cardiac Enzymes:  Recent Labs Lab 09/22/16 1853  TROPONINI <0.03   BNP (last 3 results) No results for input(s): PROBNP in the last 8760 hours. HbA1C: No results for input(s): HGBA1C in the last 72 hours. CBG:  Recent Labs Lab 09/22/16 2217 09/23/16 0732 09/23/16 1207  GLUCAP 188* 272* 236*   Lipid Profile: No results for input(s): CHOL, HDL, LDLCALC, TRIG, CHOLHDL, LDLDIRECT in the last 72 hours. Thyroid Function Tests:  Recent Labs  09/22/16 1854 09/23/16 0804  TSH 10.936*  --   FREET4  --  0.46*   Anemia Panel: No results for input(s): VITAMINB12, FOLATE, FERRITIN, TIBC, IRON, RETICCTPCT in the last 72 hours. Sepsis Labs:  Recent Labs Lab 09/22/16 1903 09/22/16 2226 09/22/16 2310  PROCALCITON  --   --  0.13  LATICACIDVEN 1.67 1.26  --     No results found for this or any previous visit  (from the past 240 hour(s)).   Radiology Studies: Dg Chest Port 1 View  Result Date: 09/22/2016 CLINICAL DATA:  Patient from home, c/o increased SOB since last night. Hx COPD, asthma, CHF, CAD, Diabetes, HTN. Used inhaler at home with no relief. Former smoker. EXAM: PORTABLE CHEST 1 VIEW COMPARISON:  11/17/2014 chest CT FINDINGS: Thoracic spondylosis. Upper normal heart size. Densely calcified left mid lung pulmonary nodule, stable. Airway thickening is present, suggesting bronchitis or reactive airways disease. Mildly prominent main pulmonary arterial contour. No airspace opacity is identified. IMPRESSION: 1. Airway thickening is present, suggesting bronchitis or reactive airways disease. 2. Thoracic spondylosis. 3. Mildly prominent main pulmonary artery could reflect pulmonary arterial hypertension. Electronically Signed   By: Van Clines M.D.   On: 09/22/2016 18:56    Scheduled Meds: . alfuzosin  10 mg Oral QHS  . arformoterol  15 mcg Nebulization BID  . aspirin EC  81 mg Oral QHS  . azithromycin  500 mg Intravenous Q24H  . budesonide (PULMICORT) nebulizer solution  0.5 mg Nebulization BID  . cefTRIAXone (ROCEPHIN)  IV  1 g Intravenous Q24H  . enoxaparin (LOVENOX) injection  40 mg Subcutaneous QHS  . guaiFENesin  600 mg Oral BID  . insulin aspart  0-5 Units Subcutaneous QHS  . insulin aspart  0-9 Units Subcutaneous TID WC  . ipratropium-albuterol  3 mL Nebulization TID  . isosorbide mononitrate  60 mg Oral QHS  . latanoprost  1 drop Both Eyes QHS  . levothyroxine  250 mcg Oral QAC breakfast  . metoprolol succinate  50 mg Oral QHS  . mirabegron ER  25 mg Oral QHS  . pantoprazole  80 mg Oral QHS  . predniSONE  60 mg Oral Q breakfast  . rosuvastatin  20 mg Oral QHS   Continuous Infusions:   LOS: 0 days   Breahna Boylen, Orpah Melter, MD [facility] Pager [phone]  If 7PM-7AM, please contact night-coverage [email or web address] Password [email or web address] 09/23/2016, 2:26 PM

## 2016-09-24 ENCOUNTER — Encounter (HOSPITAL_COMMUNITY): Payer: Self-pay

## 2016-09-24 LAB — GLUCOSE, CAPILLARY
GLUCOSE-CAPILLARY: 214 mg/dL — AB (ref 65–99)
Glucose-Capillary: 153 mg/dL — ABNORMAL HIGH (ref 65–99)
Glucose-Capillary: 189 mg/dL — ABNORMAL HIGH (ref 65–99)
Glucose-Capillary: 265 mg/dL — ABNORMAL HIGH (ref 65–99)

## 2016-09-24 LAB — BASIC METABOLIC PANEL
ANION GAP: 7 (ref 5–15)
BUN: 39 mg/dL — ABNORMAL HIGH (ref 6–20)
CALCIUM: 9 mg/dL (ref 8.9–10.3)
CO2: 32 mmol/L (ref 22–32)
Chloride: 97 mmol/L — ABNORMAL LOW (ref 101–111)
Creatinine, Ser: 1.4 mg/dL — ABNORMAL HIGH (ref 0.61–1.24)
GFR, EST AFRICAN AMERICAN: 52 mL/min — AB (ref >60)
GFR, EST NON AFRICAN AMERICAN: 45 mL/min — AB (ref >60)
Glucose, Bld: 123 mg/dL — ABNORMAL HIGH (ref 65–99)
Potassium: 4.8 mmol/L (ref 3.5–5.1)
Sodium: 136 mmol/L (ref 135–145)

## 2016-09-24 LAB — URINE CULTURE: Culture: NO GROWTH

## 2016-09-24 LAB — MAGNESIUM: MAGNESIUM: 1.9 mg/dL (ref 1.7–2.4)

## 2016-09-24 MED ORDER — IPRATROPIUM-ALBUTEROL 0.5-2.5 (3) MG/3ML IN SOLN
3.0000 mL | Freq: Four times a day (QID) | RESPIRATORY_TRACT | Status: DC
  Administered 2016-09-24 – 2016-09-25 (×5): 3 mL via RESPIRATORY_TRACT
  Filled 2016-09-24 (×5): qty 3

## 2016-09-24 MED ORDER — METHYLPREDNISOLONE SODIUM SUCC 40 MG IJ SOLR
40.0000 mg | Freq: Four times a day (QID) | INTRAMUSCULAR | Status: DC
  Administered 2016-09-24 – 2016-09-25 (×4): 40 mg via INTRAVENOUS
  Filled 2016-09-24 (×4): qty 1

## 2016-09-24 MED ORDER — IPRATROPIUM-ALBUTEROL 0.5-2.5 (3) MG/3ML IN SOLN
3.0000 mL | RESPIRATORY_TRACT | Status: DC | PRN
  Administered 2016-09-24: 3 mL via RESPIRATORY_TRACT
  Filled 2016-09-24: qty 3

## 2016-09-24 MED ORDER — TRAZODONE HCL 50 MG PO TABS
100.0000 mg | ORAL_TABLET | Freq: Every evening | ORAL | Status: DC | PRN
  Administered 2016-09-24: 100 mg via ORAL
  Filled 2016-09-24: qty 2

## 2016-09-24 NOTE — Progress Notes (Signed)
Pt refuse NIV for the night 

## 2016-09-24 NOTE — Progress Notes (Signed)
PRN neb given for Wheezing. RN notified RT of findings.

## 2016-09-24 NOTE — Progress Notes (Addendum)
PROGRESS NOTE    [name]  [id number] DOB: [birth year] DOA: 09/22/2016 PCP: [name], MD    Brief Narrative:  82 y.o. male with medical history significant of HTN, HLD, DM type II, COPD, CAD s/p PCI, dCHF, PVD, OSA on CPAP; who presents with complaints of progressively worsening shortness of breath and malaise. History is obtained by the patient and 2 daughters present at bedside. Symptoms have been progressively worsening over the last couple of weeks most noticeable within the last week. Normally patients only on oxygen at night He had reported complaints of chest tightness and intermittent nonproductive cough. Using his albuterol at home provided only minimal relief. Patient tried taking Alka-Seltzer plus and Mucinex without relief of symptoms. 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 Patient notes that his weight has been going up was approximately 212 pounds proximally 2 weeks ago. He admits that he intermittently misses medication doses, but states that he is fairly consistent. Associated symptoms included generalized weakness, abdominal distention, and decreased appetite. Denies any significant fever, nausea, vomiting, diarrhea, or falls. Patient gives < 20 smoking pack year history.  During this admission, patient was continued on empiric abx, nebs, and prednisone. Patient's wheezing continued and steroids were changed to IV solumedrol and neb frequency increased.   Assessment & Plan:   Principal Problem:   SIRS (systemic inflammatory response syndrome) (HCC) Active Problems:   HYPERTENSION, BENIGN   WEIGHT GAIN, ABNORMAL   Coronary artery disease   OSA (obstructive sleep apnea)   Chronic diastolic CHF (congestive heart failure) (HCC)   COPD exacerbation (HCC)   HLD (hyperlipidemia)   Sepsis secondary to acute bronchitis, present on admit:  Patient presents febrile up to 101.71F, tachycardic, tachypneic, WBC 12, and lactate acid just 1.67.CXR signs of airway thickening suggestive of bronchitis versus reactive airway disease. Urinalysis negative. - For now, patient remains on empiric antibiotics of ceftriaxone and azithromycin - Mild leukocytosis, likely related to steroids - Patient remains afebrile  Bronchitis/question of COPD exacerbation :  - Patient given 125 mg of Solu-Medrol IV in the ED. - Patient continues with marked wheezing and subjective sob - Will d/c PO prednisone and change to scheduled IV solumedrol - Will increase frequency of bronchodilators to q6hr scheduled from TID.  - At present, remains on min O2 support  Diastolic congestive heart failure:  - continue with strict I&Os   - Patient was given one dose of IV lasix. - 2d echo ordered at time of admission, reviewed with EF of 60%, unremarkable - Question venous stasis. Recommend LE elevation at rest and compression hoses  Diabetes mellitus type 2: Patient reports only being on - Amaryl remains on hold while admitted - Will continue patient on SSI coverage  Essential hypertension - BP remains stable this AM - For now, will continue metoprolol and isosorbide mononitrate  Hyperlipidemia - Patient is continued on Crestor - Remains stable  Thrombocytopenia: Chronic.  - Patient with intermittent mildly Low platelet counts noted dating back to 2015. - Stable at present - Will follow CBC in AM  CAD s/p PCI - Stable at present without chest pain - monitor  Hypothyroidism:  - Patient admits to frequently forgetting to take thyroid replacement prior to admission - TSH is elevated at just under 11 - ordered Free T4, reviewed and is low - Patient is tolerating thyroid replacement  Peripheral vascular  disease  - Remains stable at this time  OSA:  - Patient reports using CPAP at night - Reported to be noncompliant with CPAP while  admitted - Cessation done in presence of family. Patient states he will be compliant with CPAP while here.  GERD - Continue Protonix - Remains stable  DVT prophylaxis: Lovenox subQ Code Status: Full Family Communication: Pt in room Disposition Plan: Uncertain at this time  Consultants:     Procedures:     Antimicrobials: Anti-infectives    Start     Dose/Rate Route Frequency Ordered Stop   09/23/16 2000  azithromycin (ZITHROMAX) 500 mg in dextrose 5 % 250 mL IVPB     500 mg 250 mL/hr over 60 Minutes Intravenous Every 24 hours 09/22/16 1823     09/23/16 1800  cefTRIAXone (ROCEPHIN) 1 g in dextrose 5 % 50 mL IVPB     1 g 100 mL/hr over 30 Minutes Intravenous Every 24 hours 09/22/16 1823     09/22/16 1815  cefTRIAXone (ROCEPHIN) 1 g in dextrose 5 % 50 mL IVPB     1 g 100 mL/hr over 30 Minutes Intravenous  Once 09/22/16 1807 09/22/16 2035   09/22/16 1815  azithromycin (ZITHROMAX) 500 mg in dextrose 5 % 250 mL IVPB     500 mg 250 mL/hr over 60 Minutes Intravenous  Once 09/22/16 1807 09/22/16 2158      Subjective: Complains of continued wheezing  Objective: Vitals:   09/23/16 2053 09/24/16 0656 09/24/16 0857 09/24/16 0900  BP:  119/61    Pulse:  76    Resp:  20    Temp:  98.6 F (37 C)    TempSrc:  Oral    SpO2: 96% 92% 93% 93%  Weight:      Height:        Intake/Output Summary (Last 24 hours) at 09/24/16 1335 Last data filed at 09/23/16 1823  Gross per 24 hour  Intake              290 ml  Output              300 ml  Net              -10 ml   Filed Weights   09/22/16 1944 09/22/16 2304  Weight: 95.3 kg (210 lb) 96.2 kg (212 lb 1.6 oz)    Examination:  General exam: sitting in chair, in nad, conversant Respiratory system: normal resp effort, wheezing auscultated throughout, coarse Cardiovascular system: normal rate, s1, s2 Gastrointestinal system: soft, nondistended, pos bs Central nervous system: cn2-12 grossly intact, strength intact Extremities:  perfused, no clubbing Skin: normal skin turgor, no notable skin lesions seen Psychiatry: mood normal// no auditory hallucinations  Data Reviewed: I have personally reviewed following labs and imaging studies  CBC:  Recent Labs Lab 09/22/16 1853 09/23/16 0442  WBC 12.0* 11.8*  NEUTROABS 8.8*  --   HGB 14.4 14.0  HCT 45.2 42.8  MCV 91.5 88.6  PLT 132* [id number]*   Basic Metabolic Panel:  Recent Labs Lab 09/22/16 1853 09/23/16 0442 09/23/16 0804 09/24/16 0438  NA 137 135 134* 136  K 4.1 6.0* 5.3* 4.8  CL 97* 94* 96* 97*  CO2 32 31 29 32  GLUCOSE 163* 319* 272* 123*  BUN 16 22* 26* 39*  CREATININE 1.22 1.72* 1.84* 1.40*  CALCIUM 9.5 9.4 9.2 9.0  MG  --   --   --  1.9   GFR: Estimated Creatinine Clearance: 41.1 mL/min (  by C-G formula based on SCr of 1.4 mg/dL (H)). Liver Function Tests:  Recent Labs Lab 09/22/16 1853  AST 34  ALT 23  ALKPHOS 47  BILITOT 0.9  PROT 8.1  ALBUMIN 4.9   No results for input(s): LIPASE, AMYLASE in the last 168 hours. No results for input(s): AMMONIA in the last 168 hours. Coagulation Profile: No results for input(s): INR, PROTIME in the last 168 hours. Cardiac Enzymes:  Recent Labs Lab 09/22/16 1853  TROPONINI <0.03   BNP (last 3 results) No results for input(s): PROBNP in the last 8760 hours. HbA1C: No results for input(s): HGBA1C in the last 72 hours. CBG:  Recent Labs Lab 09/23/16 1207 09/23/16 1730 09/23/16 2252 09/24/16 0801 09/24/16 1128  GLUCAP 236* 158* 217* 153* 214*   Lipid Profile: No results for input(s): CHOL, HDL, LDLCALC, TRIG, CHOLHDL, LDLDIRECT in the last 72 hours. Thyroid Function Tests:  Recent Labs  09/22/16 1854 09/23/16 0804  TSH 10.936*  --   FREET4  --  0.46*   Anemia Panel: No results for input(s): VITAMINB12, FOLATE, FERRITIN, TIBC, IRON, RETICCTPCT in the last 72 hours. Sepsis Labs:  Recent Labs Lab 09/22/16 1903 09/22/16 2226 09/22/16 2310  PROCALCITON  --   --  0.13   LATICACIDVEN 1.67 1.26  --     Recent Results (from the past 240 hour(s))  Urine culture     Status: None   Collection Time: 09/22/16  6:07 PM  Result Value Ref Range Status   Specimen Description URINE, RANDOM  Final   Special Requests NONE  Final   Culture NO GROWTH Performed at [facility]   Final   Report Status 09/24/2016 FINAL  Final     Radiology Studies: Dg Chest Port 1 View  Result Date: 09/22/2016 CLINICAL DATA:  Patient from home, c/o increased SOB since last night. Hx COPD, asthma, CHF, CAD, Diabetes, HTN. Used inhaler at home with no relief. Former smoker. EXAM: PORTABLE CHEST 1 VIEW COMPARISON:  11/17/2014 chest CT FINDINGS: Thoracic spondylosis. Upper normal heart size. Densely calcified left mid lung pulmonary nodule, stable. Airway thickening is present, suggesting bronchitis or reactive airways disease. Mildly prominent main pulmonary arterial contour. No airspace opacity is identified. IMPRESSION: 1. Airway thickening is present, suggesting bronchitis or reactive airways disease. 2. Thoracic spondylosis. 3. Mildly prominent main pulmonary artery could reflect pulmonary arterial hypertension. Electronically Signed   By: Van Clines M.D.   On: 09/22/2016 18:56    Scheduled Meds: . alfuzosin  10 mg Oral QHS  . aspirin EC  81 mg Oral QHS  . azithromycin  500 mg Intravenous Q24H  . cefTRIAXone (ROCEPHIN)  IV  1 g Intravenous Q24H  . enoxaparin (LOVENOX) injection  40 mg Subcutaneous QHS  . guaiFENesin  600 mg Oral BID  . insulin aspart  0-5 Units Subcutaneous QHS  . insulin aspart  0-9 Units Subcutaneous TID WC  . ipratropium-albuterol  3 mL Nebulization Q6H  . isosorbide mononitrate  60 mg Oral QHS  . latanoprost  1 drop Both Eyes QHS  . levothyroxine  250 mcg Oral QAC breakfast  . methylPREDNISolone (SOLU-MEDROL) injection  40 mg Intravenous Q6H  . metoprolol succinate  50 mg Oral QHS  . mirabegron ER  25 mg Oral QHS  . pantoprazole  80 mg Oral  QHS  . rosuvastatin  20 mg Oral QHS   Continuous Infusions:   LOS: 1 day   Ralph Brouwer, Orpah Melter, MD [facility] Pager [phone]  If 7PM-7AM,  please contact night-coverage [email or web address] Password [email or web address] 09/24/2016, 1:35 PM

## 2016-09-25 DIAGNOSIS — K219 Gastro-esophageal reflux disease without esophagitis: Secondary | ICD-10-CM

## 2016-09-25 DIAGNOSIS — I5030 Unspecified diastolic (congestive) heart failure: Secondary | ICD-10-CM

## 2016-09-25 DIAGNOSIS — E119 Type 2 diabetes mellitus without complications: Secondary | ICD-10-CM

## 2016-09-25 DIAGNOSIS — E039 Hypothyroidism, unspecified: Secondary | ICD-10-CM

## 2016-09-25 DIAGNOSIS — I739 Peripheral vascular disease, unspecified: Secondary | ICD-10-CM

## 2016-09-25 DIAGNOSIS — D696 Thrombocytopenia, unspecified: Secondary | ICD-10-CM

## 2016-09-25 DIAGNOSIS — A4189 Other specified sepsis: Secondary | ICD-10-CM

## 2016-09-25 LAB — GLUCOSE, CAPILLARY
Glucose-Capillary: 215 mg/dL — ABNORMAL HIGH (ref 65–99)
Glucose-Capillary: 331 mg/dL — ABNORMAL HIGH (ref 65–99)

## 2016-09-25 LAB — BASIC METABOLIC PANEL
ANION GAP: 6 (ref 5–15)
BUN: 37 mg/dL — AB (ref 6–20)
CALCIUM: 9.3 mg/dL (ref 8.9–10.3)
CO2: 33 mmol/L — ABNORMAL HIGH (ref 22–32)
Chloride: 97 mmol/L — ABNORMAL LOW (ref 101–111)
Creatinine, Ser: 1.23 mg/dL (ref 0.61–1.24)
GFR calc Af Amer: >60 mL/min (ref >60)
GFR calc non Af Amer: 52 mL/min — ABNORMAL LOW (ref >60)
GLUCOSE: 200 mg/dL — AB (ref 65–99)
POTASSIUM: 4.8 mmol/L (ref 3.5–5.1)
Sodium: 136 mmol/L (ref 135–145)

## 2016-09-25 LAB — LEGIONELLA PNEUMOPHILA SEROGP 1 UR AG: L. PNEUMOPHILA SEROGP 1 UR AG: NEGATIVE

## 2016-09-25 MED ORDER — PREDNISONE 50 MG PO TABS
50.0000 mg | ORAL_TABLET | Freq: Every day | ORAL | 0 refills | Status: AC
Start: 2016-09-26 — End: 2016-09-27

## 2016-09-25 MED ORDER — DOXYCYCLINE MONOHYDRATE 100 MG PO CAPS: 100.0000 mg | ORAL_CAPSULE | Freq: Two times a day (BID) | ORAL | 0 refills | Status: AC

## 2016-09-25 MED ORDER — ALBUTEROL SULFATE (2.5 MG/3ML) 0.083% IN NEBU: 2.5000 mg | INHALATION_SOLUTION | RESPIRATORY_TRACT | 0 refills | Status: DC | PRN

## 2016-09-25 MED ORDER — DOXYCYCLINE HYCLATE 100 MG PO TABS: 100.0000 mg | ORAL_TABLET | Freq: Two times a day (BID) | ORAL | Status: DC

## 2016-09-25 MED ORDER — INSULIN ASPART 100 UNIT/ML ~~LOC~~ SOLN
0.0000 [IU] | Freq: Three times a day (TID) | SUBCUTANEOUS | Status: DC
  Administered 2016-09-25: 11 [IU] via SUBCUTANEOUS

## 2016-09-25 NOTE — Discharge Summary (Signed)
Physician Discharge Summary  [name] [id number] DOB: 12/15/1929 DOA: 09/22/2016  PCP: [name], MD  Admit date: 09/22/2016 Discharge date: 09/25/2016  Admitted From: Home Disposition: Home   Recommendations for Outpatient Follow-up:  1. Follow up with PCP in 1-2 weeks 2. Please obtain BMP/CBC in one week 3. Monitor oxygen requirement: Pt sent with home oxygen to be used as needed due to desaturations (to 87%) when ambulating. Doubt this will be chronically required.  4. Recheck TSH and free T4 in 6-8 weeks. TSH 10.9, thought to be related to incomplete medication adherence (also reports poor adherence to CPAP).   Home Health: None Equipment/Devices: 2 L O2, nebulizer  Discharge Condition: Stable CODE STATUS: Full Diet recommendation: Heart healthy.  Brief/Interim Summary: 82 y.o.malewith medical history significant of HTN, HLD, DM type II, COPD, CAD s/p PCI, dCHF, PVD, OSA on CPAP; who presents with complaints of progressively worsening shortness of breath and malaise. History is obtained by the patient and 2 daughters present at bedside. Symptoms have been progressively worsening over the last couple of weeks most noticeable within the last week. Normally patients only on oxygen at night He had reported complaints of chest tightness and intermittent nonproductive cough. Using his albuterol at home provided only minimal relief. Patient tried taking Alka-Seltzer plus and Mucinex without relief of symptoms. 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 Patient notes that his weight has been going up was approximately 212 pounds proximally 2 weeks ago. He admits that he intermittently missesmedication doses,but states that he is fairly consistent. Associated symptoms included generalized weakness, abdominal distention,and decreased appetite. Denies any  significant fever,nausea, vomiting, diarrhea, or falls.Patient gives <20 smokingpack year history.  During this admission, patient was continued on empiric abx, nebs, and prednisone. Patient's wheezing continued and steroids were changed to IV solumedrol and neb frequency increased with resolution of these symptoms. He reported feeling back to baseline, but required oxygen when ambulating, so was kept for an additional day. Unfortunately oxygen requirement remained despite clinical resolution. He strongly desired discharge, and was able to ambulate throughout the hospital ward without dyspnea, so was sent home on home oxygen.   Discharge Diagnoses:  Principal Problem:   SIRS (systemic inflammatory response syndrome) (HCC) Active Problems:   HYPERTENSION, BENIGN   WEIGHT GAIN, ABNORMAL   Coronary artery disease   OSA (obstructive sleep apnea)   Chronic diastolic CHF (congestive heart failure) (HCC)   COPD exacerbation (HCC)   HLD (hyperlipidemia)  Sepsis secondary to acute bronchitis, present on admit: Patient presents febrile up to 101.45F, tachycardic, tachypneic, WBC 12, and lactate acid just 1.67.CXR signs of airway thickening suggestive of bronchitis versus reactive airway disease. Urinalysis negative. - Empiric antibiotics of ceftriaxone and azithromycin transitioned to doxycycline for bronchitis (no infiltrate) - Mild leukocytosis, likely related to steroids: monitor at follow up. Has 1 more day of 5-day course of steroids. - Patient remains afebrile  Bronchitis/question of COPD exacerbation:  - Continue doxycycline - Prescribed nebulizer DME for albuterol 4 times per day for the next 2 days and as needed for shortness of breath or wheezing thereafter.  - Continue symbicort as PTA - At present, remains on min O2 support  Diastolic congestive heart failure:  - continue with strict I&Os  - Patient was given one dose of IV lasix. - 2d echo ordered at time of admission,  reviewed with  EF of 60%, unremarkable - Question venous stasis. Recommend LE elevation at rest and compression hose  Diabetes mellitus type 2: Patient reports only being on - Amaryl remains on hold while admitted - Will continue patient on SSI coverage  Essential hypertension - BP remains stable this AM - For now, will continue metoprolol andisosorbide mononitrate   Hyperlipidemia - Patient is continued on Crestor - Remains stable  Thrombocytopenia: Chronic.  - Patient with intermittent mildly Low platelet counts noted dating back to 2015. - Stable at present, no evidence of bleeding/bruising.   CAD s/p PCI - Stable at present without chest pain - monitor  Hypothyroidism:  - Patient admits to frequently forgetting to take thyroid replacement prior to admission - TSH is elevated at just under 11 - ordered Free T4, reviewed and is low - Patient is tolerating thyroid replacement  Peripheral vascular disease  - Remains stable at this time  OSA:  - Patient reports using CPAP at night - Reported to be noncompliant with CPAP while admitted - Cessation done in presence of family. Patient states he will be compliant with CPAP while here.  GERD - Continue Protonix - Remains stable  Discharge Instructions Discharge Instructions    (HEART FAILURE PATIENTS) Call MD:  Anytime you have any of the following symptoms: 1) 3 pound weight gain in 24 hours or 5 pounds in 1 week 2) shortness of breath, with or without a dry hacking cough 3) swelling in the hands, feet or stomach 4) if you have to sleep on extra pillows at night in order to breathe.    Complete by:  As directed    Call MD for:  difficulty breathing, headache or visual disturbances    Complete by:  As directed    Call MD for:  temperature >100.4    Complete by:  As directed    Diet - low sodium heart healthy    Complete by:  As directed    Discharge instructions    Complete by:  As directed    You were admitted  with acute bronchitis which has improved with steroids, breathing treatments and antibiotics. You are clear for discharge with the following recommendations:  - Keep taking doxycycline (the antibiotic) until you run out of pills. You have 7 more doses (taken twice daily) left.  - Use the nebulizer machine for albuterol 4 times per day for the next 2 days and as needed for shortness of breath or wheezing thereafter.  - Keep taking symbicort as you have been.  - Keep taking prednisone for 1 more day - take it tomorrow morning. - If you have worsening of your symptoms please seek medical care.   For home use only DME oxygen    Complete by:  As directed    Mode or (Route):  Nasal cannula   Liters per Minute:  2   Frequency:  Continuous (stationary and portable oxygen unit needed)   Oxygen delivery system:  Gas       Medication List    STOP taking these medications   potassium chloride SA 20 MEQ tablet Commonly known as:  K-DUR,KLOR-CON     TAKE these medications   albuterol (2.5 MG/3ML) 0.083% nebulizer solution Commonly known as:  PROVENTIL Take 3 mLs (2.5 mg total) by nebulization every 4 (four) hours as needed for wheezing or shortness of breath.   albuterol 108 (90 Base) MCG/ACT inhaler Commonly known as:  PROVENTIL HFA;VENTOLIN HFA Inhale 2 puffs into the lungs every 6 (  six) hours as needed for wheezing or shortness of breath.   alfuzosin 10 MG 24 hr tablet Commonly known as:  UROXATRAL Take 10 mg by mouth at bedtime.   APPLE CIDER VINEGAR PLUS PO Take 1 capsule by mouth at bedtime.   aspirin EC 81 MG tablet Take 81 mg by mouth at bedtime.   budesonide-formoterol 160-4.5 MCG/ACT inhaler Commonly known as:  SYMBICORT Inhale 2 puffs into the lungs 2 (two) times daily.   doxycycline 100 MG capsule Commonly known as:  MONODOX Take 1 capsule (100 mg total) by mouth 2 (two) times daily.   glimepiride 2 MG tablet Commonly known as:  AMARYL Take 2 mg by mouth at bedtime.    isosorbide mononitrate 60 MG 24 hr tablet Commonly known as:  IMDUR Take 60 mg by mouth at bedtime.   latanoprost 0.005 % ophthalmic solution Commonly known as:  XALATAN Place 1 drop into both eyes at bedtime.   levothyroxine 125 MCG tablet Commonly known as:  SYNTHROID, LEVOTHROID Take 250 mcg by mouth daily before breakfast.   loperamide 2 MG capsule Commonly known as:  IMODIUM Take 2 mg by mouth as needed for diarrhea or loose stools.   meloxicam 15 MG tablet Commonly known as:  MOBIC Take 15 mg by mouth at bedtime.   metoprolol succinate 50 MG 24 hr tablet Commonly known as:  TOPROL-XL Take 50 mg by mouth at bedtime. Take with or immediately following a meal.   MYRBETRIQ 25 MG Tb24 tablet Generic drug:  mirabegron ER Take 25 mg by mouth at bedtime.   nitroGLYCERIN 0.4 MG SL tablet Commonly known as:  NITROSTAT Place 0.4 mg under the tongue every 5 (five) minutes x 3 doses as needed for chest pain. For chest pain.   pantoprazole 40 MG tablet Commonly known as:  PROTONIX Take 80 mg by mouth at bedtime.   predniSONE 50 MG tablet Commonly known as:  DELTASONE Take 1 tablet (50 mg total) by mouth daily with breakfast. Start taking on:  09/26/2016   rosuvastatin 20 MG tablet Commonly known as:  CRESTOR Take 20 mg by mouth at bedtime.            Durable Medical Equipment        Start     Ordered   09/25/16 [phone]  For home use only DME Nebulizer machine  Once    Question:  Patient needs a nebulizer to treat with the following condition  Answer:  Acute bronchitis   09/25/16 0948   09/25/16 0000  For home use only DME oxygen    Question Answer Comment  Mode or (Route) Nasal cannula   Liters per Minute 2   Frequency Continuous (stationary and portable oxygen unit needed)   Oxygen delivery system Gas      09/25/16 1335      Allergies  Allergen Reactions  . Clopidogrel Itching    Consultations: None  Procedures/Studies: Dg Chest Port 1  View  Result Date: 09/22/2016 CLINICAL DATA:  Patient from home, c/o increased SOB since last night. Hx COPD, asthma, CHF, CAD, Diabetes, HTN. Used inhaler at home with no relief. Former smoker. EXAM: PORTABLE CHEST 1 VIEW COMPARISON:  11/17/2014 chest CT FINDINGS: Thoracic spondylosis. Upper normal heart size. Densely calcified left mid lung pulmonary nodule, stable. Airway thickening is present, suggesting bronchitis or reactive airways disease. Mildly prominent main pulmonary arterial contour. No airspace opacity is identified. IMPRESSION: 1. Airway thickening is present, suggesting bronchitis or reactive airways disease. 2.  Thoracic spondylosis. 3. Mildly prominent main pulmonary artery could reflect pulmonary arterial hypertension. Electronically Signed   By: Van Clines M.D.   On: 09/22/2016 18:56     Echocardiogram: Mild LVH, EF 123456, grade 1 diastolic dysfunction, normal estimated CVP. No significant change from 2016.  Subjective: Pt feels much better, ambulating in the hallways. Wheezing resolved. Wants to go home.   Discharge Exam: Vitals:   09/24/16 2134 09/25/16 0459  BP: 129/66 (!) 157/77  Pulse: 92 97  Resp: 18 20  Temp: 98 F (36.7 C) 98.1 F (36.7 C)   Vitals:   09/24/16 2205 09/25/16 0459 09/25/16 0819 09/25/16 0820  BP:  (!) 157/77    Pulse:  97    Resp:  20    Temp:  98.1 F (36.7 C)    TempSrc:  Oral    SpO2: 96% 94% 95% 95%  Weight:      Height:       General: Pt is alert, awake, not in acute distress Cardiovascular: RRR, S1/S2 +, no rubs, no gallops Respiratory: Nonlabored on room air at rest. Clear, diminished bilaterally, no wheezing, no rhonchi Abdominal: Soft, NT, ND, bowel sounds + Extremities: no edema, no cyanosis  The results of significant diagnostics from this hospitalization (including imaging, microbiology, ancillary and laboratory) are listed below for reference.    Microbiology: Recent Results (from the past 240 hour(s))  Urine  culture     Status: None   Collection Time: 09/22/16  6:07 PM  Result Value Ref Range Status   Specimen Description URINE, RANDOM  Final   Special Requests NONE  Final   Culture NO GROWTH Performed at [facility]   Final   Report Status 09/24/2016 FINAL  Final  Blood Culture (routine x 2)     Status: None (Preliminary result)   Collection Time: 09/22/16  6:54 PM  Result Value Ref Range Status   Specimen Description BLOOD BLOOD RIGHT FOREARM  Final   Special Requests BOTTLES DRAWN AEROBIC AND ANAEROBIC 5ML  Final   Culture   Final    NO GROWTH 1 DAY Performed at [facility]    Report Status PENDING  Incomplete  Blood Culture (routine x 2)     Status: None (Preliminary result)   Collection Time: 09/23/16  4:42 AM  Result Value Ref Range Status   Specimen Description BLOOD RIGHT ANTECUBITAL  Final   Special Requests IN PEDIATRIC BOTTLE 5CC  Final   Culture   Final    NO GROWTH 1 DAY Performed at [facility]    Report Status PENDING  Incomplete     Labs: BNP (last 3 results)  Recent Labs  09/22/16 1853  BNP 123456   Basic Metabolic Panel:  Recent Labs Lab 09/22/16 1853 09/23/16 0442 09/23/16 0804 09/24/16 0438 09/25/16 0458  NA 137 135 134* 136 136  K 4.1 6.0* 5.3* 4.8 4.8  CL 97* 94* 96* 97* 97*  CO2 32 31 29 32 33*  GLUCOSE 163* 319* 272* 123* 200*  BUN 16 22* 26* 39* 37*  CREATININE 1.22 1.72* 1.84* 1.40* 1.23  CALCIUM 9.5 9.4 9.2 9.0 9.3  MG  --   --   --  1.9  --    Liver Function Tests:  Recent Labs Lab 09/22/16 1853  AST 34  ALT 23  ALKPHOS 47  BILITOT 0.9  PROT 8.1  ALBUMIN 4.9   No results for input(s): LIPASE, AMYLASE in the last 168 hours. No results  for input(s): AMMONIA in the last 168 hours. CBC:  Recent Labs Lab 09/22/16 1853 09/23/16 0442  WBC 12.0* 11.8*  NEUTROABS 8.8*  --   HGB 14.4 14.0  HCT 45.2 42.8  MCV 91.5 88.6  PLT 132* 144*   Cardiac Enzymes:  Recent Labs Lab 09/22/16 1853  TROPONINI  <0.03   BNP: Invalid input(s): POCBNP CBG:  Recent Labs Lab 09/24/16 1128 09/24/16 1728 09/24/16 2131 09/25/16 0741 09/25/16 1130  GLUCAP 214* 189* 265* 215* 331*   D-Dimer No results for input(s): DDIMER in the last 72 hours. Hgb A1c No results for input(s): HGBA1C in the last 72 hours. Lipid Profile No results for input(s): CHOL, HDL, LDLCALC, TRIG, CHOLHDL, LDLDIRECT in the last 72 hours. Thyroid function studies  Recent Labs  09/22/16 1854  TSH 10.936*   Anemia work up No results for input(s): VITAMINB12, FOLATE, FERRITIN, TIBC, IRON, RETICCTPCT in the last 72 hours. Urinalysis    Component Value Date/Time   COLORURINE STRAW (A) 09/22/2016 1807   APPEARANCEUR CLEAR 09/22/2016 1807   LABSPEC 1.012 09/22/2016 1807   PHURINE 5.0 09/22/2016 1807   GLUCOSEU NEGATIVE 09/22/2016 1807   HGBUR MODERATE (A) 09/22/2016 1807   BILIRUBINUR NEGATIVE 09/22/2016 1807   KETONESUR NEGATIVE 09/22/2016 1807   PROTEINUR NEGATIVE 09/22/2016 1807   UROBILINOGEN 0.2 07/12/2014 1702   NITRITE NEGATIVE 09/22/2016 1807   LEUKOCYTESUR NEGATIVE 09/22/2016 1807   Sepsis Labs Invalid input(s): PROCALCITONIN,  WBC,  LACTICIDVEN Microbiology Recent Results (from the past 240 hour(s))  Urine culture     Status: None   Collection Time: 09/22/16  6:07 PM  Result Value Ref Range Status   Specimen Description URINE, RANDOM  Final   Special Requests NONE  Final   Culture NO GROWTH Performed at [facility]   Final   Report Status 09/24/2016 FINAL  Final  Blood Culture (routine x 2)     Status: None (Preliminary result)   Collection Time: 09/22/16  6:54 PM  Result Value Ref Range Status   Specimen Description BLOOD BLOOD RIGHT FOREARM  Final   Special Requests BOTTLES DRAWN AEROBIC AND ANAEROBIC 5ML  Final   Culture   Final    NO GROWTH 1 DAY Performed at [facility]    Report Status PENDING  Incomplete  Blood Culture (routine x 2)     Status: None (Preliminary result)    Collection Time: 09/23/16  4:42 AM  Result Value Ref Range Status   Specimen Description BLOOD RIGHT ANTECUBITAL  Final   Special Requests IN PEDIATRIC BOTTLE 5CC  Final   Culture   Final    NO GROWTH 1 DAY Performed at [facility]    Report Status PENDING  Incomplete    Time coordinating discharge: Over 28 minutes  Vance Gather, MD  [facility] 09/25/2016, 1:35 PM Pager [phone]  If 7PM-7AM, please contact night-coverage [email or web address] Password [email or web address]

## 2016-09-25 NOTE — Progress Notes (Signed)
Inpatient Diabetes Program Recommendations  AACE/ADA: New Consensus Statement on Inpatient Glycemic Control (2015)  Target Ranges:  Prepandial:   less than 140 mg/dL      Peak postprandial:   less than 180 mg/dL (1-2 hours)      Critically ill patients:  140 - 180 mg/dL   Results for ESTER, LINNEMAN (MRN [id number]) as of 09/25/2016 12:28  Ref. Range 09/24/2016 11:28 09/24/2016 17:28 09/24/2016 21:31 09/25/2016 07:41 09/25/2016 11:30  Glucose-Capillary Latest Ref Range: 65 - 99 mg/dL 214 (H) 189 (H) 265 (H) 215 (H) 331 (H)   Review of Glycemic Control  Diabetes history: DM 2 Outpatient Diabetes medications: Amaryl 2 mg  Current orders for Inpatient glycemic control: Novolog Moderate + HS scale  Inpatient Diabetes Program Recommendations:  Glucose in the 200-300 range. IV Solumedrol 40 mg Q6 hours ordered. If patient continues on steroid dose, please consider Levemir 10 units Q 24hours, and meal coverage of Novolog 3 units TIDAC if patient eats > 50% of meal.  Thanks,  Tama Headings RN, MSN, [facility] Inpatient Diabetes Coordinator Team Pager [phone] (8a-5p)

## 2016-09-28 LAB — CULTURE, BLOOD (ROUTINE X 2)
CULTURE: NO GROWTH
Culture: NO GROWTH

## 2016-09-30 DIAGNOSIS — E039 Hypothyroidism, unspecified: Secondary | ICD-10-CM | POA: Diagnosis not present

## 2016-09-30 DIAGNOSIS — J441 Chronic obstructive pulmonary disease with (acute) exacerbation: Secondary | ICD-10-CM | POA: Diagnosis not present

## 2016-09-30 DIAGNOSIS — D72829 Elevated white blood cell count, unspecified: Secondary | ICD-10-CM | POA: Diagnosis not present

## 2016-09-30 DIAGNOSIS — Z79899 Other long term (current) drug therapy: Secondary | ICD-10-CM | POA: Diagnosis not present

## 2016-09-30 DIAGNOSIS — Z9119 Patient's noncompliance with other medical treatment and regimen: Secondary | ICD-10-CM | POA: Diagnosis not present

## 2016-10-11 ENCOUNTER — Other Ambulatory Visit: Payer: Self-pay

## 2016-10-11 NOTE — Patient Outreach (Signed)
Transition of care new referral from [facility]: Discharge date of 09/25/2016.  This is a continuation of transition of care from [place] with [facility].   Placed call to patient who identified himself. Reports that he is doing well. States for me to speak with his daughter Sondra Barges.  Spoke with Mrs. Eulas Post who states that patient is doing well. Reports that he is using his oxygen with his CPAP at night. Reports patient has all medications and is taking as prescribed. Daughter states that she lives with patient. Daughter reports that patient has had his hospital follow up.    Plan: explained transition of care program. Schedule home visit for Wednesday January 3rd at 1pm. Confirmed address. Will send MD barrier letter.   THN CM Care Plan Problem One   Flowsheet Row Most Recent Value  Care Plan Problem One  Recent hospital admission for shortness of breath  Role Documenting the Problem One  Care Management [place] for Problem One  Active  THN Long Term Goal (31-90 days)  Patient will verbalize no readmission for shortness of breath in the next 60 days.   THN Long Term Goal Start Date  10/11/16  Interventions for Problem One Long Term Goal  Reviewed transiton of care program. Home visit scheduled. Provided my contact information and the 24 hour nurse line phone number.   THN CM Short Term Goal #1 (0-30 days)  Patient will report taking all medications as prescribed for the next 30 days.  THN CM Short Term Goal #1 Start Date  10/11/16  Interventions for Short Term Goal #1  Reviewed importance of taking all medications as prescribed.   THN CM Short Term Goal #2 (0-30 days)  Patient will verbalize understanding COPD zones and heart failure zones in the next 30 days.   THN CM Short Term Goal #2 Start Date  10/11/16  Interventions for Short Term Goal #2  Home visit planned and will provide printed information on zones. verbally reviewed zones via phone.       Tomasa Rand, RN, BSN, CEN [facility] [employer] [phone]

## 2016-10-16 ENCOUNTER — Other Ambulatory Visit: Payer: Self-pay

## 2016-10-16 NOTE — Patient Outreach (Signed)
Care Coordination: 2 incoming calls from Amy with primary MD office about patient.  Dr. Brigitte Pulse instructions are to start Lasix tonight for 2 doses and be seen in the morning at 11:30 am by primary MD.  Per Amy RX already sent to pharmacy.  Reviewed plan of care with patient and daughter.  Patient agrees to take medication tonight and be seen in the am.  Reviewed with patient and daughter; if patient worsens to call EMS and go to hospital.  Reviewed reasons to call MD. Increased SOB, chest pain dizziness.  Reviewed with patient the importance of weighing daily and avoiding salt.  Reminded patient to call MD if needed.  PLAN: Will folllow up with patient as planned in 1 week or sooner if needed.  Tomasa Rand, RN, BSN, CEN [facility] [employer] [phone]

## 2016-10-16 NOTE — Patient Outreach (Signed)
[facility]) Care Management   10/16/2016  [name] [birth year] [id number]  [name] is an 83 y.o. male   Subjective: Patient reports that he is doing well since discharge home from hospital. Daughter lives with patient and assist as needed.  Patient states that he only uses oxygen with his CPAP machine. Does not have a canula.  According to discharge instructions patient is able to wear oxygen as needed but does not.  Patient reports his legs are more swollen than when he got out of the hospital.  Reports increased shortness today.  Productive cough with clear sputum.   Denies weighing daily.  Report never been today to weigh daily. Reports using his nebulizer once a day.  Declines having a rescue inhaler.  Objective:  Legs swollen---left worse than right 2 plus edema.  Diminished breath sounds.  Vitals:   10/16/16 1318 10/16/16 1431  BP: (!) 142/86   Pulse: 86   SpO2: 92%   Weight: 210 lb (95.3 kg) 220 lb (99.8 kg)  Height: 1.626 m (5\' 4" )    Able to ambulated with any assistive devices. Appears winded when ambulating.  Review of Systems  Constitutional: Negative.   HENT:       Tickle in "my throat"  Eyes:       Wears glasses.   Respiratory: Positive for cough, sputum production, shortness of breath and wheezing.   Cardiovascular: Positive for leg swelling.  Gastrointestinal: Negative.   Genitourinary: Negative.   Musculoskeletal:       Occasional joint pain in his arms  Skin: Negative.   Neurological: Negative.   Endo/Heme/Allergies: Negative.   Psychiatric/Behavioral: Negative.     Physical Exam  Constitutional: He is oriented to person, place, and time. He appears well-developed and well-nourished.  Cardiovascular: Normal rate, normal heart sounds and intact distal pulses.   Respiratory: Effort normal.  Winded with movement. No wearing oxygen except at night with cpap machine.   Diminished breath sounds in lung bases.   GI: Soft. Bowel  sounds are normal.  Musculoskeletal: Normal range of motion. He exhibits edema.  2 plus edema to the right and left leg.  Left leg larger than right. Patient reports that his legs are more swollen now then when he was in the hospital  Neurological: He is alert and oriented to person, place, and time.  Skin: Skin is warm and dry.  Feet without lesions or broken skin  Psychiatric: He has a normal mood and affect. His behavior is normal. Judgment and thought content normal.    Encounter Medications:   Outpatient Encounter Prescriptions as of 10/16/2016  Medication Sig  . albuterol (PROVENTIL) (2.5 MG/3ML) 0.083% nebulizer solution Take 3 mLs (2.5 mg total) by nebulization every 4 (four) hours as needed for wheezing or shortness of breath.  . alfuzosin (UROXATRAL) 10 MG 24 hr tablet Take 10 mg by mouth at bedtime.  Marland Kitchen aspirin EC 81 MG tablet Take 81 mg by mouth at bedtime.   . Fluticasone-Umeclidin-Vilant 100-62.5-25 MCG/INH AEPB Inhale 1 Dose into the lungs daily.  Marland Kitchen glimepiride (AMARYL) 2 MG tablet Take 2 mg by mouth at bedtime.   . isosorbide mononitrate (IMDUR) 60 MG 24 hr tablet Take 60 mg by mouth at bedtime.  Marland Kitchen latanoprost (XALATAN) 0.005 % ophthalmic solution Place 1 drop into both eyes at bedtime.   Marland Kitchen levothyroxine (SYNTHROID, LEVOTHROID) 125 MCG tablet Take 250 mcg by mouth daily before breakfast.   . loperamide (IMODIUM) 2 MG capsule  Take 2 mg by mouth as needed for diarrhea or loose stools.  . meloxicam (MOBIC) 15 MG tablet Take 15 mg by mouth at bedtime.   . metoprolol succinate (TOPROL-XL) 50 MG 24 hr tablet Take 50 mg by mouth at bedtime. Take with or immediately following a meal.  . pantoprazole (PROTONIX) 40 MG tablet Take 40 mg by mouth 2 (two) times daily.   . rosuvastatin (CRESTOR) 20 MG tablet Take 20 mg by mouth at bedtime.  Marland Kitchen albuterol (PROVENTIL HFA;VENTOLIN HFA) 108 (90 BASE) MCG/ACT inhaler Inhale 2 puffs into the lungs every 6 (six) hours as needed for wheezing or  shortness of breath.   . Apple Cid Vn-Grn Tea-Bit Or-Cr (APPLE CIDER VINEGAR PLUS PO) Take 1 capsule by mouth at bedtime.   . budesonide-formoterol (SYMBICORT) 160-4.5 MCG/ACT inhaler Inhale 2 puffs into the lungs 2 (two) times daily.  . mirabegron ER (MYRBETRIQ) 25 MG TB24 tablet Take 25 mg by mouth at bedtime.  . nitroGLYCERIN (NITROSTAT) 0.4 MG SL tablet Place 0.4 mg under the tongue every 5 (five) minutes x 3 doses as needed for chest pain. For chest pain.   No facility-administered encounter medications on file as of 10/16/2016.     Functional Status:   In your present state of health, do you have any difficulty performing the following activities: 10/16/2016 09/22/2016  Hearing? N N  Vision? N N  Difficulty concentrating or making decisions? N N  Walking or climbing stairs? N N  Dressing or bathing? N N  Doing errands, shopping? N N  Preparing Food and eating ? N -  Using the Toilet? N -  In the past six months, have you accidently leaked urine? N -  Do you have problems with loss of bowel control? N -  Managing your Medications? N -  Managing your Finances? N -  Housekeeping or managing your Housekeeping? N -  Some recent data might be hidden    Fall/Depression Screening:    PHQ 2/9 Scores 10/16/2016  PHQ - 2 Score 0   Fall Risk  10/16/2016  Falls in the past year? No    Assessment:   (1) Reviewed [facility] program. Provided new [employer] patient packet. Reviewed consent and consent signed. Provided my contact information.  (2) CHF:  Denies weighing daily. Reports no added salt. Legs 2 plus edema. Reports worse now than when in hospital.  Current weight during home visit of 220 pounds. Patient states his last weight weeks ago was 210 pounds.  (3) DM:  Not monitoring CBG.  Uses his families CBG make 1-2 times per month. Not following DM diet.   Non fasting CBG reading today of 177.  (4) COPD:  Reports breathing is some better.  No rescue inhaler.  Unaware of COPD zones. Not using oxygen  except with CPAP machine. Denies having a nasal cannula.  Plan:  (1) consent scanned into chart,.  Encouraged patient to call if needed.  (2) Encouraged patient to weigh daily and record. Reviewed discharged instructions and importance of low salt diet and calling MD for weight gain greater than 3 pounds over night or 5 pounds in a week. (3) will inquire with MD about patient need to check CBG and how often.  Encouraged patient to avoid sweets and limit bread, pasta, potatoes and rice. (4)Reviewed importance of using nebulizer and inhalers as needed. Patient without albuterol inhaler. Reviewed COPD zones and when to call MD.   [facility] CM Care Plan Problem One   Flowsheet Row  Most Recent Value  Care Plan Problem One  Recent hospital admission for shortness of breath  Role Documenting the Problem One  Care Management [place] for Problem One  Active  THN Long Term Goal (31-90 days)  Patient will verbalize no readmission for shortness of breath in the next 60 days.   THN Long Term Goal Start Date  10/11/16  Interventions for Problem One Long Term Goal  home visit completed. Placed call to MD office to report swellling, shortness of breath.  Reiviewed discharge instructions with patient and that he could use his oxygen as needed..  no nasal canula left from [place] CM Short Term Goal #1 (0-30 days)  Patient will report taking all medications as prescribed for the next 30 days.  THN CM Short Term Goal #1 Start Date  10/11/16  Interventions for Short Term Goal #1  This home visit sent to MD to inform of no rescue inhaler.   THN CM Short Term Goal #2 (0-30 days)  Patient will verbalize understanding COPD zones and heart failure zones in the next 30 days.   THN CM Short Term Goal #2 Start Date  10/11/16  Interventions for Short Term Goal #2  Reviewed COPD zones and early recognition of symtoms and need to call MD.   [facility] CM Short Term Goal #3 (0-30 days)  Patient will verbalize  weighing daily for the next 30 days.   THN CM Short Term Goal #3 Start Date  10/16/16  Interventions for Short Tern Goal #3  Reviewed importance of daily weights. Reviewed low salt diet. reviewed importance of recording weights.  Todays weight record in [facility] calendar.  Notify MD for findings from home visit today.         3pm  During home visit placed call to MD office to report shortness of breath, weigh gain, and swelling. No MD appointments available today. Awaiting a call back from MD office.    Care planning and goal setting during home visit. Primary goal is avoid readmission.  This note sent to MD.  Tomasa Rand, RN, BSN, CEN [place] [facility] [phone]

## 2016-10-17 ENCOUNTER — Other Ambulatory Visit: Payer: Self-pay | Admitting: Family Medicine

## 2016-10-17 ENCOUNTER — Ambulatory Visit
Admission: RE | Admit: 2016-10-17 | Discharge: 2016-10-17 | Disposition: A | Discharge status: Home / Self Care | Payer: Commercial Managed Care - HMO | Source: Ambulatory Visit | Attending: Family Medicine | Admitting: Family Medicine

## 2016-10-17 DIAGNOSIS — R0602 Shortness of breath: Secondary | ICD-10-CM | POA: Diagnosis not present

## 2016-10-17 DIAGNOSIS — I5030 Unspecified diastolic (congestive) heart failure: Secondary | ICD-10-CM | POA: Diagnosis not present

## 2016-10-17 DIAGNOSIS — R059 Cough, unspecified: Secondary | ICD-10-CM

## 2016-10-17 DIAGNOSIS — R05 Cough: Secondary | ICD-10-CM

## 2016-10-17 DIAGNOSIS — J449 Chronic obstructive pulmonary disease, unspecified: Secondary | ICD-10-CM | POA: Diagnosis not present

## 2016-10-17 DIAGNOSIS — E039 Hypothyroidism, unspecified: Secondary | ICD-10-CM | POA: Diagnosis not present

## 2016-10-18 ENCOUNTER — Other Ambulatory Visit: Payer: Self-pay

## 2016-10-18 NOTE — Patient Outreach (Signed)
Care Coordination: Incoming call from Primary MD office Dr. Blenda Nicely RN.  Amy reports that patient is better after starting lasix. Reports patients weight up 9 pounds since last office visit post discharge from hospital.   Amy reports that MD will continue lasix and requested a follow up call about status from daughter next Monday with patients weight.   PLAN: Placed call to patient today. No answer. Will plan phone call to patient at the beginning of next week with a follow up home visit as well.  MD office will fax office note to [facility] office.   Tomasa Rand, RN, BSN, CEN [facility] [employer] [phone]

## 2016-10-21 ENCOUNTER — Other Ambulatory Visit: Payer: Self-pay

## 2016-10-21 NOTE — Patient Outreach (Addendum)
Transition of care follow up: Placed call to patient who reports that he is doing well. Reports decreased swelling and shortness of breath. States today's weight of 214 pounds.   Vitals:   10/21/16 1003  Weight: 214 lb (97.1 kg)    Patient reports that daughter will call MD later today with update. I will send this note.  PLAN: Offered follow up home visit for tomorrow at 3:15pm and patient has accepted.   Tomasa Rand, RN, BSN, CEN [facility] [employer] [phone]

## 2016-10-22 ENCOUNTER — Other Ambulatory Visit: Payer: Self-pay

## 2016-10-22 ENCOUNTER — Ambulatory Visit: Payer: Commercial Managed Care - HMO

## 2016-10-22 DIAGNOSIS — N179 Acute kidney failure, unspecified: Secondary | ICD-10-CM | POA: Diagnosis not present

## 2016-10-22 DIAGNOSIS — D696 Thrombocytopenia, unspecified: Secondary | ICD-10-CM | POA: Diagnosis not present

## 2016-10-22 NOTE — Patient Outreach (Signed)
[facility]) Care Management   10/22/2016  [name] [birth year] [id number]  [name] is an 83 y.o. male 3:15 pm  Arrived for acute home visit. Subjective:  Patient reports that he thinks he is better. Reports that he is not using any table salt. States that he does not like to use Nu salt or Dash. States that it does not taste good. Reports that he is not weighing first thing in the morning. States he weighs at random times during the day. Reports that legs are less swollen and that his watch is looser than normal. Reports that he chooses to take his lasix 2 tablets in the evening due to his commitments during the day and inability to have access to bathroom. Reports that he continues to use his CPAP at night.  Patients weight log ranges from 214-220 since last home visit. Patient and daughter report that patient is now using Verio one touch better is monitoring daily.  Range is 188-233 non fasting readings.  Objective:  Awake and alert. Less short of breath.   Vitals:   10/22/16 1639  BP: (!) 144/78  Pulse: 91  Resp: 18  SpO2: 94%  Weight: 215 lb (97.5 kg)   Review of Systems  Constitutional: Negative.   HENT: Negative.   Eyes: Negative.   Respiratory: Positive for shortness of breath.   Cardiovascular: Positive for leg swelling.  Gastrointestinal: Negative.   Genitourinary: Positive for frequency.  Musculoskeletal: Negative.   Skin: Negative.   Neurological: Negative.   Endo/Heme/Allergies: Negative.   Psychiatric/Behavioral: Negative.     Physical Exam  Constitutional: He appears well-developed and well-nourished.  Cardiovascular: Normal rate, normal heart sounds and intact distal pulses.   Respiratory: Effort normal and breath sounds normal.  Lungs clear.  No cough noted during home visit.   GI: Soft. Bowel sounds are normal.  Musculoskeletal: He exhibits edema.  Less edema in both legs.  1 plus to the right and 2 plus to the left. Edema is  between calf on knee on the left.  Legs less shiny.   Skin: Skin is warm and dry.  Psychiatric: He has a normal mood and affect. His behavior is normal. Judgment and thought content normal.    Encounter Medications:   Outpatient Encounter Prescriptions as of 10/22/2016  Medication Sig  . albuterol (PROVENTIL) (2.5 MG/3ML) 0.083% nebulizer solution Take 3 mLs (2.5 mg total) by nebulization every 4 (four) hours as needed for wheezing or shortness of breath.  . alfuzosin (UROXATRAL) 10 MG 24 hr tablet Take 10 mg by mouth at bedtime.  Marland Kitchen aspirin EC 81 MG tablet Take 81 mg by mouth at bedtime.   . budesonide-formoterol (SYMBICORT) 160-4.5 MCG/ACT inhaler Inhale 2 puffs into the lungs 2 (two) times daily.  . Fluticasone-Umeclidin-Vilant 100-62.5-25 MCG/INH AEPB Inhale 1 Dose into the lungs daily.  . furosemide (LASIX) 20 MG tablet Take 40 mg by mouth daily. Patient chooses to take in the evening  . glimepiride (AMARYL) 2 MG tablet Take 2 mg by mouth at bedtime.   . isosorbide mononitrate (IMDUR) 60 MG 24 hr tablet Take 60 mg by mouth at bedtime.  Marland Kitchen latanoprost (XALATAN) 0.005 % ophthalmic solution Place 1 drop into both eyes at bedtime.   Marland Kitchen levothyroxine (SYNTHROID, LEVOTHROID) 125 MCG tablet Take 250 mcg by mouth daily before breakfast.   . loperamide (IMODIUM) 2 MG capsule Take 2 mg by mouth as needed for diarrhea or loose stools.  . meloxicam (MOBIC) 15  MG tablet Take 15 mg by mouth at bedtime.   . metoprolol succinate (TOPROL-XL) 50 MG 24 hr tablet Take 50 mg by mouth at bedtime. Take with or immediately following a meal.  . mirabegron ER (MYRBETRIQ) 25 MG TB24 tablet Take 25 mg by mouth at bedtime.  . nitroGLYCERIN (NITROSTAT) 0.4 MG SL tablet Place 0.4 mg under the tongue every 5 (five) minutes x 3 doses as needed for chest pain. For chest pain.  . pantoprazole (PROTONIX) 40 MG tablet Take 40 mg by mouth 2 (two) times daily.   . rosuvastatin (CRESTOR) 20 MG tablet Take 20 mg by mouth at bedtime.   Marland Kitchen albuterol (PROVENTIL HFA;VENTOLIN HFA) 108 (90 BASE) MCG/ACT inhaler Inhale 2 puffs into the lungs every 6 (six) hours as needed for wheezing or shortness of breath.   . Apple Cid Vn-Grn Tea-Bit Or-Cr (APPLE CIDER VINEGAR PLUS PO) Take 1 capsule by mouth at bedtime.    No facility-administered encounter medications on file as of 10/22/2016.     Functional Status:   In your present state of health, do you have any difficulty performing the following activities: 10/16/2016 09/22/2016  Hearing? N N  Vision? N N  Difficulty concentrating or making decisions? N N  Walking or climbing stairs? N N  Dressing or bathing? N N  Doing errands, shopping? N N  Preparing Food and eating ? N -  Using the Toilet? N -  In the past six months, have you accidently leaked urine? N -  Do you have problems with loss of bowel control? N -  Managing your Medications? N -  Managing your Finances? N -  Housekeeping or managing your Housekeeping? N -  Some recent data might be hidden    Fall/Depression Screening:    PHQ 2/9 Scores 10/16/2016  PHQ - 2 Score 0    Assessment:   (1) not weighing first things in the mornings. (2) not adding table salt.   (3) now monitoring CBG daily. (4) daughter reports that patient has nasal canula now.   Plan:  (1) reviewed with patient and daughter the need to weigh daily in the same clothes using the same scale at the same time of the day before eating or drinking.. Patient voiced understanding and states he will try to do this. Also recommended patient get a digital scale to assist with easier and more accurate readings.  (2) reviewed importance of following a low salt diet.  (3) Will send this note to MD to request a refill for strips and lancets for Verio meter that patient likes and is using daily. Encouraged patient to continue to monitor and record readings daily. (4) Encouraged patient to be mindful when he is short of breath and needs to rest. Patient not using  nasal canula at this time as he his busy during the daytime hours.  Will continue to monitor.  Will continue to outreach patient weekly and follow up for transition of care. THN CM Care Plan Problem One   Flowsheet Row Most Recent Value  Care Plan Problem One  Recent hospital admission for shortness of breath  Role Documenting the Problem One  Care Management [place] for Problem One  Active  THN Long Term Goal (31-90 days)  Patient will verbalize no readmission for shortness of breath in the next 60 days.   THN Long Term Goal Start Date  10/11/16  Interventions for Problem One Long Term Goal  acute home visit completed per MD  request for follow up.  Reviewed when to call MD for a weight gain of 3 pounds overnight or 5 pounds in a week. reviewed medications and reasons for taking lasix. Encouraged patient to take lasix in the mornings.   THN CM Short Term Goal #1 (0-30 days)  Patient will report taking all medications as prescribed for the next 30 days.  THN CM Short Term Goal #1 Start Date  10/11/16  Interventions for Short Term Goal #1  reviewed importance of not skipping an meds.  THN CM Short Term Goal #2 (0-30 days)  Patient will verbalize understanding COPD zones and heart failure zones in the next 30 days.   THN CM Short Term Goal #2 Start Date  10/11/16  Interventions for Short Term Goal #2  Reviewed COPD zones and early recognition of symtoms and need to call MD.   [facility] CM Short Term Goal #3 (0-30 days)  Patient will verbalize weighing daily for the next 30 days.   THN CM Short Term Goal #3 Start Date  10/16/16  Interventions for Short Tern Goal #3  Encouraged patient to get a digital scale and weight daily in the mornings and record weights. Encouraged daughter to monitor daily weights.       Tomasa Rand, RN, BSN, CEN [facility] [employer] [phone]

## 2016-10-23 ENCOUNTER — Ambulatory Visit: Payer: Self-pay

## 2016-10-23 DIAGNOSIS — I5032 Chronic diastolic (congestive) heart failure: Secondary | ICD-10-CM | POA: Diagnosis not present

## 2016-10-24 ENCOUNTER — Encounter: Payer: Self-pay | Admitting: Family

## 2016-10-26 DIAGNOSIS — J440 Chronic obstructive pulmonary disease with acute lower respiratory infection: Secondary | ICD-10-CM | POA: Diagnosis not present

## 2016-10-26 DIAGNOSIS — G4733 Obstructive sleep apnea (adult) (pediatric): Secondary | ICD-10-CM | POA: Diagnosis not present

## 2016-10-29 ENCOUNTER — Encounter (HOSPITAL_COMMUNITY): Payer: Commercial Managed Care - HMO

## 2016-10-29 ENCOUNTER — Other Ambulatory Visit: Payer: Self-pay

## 2016-10-29 ENCOUNTER — Ambulatory Visit: Payer: Commercial Managed Care - HMO | Admitting: Family

## 2016-10-29 NOTE — Patient Outreach (Signed)
Transition of care:  Placed call to patient who reports that he is doing well. States weight is running "215 pounds".  Reports that he is weighing every morning and taking his medications as prescribed.  Patient reports decrease in swelling and decrease in shortness of breath. Reports following his low salt diet.   Declines any new problems or concerns.  Reviewed importance of keeping follow up MD appointment on 10/31/2016.  Reviewed for patient to continue to weigh daily and take all medications as prescribed.  PLAN: will follow up with patient next week via phone for transition of care.  Tomasa Rand, RN, BSN, CEN [facility] [employer] [phone]

## 2016-11-05 ENCOUNTER — Other Ambulatory Visit: Payer: Self-pay

## 2016-11-05 NOTE — Patient Outreach (Signed)
Transition of care call:  Placed call to patient who reports that he is doing well. States swelling is about the same. Reports breathing is the same. States that his current weight is 214 today.  States that his daughter has been checking his CBG and reports that it is good. Patient unable to give me numbers.   Denies any new problems or concerns today.   PLAN: will continue weekly outreach attempts.   Tomasa Rand, RN, BSN, CEN [facility] [employer] [phone]

## 2016-11-12 ENCOUNTER — Other Ambulatory Visit: Payer: Self-pay

## 2016-11-12 ENCOUNTER — Ambulatory Visit: Payer: Self-pay

## 2016-11-12 NOTE — Patient Outreach (Signed)
Final transition of care call: Placed call to patient who reports that he is doing well. Reports weight is running the same.  Today's weight of 214.  Reports range is 214 to 215.   States that he continues to have the same amounts of swelling and shortness of breath.  Denies any new problems or concerns.  PLAN:  Patient has successfully completed 30 day transition of care program.  Will plan to follow up in 1 month. Encouraged patient to call sooner if needed.  Tomasa Rand, RN, BSN, CEN [facility] [employer] [phone]

## 2016-11-23 DIAGNOSIS — I5032 Chronic diastolic (congestive) heart failure: Secondary | ICD-10-CM | POA: Diagnosis not present

## 2016-11-26 DIAGNOSIS — G4733 Obstructive sleep apnea (adult) (pediatric): Secondary | ICD-10-CM | POA: Diagnosis not present

## 2016-11-26 DIAGNOSIS — J440 Chronic obstructive pulmonary disease with acute lower respiratory infection: Secondary | ICD-10-CM | POA: Diagnosis not present

## 2016-11-29 DIAGNOSIS — I110 Hypertensive heart disease with heart failure: Secondary | ICD-10-CM | POA: Diagnosis not present

## 2016-11-29 DIAGNOSIS — E039 Hypothyroidism, unspecified: Secondary | ICD-10-CM | POA: Diagnosis not present

## 2016-11-29 DIAGNOSIS — I5030 Unspecified diastolic (congestive) heart failure: Secondary | ICD-10-CM | POA: Diagnosis not present

## 2016-11-29 DIAGNOSIS — I739 Peripheral vascular disease, unspecified: Secondary | ICD-10-CM | POA: Diagnosis not present

## 2016-11-29 DIAGNOSIS — Z7984 Long term (current) use of oral hypoglycemic drugs: Secondary | ICD-10-CM | POA: Diagnosis not present

## 2016-11-29 DIAGNOSIS — E782 Mixed hyperlipidemia: Secondary | ICD-10-CM | POA: Diagnosis not present

## 2016-11-29 DIAGNOSIS — J449 Chronic obstructive pulmonary disease, unspecified: Secondary | ICD-10-CM | POA: Diagnosis not present

## 2016-11-29 DIAGNOSIS — I2510 Atherosclerotic heart disease of native coronary artery without angina pectoris: Secondary | ICD-10-CM | POA: Diagnosis not present

## 2016-11-29 DIAGNOSIS — E1159 Type 2 diabetes mellitus with other circulatory complications: Secondary | ICD-10-CM | POA: Diagnosis not present

## 2016-12-05 ENCOUNTER — Ambulatory Visit (INDEPENDENT_AMBULATORY_CARE_PROVIDER_SITE_OTHER): Payer: Medicare HMO | Admitting: Cardiology

## 2016-12-05 ENCOUNTER — Encounter: Payer: Self-pay | Admitting: Cardiology

## 2016-12-05 VITALS — BP 124/64 | HR 88 | Ht 64.0 in | Wt 221.8 lb

## 2016-12-05 DIAGNOSIS — I10 Essential (primary) hypertension: Secondary | ICD-10-CM | POA: Diagnosis not present

## 2016-12-05 DIAGNOSIS — I5032 Chronic diastolic (congestive) heart failure: Secondary | ICD-10-CM | POA: Diagnosis not present

## 2016-12-05 DIAGNOSIS — I2510 Atherosclerotic heart disease of native coronary artery without angina pectoris: Secondary | ICD-10-CM

## 2016-12-05 DIAGNOSIS — G4733 Obstructive sleep apnea (adult) (pediatric): Secondary | ICD-10-CM

## 2016-12-05 DIAGNOSIS — E669 Obesity, unspecified: Secondary | ICD-10-CM

## 2016-12-05 DIAGNOSIS — R600 Localized edema: Secondary | ICD-10-CM

## 2016-12-05 DIAGNOSIS — E785 Hyperlipidemia, unspecified: Secondary | ICD-10-CM

## 2016-12-05 DIAGNOSIS — R079 Chest pain, unspecified: Secondary | ICD-10-CM

## 2016-12-05 LAB — CBC WITH DIFFERENTIAL/PLATELET
BASOS: 1 %
Basophils Absolute: 0 10*3/uL (ref 0.0–0.2)
EOS (ABSOLUTE): 0.2 10*3/uL (ref 0.0–0.4)
EOS: 3 %
HEMATOCRIT: 38.9 % (ref 37.5–51.0)
Hemoglobin: 13 g/dL (ref 13.0–17.7)
Immature Grans (Abs): 0 10*3/uL (ref 0.0–0.1)
Immature Granulocytes: 0 %
LYMPHS ABS: 2.1 10*3/uL (ref 0.7–3.1)
Lymphs: 33 %
MCH: 29.4 pg (ref 26.6–33.0)
MCHC: 33.4 g/dL (ref 31.5–35.7)
MCV: 88 fL (ref 79–97)
MONOS ABS: 0.6 10*3/uL (ref 0.1–0.9)
Monocytes: 9 %
Neutrophils Absolute: 3.6 10*3/uL (ref 1.4–7.0)
Neutrophils: 54 %
Platelets: 137 10*3/uL — ABNORMAL LOW (ref 150–379)
RBC: 4.42 x10E6/uL (ref 4.14–5.80)
RDW: 14.7 % (ref 12.3–15.4)
WBC: 6.5 10*3/uL (ref 3.4–10.8)

## 2016-12-05 LAB — BASIC METABOLIC PANEL
BUN / CREAT RATIO: 12 (ref 10–24)
BUN: 14 mg/dL (ref 8–27)
CO2: 28 mmol/L (ref 18–29)
CREATININE: 1.16 mg/dL (ref 0.76–1.27)
Calcium: 9.2 mg/dL (ref 8.6–10.2)
Chloride: 99 mmol/L (ref 96–106)
GFR calc Af Amer: 66 (ref >59)
GFR, EST NON AFRICAN AMERICAN: 58 — AB (ref >59)
GLUCOSE: 108 mg/dL — AB (ref 65–99)
POTASSIUM: 3.9 mmol/L (ref 3.5–5.2)
Sodium: 141 mmol/L (ref 134–144)

## 2016-12-05 LAB — LIPID PANEL
CHOL/HDL RATIO: 3.4 (ref 0.0–5.0)
Cholesterol, Total: 131 mg/dL (ref 100–199)
HDL: 39 mg/dL — ABNORMAL LOW (ref >39)
LDL Calculated: 77 (ref 0–99)
Triglycerides: 74 mg/dL (ref 0–149)
VLDL Cholesterol Cal: 15 (ref 5–40)

## 2016-12-05 LAB — HEPATIC FUNCTION PANEL
ALBUMIN: 4.2 g/dL (ref 3.5–4.7)
ALT: 16 IU/L (ref 0–44)
AST: 22 IU/L (ref 0–40)
Alkaline Phosphatase: 39 IU/L (ref 39–117)
BILIRUBIN TOTAL: 0.5 mg/dL (ref 0.0–1.2)
BILIRUBIN, DIRECT: 0.15 mg/dL (ref 0.00–0.40)
TOTAL PROTEIN: 6.4 g/dL (ref 6.0–8.5)

## 2016-12-05 LAB — TSH: TSH: 3.93 u[IU]/mL (ref 0.450–4.500)

## 2016-12-05 MED ORDER — FUROSEMIDE 40 MG PO TABS: 40.0000 mg | ORAL_TABLET | Freq: Every day | ORAL | 11 refills | Status: DC

## 2016-12-05 NOTE — Progress Notes (Signed)
Cardiology Office Note    Date:  12/05/2016   ID:  REYNOLD SCHLAG, DOB 1930-09-28, MRN [id number]  PCP:  [name], MD  Cardiologist:  Fransico Him, MD   Chief Complaint  Patient presents with  . Coronary Artery Disease  . Hypertension  . Hyperlipidemia  . Sleep Apnea    History of Present Illness:  [name] is a 83 y.o. male  with a history of ASCAD, HTN, dyslipidemia, OSA on CPAP, chronic diastolic CHF and obesity who presents today for followup. He was hospitalized in December with acute bronchitis and sepsis syndrome with secondary acute on chronic diastolic CHF.  He was diuresed and is doing much better. He has been having chest pain recently.  He says that it occurs once weekly and usually lasts up to 15 minutes. He has not used any SL NTG.  He denies any SOB, nausea or diaphoresis with the pain.  This is different from his typical anginal pain.  He thinks it is related to indigestion.  He denies any dizziness, palpitations or syncope. He has chronic DOE which is stable with no change.  He has some chronic LLE edema which is the leg he had the DVT in the past. he says that the swelling is worse than it was in the past. He tolerates the CPAP and full face mask well. He feels the pressure is adequate. He feels rested in the am. He sleeps in the chair at night some because he cannot get comfortable in bed.  He is vague in why he cant get comfortable.  He denies any PND or orthopnea.  Past Medical History:  Diagnosis Date  . Anxiety   . Asthma   . Chronic diastolic CHF (congestive heart failure) (HCC)    diastolic   . COPD (chronic obstructive pulmonary disease) ([place])   . Coronary artery disease    s/p PCI of RCA 03/2009 at which time there was 70% ramus and 90% RCA, cath 01/2011 showed patent stents in RCA with moderate prox disesae, aneurysmal left circ and small 90% ramus s/p PCI, patent LAD EF 55%  . Diabetes mellitus   . Diverticulitis Oct. 2013   bleeding in  the past and Effient for his CAD was stopped  . DVT (deep venous thrombosis) ([place])   . GERD (gastroesophageal reflux disease)   . Hypercholesterolemia   . Hypertension   . Hypothyroidism   . Obesity (BMI 30-39.9)   . OSA (obstructive sleep apnea)    severe on CPAP  . Peripheral vascular disease ([place]) 11/2006   s/p left stent  . Shortness of breath   . Sleep apnea    severe OSA awaiting CPAP titration    Past Surgical History:  Procedure Laterality Date  . ABDOMINAL AORTAGRAM N/A 04/20/2012   Procedure: ABDOMINAL Maxcine Ham;  Surgeon: Angelia Mould, MD;  Location: [facility] CATH LAB;  Service: Cardiovascular;  Laterality: N/A;  . ABDOMINAL AORTAGRAM N/A 12/29/2012   Procedure: ABDOMINAL Maxcine Ham;  Surgeon: Serafina Mitchell, MD;  Location: [facility] CATH LAB;  Service: Cardiovascular;  Laterality: N/A;  . BALLOON DILATION  12/26/2011   Procedure: BALLOON DILATION;  Surgeon: Garlan Fair, MD;  Location: [facility] ENDOSCOPY;  Service: Endoscopy;  Laterality: N/A;  . COLONOSCOPY  08/14/2012   Procedure: COLONOSCOPY;  Surgeon: Arta Silence, MD;  Location: [facility] ENDOSCOPY;  Service: Endoscopy;  Laterality: N/A;  . CORONARY STENT PLACEMENT  2010 and 2012  . ESOPHAGOGASTRODUODENOSCOPY  12/26/2011   Procedure: ESOPHAGOGASTRODUODENOSCOPY (EGD);  Surgeon:  Garlan Fair, MD;  Location: [facility];  Service: Endoscopy;  Laterality: N/A;  . ESOPHAGOGASTRODUODENOSCOPY  08/13/2012   Procedure: ESOPHAGOGASTRODUODENOSCOPY (EGD);  Surgeon: Arta Silence, MD;  Location: [facility];  Service: Endoscopy;  Laterality: Left;  . HERNIA REPAIR  1992   evntral hernia repair  . LOWER EXTREMITY ANGIOGRAM Bilateral 12/29/2012   Procedure: LOWER EXTREMITY ANGIOGRAM;  Surgeon: Serafina Mitchell, MD;  Location: [facility] CATH LAB;  Service: Cardiovascular;  Laterality: Bilateral;  bilat lower extrem angio    Current Medications: Current Meds  Medication Sig  . albuterol (PROVENTIL HFA;VENTOLIN HFA) 108 (90 BASE) MCG/ACT inhaler  Inhale 2 puffs into the lungs every 6 (six) hours as needed for wheezing or shortness of breath.   Marland Kitchen albuterol (PROVENTIL) (2.5 MG/3ML) 0.083% nebulizer solution Take 3 mLs (2.5 mg total) by nebulization every 4 (four) hours as needed for wheezing or shortness of breath.  . alfuzosin (UROXATRAL) 10 MG 24 hr tablet Take 10 mg by mouth at bedtime.  Marland Kitchen Apple Cid Vn-Grn Tea-Bit Or-Cr (APPLE CIDER VINEGAR PLUS PO) Take 1 capsule by mouth at bedtime.   Marland Kitchen aspirin EC 81 MG tablet Take 81 mg by mouth at bedtime.   . budesonide-formoterol (SYMBICORT) 160-4.5 MCG/ACT inhaler Inhale 2 puffs into the lungs 2 (two) times daily.  . Fluticasone-Umeclidin-Vilant 100-62.5-25 MCG/INH AEPB Inhale 1 Dose into the lungs daily.  . furosemide (LASIX) 20 MG tablet Take 40 mg by mouth daily. Patient chooses to take in the evening  . glimepiride (AMARYL) 2 MG tablet Take 2 mg by mouth at bedtime.   . isosorbide mononitrate (IMDUR) 60 MG 24 hr tablet Take 60 mg by mouth at bedtime.  Marland Kitchen levothyroxine (SYNTHROID, LEVOTHROID) 125 MCG tablet Take 250 mcg by mouth daily before breakfast.   . loperamide (IMODIUM) 2 MG capsule Take 2 mg by mouth as needed for diarrhea or loose stools.  . meloxicam (MOBIC) 15 MG tablet Take 15 mg by mouth at bedtime.   . metoprolol succinate (TOPROL-XL) 50 MG 24 hr tablet Take 50 mg by mouth at bedtime. Take with or immediately following a meal.  . nitroGLYCERIN (NITROSTAT) 0.4 MG SL tablet Place 0.4 mg under the tongue every 5 (five) minutes x 3 doses as needed for chest pain. For chest pain.  . pantoprazole (PROTONIX) 40 MG tablet Take 40 mg by mouth 2 (two) times daily.   . rosuvastatin (CRESTOR) 20 MG tablet Take 20 mg by mouth at bedtime.    Allergies:   Clopidogrel   Social History   Social History  . Marital status: Widowed    Spouse name: N/A  . Number of children: N/A  . Years of education: N/A   Social History Main Topics  . Smoking status: Former Smoker    Packs/day: 0.50     Years: 15.00    Types: Cigarettes, Cigars    Quit date: 10/15/1963  . Smokeless tobacco: Never Used  . Alcohol use No  . Drug use: No  . Sexual activity: No   Other Topics Concern  . None   Social History Narrative  . None     Family History:  The patient's family history includes Asthma in his sister; Breast cancer in his sister; Diabetes in his brother, mother, and sister; Heart attack in his mother and sister; Heart disease in his father, mother, and sister; Hyperlipidemia in his mother, sister, and sister; Hypertension in his mother, sister, and sister.   ROS:   Please see the history of  present illness.    ROS All other systems reviewed and are negative.  No flowsheet data found.     PHYSICAL EXAM:   VS:  BP 124/64   Pulse 88   Ht 5\' 4"  (1.626 m)   Wt 221 lb 12.8 oz (100.6 kg)   BMI 38.07 kg/m    GEN: Well nourished, well developed, in no acute distress  HEENT: normal  Neck: no carotid bruits, or masses.  JVP cannot be assessed due to body habitus Cardiac: RRR; no murmurs, rubs, or gallops.  1+ edema.  Intact distal pulses bilaterally.  Respiratory:  clear to auscultation bilaterally, normal work of breathing GI: soft, nontender, nondistended, + BS MS: no deformity or atrophy  Skin: warm and dry, no rash Neuro:  Alert and Oriented x 3, Strength and sensation are intact Psych: euthymic mood, full affect  Wt Readings from Last 3 Encounters:  12/05/16 221 lb 12.8 oz (100.6 kg)  11/12/16 214 lb (97.1 kg)  11/05/16 214 lb (97.1 kg)      Studies/Labs Reviewed:   EKG:  EKG is not ordered today.   Recent Labs: 09/22/2016: ALT 23; B Natriuretic Peptide 18.3; TSH 10.936 09/23/2016: Hemoglobin 14.0; Platelets 144 09/24/2016: Magnesium 1.9 09/25/2016: BUN 37; Creatinine, Ser 1.23; Potassium 4.8; Sodium 136   Lipid Panel    Component Value Date/Time   CHOL 152 11/16/2014 1124   TRIG 99.0 11/16/2014 1124   HDL 42.60 11/16/2014 1124   CHOLHDL 4 11/16/2014 1124     VLDL 19.8 11/16/2014 1124   LDLCALC 90 11/16/2014 1124    Additional studies/ records that were reviewed today include:  CPAP download    ASSESSMENT:    1. Coronary artery disease involving native coronary artery of native heart without angina pectoris   2. Chronic diastolic CHF (congestive heart failure) ([place])   3. HYPERTENSION, BENIGN   4. OSA (obstructive sleep apnea)   5. Dyslipidemia   6. Obesity (BMI 30-39.9)   7. Edema extremities      PLAN:  In order of problems listed above: 1.  ASCAD - s/p PCI of RCA 03/2009 at which time there was 70% ramus and 90% RCA, cath 01/2011 showed patent stents in RCA with moderate prox disesae, aneurysmal left circ and small 90% ramus s/p PCI, patent LAD EF 55%.  He has doing well with no typical angina although he has had some problems with atypical CP that he thinks is related to GERD and is unlike his anginal pain. This could be due to volume overload with his CHF.  I will get a lexiscan myoview to rule out ischemia.  Continue ASA/long acting nitrate/BB.      2.  Chronic diastolic CHF - s/p recent admission for acute bronchitis with secondary COPD exacerbation.  He appears volume overloaded with increased LE edema.   Prior to his admission in December his dry weight was 208 to 210lbs.  He is 221lbs today (12lbs up today).  He is not compliant with his diuretic and does not take it very much because he wets his pant. I have stressed the importance of taking his diuretic daily as prescribed.  I will increase his Lasix to 40mg  BID for 3 days and then 40mg  daily.  I am going to Check BMET in a week.  I will have him seen by the extender in 1 week to make sure his fluid status has improved. He will continue to weigh daily and call if weight is not dropping. I will  also check a TSH, CBC to make sure there are no other reasons from CHF.  3.  HTN - BP controlled on current medical regimen.  Continue BB.  4.  OSA - the patient is tolerating PAP therapy  well without any problems.   The patient has been using and benefiting from CPAP use and will continue to benefit from therapy. I will get a d/l from the DM  5.  Dyslipidemia - continue statin.  LDL goal is < 70. Check FLP and ALT.   6.  Obesity -I have encouraged him to get into a routine exercise program and cut back on carbs and portions.   7.  LE edema - poorly controlled on diuretic therapy and he is not compliant with his diuretics.  Lasix as above.    Medication Adjustments/Labs and Tests Ordered: Current medicines are reviewed at length with the patient today.  Concerns regarding medicines are outlined above.  Medication changes, Labs and Tests ordered today are listed in the Patient Instructions below.  There are no Patient Instructions on file for this visit.   Signed, Fransico Him, MD  12/05/2016 9:02 AM    [facility] [place], [place], [place]  [address] Phone: [phone]; Fax: [phone]

## 2016-12-05 NOTE — Patient Instructions (Signed)
Medication Instructions:  1) INCREASE LASIX to 40 mg TWICE DAILY for 3 days then decrease back to 40 mg daily  Labwork: TODAY: BMET, CBC, TSH, LFTs, Lipids  Testing/Procedures: Your physician has requested that you have a lexiscan myoview. For further information please visit [email or web address]. Please follow instruction sheet, as given.  Follow-Up: Your physician recommends that you schedule a follow-up appointment in [place] with Dr. Theodosia Blender assistant. You will have labs drawn this day as well.  Your physician recommends that you schedule a follow-up appointment in 3 MONTHS with Dr. Radford Pax.  Any Other Special Instructions Will Be Listed Below (If Applicable).     If you need a refill on your cardiac medications before your next appointment, please call your pharmacy.

## 2016-12-06 ENCOUNTER — Telehealth: Payer: Self-pay

## 2016-12-06 DIAGNOSIS — E785 Hyperlipidemia, unspecified: Secondary | ICD-10-CM

## 2016-12-06 MED ORDER — ROSUVASTATIN CALCIUM 40 MG PO TABS: 40.0000 mg | ORAL_TABLET | Freq: Every day | ORAL | 11 refills | Status: DC

## 2016-12-06 NOTE — Telephone Encounter (Signed)
Informed patient's DPR of results and verbal understanding expressed.   Instructed her to INCREASE CRESTOR to 40 mg daily. She requests to have patient have fasting labs repeated at next OV with Dr. Radford Pax in May as it is hard to get off work. DPR agrees with treatment plan.

## 2016-12-06 NOTE — Telephone Encounter (Signed)
-----   Message from Sueanne Margarita, MD sent at 12/05/2016  3:43 PM EST ----- LDL not at goal - increase Crestor to 40mg  daily and repeat FLP and ALT in 6 weeks

## 2016-12-10 ENCOUNTER — Telehealth (HOSPITAL_COMMUNITY): Payer: Self-pay | Admitting: *Deleted

## 2016-12-10 ENCOUNTER — Other Ambulatory Visit: Payer: Self-pay

## 2016-12-10 NOTE — Patient Outreach (Signed)
Case closure phone call: Placed call to patient who reports that he is doing well.  States that he saw cardiologist last week and increased his medications.  Upon review of chart noted that patient has not been taking his medications as prescribed because of having to go pee so often.  Patient admits this to me today. States that he is very busy and the fluid pill gets in his way.  Reviewed with patient the importance of taking his medications every to help decrease swelling and shortness of breath. Patient voices understanding of why it is important to take his medications. Patient states that his weight today is 209.  Reports the weight last week was not right because he was full dressed and had his shoes on.    Patient denies any increase in shortness of breath states his breathing is the same.  Reviewed with a patient that he has completed transition of care program without a readmission. Patient denies any further needs.  I reminded patient of his upcoming test this week on 12/12/2016 and he voiced understanding.   PLAN: Will close case as goals are met.  Patient reports that he knows he needs to take his medications as prescribed.  Will send MD case closure letter. Will send patient case closure letter. Will notify [facility] care management assistant to close case.  Tomasa Rand, RN, BSN, CEN [facility] [employer] [phone]

## 2016-12-10 NOTE — Telephone Encounter (Signed)
Patient given detailed instructions per Myocardial Perfusion Study Information Sheet for the test on 12/12/16 at 1245. Patient notified to arrive 15 minutes early and that it is imperative to arrive on time for appointment to keep from having the test rescheduled.  If you need to cancel or reschedule your appointment, please call the office within 24 hours of your appointment. Failure to do so may result in a cancellation of your appointment, and a $50 no show fee. Patient verbalized understanding.Kamren Heskett, [place]e [place]

## 2016-12-12 ENCOUNTER — Encounter: Payer: Self-pay | Admitting: Family

## 2016-12-12 ENCOUNTER — Ambulatory Visit (HOSPITAL_COMMUNITY): Payer: Medicare HMO | Attending: Cardiology

## 2016-12-12 DIAGNOSIS — R9439 Abnormal result of other cardiovascular function study: Secondary | ICD-10-CM | POA: Diagnosis not present

## 2016-12-12 DIAGNOSIS — R079 Chest pain, unspecified: Secondary | ICD-10-CM | POA: Diagnosis not present

## 2016-12-12 MED ORDER — REGADENOSON 0.4 MG/5ML IV SOLN
0.4000 mg | Freq: Once | INTRAVENOUS | Status: AC
  Administered 2016-12-12: 0.4 mg via INTRAVENOUS

## 2016-12-12 MED ORDER — TECHNETIUM TC 99M TETROFOSMIN IV KIT
32.7000 | PACK | Freq: Once | INTRAVENOUS | Status: AC | PRN
  Administered 2016-12-12: 32.7 via INTRAVENOUS
  Filled 2016-12-12: qty 33

## 2016-12-13 ENCOUNTER — Ambulatory Visit (HOSPITAL_COMMUNITY): Payer: Medicare HMO | Attending: Cardiovascular Disease

## 2016-12-13 LAB — MYOCARDIAL PERFUSION IMAGING
CHL CUP NUCLEAR SRS: 3
CHL CUP NUCLEAR SSS: 3
LHR: 0.28
LV dias vol: 127 mL (ref 62–150)
LV sys vol: 64 mL
NUC STRESS TID: 0.89
Peak HR: 104 {beats}/min
Rest HR: 86 {beats}/min
SDS: 0

## 2016-12-13 MED ORDER — TECHNETIUM TC 99M TETROFOSMIN IV KIT
31.9000 | PACK | Freq: Once | INTRAVENOUS | Status: AC | PRN
  Administered 2016-12-13: 31.9 via INTRAVENOUS
  Filled 2016-12-13: qty 32

## 2016-12-16 ENCOUNTER — Ambulatory Visit: Payer: Medicare HMO | Admitting: Physician Assistant

## 2016-12-16 NOTE — Progress Notes (Deleted)
Cardiology Office Note   Date:  12/16/2016   ID:  [name], DOB 06-12-1930, MRN [id number]  PCP:  [name], MD  Cardiologist:  Dr. Radford Pax    No chief complaint on file.     History of Present Illness: [name] is a 83 y.o. male who presents for results of stress myoview with EF 50%, there is no change from previous.  Dr Radford Pax has reviewed and feels his pain is GI.   pt with hx of CAD, HTN and dyslipidemia OSA on CPAP, chronic diastolic CHF and obesity He was hospitalized in December with acute bronchitis and sepsis syndrome with secondary acute on chronic diastolic CHF.  He was diuresed and is doing much better. He has been having chest pain recently.  He says that it occurs once weekly and usually lasts up to 15 minutes. He has not used any SL NTG.  He denies any SOB, nausea or diaphoresis with the pain.  This is different from his typical anginal pain.  He thinks it is related to indigestion.  He denies any dizziness, palpitations or syncope. He has chronic DOE which is stable with no change. He has some chronic LLE edema which is the leg he had the DVT in the past. he says that the swelling is worse than it was in the past.  s/p PCI of RCA 03/2009 at which time there was 70% ramus and 90% RCA, cath 01/2011 showed patent stents in RCA with moderate prox disesae, aneurysmal left circ and small 90% ramus s/p PCI, patent LAD EF 55%.    Last visit he had volume overloaed.  Lasix increased to 40 BID for 3 days then 40 mg daily.  He needs BMet.   TSH was normal.    Today***    Past Medical History:  Diagnosis Date  . Anxiety   . Asthma   . Chronic diastolic CHF (congestive heart failure) (HCC)    diastolic   . COPD (chronic obstructive pulmonary disease) ([place])   . Coronary artery disease    s/p PCI of RCA 03/2009 at which time there was 70% ramus and 90% RCA, cath 01/2011 showed patent stents in RCA with moderate prox disesae, aneurysmal left circ and small 90% ramus  s/p PCI, patent LAD EF 55%  . Diabetes mellitus   . Diverticulitis Oct. 2013   bleeding in the past and Effient for his CAD was stopped  . DVT (deep venous thrombosis) ([place])   . GERD (gastroesophageal reflux disease)   . Hypercholesterolemia   . Hypertension   . Hypothyroidism   . Obesity (BMI 30-39.9)   . OSA (obstructive sleep apnea)    severe on CPAP  . Peripheral vascular disease ([place]) 11/2006   s/p left stent  . Shortness of breath   . Sleep apnea    severe OSA awaiting CPAP titration    Past Surgical History:  Procedure Laterality Date  . ABDOMINAL AORTAGRAM N/A 04/20/2012   Procedure: ABDOMINAL Maxcine Ham;  Surgeon: Angelia Mould, MD;  Location: [facility] CATH LAB;  Service: Cardiovascular;  Laterality: N/A;  . ABDOMINAL AORTAGRAM N/A 12/29/2012   Procedure: ABDOMINAL Maxcine Ham;  Surgeon: Serafina Mitchell, MD;  Location: [facility] CATH LAB;  Service: Cardiovascular;  Laterality: N/A;  . BALLOON DILATION  12/26/2011   Procedure: BALLOON DILATION;  Surgeon: Garlan Fair, MD;  Location: [facility] ENDOSCOPY;  Service: Endoscopy;  Laterality: N/A;  . COLONOSCOPY  08/14/2012   Procedure: COLONOSCOPY;  Surgeon: Arta Silence, MD;  Location: [facility] ENDOSCOPY;  Service: Endoscopy;  Laterality: N/A;  . CORONARY STENT PLACEMENT  2010 and 2012  . ESOPHAGOGASTRODUODENOSCOPY  12/26/2011   Procedure: ESOPHAGOGASTRODUODENOSCOPY (EGD);  Surgeon: Garlan Fair, MD;  Location: [facility];  Service: Endoscopy;  Laterality: N/A;  . ESOPHAGOGASTRODUODENOSCOPY  08/13/2012   Procedure: ESOPHAGOGASTRODUODENOSCOPY (EGD);  Surgeon: Arta Silence, MD;  Location: [facility];  Service: Endoscopy;  Laterality: Left;  . HERNIA REPAIR  1992   evntral hernia repair  . LOWER EXTREMITY ANGIOGRAM Bilateral 12/29/2012   Procedure: LOWER EXTREMITY ANGIOGRAM;  Surgeon: Serafina Mitchell, MD;  Location: [facility] CATH LAB;  Service: Cardiovascular;  Laterality: Bilateral;  bilat lower extrem angio     Current Outpatient  Prescriptions  Medication Sig Dispense Refill  . albuterol (PROVENTIL HFA;VENTOLIN HFA) 108 (90 BASE) MCG/ACT inhaler Inhale 2 puffs into the lungs every 6 (six) hours as needed for wheezing or shortness of breath.     Marland Kitchen albuterol (PROVENTIL) (2.5 MG/3ML) 0.083% nebulizer solution Take 3 mLs (2.5 mg total) by nebulization every 4 (four) hours as needed for wheezing or shortness of breath. 75 mL 0  . alfuzosin (UROXATRAL) 10 MG 24 hr tablet Take 10 mg by mouth at bedtime.    Marland Kitchen Apple Cid Vn-Grn Tea-Bit Or-Cr (APPLE CIDER VINEGAR PLUS PO) Take 1 capsule by mouth at bedtime.     Marland Kitchen aspirin EC 81 MG tablet Take 81 mg by mouth at bedtime.     . budesonide-formoterol (SYMBICORT) 160-4.5 MCG/ACT inhaler Inhale 2 puffs into the lungs 2 (two) times daily.    . Fluticasone-Umeclidin-Vilant 100-62.5-25 MCG/INH AEPB Inhale 1 Dose into the lungs daily.    . furosemide (LASIX) 40 MG tablet Take 1 tablet (40 mg total) by mouth daily. 30 tablet 11  . glimepiride (AMARYL) 2 MG tablet Take 2 mg by mouth at bedtime.     . isosorbide mononitrate (IMDUR) 60 MG 24 hr tablet Take 60 mg by mouth at bedtime.    Marland Kitchen levothyroxine (SYNTHROID, LEVOTHROID) 125 MCG tablet Take 250 mcg by mouth daily before breakfast.     . loperamide (IMODIUM) 2 MG capsule Take 2 mg by mouth as needed for diarrhea or loose stools.    . meloxicam (MOBIC) 15 MG tablet Take 15 mg by mouth at bedtime.     . metoprolol succinate (TOPROL-XL) 50 MG 24 hr tablet Take 50 mg by mouth at bedtime. Take with or immediately following a meal.    . nitroGLYCERIN (NITROSTAT) 0.4 MG SL tablet Place 0.4 mg under the tongue every 5 (five) minutes x 3 doses as needed for chest pain. For chest pain.    . pantoprazole (PROTONIX) 40 MG tablet Take 40 mg by mouth 2 (two) times daily.     . rosuvastatin (CRESTOR) 40 MG tablet Take 1 tablet (40 mg total) by mouth at bedtime. 30 tablet 11   No current facility-administered medications for this visit.     Allergies:    Clopidogrel    Social History:  The patient  reports that he quit smoking about 53 years ago. His smoking use included Cigarettes and Cigars. He has a 7.50 pack-year smoking history. He has never used smokeless tobacco. He reports that he does not drink alcohol or use drugs.   Family History:  The patient's ***family history includes Asthma in his sister; Breast cancer in his sister; Diabetes in his brother, mother, and sister; Heart attack in his mother and sister; Heart disease in his father, mother, and sister; Hyperlipidemia  in his mother, sister, and sister; Hypertension in his mother, sister, and sister.    ROS:  General:no colds or fevers, no weight changes Skin:no rashes or ulcers HEENT:no blurred vision, no congestion CV:see HPI PUL:see HPI GI:no diarrhea constipation or melena, no indigestion GU:no hematuria, no dysuria MS:no joint pain, no claudication Neuro:no syncope, no lightheadedness Endo:no diabetes, no thyroid disease Wt Readings from Last 3 Encounters:  12/12/16 221 lb (100.2 kg)  12/10/16 209 lb (94.8 kg)  12/05/16 221 lb 12.8 oz (100.6 kg)     PHYSICAL EXAM: VS:  There were no vitals taken for this visit. , BMI There is no height or weight on file to calculate BMI. General:Pleasant affect, NAD Skin:Warm and dry, brisk capillary refill HEENT:normocephalic, sclera clear, mucus membranes moist Neck:supple, no JVD, no bruits  Heart:S1S2 RRR without murmur, gallup, rub or click Lungs:clear without rales, rhonchi, or wheezes [id number], non tender, + BS, do not palpate liver spleen or masses Ext:no lower ext edema, 2+ pedal pulses, 2+ radial pulses Neuro:alert and oriented, MAE, follows commands, + facial symmetry    EKG:  EKG is ordered today. The ekg ordered today demonstrates ***   Recent Labs: 09/22/2016: B Natriuretic Peptide 18.3 09/23/2016: Hemoglobin 14.0 09/24/2016: Magnesium 1.9 12/05/2016: ALT 16; BUN 14; Creatinine, Ser 1.16; Platelets 137;  Potassium 3.9; Sodium 141; TSH 3.930    Lipid Panel    Component Value Date/Time   CHOL 131 12/05/2016 0957   TRIG 74 12/05/2016 0957   HDL 39 (L) 12/05/2016 0957   CHOLHDL 3.4 12/05/2016 0957   CHOLHDL 4 11/16/2014 1124   VLDL 19.8 11/16/2014 1124   LDLCALC 77 12/05/2016 0957       Other studies Reviewed: Additional studies/ records that were reviewed today include: ***.   ASSESSMENT AND PLAN:  1.  ***   Current medicines are reviewed with the patient today.  The patient Has no concerns regarding medicines.  The following changes have been made:  See above Labs/ tests ordered today include:see above  Disposition:   FU:  see above  Signed, Cecilie Kicks, NP  12/16/2016 9:46 PM    [facility] [place], [place], [place], [place] Phone: [phone]; Fax: [phone]

## 2016-12-17 ENCOUNTER — Ambulatory Visit: Payer: Medicare HMO | Admitting: Cardiology

## 2016-12-19 ENCOUNTER — Encounter (HOSPITAL_COMMUNITY): Payer: Commercial Managed Care - HMO

## 2016-12-19 ENCOUNTER — Ambulatory Visit: Payer: Commercial Managed Care - HMO | Admitting: Family

## 2016-12-21 DIAGNOSIS — I5032 Chronic diastolic (congestive) heart failure: Secondary | ICD-10-CM | POA: Diagnosis not present

## 2016-12-24 DIAGNOSIS — J440 Chronic obstructive pulmonary disease with acute lower respiratory infection: Secondary | ICD-10-CM | POA: Diagnosis not present

## 2016-12-24 DIAGNOSIS — G4733 Obstructive sleep apnea (adult) (pediatric): Secondary | ICD-10-CM | POA: Diagnosis not present

## 2017-01-01 DIAGNOSIS — J209 Acute bronchitis, unspecified: Secondary | ICD-10-CM | POA: Diagnosis not present

## 2017-01-01 DIAGNOSIS — J449 Chronic obstructive pulmonary disease, unspecified: Secondary | ICD-10-CM | POA: Diagnosis not present

## 2017-01-14 ENCOUNTER — Ambulatory Visit (INDEPENDENT_AMBULATORY_CARE_PROVIDER_SITE_OTHER): Payer: Medicare HMO | Admitting: Cardiology

## 2017-01-14 ENCOUNTER — Encounter: Payer: Self-pay | Admitting: Cardiology

## 2017-01-14 VITALS — BP 132/68 | HR 88 | Ht 64.0 in | Wt 215.1 lb

## 2017-01-14 DIAGNOSIS — I2510 Atherosclerotic heart disease of native coronary artery without angina pectoris: Secondary | ICD-10-CM | POA: Diagnosis not present

## 2017-01-14 DIAGNOSIS — I10 Essential (primary) hypertension: Secondary | ICD-10-CM

## 2017-01-14 DIAGNOSIS — I5032 Chronic diastolic (congestive) heart failure: Secondary | ICD-10-CM | POA: Diagnosis not present

## 2017-01-14 DIAGNOSIS — Z79899 Other long term (current) drug therapy: Secondary | ICD-10-CM | POA: Diagnosis not present

## 2017-01-14 DIAGNOSIS — N289 Disorder of kidney and ureter, unspecified: Secondary | ICD-10-CM | POA: Diagnosis not present

## 2017-01-14 DIAGNOSIS — R600 Localized edema: Secondary | ICD-10-CM

## 2017-01-14 MED ORDER — FUROSEMIDE 40 MG PO TABS: 80.0000 mg | ORAL_TABLET | Freq: Every evening | ORAL | 3 refills | Status: DC

## 2017-01-14 NOTE — Patient Instructions (Addendum)
Medication Instructions:  1. CONTINUE THE LASIX 80 MG EVERY EVENING AS YOUR CURRENTLY DOING   Labwork: TODAY BMET  Testing/Procedures: NONE ORDERED  Follow-Up: KEEP YOUR APPT WITH DR. Radford Pax AS PLANNED   Any Other Special Instructions Will Be Listed Below (If Applicable).  MONITOR WEIGHT DAILY AND CALL WITH WEIGHT READINGS ON Friday 01/17/17 Lenice Llamas, RN OR TRIAGE NURSE: [phone]   If you need a refill on your cardiac medications before your next appointment, please call your pharmacy.

## 2017-01-14 NOTE — Progress Notes (Signed)
Cardiology Office Note   Date:  01/14/2017   ID:  [name], DOB [birth year], MRN [id number]  PCP:  [name], MD  Cardiologist:  Dr. Radford Pax    Chief Complaint  Patient presents with  . Congestive Heart Failure      History of Present Illness: [name] is a 83 y.o. male who presents for results of nuc and his diastolic HF.     Per Dr Radford Pax "He has a history of ASCAD, HTN, dyslipidemia, OSA on CPAP, chronic diastolic CHF and obesity who presents today for followup. He was hospitalized in December with acute bronchitis and sepsis syndrome with secondary acute on chronic diastolic CHF.  He was diuresed and is doing much better. He has been having chest pain recently.  He says that it occurs once weekly and usually lasts up to 15 minutes. He has not used any SL NTG.  He denies any SOB, nausea or diaphoresis with the pain.  This is different from his typical anginal pain.  He thinks it is related to indigestion.  He denies any dizziness, palpitations or syncope. He has chronic DOE which is stable with no change. He has some chronic LLE edema which is the leg he had the DVT in the past. he says that the swelling is worse than it was in the past. He tolerates the CPAP and full face mask well. He feels the pressure is adequate. He feels rested in the am. He sleeps in the chair at night some because he cannot get comfortable in bed."    December his dry weight was 208 to 210lbs.  He is 221lbs today lasix increased for BID for 3 days.  NEEDS BMET  Today wt is down to 215 and he does feel better.  His baseline is sleeping on a wedge and pillows.  He also tells me he takes the lasix at night because if he does during the day he has urinary leakage.  So some nights he does not get to sleep until 4 AM.  He has continued the lasix 40 2 tabs every night since last visit.  He has stopped his salt.  We reviewed his nuc study which is neg for ischemia. He has not had any more chest pain,  may have been related to volume overload.    He is unable to wear support stockings -he cannot put on.    Past Medical History:  Diagnosis Date  . Anxiety   . Asthma   . Chronic diastolic CHF (congestive heart failure) (HCC)    diastolic   . COPD (chronic obstructive pulmonary disease) ([place])   . Coronary artery disease    s/p PCI of RCA 03/2009 at which time there was 70% ramus and 90% RCA, cath 01/2011 showed patent stents in RCA with moderate prox disesae, aneurysmal left circ and small 90% ramus s/p PCI, patent LAD EF 55%  . Diabetes mellitus   . Diverticulitis Oct. 2013   bleeding in the past and Effient for his CAD was stopped  . DVT (deep venous thrombosis) ([place])   . GERD (gastroesophageal reflux disease)   . Hypercholesterolemia   . Hypertension   . Hypothyroidism   . Obesity (BMI 30-39.9)   . OSA (obstructive sleep apnea)    severe on CPAP  . Peripheral vascular disease ([place]) 11/2006   s/p left stent  . Shortness of breath   . Sleep apnea    severe OSA awaiting CPAP titration    Past  Surgical History:  Procedure Laterality Date  . ABDOMINAL AORTAGRAM N/A 04/20/2012   Procedure: ABDOMINAL Maxcine Ham;  Surgeon: Angelia Mould, MD;  Location: [facility] CATH LAB;  Service: Cardiovascular;  Laterality: N/A;  . ABDOMINAL AORTAGRAM N/A 12/29/2012   Procedure: ABDOMINAL Maxcine Ham;  Surgeon: Serafina Mitchell, MD;  Location: [facility] CATH LAB;  Service: Cardiovascular;  Laterality: N/A;  . BALLOON DILATION  12/26/2011   Procedure: BALLOON DILATION;  Surgeon: Garlan Fair, MD;  Location: [facility] ENDOSCOPY;  Service: Endoscopy;  Laterality: N/A;  . COLONOSCOPY  08/14/2012   Procedure: COLONOSCOPY;  Surgeon: Arta Silence, MD;  Location: [facility] ENDOSCOPY;  Service: Endoscopy;  Laterality: N/A;  . CORONARY STENT PLACEMENT  2010 and 2012  . ESOPHAGOGASTRODUODENOSCOPY  12/26/2011   Procedure: ESOPHAGOGASTRODUODENOSCOPY (EGD);  Surgeon: Garlan Fair, MD;  Location: [facility] ENDOSCOPY;  Service: Endoscopy;   Laterality: N/A;  . ESOPHAGOGASTRODUODENOSCOPY  08/13/2012   Procedure: ESOPHAGOGASTRODUODENOSCOPY (EGD);  Surgeon: Arta Silence, MD;  Location: [facility] ENDOSCOPY;  Service: Endoscopy;  Laterality: Left;  . HERNIA REPAIR  1992   evntral hernia repair  . LOWER EXTREMITY ANGIOGRAM Bilateral 12/29/2012   Procedure: LOWER EXTREMITY ANGIOGRAM;  Surgeon: Serafina Mitchell, MD;  Location: [facility] CATH LAB;  Service: Cardiovascular;  Laterality: Bilateral;  bilat lower extrem angio     Current Outpatient Prescriptions  Medication Sig Dispense Refill  . albuterol (PROVENTIL HFA;VENTOLIN HFA) 108 (90 BASE) MCG/ACT inhaler Inhale 2 puffs into the lungs every 6 (six) hours as needed for wheezing or shortness of breath.     Marland Kitchen albuterol (PROVENTIL) (2.5 MG/3ML) 0.083% nebulizer solution Take 3 mLs (2.5 mg total) by nebulization every 4 (four) hours as needed for wheezing or shortness of breath. 75 mL 0  . alfuzosin (UROXATRAL) 10 MG 24 hr tablet Take 10 mg by mouth at bedtime.    Marland Kitchen Apple Cid Vn-Grn Tea-Bit Or-Cr (APPLE CIDER VINEGAR PLUS PO) Take 1 capsule by mouth at bedtime.     Marland Kitchen aspirin EC 81 MG tablet Take 81 mg by mouth at bedtime.     . budesonide-formoterol (SYMBICORT) 160-4.5 MCG/ACT inhaler Inhale 2 puffs into the lungs 2 (two) times daily.    . Fluticasone-Umeclidin-Vilant 100-62.5-25 MCG/INH AEPB Inhale 1 Dose into the lungs daily.    Marland Kitchen glimepiride (AMARYL) 2 MG tablet Take 2 mg by mouth at bedtime.     . isosorbide mononitrate (IMDUR) 60 MG 24 hr tablet Take 60 mg by mouth at bedtime.    Marland Kitchen levothyroxine (SYNTHROID, LEVOTHROID) 125 MCG tablet Take 250 mcg by mouth daily before breakfast.     . loperamide (IMODIUM) 2 MG capsule Take 2 mg by mouth as needed for diarrhea or loose stools (Take as directed).     . meloxicam (MOBIC) 15 MG tablet Take 15 mg by mouth at bedtime.     . metoprolol succinate (TOPROL-XL) 50 MG 24 hr tablet Take 50 mg by mouth at bedtime. Take with or immediately following a meal.      . nitroGLYCERIN (NITROSTAT) 0.4 MG SL tablet Place 0.4 mg under the tongue every 5 (five) minutes x 3 doses as needed. For chest pain.    . pantoprazole (PROTONIX) 40 MG tablet Take 40 mg by mouth 2 (two) times daily.     . rosuvastatin (CRESTOR) 40 MG tablet Take 1 tablet (40 mg total) by mouth at bedtime. 30 tablet 11  . furosemide (LASIX) 40 MG tablet Take 2 tablets (80 mg total) by mouth every evening. 180 tablet 3  No current facility-administered medications for this visit.     Allergies:   Clopidogrel    Social History:  The patient  reports that he quit smoking about 53 years ago. His smoking use included Cigarettes and Cigars. He has a 7.50 pack-year smoking history. He has never used smokeless tobacco. He reports that he does not drink alcohol or use drugs.   Family History:  The patient's family history includes Asthma in his sister; Breast cancer in his sister; Diabetes in his brother, mother, and sister; Heart attack in his mother and sister; Heart disease in his father, mother, and sister; Hyperlipidemia in his mother, sister, and sister; Hypertension in his mother, sister, and sister.    ROS:  General:no colds or fevers, + weight decrease. Skin:no rashes or ulcers HEENT:no blurred vision, no congestion CV:see HPI PUL:see HPI GI:no diarrhea constipation or melena, no indigestion GU:no hematuria, no dysuria MS:no joint pain, no claudication Neuro:no syncope, no lightheadedness Endo:+ diabetes, no thyroid disease  Wt Readings from Last 3 Encounters:  01/14/17 215 lb 1.9 oz (97.6 kg)  12/12/16 221 lb (100.2 kg)  12/10/16 209 lb (94.8 kg)     PHYSICAL EXAM: VS:  BP 132/68   Pulse 88   Ht 5\' 4"  (1.626 m)   Wt 215 lb 1.9 oz (97.6 kg)   BMI 36.93 kg/m  , BMI Body mass index is 36.93 kg/m. General:Pleasant affect, NAD Skin:Warm and dry, brisk capillary refill HEENT:normocephalic, sclera clear, mucus membranes moist Neck:supple, no JVD, no bruits  Heart:S1S2 RRR  without murmur, gallup, rub or click Lungs:clear without rales, rhonchi, or wheezes ENI:DPOE, non tender, + BS, do not palpate liver spleen or masses Ext:no lower ext edema, 2+ pedal pulses, 2+ radial pulses Neuro:alert and oriented, MAE, follows commands, + facial symmetry    EKG:  EKG is NOT ordered today.   Recent Labs: 09/22/2016: B Natriuretic Peptide 18.3 09/23/2016: Hemoglobin 14.0 09/24/2016: Magnesium 1.9 12/05/2016: ALT 16; BUN 14; Creatinine, Ser 1.16; Platelets 137; Potassium 3.9; Sodium 141; TSH 3.930    Lipid Panel    Component Value Date/Time   CHOL 131 12/05/2016 0957   TRIG 74 12/05/2016 0957   HDL 39 (L) 12/05/2016 0957   CHOLHDL 3.4 12/05/2016 0957   CHOLHDL 4 11/16/2014 1124   VLDL 19.8 11/16/2014 1124   LDLCALC 77 12/05/2016 0957       Other studies Reviewed: Additional studies/ records that were reviewed today include:  nuc study 12/13/16 Study Highlights     Nuclear stress EF: 50%.  There was no ST segment deviation noted during stress.  Defect 1: There is a medium defect of moderate severity present in the basal inferior and mid inferior location.  Defect 2: There is a small defect of severe severity present in the apex location.  This is a low risk study.  The left ventricular ejection fraction is mildly decreased (45-54%).   Low risk stress nuclear study with inferior and apical thinning vs small prior infarct; no ischemia; EF 50 with mild global hypokinesis and mild LVE.       ASSESSMENT AND PLAN:  1.  Chronic diastolic HF with volume overload - increasing the diuretic has improved his edema, will continue for now. Check BMET today with his renal insuff.  Walking in today less SOB.   2.  CAD with hx of PCI of RCA, on last visit with chest pain and nuc ordered that had no ischemia  His pain has since resolved. May have been due  to volume overload.  3.  HTN stble  4. Edema extremities, now with edema to just above mid calf.  Pt  cannot wear support stocikings    Current medicines are reviewed with the patient today.  The patient Has no concerns regarding medicines.  The following changes have been made:  See above Labs/ tests ordered today include:see above  Disposition:   FU:  see above  Signed, Cecilie Kicks, NP  01/14/2017 5:18 PM    [facility] [place], [place], [place] Phone: [phone]; Fax: [phone]

## 2017-01-15 LAB — BASIC METABOLIC PANEL
BUN/Creatinine Ratio: 19 (ref 10–24)
BUN: 21 mg/dL (ref 8–27)
CALCIUM: 9.3 mg/dL (ref 8.6–10.2)
CHLORIDE: 99 mmol/L (ref 96–106)
CO2: 31 mmol/L — ABNORMAL HIGH (ref 18–29)
Creatinine, Ser: 1.11 mg/dL (ref 0.76–1.27)
GFR calc Af Amer: 70 mL/min/{1.73_m2} (ref >59)
GFR, EST NON AFRICAN AMERICAN: 61 mL/min/{1.73_m2} (ref >59)
Glucose: 124 mg/dL — ABNORMAL HIGH (ref 65–99)
POTASSIUM: 4.7 mmol/L (ref 3.5–5.2)
Sodium: 145 mmol/L — ABNORMAL HIGH (ref 134–144)

## 2017-01-16 DIAGNOSIS — E039 Hypothyroidism, unspecified: Secondary | ICD-10-CM | POA: Diagnosis not present

## 2017-01-21 DIAGNOSIS — I5032 Chronic diastolic (congestive) heart failure: Secondary | ICD-10-CM | POA: Diagnosis not present

## 2017-01-24 DIAGNOSIS — J440 Chronic obstructive pulmonary disease with acute lower respiratory infection: Secondary | ICD-10-CM | POA: Diagnosis not present

## 2017-01-24 DIAGNOSIS — G4733 Obstructive sleep apnea (adult) (pediatric): Secondary | ICD-10-CM | POA: Diagnosis not present

## 2017-02-11 ENCOUNTER — Other Ambulatory Visit: Payer: Self-pay | Admitting: Cardiology

## 2017-02-19 ENCOUNTER — Encounter: Payer: Self-pay | Admitting: Cardiology

## 2017-02-20 DIAGNOSIS — I5032 Chronic diastolic (congestive) heart failure: Secondary | ICD-10-CM | POA: Diagnosis not present

## 2017-02-23 DIAGNOSIS — J440 Chronic obstructive pulmonary disease with acute lower respiratory infection: Secondary | ICD-10-CM | POA: Diagnosis not present

## 2017-02-23 DIAGNOSIS — G4733 Obstructive sleep apnea (adult) (pediatric): Secondary | ICD-10-CM | POA: Diagnosis not present

## 2017-03-03 DIAGNOSIS — E782 Mixed hyperlipidemia: Secondary | ICD-10-CM | POA: Diagnosis not present

## 2017-03-03 DIAGNOSIS — E039 Hypothyroidism, unspecified: Secondary | ICD-10-CM | POA: Diagnosis not present

## 2017-03-03 DIAGNOSIS — I5030 Unspecified diastolic (congestive) heart failure: Secondary | ICD-10-CM | POA: Diagnosis not present

## 2017-03-03 DIAGNOSIS — J449 Chronic obstructive pulmonary disease, unspecified: Secondary | ICD-10-CM | POA: Diagnosis not present

## 2017-03-03 DIAGNOSIS — D696 Thrombocytopenia, unspecified: Secondary | ICD-10-CM | POA: Diagnosis not present

## 2017-03-03 DIAGNOSIS — E1159 Type 2 diabetes mellitus with other circulatory complications: Secondary | ICD-10-CM | POA: Diagnosis not present

## 2017-03-03 DIAGNOSIS — I2510 Atherosclerotic heart disease of native coronary artery without angina pectoris: Secondary | ICD-10-CM | POA: Diagnosis not present

## 2017-03-03 DIAGNOSIS — I110 Hypertensive heart disease with heart failure: Secondary | ICD-10-CM | POA: Diagnosis not present

## 2017-03-10 NOTE — Progress Notes (Signed)
Cardiology Office Note    Date:  03/11/2017   ID:  [name], DOB 12/23/1929, MRN [id number]  PCP:  [name], MD  Cardiologist:  Fransico Him, MD   No chief complaint on file.   History of Present Illness:  [name] is a 83 y.o. male has a history of ASCAD, HTN, dyslipidemia, OSA on CPAP, chronic diastolic CHF and obesity. He was hospitalized in December with acute bronchitis and sepsis syndrome with secondary acute on chronic diastolic CHF. He was diuresed and is doing much better.   He is here for followup today and is doing well.  He denies any dizziness, palpitations or syncope. He has chronic LE edema which is much worse.  Unfortunately he continues to eat his meals out. He is unable to wear support stockings -he cannot put on.  He tolerates the CPAP and full face mask well and uses it nightly. He feels the pressure is adequate. He feels rested in the am. He sleeps in the chair at night some nights because he cannot get comfortable in bed. His baseline is sleeping on a wedge and pillows.  Nuc study done recently for atypical CP was neg for ischemia. He has not had any more chest pain, and may have been related to volume overload.     Past Medical History:  Diagnosis Date  . Anxiety   . Asthma   . Chronic diastolic CHF (congestive heart failure) (HCC)    diastolic   . COPD (chronic obstructive pulmonary disease) ([place])   . Coronary artery disease    s/p PCI of RCA 03/2009 at which time there was 70% ramus and 90% RCA, cath 01/2011 showed patent stents in RCA with moderate prox disesae, aneurysmal left circ and small 90% ramus s/p PCI, patent LAD EF 55%  . Diabetes mellitus   . Diverticulitis Oct. 2013   bleeding in the past and Effient for his CAD was stopped  . DVT (deep venous thrombosis) ([place])   . GERD (gastroesophageal reflux disease)   . Hypercholesterolemia   . Hypertension   . Hypothyroidism   . Obesity (BMI 30-39.9)   . OSA (obstructive sleep  apnea)    severe on CPAP  . Peripheral vascular disease ([place]) 11/2006   s/p left stent  . Shortness of breath   . Sleep apnea    severe OSA awaiting CPAP titration    Past Surgical History:  Procedure Laterality Date  . ABDOMINAL AORTAGRAM N/A 04/20/2012   Procedure: ABDOMINAL Maxcine Ham;  Surgeon: Angelia Mould, MD;  Location: [facility] CATH LAB;  Service: Cardiovascular;  Laterality: N/A;  . ABDOMINAL AORTAGRAM N/A 12/29/2012   Procedure: ABDOMINAL Maxcine Ham;  Surgeon: Serafina Mitchell, MD;  Location: [facility] CATH LAB;  Service: Cardiovascular;  Laterality: N/A;  . BALLOON DILATION  12/26/2011   Procedure: BALLOON DILATION;  Surgeon: Garlan Fair, MD;  Location: [facility] ENDOSCOPY;  Service: Endoscopy;  Laterality: N/A;  . COLONOSCOPY  08/14/2012   Procedure: COLONOSCOPY;  Surgeon: Arta Silence, MD;  Location: [facility] ENDOSCOPY;  Service: Endoscopy;  Laterality: N/A;  . CORONARY STENT PLACEMENT  2010 and 2012  . ESOPHAGOGASTRODUODENOSCOPY  12/26/2011   Procedure: ESOPHAGOGASTRODUODENOSCOPY (EGD);  Surgeon: Garlan Fair, MD;  Location: [facility] ENDOSCOPY;  Service: Endoscopy;  Laterality: N/A;  . ESOPHAGOGASTRODUODENOSCOPY  08/13/2012   Procedure: ESOPHAGOGASTRODUODENOSCOPY (EGD);  Surgeon: Arta Silence, MD;  Location: [facility] ENDOSCOPY;  Service: Endoscopy;  Laterality: Left;  . HERNIA REPAIR  1992   evntral hernia repair  .  LOWER EXTREMITY ANGIOGRAM Bilateral 12/29/2012   Procedure: LOWER EXTREMITY ANGIOGRAM;  Surgeon: Serafina Mitchell, MD;  Location: [facility] CATH LAB;  Service: Cardiovascular;  Laterality: Bilateral;  bilat lower extrem angio    Current Medications: Current Meds  Medication Sig  . albuterol (PROVENTIL HFA;VENTOLIN HFA) 108 (90 BASE) MCG/ACT inhaler Inhale 2 puffs into the lungs every 6 (six) hours as needed for wheezing or shortness of breath.   Marland Kitchen albuterol (PROVENTIL) (2.5 MG/3ML) 0.083% nebulizer solution Take 3 mLs (2.5 mg total) by nebulization every 4 (four) hours as needed for wheezing  or shortness of breath.  . alfuzosin (UROXATRAL) 10 MG 24 hr tablet Take 10 mg by mouth at bedtime.  Marland Kitchen Apple Cid Vn-Grn Tea-Bit Or-Cr (APPLE CIDER VINEGAR PLUS PO) Take 1 capsule by mouth at bedtime.   Marland Kitchen aspirin EC 81 MG tablet Take 81 mg by mouth at bedtime.   . budesonide-formoterol (SYMBICORT) 160-4.5 MCG/ACT inhaler Inhale 2 puffs into the lungs 2 (two) times daily.  . Fluticasone-Umeclidin-Vilant 100-62.5-25 MCG/INH AEPB Inhale 1 Dose into the lungs daily.  . furosemide (LASIX) 40 MG tablet Take 2 tablets (80 mg total) by mouth every evening.  Marland Kitchen glimepiride (AMARYL) 2 MG tablet Take 2 mg by mouth at bedtime.   . isosorbide mononitrate (IMDUR) 60 MG 24 hr tablet Take 60 mg by mouth at bedtime.  Marland Kitchen levothyroxine (SYNTHROID, LEVOTHROID) 125 MCG tablet Take 250 mcg by mouth daily before breakfast.   . loperamide (IMODIUM) 2 MG capsule Take 2 mg by mouth as needed for diarrhea or loose stools (Take as directed).   . meloxicam (MOBIC) 15 MG tablet Take 15 mg by mouth at bedtime.   . metoprolol succinate (TOPROL-XL) 50 MG 24 hr tablet Take 50 mg by mouth at bedtime. Take with or immediately following a meal.  . metoprolol succinate (TOPROL-XL) 50 MG 24 hr tablet TAKE 1 TABLET BY MOUTH EVERY DAY  . nitroGLYCERIN (NITROSTAT) 0.4 MG SL tablet Place 0.4 mg under the tongue every 5 (five) minutes x 3 doses as needed. For chest pain.  . pantoprazole (PROTONIX) 40 MG tablet Take 40 mg by mouth 2 (two) times daily.   . rosuvastatin (CRESTOR) 40 MG tablet Take 1 tablet (40 mg total) by mouth at bedtime.    Allergies:   Clopidogrel   Social History   Social History  . Marital status: Widowed    Spouse name: N/A  . Number of children: N/A  . Years of education: N/A   Social History Main Topics  . Smoking status: Former Smoker    Packs/day: 0.50    Years: 15.00    Types: Cigarettes, Cigars    Quit date: 10/15/1963  . Smokeless tobacco: Never Used  . Alcohol use No  . Drug use: No  . Sexual  activity: No   Other Topics Concern  . None   Social History Narrative  . None     Family History:  The patient's family history includes Asthma in his sister; Breast cancer in his sister; Diabetes in his brother, mother, and sister; Heart attack in his mother and sister; Heart disease in his father, mother, and sister; Hyperlipidemia in his mother, sister, and sister; Hypertension in his mother, sister, and sister.   ROS:   Please see the history of present illness.    ROS All other systems reviewed and are negative.  No flowsheet data found.     PHYSICAL EXAM:   VS:  BP 138/74   Pulse 79  Ht 5\' 4"  (1.626 m)   Wt 218 lb 3.2 oz (99 kg)   BMI 37.45 kg/m    GEN: Well nourished, well developed, in no acute distress  HEENT: normal  Neck: no JVD, carotid bruits, or masses Cardiac: RRR; no murmurs, rubs, or gallops,no edema.  Intact distal pulses bilaterally.  Respiratory:  clear to auscultation bilaterally, normal work of breathing GI: soft, nontender, nondistended, + BS MS: no deformity or atrophy  Skin: warm and dry, no rash Neuro:  Alert and Oriented x 3, Strength and sensation are intact Psych: euthymic mood, full affect  Wt Readings from Last 3 Encounters:  03/11/17 218 lb 3.2 oz (99 kg)  01/14/17 215 lb 1.9 oz (97.6 kg)  12/12/16 221 lb (100.2 kg)      Studies/Labs Reviewed:   EKG:  EKG is not ordered today.    Recent Labs: 09/22/2016: B Natriuretic Peptide 18.3 09/23/2016: Hemoglobin 14.0 09/24/2016: Magnesium 1.9 12/05/2016: ALT 16; Platelets 137; TSH 3.930 01/14/2017: BUN 21; Creatinine, Ser 1.11; Potassium 4.7; Sodium 145   Lipid Panel    Component Value Date/Time   CHOL 131 12/05/2016 0957   TRIG 74 12/05/2016 0957   HDL 39 (L) 12/05/2016 0957   CHOLHDL 3.4 12/05/2016 0957   CHOLHDL 4 11/16/2014 1124   VLDL 19.8 11/16/2014 1124   LDLCALC 77 12/05/2016 0957    Additional studies/ records that were reviewed today include:  none    ASSESSMENT:     1. Chronic diastolic CHF (congestive heart failure) ([place])   2. Coronary artery disease involving native coronary artery of native heart without angina pectoris   3. HYPERTENSION, BENIGN   4. OSA (obstructive sleep apnea)   5. Dyslipidemia   6. Edema extremities      PLAN:  In order of problems listed above:  1. Chronic diastolic CHF - his weight is down but he has significant LE edema which I thinks is due to poor dietary indiscretion with sodium intake.  He will continue on Lasix at current dose.   2. ASCAD s/p PCI of RCA 03/2009 at which time there was 70% ramus and 90% RCA, cath 01/2011 showed patent stents in RCA with moderate prox disesae, aneurysmal left circ and small 90% ramus s/p PCI, patent LAD EF 55%.  Nuclear stress test 12/2016 showed no ischemia.  He will continue on ASA 81mg  daily, Long acting nitrates 60mg  qhs and Toprol XL 50mg  daily.  3. HTN - his BP is adequately controlled on exam today.  He will continue on Toprol XL 50mg  daily  4. OSA - He tolerates his CPAP and is benefiting from it.  I will get a download from the DME.  5. Dyslipidemia with LDL goal < 70.   He will continue on crestor 40mg  daily. His last LDL was 77 in 11/2016.    6. LE edema - he has marked edema on exam today due to poor dietary compliance with sodium.  He understands that by eating meals out his intake of sodium will be higher but he cannot cook for himself.  I have asked him to increase his Lasix to 80mg  BID for 3 days and then continue on Lasix 80mg  daily.  I instructed him to take an extra 40mg  as needed for increased edema or weight > 3lbs in 1 day.  He is unable to get compression hose on.  I  Encouraged him to keep his feet elevated when sitting during the day.      Medication Adjustments/Labs  and Tests Ordered: Current medicines are reviewed at length with the patient today.  Concerns regarding medicines are outlined above.  Medication changes, Labs and Tests ordered today are listed in  the Patient Instructions below.  There are no Patient Instructions on file for this visit.   Signed, Fransico Him, MD  03/11/2017 2:53 PM    [address] [place], [place], [place]  [address] Phone: [phone]; Fax: [phone]

## 2017-03-11 ENCOUNTER — Encounter: Payer: Self-pay | Admitting: Cardiology

## 2017-03-11 ENCOUNTER — Other Ambulatory Visit: Payer: Medicare HMO

## 2017-03-11 ENCOUNTER — Encounter (INDEPENDENT_AMBULATORY_CARE_PROVIDER_SITE_OTHER): Payer: Self-pay

## 2017-03-11 ENCOUNTER — Ambulatory Visit (INDEPENDENT_AMBULATORY_CARE_PROVIDER_SITE_OTHER): Payer: Medicare HMO | Admitting: Cardiology

## 2017-03-11 VITALS — BP 138/74 | HR 79 | Ht 64.0 in | Wt 218.2 lb

## 2017-03-11 DIAGNOSIS — R600 Localized edema: Secondary | ICD-10-CM | POA: Diagnosis not present

## 2017-03-11 DIAGNOSIS — I5032 Chronic diastolic (congestive) heart failure: Secondary | ICD-10-CM | POA: Diagnosis not present

## 2017-03-11 DIAGNOSIS — I10 Essential (primary) hypertension: Secondary | ICD-10-CM | POA: Diagnosis not present

## 2017-03-11 DIAGNOSIS — E785 Hyperlipidemia, unspecified: Secondary | ICD-10-CM | POA: Diagnosis not present

## 2017-03-11 DIAGNOSIS — G4733 Obstructive sleep apnea (adult) (pediatric): Secondary | ICD-10-CM | POA: Diagnosis not present

## 2017-03-11 DIAGNOSIS — I2510 Atherosclerotic heart disease of native coronary artery without angina pectoris: Secondary | ICD-10-CM

## 2017-03-11 NOTE — Patient Instructions (Signed)
Medication Instructions:   Increase lasix (furosemide) to 80 mg two times a day for 3 days, then decrease lasix (furosemide) back to 80 mg daily.  If your weight increases more than 3 pounds in 24 hours or you have an  increase in lower extremity edema (swelling in your feet and legs) Dr Radford Pax wants you to take an extra lasix (furosemide) 40 mg. If you take extra lasix and your weight does not decrease or your lower extremity edema does not get better with extra lasix (furosemide) call our office--[phone]  Labwork: None   Testing/Procedures: None  Follow-Up: Your physician recommends that you schedule a follow-up appointment in: 3 months with Dr Radford Pax.   Any Other Special Instructions Will Be Listed Below (If Applicable).   Low-Sodium Eating Plan Sodium, which is an element that makes up salt, helps you maintain a healthy balance of fluids in your body. Too much sodium can increase your blood pressure and cause fluid and waste to be held in your body. Your health care provider or dietitian may recommend following this plan if you have high blood pressure (hypertension), kidney disease, liver disease, or heart failure. Eating less sodium can help lower your blood pressure, reduce swelling, and protect your heart, liver, and kidneys. What are tips for following this plan? General guidelines   Most people on this plan should limit their sodium intake to 1,500-2,000 mg (milligrams) of sodium each day. Reading food labels   The Nutrition Facts label lists the amount of sodium in one serving of the food. If you eat more than one serving, you must multiply the listed amount of sodium by the number of servings.  Choose foods with less than 140 mg of sodium per serving.  Avoid foods with 300 mg of sodium or more per serving. Shopping   Look for lower-sodium products, often labeled as "low-sodium" or "no salt added."  Always check the sodium content even if foods are labeled as  "unsalted" or "no salt added".  Buy fresh foods.  Avoid canned foods and premade or frozen meals.  Avoid canned, cured, or processed meats  Buy breads that have less than 80 mg of sodium per slice. Cooking   Eat more home-cooked food and less restaurant, buffet, and fast food.  Avoid adding salt when cooking. Use salt-free seasonings or herbs instead of table salt or sea salt. Check with your health care provider or pharmacist before using salt substitutes.  Cook with plant-based oils, such as canola, sunflower, or olive oil. Meal planning   When eating at a restaurant, ask that your food be prepared with less salt or no salt, if possible.  Avoid foods that contain MSG (monosodium glutamate). MSG is sometimes added to [place] food, bouillon, and some canned foods. What foods are recommended? The items listed may not be a complete list. Talk with your dietitian about what dietary choices are best for you. Grains  Low-sodium cereals, including oats, puffed wheat and rice, and shredded wheat. Low-sodium crackers. Unsalted rice. Unsalted pasta. Low-sodium bread. Whole-grain breads and whole-grain pasta. Vegetables  Fresh or frozen vegetables. "No salt added" canned vegetables. "No salt added" tomato sauce and paste. Low-sodium or reduced-sodium tomato and vegetable juice. Fruits  Fresh, frozen, or canned fruit. Fruit juice. Meats and other protein foods  Fresh or frozen (no salt added) meat, poultry, seafood, and fish. Low-sodium canned tuna and salmon. Unsalted nuts. Dried peas, beans, and lentils without added salt. Unsalted canned beans. Eggs. Unsalted nut butters. Dairy  Milk. Soy milk. Cheese that is naturally low in sodium, such as ricotta cheese, fresh mozzarella, or Swiss cheese Low-sodium or reduced-sodium cheese. Cream cheese. Yogurt. Fats and oils  Unsalted butter. Unsalted margarine with no trans fat. Vegetable oils such as canola or olive oils. Seasonings and other foods    Fresh and dried herbs and spices. Salt-free seasonings. Low-sodium mustard and ketchup. Sodium-free salad dressing. Sodium-free light mayonnaise. Fresh or refrigerated horseradish. Lemon juice. Vinegar. Homemade, reduced-sodium, or low-sodium soups. Unsalted popcorn and pretzels. Low-salt or salt-free chips. What foods are not recommended? The items listed may not be a complete list. Talk with your dietitian about what dietary choices are best for you. Grains  Instant hot cereals. Bread stuffing, pancake, and biscuit mixes. Croutons. Seasoned rice or pasta mixes. Noodle soup cups. Boxed or frozen macaroni and cheese. Regular salted crackers. Self-rising flour. Vegetables  Sauerkraut, pickled vegetables, and relishes. Olives. [place] fries. Onion rings. Regular canned vegetables (not low-sodium or reduced-sodium). Regular canned tomato sauce and paste (not low-sodium or reduced-sodium). Regular tomato and vegetable juice (not low-sodium or reduced-sodium). Frozen vegetables in sauces. Meats and other protein foods  Meat or fish that is salted, canned, smoked, spiced, or pickled. Bacon, ham, sausage, hotdogs, corned beef, chipped beef, packaged lunch meats, salt pork, jerky, pickled herring, anchovies, regular canned tuna, sardines, salted nuts. Dairy  Processed cheese and cheese spreads. Cheese curds. Blue cheese. Feta cheese. String cheese. Regular cottage cheese. Buttermilk. Canned milk. Fats and oils  Salted butter. Regular margarine. Ghee. Bacon fat. Seasonings and other foods  Onion salt, garlic salt, seasoned salt, table salt, and sea salt. Canned and packaged gravies. Worcestershire sauce. Tartar sauce. Barbecue sauce. Teriyaki sauce. Soy sauce, including reduced-sodium. Steak sauce. Fish sauce. Oyster sauce. Cocktail sauce. Horseradish that you find on the shelf. Regular ketchup and mustard. Meat flavorings and tenderizers. Bouillon cubes. Hot sauce and Tabasco sauce. Premade or packaged  marinades. Premade or packaged taco seasonings. Relishes. Regular salad dressings. Salsa. Potato and tortilla chips. Corn chips and puffs. Salted popcorn and pretzels. Canned or dried soups. Pizza. Frozen entrees and pot pies. Summary  Eating less sodium can help lower your blood pressure, reduce swelling, and protect your heart, liver, and kidneys.  Most people on this plan should limit their sodium intake to 1,500-2,000 mg (milligrams) of sodium each day.  Canned, boxed, and frozen foods are high in sodium. Restaurant foods, fast foods, and pizza are also very high in sodium. You also get sodium by adding salt to food.  Try to cook at home, eat more fresh fruits and vegetables, and eat less fast food, canned, processed, or prepared foods. This information is not intended to replace advice given to you by your health care provider. Make sure you discuss any questions you have with your health care provider. Document Released: 03/22/2002 Document Revised: 09/23/2016 Document Reviewed: 09/23/2016 Elsevier Interactive Patient Education  2017 [employer].    If you need a refill on your cardiac medications before your next appointment, please call your pharmacy.

## 2017-03-19 DIAGNOSIS — I5030 Unspecified diastolic (congestive) heart failure: Secondary | ICD-10-CM | POA: Diagnosis not present

## 2017-03-23 DIAGNOSIS — I5032 Chronic diastolic (congestive) heart failure: Secondary | ICD-10-CM | POA: Diagnosis not present

## 2017-03-26 DIAGNOSIS — G4733 Obstructive sleep apnea (adult) (pediatric): Secondary | ICD-10-CM | POA: Diagnosis not present

## 2017-03-26 DIAGNOSIS — J440 Chronic obstructive pulmonary disease with acute lower respiratory infection: Secondary | ICD-10-CM | POA: Diagnosis not present

## 2017-04-01 ENCOUNTER — Other Ambulatory Visit: Payer: Self-pay | Admitting: Cardiology

## 2017-04-22 DIAGNOSIS — I5032 Chronic diastolic (congestive) heart failure: Secondary | ICD-10-CM | POA: Diagnosis not present

## 2017-04-25 DIAGNOSIS — G4733 Obstructive sleep apnea (adult) (pediatric): Secondary | ICD-10-CM | POA: Diagnosis not present

## 2017-04-25 DIAGNOSIS — J440 Chronic obstructive pulmonary disease with acute lower respiratory infection: Secondary | ICD-10-CM | POA: Diagnosis not present

## 2017-05-13 DIAGNOSIS — G4733 Obstructive sleep apnea (adult) (pediatric): Secondary | ICD-10-CM | POA: Diagnosis not present

## 2017-05-16 ENCOUNTER — Ambulatory Visit
Admission: RE | Admit: 2017-05-16 | Discharge: 2017-05-16 | Disposition: A | Discharge status: Home / Self Care | Payer: Medicare HMO | Source: Ambulatory Visit | Attending: Family Medicine | Admitting: Family Medicine

## 2017-05-16 ENCOUNTER — Other Ambulatory Visit: Payer: Self-pay | Admitting: Family Medicine

## 2017-05-16 DIAGNOSIS — R51 Headache: Secondary | ICD-10-CM | POA: Diagnosis not present

## 2017-05-16 DIAGNOSIS — R519 Headache, unspecified: Secondary | ICD-10-CM

## 2017-05-16 DIAGNOSIS — R350 Frequency of micturition: Secondary | ICD-10-CM | POA: Diagnosis not present

## 2017-05-22 ENCOUNTER — Emergency Department (HOSPITAL_COMMUNITY): Payer: Medicare HMO

## 2017-05-22 ENCOUNTER — Encounter (HOSPITAL_COMMUNITY): Payer: Self-pay | Admitting: Emergency Medicine

## 2017-05-22 ENCOUNTER — Observation Stay (HOSPITAL_COMMUNITY)
Admission: EM | Admit: 2017-05-22 | Discharge: 2017-05-24 | Disposition: A | Discharge status: Home / Self Care | Payer: Medicare HMO | Attending: Internal Medicine | Admitting: Internal Medicine

## 2017-05-22 DIAGNOSIS — A419 Sepsis, unspecified organism: Secondary | ICD-10-CM | POA: Diagnosis not present

## 2017-05-22 DIAGNOSIS — N179 Acute kidney failure, unspecified: Secondary | ICD-10-CM | POA: Insufficient documentation

## 2017-05-22 DIAGNOSIS — Z7984 Long term (current) use of oral hypoglycemic drugs: Secondary | ICD-10-CM | POA: Diagnosis not present

## 2017-05-22 DIAGNOSIS — N183 Chronic kidney disease, stage 3 (moderate): Secondary | ICD-10-CM | POA: Insufficient documentation

## 2017-05-22 DIAGNOSIS — Z6834 Body mass index (BMI) 34.0-34.9, adult: Secondary | ICD-10-CM | POA: Insufficient documentation

## 2017-05-22 DIAGNOSIS — J441 Chronic obstructive pulmonary disease with (acute) exacerbation: Secondary | ICD-10-CM | POA: Diagnosis present

## 2017-05-22 DIAGNOSIS — R0602 Shortness of breath: Secondary | ICD-10-CM | POA: Diagnosis not present

## 2017-05-22 DIAGNOSIS — F419 Anxiety disorder, unspecified: Secondary | ICD-10-CM | POA: Insufficient documentation

## 2017-05-22 DIAGNOSIS — I10 Essential (primary) hypertension: Secondary | ICD-10-CM | POA: Diagnosis not present

## 2017-05-22 DIAGNOSIS — Z7982 Long term (current) use of aspirin: Secondary | ICD-10-CM | POA: Insufficient documentation

## 2017-05-22 DIAGNOSIS — E039 Hypothyroidism, unspecified: Secondary | ICD-10-CM | POA: Insufficient documentation

## 2017-05-22 DIAGNOSIS — E1122 Type 2 diabetes mellitus with diabetic chronic kidney disease: Secondary | ICD-10-CM | POA: Diagnosis not present

## 2017-05-22 DIAGNOSIS — Z79899 Other long term (current) drug therapy: Secondary | ICD-10-CM | POA: Insufficient documentation

## 2017-05-22 DIAGNOSIS — I5032 Chronic diastolic (congestive) heart failure: Secondary | ICD-10-CM | POA: Diagnosis present

## 2017-05-22 DIAGNOSIS — R069 Unspecified abnormalities of breathing: Secondary | ICD-10-CM | POA: Diagnosis not present

## 2017-05-22 DIAGNOSIS — I2510 Atherosclerotic heart disease of native coronary artery without angina pectoris: Secondary | ICD-10-CM | POA: Diagnosis not present

## 2017-05-22 DIAGNOSIS — Z87891 Personal history of nicotine dependence: Secondary | ICD-10-CM | POA: Insufficient documentation

## 2017-05-22 DIAGNOSIS — E1151 Type 2 diabetes mellitus with diabetic peripheral angiopathy without gangrene: Secondary | ICD-10-CM | POA: Insufficient documentation

## 2017-05-22 DIAGNOSIS — Z955 Presence of coronary angioplasty implant and graft: Secondary | ICD-10-CM | POA: Insufficient documentation

## 2017-05-22 DIAGNOSIS — K219 Gastro-esophageal reflux disease without esophagitis: Secondary | ICD-10-CM | POA: Insufficient documentation

## 2017-05-22 DIAGNOSIS — I130 Hypertensive heart and chronic kidney disease with heart failure and stage 1 through stage 4 chronic kidney disease, or unspecified chronic kidney disease: Secondary | ICD-10-CM | POA: Insufficient documentation

## 2017-05-22 DIAGNOSIS — E7800 Pure hypercholesterolemia, unspecified: Secondary | ICD-10-CM | POA: Diagnosis not present

## 2017-05-22 DIAGNOSIS — J9601 Acute respiratory failure with hypoxia: Secondary | ICD-10-CM | POA: Diagnosis not present

## 2017-05-22 DIAGNOSIS — G4733 Obstructive sleep apnea (adult) (pediatric): Secondary | ICD-10-CM | POA: Diagnosis present

## 2017-05-22 DIAGNOSIS — N189 Chronic kidney disease, unspecified: Secondary | ICD-10-CM | POA: Diagnosis present

## 2017-05-22 DIAGNOSIS — E669 Obesity, unspecified: Secondary | ICD-10-CM | POA: Diagnosis not present

## 2017-05-22 DIAGNOSIS — Z86718 Personal history of other venous thrombosis and embolism: Secondary | ICD-10-CM

## 2017-05-22 DIAGNOSIS — J440 Chronic obstructive pulmonary disease with acute lower respiratory infection: Secondary | ICD-10-CM | POA: Diagnosis not present

## 2017-05-22 DIAGNOSIS — Z9114 Patient's other noncompliance with medication regimen: Secondary | ICD-10-CM | POA: Diagnosis not present

## 2017-05-22 DIAGNOSIS — J181 Lobar pneumonia, unspecified organism: Secondary | ICD-10-CM | POA: Diagnosis not present

## 2017-05-22 DIAGNOSIS — J189 Pneumonia, unspecified organism: Secondary | ICD-10-CM | POA: Diagnosis present

## 2017-05-22 DIAGNOSIS — J449 Chronic obstructive pulmonary disease, unspecified: Secondary | ICD-10-CM | POA: Diagnosis not present

## 2017-05-22 DIAGNOSIS — N289 Disorder of kidney and ureter, unspecified: Secondary | ICD-10-CM

## 2017-05-22 LAB — I-STAT TROPONIN, ED: Troponin i, poc: 0 ng/mL (ref 0.00–0.08)

## 2017-05-22 LAB — CBC WITH DIFFERENTIAL/PLATELET
Basophils Absolute: 0 10*3/uL (ref 0.0–0.1)
Basophils Relative: 0 %
EOS ABS: 0.1 10*3/uL (ref 0.0–0.7)
Eosinophils Relative: 0 %
HEMATOCRIT: 44.5 % (ref 39.0–52.0)
HEMOGLOBIN: 14.4 g/dL (ref 13.0–17.0)
LYMPHS ABS: 1.1 10*3/uL (ref 0.7–4.0)
LYMPHS PCT: 10 %
MCH: 28.7 pg (ref 26.0–34.0)
MCHC: 32.4 g/dL (ref 30.0–36.0)
MCV: 88.6 fL (ref 78.0–100.0)
MONOS PCT: 4 %
Monocytes Absolute: 0.5 10*3/uL (ref 0.1–1.0)
NEUTROS PCT: 86 %
Neutro Abs: 10.1 10*3/uL — ABNORMAL HIGH (ref 1.7–7.7)
Platelets: 130 10*3/uL — ABNORMAL LOW (ref 150–400)
RBC: 5.02 MIL/uL (ref 4.22–5.81)
RDW: 14.1 % (ref 11.5–15.5)
WBC: 11.7 10*3/uL — AB (ref 4.0–10.5)

## 2017-05-22 LAB — BRAIN NATRIURETIC PEPTIDE: B NATRIURETIC PEPTIDE 5: 23.3 pg/mL (ref 0.0–100.0)

## 2017-05-22 LAB — URINALYSIS, ROUTINE W REFLEX MICROSCOPIC
Bacteria, UA: NONE SEEN
Bilirubin Urine: NEGATIVE
GLUCOSE, UA: NEGATIVE mg/dL
KETONES UR: NEGATIVE mg/dL
Leukocytes, UA: NEGATIVE
Nitrite: NEGATIVE
Protein, ur: NEGATIVE mg/dL
Specific Gravity, Urine: 1.014 (ref 1.005–1.030)
Squamous Epithelial / LPF: NONE SEEN
pH: 6 (ref 5.0–8.0)

## 2017-05-22 LAB — COMPREHENSIVE METABOLIC PANEL
ALT: 21 U/L (ref 17–63)
ANION GAP: 8 (ref 5–15)
AST: 31 U/L (ref 15–41)
Albumin: 4.6 g/dL (ref 3.5–5.0)
Alkaline Phosphatase: 43 U/L (ref 38–126)
BUN: 21 mg/dL — ABNORMAL HIGH (ref 6–20)
CHLORIDE: 99 mmol/L — AB (ref 101–111)
CO2: 31 mmol/L (ref 22–32)
Calcium: 9.4 mg/dL (ref 8.9–10.3)
Creatinine, Ser: 1.41 mg/dL — ABNORMAL HIGH (ref 0.61–1.24)
GFR, EST AFRICAN AMERICAN: 51 mL/min — AB (ref >60)
GFR, EST NON AFRICAN AMERICAN: 44 mL/min — AB (ref >60)
Glucose, Bld: 147 mg/dL — ABNORMAL HIGH (ref 65–99)
POTASSIUM: 4 mmol/L (ref 3.5–5.1)
SODIUM: 138 mmol/L (ref 135–145)
Total Bilirubin: 1.1 mg/dL (ref 0.3–1.2)
Total Protein: 7.9 g/dL (ref 6.5–8.1)

## 2017-05-22 LAB — D-DIMER, QUANTITATIVE: D-Dimer, Quant: 2.97 ug/mL-FEU — ABNORMAL HIGH (ref 0.00–0.50)

## 2017-05-22 LAB — GLUCOSE, CAPILLARY: Glucose-Capillary: 166 mg/dL — ABNORMAL HIGH (ref 65–99)

## 2017-05-22 LAB — I-STAT CG4 LACTIC ACID, ED: LACTIC ACID, VENOUS: 1.84 mmol/L (ref 0.5–1.9)

## 2017-05-22 MED ORDER — MOMETASONE FURO-FORMOTEROL FUM 200-5 MCG/ACT IN AERO
2.0000 | INHALATION_SPRAY | Freq: Two times a day (BID) | RESPIRATORY_TRACT | Status: DC
  Administered 2017-05-22 – 2017-05-24 (×4): 2 via RESPIRATORY_TRACT
  Filled 2017-05-22: qty 8.8

## 2017-05-22 MED ORDER — METHYLPREDNISOLONE SODIUM SUCC 125 MG IJ SOLR
125.0000 mg | Freq: Once | INTRAMUSCULAR | Status: AC
  Administered 2017-05-22: 125 mg via INTRAVENOUS
  Filled 2017-05-22: qty 2

## 2017-05-22 MED ORDER — PREDNISONE 20 MG PO TABS
40.0000 mg | ORAL_TABLET | Freq: Every day | ORAL | Status: DC
  Administered 2017-05-23: 40 mg via ORAL
  Filled 2017-05-22: qty 2

## 2017-05-22 MED ORDER — SODIUM CHLORIDE 0.9 % IV BOLUS (SEPSIS)
1000.0000 mL | Freq: Once | INTRAVENOUS | Status: AC
  Administered 2017-05-22: 1000 mL via INTRAVENOUS

## 2017-05-22 MED ORDER — SODIUM CHLORIDE 0.45 % IV SOLN
INTRAVENOUS | Status: DC
  Administered 2017-05-22 – 2017-05-23 (×2): via INTRAVENOUS

## 2017-05-22 MED ORDER — DEXTROSE 5 % IV SOLN
500.0000 mg | Freq: Once | INTRAVENOUS | Status: AC
  Administered 2017-05-22: 500 mg via INTRAVENOUS
  Filled 2017-05-22: qty 500

## 2017-05-22 MED ORDER — DEXTROSE 5 % IV SOLN
1.0000 g | Freq: Once | INTRAVENOUS | Status: AC
  Administered 2017-05-22: 1 g via INTRAVENOUS
  Filled 2017-05-22: qty 10

## 2017-05-22 MED ORDER — ACETAMINOPHEN 500 MG PO TABS
1000.0000 mg | ORAL_TABLET | Freq: Once | ORAL | Status: AC
  Administered 2017-05-22: 1000 mg via ORAL
  Filled 2017-05-22: qty 2

## 2017-05-22 MED ORDER — LEVOTHYROXINE SODIUM 150 MCG PO TABS
150.0000 ug | ORAL_TABLET | Freq: Every day | ORAL | Status: DC
  Administered 2017-05-23 – 2017-05-24 (×2): 150 ug via ORAL
  Filled 2017-05-22 (×2): qty 1

## 2017-05-22 MED ORDER — ROSUVASTATIN CALCIUM 10 MG PO TABS
40.0000 mg | ORAL_TABLET | Freq: Every day | ORAL | Status: DC
  Administered 2017-05-22 – 2017-05-23 (×2): 40 mg via ORAL
  Filled 2017-05-22 (×2): qty 4

## 2017-05-22 MED ORDER — INSULIN ASPART 100 UNIT/ML ~~LOC~~ SOLN
0.0000 [IU] | Freq: Three times a day (TID) | SUBCUTANEOUS | Status: DC
Start: 2017-05-23 — End: 2017-05-24
  Administered 2017-05-23: 1 [IU] via SUBCUTANEOUS
  Administered 2017-05-23: 2 [IU] via SUBCUTANEOUS
  Administered 2017-05-23: 1 [IU] via SUBCUTANEOUS
  Administered 2017-05-24: 2 [IU] via SUBCUTANEOUS

## 2017-05-22 MED ORDER — DEXTROSE 5 % IV SOLN
500.0000 mg | INTRAVENOUS | Status: DC
  Administered 2017-05-23: 500 mg via INTRAVENOUS
  Filled 2017-05-22 (×2): qty 500

## 2017-05-22 MED ORDER — ENSURE ENLIVE PO LIQD: 237.0000 mL | Freq: Two times a day (BID) | ORAL | Status: DC

## 2017-05-22 MED ORDER — ASPIRIN EC 81 MG PO TBEC
81.0000 mg | DELAYED_RELEASE_TABLET | Freq: Every day | ORAL | Status: DC
  Administered 2017-05-23 – 2017-05-24 (×2): 81 mg via ORAL
  Filled 2017-05-22 (×2): qty 1

## 2017-05-22 MED ORDER — ALFUZOSIN HCL ER 10 MG PO TB24
10.0000 mg | ORAL_TABLET | Freq: Every day | ORAL | Status: DC
  Administered 2017-05-23 – 2017-05-24 (×2): 10 mg via ORAL
  Filled 2017-05-22 (×2): qty 1

## 2017-05-22 MED ORDER — HEPARIN SODIUM (PORCINE) 5000 UNIT/ML IJ SOLN
5000.0000 [IU] | Freq: Three times a day (TID) | INTRAMUSCULAR | Status: DC
  Administered 2017-05-22 – 2017-05-24 (×5): 5000 [IU] via SUBCUTANEOUS
  Filled 2017-05-22 (×5): qty 1

## 2017-05-22 MED ORDER — PANTOPRAZOLE SODIUM 40 MG PO TBEC
40.0000 mg | DELAYED_RELEASE_TABLET | Freq: Every day | ORAL | Status: DC
  Administered 2017-05-23 – 2017-05-24 (×2): 40 mg via ORAL
  Filled 2017-05-22 (×2): qty 1

## 2017-05-22 MED ORDER — METOPROLOL SUCCINATE ER 50 MG PO TB24
50.0000 mg | ORAL_TABLET | Freq: Every day | ORAL | Status: DC
  Administered 2017-05-22 – 2017-05-23 (×2): 50 mg via ORAL
  Filled 2017-05-22 (×2): qty 1

## 2017-05-22 MED ORDER — ISOSORBIDE MONONITRATE ER 60 MG PO TB24
60.0000 mg | ORAL_TABLET | Freq: Every day | ORAL | Status: DC
  Administered 2017-05-23 – 2017-05-24 (×2): 60 mg via ORAL
  Filled 2017-05-22 (×2): qty 1

## 2017-05-22 MED ORDER — IPRATROPIUM-ALBUTEROL 0.5-2.5 (3) MG/3ML IN SOLN
3.0000 mL | Freq: Once | RESPIRATORY_TRACT | Status: AC
  Administered 2017-05-22: 3 mL via RESPIRATORY_TRACT
  Filled 2017-05-22: qty 3

## 2017-05-22 MED ORDER — DEXTROSE 5 % IV SOLN
1.0000 g | INTRAVENOUS | Status: DC
  Administered 2017-05-23: 1 g via INTRAVENOUS
  Filled 2017-05-22 (×2): qty 10

## 2017-05-22 NOTE — ED Notes (Signed)
ED TO INPATIENT HANDOFF REPORT  Name/Age/Gender [name] 83 y.o. male  Home/SNF/Other Home  Chief Complaint SOB   Code Status History    Date Active Date Inactive Code Status Order ID Comments User Context   09/22/2016 11:02 PM 09/25/2016  7:37 PM Full Code [id number]  Norval Morton, MD Inpatient   11/16/2014 11:03 PM 11/17/2014  7:45 PM Full Code [id number]  Berle Mull, MD ED   07/12/2014  6:51 PM 07/14/2014  3:32 PM Full Code [id number]  Kelvin Cellar, MD Inpatient   08/21/2012  8:44 PM 08/23/2012  2:01 PM Full Code [id number]  Birdie Hopes, RN Inpatient   08/12/2012  3:34 PM 08/14/2012  5:25 PM Full Code [id number]  Theodis Blaze, MD Inpatient    Advance Directive Documentation     Most Recent Value  Type of Advance Directive  Healthcare Power of Attorney, Living will  Pre-existing out of facility DNR order (yellow form or pink MOST form)  -  "MOST" Form in Place?  -      Level of Care/Admitting Diagnosis ED Disposition    ED Disposition Condition Comment   Admit  Hospital Area: [facility] [[id number]]  Level of Care: Med-Surg [16]  Diagnosis: LLL pneumonia [facility]) [[id number]]  Admitting Physician: Patrecia Pour [phone]  Attending Physician: Patrecia Pour [phone]  PT Class (Do Not Modify): Observation [104]  PT Acc Code (Do Not Modify): Observation [10022]       Medical History Past Medical History:  Diagnosis Date  . Anxiety   . Asthma   . Chronic diastolic CHF (congestive heart failure) (HCC)    diastolic   . COPD (chronic obstructive pulmonary disease) ([place])   . Coronary artery disease    s/p PCI of RCA 03/2009 at which time there was 70% ramus and 90% RCA, cath 01/2011 showed patent stents in RCA with moderate prox disesae, aneurysmal left circ and small 90% ramus s/p PCI, patent LAD EF 55%  . Diabetes mellitus   . Diverticulitis Oct. 2013   bleeding in the past and Effient for his CAD was stopped  . DVT (deep venous thrombosis) ([place])   .  GERD (gastroesophageal reflux disease)   . Hypercholesterolemia   . Hypertension   . Hypothyroidism   . Obesity (BMI 30-39.9)   . OSA (obstructive sleep apnea)    severe on CPAP  . Peripheral vascular disease ([place]) 11/2006   s/p left stent  . Shortness of breath   . Sleep apnea    severe OSA awaiting CPAP titration    Allergies Allergies  Allergen Reactions  . Clopidogrel Itching    IV Location/Drains/Wounds Patient Lines/Drains/Airways Status   Active Line/Drains/Airways    Name:   Placement date:   Placement time:   Site:   Days:   Peripheral IV 05/22/17 Left Antecubital  05/22/17    1354    Antecubital    less than 1          Labs/Imaging Results for orders placed or performed during the hospital encounter of 05/22/17 (from the past 48 hour(s))  Comprehensive metabolic panel     Status: Abnormal   Collection Time: 05/22/17  1:55 PM  Result Value Ref Range   Sodium 138 135 - 145 mmol/L   Potassium 4.0 3.5 - 5.1 mmol/L   Chloride 99 (L) 101 - 111 mmol/L   CO2 31 22 - 32 mmol/L   Glucose, Bld 147 (H) 65 - 99 mg/dL  BUN 21 (H) 6 - 20 mg/dL   Creatinine, Ser 1.41 (H) 0.61 - 1.24 mg/dL   Calcium 9.4 8.9 - 10.3 mg/dL   Total Protein 7.9 6.5 - 8.1 g/dL   Albumin 4.6 3.5 - 5.0 g/dL   AST 31 15 - 41 U/L   ALT 21 17 - 63 U/L   Alkaline Phosphatase 43 38 - 126 U/L   Total Bilirubin 1.1 0.3 - 1.2 mg/dL   GFR calc non Af Amer 44 (L) >60 mL/min   GFR calc Af Amer 51 (L) >60 mL/min    Comment: (NOTE) The eGFR has been calculated using the CKD EPI equation. This calculation has not been validated in all clinical situations. eGFR's persistently <60 mL/min signify possible Chronic Kidney Disease.    Anion gap 8 5 - 15  CBC WITH DIFFERENTIAL     Status: Abnormal   Collection Time: 05/22/17  1:55 PM  Result Value Ref Range   WBC 11.7 (H) 4.0 - 10.5 K/uL   RBC 5.02 4.22 - 5.81 MIL/uL   Hemoglobin 14.4 13.0 - 17.0 g/dL   HCT 44.5 39.0 - 52.0 %   MCV 88.6 78.0 - 100.0 fL    MCH 28.7 26.0 - 34.0 pg   MCHC 32.4 30.0 - 36.0 g/dL   RDW 14.1 11.5 - 15.5 %   Platelets 130 (L) 150 - 400 K/uL   Neutrophils Relative % 86 %   Neutro Abs 10.1 (H) 1.7 - 7.7 K/uL   Lymphocytes Relative 10 %   Lymphs Abs 1.1 0.7 - 4.0 K/uL   Monocytes Relative 4 %   Monocytes Absolute 0.5 0.1 - 1.0 K/uL   Eosinophils Relative 0 %   Eosinophils Absolute 0.1 0.0 - 0.7 K/uL   Basophils Relative 0 %   Basophils Absolute 0.0 0.0 - 0.1 K/uL  Brain natriuretic peptide     Status: None   Collection Time: 05/22/17  1:55 PM  Result Value Ref Range   B Natriuretic Peptide 23.3 0.0 - 100.0 pg/mL  D-dimer, quantitative (not at [facility])     Status: Abnormal   Collection Time: 05/22/17  1:55 PM  Result Value Ref Range   D-Dimer, Quant 2.97 (H) 0.00 - 0.50 ug/mL-FEU    Comment: (NOTE) At the manufacturer cut-off of 0.50 ug/mL FEU, this assay has been documented to exclude PE with a sensitivity and negative predictive value of 97 to 99%.  At this time, this assay has not been approved by the FDA to exclude DVT/VTE. Results should be correlated with clinical presentation.   I-Stat CG4 Lactic Acid, ED  (not at  [facility] [place])     Status: None   Collection Time: 05/22/17  2:00 PM  Result Value Ref Range   Lactic Acid, Venous 1.84 0.5 - 1.9 mmol/L  I-Stat Troponin, ED (not at [facility])     Status: None   Collection Time: 05/22/17  2:00 PM  Result Value Ref Range   Troponin i, poc 0.00 0.00 - 0.08 ng/mL   Comment 3            Comment: Due to the release kinetics of cTnI, a negative result within the first hours of the onset of symptoms does not rule out myocardial infarction with certainty. If myocardial infarction is still suspected, repeat the test at appropriate intervals.   Urinalysis, Routine w reflex microscopic     Status: Abnormal   Collection Time: 05/22/17  2:03 PM  Result Value Ref Range   Color, Urine  YELLOW YELLOW   APPearance CLEAR CLEAR   Specific Gravity, Urine 1.014 1.005 - 1.030   pH  6.0 5.0 - 8.0   Glucose, UA NEGATIVE NEGATIVE mg/dL   Hgb urine dipstick SMALL (A) NEGATIVE   Bilirubin Urine NEGATIVE NEGATIVE   Ketones, ur NEGATIVE NEGATIVE mg/dL   Protein, ur NEGATIVE NEGATIVE mg/dL   Nitrite NEGATIVE NEGATIVE   Leukocytes, UA NEGATIVE NEGATIVE   RBC / HPF 0-5 0 - 5 RBC/hpf   WBC, UA 0-5 0 - 5 WBC/hpf   Bacteria, UA NONE SEEN NONE SEEN   Squamous Epithelial / LPF NONE SEEN NONE SEEN   Mucous PRESENT    Dg Chest 2 View  Result Date: 05/22/2017 CLINICAL DATA:  Sitting shortness of breath and wheezing for 3 days, history COPD, asthma, CHF, coronary artery disease post stenting, diabetes mellitus, hypertension EXAM: CHEST  2 VIEW COMPARISON:  10/17/2016 FINDINGS: Borderline enlargement of cardiac silhouette. Mediastinal contours and pulmonary vascularity normal. LEFT basilar infiltrate question pneumonia. Linear scarring RIGHT base. Probable calcified granuloma LEFT mid lung. Remaining lungs clear. No pleural effusion or pneumothorax. Bones unremarkable. IMPRESSION: LEFT basilar infiltrate question pneumonia. Old granulomatous disease and minimal RIGHT basilar scarring. Electronically Signed   By: Lavonia Dana M.D.   On: 05/22/2017 15:07    Pending Labs Unresulted Labs    Start     Ordered   05/22/17 1347  Urine culture  STAT,   STAT     05/22/17 1346   05/22/17 1346  Blood Culture (routine x 2)  BLOOD CULTURE X 2,   STAT     05/22/17 1346   Signed and Held  CBC  (heparin)  Once,   R    Comments:  Baseline for heparin therapy IF NOT ALREADY DRAWN.  Notify MD if PLT < 100 K.    Signed and Held   Signed and Held  Creatinine, serum  (heparin)  Once,   R    Comments:  Baseline for heparin therapy IF NOT ALREADY DRAWN.    Signed and Held   Signed and Held  Culture, sputum-assessment  Once,   R     Signed and Held   Signed and Occupational hygienist morning,   R     Signed and Held   Signed and Held  CBC  Tomorrow morning,   R     Signed and Held    Signed and Held  Hemoglobin A1c  Tomorrow morning,   R    Comments:  To assess prior glycemic control    Signed and Held      Isolation Precautions No active isolations  Vitals/Pain Today's Vitals   05/22/17 1700 05/22/17 1730 05/22/17 1800 05/22/17 1930  BP: 127/90 140/75 133/82 121/73  Pulse: (!) 102 97 98 99  Resp: (!) 26 (!) 24 19 (!) 24  Temp:      TempSrc:      SpO2: 97% 94% 93% 91%  Weight:      Height:        Medications Medications  ipratropium-albuterol (DUONEB) 0.5-2.5 (3) MG/3ML nebulizer solution 3 mL (3 mLs Nebulization Given 05/22/17 1359)  sodium chloride 0.9 % bolus 1,000 mL (0 mLs Intravenous Stopped 05/22/17 1729)  sodium chloride 0.9 % bolus 1,000 mL (0 mLs Intravenous Stopped 05/22/17 1737)  cefTRIAXone (ROCEPHIN) 1 g in dextrose 5 % 50 mL IVPB (0 g Intravenous Stopped 05/22/17 1535)  azithromycin (ZITHROMAX) 500 mg in dextrose 5 % 250 mL IVPB (  0 mg Intravenous Stopped 05/22/17 1644)  methylPREDNISolone sodium succinate (SOLU-MEDROL) 125 mg/2 mL injection 125 mg (125 mg Intravenous Given 05/22/17 1501)  acetaminophen (TYLENOL) tablet 1,000 mg (1,000 mg Oral Given 05/22/17 1552)    Mobility walks with person assist

## 2017-05-22 NOTE — ED Notes (Signed)
Bed: VQ00 Expected date:  Expected time:  Means of arrival:  Comments: EMS 83 yo SOB

## 2017-05-22 NOTE — ED Provider Notes (Signed)
[facility] DEPT Provider Note   CSN: [id number] Arrival date & time: 05/22/17  1319     History   Chief Complaint Chief Complaint  Patient presents with  . Shortness of Breath  . Fever    HPI [name] is a 83 y.o. male.  The history is provided by the patient.  Shortness of Breath  This is a new problem. The average episode lasts 1 day. The problem occurs rarely.The current episode started 6 to 12 hours ago. The problem has not changed since onset.Pertinent negatives include no fever, no cough, no chest pain and no abdominal pain. It is unknown what precipitated the problem. He has tried nothing for the symptoms. The treatment provided no relief. Associated medical issues include COPD.     83 year old male who presents with Onset of shortness of breath this morning. Denies any known fevers, chills, nausea or vomiting, abdominal pain, cough, chest pain, lower extremity edema, orthopnea or PND.  Past Medical History:  Diagnosis Date  . Anxiety   . Asthma   . Chronic diastolic CHF (congestive heart failure) (HCC)    diastolic   . COPD (chronic obstructive pulmonary disease) ([place])   . Coronary artery disease    s/p PCI of RCA 03/2009 at which time there was 70% ramus and 90% RCA, cath 01/2011 showed patent stents in RCA with moderate prox disesae, aneurysmal left circ and small 90% ramus s/p PCI, patent LAD EF 55%  . Diabetes mellitus   . Diverticulitis Oct. 2013   bleeding in the past and Effient for his CAD was stopped  . DVT (deep venous thrombosis) ([place])   . GERD (gastroesophageal reflux disease)   . Hypercholesterolemia   . Hypertension   . Hypothyroidism   . Obesity (BMI 30-39.9)   . OSA (obstructive sleep apnea)    severe on CPAP  . Peripheral vascular disease ([place]) 11/2006   s/p left stent  . Shortness of breath   . Sleep apnea    severe OSA awaiting CPAP titration    Patient Active Problem List   Diagnosis Date Noted  . COPD exacerbation ([place])  09/22/2016  . SIRS (systemic inflammatory response syndrome) ([place]) 09/22/2016  . Dyspnea 11/16/2014  . Hypoxia 11/16/2014  . Aftercare following surgery of the circulatory system 08/03/2014  . Chronic diastolic CHF (congestive heart failure) ([place]) 06/21/2014  . Edema extremities 06/21/2014  . COPD GOLD III 12/27/2013  . Coronary artery disease   . OSA (obstructive sleep apnea)   . Obesity (BMI 30-39.9)   . Weakness of left leg 11/20/2012  . Left thigh pain 11/20/2012  . Atherosclerosis of native arteries of the extremities with intermittent claudication 04/01/2012  . WEIGHT GAIN, ABNORMAL 12/07/2009  . Dyslipidemia 11/21/2009  . HYPERTENSION, BENIGN 11/21/2009  . DEEP VENOUS THROMBOPHLEBITIS, LEFT, LEG, HX OF 11/21/2009    Past Surgical History:  Procedure Laterality Date  . ABDOMINAL AORTAGRAM N/A 04/20/2012   Procedure: ABDOMINAL Maxcine Ham;  Surgeon: Angelia Mould, MD;  Location: [facility] CATH LAB;  Service: Cardiovascular;  Laterality: N/A;  . ABDOMINAL AORTAGRAM N/A 12/29/2012   Procedure: ABDOMINAL Maxcine Ham;  Surgeon: Serafina Mitchell, MD;  Location[facility] CATH LAB;  Service: Cardiovascular;  Laterality: N/A;  . BALLOON DILATION  12/26/2011   Procedure: BALLOON DILATION;  Surgeon: Garlan Fair, MD;  Location: [facility] ENDOSCOPY;  Service: Endoscopy;  Laterality: N/A;  . COLONOSCOPY  08/14/2012   Procedure: COLONOSCOPY;  Surgeon: Arta Silence, MD;  Location: [facility] ENDOSCOPY;  Service:  Endoscopy;  Laterality: N/A;  . CORONARY STENT PLACEMENT  2010 and 2012  . ESOPHAGOGASTRODUODENOSCOPY  12/26/2011   Procedure: ESOPHAGOGASTRODUODENOSCOPY (EGD);  Surgeon: Garlan Fair, MD;  Location: [facility] ENDOSCOPY;  Service: Endoscopy;  Laterality: N/A;  . ESOPHAGOGASTRODUODENOSCOPY  08/13/2012   Procedure: ESOPHAGOGASTRODUODENOSCOPY (EGD);  Surgeon: Arta Silence, MD;  Location: [facility] ENDOSCOPY;  Service: Endoscopy;  Laterality: Left;  . HERNIA REPAIR  1992   evntral hernia repair  . LOWER EXTREMITY  ANGIOGRAM Bilateral 12/29/2012   Procedure: LOWER EXTREMITY ANGIOGRAM;  Surgeon: Serafina Mitchell, MD;  Location[facility] C[facility] LAB;  Service: Cardiovascular;  Laterality: Bilateral;  bilat lower extrem angio       Home Medications    Prior to Admission medications   Medication Sig Start Date End Date Taking? Authorizing Provider  albuterol (PROVENTIL HFA;VENTOLIN HFA) 108 (90 BASE) MCG/ACT inhaler Inhale 2 puffs into the lungs every 6 (six) hours as needed for wheezing or shortness of breath.     [provider]  albuterol (PROVENTIL) (2.5 MG/3ML) 0.083% nebulizer solution Take 3 mLs (2.5 mg total) by nebulization every 4 (four) hours as needed for wheezing or shortness of breath. 09/25/16   Patrecia Pour, MD  alfuzosin (UROXATRAL) 10 MG 24 hr tablet Take 10 mg by mouth at bedtime.    [provider]  Apple Cid Vn-Grn Tea-Bit Or-Cr (APPLE CIDER VINEGAR PLUS PO) Take 1 capsule by mouth at bedtime.     [provider]  aspirin EC 81 MG tablet Take 81 mg by mouth at bedtime.     [provider]  budesonide-formoterol (SYMBICORT) 160-4.5 MCG/ACT inhaler Inhale 2 puffs into the lungs 2 (two) times daily.    [provider]  Fluticasone-Umeclidin-Vilant 100-62.5-25 MCG/INH AEPB Inhale 1 Dose into the lungs daily.    [provider]  furosemide (LASIX) 40 MG tablet Take 2 tablets (80 mg total) by mouth every evening. 01/14/17 04/14/17  Isaiah Serge, NP  glimepiride (AMARYL) 2 MG tablet Take 2 mg by mouth at bedtime.     [provider]  isosorbide mononitrate (IMDUR) 60 MG 24 hr tablet Take 60 mg by mouth at bedtime.    [provider]  isosorbide mononitrate (IMDUR) 60 MG 24 hr tablet TAKE 1 TABLET (60 MG TOTAL) BY MOUTH DAILY. 04/02/17   Sueanne Margarita, MD  levothyroxine (SYNTHROID, LEVOTHROID) 125 MCG tablet Take 250 mcg by mouth daily before breakfast.     [provider]  loperamide (IMODIUM) 2 MG capsule Take 2 mg by mouth  as needed for diarrhea or loose stools (Take as directed).     [provider]  meloxicam (MOBIC) 15 MG tablet Take 15 mg by mouth at bedtime.     [provider]  metoprolol succinate (TOPROL-XL) 50 MG 24 hr tablet Take 50 mg by mouth at bedtime. Take with or immediately following a meal.    [provider]  metoprolol succinate (TOPROL-XL) 50 MG 24 hr tablet TAKE 1 TABLET BY MOUTH EVERY DAY 02/11/17   Sueanne Margarita, MD  nitroGLYCERIN (NITROSTAT) 0.4 MG SL tablet Place 0.4 mg under the tongue every 5 (five) minutes x 3 doses as needed. For chest pain.    [provider]  pantoprazole (PROTONIX) 40 MG tablet Take 40 mg by mouth 2 (two) times daily.     [provider]  rosuvastatin (CRESTOR) 40 MG tablet Take 1 tablet (40 mg total) by mouth at bedtime. 12/06/16 12/01/17  Fransico Him  R, MD    Family History Family History  Problem Relation Age of Onset  . Diabetes Mother   . Hyperlipidemia Mother   . Hypertension Mother   . Heart attack Mother   . Heart disease Mother        before age 51  . Diabetes Brother   . Heart disease Father        before age 23  . Breast cancer Sister   . Diabetes Sister   . Hyperlipidemia Sister   . Hypertension Sister   . Heart attack Sister   . Hyperlipidemia Sister   . Hypertension Sister   . Heart disease Sister        Heart Disease before age 76  . Asthma Sister   . Malignant hyperthermia Neg Hx     Social History Social History  Substance Use Topics  . Smoking status: Former Smoker    Packs/day: 0.50    Years: 15.00    Types: Cigarettes, Cigars    Quit date: 10/15/1963  . Smokeless tobacco: Never Used  . Alcohol use No     Allergies   Clopidogrel   Review of Systems Review of Systems  Constitutional: Negative for fever.  Respiratory: Positive for shortness of breath. Negative for cough.   Cardiovascular: Negative for chest pain.  Gastrointestinal: Negative for abdominal pain.    Genitourinary: Negative for dysuria.  All other systems reviewed and are negative.    Physical Exam Updated Vital Signs BP 129/69   Pulse (!) 110   Temp (!) 103.7 F (39.8 C) (Rectal)   Resp (!) 24   Ht 5\' 6"  (1.676 m)   Wt 93.4 kg (206 lb)   SpO2 95%   BMI 33.25 kg/m   Physical Exam Physical Exam  Nursing note and vitals reviewed. Constitutional: Well developed, well nourished, non-toxic, and in no acute distress Head: Normocephalic and atraumatic.  Mouth/Throat: Oropharynx is clear and moist.  Neck: Normal range of motion. Neck supple.  Cardiovascular: Normal rate and regular rhythm.   Pulmonary/Chest: Tachypnea. Scattered wheeze.   Abdominal: Soft. Mild distension. There is no tenderness. There is no rebound and no guarding.  Musculoskeletal: Normal range of motion. Trace edema. Neurological: Alert, no facial droop, fluent speech, moves all extremities symmetrically Skin: Skin is warm and dry.  Psychiatric: Cooperative   ED Treatments / Results  Labs (all labs ordered are listed, but only abnormal results are displayed) Labs Reviewed  COMPREHENSIVE METABOLIC PANEL - Abnormal; Notable for the following:       Result Value   Chloride 99 (*)    Glucose, Bld 147 (*)    BUN 21 (*)    Creatinine, Ser 1.41 (*)    GFR calc non Af Amer 44 (*)    GFR calc Af Amer 51 (*)    All other components within normal limits  CBC WITH DIFFERENTIAL/PLATELET - Abnormal; Notable for the following:    WBC 11.7 (*)    Platelets 130 (*)    Neutro Abs 10.1 (*)    All other components within normal limits  URINALYSIS, ROUTINE W REFLEX MICROSCOPIC - Abnormal; Notable for the following:    Hgb urine dipstick SMALL (*)    All other components within normal limits  CULTURE, BLOOD (ROUTINE X 2)  CULTURE, BLOOD (ROUTINE X 2)  URINE CULTURE  BRAIN NATRIURETIC PEPTIDE  I-STAT CG4 LACTIC ACID, ED  I-STAT TROPONIN, ED    EKG  EKG Interpretation  Date/Time:  Thursday May 22 2017  13:43:14 EDT Ventricular Rate:  123 PR Interval:    QRS Duration: 84 QT Interval:  298 QTC Calculation: 425 R Axis:   -53 Text Interpretation:  Sinus tachycardia Left anterior fascicular block Abnormal R-wave progression, late transition other than tachycardia, no other acute changes  Confirmed by Brantley Stage [phone]) on 05/22/2017 2:31:15 PM       Radiology Dg Chest 2 View  Result Date: 05/22/2017 CLINICAL DATA:  Sitting shortness of breath and wheezing for 3 days, history COPD, asthma, CHF, coronary artery disease post stenting, diabetes mellitus, hypertension EXAM: CHEST  2 VIEW COMPARISON:  10/17/2016 FINDINGS: Borderline enlargement of cardiac silhouette. Mediastinal contours and pulmonary vascularity normal. LEFT basilar infiltrate question pneumonia. Linear scarring RIGHT base. Probable calcified granuloma LEFT mid lung. Remaining lungs clear. No pleural effusion or pneumothorax. Bones unremarkable. IMPRESSION: LEFT basilar infiltrate question pneumonia. Old granulomatous disease and minimal RIGHT basilar scarring. Electronically Signed   By: Lavonia Tehya Leath M.D.   On: 05/22/2017 15:07    Procedures Procedures (including critical care time)  Medications Ordered in ED Medications  azithromycin (ZITHROMAX) 500 mg in dextrose 5 % 250 mL IVPB (500 mg Intravenous New Bag/Given 05/22/17 1544)  ipratropium-albuterol (DUONEB) 0.5-2.5 (3) MG/3ML nebulizer solution 3 mL (3 mLs Nebulization Given 05/22/17 1359)  sodium chloride 0.9 % bolus 1,000 mL (1,000 mLs Intravenous New Bag/Given 05/22/17 1403)  sodium chloride 0.9 % bolus 1,000 mL (1,000 mLs Intravenous New Bag/Given 05/22/17 1407)  cefTRIAXone (ROCEPHIN) 1 g in dextrose 5 % 50 mL IVPB (0 g Intravenous Stopped 05/22/17 1535)  methylPREDNISolone sodium succinate (SOLU-MEDROL) 125 mg/2 mL injection 125 mg (125 mg Intravenous Given 05/22/17 1501)  acetaminophen (TYLENOL) tablet 1,000 mg (1,000 mg Oral Given 05/22/17 1552)     Initial Impression / Assessment  and Plan / ED Course  I have reviewed the triage vital signs and the nursing notes.  Pertinent labs & imaging results that were available during my care of the patient were reviewed by me and considered in my medical decision making (see chart for details).     Patient is febrile, tachycardic on arrival. With EMS with hypoxic to room air to 88%. Arrives on 3 L oxygen. Code sepsis is activated. Received IV fluids, and ceftriaxone and azithromycin for presumed pneumonia. Chest x-ray visualized and does show potential infiltrate in the left lower lobe. UA unremarkable. He has a normal lactic acid, mild leukocytosis, and a mild history of present illness. Discussed with Dr. Hermina Barters from hospitalist service will admit for ongoing treatment.    Final Clinical Impressions(s) / ED Diagnoses   Final diagnoses:  Lobar pneumonia ([place])  Sepsis, due to unspecified organism [facility])    New Prescriptions New Prescriptions   No medications on file     Forde Dandy, MD 05/22/17 1557

## 2017-05-22 NOTE — ED Triage Notes (Signed)
Per EMS: Pt is coming from home Pt has been DOB for a few days and it got worse this morning around 5am. Pt has a hx of COPD and asthma.  Pt has active wheezing per EMS Pt was at 89% on RA, pt is now on 3L and is at 94%. Pt does not normally wear oxygen 12 lead was unremarkable. Denies N/V/D Last vitals: 139/84 HR 116 94% with #l RR 20

## 2017-05-22 NOTE — ED Notes (Addendum)
Unsuccessful IV attempt to left hand but able to draw one set of blood cultures. 1346 time drawn.

## 2017-05-22 NOTE — ED Notes (Signed)
Will collect urine when pt. Voids. Nurse aware.  

## 2017-05-22 NOTE — H&P (Signed)
History and Physical   [name] BPZ:[id number] DOB: 1929/11/08 DOA: 05/22/2017  Referring MD/NP/PA: Forde Dandy, MD, EDP PCP: [name], MD  Patient coming from: Home  Chief Complaint: Shortness of breath and chills  HPI: [name] is a 83 y.o. male with a history of COPD, CAD s/p stenting, chronic diastolic CHF who presented 8/9 to the ED for shortness of breath and chills.   History provided by patient and daughters with whom he lives. He has been reporting shortness of breath worsening over the past few days but reports severe, constant dyspnea worse with exertion when trying to get out of bed this morning. He felt chills but did not check temperature. His daughter insisted on calling EMS. On their arrival he was tachycardic, hypoxic to 88% on room air with increased work of breathing, improved to 94% on 3L in the ED. WBC 11.7, normal lactic acid, creatinine slightly up from baseline at 1.4, UA negative, and an infiltrate at the left lung base was noted on CXR. IV fluids, ceftriaxone, and azithromycin were provided. He was wheezing, so solumedrol was given. Hospitalists called to admit for acute hypoxic respiratory failure due to LLL pneumonia.   His daughter reports he's been having gradual decline in cognitive function, memory impairment, over the past several months. This has not been worked up yet. During the past several weeks they report his breathing was getting worse gradually as well, though he is not very consistently taking any medications.  Reports polyuria, worse with diuretics which causes him not to take them at baseline.   Review of Systems: , and per HPI. All others reviewed and are negative.   Past Medical History:  Diagnosis Date  . Anxiety   . Asthma   . Chronic diastolic CHF (congestive heart failure) (HCC)    diastolic   . COPD (chronic obstructive pulmonary disease) ([place])   . Coronary artery disease    s/p PCI of RCA 03/2009 at which time there was  70% ramus and 90% RCA, cath 01/2011 showed patent stents in RCA with moderate prox disesae, aneurysmal left circ and small 90% ramus s/p PCI, patent LAD EF 55%  . Diabetes mellitus   . Diverticulitis Oct. 2013   bleeding in the past and Effient for his CAD was stopped  . DVT (deep venous thrombosis) ([place])   . GERD (gastroesophageal reflux disease)   . Hypercholesterolemia   . Hypertension   . Hypothyroidism   . Obesity (BMI 30-39.9)   . OSA (obstructive sleep apnea)    severe on CPAP  . Peripheral vascular disease ([place]) 11/2006   s/p left stent  . Shortness of breath   . Sleep apnea    severe OSA awaiting CPAP titration   Past Surgical History:  Procedure Laterality Date  . ABDOMINAL AORTAGRAM N/A 04/20/2012   Procedure: ABDOMINAL Maxcine Ham;  Surgeon: Angelia Mould, MD;  Location: [facility] CATH LAB;  Service: Cardiovascular;  Laterality: N/A;  . ABDOMINAL AORTAGRAM N/A 12/29/2012   Procedure: ABDOMINAL Maxcine Ham;  Surgeon: Serafina Mitchell, MD;  Location: [facility] CATH LAB;  Service: Cardiovascular;  Laterality: N/A;  . BALLOON DILATION  12/26/2011   Procedure: BALLOON DILATION;  Surgeon: Garlan Fair, MD;  Location: [facility] ENDOSCOPY;  Service: Endoscopy;  Laterality: N/A;  . COLONOSCOPY  08/14/2012   Procedure: COLONOSCOPY;  Surgeon: Arta Silence, MD;  Location: [facility] ENDOSCOPY;  Service: Endoscopy;  Laterality: N/A;  . CORONARY STENT PLACEMENT  2010 and 2012  . ESOPHAGOGASTRODUODENOSCOPY  12/26/2011   Procedure: ESOPHAGOGASTRODUODENOSCOPY (EGD);  Surgeon: Garlan Fair, MD;  Location: [facility] ENDOSCOPY;  Service: Endoscopy;  Laterality: N/A;  . ESOPHAGOGASTRODUODENOSCOPY  08/13/2012   Procedure: ESOPHAGOGASTRODUODENOSCOPY (EGD);  Surgeon: Arta Silence, MD;  Location: [facility] ENDOSCOPY;  Service: Endoscopy;  Laterality: Left;  . HERNIA REPAIR  1992   evntral hernia repair  . LOWER EXTREMITY ANGIOGRAM Bilateral 12/29/2012   Procedure: LOWER EXTREMITY ANGIOGRAM;  Surgeon: Serafina Mitchell, MD;   Location: [facility] CATH LAB;  Service: Cardiovascular;  Laterality: Bilateral;  bilat lower extrem angio   - Very remote limited smoking history.   reports that he quit smoking about 53 years ago. His smoking use included Cigarettes and Cigars. He has a 7.50 pack-year smoking history. He has never used smokeless tobacco. He reports that he does not drink alcohol or use drugs. Allergies  Allergen Reactions  . Clopidogrel Itching   Family History  Problem Relation Age of Onset  . Diabetes Mother   . Hyperlipidemia Mother   . Hypertension Mother   . Heart attack Mother   . Heart disease Mother        before age 51  . Diabetes Brother   . Heart disease Father        before age 31  . Breast cancer Sister   . Diabetes Sister   . Hyperlipidemia Sister   . Hypertension Sister   . Heart attack Sister   . Hyperlipidemia Sister   . Hypertension Sister   . Heart disease Sister        Heart Disease before age 24  . Asthma Sister   . Malignant hyperthermia Neg Hx    - Family history otherwise reviewed and not pertinent.  Prior to Admission medications   Medication Sig Start Date End Date Taking? Authorizing Provider  albuterol (PROVENTIL HFA;VENTOLIN HFA) 108 (90 BASE) MCG/ACT inhaler Inhale 2 puffs into the lungs every 6 (six) hours as needed for wheezing or shortness of breath.    Yes [provider]  albuterol (PROVENTIL) (2.5 MG/3ML) 0.083% nebulizer solution Take 3 mLs (2.5 mg total) by nebulization every 4 (four) hours as needed for wheezing or shortness of breath. 09/25/16  Yes Patrecia Pour, MD  alfuzosin (UROXATRAL) 10 MG 24 hr tablet Take 10 mg by mouth daily. At 5 pm   Yes [provider]  aspirin EC 81 MG tablet Take 81 mg by mouth daily. At 5 pm   Yes [provider]  budesonide-formoterol (SYMBICORT) 160-4.5 MCG/ACT inhaler Inhale 2 puffs into the lungs 2 (two) times daily.   Yes [provider]  glimepiride (AMARYL) 2 MG tablet Take 2 mg by mouth  daily. At 5 pm   Yes [provider]  isosorbide mononitrate (IMDUR) 60 MG 24 hr tablet Take 60 mg by mouth daily. At 5 pm   Yes [provider]  isosorbide mononitrate (IMDUR) 60 MG 24 hr tablet TAKE 1 TABLET (60 MG TOTAL) BY MOUTH DAILY. 04/02/17  Yes Sueanne Margarita, MD  levothyroxine (SYNTHROID, LEVOTHROID) 150 MCG tablet Take 150 mcg by mouth daily before breakfast. 05/19/17  Yes [provider]  loperamide (IMODIUM) 2 MG capsule Take 2 mg by mouth as needed for diarrhea or loose stools (Take as directed).    Yes [provider]  meloxicam (MOBIC) 15 MG tablet Take 15 mg by mouth at bedtime.    Yes [provider]  metoprolol succinate (TOPROL-XL) 50 MG 24 hr tablet TAKE 1 TABLET BY MOUTH  EVERY DAY 02/11/17  Yes Turner, Traci R, MD  pantoprazole (PROTONIX) 40 MG tablet Take 40 mg by mouth daily. At 5 pm   Yes [provider]  rosuvastatin (CRESTOR) 40 MG tablet Take 1 tablet (40 mg total) by mouth at bedtime. Patient taking differently: Take 40 mg by mouth daily. At 5 pm 12/06/16 12/01/17 Yes Turner, Eber Hong, MD  furosemide (LASIX) 40 MG tablet Take 2 tablets (80 mg total) by mouth every evening. Patient not taking: Reported on 05/22/2017 01/14/17 04/14/17  Isaiah Serge, NP    Physical Exam: Vitals:   05/22/17 1630 05/22/17 1700 05/22/17 1730 05/22/17 1800  BP: (!) 124/52 127/90 140/75 133/82  Pulse: 99 (!) 102 97 98  Resp: 20 (!) 26 (!) 24 19  Temp:      TempSrc:      SpO2: 98% 97% 94% 93%  Weight:      Height:       Constitutional: Elderly male in no distress, calm demeanor Eyes: Lids and conjunctivae normal, PERRL ENMT: Mucous membranes are moist. Posterior pharynx clear of any exudate or lesions. Fair dentition.  Neck: normal, supple, no masses, no thyromegaly Respiratory: Nonlabored tachypnea on 3L by nasal cannula. Diminished breath sounds globally with minimal end-expiratory wheezing and no crackles.  Cardiovascular: Regular rate  and rhythm, no murmurs, rubs, or gallops. No carotid bruits. No JVD. 1+ RLE and 2+ LLE edema. Palpable pedal pulses. Abdomen: Normoactive bowel sounds. No tenderness, non-distended, and no masses palpated. No hepatosplenomegaly. GU: No indwelling catheter Musculoskeletal: No clubbing / cyanosis. No joint deformity upper and lower extremities. Good ROM, no contractures. Normal muscle tone.  Skin: Warm, dry. No rashes, wounds, no ulcers.  Neurologic: CN II-XII grossly intact. Gait not assessed. Speech normal. No focal deficits in motor strength or sensation in all extremities.  Psychiatric: Alert and oriented x3. Linear thought process, goal-directed speech. Mood euthymic with congruent affect.   Labs on Admission: I have personally reviewed following labs and imaging studies  CBC:  Recent Labs Lab 05/22/17 1355  WBC 11.7*  NEUTROABS 10.1*  HGB 14.4  HCT 44.5  MCV 88.6  PLT 240*   Basic Metabolic Panel:  Recent Labs Lab 05/22/17 1355  NA 138  K 4.0  CL 99*  CO2 31  GLUCOSE 147*  BUN 21*  CREATININE 1.41*  CALCIUM 9.4   GFR: Estimated Creatinine Clearance: 41 mL/min (A) (by C-G formula based on SCr of 1.41 mg/dL (H)). Liver Function Tests:  Recent Labs Lab 05/22/17 1355  AST 31  ALT 21  ALKPHOS 43  BILITOT 1.1  PROT 7.9  ALBUMIN 4.6   No results for input(s): LIPASE, AMYLASE in the last 168 hours. No results for input(s): AMMONIA in the last 168 hours. Coagulation Profile: No results for input(s): INR, PROTIME in the last 168 hours. Cardiac Enzymes: No results for input(s): CKTOTAL, CKMB, CKMBINDEX, TROPONINI in the last 168 hours. BNP (last 3 results) No results for input(s): PROBNP in the last 8760 hours. HbA1C: No results for input(s): HGBA1C in the last 72 hours. CBG: No results for input(s): GLUCAP in the last 168 hours. Lipid Profile: No results for input(s): CHOL, HDL, LDLCALC, TRIG, CHOLHDL, LDLDIRECT in the last 72 hours. Thyroid Function  Tests: No results for input(s): TSH, T4TOTAL, FREET4, T3FREE, THYROIDAB in the last 72 hours. Anemia Panel: No results for input(s): VITAMINB12, FOLATE, FERRITIN, TIBC, IRON, RETICCTPCT in the last 72 hours. Urine analysis:    Component Value Date/Time   COLORURINE  YELLOW 05/22/2017 [place] 05/22/2017 1403   LABSPEC 1.014 05/22/2017 1403   PHURINE 6.0 05/22/2017 1403   GLUCOSEU NEGATIVE 05/22/2017 1403   HGBUR SMALL (A) 05/22/2017 1403   BILIRUBINUR NEGATIVE 05/22/2017 1403   KETONESUR NEGATIVE 05/22/2017 1403   PROTEINUR NEGATIVE 05/22/2017 1403   UROBILINOGEN 0.2 07/12/2014 1702   NITRITE NEGATIVE 05/22/2017 1403   LEUKOCYTESUR NEGATIVE 05/22/2017 1403    No results found for this or any previous visit (from the past 240 hour(s)).   Radiological Exams on Admission: Dg Chest 2 View  Result Date: 05/22/2017 CLINICAL DATA:  Sitting shortness of breath and wheezing for 3 days, history COPD, asthma, CHF, coronary artery disease post stenting, diabetes mellitus, hypertension EXAM: CHEST  2 VIEW COMPARISON:  10/17/2016 FINDINGS: Borderline enlargement of cardiac silhouette. Mediastinal contours and pulmonary vascularity normal. LEFT basilar infiltrate question pneumonia. Linear scarring RIGHT base. Probable calcified granuloma LEFT mid lung. Remaining lungs clear. No pleural effusion or pneumothorax. Bones unremarkable. IMPRESSION: LEFT basilar infiltrate question pneumonia. Old granulomatous disease and minimal RIGHT basilar scarring. Electronically Signed   By: Lavonia Dana M.D.   On: 05/22/2017 15:07    EKG: Independently reviewed. Sinus tachycardia with late RWP. No ischemic changes noted.  Assessment/Plan Principal Problem:   LLL pneumonia (HCC) Active Problems:   HYPERTENSION, BENIGN   Coronary artery disease   OSA (obstructive sleep apnea)   Obesity (BMI 30-39.9)   COPD GOLD III   Chronic diastolic CHF (congestive heart failure) (HCC)   COPD exacerbation  (HCC)   History of DVT of lower extremity   Acute hypoxic respiratory failure: BNP 23.3, troponin negative.  - Continue oxygen prn  - Treat conditions as below  Sepsis due to community-acquired LLL pneumonia: Febrile, tachycardic, with neutrophilic leukocytosis and LLL infiltrate. Lactic acid reassuringly normal. - Continue ceftriaxone and azithromycin - Monitor blood cultures and sputum culture - Due to LLE swelling, h/o DVT, tachycardia and tachypnea and hypoxia, abrupt dyspnea, will check LE dopplers and d-dimer. If D-dimer positive, would proceed to V/Q scan given kidney impairment.   COPD with acute exacerbation:  - Abx as above - Continue bronchodilators - Given IV steroids in ED, continue prednisone  AKI vs. CKD stage III: Uncertain baseline, though he's had recent lab work with elevated creatinine though to be new.  - Check renal U/S - Fluids as below, monitor volume status and UOP - Avoid NSAIDs  Chronic diastolic CHF: Seen by Dr. Radford Pax. Last office visit weight was 218lbs. Daughter reports nonadherence for diuretics. Weight on admission 206lbs. BNP normal.  - Given IVFs, will continue maintenance IVFs.   CAD: s/p PCI of RCA in 2010. No chest pain. Troponin negative, nonischemic ECG - Continue home medications  Severe OSA:  - CPAP qHS   T2DM:  - Update HbA1c - Hold glimepiride - CBG and SSI qAC/HS  Hypothyroidism: Chronic, stable dose - Continue synthroid  Essential HTN:  - Continue metoprolol and imdur  DVT prophylaxis: Subcutaneous heparin  Code Status: Full  Family Communication: Daughters at bedside Disposition Plan: [place] home once improved Consults called: None  Admission status: Observation    Vance Gather, MD [facility] Pager [phone]  If 7PM-7AM, please contact night-coverage [email or web address] Password [email or web address] 05/22/2017, 6:53 PM

## 2017-05-23 ENCOUNTER — Observation Stay (HOSPITAL_BASED_OUTPATIENT_CLINIC_OR_DEPARTMENT_OTHER): Payer: Medicare HMO

## 2017-05-23 ENCOUNTER — Observation Stay (HOSPITAL_COMMUNITY): Payer: Medicare HMO

## 2017-05-23 DIAGNOSIS — Z86718 Personal history of other venous thrombosis and embolism: Secondary | ICD-10-CM | POA: Diagnosis not present

## 2017-05-23 DIAGNOSIS — J181 Lobar pneumonia, unspecified organism: Secondary | ICD-10-CM | POA: Diagnosis not present

## 2017-05-23 DIAGNOSIS — J441 Chronic obstructive pulmonary disease with (acute) exacerbation: Secondary | ICD-10-CM

## 2017-05-23 DIAGNOSIS — M7989 Other specified soft tissue disorders: Secondary | ICD-10-CM

## 2017-05-23 DIAGNOSIS — I5032 Chronic diastolic (congestive) heart failure: Secondary | ICD-10-CM | POA: Diagnosis not present

## 2017-05-23 DIAGNOSIS — N179 Acute kidney failure, unspecified: Secondary | ICD-10-CM | POA: Diagnosis not present

## 2017-05-23 LAB — BASIC METABOLIC PANEL
ANION GAP: 10 (ref 5–15)
BUN: 19 mg/dL (ref 6–20)
CO2: 26 mmol/L (ref 22–32)
Calcium: 9.1 mg/dL (ref 8.9–10.3)
Chloride: 103 mmol/L (ref 101–111)
Creatinine, Ser: 1.17 mg/dL (ref 0.61–1.24)
GFR calc Af Amer: >60 mL/min (ref >60)
GFR calc non Af Amer: 55 mL/min — ABNORMAL LOW (ref >60)
GLUCOSE: 176 mg/dL — AB (ref 65–99)
POTASSIUM: 4 mmol/L (ref 3.5–5.1)
Sodium: 139 mmol/L (ref 135–145)

## 2017-05-23 LAB — CBC
HEMATOCRIT: 44.6 % (ref 39.0–52.0)
HEMOGLOBIN: 14.3 g/dL (ref 13.0–17.0)
MCH: 28 pg (ref 26.0–34.0)
MCHC: 32.1 g/dL (ref 30.0–36.0)
MCV: 87.5 fL (ref 78.0–100.0)
Platelets: 132 10*3/uL — ABNORMAL LOW (ref 150–400)
RBC: 5.1 MIL/uL (ref 4.22–5.81)
RDW: 14.1 % (ref 11.5–15.5)
WBC: 16.3 10*3/uL — ABNORMAL HIGH (ref 4.0–10.5)

## 2017-05-23 LAB — HEMOGLOBIN A1C
Hgb A1c MFr Bld: 6.9 % — ABNORMAL HIGH (ref 4.8–5.6)
Mean Plasma Glucose: 151.33 mg/dL

## 2017-05-23 LAB — GLUCOSE, CAPILLARY
GLUCOSE-CAPILLARY: 168 mg/dL — AB (ref 65–99)
GLUCOSE-CAPILLARY: 145 mg/dL — AB (ref 65–99)
Glucose-Capillary: 135 mg/dL — ABNORMAL HIGH (ref 65–99)
Glucose-Capillary: 147 mg/dL — ABNORMAL HIGH (ref 65–99)

## 2017-05-23 MED ORDER — METHYLPREDNISOLONE SODIUM SUCC 125 MG IJ SOLR
60.0000 mg | Freq: Two times a day (BID) | INTRAMUSCULAR | Status: DC
  Administered 2017-05-23 (×2): 60 mg via INTRAVENOUS
  Filled 2017-05-23 (×2): qty 2

## 2017-05-23 MED ORDER — FUROSEMIDE 40 MG PO TABS
80.0000 mg | ORAL_TABLET | Freq: Every day | ORAL | Status: DC
  Administered 2017-05-23: 80 mg via ORAL
  Filled 2017-05-23 (×2): qty 2

## 2017-05-23 NOTE — Progress Notes (Signed)
PROGRESS NOTE    [name]  HXT:[id number] DOB: [birth year] DOA: 05/22/2017 PCP: [name], MD  Brief Narrative:[name] is a 83 y.o. male with a history of COPD, CAD s/p stenting, chronic diastolic CHF who presented 8/9 to the ED for shortness of breath and chills.  He reported shortness of breath worsening over the past few days with chills, fever.  On their arrival he was tachycardic, hypoxic to 88% on room air with increased work of breathing, improved to 94% on 3L in the ED. WBC 11.7, normal lactic acid, creatinine slightly up from baseline at 1.4, UA negative, and an infiltrate at the left lung base was noted on CXR   Assessment & Plan:   Acute hypoxic respiratory failure -due to pneumonia and COPD exacerbation -continue Ceftriaxone/azithromycin -FU Blood CX-NGTD -still wheezing will change prednisone to IV solumedrol -duonebs scheduled and PRN  Sepsis due to community-acquired LLL pneumonia:  -improving, Abx as above -clinically do not suspect PE with fever of 103, SIRS, pneumonia despite elevated d dimer, dopplers negative -VQ scan not needed, bilateral LE edema is due to chronic diastolic CHF and non compliance with lasix  COPD with acute exacerbation:  -IV steroids, nebs, Abx as listed above  DM type 2 -holding oral meds, SSI for now -Hba1c 6.9, CBGs stable despite steroids  AKI vs. CKD stage III: -unknown baseline, resolved with hydration, stop IVF -Renal US no hydronephrosis  Chronic diastolic CHF:  -followed by Dr. Radford Pax. Last office visit weight was 218lbs. Daughter reports nonadherence for diuretics. Weight on admission 206lbs. BNP normal.  - stop IVF, resume PO lasix  CAD: s/p PCI of RCA in 2010.  - No chest pain. Troponin negative, nonischemic ECG - Continue home medications  Severe OSA:  - CPAP qHS   Hypothyroidism:  - Continue synthroid  Essential HTN:  - Continue metoprolol and imdur  DVT prophylaxis: Subcutaneous  heparin  Code Status: Full  Family Communication: daughters at bedside Disposition Plan: home in 1-2days  Consultants:     Antimicrobials:   Rocephin/zithromax 8/9   Subjective: Breathing improving, still coughing, no chest pain  Objective: Vitals:   05/22/17 2038 05/22/17 2336 05/23/17 0547 05/23/17 0807  BP:   (!) 158/89   Pulse:   90   Resp:   (!) 21   Temp:   97.9 F (36.6 C)   TempSrc:   Oral   SpO2:  91% 95% 94%  Weight: 93.4 kg (206 lb)     Height: 5\' 5"  (1.651 m)       Intake/Output Summary (Last 24 hours) at 05/23/17 1254 Last data filed at 05/23/17 1248  Gross per 24 hour  Intake             4340 ml  Output              700 ml  Net             3640 ml   Filed Weights   05/22/17 1349 05/22/17 2036 05/22/17 2038  Weight: 93.4 kg (206 lb) 93.4 kg (206 lb) 93.4 kg (206 lb)    Examination:  General exam: Appears calm and comfortable, no distress Respiratory system: poor air movement, scattered ronchi and wheezes Cardiovascular system: S1 & S2 heard, RRR. No JVD,  Gastrointestinal system: Abdomen is nondistended, soft and nontender.Normal bowel sounds heard. Central nervous system: Alert and oriented. No focal neurological deficits. Extremities: 1-2plus edema Skin: No rashes, lesions or ulcers Psychiatry: pleasant, NAD  Data Reviewed:   CBC:  Recent Labs Lab 05/22/17 1355 05/23/17 0554  WBC 11.7* 16.3*  NEUTROABS 10.1*  --   HGB 14.4 14.3  HCT 44.5 44.6  MCV 88.6 87.5  PLT 130* 093*   Basic Metabolic Panel:  Recent Labs Lab 05/22/17 1355 05/23/17 0554  NA 138 139  K 4.0 4.0  CL 99* 103  CO2 31 26  GLUCOSE 147* 176*  BUN 21* 19  CREATININE 1.41* 1.17  CALCIUM 9.4 9.1   GFR: Estimated Creatinine Clearance: 48.5 mL/min (by C-G formula based on SCr of 1.17 mg/dL). Liver Function Tests:  Recent Labs Lab 05/22/17 1355  AST 31  ALT 21  ALKPHOS 43  BILITOT 1.1  PROT 7.9  ALBUMIN 4.6   No results for input(s): LIPASE,  AMYLASE in the last 168 hours. No results for input(s): AMMONIA in the last 168 hours. Coagulation Profile: No results for input(s): INR, PROTIME in the last 168 hours. Cardiac Enzymes: No results for input(s): CKTOTAL, CKMB, CKMBINDEX, TROPONINI in the last 168 hours. BNP (last 3 results) No results for input(s): PROBNP in the last 8760 hours. HbA1C:  Recent Labs  05/23/17 0554  HGBA1C 6.9*   CBG:  Recent Labs Lab 05/22/17 2035 05/23/17 0739 05/23/17 1144  GLUCAP 166* 135* 147*   Lipid Profile: No results for input(s): CHOL, HDL, LDLCALC, TRIG, CHOLHDL, LDLDIRECT in the last 72 hours. Thyroid Function Tests: No results for input(s): TSH, T4TOTAL, FREET4, T3FREE, THYROIDAB in the last 72 hours. Anemia Panel: No results for input(s): VITAMINB12, FOLATE, FERRITIN, TIBC, IRON, RETICCTPCT in the last 72 hours. Urine analysis:    Component Value Date/Time   COLORURINE YELLOW 05/22/2017 [place] 05/22/2017 1403   LABSPEC 1.014 05/22/2017 1403   PHURINE 6.0 05/22/2017 1403   GLUCOSEU NEGATIVE 05/22/2017 1403   HGBUR SMALL (A) 05/22/2017 1403   BILIRUBINUR NEGATIVE 05/22/2017 1403   KETONESUR NEGATIVE 05/22/2017 1403   PROTEINUR NEGATIVE 05/22/2017 1403   UROBILINOGEN 0.2 07/12/2014 1702   NITRITE NEGATIVE 05/22/2017 1403   LEUKOCYTESUR NEGATIVE 05/22/2017 1403   Sepsis Labs: @LABRCNTIP (procalcitonin:4,lacticidven:4)  ) Recent Results (from the past 240 hour(s))  Blood Culture (routine x 2)     Status: None (Preliminary result)   Collection Time: 05/22/17  1:46 PM  Result Value Ref Range Status   Specimen Description BLOOD LEFT HAND  Final   Special Requests IN PEDIATRIC BOTTLE Blood Culture adequate volume  Final   Culture   Final    NO GROWTH < 24 HOURS Performed at [facility], [place] [address] [place], [place] [address]    Report Status PENDING  Incomplete  Blood Culture (routine x 2)     Status: None (Preliminary result)   Collection  Time: 05/22/17  1:55 PM  Result Value Ref Range Status   Specimen Description BLOOD LEFT ANTECUBITAL  Final   Special Requests   Final    BOTTLES DRAWN AEROBIC AND ANAEROBIC Blood Culture adequate volume   Culture   Final    NO GROWTH < 24 HOURS Performed at [facility], [place] [address], [place], [place] [address]    Report Status PENDING  Incomplete         Radiology Studies: Dg Chest 2 View  Result Date: 05/22/2017 CLINICAL DATA:  Sitting shortness of breath and wheezing for 3 days, history COPD, asthma, CHF, coronary artery disease post stenting, diabetes mellitus, hypertension EXAM: CHEST  2 VIEW COMPARISON:  10/17/2016 FINDINGS: Borderline enlargement  of cardiac silhouette. Mediastinal contours and pulmonary vascularity normal. LEFT basilar infiltrate question pneumonia. Linear scarring RIGHT base. Probable calcified granuloma LEFT mid lung. Remaining lungs clear. No pleural effusion or pneumothorax. Bones unremarkable. IMPRESSION: LEFT basilar infiltrate question pneumonia. Old granulomatous disease and minimal RIGHT basilar scarring. Electronically Signed   By: Lavonia Dana M.D.   On: 05/22/2017 15:07   US Renal  Result Date: 05/23/2017 CLINICAL DATA:  Acute kidney injury. EXAM: RENAL / URINARY TRACT ULTRASOUND COMPLETE COMPARISON:  06/23/2013 FINDINGS: Right Kidney: Length: 10.2 cm. No hydronephrosis. Cortical echogenicity appears slightly increased relative to the index liver. Left Kidney: Length: 10.3 cm. Probable increased echogenicity. No hydronephrosis. Bladder: Appears normal for degree of bladder distention. IMPRESSION: 1. No evidence for hydronephrosis. 2. Bilateral increased renal cortical echogenicity suggests medical renal disease. Electronically Signed   By: Misty Stanley M.D.   On: 05/23/2017 09:29        Scheduled Meds: . alfuzosin  10 mg Oral Daily  . aspirin EC  81 mg Oral Daily  . feeding supplement (ENSURE ENLIVE)  237 mL Oral BID BM  . furosemide  80  mg Oral Daily  . heparin  5,000 Units Subcutaneous Q8H  . insulin aspart  0-9 Units Subcutaneous TID WC  . isosorbide mononitrate  60 mg Oral Daily  . levothyroxine  150 mcg Oral QAC breakfast  . methylPREDNISolone (SOLU-MEDROL) injection  60 mg Intravenous Q12H  . metoprolol succinate  50 mg Oral Daily  . mometasone-formoterol  2 puff Inhalation BID  . pantoprazole  40 mg Oral Daily  . rosuvastatin  40 mg Oral QHS   Continuous Infusions: . azithromycin (ZITHROMAX) 500 MG IVPB    . cefTRIAXone (ROCEPHIN)  IV       LOS: 0 days    Time spent: 52min    Domenic Polite, MD [facility] Pager [phone]  If 7PM-7AM, please contact night-coverage [email or web address] Password [email or web address] 05/23/2017, 12:54 PM

## 2017-05-23 NOTE — Progress Notes (Signed)
Physical Therapy Evaluation AND Discharge  Patient Details Name: [name] MRN: [id number] DOB: Feb 16, 1930 Today's Date: 05/23/2017   History of Present Illness  Pt. is a 83 y.o. male with a PMHx of COPD, CAD s/p stenting, chronic diastolic CHF who presented 8/9 to the ED for shortness of breath and chills. Pt admitted for pneumonia.   Clinical Impression  Pt evaluated by PT with no further acute PT needs identified. All education has been completed and the pt has no further questions. See below for any follow-up PT or equipment needs. PT is signing off. Thank you for this referral. Pt tolerated session well and was able to ambulate 300 ft with SPO2 remaining above 89% on room air. Pt reports feeling well and is ready to get out of the hospital.     Follow Up Recommendations No PT follow up    Equipment Recommendations  None recommended by PT    Recommendations for Other Services       Precautions / Restrictions Precautions Precaution Comments: Monitor sats Restrictions Weight Bearing Restrictions: No      Mobility  Bed Mobility               General bed mobility comments: Pt OOB in recliner when PT arrived   Transfers Overall transfer level: Needs assistance Equipment used: None Transfers: Sit to/from Stand Sit to Stand: Supervision         General transfer comment: Supervision provided for safety, pt was eager to stand   Ambulation/Gait Ambulation/Gait assistance: Min guard Ambulation Distance (Feet): 300 Feet Assistive device: None Gait Pattern/deviations: WFL(Within Functional Limits)     General Gait Details: SPO2 remained above 89% on room air while ambulating  Stairs            Wheelchair Mobility    Modified Rankin (Stroke Patients Only)       Balance Overall balance assessment: Independent                                           Pertinent Vitals/Pain Pain Assessment: No/denies pain    Home Living  Family/patient expects to be discharged to:: Private residence Living Arrangements: Spouse/significant other Available Help at Discharge: Family Type of Home: House Home Access: Stairs to enter   Technical brewer of Steps: 3-4 Home Layout: One level Home Equipment: None      Prior Function Level of Independence: Independent         Comments: Pt states he has never used AD and doesn't need one      Hand Dominance        Extremity/Trunk Assessment   Upper Extremity Assessment Upper Extremity Assessment: Overall WFL for tasks assessed    Lower Extremity Assessment Lower Extremity Assessment: Overall WFL for tasks assessed       Communication   Communication: No difficulties  Cognition Arousal/Alertness: Awake/alert Behavior During Therapy: WFL for tasks assessed/performed Overall Cognitive Status: Within Functional Limits for tasks assessed                                        General Comments      Exercises     Assessment/Plan    PT Assessment Patent does not need any further PT services  PT Problem List  PT Treatment Interventions      PT Goals (Current goals can be found in the Care Plan section)  Acute Rehab PT Goals PT Goal Formulation: All assessment and education complete, DC therapy    Frequency     Barriers to discharge        Co-evaluation               AM-PAC PT "6 Clicks" Daily Activity  Outcome Measure Difficulty turning over in bed (including adjusting bedclothes, sheets and blankets)?: None Difficulty moving from lying on back to sitting on the side of the bed? : None Difficulty sitting down on and standing up from a chair with arms (e.g., wheelchair, bedside commode, etc,.)?: None Help needed moving to and from a bed to chair (including a wheelchair)?: A Little Help needed walking in hospital room?: A Little Help needed climbing 3-5 steps with a railing? : A Little 6 Click Score: 21     End of Session Equipment Utilized During Treatment: Gait belt Activity Tolerance: Patient tolerated treatment well Patient left: in chair;with family/visitor present;with call bell/phone within reach Nurse Communication: Mobility status (RN observed ambulation in hall ) PT Visit Diagnosis: Difficulty in walking, not elsewhere classified (R26.2)    Time: 3009-2330 PT Time Calculation (min) (ACUTE ONLY): 13 min   Charges:   PT Evaluation $PT Eval Low Complexity: 1 Low     PT G Codes:   PT G-Codes **NOT FOR INPATIENT CLASS** Functional Assessment Tool Used: AM-PAC 6 Clicks Basic Mobility;Clinical judgement Functional Limitation: Mobility: Walking and moving around Mobility: Walking and Moving Around Current Status (Q7622): At least 1 percent but less than 20 percent impaired, limited or restricted Mobility: Walking and Moving Around Goal Status [phone]): At least 1 percent but less than 20 percent impaired, limited or restricted Mobility: Walking and Moving Around Discharge Status [phone]): At least 1 percent but less than 20 percent impaired, limited or restricted    Olegario Shearer, SPT   Reino Bellis 05/23/2017, 3:49 PM

## 2017-05-23 NOTE — Care Management Note (Signed)
Case Management Note  Patient Details  Name: [name] MRN: [id number] Date of Birth: 11/23/1929  Subjective/Objective:  lll pna and respiratory distress                  Action/Plan: Date:  May 23, 2017 Chart reviewed for concurrent status and case management needs. Will continue to follow patient progress. Discharge Planning: following for needs/lives at home with dtg's. Expected discharge date: [id number] Velva Harman, BSN, [place], [place] Expected Discharge Date:   (unknown)               Expected Discharge Plan:  Home/Self Care  In-House Referral:  NA  Discharge planning Services  CM Consult  Post Acute Care Choice:  NA Choice offered to:  NA  DME Arranged:  N/A DME Agency:  NA  HH Arranged:  NA HH Agency:  NA  Status of Service:  In process, will continue to follow  If discussed at Long Length of Stay Meetings, dates discussed:    Additional Comments:  Leeroy Cha, RN 05/23/2017, 8:49 AM

## 2017-05-23 NOTE — Progress Notes (Signed)
Nutrition Brief Note  Patient identified on the Malnutrition Screening Tool (MST) Report  Wt Readings from Last 15 Encounters:  05/22/17 206 lb (93.4 kg)  03/11/17 218 lb 3.2 oz (99 kg)  01/14/17 215 lb 1.9 oz (97.6 kg)  12/12/16 221 lb (100.2 kg)  12/10/16 209 lb (94.8 kg)  12/05/16 221 lb 12.8 oz (100.6 kg)  11/12/16 214 lb (97.1 kg)  11/05/16 214 lb (97.1 kg)  10/29/16 215 lb (97.5 kg)  10/22/16 215 lb (97.5 kg)  10/21/16 214 lb (97.1 kg)  10/16/16 220 lb (99.8 kg)  09/22/16 212 lb 1.6 oz (96.2 kg)  05/19/16 202 lb (91.6 kg)  03/08/16 204 lb (92.5 kg)    Body mass index is 34.28 kg/m. Patient meets criteria for obese based on current BMI.   Current diet order is heart healthy/carb modified, patient is consuming approximately 100% of meals at this time. Labs and medications reviewed.   No nutrition interventions warranted at this time. If nutrition issues arise, please consult RD.   Mariana Single RD, LDN Clinical Nutrition Pager # [phone]

## 2017-05-23 NOTE — Progress Notes (Signed)
Pt. set up/placed on CPAP per home regimen with humidity filled pressure set at 8 cmh20, remains on air-91%, tolerating well, RT to monitor.

## 2017-05-23 NOTE — Progress Notes (Signed)
*  PRELIMINARY RESULTS* Vascular Ultrasound Lower extremity venous duplex has been completed.  Preliminary findings: No evidence of DVT or baker's cyst.  Landry Mellow, RDMS, RVT  05/23/2017, 9:27 AM

## 2017-05-24 DIAGNOSIS — N289 Disorder of kidney and ureter, unspecified: Secondary | ICD-10-CM

## 2017-05-24 DIAGNOSIS — J181 Lobar pneumonia, unspecified organism: Secondary | ICD-10-CM

## 2017-05-24 DIAGNOSIS — I5032 Chronic diastolic (congestive) heart failure: Secondary | ICD-10-CM | POA: Diagnosis not present

## 2017-05-24 DIAGNOSIS — J441 Chronic obstructive pulmonary disease with (acute) exacerbation: Secondary | ICD-10-CM | POA: Diagnosis not present

## 2017-05-24 DIAGNOSIS — N179 Acute kidney failure, unspecified: Secondary | ICD-10-CM | POA: Diagnosis not present

## 2017-05-24 LAB — CBC
HEMATOCRIT: 44.9 % (ref 39.0–52.0)
Hemoglobin: 14.8 g/dL (ref 13.0–17.0)
MCH: 28.7 pg (ref 26.0–34.0)
MCHC: 33 g/dL (ref 30.0–36.0)
MCV: 87.2 fL (ref 78.0–100.0)
PLATELETS: 145 10*3/uL — AB (ref 150–400)
RBC: 5.15 MIL/uL (ref 4.22–5.81)
RDW: 14.3 % (ref 11.5–15.5)
WBC: 19.9 10*3/uL — ABNORMAL HIGH (ref 4.0–10.5)

## 2017-05-24 LAB — BASIC METABOLIC PANEL
Anion gap: 12 (ref 5–15)
BUN: 31 mg/dL — ABNORMAL HIGH (ref 6–20)
CALCIUM: 9.5 mg/dL (ref 8.9–10.3)
CO2: 29 mmol/L (ref 22–32)
Chloride: 99 mmol/L — ABNORMAL LOW (ref 101–111)
Creatinine, Ser: 1.3 mg/dL — ABNORMAL HIGH (ref 0.61–1.24)
GFR calc Af Amer: 56 mL/min — ABNORMAL LOW (ref >60)
GFR calc non Af Amer: 48 mL/min — ABNORMAL LOW (ref >60)
GLUCOSE: 173 mg/dL — AB (ref 65–99)
Potassium: 4.1 mmol/L (ref 3.5–5.1)
Sodium: 140 mmol/L (ref 135–145)

## 2017-05-24 LAB — GLUCOSE, CAPILLARY: Glucose-Capillary: 163 mg/dL — ABNORMAL HIGH (ref 65–99)

## 2017-05-24 LAB — URINE CULTURE

## 2017-05-24 MED ORDER — CEFDINIR 300 MG PO CAPS: 300.0000 mg | ORAL_CAPSULE | Freq: Two times a day (BID) | ORAL | 0 refills | Status: DC

## 2017-05-24 MED ORDER — METOPROLOL SUCCINATE ER 50 MG PO TB24
50.0000 mg | ORAL_TABLET | Freq: Every day | ORAL | Status: DC
  Administered 2017-05-24: 50 mg via ORAL
  Filled 2017-05-24: qty 1

## 2017-05-24 MED ORDER — PREDNISONE 50 MG PO TABS
50.0000 mg | ORAL_TABLET | Freq: Every day | ORAL | Status: DC
  Administered 2017-05-24: 50 mg via ORAL
  Filled 2017-05-24: qty 1

## 2017-05-24 MED ORDER — PREDNISONE 20 MG PO TABS: 20.0000 mg | ORAL_TABLET | Freq: Every day | ORAL | 0 refills | Status: DC

## 2017-05-24 NOTE — Discharge Summary (Signed)
Physician Discharge Summary  [name] SLH:[id number] DOB: 09/15/1930 DOA: 05/22/2017  PCP: Eddie Neer, MD  Admit date: 05/22/2017 Discharge date: 05/24/2017  Time spent: 35 minutes  Recommendations for Outpatient Follow-up:  1. PCP [name] in 1 week 2. FU CXR in 4-6weeks 3. Please ensure compliance with diuretics   Discharge Diagnoses:  Principal Problem:   LLL pneumonia (HCC)   COPD exacerbation   HYPERTENSION, BENIGN   Coronary artery disease   OSA (obstructive sleep apnea)   Obesity (BMI 30-39.9)   COPD GOLD III   Chronic diastolic CHF (congestive heart failure) (HCC)   COPD exacerbation (HCC)   History of DVT of lower extremity   Acute kidney injury [facility])   Discharge Condition: stable  Diet recommendation: low sodium heart healthy  Filed Weights   05/22/17 1349 05/22/17 2036 05/22/17 2038  Weight: 93.4 kg (206 lb) 93.4 kg (206 lb) 93.4 kg (206 lb)    History of present illness:  Eddie Newbury Headenis a 83 y.o.malewith a history of COPD, CAD s/p stenting, chronic diastolic CHF who presented 8/9 to the ED for shortness of breath and chills.  He reported shortness of breath worsening over the past few days with chills, fever.  On their arrival he was tachycardic, hypoxic to 88% on room air with increased work of breathing, improved to 94% on 3L in the ED. WBC 11.7, normal lactic acid, creatinine slightly up from baseline at 1.4, UA negative, and an infiltrate at the left lung base was noted on CXR  Hospital Course:   Acute hypoxic respiratory failure -due to pneumonia and COPD exacerbation -treated with IV Ceftriaxone/azithromycin, steroids, nebs -cultures negative -clinically improved significantly and transitioned to oral Abx/prednisone and discharged home  Sepsis due to community-acquired LLL pneumonia:  -improved, treated as above  COPD with acute exacerbation:  -improved, see above  DM type 2 -stable, resumed amaryl at discharge  AKI vs. CKD  stage III: -unknown baseline, resolved with hydration,  -Renal US no hydronephrosis  Chronic diastolic CHF:  -followed by [name]. Last office visit weight was 218lbs. Daughter reports nonadherence for diuretics. Weight on admission 206lbs. BNP normal.  - resumed PO lasix and advised compliance  CAD: s/p PCI of RCA in 2010.  - No chest pain. Troponin negative, nonischemic ECG - Continue ASa/toprol/statin  Severe OSA:  - CPAP qHS   Hypothyroidism:  - Continue synthroid  Essential HTN:  - Continue metoprolol and imdur  Discharge Exam: Vitals:   05/24/17 0610 05/24/17 0944  BP: (!) 146/74   Pulse: 96   Resp: 20   Temp: 97.8 F (36.6 C)   SpO2: 97% 92%    General: AAOx3 Cardiovascular: S1S2/RRR Respiratory: improved air movement, no wheezing  Discharge Instructions   Discharge Instructions    Diet - low sodium heart healthy    Complete by:  As directed    Diet Carb Modified    Complete by:  As directed    Increase activity slowly    Complete by:  As directed      Discharge Medication List as of 05/24/2017 12:01 PM    START taking these medications   Details  cefdinir (OMNICEF) 300 MG capsule Take 1 capsule (300 mg total) by mouth 2 (two) times daily. For 5days, Starting Sat 05/24/2017, Print    predniSONE (DELTASONE) 20 MG tablet Take 1-2 tablets (20-40 mg total) by mouth daily with breakfast. Take 40mg  daily for 2days then 20mg  daily for 2days then STOP, Starting Sat 05/24/2017, Print  CONTINUE these medications which have NOT CHANGED   Details  albuterol (PROVENTIL HFA;VENTOLIN HFA) 108 (90 BASE) MCG/ACT inhaler Inhale 2 puffs into the lungs every 6 (six) hours as needed for wheezing or shortness of breath. , Historical Med    albuterol (PROVENTIL) (2.5 MG/3ML) 0.083% nebulizer solution Take 3 mLs (2.5 mg total) by nebulization every 4 (four) hours as needed for wheezing or shortness of breath., Starting Wed 09/25/2016, Print    alfuzosin  (UROXATRAL) 10 MG 24 hr tablet Take 10 mg by mouth daily. At 5 pm, Historical Med    aspirin EC 81 MG tablet Take 81 mg by mouth daily. At 5 pm, Historical Med    budesonide-formoterol (SYMBICORT) 160-4.5 MCG/ACT inhaler Inhale 2 puffs into the lungs 2 (two) times daily., Historical Med    glimepiride (AMARYL) 2 MG tablet Take 2 mg by mouth daily. At 5 pm, Historical Med    isosorbide mononitrate (IMDUR) 60 MG 24 hr tablet Take 60 mg by mouth daily. At 5 pm, Historical Med    levothyroxine (SYNTHROID, LEVOTHROID) 150 MCG tablet Take 150 mcg by mouth daily before breakfast., Starting Mon 05/19/2017, Historical Med    loperamide (IMODIUM) 2 MG capsule Take 2 mg by mouth as needed for diarrhea or loose stools (Take as directed). , Historical Med    metoprolol succinate (TOPROL-XL) 50 MG 24 hr tablet TAKE 1 TABLET BY MOUTH EVERY DAY, Normal    pantoprazole (PROTONIX) 40 MG tablet Take 40 mg by mouth daily. At 5 pm, Historical Med    rosuvastatin (CRESTOR) 40 MG tablet Take 1 tablet (40 mg total) by mouth at bedtime., Starting Fri 12/06/2016, Until Mon 12/01/2017, Normal    furosemide (LASIX) 40 MG tablet Take 2 tablets (80 mg total) by mouth every evening., Starting Tue 01/14/2017, Until Mon 04/14/2017, Normal      STOP taking these medications     meloxicam (MOBIC) 15 MG tablet        Allergies  Allergen Reactions  . Clopidogrel Itching   Follow-up Information    Eddie Neer, MD. Schedule an appointment as soon as possible for a visit in 1 week(s).   Specialty:  Family Medicine Contact information: [address] [place] [address] [phone]            The results of significant diagnostics from this hospitalization (including imaging, microbiology, ancillary and laboratory) are listed below for reference.    Significant Diagnostic Studies: Dg Chest 2 View  Result Date: 05/22/2017 CLINICAL DATA:  Sitting shortness of breath and wheezing for 3 days,  history COPD, asthma, CHF, coronary artery disease post stenting, diabetes mellitus, hypertension EXAM: CHEST  2 VIEW COMPARISON:  10/17/2016 FINDINGS: Borderline enlargement of cardiac silhouette. Mediastinal contours and pulmonary vascularity normal. LEFT basilar infiltrate question pneumonia. Linear scarring RIGHT base. Probable calcified granuloma LEFT mid lung. Remaining lungs clear. No pleural effusion or pneumothorax. Bones unremarkable. IMPRESSION: LEFT basilar infiltrate question pneumonia. Old granulomatous disease and minimal RIGHT basilar scarring. Electronically Signed   By: Lavonia Dana M.D.   On: 05/22/2017 15:07   Ct Head Wo Contrast  Result Date: 05/16/2017 CLINICAL DATA:  Headache x3 weeks EXAM: CT HEAD WITHOUT CONTRAST TECHNIQUE: Contiguous axial images were obtained from the base of the skull through the vertex without intravenous contrast. COMPARISON:  MRI brain dated 04/14/2006 FINDINGS: Motion degraded images. Brain: No evidence of acute infarction, hemorrhage, hydrocephalus, extra-axial collection or mass lesion/mass effect. Subcortical white matter and periventricular small vessel ischemic  changes, particularly in the subcortical right frontal lobe. Vascular: Mild intracranial atherosclerosis. Skull: Normal. Negative for fracture or focal lesion. Sinuses/Orbits: The visualized paranasal sinuses are essentially clear. The mastoid air cells are unopacified. Other: None. IMPRESSION: No evidence of acute intracranial abnormality. Small vessel ischemic changes. Electronically Signed   By: Julian Hy M.D.   On: 05/16/2017 18:00   US Renal  Result Date: 05/23/2017 CLINICAL DATA:  Acute kidney injury. EXAM: RENAL / URINARY TRACT ULTRASOUND COMPLETE COMPARISON:  06/23/2013 FINDINGS: Right Kidney: Length: 10.2 cm. No hydronephrosis. Cortical echogenicity appears slightly increased relative to the index liver. Left Kidney: Length: 10.3 cm. Probable increased echogenicity. No  hydronephrosis. Bladder: Appears normal for degree of bladder distention. IMPRESSION: 1. No evidence for hydronephrosis. 2. Bilateral increased renal cortical echogenicity suggests medical renal disease. Electronically Signed   By: Misty Stanley M.D.   On: 05/23/2017 09:29    Microbiology: Recent Results (from the past 240 hour(s))  Blood Culture (routine x 2)     Status: None (Preliminary result)   Collection Time: 05/22/17  1:46 PM  Result Value Ref Range Status   Specimen Description BLOOD LEFT HAND  Final   Special Requests IN PEDIATRIC BOTTLE Blood Culture adequate volume  Final   Culture   Final    NO GROWTH 2 DAYS Performed at [facility], [address] [place], [place] [address]    Report Status PENDING  Incomplete  Blood Culture (routine x 2)     Status: None (Preliminary result)   Collection Time: 05/22/17  1:55 PM  Result Value Ref Range Status   Specimen Description BLOOD LEFT ANTECUBITAL  Final   Special Requests   Final    BOTTLES DRAWN AEROBIC AND ANAEROBIC Blood Culture adequate volume   Culture   Final    NO GROWTH 2 DAYS Performed at [facility], [place] [name] [place], [place] [address]    Report Status PENDING  Incomplete  Urine culture     Status: Abnormal   Collection Time: 05/22/17  2:03 PM  Result Value Ref Range Status   Specimen Description URINE, CLEAN CATCH  Final   Special Requests NONE  Final   Culture (A)  Final    <10,000 COLONIES/mL INSIGNIFICANT GROWTH Performed at [facility], [place] [address] [place], [place] [address]    Report Status 05/24/2017 FINAL  Final     Labs: Basic Metabolic Panel:  Recent Labs Lab 05/22/17 1355 05/23/17 0554 05/24/17 0620  NA 138 139 140  K 4.0 4.0 4.1  CL 99* 103 99*  CO2 31 26 29   GLUCOSE 147* 176* 173*  BUN 21* 19 31*  CREATININE 1.41* 1.17 1.30*  CALCIUM 9.4 9.1 9.5   Liver Function Tests:  Recent Labs Lab 05/22/17 1355  AST 31  ALT 21  ALKPHOS 43  BILITOT 1.1   PROT 7.9  ALBUMIN 4.6   No results for input(s): LIPASE, AMYLASE in the last 168 hours. No results for input(s): AMMONIA in the last 168 hours. CBC:  Recent Labs Lab 05/22/17 1355 05/23/17 0554 05/24/17 0620  WBC 11.7* 16.3* 19.9*  NEUTROABS 10.1*  --   --   HGB 14.4 14.3 14.8  HCT 44.5 44.6 44.9  MCV 88.6 87.5 87.2  PLT 130* 132* 145*   Cardiac Enzymes: No results for input(s): CKTOTAL, CKMB, CKMBINDEX, TROPONINI in the last 168 hours. BNP: BNP (last 3 results)  Recent Labs  09/22/16 1853 05/22/17 1355  BNP 18.3  23.3    ProBNP (last 3 results) No results for input(s): PROBNP in the last 8760 hours.  CBG:  Recent Labs Lab 05/23/17 0739 05/23/17 1144 05/23/17 1703 05/23/17 2218 05/24/17 0740  GLUCAP 135* 147* 168* 145* 163*       SignedDomenic Polite MD.  [facility] 05/24/2017, 2:21 PM

## 2017-05-24 NOTE — Progress Notes (Signed)
Pt leaving this afternoon with his daughter. Alert, oriented, and without c/o. Discharge instructions/prescriptions given/explained with pt verbalizing understanding.  Followup appt noted.

## 2017-05-26 DIAGNOSIS — G4733 Obstructive sleep apnea (adult) (pediatric): Secondary | ICD-10-CM | POA: Diagnosis not present

## 2017-05-26 DIAGNOSIS — J440 Chronic obstructive pulmonary disease with acute lower respiratory infection: Secondary | ICD-10-CM | POA: Diagnosis not present

## 2017-05-27 LAB — CULTURE, BLOOD (ROUTINE X 2)
Culture: NO GROWTH
Culture: NO GROWTH
SPECIAL REQUESTS: ADEQUATE
Special Requests: ADEQUATE

## 2017-06-05 DIAGNOSIS — J180 Bronchopneumonia, unspecified organism: Secondary | ICD-10-CM | POA: Diagnosis not present

## 2017-06-05 DIAGNOSIS — J441 Chronic obstructive pulmonary disease with (acute) exacerbation: Secondary | ICD-10-CM | POA: Diagnosis not present

## 2017-06-05 DIAGNOSIS — N179 Acute kidney failure, unspecified: Secondary | ICD-10-CM | POA: Diagnosis not present

## 2017-06-05 DIAGNOSIS — I110 Hypertensive heart disease with heart failure: Secondary | ICD-10-CM | POA: Diagnosis not present

## 2017-06-19 ENCOUNTER — Ambulatory Visit: Payer: Medicare HMO | Admitting: Cardiology

## 2017-06-23 DIAGNOSIS — I5032 Chronic diastolic (congestive) heart failure: Secondary | ICD-10-CM | POA: Diagnosis not present

## 2017-06-26 DIAGNOSIS — J440 Chronic obstructive pulmonary disease with acute lower respiratory infection: Secondary | ICD-10-CM | POA: Diagnosis not present

## 2017-06-26 DIAGNOSIS — G4733 Obstructive sleep apnea (adult) (pediatric): Secondary | ICD-10-CM | POA: Diagnosis not present

## 2017-06-30 ENCOUNTER — Encounter: Payer: Self-pay | Admitting: Cardiology

## 2017-06-30 ENCOUNTER — Ambulatory Visit (INDEPENDENT_AMBULATORY_CARE_PROVIDER_SITE_OTHER): Payer: Medicare HMO | Admitting: Cardiology

## 2017-06-30 VITALS — BP 126/70 | HR 86 | Ht 65.0 in | Wt 209.2 lb

## 2017-06-30 DIAGNOSIS — G4733 Obstructive sleep apnea (adult) (pediatric): Secondary | ICD-10-CM | POA: Diagnosis not present

## 2017-06-30 DIAGNOSIS — I10 Essential (primary) hypertension: Secondary | ICD-10-CM | POA: Diagnosis not present

## 2017-06-30 DIAGNOSIS — I2510 Atherosclerotic heart disease of native coronary artery without angina pectoris: Secondary | ICD-10-CM

## 2017-06-30 DIAGNOSIS — I5032 Chronic diastolic (congestive) heart failure: Secondary | ICD-10-CM | POA: Diagnosis not present

## 2017-06-30 DIAGNOSIS — E785 Hyperlipidemia, unspecified: Secondary | ICD-10-CM | POA: Diagnosis not present

## 2017-06-30 MED ORDER — FUROSEMIDE 20 MG PO TABS: 20.0000 mg | ORAL_TABLET | Freq: Every day | ORAL | 3 refills | Status: DC

## 2017-06-30 NOTE — Patient Instructions (Addendum)
Medication Instructions:  START Lasix (Furosemide) 20 mg once daily   Labwork: TODAY - cholesterol, liver panel  Your physician recommends that you return for lab work in: 1 week for basic metabolic panel   Testing/Procedures: None Ordered   Follow-Up: Your physician wants you to follow-up in: 6 months with Dr. Radford Pax.   You will receive a reminder letter in the mail two months in advance. If you don't receive a letter, please call our office to schedule the follow-up appointment.   If you need a refill on your cardiac medications before your next appointment, please call your pharmacy.   Thank you for choosing [facility]! Christen Bame, RN [phone]

## 2017-06-30 NOTE — Progress Notes (Signed)
Cardiology Office Note:    Date:  06/30/2017   ID:  [name], DOB January 08, 1930, MRN [id number]  PCP:  [name], MD  Cardiologist:  Fransico Him, MD   Referring MD: [name], MD   Chief Complaint  Patient presents with  . Coronary Artery Disease  . Hypertension  . Sleep Apnea  . Hyperlipidemia    History of Present Illness:    [name] is a 83 y.o. male with a hx of ASCAD (s/p PCI of RCA 03/2009 with residual 70% ramus and 90% RCA, cath 01/2011 showed patent stents in RCA with moderate prox disesae, aneurysmal left circ and small 90% ramus s/p PCI), HTN, dyslipidemia, chronic diastolic CHF, obesity and OSA on CPAP.  He is here today for followup.  He is doing well but was recently hospitalized for LLL PNA/COPD exacerbation treated with antibx.  He has a history of noncompliance with sodium and medications.  He is here today for followup.  He has had some problems with chest discomfort when he gets up in the am that he describes as a sharp pain that lasts a few seconds and he says that it feels that it is gas pain and not like his anginal CP.  His SOB has improved with treatment of his PNA.  He has chronic LE edema that is controlled on diuretics and are much improved today.  He denies any PND or orthopnea, dizziness, palpitations or syncope.  Apparently Dr. Brigitte Pulse stopped his diuretics since he had lost so much weight.   He is doing well with his CPAP device but does not always use it because he says that he sleeps better without the device.  He does not snore when he uses the CPAP.  He does not notice much difference in how he feels in the am if he has used it or not used it.     Past Medical History:  Diagnosis Date  . Anxiety   . Asthma   . Chronic diastolic CHF (congestive heart failure) (HCC)    diastolic   . COPD (chronic obstructive pulmonary disease) ([place])   . Coronary artery disease    s/p PCI of RCA 03/2009 at which time there was 70% ramus and 90% RCA,  cath 01/2011 showed patent stents in RCA with moderate prox disesae, aneurysmal left circ and small 90% ramus s/p PCI, patent LAD EF 55%  . Diabetes mellitus   . Diverticulitis Oct. 2013   bleeding in the past and Effient for his CAD was stopped  . DVT (deep venous thrombosis) ([place])   . GERD (gastroesophageal reflux disease)   . Hypercholesterolemia   . Hypertension   . Hypothyroidism   . Obesity (BMI 30-39.9)   . OSA (obstructive sleep apnea)    severe on CPAP  . Peripheral vascular disease ([place]) 11/2006   s/p left stent  . Shortness of breath   . Sleep apnea    severe OSA awaiting CPAP titration    Past Surgical History:  Procedure Laterality Date  . ABDOMINAL AORTAGRAM N/A 04/20/2012   Procedure: ABDOMINAL Maxcine Ham;  Surgeon: Angelia Mould, MD;  Location: [facility] CATH LAB;  Service: Cardiovascular;  Laterality: N/A;  . ABDOMINAL AORTAGRAM N/A 12/29/2012   Procedure: ABDOMINAL Maxcine Ham;  Surgeon: Serafina Mitchell, MD;  Location: [facility] CATH LAB;  Service: Cardiovascular;  Laterality: N/A;  . BALLOON DILATION  12/26/2011   Procedure: BALLOON DILATION;  Surgeon: Garlan Fair, MD;  Location: [facility] ENDOSCOPY;  Service: Endoscopy;  Laterality: N/A;  . COLONOSCOPY  08/14/2012   Procedure: COLONOSCOPY;  Surgeon: Arta Silence, MD;  Location: [facility] ENDOSCOPY;  Service: Endoscopy;  Laterality: N/A;  . CORONARY STENT PLACEMENT  2010 and 2012  . ESOPHAGOGASTRODUODENOSCOPY  12/26/2011   Procedure: ESOPHAGOGASTRODUODENOSCOPY (EGD);  Surgeon: Garlan Fair, MD;  Location: [facility];  Service: Endoscopy;  Laterality: N/A;  . ESOPHAGOGASTRODUODENOSCOPY  08/13/2012   Procedure: ESOPHAGOGASTRODUODENOSCOPY (EGD);  Surgeon: Arta Silence, MD;  Location: [facility] ENDOS[facility];  Service: Endoscopy;  Laterality: Left;  . HERNIA REPAIR  1992   evntral hernia repair  . LOWER EXTREMITY ANGIOGRAM Bilateral 12/29/2012   Procedure: LOWER EXTREMITY ANGIOGRAM;  Surgeon: Serafina Mitchell, MD;  Location: [facility] CATH LAB;   Service: Cardiovascular;  Laterality: Bilateral;  bilat lower extrem angio    Current Medications: Current Meds  Medication Sig  . albuterol (PROVENTIL HFA;VENTOLIN HFA) 108 (90 BASE) MCG/ACT inhaler Inhale 2 puffs into the lungs every 6 (six) hours as needed for wheezing or shortness of breath.   Marland Kitchen albuterol (PROVENTIL) (2.5 MG/3ML) 0.083% nebulizer solution Take 3 mLs (2.5 mg total) by nebulization every 4 (four) hours as needed for wheezing or shortness of breath.  . alfuzosin (UROXATRAL) 10 MG 24 hr tablet Take 10 mg by mouth daily. At 5 pm  . aspirin EC 81 MG tablet Take 81 mg by mouth daily. At 5 pm  . budesonide-formoterol (SYMBICORT) 160-4.5 MCG/ACT inhaler Inhale 2 puffs into the lungs 2 (two) times daily.  Marland Kitchen glimepiride (AMARYL) 2 MG tablet Take 2 mg by mouth daily. At 5 pm  . isosorbide mononitrate (IMDUR) 60 MG 24 hr tablet Take 60 mg by mouth daily. At 5 pm  . levothyroxine (SYNTHROID, LEVOTHROID) 150 MCG tablet Take 150 mcg by mouth daily before breakfast.  . loperamide (IMODIUM) 2 MG capsule Take 2 mg by mouth as needed for diarrhea or loose stools (Take as directed).   . metoprolol succinate (TOPROL-XL) 50 MG 24 hr tablet TAKE 1 TABLET BY MOUTH EVERY DAY  . nitroGLYCERIN (NITROSTAT) 0.4 MG SL tablet Place 0.4 mg under the tongue every 5 (five) minutes as needed for chest pain.  . pantoprazole (PROTONIX) 40 MG tablet Take 40 mg by mouth daily. At 5 pm  . rosuvastatin (CRESTOR) 40 MG tablet Take 1 tablet (40 mg total) by mouth at bedtime. (Patient taking differently: Take 40 mg by mouth daily. At 5 pm)     Allergies:   Clopidogrel   Social History   Social History  . Marital status: Widowed    Spouse name: N/A  . Number of children: N/A  . Years of education: N/A   Social History Main Topics  . Smoking status: Former Smoker    Packs/day: 0.50    Years: 15.00    Types: Cigarettes, Cigars    Quit date: 10/15/1963  . Smokeless tobacco: Never Used  . Alcohol use No  .  Drug use: No  . Sexual activity: No   Other Topics Concern  . None   Social History Narrative  . None     Family History: The patient's family history includes Asthma in his sister; Breast cancer in his sister; Diabetes in his brother, mother, and sister; Heart attack in his mother and sister; Heart disease in his father, mother, and sister; Hyperlipidemia in his mother, sister, and sister; Hypertension in his mother, sister, and sister. There is no history of Malignant hyperthermia.  ROS:   Please see the history of present illness.  All other systems reviewed and are negative.  EKGs/Labs/Other Studies Reviewed:    The following studies were reviewed today: Hospital followup  EKG:  EKG is not ordered today.    Recent Labs: 09/24/2016: Magnesium 1.9 12/05/2016: TSH 3.930 05/22/2017: ALT 21; B Natriuretic Peptide 23.3 05/24/2017: BUN 31; Creatinine, Ser 1.30; Hemoglobin 14.8; Platelets 145; Potassium 4.1; Sodium 140   Recent Lipid Panel    Component Value Date/Time   CHOL 131 12/05/2016 0957   TRIG 74 12/05/2016 0957   HDL 39 (L) 12/05/2016 0957   CHOLHDL 3.4 12/05/2016 0957   CHOLHDL 4 11/16/2014 1124   VLDL 19.8 11/16/2014 1124   LDLCALC 77 12/05/2016 0957    Physical Exam:    VS:  BP 126/70   Pulse 86   Ht 5\' 5"  (1.651 m)   Wt 209 lb 3.2 oz (94.9 kg)   BMI 34.81 kg/m     Wt Readings from Last 3 Encounters:  06/30/17 209 lb 3.2 oz (94.9 kg)  05/22/17 206 lb (93.4 kg)  03/11/17 218 lb 3.2 oz (99 kg)     GEN:  Well nourished, well developed in no acute distress HEENT: Normal NECK: No JVD; No carotid bruits LYMPHATICS: No lymphadenopathy CARDIAC: RRR, no murmurs, rubs, gallops RESPIRATORY:  Clear to auscultation without rales, wheezing or rhonchi  ABDOMEN: Soft, non-tender, non-distended MUSCULOSKELETAL:   No deformity; trace edema LE SKIN: Warm and dry NEUROLOGIC:  Alert and oriented x 3 PSYCHIATRIC:  Normal affect   ASSESSMENT:    1. Coronary  artery disease involving native coronary artery of native heart without angina pectoris   2. Chronic diastolic CHF (congestive heart failure) ([place])   3. HYPERTENSION, BENIGN   4. OSA (obstructive sleep apnea)   5. Dyslipidemia    PLAN:    In order of problems listed above:  1.  ASCAD - s/p PCI of RCA 03/2009 at which time there was 70% ramus and 90% RCA, cath 01/2011 showed patent stents in RCA with moderate prox disesae, aneurysmal left circ and small 90% ramus s/p PCI with EF 55%.  He denies any anginal symptoms at present.  He will continue ASA 81mg  daily, Imdur 60mg  daily and statin.   2.  Chronic diastolic CHF - he appears euvolemic on exam today.  His weight is stable and he has actually lost 9lbs since the Spring. His diuretic was stopped by his PCP because of weight loss but he still has mild edema on exam.  I will restart Lasix 20mg  daily and repeat a BMET in 1 week.   3.  HTN - BP is well controlled on exam today. He will continue on Toprol XL 50mg  daily.  4.  OSA - the patient is tolerating PAP therapy well without any problems. I will get a download from his DME.   The patient has been using and benefiting from CPAP use and will continue to benefit from therapy.   5.    Dyslipidemia - LDL goal < 70 - he will continue on Rosuvastatin 40mg  daily.  I will repeat an FLP and ALT.     Medication Adjustments/Labs and Tests Ordered: Current medicines are reviewed at length with the patient today.  Concerns regarding medicines are outlined above.  No orders of the defined types were placed in this encounter.  No orders of the defined types were placed in this encounter.   Signed, Fransico Him, MD  06/30/2017 1:42 PM    [place]

## 2017-06-30 NOTE — Progress Notes (Deleted)
Cardiology Office Note:    Date:  06/30/2017   ID:  [name], DOB 1930-09-11, MRN [id number]  PCP:  [name], MD  Cardiologist:  Fransico Him, MD   Referring MD: [name], MD   Chief Complaint  Patient presents with  . Coronary Artery Disease  . Hypertension  . Sleep Apnea  . Hyperlipidemia    History of Present Illness:    [name] is a 83 y.o. male with a hx of  ASCAD, HTN, dyslipidemia, OSA on CPAP, chronic diastolic CHF and obesity. He was hospitalized in December with acute bronchitis and sepsis syndrome with secondary acute on chronic diastolic CHF. He was diuresed and is doing much better. He is here today for followup.  He has had some problems with chest discomfort when he gets up in the am that he describes as a sharp pain that lasts a few seconds and he says that it feels that it is gas pain and not like his anginal CP.  His SOB has improved with treatment of his PNA.  He has chronic LE edema that is controlled on diuretics and are much improved today.  He denies any PND or orthopnea, dizziness, palpitations or syncope.   He is doing well with his CPAP device but does not always use it because he says that he sleeps better without the device.  He does not snore when he uses the CPAP.  He does not notice much difference in how he feels in the am if he has used it or not used it.     Past Medical History:  Diagnosis Date  . Anxiety   . Asthma   . Chronic diastolic CHF (congestive heart failure) (HCC)    diastolic   . COPD (chronic obstructive pulmonary disease) ([place])   . Coronary artery disease    s/p PCI of RCA 03/2009 at which time there was 70% ramus and 90% RCA, cath 01/2011 showed patent stents in RCA with moderate prox disesae, aneurysmal left circ and small 90% ramus s/p PCI, patent LAD EF 55%  . Diabetes mellitus   . Diverticulitis Oct. 2013   bleeding in the past and Effient for his CAD was stopped  . DVT (deep venous thrombosis) ([place])    . GERD (gastroesophageal reflux disease)   . Hypercholesterolemia   . Hypertension   . Hypothyroidism   . Obesity (BMI 30-39.9)   . OSA (obstructive sleep apnea)    severe on CPAP  . Peripheral vascular disease ([place]) 11/2006   s/p left stent  . Shortness of breath   . Sleep apnea    severe OSA awaiting CPAP titration    Past Surgical History:  Procedure Laterality Date  . ABDOMINAL AORTAGRAM N/A 04/20/2012   Procedure: ABDOMINAL Maxcine Ham;  Surgeon: Angelia Mould, MD;  Location: [facility] CATH LAB;  Service: Cardiovascular;  Laterality: N/A;  . ABDOMINAL AORTAGRAM N/A 12/29/2012   Procedure: ABDOMINAL Maxcine Ham;  Surgeon: Serafina Mitchell, MD;  Location: [facility] CATH LAB;  Service: Cardiovascular;  Laterality: N/A;  . BALLOON DILATION  12/26/2011   Procedure: BALLOON DILATION;  Surgeon: Garlan Fair, MD;  Location: [facility] ENDOSCOPY;  Service: Endoscopy;  Laterality: N/A;  . COLONOSCOPY  08/14/2012   Procedure: COLONOSCOPY;  Surgeon: Arta Silence, MD;  Location: [facility] ENDOSCOPY;  Service: Endoscopy;  Laterality: N/A;  . CORONARY STENT PLACEMENT  2010 and 2012  . ESOPHAGOGASTRODUODENOSCOPY  12/26/2011   Procedure: ESOPHAGOGASTRODUODENOSCOPY (EGD);  Surgeon: Garlan Fair, MD;  Location:  [facility] ENDOSCOPY;  Service: Endoscopy;  Laterality: N/A;  . ESOPHAGOGASTRODUODENOSCOPY  08/13/2012   Procedure: ESOPHAGOGASTRODUODENOSCOPY (EGD);  Surgeon: Arta Silence, MD;  Location: [facility]OS[facility];  Service: Endoscopy;  Laterality: Left;  . HERNIA REPAIR  1992   evntral hernia repair  . LOWER EXTREMITY ANGIOGRAM Bilateral 12/29/2012   Procedure: LOWER EXTREMITY ANGIOGRAM;  Surgeon: Serafina Mitchell, MD;  Location: [facility] CATH LAB;  Service: Cardiovascular;  Laterality: Bilateral;  bilat lower extrem angio    Current Medications: Current Meds  Medication Sig  . albuterol (PROVENTIL HFA;VENTOLIN HFA) 108 (90 BASE) MCG/ACT inhaler Inhale 2 puffs into the lungs every 6 (six) hours as needed for wheezing or shortness  of breath.   Marland Kitchen albuterol (PROVENTIL) (2.5 MG/3ML) 0.083% nebulizer solution Take 3 mLs (2.5 mg total) by nebulization every 4 (four) hours as needed for wheezing or shortness of breath.  . alfuzosin (UROXATRAL) 10 MG 24 hr tablet Take 10 mg by mouth daily. At 5 pm  . aspirin EC 81 MG tablet Take 81 mg by mouth daily. At 5 pm  . budesonide-formoterol (SYMBICORT) 160-4.5 MCG/ACT inhaler Inhale 2 puffs into the lungs 2 (two) times daily.  Marland Kitchen glimepiride (AMARYL) 2 MG tablet Take 2 mg by mouth daily. At 5 pm  . isosorbide mononitrate (IMDUR) 60 MG 24 hr tablet Take 60 mg by mouth daily. At 5 pm  . levothyroxine (SYNTHROID, LEVOTHROID) 150 MCG tablet Take 150 mcg by mouth daily before breakfast.  . loperamide (IMODIUM) 2 MG capsule Take 2 mg by mouth as needed for diarrhea or loose stools (Take as directed).   . metoprolol succinate (TOPROL-XL) 50 MG 24 hr tablet TAKE 1 TABLET BY MOUTH EVERY DAY  . nitroGLYCERIN (NITROSTAT) 0.4 MG SL tablet Place 0.4 mg under the tongue every 5 (five) minutes as needed for chest pain.  . pantoprazole (PROTONIX) 40 MG tablet Take 40 mg by mouth daily. At 5 pm  . rosuvastatin (CRESTOR) 40 MG tablet Take 1 tablet (40 mg total) by mouth at bedtime. (Patient taking differently: Take 40 mg by mouth daily. At 5 pm)     Allergies:   Clopidogrel   Social History   Social History  . Marital status: Widowed    Spouse name: N/A  . Number of children: N/A  . Years of education: N/A   Social History Main Topics  . Smoking status: Former Smoker    Packs/day: 0.50    Years: 15.00    Types: Cigarettes, Cigars    Quit date: 10/15/1963  . Smokeless tobacco: Never Used  . Alcohol use No  . Drug use: No  . Sexual activity: No   Other Topics Concern  . None   Social History Narrative  . None     Family History: The patient's family history includes Asthma in his sister; Breast cancer in his sister; Diabetes in his brother, mother, and sister; Heart attack in his mother  and sister; Heart disease in his father, mother, and sister; Hyperlipidemia in his mother, sister, and sister; Hypertension in his mother, sister, and sister. There is no history of Malignant hyperthermia.  ROS:   Please see the history of present illness.     All other systems reviewed and are negative.  EKGs/Labs/Other Studies Reviewed:    The following studies were reviewed today: Hospital notes from [facility]  EKG:  EKG is not ordered today.    Recent Labs: 09/24/2016: Magnesium 1.9 12/05/2016: TSH 3.930 05/22/2017: ALT 21; B Natriuretic Peptide 23.3 05/24/2017: BUN  31; Creatinine, Ser 1.30; Hemoglobin 14.8; Platelets 145; Potassium 4.1; Sodium 140   Recent Lipid Panel    Component Value Date/Time   CHOL 131 12/05/2016 0957   TRIG 74 12/05/2016 0957   HDL 39 (L) 12/05/2016 0957   CHOLHDL 3.4 12/05/2016 0957   CHOLHDL 4 11/16/2014 1124   VLDL 19.8 11/16/2014 1124   LDLCALC 77 12/05/2016 0957    Physical Exam:    VS:  BP 126/70   Pulse 86   Ht 5\' 5"  (1.651 m)   Wt 209 lb 3.2 oz (94.9 kg)   BMI 34.81 kg/m     Wt Readings from Last 3 Encounters:  06/30/17 209 lb 3.2 oz (94.9 kg)  05/22/17 206 lb (93.4 kg)  03/11/17 218 lb 3.2 oz (99 kg)     GEN:  Well nourished, well developed in no acute distress HEENT: Normal NECK: No JVD; No carotid bruits LYMPHATICS: No lymphadenopathy CARDIAC: RRR, no murmurs, rubs, gallops RESPIRATORY:  Clear to auscultation without rales, wheezing or rhonchi  ABDOMEN: Soft, non-tender, non-distended MUSCULOSKELETAL:  No edema; No deformity  SKIN: Warm and dry NEUROLOGIC:  Alert and oriented x 3 PSYCHIATRIC:  Normal affect   ASSESSMENT:    1. Coronary artery disease involving native coronary artery of native heart without angina pectoris   2. Chronic diastolic CHF (congestive heart failure) ([place])   3. HYPERTENSION, BENIGN   4. OSA (obstructive sleep apnea)   5. Dyslipidemia    PLAN:    In order of problems listed  above:  1. ***   Medication Adjustments/Labs and Tests Ordered: Current medicines are reviewed at length with the patient today.  Concerns regarding medicines are outlined above.  No orders of the defined types were placed in this encounter.  No orders of the defined types were placed in this encounter.   Signed, Fransico Him, MD  06/30/2017 1:33 PM    [facility]

## 2017-07-01 ENCOUNTER — Telehealth: Payer: Self-pay

## 2017-07-01 DIAGNOSIS — E785 Hyperlipidemia, unspecified: Secondary | ICD-10-CM

## 2017-07-01 LAB — HEPATIC FUNCTION PANEL
ALT: 15 IU/L (ref 0–44)
AST: 25 IU/L (ref 0–40)
Albumin: 4.3 g/dL (ref 3.5–4.7)
Alkaline Phosphatase: 50 IU/L (ref 39–117)
Bilirubin Total: 0.5 mg/dL (ref 0.0–1.2)
Bilirubin, Direct: 0.13 mg/dL (ref 0.00–0.40)
Total Protein: 6.9 g/dL (ref 6.0–8.5)

## 2017-07-01 LAB — LIPID PANEL
CHOL/HDL RATIO: 3.8 ratio (ref 0.0–5.0)
Cholesterol, Total: 189 mg/dL (ref 100–199)
HDL: 50 mg/dL (ref >39)
LDL Calculated: 114 mg/dL — ABNORMAL HIGH (ref 0–99)
Triglycerides: 126 mg/dL (ref 0–149)
VLDL Cholesterol Cal: 25 mg/dL (ref 5–40)

## 2017-07-01 NOTE — Telephone Encounter (Signed)
-----   Message from Sueanne Margarita, MD sent at 07/01/2017  8:12 AM EDT ----- LDL not at goal - please add zetia 10mg  daily and repeat FLP and ALT in 6 weeks

## 2017-07-01 NOTE — Telephone Encounter (Signed)
Informed patient of results and verbal understanding expressed.  Patient reports he has not been taking his Crestor. Instructed patient to RESTART CRESTOR.  He understands to follow low-fat diet. Repeat FLP and ALT scheduled 11/19. Patient agrees with treatment plan.

## 2017-07-07 ENCOUNTER — Telehealth: Payer: Self-pay | Admitting: *Deleted

## 2017-07-07 ENCOUNTER — Other Ambulatory Visit: Payer: Medicare HMO | Admitting: *Deleted

## 2017-07-07 DIAGNOSIS — Z7984 Long term (current) use of oral hypoglycemic drugs: Secondary | ICD-10-CM | POA: Diagnosis not present

## 2017-07-07 DIAGNOSIS — E782 Mixed hyperlipidemia: Secondary | ICD-10-CM | POA: Diagnosis not present

## 2017-07-07 DIAGNOSIS — I2510 Atherosclerotic heart disease of native coronary artery without angina pectoris: Secondary | ICD-10-CM | POA: Diagnosis not present

## 2017-07-07 DIAGNOSIS — I110 Hypertensive heart disease with heart failure: Secondary | ICD-10-CM | POA: Diagnosis not present

## 2017-07-07 DIAGNOSIS — Z0000 Encounter for general adult medical examination without abnormal findings: Secondary | ICD-10-CM | POA: Diagnosis not present

## 2017-07-07 DIAGNOSIS — I5030 Unspecified diastolic (congestive) heart failure: Secondary | ICD-10-CM | POA: Diagnosis not present

## 2017-07-07 DIAGNOSIS — Z23 Encounter for immunization: Secondary | ICD-10-CM | POA: Diagnosis not present

## 2017-07-07 DIAGNOSIS — K5730 Diverticulosis of large intestine without perforation or abscess without bleeding: Secondary | ICD-10-CM | POA: Diagnosis not present

## 2017-07-07 DIAGNOSIS — J449 Chronic obstructive pulmonary disease, unspecified: Secondary | ICD-10-CM | POA: Diagnosis not present

## 2017-07-07 DIAGNOSIS — E1159 Type 2 diabetes mellitus with other circulatory complications: Secondary | ICD-10-CM | POA: Diagnosis not present

## 2017-07-07 DIAGNOSIS — I739 Peripheral vascular disease, unspecified: Secondary | ICD-10-CM | POA: Diagnosis not present

## 2017-07-07 NOTE — Telephone Encounter (Signed)
[facility] sending a download for patient.

## 2017-07-07 NOTE — Telephone Encounter (Signed)
-----   Message from Emmaline Life, RN sent at 06/30/2017  1:53 PM EDT ----- Per Dr. Radford Pax  1. Please get download 2. Check w/ DME on eligibility for new PAP device  Thanks, Sharyn Lull

## 2017-07-09 ENCOUNTER — Encounter: Payer: Self-pay | Admitting: Cardiology

## 2017-07-10 NOTE — Telephone Encounter (Signed)
Reached out to [facility] today to check on patient's download and was informed by Roderic Ovens that the patient did not get his machine from them. Reached out to patient's daughter and was informed that [facility] is patient's DME. Patient's daughter will take the CPAP machine by [facility] to get a download today.  Jonni Sanger [employer] [facility]) says patient is eligible for a new CPAP unit.  Confirmation of download received 2:46 pm. Sent to be scanned.

## 2017-07-18 ENCOUNTER — Telehealth: Payer: Self-pay | Admitting: *Deleted

## 2017-07-18 NOTE — Telephone Encounter (Signed)
Informed patient of compliance results and verbalized understanding was indicated. Patient understands his events are in normal range at 1.4. Patient understands he needs to improve on compliance. Patient was grateful for the call and thanked me.

## 2017-07-18 NOTE — Telephone Encounter (Signed)
-----   Message from Sueanne Margarita, MD sent at 07/17/2017 11:15 PM EDT ----- Good AHI on CPAP but needs to improve compliance

## 2017-07-23 DIAGNOSIS — I5032 Chronic diastolic (congestive) heart failure: Secondary | ICD-10-CM | POA: Diagnosis not present

## 2017-07-26 DIAGNOSIS — J440 Chronic obstructive pulmonary disease with acute lower respiratory infection: Secondary | ICD-10-CM | POA: Diagnosis not present

## 2017-07-26 DIAGNOSIS — G4733 Obstructive sleep apnea (adult) (pediatric): Secondary | ICD-10-CM | POA: Diagnosis not present

## 2017-07-28 ENCOUNTER — Other Ambulatory Visit: Payer: Self-pay | Admitting: Cardiology

## 2017-08-13 DIAGNOSIS — R21 Rash and other nonspecific skin eruption: Secondary | ICD-10-CM | POA: Diagnosis not present

## 2017-08-13 DIAGNOSIS — L309 Dermatitis, unspecified: Secondary | ICD-10-CM | POA: Diagnosis not present

## 2017-08-13 DIAGNOSIS — G4733 Obstructive sleep apnea (adult) (pediatric): Secondary | ICD-10-CM | POA: Diagnosis not present

## 2017-08-22 DIAGNOSIS — D485 Neoplasm of uncertain behavior of skin: Secondary | ICD-10-CM | POA: Diagnosis not present

## 2017-08-22 DIAGNOSIS — L818 Other specified disorders of pigmentation: Secondary | ICD-10-CM | POA: Diagnosis not present

## 2017-08-22 DIAGNOSIS — L814 Other melanin hyperpigmentation: Secondary | ICD-10-CM | POA: Diagnosis not present

## 2017-08-22 DIAGNOSIS — L438 Other lichen planus: Secondary | ICD-10-CM | POA: Diagnosis not present

## 2017-08-23 DIAGNOSIS — I5032 Chronic diastolic (congestive) heart failure: Secondary | ICD-10-CM | POA: Diagnosis not present

## 2017-08-29 DIAGNOSIS — L818 Other specified disorders of pigmentation: Secondary | ICD-10-CM | POA: Diagnosis not present

## 2017-08-29 DIAGNOSIS — L438 Other lichen planus: Secondary | ICD-10-CM | POA: Diagnosis not present

## 2017-09-01 ENCOUNTER — Other Ambulatory Visit: Payer: Medicare HMO

## 2017-09-08 ENCOUNTER — Other Ambulatory Visit: Payer: Medicare HMO | Admitting: *Deleted

## 2017-09-08 ENCOUNTER — Encounter (INDEPENDENT_AMBULATORY_CARE_PROVIDER_SITE_OTHER): Payer: Self-pay

## 2017-09-08 DIAGNOSIS — E785 Hyperlipidemia, unspecified: Secondary | ICD-10-CM

## 2017-09-09 ENCOUNTER — Telehealth: Payer: Self-pay

## 2017-09-09 DIAGNOSIS — E785 Hyperlipidemia, unspecified: Secondary | ICD-10-CM

## 2017-09-09 LAB — LIPID PANEL
CHOLESTEROL TOTAL: 174 mg/dL (ref 100–199)
Chol/HDL Ratio: 3.9 ratio (ref 0.0–5.0)
HDL: 45 mg/dL (ref >39)
LDL CALC: 113 mg/dL — AB (ref 0–99)
TRIGLYCERIDES: 80 mg/dL (ref 0–149)
VLDL Cholesterol Cal: 16 mg/dL (ref 5–40)

## 2017-09-09 LAB — ALT: ALT: 14 IU/L (ref 0–44)

## 2017-09-09 MED ORDER — EZETIMIBE 10 MG PO TABS: 10.0000 mg | ORAL_TABLET | Freq: Every day | ORAL | 3 refills | Status: DC

## 2017-09-09 NOTE — Telephone Encounter (Signed)
Notes recorded by Teressa Senter, RN on 09/09/2017 at 9:51 AM EST Patient made aware of results. Instructed patient to start taking Zetia 10 mg everyday. Patient is scheduled for repeat FLP and ALT on 10/23/17. Patient verbalizes understanding.  Notes recorded by Sueanne Margarita, MD on 09/09/2017 at 8:35 AM EST LDL not at goal - add Zetia 10mg  daily and repeat FLP and ALT I'm 6 weeks

## 2017-09-22 DIAGNOSIS — I5032 Chronic diastolic (congestive) heart failure: Secondary | ICD-10-CM | POA: Diagnosis not present

## 2017-10-20 DIAGNOSIS — E1159 Type 2 diabetes mellitus with other circulatory complications: Secondary | ICD-10-CM | POA: Diagnosis not present

## 2017-10-20 DIAGNOSIS — I739 Peripheral vascular disease, unspecified: Secondary | ICD-10-CM | POA: Diagnosis not present

## 2017-10-20 DIAGNOSIS — I2510 Atherosclerotic heart disease of native coronary artery without angina pectoris: Secondary | ICD-10-CM | POA: Diagnosis not present

## 2017-10-20 DIAGNOSIS — E039 Hypothyroidism, unspecified: Secondary | ICD-10-CM | POA: Diagnosis not present

## 2017-10-20 DIAGNOSIS — J449 Chronic obstructive pulmonary disease, unspecified: Secondary | ICD-10-CM | POA: Diagnosis not present

## 2017-10-20 DIAGNOSIS — Z6837 Body mass index (BMI) 37.0-37.9, adult: Secondary | ICD-10-CM | POA: Diagnosis not present

## 2017-10-20 DIAGNOSIS — I5030 Unspecified diastolic (congestive) heart failure: Secondary | ICD-10-CM | POA: Diagnosis not present

## 2017-10-20 DIAGNOSIS — E782 Mixed hyperlipidemia: Secondary | ICD-10-CM | POA: Diagnosis not present

## 2017-10-20 DIAGNOSIS — I110 Hypertensive heart disease with heart failure: Secondary | ICD-10-CM | POA: Diagnosis not present

## 2017-10-23 ENCOUNTER — Other Ambulatory Visit: Payer: Medicare HMO | Admitting: *Deleted

## 2017-10-23 DIAGNOSIS — E785 Hyperlipidemia, unspecified: Secondary | ICD-10-CM

## 2017-10-23 DIAGNOSIS — I5032 Chronic diastolic (congestive) heart failure: Secondary | ICD-10-CM | POA: Diagnosis not present

## 2017-10-23 LAB — LIPID PANEL
CHOL/HDL RATIO: 2.4 ratio (ref 0.0–5.0)
Cholesterol, Total: 101 mg/dL (ref 100–199)
HDL: 42 mg/dL (ref >39)
LDL Calculated: 47 mg/dL (ref 0–99)
TRIGLYCERIDES: 58 mg/dL (ref 0–149)
VLDL Cholesterol Cal: 12 mg/dL (ref 5–40)

## 2017-10-23 LAB — ALT: ALT: 21 IU/L (ref 0–44)

## 2017-11-13 ENCOUNTER — Emergency Department (HOSPITAL_COMMUNITY): Payer: Medicare Other

## 2017-11-13 ENCOUNTER — Other Ambulatory Visit: Payer: Self-pay

## 2017-11-13 ENCOUNTER — Inpatient Hospital Stay (HOSPITAL_COMMUNITY)
Admission: EM | Admit: 2017-11-13 | Discharge: 2017-11-14 | DRG: 190 | Disposition: A | Discharge status: Home / Self Care | Payer: Medicare Other | Attending: Internal Medicine | Admitting: Internal Medicine

## 2017-11-13 ENCOUNTER — Encounter (HOSPITAL_COMMUNITY): Payer: Self-pay | Admitting: Nurse Practitioner

## 2017-11-13 DIAGNOSIS — Z86718 Personal history of other venous thrombosis and embolism: Secondary | ICD-10-CM

## 2017-11-13 DIAGNOSIS — Z87891 Personal history of nicotine dependence: Secondary | ICD-10-CM

## 2017-11-13 DIAGNOSIS — Z8349 Family history of other endocrine, nutritional and metabolic diseases: Secondary | ICD-10-CM

## 2017-11-13 DIAGNOSIS — Z7984 Long term (current) use of oral hypoglycemic drugs: Secondary | ICD-10-CM

## 2017-11-13 DIAGNOSIS — R5383 Other fatigue: Secondary | ICD-10-CM | POA: Diagnosis not present

## 2017-11-13 DIAGNOSIS — E1122 Type 2 diabetes mellitus with diabetic chronic kidney disease: Secondary | ICD-10-CM | POA: Diagnosis present

## 2017-11-13 DIAGNOSIS — Z791 Long term (current) use of non-steroidal anti-inflammatories (NSAID): Secondary | ICD-10-CM | POA: Diagnosis not present

## 2017-11-13 DIAGNOSIS — J449 Chronic obstructive pulmonary disease, unspecified: Secondary | ICD-10-CM | POA: Diagnosis present

## 2017-11-13 DIAGNOSIS — J441 Chronic obstructive pulmonary disease with (acute) exacerbation: Secondary | ICD-10-CM | POA: Diagnosis present

## 2017-11-13 DIAGNOSIS — Z955 Presence of coronary angioplasty implant and graft: Secondary | ICD-10-CM

## 2017-11-13 DIAGNOSIS — K219 Gastro-esophageal reflux disease without esophagitis: Secondary | ICD-10-CM | POA: Diagnosis present

## 2017-11-13 DIAGNOSIS — Z6830 Body mass index (BMI) 30.0-30.9, adult: Secondary | ICD-10-CM

## 2017-11-13 DIAGNOSIS — Z888 Allergy status to other drugs, medicaments and biological substances status: Secondary | ICD-10-CM

## 2017-11-13 DIAGNOSIS — E039 Hypothyroidism, unspecified: Secondary | ICD-10-CM | POA: Diagnosis present

## 2017-11-13 DIAGNOSIS — Z803 Family history of malignant neoplasm of breast: Secondary | ICD-10-CM

## 2017-11-13 DIAGNOSIS — J111 Influenza due to unidentified influenza virus with other respiratory manifestations: Secondary | ICD-10-CM | POA: Diagnosis not present

## 2017-11-13 DIAGNOSIS — J9621 Acute and chronic respiratory failure with hypoxia: Secondary | ICD-10-CM | POA: Diagnosis not present

## 2017-11-13 DIAGNOSIS — R0902 Hypoxemia: Secondary | ICD-10-CM | POA: Diagnosis not present

## 2017-11-13 DIAGNOSIS — I5033 Acute on chronic diastolic (congestive) heart failure: Secondary | ICD-10-CM | POA: Diagnosis present

## 2017-11-13 DIAGNOSIS — Z9889 Other specified postprocedural states: Secondary | ICD-10-CM

## 2017-11-13 DIAGNOSIS — Z825 Family history of asthma and other chronic lower respiratory diseases: Secondary | ICD-10-CM

## 2017-11-13 DIAGNOSIS — E1151 Type 2 diabetes mellitus with diabetic peripheral angiopathy without gangrene: Secondary | ICD-10-CM | POA: Diagnosis present

## 2017-11-13 DIAGNOSIS — E7800 Pure hypercholesterolemia, unspecified: Secondary | ICD-10-CM | POA: Diagnosis present

## 2017-11-13 DIAGNOSIS — Z79890 Hormone replacement therapy (postmenopausal): Secondary | ICD-10-CM | POA: Diagnosis not present

## 2017-11-13 DIAGNOSIS — Z8249 Family history of ischemic heart disease and other diseases of the circulatory system: Secondary | ICD-10-CM

## 2017-11-13 DIAGNOSIS — R069 Unspecified abnormalities of breathing: Secondary | ICD-10-CM | POA: Diagnosis not present

## 2017-11-13 DIAGNOSIS — G4733 Obstructive sleep apnea (adult) (pediatric): Secondary | ICD-10-CM | POA: Diagnosis present

## 2017-11-13 DIAGNOSIS — E785 Hyperlipidemia, unspecified: Secondary | ICD-10-CM | POA: Diagnosis present

## 2017-11-13 DIAGNOSIS — Z833 Family history of diabetes mellitus: Secondary | ICD-10-CM

## 2017-11-13 DIAGNOSIS — Z95820 Peripheral vascular angioplasty status with implants and grafts: Secondary | ICD-10-CM

## 2017-11-13 DIAGNOSIS — J101 Influenza due to other identified influenza virus with other respiratory manifestations: Secondary | ICD-10-CM | POA: Diagnosis present

## 2017-11-13 DIAGNOSIS — N183 Chronic kidney disease, stage 3 (moderate): Secondary | ICD-10-CM | POA: Diagnosis present

## 2017-11-13 DIAGNOSIS — I10 Essential (primary) hypertension: Secondary | ICD-10-CM | POA: Diagnosis present

## 2017-11-13 DIAGNOSIS — R000 Tachycardia, unspecified: Secondary | ICD-10-CM | POA: Diagnosis not present

## 2017-11-13 DIAGNOSIS — I130 Hypertensive heart and chronic kidney disease with heart failure and stage 1 through stage 4 chronic kidney disease, or unspecified chronic kidney disease: Secondary | ICD-10-CM | POA: Diagnosis not present

## 2017-11-13 DIAGNOSIS — I2510 Atherosclerotic heart disease of native coronary artery without angina pectoris: Secondary | ICD-10-CM | POA: Diagnosis not present

## 2017-11-13 DIAGNOSIS — Z9861 Coronary angioplasty status: Secondary | ICD-10-CM

## 2017-11-13 DIAGNOSIS — R0602 Shortness of breath: Secondary | ICD-10-CM | POA: Diagnosis not present

## 2017-11-13 DIAGNOSIS — I5032 Chronic diastolic (congestive) heart failure: Secondary | ICD-10-CM | POA: Diagnosis present

## 2017-11-13 DIAGNOSIS — E669 Obesity, unspecified: Secondary | ICD-10-CM | POA: Diagnosis present

## 2017-11-13 LAB — INFLUENZA PANEL BY PCR (TYPE A & B)
Influenza A By PCR: POSITIVE — AB
Influenza B By PCR: NEGATIVE

## 2017-11-13 LAB — URINALYSIS, ROUTINE W REFLEX MICROSCOPIC
Bacteria, UA: NONE SEEN
Bilirubin Urine: NEGATIVE
GLUCOSE, UA: NEGATIVE mg/dL
KETONES UR: 5 mg/dL — AB
Leukocytes, UA: NEGATIVE
Nitrite: NEGATIVE
PROTEIN: 30 mg/dL — AB
SQUAMOUS EPITHELIAL / LPF: NONE SEEN
Specific Gravity, Urine: 1.013 (ref 1.005–1.030)
pH: 5 (ref 5.0–8.0)

## 2017-11-13 LAB — I-STAT TROPONIN, ED: Troponin i, poc: 0 ng/mL (ref 0.00–0.08)

## 2017-11-13 LAB — COMPREHENSIVE METABOLIC PANEL
ALBUMIN: 4.4 g/dL (ref 3.5–5.0)
ALT: 51 U/L (ref 17–63)
AST: 89 U/L — AB (ref 15–41)
Alkaline Phosphatase: 42 U/L (ref 38–126)
Anion gap: 12 (ref 5–15)
BUN: 20 mg/dL (ref 6–20)
CHLORIDE: 93 mmol/L — AB (ref 101–111)
CO2: 27 mmol/L (ref 22–32)
Calcium: 9.4 mg/dL (ref 8.9–10.3)
Creatinine, Ser: 1.32 mg/dL — ABNORMAL HIGH (ref 0.61–1.24)
GFR calc Af Amer: 55 mL/min — ABNORMAL LOW (ref >60)
GFR calc non Af Amer: 47 mL/min — ABNORMAL LOW (ref >60)
GLUCOSE: 127 mg/dL — AB (ref 65–99)
POTASSIUM: 3.8 mmol/L (ref 3.5–5.1)
SODIUM: 132 mmol/L — AB (ref 135–145)
Total Bilirubin: 0.8 mg/dL (ref 0.3–1.2)
Total Protein: 7.7 g/dL (ref 6.5–8.1)

## 2017-11-13 LAB — CBC WITH DIFFERENTIAL/PLATELET
BASOS ABS: 0 10*3/uL (ref 0.0–0.1)
BASOS PCT: 0 %
EOS PCT: 0 %
Eosinophils Absolute: 0 10*3/uL (ref 0.0–0.7)
HCT: 41.7 % (ref 39.0–52.0)
Hemoglobin: 14.2 g/dL (ref 13.0–17.0)
Lymphocytes Relative: 9 %
Lymphs Abs: 0.7 10*3/uL (ref 0.7–4.0)
MCH: 30.1 pg (ref 26.0–34.0)
MCHC: 34.1 g/dL (ref 30.0–36.0)
MCV: 88.5 fL (ref 78.0–100.0)
MONO ABS: 1.3 10*3/uL — AB (ref 0.1–1.0)
Monocytes Relative: 16 %
Neutro Abs: 6.1 10*3/uL (ref 1.7–7.7)
Neutrophils Relative %: 75 %
PLATELETS: 122 10*3/uL — AB (ref 150–400)
RBC: 4.71 MIL/uL (ref 4.22–5.81)
RDW: 13.6 % (ref 11.5–15.5)
WBC: 8.1 10*3/uL (ref 4.0–10.5)

## 2017-11-13 LAB — I-STAT CG4 LACTIC ACID, ED: Lactic Acid, Venous: 1.75 mmol/L (ref 0.5–1.9)

## 2017-11-13 LAB — GLUCOSE, CAPILLARY: Glucose-Capillary: 240 mg/dL — ABNORMAL HIGH (ref 65–99)

## 2017-11-13 LAB — BRAIN NATRIURETIC PEPTIDE: B NATRIURETIC PEPTIDE 5: 88.2 pg/mL (ref 0.0–100.0)

## 2017-11-13 MED ORDER — SODIUM CHLORIDE 0.9 % IV SOLN: 250.0000 mL | INTRAVENOUS | Status: DC | PRN

## 2017-11-13 MED ORDER — POLYETHYLENE GLYCOL 3350 17 G PO PACK: 17.0000 g | PACK | Freq: Every day | ORAL | Status: DC | PRN

## 2017-11-13 MED ORDER — ISOSORBIDE MONONITRATE ER 60 MG PO TB24
60.0000 mg | ORAL_TABLET | Freq: Every day | ORAL | Status: DC
  Administered 2017-11-13: 60 mg via ORAL
  Filled 2017-11-13: qty 1

## 2017-11-13 MED ORDER — INSULIN ASPART 100 UNIT/ML ~~LOC~~ SOLN
0.0000 [IU] | Freq: Three times a day (TID) | SUBCUTANEOUS | Status: DC
  Administered 2017-11-14: 3 [IU] via SUBCUTANEOUS
  Administered 2017-11-14: 8 [IU] via SUBCUTANEOUS

## 2017-11-13 MED ORDER — OXYMETAZOLINE HCL 0.05 % NA SOLN
1.0000 | Freq: Two times a day (BID) | NASAL | Status: DC
  Filled 2017-11-13: qty 15

## 2017-11-13 MED ORDER — NITROGLYCERIN 0.4 MG SL SUBL: 0.4000 mg | SUBLINGUAL_TABLET | SUBLINGUAL | Status: DC | PRN

## 2017-11-13 MED ORDER — ACETAMINOPHEN 650 MG RE SUPP: 650.0000 mg | Freq: Four times a day (QID) | RECTAL | Status: DC | PRN

## 2017-11-13 MED ORDER — OSELTAMIVIR PHOSPHATE 30 MG PO CAPS
30.0000 mg | ORAL_CAPSULE | Freq: Two times a day (BID) | ORAL | Status: DC
  Administered 2017-11-13 – 2017-11-14 (×2): 30 mg via ORAL
  Filled 2017-11-13 (×2): qty 1

## 2017-11-13 MED ORDER — FLUTICASONE PROPIONATE 50 MCG/ACT NA SUSP
2.0000 | Freq: Every day | NASAL | Status: DC
  Filled 2017-11-13: qty 16

## 2017-11-13 MED ORDER — LEVOTHYROXINE SODIUM 75 MCG PO TABS
150.0000 ug | ORAL_TABLET | Freq: Every day | ORAL | Status: DC
  Administered 2017-11-14: 150 ug via ORAL
  Filled 2017-11-13: qty 2

## 2017-11-13 MED ORDER — IPRATROPIUM-ALBUTEROL 0.5-2.5 (3) MG/3ML IN SOLN
3.0000 mL | Freq: Four times a day (QID) | RESPIRATORY_TRACT | Status: DC
  Administered 2017-11-14: 3 mL via RESPIRATORY_TRACT
  Filled 2017-11-13: qty 3

## 2017-11-13 MED ORDER — METHYLPREDNISOLONE SODIUM SUCC 125 MG IJ SOLR
125.0000 mg | Freq: Once | INTRAMUSCULAR | Status: AC
  Administered 2017-11-13: 125 mg via INTRAVENOUS
  Filled 2017-11-13: qty 2

## 2017-11-13 MED ORDER — ALBUTEROL SULFATE (2.5 MG/3ML) 0.083% IN NEBU: 2.5000 mg | INHALATION_SOLUTION | RESPIRATORY_TRACT | Status: DC | PRN

## 2017-11-13 MED ORDER — ASPIRIN EC 81 MG PO TBEC
81.0000 mg | DELAYED_RELEASE_TABLET | Freq: Every day | ORAL | Status: DC
  Administered 2017-11-13: 81 mg via ORAL
  Filled 2017-11-13: qty 1

## 2017-11-13 MED ORDER — GUAIFENESIN ER 600 MG PO TB12
600.0000 mg | ORAL_TABLET | Freq: Two times a day (BID) | ORAL | Status: DC
  Administered 2017-11-13 – 2017-11-14 (×2): 600 mg via ORAL
  Filled 2017-11-13 (×2): qty 1

## 2017-11-13 MED ORDER — GLIMEPIRIDE 2 MG PO TABS
2.0000 mg | ORAL_TABLET | Freq: Every day | ORAL | Status: DC
  Administered 2017-11-13: 2 mg via ORAL
  Filled 2017-11-13 (×2): qty 1

## 2017-11-13 MED ORDER — ALFUZOSIN HCL ER 10 MG PO TB24
10.0000 mg | ORAL_TABLET | Freq: Every day | ORAL | Status: DC
  Administered 2017-11-14: 10 mg via ORAL
  Filled 2017-11-13: qty 1

## 2017-11-13 MED ORDER — ENOXAPARIN SODIUM 40 MG/0.4ML ~~LOC~~ SOLN
40.0000 mg | Freq: Every day | SUBCUTANEOUS | Status: DC
  Administered 2017-11-13: 40 mg via SUBCUTANEOUS
  Filled 2017-11-13: qty 0.4

## 2017-11-13 MED ORDER — ONDANSETRON HCL 4 MG/2ML IJ SOLN: 4.0000 mg | Freq: Four times a day (QID) | INTRAMUSCULAR | Status: DC | PRN

## 2017-11-13 MED ORDER — FUROSEMIDE 10 MG/ML IJ SOLN
40.0000 mg | Freq: Two times a day (BID) | INTRAMUSCULAR | Status: DC
  Administered 2017-11-14: 40 mg via INTRAVENOUS
  Filled 2017-11-13: qty 4

## 2017-11-13 MED ORDER — SODIUM CHLORIDE 0.9% FLUSH
3.0000 mL | Freq: Two times a day (BID) | INTRAVENOUS | Status: DC
  Administered 2017-11-13 – 2017-11-14 (×2): 3 mL via INTRAVENOUS

## 2017-11-13 MED ORDER — ALBUTEROL SULFATE (2.5 MG/3ML) 0.083% IN NEBU
2.5000 mg | INHALATION_SOLUTION | Freq: Once | RESPIRATORY_TRACT | Status: AC
  Administered 2017-11-13: 2.5 mg via RESPIRATORY_TRACT
  Filled 2017-11-13: qty 3

## 2017-11-13 MED ORDER — SODIUM CHLORIDE 0.9 % IV BOLUS (SEPSIS)
1000.0000 mL | Freq: Once | INTRAVENOUS | Status: AC
  Administered 2017-11-13: 1000 mL via INTRAVENOUS

## 2017-11-13 MED ORDER — SODIUM CHLORIDE 0.9% FLUSH: 3.0000 mL | INTRAVENOUS | Status: DC | PRN

## 2017-11-13 MED ORDER — ACETAMINOPHEN 500 MG PO TABS
1000.0000 mg | ORAL_TABLET | Freq: Once | ORAL | Status: AC
  Administered 2017-11-13: 1000 mg via ORAL
  Filled 2017-11-13: qty 2

## 2017-11-13 MED ORDER — ROSUVASTATIN CALCIUM 20 MG PO TABS
40.0000 mg | ORAL_TABLET | Freq: Every day | ORAL | Status: DC
  Administered 2017-11-13: 40 mg via ORAL
  Filled 2017-11-13: qty 2

## 2017-11-13 MED ORDER — LORATADINE 10 MG PO TABS
10.0000 mg | ORAL_TABLET | Freq: Every day | ORAL | Status: DC
  Administered 2017-11-13 – 2017-11-14 (×2): 10 mg via ORAL
  Filled 2017-11-13 (×2): qty 1

## 2017-11-13 MED ORDER — CLOBETASOL PROPIONATE 0.05 % EX CREA
TOPICAL_CREAM | Freq: Two times a day (BID) | CUTANEOUS | Status: DC | PRN
  Filled 2017-11-13: qty 15

## 2017-11-13 MED ORDER — METOPROLOL SUCCINATE ER 50 MG PO TB24
50.0000 mg | ORAL_TABLET | Freq: Every day | ORAL | Status: DC
  Administered 2017-11-13: 50 mg via ORAL
  Filled 2017-11-13: qty 1

## 2017-11-13 MED ORDER — CEFTRIAXONE SODIUM 1 G IJ SOLR
1.0000 g | Freq: Once | INTRAMUSCULAR | Status: AC
  Administered 2017-11-13: 1 g via INTRAVENOUS
  Filled 2017-11-13: qty 10

## 2017-11-13 MED ORDER — PANTOPRAZOLE SODIUM 40 MG PO TBEC
40.0000 mg | DELAYED_RELEASE_TABLET | Freq: Every day | ORAL | Status: DC
  Administered 2017-11-14: 40 mg via ORAL
  Filled 2017-11-13: qty 1

## 2017-11-13 MED ORDER — EZETIMIBE 10 MG PO TABS
10.0000 mg | ORAL_TABLET | Freq: Every day | ORAL | Status: DC
  Administered 2017-11-14: 10 mg via ORAL
  Filled 2017-11-13: qty 1

## 2017-11-13 MED ORDER — DEXTROSE 5 % IV SOLN
500.0000 mg | Freq: Once | INTRAVENOUS | Status: AC
  Administered 2017-11-13: 500 mg via INTRAVENOUS
  Filled 2017-11-13: qty 500

## 2017-11-13 MED ORDER — ACETAMINOPHEN 325 MG PO TABS: 650.0000 mg | ORAL_TABLET | Freq: Four times a day (QID) | ORAL | Status: DC | PRN

## 2017-11-13 MED ORDER — METHYLPREDNISOLONE SODIUM SUCC 125 MG IJ SOLR
60.0000 mg | Freq: Three times a day (TID) | INTRAMUSCULAR | Status: DC
  Administered 2017-11-13 – 2017-11-14 (×3): 60 mg via INTRAVENOUS
  Filled 2017-11-13 (×3): qty 2

## 2017-11-13 MED ORDER — ONDANSETRON HCL 4 MG PO TABS
4.0000 mg | ORAL_TABLET | Freq: Four times a day (QID) | ORAL | Status: DC | PRN
Start: 2017-11-13 — End: 2017-11-14

## 2017-11-13 MED ORDER — IPRATROPIUM-ALBUTEROL 0.5-2.5 (3) MG/3ML IN SOLN
3.0000 mL | Freq: Once | RESPIRATORY_TRACT | Status: AC
  Administered 2017-11-13: 3 mL via RESPIRATORY_TRACT
  Filled 2017-11-13: qty 3

## 2017-11-13 NOTE — ED Provider Notes (Signed)
[facility] Provider Note   CSN: [id number] Arrival date & time: 11/13/17  1250     History   Chief Complaint Chief Complaint  Patient presents with  . Shortness of Breath    HPI [name] is a 84 y.o. male with PMH/ CHF, COPD, CAD, DM who presents for evaluation of 2 days of progressively worsening shortness of breath.  Patient reports that he has a history of COPD and has been using his albuterol inhalers more frequently over the last 2 days.  He reports a small cough that is nonproductive.  Patient reports that he came to the emergency department today because he was having more difficulty breathing as well as generalized fatigue. He reports some chest soreness but denies any pain.  Patient is currently on 2 L of O2 during the night at baseline.  He has not had to increase his amount of oxygen.  Patient does sleep with some pillows to prop him up but states that he has not had to increase that since symptoms began.  Patient denies any fevers, abdominal pain, nausea/vomiting, increased swelling in his legs.  The history is provided by the patient.    Past Medical History:  Diagnosis Date  . Anxiety   . Asthma   . Chronic diastolic CHF (congestive heart failure) (HCC)    diastolic   . COPD (chronic obstructive pulmonary disease) ([place])   . Coronary artery disease    s/p PCI of RCA 03/2009 at which time there was 70% ramus and 90% RCA, cath 01/2011 showed patent stents in RCA with moderate prox disesae, aneurysmal left circ and small 90% ramus s/p PCI, patent LAD EF 55%  . Diabetes mellitus   . Diverticulitis Oct. 2013   bleeding in the past and Effient for his CAD was stopped  . DVT (deep venous thrombosis) ([place])   . GERD (gastroesophageal reflux disease)   . Hypercholesterolemia   . Hypertension   . Hypothyroidism   . Obesity (BMI 30-39.9)   . OSA (obstructive sleep apnea)    severe on CPAP  . Peripheral vascular disease ([place])  11/2006   s/p left stent  . Shortness of breath   . Sleep apnea    severe OSA awaiting CPAP titration    Patient Active Problem List   Diagnosis Date Noted  . Influenza A 11/13/2017  . Acute kidney injury ([place])   . LLL pneumonia ([place]) 05/22/2017  . History of DVT of lower extremity 05/22/2017  . COPD exacerbation ([place]) 09/22/2016  . SIRS (systemic inflammatory response syndrome) ([place]) 09/22/2016  . Dyspnea 11/16/2014  . Hypoxia 11/16/2014  . Aftercare following surgery of the circulatory system 08/03/2014  . Chronic diastolic CHF (congestive heart failure) ([place]) 06/21/2014  . Edema extremities 06/21/2014  . COPD GOLD III 12/27/2013  . Coronary artery disease   . OSA (obstructive sleep apnea)   . Obesity (BMI 30-39.9)   . Weakness of left leg 11/20/2012  . Left thigh pain 11/20/2012  . Atherosclerosis of native arteries of the extremities with intermittent claudication 04/01/2012  . WEIGHT GAIN, ABNORMAL 12/07/2009  . Dyslipidemia 11/21/2009  . HYPERTENSION, BENIGN 11/21/2009  . DEEP VENOUS THROMBOPHLEBITIS, LEFT, LEG, HX OF 11/21/2009    Past Surgical History:  Procedure Laterality Date  . ABDOMINAL AORTAGRAM N/A 04/20/2012   Procedure: ABDOMINAL Maxcine Ham;  Surgeon: Angelia Mould, MD;  Location: [facility] CATH LAB;  Service: Cardiovascular;  Laterality: N/A;  . ABDOMINAL AORTAGRAM N/A 12/29/2012  Procedure: ABDOMINAL AORTAGRAM;  Surgeon: Serafina Mitchell, MD;  Location[facility] C[facility] LAB;  Service: Cardiovascular;  Laterality: N/A;  . BALLOON DILATION  12/26/2011   Procedure: BALLOON DILATION;  Surgeon: Garlan Fair, MD;  Location: [facility] ENDOSCOPY;  Service: Endoscopy;  Laterality: N/A;  . COLONOSCOPY  08/14/2012   Procedure: COLONOSCOPY;  Surgeon: Arta Silence, MD;  Location: [facility] ENDOSCOPY;  Service: Endoscopy;  Laterality: N/A;  . CORONARY STENT PLACEMENT  2010 and 2012  . ESOPHAGOGASTRODUODENOSCOPY  12/26/2011   Procedure: ESOPHAGOGASTRODUODENOSCOPY (EGD);  Surgeon: Garlan Fair, MD;  Location: [facility] END[facility];  Service: Endoscopy;  Laterality: N/A;  . ESOPHAGOGASTRODUODENOSCOPY  08/13/2012   Procedure: ESOPHAGOGASTRODUODENOSCOPY (EGD);  Surgeon: Arta Silence, MD;  Location: [facility] ENDOSCOPY;  Service: Endoscopy;  Laterality: Left;  . HERNIA REPAIR  1992   evntral hernia repair  . LOWER EXTREMITY ANGIOGRAM Bilateral 12/29/2012   Procedure: LOWER EXTREMITY ANGIOGRAM;  Surgeon: Serafina Mitchell, MD;  Location: [facility] CATH LAB;  Service: Cardiovascular;  Laterality: Bilateral;  bilat lower extrem angio       Home Medications    Prior to Admission medications   Medication Sig Start Date End Date Taking? Authorizing Provider  albuterol (PROVENTIL HFA;VENTOLIN HFA) 108 (90 BASE) MCG/ACT inhaler Inhale 2 puffs into the lungs every 6 (six) hours as needed for wheezing or shortness of breath.    Yes [provider]  albuterol (PROVENTIL) (2.5 MG/3ML) 0.083% nebulizer solution Take 3 mLs (2.5 mg total) by nebulization every 4 (four) hours as needed for wheezing or shortness of breath. 09/25/16  Yes Patrecia Pour, MD  alfuzosin (UROXATRAL) 10 MG 24 hr tablet Take 10 mg by mouth daily. At 5 pm   Yes [provider]  aspirin EC 81 MG tablet Take 81 mg by mouth daily. At 5 pm   Yes [provider]  budesonide-formoterol (SYMBICORT) 160-4.5 MCG/ACT inhaler Inhale 2 puffs into the lungs 2 (two) times daily.   Yes [provider]  clobetasol cream (TEMOVATE) 0.05 % APPLY TO AFFECTED AREA OF [place]/LEGS UP TO 2 TIMES DAILY AS NEEDED *NOT TO FACE,GROIN,OR UNDERARMS 09/03/17  Yes [provider]  ezetimibe (ZETIA) 10 MG tablet Take 1 tablet (10 mg total) by mouth daily. 09/09/17 12/08/17 Yes Turner, Eber Hong, MD  furosemide (LASIX) 20 MG tablet Take 1 tablet (20 mg total) by mouth daily. 06/30/17 11/13/17 Yes Turner, Eber Hong, MD  glimepiride (AMARYL) 2 MG tablet Take 2 mg by mouth daily. At 5 pm   Yes [provider]  isosorbide mononitrate  (IMDUR) 60 MG 24 hr tablet Take 60 mg by mouth daily. At 5 pm   Yes [provider]  levothyroxine (SYNTHROID, LEVOTHROID) 150 MCG tablet Take 150 mcg by mouth daily before breakfast. 05/19/17  Yes [provider]  loperamide (IMODIUM) 2 MG capsule Take 2 mg by mouth as needed for diarrhea or loose stools (Take as directed).    Yes [provider]  meloxicam (MOBIC) 15 MG tablet Take 15 mg by mouth daily. 10/22/17  Yes [provider]  metoprolol succinate (TOPROL-XL) 50 MG 24 hr tablet TAKE 1 TABLET BY MOUTH EVERY DAY 02/11/17  Yes Turner, Traci R, MD  pantoprazole (PROTONIX) 40 MG tablet TAKE 1 TABLET (40 MG TOTAL) BY MOUTH 2 (TWO) TIMES DAILY. 07/29/17  Yes Turner, Eber Hong, MD  rosuvastatin (CRESTOR) 40 MG tablet Take 1 tablet (40 mg total) by mouth at bedtime. Patient taking differently: Take 40 mg by mouth daily. At  5 pm 12/06/16 12/01/17 Yes Turner, Traci R, MD  triamcinolone cream (KENALOG) 0.1 % APPLY TO AFFECTED AREA UP TO TWICE A DAY AS NEEDED NOT TO FACE/GROIN/UNDERARMS 08/29/17  Yes [provider]  nitroGLYCERIN (NITROSTAT) 0.4 MG SL tablet Place 0.4 mg under the tongue every 5 (five) minutes as needed for chest pain.    [provider]    Family History Family History  Problem Relation Age of Onset  . Diabetes Mother   . Hyperlipidemia Mother   . Hypertension Mother   . Heart attack Mother   . Heart disease Mother        before age 36  . Diabetes Brother   . Heart disease Father        before age 53  . Breast cancer Sister   . Diabetes Sister   . Hyperlipidemia Sister   . Hypertension Sister   . Heart attack Sister   . Hyperlipidemia Sister   . Hypertension Sister   . Heart disease Sister        Heart Disease before age 85  . Asthma Sister   . Malignant hyperthermia Neg Hx     Social History Social History   Tobacco Use  . Smoking status: Former Smoker    Packs/day: 0.50    Years: 15.00    Pack years: 7.50     Types: Cigarettes, Cigars    Last attempt to quit: 10/15/1963    Years since quitting: 54.1  . Smokeless tobacco: Never Used  Substance Use Topics  . Alcohol use: No  . Drug use: No     Allergies   Clopidogrel   Review of Systems Review of Systems  Constitutional: Positive for fatigue. Negative for chills and fever.  HENT: Negative for congestion.   Eyes: Negative for visual disturbance.  Respiratory: Positive for cough and shortness of breath.   Cardiovascular: Negative for chest pain.  Gastrointestinal: Negative for abdominal pain, diarrhea, nausea and vomiting.  Genitourinary: Negative for dysuria and hematuria.  Musculoskeletal: Negative for back pain.  Skin: Negative for rash.  Neurological: Negative for dizziness, weakness, numbness and headaches.  All other systems reviewed and are negative.    Physical Exam Updated Vital Signs BP (!) 142/80 (BP Location: Left Arm)   Pulse 98   Temp 98.4 F (36.9 C) (Oral)   Resp 20   Ht 5\' 4"  (1.626 m)   Wt 91.5 kg (201 lb 11.5 oz)   SpO2 100%   BMI 34.63 kg/m   Physical Exam  Constitutional: He is oriented to person, place, and time. He appears well-developed and well-nourished.  Appears uncomfortable  HENT:  Head: Normocephalic and atraumatic.  Mouth/Throat: Oropharynx is clear and moist and mucous membranes are normal.  Eyes: Conjunctivae, EOM and lids are normal. Pupils are equal, round, and reactive to light.  Neck: Full passive range of motion without pain.  Cardiovascular: Regular rhythm, normal heart sounds and normal pulses. Tachycardia present. Exam reveals no gallop and no friction rub.  No murmur heard. Pulmonary/Chest: Effort normal. He has decreased breath sounds. He has wheezes.  Speaking in very short sentences. Auditory wheezing noted.   Abdominal: Soft. Normal appearance. There is no tenderness. There is no rigidity and no guarding.  Musculoskeletal: Normal range of motion.  BLE are symmetric in  appearance.   Neurological: He is alert and oriented to person, place, and time.  Skin: Skin is warm and dry. Capillary refill takes less than 2 seconds.  Psychiatric: He has a  normal mood and affect. His speech is normal.  Nursing note and vitals reviewed.    ED Treatments / Results  Labs (all labs ordered are listed, but only abnormal results are displayed) Labs Reviewed  CBC WITH DIFFERENTIAL/PLATELET - Abnormal; Notable for the following components:      Result Value   Platelets 122 (*)    Monocytes Absolute 1.3 (*)    All other components within normal limits  COMPREHENSIVE METABOLIC PANEL - Abnormal; Notable for the following components:   Sodium 132 (*)    Chloride 93 (*)    Glucose, Bld 127 (*)    Creatinine, Ser 1.32 (*)    AST 89 (*)    GFR calc non Af Amer 47 (*)    GFR calc Af Amer 55 (*)    All other components within normal limits  URINALYSIS, ROUTINE W REFLEX MICROSCOPIC - Abnormal; Notable for the following components:   Hgb urine dipstick SMALL (*)    Ketones, ur 5 (*)    Protein, ur 30 (*)    All other components within normal limits  INFLUENZA PANEL BY PCR (TYPE A & B) - Abnormal; Notable for the following components:   Influenza A By PCR POSITIVE (*)    All other components within normal limits  GLUCOSE, CAPILLARY - Abnormal; Notable for the following components:   Glucose-Capillary 240 (*)    All other components within normal limits  CULTURE, BLOOD (ROUTINE X 2)  CULTURE, BLOOD (ROUTINE X 2)  BRAIN NATRIURETIC PEPTIDE  BASIC METABOLIC PANEL  CBC  I-STAT TROPONIN, ED  I-STAT CG4 LACTIC ACID, ED    EKG  EKG Interpretation  Date/Time:  Thursday November 13 2017 13:47:35 EST Ventricular Rate:  116 PR Interval:    QRS Duration: 84 QT Interval:  313 QTC Calculation: 433 R Axis:     Text Interpretation:  Sinus tachycardia No significant change since last tracing Confirmed by Orlie Dakin [phone]) on 11/13/2017 2:00:30 PM       Radiology Dg  Chest 2 View  Result Date: 11/13/2017 CLINICAL DATA:  SOB started Tuesday 11/11/17 and worsened over the last 2 days; diabetic; quit 50 yrs ago; Hx COPD; CHF; Asthma EXAM: CHEST  2 VIEW COMPARISON:  05/22/2017 FINDINGS: Left hilar fullness has been present on a chronic basis and appears to be primarily due to vascular structures on the CT scan from 11/17/2014. Calcified granuloma in the left mid lung, chronically stable. Atherosclerotic calcification of the aortic arch. Thoracic spondylosis. Suspected mild lingular scarring. IMPRESSION: 1. Chronically stable lingular scarring. Left hilar prominence is likewise chronic and felt to be vascular. 2.  Aortic Atherosclerosis (ICD10-I70.0). 3. Old granulomatous disease. Electronically Signed   By: Van Clines M.D.   On: 11/13/2017 16:03    Procedures Procedures (including critical care time)  Medications Ordered in ED Medications  alfuzosin (UROXATRAL) 24 hr tablet 10 mg (not administered)  aspirin EC tablet 81 mg (81 mg Oral Given 11/13/17 2316)  clobetasol cream (TEMOVATE) 0.05 % (not administered)  ezetimibe (ZETIA) tablet 10 mg (not administered)  glimepiride (AMARYL) tablet 2 mg (2 mg Oral Given 11/13/17 2317)  isosorbide mononitrate (IMDUR) 24 hr tablet 60 mg (60 mg Oral Given 11/13/17 2315)  levothyroxine (SYNTHROID, LEVOTHROID) tablet 150 mcg (not administered)  metoprolol succinate (TOPROL-XL) 24 hr tablet 50 mg (50 mg Oral Given 11/13/17 2315)  nitroGLYCERIN (NITROSTAT) SL tablet 0.4 mg (not administered)  pantoprazole (PROTONIX) EC tablet 40 mg (not administered)  rosuvastatin (CRESTOR) tablet 40 mg (  40 mg Oral Given 11/13/17 2315)  ondansetron (ZOFRAN) tablet 4 mg (not administered)    Or  ondansetron (ZOFRAN) injection 4 mg (not administered)  albuterol (PROVENTIL) (2.5 MG/3ML) 0.083% nebulizer solution 2.5 mg (not administered)  insulin aspart (novoLOG) injection 0-15 Units (not administered)  enoxaparin (LOVENOX) injection 40 mg (40  mg Subcutaneous Given 11/13/17 2317)  sodium chloride flush (NS) 0.9 % injection 3 mL (3 mLs Intravenous Given 11/13/17 2317)  sodium chloride flush (NS) 0.9 % injection 3 mL (not administered)  0.9 %  sodium chloride infusion (not administered)  acetaminophen (TYLENOL) tablet 650 mg (not administered)    Or  acetaminophen (TYLENOL) suppository 650 mg (not administered)  polyethylene glycol (MIRALAX / GLYCOLAX) packet 17 g (not administered)  ipratropium-albuterol (DUONEB) 0.5-2.5 (3) MG/3ML nebulizer solution 3 mL (not administered)  guaiFENesin (MUCINEX) 12 hr tablet 600 mg (600 mg Oral Given 11/13/17 2315)  loratadine (CLARITIN) tablet 10 mg (10 mg Oral Given 11/13/17 2315)  oxymetazoline (AFRIN) 0.05 % nasal spray 1 spray (1 spray Each Nare Not Given 11/13/17 2316)  fluticasone (FLONASE) 50 MCG/ACT nasal spray 2 spray (2 sprays Each Nare Not Given 11/13/17 2316)  methylPREDNISolone sodium succinate (SOLU-MEDROL) 125 mg/2 mL injection 60 mg (60 mg Intravenous Given 11/13/17 2316)  furosemide (LASIX) injection 40 mg (not administered)  oseltamivir (TAMIFLU) capsule 30 mg (30 mg Oral Given 11/13/17 2316)  ipratropium-albuterol (DUONEB) 0.5-2.5 (3) MG/3ML nebulizer solution 3 mL (3 mLs Nebulization Given 11/13/17 1400)  methylPREDNISolone sodium succinate (SOLU-MEDROL) 125 mg/2 mL injection 125 mg (125 mg Intravenous Given 11/13/17 1505)  acetaminophen (TYLENOL) tablet 1,000 mg (1,000 mg Oral Given 11/13/17 1505)  albuterol (PROVENTIL) (2.5 MG/3ML) 0.083% nebulizer solution 2.5 mg (2.5 mg Nebulization Given 11/13/17 1639)  cefTRIAXone (ROCEPHIN) 1 g in dextrose 5 % 50 mL IVPB (0 g Intravenous Stopped 11/13/17 1746)  azithromycin (ZITHROMAX) 500 mg in dextrose 5 % 250 mL IVPB (0 mg Intravenous Stopped 11/13/17 1747)  sodium chloride 0.9 % bolus 1,000 mL (0 mLs Intravenous Stopped 11/13/17 1815)     Initial Impression / Assessment and Plan / ED Course  I have reviewed the triage vital signs and the nursing  notes.  Pertinent labs & imaging results that were available during my care of the patient were reviewed by me and considered in my medical decision making (see chart for details).     84 y.o. M with PMH/o COPD, CAD, CHF who presents for evaluation of SOB x 2 days. Patient with a history of COPD and has had to increase inhaler use. Reports mild dry cough. No fevers.  On initial ED arrival, patient is febrile, tachycardic, and hypertensive. Initial O2 sat showed 99% on RA. When I initially evaluated patient, his O2 sat showed 86% on RA. Patient placed on 2L O2 with improvement. Patient with auditory wheezing and some decreased lung sounds noted. Consider COPD exacerbation vs acute infectious etiology vs CHF. Nebulizer and steroids ordered. Basic labs and CXR ordered. Given that patient is febrile and hypoxic, code sepsis initiated.  Given concern for pneumonia, antibiotics started.  Labs and imaging reviewed.  Troponin is negative.  Lactic acid is 1.75. BNP is unremarkable.  CBC witthout any significant leukocytosis.  CMP shows bicarb of 27.  Creatinine is 1.32.  Patient is positive for flu.  Chest x-ray shows chronically stable hilar prominence otherwise no evidence of infiltrate. Given that patient is not hypotensive and lactic acid is less than 4, will hold on full fluid resuscitation at this time  given CHF history. Will plan to give additional bolus of flood at this time.   Patient still requiring 2 L of continuous oxygen which is increased from baseline.  Patient reports improvement in breathing after nebulizer treatments, though he still requires oxygen.  Additionally, patient still having some mild wheezing.   Given history and risk factors, plan for admission.  Discussed patient with hospitalist. Will admit.    Final Clinical Impressions(s) / ED Diagnoses   Final diagnoses:  Influenza  Hypoxia    ED Discharge Orders    None       Volanda Napoleon, PA-C 11/14/17 0003    Volanda Napoleon, PA-C 11/14/17 0007    Orlie Dakin, MD 11/14/17 [phone]

## 2017-11-13 NOTE — ED Notes (Signed)
ED TO INPATIENT HANDOFF REPORT  Name/Age/Gender [name] 84 y.o. male  Code Status Code Status History    Date Active Date Inactive Code Status Order ID Comments User Context   05/22/2017 20:21 05/24/2017 15:25 Full Code [id number]  Patrecia Pour, MD Inpatient   09/22/2016 23:02 09/25/2016 19:37 Full Code [id number]  Norval Morton, MD Inpatient   11/16/2014 23:03 11/17/2014 19:45 Full Code [id number]  Berle Mull, MD ED   07/12/2014 18:51 07/14/2014 15:32 Full Code [id number]  Kelvin Cellar, MD Inpatient   08/21/2012 20:44 08/23/2012 14:01 Full Code [id number]  Birdie Hopes, RN Inpatient   08/12/2012 15:34 08/14/2012 17:25 Full Code [id number]  Theodis Blaze, MD Inpatient      Home/SNF/Other Home  Chief Complaint SOB  Level of Care/Admitting Diagnosis ED Disposition    ED Disposition Condition [place]: [place] [[id number]]  Level of Care: Telemetry [5]  Admit to tele based on following criteria: Acute CHF  Diagnosis: COPD exacerbation [facility]) [[id number]]  Admitting Physician: Jonetta Osgood [[id number]]  Attending Physician: Jonetta Osgood [[id number]]  Estimated length of stay: past midnight tomorrow  Certification:: I certify this patient will need inpatient services for at least 2 midnights  PT Class (Do Not Modify): Inpatient [101]  PT Acc Code (Do Not Modify): Private [1]       Medical History Past Medical History:  Diagnosis Date  . Anxiety   . Asthma   . Chronic diastolic CHF (congestive heart failure) (HCC)    diastolic   . COPD (chronic obstructive pulmonary disease) ([place])   . Coronary artery disease    s/p PCI of RCA 03/2009 at which time there was 70% ramus and 90% RCA, cath 01/2011 showed patent stents in RCA with moderate prox disesae, aneurysmal left circ and small 90% ramus s/p PCI, patent LAD EF 55%  . Diabetes mellitus   . Diverticulitis Oct. 2013   bleeding in the past and Effient for his CAD was stopped  . DVT  (deep venous thrombosis) ([place])   . GERD (gastroesophageal reflux disease)   . Hypercholesterolemia   . Hypertension   . Hypothyroidism   . Obesity (BMI 30-39.9)   . OSA (obstructive sleep apnea)    severe on CPAP  . Peripheral vascular disease ([place]) 11/2006   s/p left stent  . Shortness of breath   . Sleep apnea    severe OSA awaiting CPAP titration    Allergies Allergies  Allergen Reactions  . Clopidogrel Itching    IV Location/Drains/Wounds Patient Lines/Drains/Airways Status   Active Line/Drains/Airways    Name:   Placement date:   Placement time:   Site:   Days:   Peripheral IV 11/13/17 Right Antecubital   11/13/17    1925    Antecubital   less than 1   Peripheral IV 11/13/17 Right Wrist   11/13/17    1926    Wrist   less than 1          Labs/Imaging Results for orders placed or performed during the hospital encounter of 11/13/17 (from the past 48 hour(s))  CBC with Differential     Status: Abnormal   Collection Time: 11/13/17  2:25 PM  Result Value Ref Range   WBC 8.1 4.0 - 10.5 K/uL   RBC 4.71 4.22 - 5.81 MIL/uL   Hemoglobin 14.2 13.0 - 17.0 g/dL   HCT 41.7 39.0 - 52.0 %   MCV 88.5 78.0 -  100.0 fL   MCH 30.1 26.0 - 34.0 pg   MCHC 34.1 30.0 - 36.0 g/dL   RDW 13.6 11.5 - 15.5 %   Platelets 122 (L) 150 - 400 K/uL   Neutrophils Relative % 75 %   Neutro Abs 6.1 1.7 - 7.7 K/uL   Lymphocytes Relative 9 %   Lymphs Abs 0.7 0.7 - 4.0 K/uL   Monocytes Relative 16 %   Monocytes Absolute 1.3 (H) 0.1 - 1.0 K/uL   Eosinophils Relative 0 %   Eosinophils Absolute 0.0 0.0 - 0.7 K/uL   Basophils Relative 0 %   Basophils Absolute 0.0 0.0 - 0.1 K/uL  Comprehensive metabolic panel     Status: Abnormal   Collection Time: 11/13/17  2:25 PM  Result Value Ref Range   Sodium 132 (L) 135 - 145 mmol/L   Potassium 3.8 3.5 - 5.1 mmol/L   Chloride 93 (L) 101 - 111 mmol/L   CO2 27 22 - 32 mmol/L   Glucose, Bld 127 (H) 65 - 99 mg/dL   BUN 20 6 - 20 mg/dL   Creatinine, Ser 1.32 (H)  0.61 - 1.24 mg/dL   Calcium 9.4 8.9 - 10.3 mg/dL   Total Protein 7.7 6.5 - 8.1 g/dL   Albumin 4.4 3.5 - 5.0 g/dL   AST 89 (H) 15 - 41 U/L   ALT 51 17 - 63 U/L   Alkaline Phosphatase 42 38 - 126 U/L   Total Bilirubin 0.8 0.3 - 1.2 mg/dL   GFR calc non Af Amer 47 (L) >60 mL/min   GFR calc Af Amer 55 (L) >60 mL/min    Comment: (NOTE) The eGFR has been calculated using the CKD EPI equation. This calculation has not been validated in all clinical situations. eGFR's persistently <60 mL/min signify possible Chronic Kidney Disease.    Anion gap 12 5 - 15  Brain natriuretic peptide     Status: None   Collection Time: 11/13/17  2:25 PM  Result Value Ref Range   B Natriuretic Peptide 88.2 0.0 - 100.0 pg/mL  Influenza panel by PCR (type A & B)     Status: Abnormal   Collection Time: 11/13/17  2:26 PM  Result Value Ref Range   Influenza A By PCR POSITIVE (A) NEGATIVE   Influenza B By PCR NEGATIVE NEGATIVE    Comment: (NOTE) The Xpert Xpress Flu assay is intended as an aid in the diagnosis of  influenza and should not be used as a sole basis for treatment.  This  assay is FDA approved for nasopharyngeal swab specimens only. Nasal  washings and aspirates are unacceptable for Xpert Xpress Flu testing.   I-Stat Troponin, ED (not at [facility])     Status: None   Collection Time: 11/13/17  2:31 PM  Result Value Ref Range   Troponin i, poc 0.00 0.00 - 0.08 ng/mL   Comment 3            Comment: Due to the release kinetics of cTnI, a negative result within the first hours of the onset of symptoms does not rule out myocardial infarction with certainty. If myocardial infarction is still suspected, repeat the test at appropriate intervals.   I-Stat CG4 Lactic Acid, ED     Status: None   Collection Time: 11/13/17  2:33 PM  Result Value Ref Range   Lactic Acid, Venous 1.75 0.5 - 1.9 mmol/L  Urinalysis, Routine w reflex microscopic     Status: Abnormal   Collection Time:  11/13/17  5:18 PM  Result  Value Ref Range   Color, Urine YELLOW YELLOW   APPearance CLEAR CLEAR   Specific Gravity, Urine 1.013 1.005 - 1.030   pH 5.0 5.0 - 8.0   Glucose, UA NEGATIVE NEGATIVE mg/dL   Hgb urine dipstick SMALL (A) NEGATIVE   Bilirubin Urine NEGATIVE NEGATIVE   Ketones, ur 5 (A) NEGATIVE mg/dL   Protein, ur 30 (A) NEGATIVE mg/dL   Nitrite NEGATIVE NEGATIVE   Leukocytes, UA NEGATIVE NEGATIVE   RBC / HPF 0-5 0 - 5 RBC/hpf   WBC, UA 0-5 0 - 5 WBC/hpf   Bacteria, UA NONE SEEN NONE SEEN   Squamous Epithelial / LPF NONE SEEN NONE SEEN   Mucus PRESENT    Dg Chest 2 View  Result Date: 11/13/2017 CLINICAL DATA:  SOB started Tuesday 11/11/17 and worsened over the last 2 days; diabetic; quit 50 yrs ago; Hx COPD; CHF; Asthma EXAM: CHEST  2 VIEW COMPARISON:  05/22/2017 FINDINGS: Left hilar fullness has been present on a chronic basis and appears to be primarily due to vascular structures on the CT scan from 11/17/2014. Calcified granuloma in the left mid lung, chronically stable. Atherosclerotic calcification of the aortic arch. Thoracic spondylosis. Suspected mild lingular scarring. IMPRESSION: 1. Chronically stable lingular scarring. Left hilar prominence is likewise chronic and felt to be vascular. 2.  Aortic Atherosclerosis (ICD10-I70.0). 3. Old granulomatous disease. Electronically Signed   By: Van Clines M.D.   On: 11/13/2017 16:03    Pending Labs Unresulted Labs (From admission, onward)   Start     Ordered   11/13/17 1402  Blood Culture (routine x 2)  BLOOD CULTURE X 2,   STAT     11/13/17 1401   Signed and Held  Basic metabolic panel  Tomorrow morning,   R     Signed and Held   Signed and Held  CBC  Tomorrow morning,   R     Signed and Held   Signed and Held  CBC  (enoxaparin (LOVENOX)    CrCl >/= 30 ml/min)  Once,   R    Comments:  Baseline for enoxaparin therapy IF NOT ALREADY DRAWN.  Notify MD if PLT < 100 K.    Signed and Held   Signed and Held  Creatinine, serum  (enoxaparin (LOVENOX)     CrCl >/= 30 ml/min)  Once,   R    Comments:  Baseline for enoxaparin therapy IF NOT ALREADY DRAWN.    Signed and Held   Signed and Held  Creatinine, serum  (enoxaparin (LOVENOX)    CrCl >/= 30 ml/min)  Weekly,   R    Comments:  while on enoxaparin therapy    Signed and Held      Vitals/Pain Today's Vitals   11/13/17 1530 11/13/17 1621 11/13/17 1830 11/13/17 2039  BP: 133/63 126/65 (!) 154/74 140/63  Pulse: (!) 108 99 89 89  Resp: (!) _0 Temp:      TempSrc:      SpO2: 95% 91% 95% 97%  Weight:      Height:        Isolation Precautions No active isolations  Medications Medications  ipratropium-albuterol (DUONEB) 0.5-2.5 (3) MG/3ML nebulizer solution 3 mL (3 mLs Nebulization Given 11/13/17 1400)  methylPREDNISolone sodium succinate (SOLU-MEDROL) 125 mg/2 mL injection 125 mg (125 mg Intravenous Given 11/13/17 1505)  acetaminophen (TYLENOL) tablet 1,000 mg (1,000 mg Oral Given 11/13/17 1505)  albuterol (PROVENTIL) (2.5 MG/3ML) 0.083%  nebulizer solution 2.5 mg (2.5 mg Nebulization Given 11/13/17 1639)  cefTRIAXone (ROCEPHIN) 1 g in dextrose 5 % 50 mL IVPB (0 g Intravenous Stopped 11/13/17 1746)  azithromycin (ZITHROMAX) 500 mg in dextrose 5 % 250 mL IVPB (0 mg Intravenous Stopped 11/13/17 1747)  sodium chloride 0.9 % bolus 1,000 mL (0 mLs Intravenous Stopped 11/13/17 1815)    Mobility walks

## 2017-11-13 NOTE — H&P (Signed)
HISTORY AND PHYSICAL       PATIENT DETAILS Name: [name] Age: 84 y.o. Sex: male Date of Birth: 08-Mar-1930 Admit Date: 11/13/2017 CHE:NIDP, Nathen May, MD   Patient coming from: Home   CHIEF COMPLAINT:  Shortness of breath, cough, myalgias for the past 2-3 days  HPI: [name] is a 84 y.o. male with medical history significant of COPD on nocturnal home O2, OSA on CPAP, type 2 diabetes, CAD status post PCI ( last PCI 4-5 years back), chronic diastolic heart failure who presented to the ED for evaluation of the above-noted complaints.  Per patient he was at his usual state of health-on Tuesday he started noting weakness, poor appetite.  This was followed by mostly dry cough-he claims that he just cannot get any phlegm up.  He then started having nasal congestion and postnasal drip-like symptoms.  This was associated with myalgias as well.  For the past day or so his breathing started to get worse-his shortness of breath increase in spite of him using his nebulizers/bronchodilators at home.  He was evaluated in the emergency room-where further evaluation revealed influenza A and COPD exacerbation.  The hospitalist service was then asked to admit this patient for further evaluation and treatment.  Patient denies any headache-but does acknowledge some subjective fevers.  Patient denies any chest pain, nausea, vomiting, diarrhea abdominal pain  Patient does acknowledge worsening lower extremity swelling for the past couple of weeks.  ED Course:  Influenza PCR positive for influenza A-chest x-ray did not show any pneumonia-he was given steroids, bronchodilators and referred to the hospitalist service.  Note: Lives at: Home Mobility:  Independent Chronic Indwelling Foley:no   REVIEW OF SYSTEMS:  Constitutional:   No  weight loss, night sweats  HEENT:    No headaches, Dysphagia,Tooth/dental problems,Sore throat,   Cardio-vascular: No chest pain,Orthopnea, PND,  anasarca, palpitations  GI:  No heartburn, indigestion, abdominal pain, nausea, vomiting, diarrhea, melena or hematochezia  Resp: No  hemoptysis,plueritic chest pain.   Skin:  No rash or lesions.  GU:  No dysuria, change in color of urine, no urgency or frequency.  No flank pain.  Musculoskeletal: No joint pain or swelling.  No decreased range of motion.  No back pain.  Endocrine: No heat intolerance, no cold intolerance, no polyuria, no polydipsia  Psych: No change in mood or affect. No depression or anxiety.  No memory loss.   ALLERGIES:   Allergies  Allergen Reactions  . Clopidogrel Itching    PAST MEDICAL HISTORY: Past Medical History:  Diagnosis Date  . Anxiety   . Asthma   . Chronic diastolic CHF (congestive heart failure) (HCC)    diastolic   . COPD (chronic obstructive pulmonary disease) ([place])   . Coronary artery disease    s/p PCI of RCA 03/2009 at which time there was 70% ramus and 90% RCA, cath 01/2011 showed patent stents in RCA with moderate prox disesae, aneurysmal left circ and small 90% ramus s/p PCI, patent LAD EF 55%  . Diabetes mellitus   . Diverticulitis Oct. 2013   bleeding in the past and Effient for his CAD was stopped  . DVT (deep venous thrombosis) ([place])   . GERD (gastroesophageal reflux disease)   . Hypercholesterolemia   . Hypertension   . Hypothyroidism   . Obesity (BMI 30-39.9)   . OSA (obstructive sleep apnea)    severe on CPAP  . Peripheral vascular disease ([place]) 11/2006   s/p  left stent  . Shortness of breath   . Sleep apnea    severe OSA awaiting CPAP titration    PAST SURGICAL HISTORY: Past Surgical History:  Procedure Laterality Date  . ABDOMINAL AORTAGRAM N/A 04/20/2012   Procedure: ABDOMINAL Maxcine Ham;  Surgeon: Angelia Mould, MD;  Location: [facility] CATH LAB;  Service: Cardiovascular;  Laterality: N/A;  . ABDOMINAL AORTAGRAM N/A 12/29/2012   Procedure: ABDOMINAL Maxcine Ham;  Surgeon: Serafina Mitchell, MD;  Location: [facility]  CATH LAB;  Service: Cardiovascular;  Laterality: N/A;  . BALLOON DILATION  12/26/2011   Procedure: BALLOON DILATION;  Surgeon: Garlan Fair, MD;  Location: [facility] ENDOSCOPY;  Service: Endoscopy;  Laterality: N/A;  . COLONOSCOPY  08/14/2012   Procedure: COLONOSCOPY;  Surgeon: Arta Silence, MD;  Location: [facility] ENDOSCOPY;  Service: Endoscopy;  Laterality: N/A;  . CORONARY STENT PLACEMENT  2010 and 2012  . ESOPHAGOGASTRODUODENOSCOPY  12/26/2011   Procedure: ESOPHAGOGASTRODUODENOSCOPY (EGD);  Surgeon: Garlan Fair, MD;  Location: [facility] ENDOSCOPY;  Service: Endoscopy;  Laterality: N/A;  . ESOPHAGOGASTRODUODENOSCOPY  08/13/2012   Procedure: ESOPHAGOGASTRODUODENOSCOPY (EGD);  Surgeon: Arta Silence, MD;  Location: [facility] ENDOSCOPY;  Service: Endoscopy;  Laterality: Left;  . HERNIA REPAIR  1992   evntral hernia repair  . LOWER EXTREMITY ANGIOGRAM Bilateral 12/29/2012   Procedure: LOWER EXTREMITY ANGIOGRAM;  Surgeon: Serafina Mitchell, MD;  Location: [facility] CATH LAB;  Service: Cardiovascular;  Laterality: Bilateral;  bilat lower extrem angio    MEDICATIONS AT HOME: Prior to Admission medications   Medication Sig Start Date End Date Taking? Authorizing Provider  albuterol (PROVENTIL HFA;VENTOLIN HFA) 108 (90 BASE) MCG/ACT inhaler Inhale 2 puffs into the lungs every 6 (six) hours as needed for wheezing or shortness of breath.    Yes [provider]  albuterol (PROVENTIL) (2.5 MG/3ML) 0.083% nebulizer solution Take 3 mLs (2.5 mg total) by nebulization every 4 (four) hours as needed for wheezing or shortness of breath. 09/25/16  Yes Patrecia Pour, MD  alfuzosin (UROXATRAL) 10 MG 24 hr tablet Take 10 mg by mouth daily. At 5 pm   Yes [provider]  aspirin EC 81 MG tablet Take 81 mg by mouth daily. At 5 pm   Yes [provider]  budesonide-formoterol (SYMBICORT) 160-4.5 MCG/ACT inhaler Inhale 2 puffs into the lungs 2 (two) times daily.   Yes [provider]  clobetasol cream  (TEMOVATE) 0.05 % APPLY TO AFFECTED AREA OF [place]/LEGS UP TO 2 TIMES DAILY AS NEEDED *NOT TO FACE,GROIN,OR UNDERARMS 09/03/17  Yes [provider]  ezetimibe (ZETIA) 10 MG tablet Take 1 tablet (10 mg total) by mouth daily. 09/09/17 12/08/17 Yes Turner, Eber Hong, MD  furosemide (LASIX) 20 MG tablet Take 1 tablet (20 mg total) by mouth daily. 06/30/17 11/13/17 Yes Turner, Eber Hong, MD  glimepiride (AMARYL) 2 MG tablet Take 2 mg by mouth daily. At 5 pm   Yes [provider]  isosorbide mononitrate (IMDUR) 60 MG 24 hr tablet Take 60 mg by mouth daily. At 5 pm   Yes [provider]  levothyroxine (SYNTHROID, LEVOTHROID) 150 MCG tablet Take 150 mcg by mouth daily before breakfast. 05/19/17  Yes [provider]  loperamide (IMODIUM) 2 MG capsule Take 2 mg by mouth as needed for diarrhea or loose stools (Take as directed).    Yes [provider]  meloxicam (MOBIC) 15 MG tablet Take 15 mg by mouth daily. 10/22/17  Yes [provider]  metoprolol succinate (TOPROL-XL) 50 MG 24 hr tablet TAKE  1 TABLET BY MOUTH EVERY DAY 02/11/17  Yes Turner, Traci R, MD  pantoprazole (PROTONIX) 40 MG tablet TAKE 1 TABLET (40 MG TOTAL) BY MOUTH 2 (TWO) TIMES DAILY. 07/29/17  Yes Turner, Eber Hong, MD  rosuvastatin (CRESTOR) 40 MG tablet Take 1 tablet (40 mg total) by mouth at bedtime. Patient taking differently: Take 40 mg by mouth daily. At 5 pm 12/06/16 12/01/17 Yes Turner, Traci R, MD  triamcinolone cream (KENALOG) 0.1 % APPLY TO AFFECTED AREA UP TO TWICE A DAY AS NEEDED NOT TO FACE/GROIN/UNDERARMS 08/29/17  Yes [provider]  nitroGLYCERIN (NITROSTAT) 0.4 MG SL tablet Place 0.4 mg under the tongue every 5 (five) minutes as needed for chest pain.    [provider]    FAMILY HISTORY: Family History  Problem Relation Age of Onset  . Diabetes Mother   . Hyperlipidemia Mother   . Hypertension Mother   . Heart attack Mother   . Heart disease Mother        before  age 67  . Diabetes Brother   . Heart disease Father        before age 27  . Breast cancer Sister   . Diabetes Sister   . Hyperlipidemia Sister   . Hypertension Sister   . Heart attack Sister   . Hyperlipidemia Sister   . Hypertension Sister   . Heart disease Sister        Heart Disease before age 55  . Asthma Sister   . Malignant hyperthermia Neg Hx     SOCIAL HISTORY:  reports that he quit smoking about 54 years ago. His smoking use included cigarettes and cigars. He has a 7.50 pack-year smoking history. he has never used smokeless tobacco. He reports that he does not drink alcohol or use drugs.  PHYSICAL EXAM: Blood pressure 126/65, pulse 99, temperature (!) 101.7 F (38.7 C), temperature source Oral, resp. rate 18, height 5\' 5"  (1.651 m), weight 93 kg (205 lb), SpO2 91 %.  General appearance :Awake, alert, not in any distress. Speech Clear.  Eyes:, pupils equally reactive to light and accomodation,no scleral icterus.Pink conjunctiva HEENT: Atraumatic and Normocephalic Neck: supple, no JVD. No cervical lymphadenopathy. No thyromegaly Resp:Good air entry bilaterally, coarse rhonchi all over CVS: S1 S2 regular, no murmurs.  GI: Bowel sounds present, Non tender and not distended with no gaurding, rigidity or rebound.No organomegaly Extremities: B/L Lower Ext shows ++edema, both legs are warm to touch Neurology:  speech clear,Non focal, sensation is grossly intact. Psychiatric: Normal judgment and insight. Alert and oriented x 3. Normal mood. Musculoskeletal:gait appears to be normal.No digital cyanosis Skin:warm and dry Wounds:N/A  LABS ON ADMISSION:  I have personally reviewed following labs and imaging studies  CBC: Recent Labs  Lab 11/13/17 1425  WBC 8.1  NEUTROABS 6.1  HGB 14.2  HCT 41.7  MCV 88.5  PLT 122*    Basic Metabolic Panel: Recent Labs  Lab 11/13/17 1425  NA 132*  K 3.8  CL 93*  CO2 27  GLUCOSE 127*  BUN 20  CREATININE 1.32*  CALCIUM 9.4     GFR: Estimated Creatinine Clearance: 42.9 mL/min (A) (by C-G formula based on SCr of 1.32 mg/dL (H)).  Liver Function Tests: Recent Labs  Lab 11/13/17 1425  AST 89*  ALT 51  ALKPHOS 42  BILITOT 0.8  PROT 7.7  ALBUMIN 4.4   No results for input(s): LIPASE, AMYLASE in the last 168 hours. No results for input(s): AMMONIA in the last 168  hours.  Coagulation Profile: No results for input(s): INR, PROTIME in the last 168 hours.  Cardiac Enzymes: No results for input(s): CKTOTAL, CKMB, CKMBINDEX, TROPONINI in the last 168 hours.  BNP (last 3 results) No results for input(s): PROBNP in the last 8760 hours.  HbA1C: No results for input(s): HGBA1C in the last 72 hours.  CBG: No results for input(s): GLUCAP in the last 168 hours.  Lipid Profile: No results for input(s): CHOL, HDL, LDLCALC, TRIG, CHOLHDL, LDLDIRECT in the last 72 hours.  Thyroid Function Tests: No results for input(s): TSH, T4TOTAL, FREET4, T3FREE, THYROIDAB in the last 72 hours.  Anemia Panel: No results for input(s): VITAMINB12, FOLATE, FERRITIN, TIBC, IRON, RETICCTPCT in the last 72 hours.  Urine analysis:    Component Value Date/Time   COLORURINE YELLOW 05/22/2017 [place] 05/22/2017 1403   LABSPEC 1.014 05/22/2017 1403   PHURINE 6.0 05/22/2017 1403   GLUCOSEU NEGATIVE 05/22/2017 1403   HGBUR SMALL (A) 05/22/2017 1403   BILIRUBINUR NEGATIVE 05/22/2017 1403   KETONESUR NEGATIVE 05/22/2017 1403   PROTEINUR NEGATIVE 05/22/2017 1403   UROBILINOGEN 0.2 07/12/2014 1702   NITRITE NEGATIVE 05/22/2017 1403   LEUKOCYTESUR NEGATIVE 05/22/2017 1403    Sepsis Labs: Lactic Acid, Venous    Component Value Date/Time   LATICACIDVEN 1.75 11/13/2017 1433     Microbiology: No results found for this or any previous visit (from the past 240 hour(s)).    RADIOLOGIC STUDIES ON ADMISSION: Dg Chest 2 View  Result Date: 11/13/2017 CLINICAL DATA:  SOB started Tuesday 11/11/17 and worsened  over the last 2 days; diabetic; quit 50 yrs ago; Hx COPD; CHF; Asthma EXAM: CHEST  2 VIEW COMPARISON:  05/22/2017 FINDINGS: Left hilar fullness has been present on a chronic basis and appears to be primarily due to vascular structures on the CT scan from 11/17/2014. Calcified granuloma in the left mid lung, chronically stable. Atherosclerotic calcification of the aortic arch. Thoracic spondylosis. Suspected mild lingular scarring. IMPRESSION: 1. Chronically stable lingular scarring. Left hilar prominence is likewise chronic and felt to be vascular. 2.  Aortic Atherosclerosis (ICD10-I70.0). 3. Old granulomatous disease. Electronically Signed   By: Van Clines M.D.   On: 11/13/2017 16:03    I have personally reviewed images of chest xray   EKG:  Sinus tachycardia-Personally reviewed.   ASSESSMENT AND PLAN: COPD exacerbation secondary to influenza A: He appears comfortable-but is still wheezing-but is moving air well-start steroids, bronchodilators and Tamiflu.  He also appears to have significant nasal congestion and postnasal drip-hence we will start him on Claritin, Afrin and Flonase as well.  Will be monitored closely and adjustments to his treatment regimen will be made accordingly.  Acute on chronic diastolic heart failure: He appears to have significant lower extremity edema-he is not aware what his usual dry weight is supposed to be at-hold oral Lasix-start IV Lasix, follow weights, intake and output.  Acute on chronic hypoxemic respiratory failure: Secondary to COPD exacerbation/influenza A-will slowly titrate down oxygen to his usual home regimen.  CAD: No anginal symptoms-his last PCI was apparently 4-5 years back.  Continue aspirin, metoprolol and Crestor.  DM-2: Hold all oral hypoglycemic agents-start SSI and follow.  Hypertension: Continue imdur, metoprolol-follow and adjust accordingly  Chronic kidney disease stage III: Creatinine appears to be close to his usual  baseline-follow.  Hypothyroidism: Continue Synthroid  Dyslipidemia: Continue Crestor and Zetia  OSA: Continue CPAP nightly  Further plan will depend as patient's clinical course evolves and further radiologic and laboratory  data become available. Patient will be monitored closely.  Above noted plan was discussed with patient/daughter face to face at bedside, they were in agreement.   CONSULTS: None  DVT Prophylaxis: Prophylactic Lovenox  Code Status: Full Code  Disposition Plan:  Discharge back home  possibly in  2-3 days, depending on clinical course  Admission status: Inpatient  going to tele  The medical decision making on this patient was of high complexity and the patient is at high risk for clinical deterioration, therefore this is a level 3 visit.  Total time spent  55 minutes.Greater than 50% of this time was spent in counseling, explanation of diagnosis, planning of further management, and coordination of care.  Oren Binet [facility] Pager [phone]  If 7PM-7AM, please contact night-coverage [email or web address] Password [facility] [place] 11/13/2017, 5:40 PM

## 2017-11-13 NOTE — ED Triage Notes (Signed)
Patient brought in by EMS from home for SOB. Patient started feeling SOB on Tuesday and has been getting worse. Patient has been wheezing. Patient has a history of COPD.

## 2017-11-13 NOTE — ED Notes (Signed)
Bed: TG28 Expected date:  Expected time:  Means of arrival:  Comments: Hold for hall d

## 2017-11-13 NOTE — ED Provider Notes (Signed)
Shortness of breath progressively worsening accompanied by mild cough onset 2 days ago.  No other associated symptoms.  On exam alert nontoxic Glasgow Coma Scale 15 lungs with expiratory wheezes with prolonged expiratory phase heart tachycardic regular rhythm abdomen soft nontender bilateral lower extremities with 2+ pitting edema   Orlie Dakin, MD 11/13/17 1411

## 2017-11-13 NOTE — ED Notes (Signed)
Bed: [facility] Expected date:  Expected time:  Means of arrival:  Comments: 

## 2017-11-13 NOTE — Progress Notes (Signed)
PHARMACY NOTE -  ANTIBIOTIC RENAL DOSE ADJUSTMENT   Request received for Pharmacy to assist with antibiotic renal dose adjustment.  Patient has been initiated on oseltamivir 30mg  po bid  for Influenza. SCr 1.32, estimated CrCl 42 ml/min Current dosage is appropriate and need for further dosage adjustment appears unlikely at present. Will sign off at this time.  Please reconsult if a change in clinical status warrants re-evaluation of dosage.

## 2017-11-14 ENCOUNTER — Other Ambulatory Visit: Payer: Self-pay

## 2017-11-14 DIAGNOSIS — J9621 Acute and chronic respiratory failure with hypoxia: Secondary | ICD-10-CM | POA: Diagnosis not present

## 2017-11-14 DIAGNOSIS — E1122 Type 2 diabetes mellitus with diabetic chronic kidney disease: Secondary | ICD-10-CM | POA: Diagnosis not present

## 2017-11-14 DIAGNOSIS — Z888 Allergy status to other drugs, medicaments and biological substances status: Secondary | ICD-10-CM | POA: Diagnosis not present

## 2017-11-14 DIAGNOSIS — J441 Chronic obstructive pulmonary disease with (acute) exacerbation: Principal | ICD-10-CM

## 2017-11-14 DIAGNOSIS — J449 Chronic obstructive pulmonary disease, unspecified: Secondary | ICD-10-CM | POA: Diagnosis present

## 2017-11-14 DIAGNOSIS — I130 Hypertensive heart and chronic kidney disease with heart failure and stage 1 through stage 4 chronic kidney disease, or unspecified chronic kidney disease: Secondary | ICD-10-CM | POA: Diagnosis not present

## 2017-11-14 DIAGNOSIS — Z6830 Body mass index (BMI) 30.0-30.9, adult: Secondary | ICD-10-CM | POA: Diagnosis not present

## 2017-11-14 DIAGNOSIS — Z7984 Long term (current) use of oral hypoglycemic drugs: Secondary | ICD-10-CM | POA: Diagnosis not present

## 2017-11-14 DIAGNOSIS — J111 Influenza due to unidentified influenza virus with other respiratory manifestations: Secondary | ICD-10-CM | POA: Diagnosis not present

## 2017-11-14 DIAGNOSIS — E669 Obesity, unspecified: Secondary | ICD-10-CM | POA: Diagnosis not present

## 2017-11-14 DIAGNOSIS — Z86718 Personal history of other venous thrombosis and embolism: Secondary | ICD-10-CM | POA: Diagnosis not present

## 2017-11-14 DIAGNOSIS — Z791 Long term (current) use of non-steroidal anti-inflammatories (NSAID): Secondary | ICD-10-CM | POA: Diagnosis not present

## 2017-11-14 DIAGNOSIS — Z87891 Personal history of nicotine dependence: Secondary | ICD-10-CM | POA: Diagnosis not present

## 2017-11-14 DIAGNOSIS — I10 Essential (primary) hypertension: Secondary | ICD-10-CM | POA: Diagnosis not present

## 2017-11-14 DIAGNOSIS — E039 Hypothyroidism, unspecified: Secondary | ICD-10-CM | POA: Diagnosis not present

## 2017-11-14 DIAGNOSIS — I5033 Acute on chronic diastolic (congestive) heart failure: Secondary | ICD-10-CM | POA: Diagnosis not present

## 2017-11-14 DIAGNOSIS — N183 Chronic kidney disease, stage 3 (moderate): Secondary | ICD-10-CM | POA: Diagnosis not present

## 2017-11-14 DIAGNOSIS — I2510 Atherosclerotic heart disease of native coronary artery without angina pectoris: Secondary | ICD-10-CM | POA: Diagnosis not present

## 2017-11-14 DIAGNOSIS — E7800 Pure hypercholesterolemia, unspecified: Secondary | ICD-10-CM | POA: Diagnosis not present

## 2017-11-14 DIAGNOSIS — G4733 Obstructive sleep apnea (adult) (pediatric): Secondary | ICD-10-CM | POA: Diagnosis not present

## 2017-11-14 DIAGNOSIS — Z79890 Hormone replacement therapy (postmenopausal): Secondary | ICD-10-CM | POA: Diagnosis not present

## 2017-11-14 DIAGNOSIS — R0902 Hypoxemia: Secondary | ICD-10-CM | POA: Diagnosis not present

## 2017-11-14 LAB — BASIC METABOLIC PANEL
ANION GAP: 8 (ref 5–15)
BUN: 27 mg/dL — AB (ref 6–20)
CHLORIDE: 100 mmol/L — AB (ref 101–111)
CO2: 30 mmol/L (ref 22–32)
Calcium: 9.4 mg/dL (ref 8.9–10.3)
Creatinine, Ser: 1.38 mg/dL — ABNORMAL HIGH (ref 0.61–1.24)
GFR, EST AFRICAN AMERICAN: 52 mL/min — AB (ref >60)
GFR, EST NON AFRICAN AMERICAN: 45 mL/min — AB (ref >60)
Glucose, Bld: 198 mg/dL — ABNORMAL HIGH (ref 65–99)
POTASSIUM: 4.8 mmol/L (ref 3.5–5.1)
Sodium: 138 mmol/L (ref 135–145)

## 2017-11-14 LAB — CBC
HEMATOCRIT: 42 % (ref 39.0–52.0)
HEMOGLOBIN: 13.5 g/dL (ref 13.0–17.0)
MCH: 29 pg (ref 26.0–34.0)
MCHC: 32.1 g/dL (ref 30.0–36.0)
MCV: 90.1 fL (ref 78.0–100.0)
Platelets: 129 10*3/uL — ABNORMAL LOW (ref 150–400)
RBC: 4.66 MIL/uL (ref 4.22–5.81)
RDW: 14.1 % (ref 11.5–15.5)
WBC: 9.5 10*3/uL (ref 4.0–10.5)

## 2017-11-14 LAB — GLUCOSE, CAPILLARY: Glucose-Capillary: 184 mg/dL — ABNORMAL HIGH (ref 65–99)

## 2017-11-14 MED ORDER — OSELTAMIVIR PHOSPHATE 30 MG PO CAPS: 30.0000 mg | ORAL_CAPSULE | Freq: Two times a day (BID) | ORAL | 0 refills | Status: AC

## 2017-11-14 MED ORDER — FUROSEMIDE 40 MG PO TABS: 40.0000 mg | ORAL_TABLET | Freq: Every day | ORAL | 0 refills | Status: DC

## 2017-11-14 MED ORDER — IPRATROPIUM-ALBUTEROL 0.5-2.5 (3) MG/3ML IN SOLN
3.0000 mL | Freq: Three times a day (TID) | RESPIRATORY_TRACT | Status: DC
  Administered 2017-11-14 (×2): 3 mL via RESPIRATORY_TRACT
  Filled 2017-11-14 (×2): qty 3

## 2017-11-14 MED ORDER — PREDNISONE 5 MG PO TABS: 5.0000 mg | ORAL_TABLET | Freq: Every day | ORAL | Status: DC

## 2017-11-14 NOTE — Care Management CC44 (Signed)
Condition Code 44 Documentation Completed  Patient Details  Name: [name] MRN: [id number] Date of Birth: [birth year]   Condition Code 44 given:  Yes Patient signature on Condition Code 44 notice:  Yes Documentation of 2 MD's agreement:  Yes Code 44 added to claim:  Yes    Dessa Phi, RN 11/14/2017, 3:32 PM

## 2017-11-14 NOTE — Care Management Note (Signed)
Case Management Note  Patient Details  Name: HARRY SHUCK MRN: [id number] Date of Birth: 09-Aug-1930  Subjective/Objective:  84 y/o m admitted w/COPD. From home. Already has home 02 @ HS.                   Action/Plan:d/c plan home.   Expected Discharge Date:  (unknown)               Expected Discharge Plan:  Home/Self Care  In-House Referral:     Discharge planning Services  CM Consult  Post Acute Care Choice:  Durable Medical Equipment(Home 02) Choice offered to:     DME Arranged:    DME Agency:     HH Arranged:    HH Agency:     Status of Service:  In process, will continue to follow  If discussed at Long Length of Stay Meetings, dates discussed:    Additional Comments:  Dessa Phi, RN 11/14/2017, 10:04 AM

## 2017-11-14 NOTE — Discharge Summary (Signed)
Physician Discharge Summary  ANDERSON MIDDLEBROOKS [facility]:[id number] DOB: Nov 10, 1929 DOA: 11/13/2017  PCP: [name], MD  Admit date: 11/13/2017 Discharge date: 11/14/2017  Admitted From: Home Disposition:  Home  Recommendations for Outpatient Follow-up:  1. Follow up with PCP in 1-2 weeks  Discharge Condition:Improved CODE STATUS:Full Diet recommendation: Heart healthy   Brief/Interim Summary: 84 y.o. male with medical history significant of COPD on nocturnal home O2, OSA on CPAP, type 2 diabetes, CAD status post PCI ( last PCI 4-5 years back), chronic diastolic heart failure who presented to the ED for evaluation of the above-noted complaints.  Per patient he was at his usual state of health-on Tuesday he started noting weakness, poor appetite.  This was followed by mostly dry cough-he claims that he just cannot get any phlegm up.  He then started having nasal congestion and postnasal drip-like symptoms.  This was associated with myalgias as well.  For the past day or so his breathing started to get worse-his shortness of breath increase in spite of him using his nebulizers/bronchodilators at home.  He was evaluated in the emergency room-where further evaluation revealed influenza A and COPD exacerbation.  COPD exacerbation secondary to influenza A: Patient presented with poor air movement and was continued on bronchodilator and steroids with Tamiflu. Patient was successfully weaned off of O2 to room air. Patient to complete prednisone taper on discharge  Acute on chronic diastolic heart failure: Clinical improvement following IV lasix. CXR found to be clear  Acute on chronic hypoxemic respiratory failure: Secondary to COPD exacerbation/influenza A. Successfully weaned to room air  CAD: No anginal symptoms-his last PCI was apparently 4-5 years back.  Continued aspirin, metoprolol and Crestor.  DM-2: Hold all oral hypoglycemic agents. Patient was continued on SSI while admitted. To resume  home meds on discharge  Hypertension: Continue imdur, metoprolol-follow and adjust accordingly  Chronic kidney disease stage III: Creatinine appears to be close to his usual baseline.  Hypothyroidism: Continue Synthroid  Dyslipidemia: Continue Crestor and Zetia  OSA: Continued CPAP nightly    Discharge Diagnoses:  Principal Problem:   COPD exacerbation ([place]) Active Problems:   HYPERTENSION, BENIGN   OSA (obstructive sleep apnea)   Chronic diastolic CHF (congestive heart failure) ([place])   Influenza A    Discharge Instructions   Allergies as of 11/14/2017      Reactions   Clopidogrel Itching      Medication List    TAKE these medications   albuterol (2.5 MG/3ML) 0.083% nebulizer solution Commonly known as:  PROVENTIL Take 3 mLs (2.5 mg total) by nebulization every 4 (four) hours as needed for wheezing or shortness of breath.   albuterol 108 (90 Base) MCG/ACT inhaler Commonly known as:  PROVENTIL HFA;VENTOLIN HFA Inhale 2 puffs into the lungs every 6 (six) hours as needed for wheezing or shortness of breath.   alfuzosin 10 MG 24 hr tablet Commonly known as:  UROXATRAL Take 10 mg by mouth daily. At 5 pm   aspirin EC 81 MG tablet Take 81 mg by mouth daily. At 5 pm   budesonide-formoterol 160-4.5 MCG/ACT inhaler Commonly known as:  SYMBICORT Inhale 2 puffs into the lungs 2 (two) times daily.   clobetasol cream 0.05 % Commonly known as:  TEMOVATE APPLY TO AFFECTED AREA OF [place]/LEGS UP TO 2 TIMES DAILY AS NEEDED *NOT TO FACE,GROIN,OR UNDERARMS   ezetimibe 10 MG tablet Commonly known as:  ZETIA Take 1 tablet (10 mg total) by mouth daily.   furosemide 40 MG tablet Commonly  known as:  LASIX Take 1 tablet (40 mg total) by mouth daily. What changed:    medication strength  how much to take   glimepiride 2 MG tablet Commonly known as:  AMARYL Take 2 mg by mouth daily. At 5 pm   isosorbide mononitrate 60 MG 24 hr tablet Commonly known as:  IMDUR Take  60 mg by mouth daily. At 5 pm   levothyroxine 150 MCG tablet Commonly known as:  SYNTHROID, LEVOTHROID Take 150 mcg by mouth daily before breakfast.   loperamide 2 MG capsule Commonly known as:  IMODIUM Take 2 mg by mouth as needed for diarrhea or loose stools (Take as directed).   meloxicam 15 MG tablet Commonly known as:  MOBIC Take 15 mg by mouth daily.   metoprolol succinate 50 MG 24 hr tablet Commonly known as:  TOPROL-XL TAKE 1 TABLET BY MOUTH EVERY DAY   nitroGLYCERIN 0.4 MG SL tablet Commonly known as:  NITROSTAT Place 0.4 mg under the tongue every 5 (five) minutes as needed for chest pain.   oseltamivir 30 MG capsule Commonly known as:  TAMIFLU Take 1 capsule (30 mg total) by mouth 2 (two) times daily for 7 days.   pantoprazole 40 MG tablet Commonly known as:  PROTONIX TAKE 1 TABLET (40 MG TOTAL) BY MOUTH 2 (TWO) TIMES DAILY.   predniSONE 5 MG tablet Commonly known as:  DELTASONE Take 1 tablet (5 mg total) by mouth daily with breakfast.   rosuvastatin 40 MG tablet Commonly known as:  CRESTOR Take 1 tablet (40 mg total) by mouth at bedtime. What changed:    when to take this  additional instructions   triamcinolone cream 0.1 % Commonly known as:  KENALOG APPLY TO AFFECTED AREA UP TO TWICE A DAY AS NEEDED NOT TO FACE/GROIN/UNDERARMS       Allergies  Allergen Reactions  . Clopidogrel Itching     Procedures/Studies: Dg Chest 2 View  Result Date: 11/13/2017 CLINICAL DATA:  SOB started Tuesday 11/11/17 and worsened over the last 2 days; diabetic; quit 50 yrs ago; Hx COPD; CHF; Asthma EXAM: CHEST  2 VIEW COMPARISON:  05/22/2017 FINDINGS: Left hilar fullness has been present on a chronic basis and appears to be primarily due to vascular structures on the CT scan from 11/17/2014. Calcified granuloma in the left mid lung, chronically stable. Atherosclerotic calcification of the aortic arch. Thoracic spondylosis. Suspected mild lingular scarring. IMPRESSION: 1.  Chronically stable lingular scarring. Left hilar prominence is likewise chronic and felt to be vascular. 2.  Aortic Atherosclerosis (ICD10-I70.0). 3. Old granulomatous disease. Electronically Signed   By: Van Clines M.D.   On: 11/13/2017 16:03    Subjective: Eager to go home  Discharge Exam: Vitals:   11/14/17 0512 11/14/17 0943  BP: 136/82   Pulse: 78   Resp: 16   Temp: 98 F (36.7 C)   SpO2: 93% 91%   Vitals:   11/13/17 2231 11/14/17 0150 11/14/17 0512 11/14/17 0943  BP:   136/82   Pulse:   78   Resp:   16   Temp:   98 F (36.7 C)   TempSrc:   Axillary   SpO2:  97% 93% 91%  Weight: 91.5 kg (201 lb 11.5 oz)     Height: 5\' 4"  (1.626 m)       General: Pt is alert, awake, not in acute distress Cardiovascular: RRR, S1/S2 +, no rubs, no gallops Respiratory: CTA bilaterally, no wheezing, no rhonchi Abdominal: Soft, NT, ND, bowel  sounds + Extremities: no edema, no cyanosis   The results of significant diagnostics from this hospitalization (including imaging, microbiology, ancillary and laboratory) are listed below for reference.     Microbiology: No results found for this or any previous visit (from the past 240 hour(s)).   Labs: BNP (last 3 results) Recent Labs    05/22/17 1355 11/13/17 1425  BNP 23.3 01.7   Basic Metabolic Panel: Recent Labs  Lab 11/13/17 1425 11/14/17 0514  NA 132* 138  K 3.8 4.8  CL 93* 100*  CO2 27 30  GLUCOSE 127* 198*  BUN 20 27*  CREATININE 1.32* 1.38*  CALCIUM 9.4 9.4   Liver Function Tests: Recent Labs  Lab 11/13/17 1425  AST 89*  ALT 51  ALKPHOS 42  BILITOT 0.8  PROT 7.7  ALBUMIN 4.4   No results for input(s): LIPASE, AMYLASE in the last 168 hours. No results for input(s): AMMONIA in the last 168 hours. CBC: Recent Labs  Lab 11/13/17 1425 11/14/17 0514  WBC 8.1 9.5  NEUTROABS 6.1  --   HGB 14.2 13.5  HCT 41.7 42.0  MCV 88.5 90.1  PLT 122* 129*   Cardiac Enzymes: No results for input(s): CKTOTAL,  CKMB, CKMBINDEX, TROPONINI in the last 168 hours. BNP: Invalid input(s): POCBNP CBG: Recent Labs  Lab 11/13/17 2220 11/14/17 0732  GLUCAP 240* 184*   D-Dimer No results for input(s): DDIMER in the last 72 hours. Hgb A1c No results for input(s): HGBA1C in the last 72 hours. Lipid Profile No results for input(s): CHOL, HDL, LDLCALC, TRIG, CHOLHDL, LDLDIRECT in the last 72 hours. Thyroid function studies No results for input(s): TSH, T4TOTAL, T3FREE, THYROIDAB in the last 72 hours.  Invalid input(s): FREET3 Anemia work up No results for input(s): VITAMINB12, FOLATE, FERRITIN, TIBC, IRON, RETICCTPCT in the last 72 hours. Urinalysis    Component Value Date/Time   COLORURINE YELLOW 11/13/2017 1718   APPEARANCEUR CLEAR 11/13/2017 1718   LABSPEC 1.013 11/13/2017 1718   PHURINE 5.0 11/13/2017 1718   GLUCOSEU NEGATIVE 11/13/2017 1718   HGBUR SMALL (A) 11/13/2017 1718   BILIRUBINUR NEGATIVE 11/13/2017 1718   KETONESUR 5 (A) 11/13/2017 1718   PROTEINUR 30 (A) 11/13/2017 1718   UROBILINOGEN 0.2 07/12/2014 1702   NITRITE NEGATIVE 11/13/2017 1718   LEUKOCYTESUR NEGATIVE 11/13/2017 1718   Sepsis Labs Invalid input(s): PROCALCITONIN,  WBC,  LACTICIDVEN Microbiology No results found for this or any previous visit (from the past 240 hour(s)).   SIGNED:   Marylu Lund, MD  [facility] 11/14/2017, 2:07 PM  If 7PM-7AM, please contact night-coverage [email or web address] Password [email or web address]

## 2017-11-14 NOTE — Progress Notes (Signed)
Nutrition Brief Note  Patient identified on the Malnutrition Screening Tool (MST) Report  Wt Readings from Last 15 Encounters:  11/13/17 201 lb 11.5 oz (91.5 kg)  06/30/17 209 lb 3.2 oz (94.9 kg)  05/22/17 206 lb (93.4 kg)  03/11/17 218 lb 3.2 oz (99 kg)  01/14/17 215 lb 1.9 oz (97.6 kg)  12/12/16 221 lb (100.2 kg)  12/10/16 209 lb (94.8 kg)  12/05/16 221 lb 12.8 oz (100.6 kg)  11/12/16 214 lb (97.1 kg)  11/05/16 214 lb (97.1 kg)  10/29/16 215 lb (97.5 kg)  10/22/16 215 lb (97.5 kg)  10/21/16 214 lb (97.1 kg)  10/16/16 220 lb (99.8 kg)  09/22/16 212 lb 1.6 oz (96.2 kg)    Body mass index is 34.63 kg/m. Patient meets criteria for obesity based on current BMI. Pt has lost 8 lbs (3.8% body weight) in the past 4 months; this is not significant for time frame. Skin WDl.   Current diet order is Carb Modified, patient is consuming approximately 100% of meals at this time.  Medications reviewed; 40 mg IV Lasix BID, sliding scale Novolog, 150 mcg oral Synthroid/day, 150 mg IV Solu-medrol/day, 60 mg Solu-medrol TID. Labs reviewed; CBG: 184 mg/dL this AM, Cl: 100 mmol/L, BUN: 27 mg/dL, creatinine: 1.38 mg/dL, GFR: 45 mL/min.   No nutrition interventions warranted at this time. If nutrition issues arise, please consult RD.      Jarome Matin, MS, RD, LDN, [facility] Inpatient Clinical Dietitian Pager # [phone] After hours/weekend pager # [phone]

## 2017-11-14 NOTE — Care Management Obs Status (Signed)
[place] NOTIFICATION   Patient Details  Name: [name] MRN: [id number] Date of Birth: 06-14-1930   Medicare Observation Status Notification Given:  Yes    MahabirJuliann Pulse, RN 11/14/2017, 3:31 PM

## 2017-11-14 NOTE — Care Management Note (Signed)
Case Management Note  Patient Details  Name: [name] MRN: [id number] Date of Birth: July 07, 1930  Subjective/Objective: No further CM needs.                   Action/Plan:d/c home.   Expected Discharge Date:  11/14/17               Expected Discharge Plan:  Home/Self Care  In-House Referral:     Discharge planning Services  CM Consult  Post Acute Care Choice:  Durable Medical Equipment(Home 02) Choice offered to:     DME Arranged:    DME Agency:     HH Arranged:    HH Agency:     Status of Service:  Completed, signed off  If discussed at [employer] of Stay Meetings, dates discussed:    Additional Comments:  Dessa Phi, RN 11/14/2017, 3:34 PM

## 2017-11-14 NOTE — Care Management Obs Status (Signed)
[place] NOTIFICATION   Patient Details  Name: [name] MRN: [id number] Date of Birth: 04/17/1930   Medicare Observation Status Notification Given:  Yes    MahabirJuliann Pulse, RN 11/14/2017, 3:34 PM

## 2017-11-17 LAB — GLUCOSE, CAPILLARY: Glucose-Capillary: 273 mg/dL — ABNORMAL HIGH (ref 65–99)

## 2017-11-18 LAB — CULTURE, BLOOD (ROUTINE X 2)
CULTURE: NO GROWTH
CULTURE: NO GROWTH
SPECIAL REQUESTS: ADEQUATE
Special Requests: ADEQUATE

## 2017-11-23 DIAGNOSIS — I5032 Chronic diastolic (congestive) heart failure: Secondary | ICD-10-CM | POA: Diagnosis not present

## 2017-11-28 DIAGNOSIS — J101 Influenza due to other identified influenza virus with other respiratory manifestations: Secondary | ICD-10-CM | POA: Diagnosis not present

## 2017-11-28 DIAGNOSIS — J441 Chronic obstructive pulmonary disease with (acute) exacerbation: Secondary | ICD-10-CM | POA: Diagnosis not present

## 2017-11-28 DIAGNOSIS — J9691 Respiratory failure, unspecified with hypoxia: Secondary | ICD-10-CM | POA: Diagnosis not present

## 2017-12-21 DIAGNOSIS — I5032 Chronic diastolic (congestive) heart failure: Secondary | ICD-10-CM | POA: Diagnosis not present

## 2018-01-06 ENCOUNTER — Ambulatory Visit: Payer: Medicare HMO | Admitting: Cardiology

## 2018-01-06 DIAGNOSIS — R0989 Other specified symptoms and signs involving the circulatory and respiratory systems: Secondary | ICD-10-CM

## 2018-01-07 ENCOUNTER — Encounter: Payer: Self-pay | Admitting: Cardiology

## 2018-01-21 DIAGNOSIS — I5032 Chronic diastolic (congestive) heart failure: Secondary | ICD-10-CM | POA: Diagnosis not present

## 2018-01-26 ENCOUNTER — Ambulatory Visit
Admission: RE | Admit: 2018-01-26 | Discharge: 2018-01-26 | Disposition: A | Discharge status: Home / Self Care | Payer: Medicare HMO | Source: Ambulatory Visit | Attending: Cardiology | Admitting: Cardiology

## 2018-01-26 ENCOUNTER — Ambulatory Visit: Payer: Medicare HMO | Admitting: Cardiology

## 2018-01-26 VITALS — BP 132/66 | HR 72 | Ht 64.0 in | Wt 208.0 lb

## 2018-01-26 DIAGNOSIS — R0602 Shortness of breath: Secondary | ICD-10-CM

## 2018-01-26 DIAGNOSIS — G4733 Obstructive sleep apnea (adult) (pediatric): Secondary | ICD-10-CM

## 2018-01-26 DIAGNOSIS — I10 Essential (primary) hypertension: Secondary | ICD-10-CM

## 2018-01-26 DIAGNOSIS — I2510 Atherosclerotic heart disease of native coronary artery without angina pectoris: Secondary | ICD-10-CM | POA: Diagnosis not present

## 2018-01-26 DIAGNOSIS — I5032 Chronic diastolic (congestive) heart failure: Secondary | ICD-10-CM | POA: Diagnosis not present

## 2018-01-26 DIAGNOSIS — E785 Hyperlipidemia, unspecified: Secondary | ICD-10-CM | POA: Diagnosis not present

## 2018-01-26 DIAGNOSIS — R05 Cough: Secondary | ICD-10-CM | POA: Diagnosis not present

## 2018-01-26 LAB — CBC
HEMATOCRIT: 41.4 % (ref 37.5–51.0)
HEMOGLOBIN: 13.8 g/dL (ref 13.0–17.7)
MCH: 29.3 pg (ref 26.6–33.0)
MCHC: 33.3 g/dL (ref 31.5–35.7)
MCV: 88 fL (ref 79–97)
Platelets: 166 10*3/uL (ref 150–379)
RBC: 4.71 x10E6/uL (ref 4.14–5.80)
RDW: 14.1 % (ref 12.3–15.4)
WBC: 7.5 10*3/uL (ref 3.4–10.8)

## 2018-01-26 LAB — BASIC METABOLIC PANEL
BUN/Creatinine Ratio: 14 (ref 10–24)
BUN: 14 mg/dL (ref 8–27)
CALCIUM: 9.6 mg/dL (ref 8.6–10.2)
CO2: 27 mmol/L (ref 20–29)
CREATININE: 1.02 mg/dL (ref 0.76–1.27)
Chloride: 100 mmol/L (ref 96–106)
GFR calc Af Amer: 77 mL/min/{1.73_m2} (ref >59)
GFR, EST NON AFRICAN AMERICAN: 67 mL/min/{1.73_m2} (ref >59)
Glucose: 124 mg/dL — ABNORMAL HIGH (ref 65–99)
Potassium: 4.4 mmol/L (ref 3.5–5.2)
SODIUM: 141 mmol/L (ref 134–144)

## 2018-01-26 LAB — PRO B NATRIURETIC PEPTIDE: NT-Pro BNP: 103 pg/mL (ref 0–486)

## 2018-01-26 LAB — TSH: TSH: 2.49 u[IU]/mL (ref 0.450–4.500)

## 2018-01-26 NOTE — Patient Instructions (Addendum)
Medication Instructions:  Your physician has recommended you make the following change in your medication:  INCREASE: lasix to 40 mg (1 tablet) two times a day for 3 days and then take Lasix 40 mg (1 tablet) daily   If you need a refill on your cardiac medications, please contact your pharmacy first.  Labwork: Today for kidney function test, complete blood count, thyroid, and BNP  Testing/Procedures: A chest x-ray takes a picture of the organs and structures inside the chest, including the heart, lungs, and blood vessels. This test can show several things, including, whether the heart is enlarges; whether fluid is building up in the lungs; and whether pacemaker / defibrillator leads are still in place.  Follow-Up: Your physician recommends that you schedule a follow-up appointment in: 1 week with PA per Dr. Radford Pax   Your physician wants you to follow-up in: 6 months with Dr. Radford Pax. You will receive a reminder letter in the mail two months in advance. If you don't receive a letter, please call our office to schedule the follow-up appointment.  You have been referred to Dr. Chase Caller, Pulmonologist   Any Other Special Instructions Will Be Listed Below (If Applicable).   Thank you for choosing [facility], RN  [phone]  If you need a refill on your cardiac medications before your next appointment, please call your pharmacy.

## 2018-01-26 NOTE — Progress Notes (Signed)
Cardiology Office Note:    Date:  01/26/2018   ID:  [name], DOB [birth year], MRN [id number]  PCP:  [name], MD  Cardiologist:  No primary care provider on file.    Referring MD: [name], MD   Chief Complaint  Patient presents with  . Coronary Artery Disease  . Hypertension  . Hyperlipidemia  . Congestive Heart Failure  . Sleep Apnea    History of Present Illness:    [name] is a 84 y.o. male with a hx of ASCAD (s/p PCI of RCA 03/2009 with residual 70% ramus and 90% RCA, cath 01/2011 showed patent stents in RCA with moderate prox disesae, aneurysmal left circ and small 90% ramus s/p PCI), HTN, dyslipidemia, chronic diastolic CHF, obesity and OSA on CPAP.  He was hospitalized in February with COPD exacerbation secondary to influenza A complicated by acute on chronic diastolic heart failure which cleared with IV Lasix.  He is here today for followup and is doing well.  Since coming out of the hospital in February, his breathing has worsened and he saw his PCP and was supposed to be set up on O2 during the day in addition to at night with his CPAP but apparently this was never ordered. He continues to have LE edema which he thinks has gotten worse.  He follows a low sodium diet.  He denies any chest pain or pressure, dizziness, palpitations or syncope. He says that occasionally he will have to get out of bed and sleep in a recliner to breath otherwise he sleeps on a wedge and a pillow.  He cannot walk into another room without getting more SOB.  He is compliant with his meds and is tolerating meds with no SE.  He is doing well with his CPAP device.  He tolerates the mask and feels the pressure is adequate.  For the most part he feels rested in the am with no significant daytime sleepiness.  He has some mouth but not too bothersome.   He has a cough and has been wheezing some.   Past Medical History:  Diagnosis Date  . Anxiety   . Asthma   . Chronic diastolic CHF  (congestive heart failure) (HCC)    diastolic   . COPD (chronic obstructive pulmonary disease) ([place])   . Coronary artery disease    s/p PCI of RCA 03/2009 at which time there was 70% ramus and 90% RCA, cath 01/2011 showed patent stents in RCA with moderate prox disesae, aneurysmal left circ and small 90% ramus s/p PCI, patent LAD EF 55%  . Diabetes mellitus   . Diverticulitis Oct. 2013   bleeding in the past and Effient for his CAD was stopped  . DVT (deep venous thrombosis) ([place])   . GERD (gastroesophageal reflux disease)   . Hypercholesterolemia   . Hypertension   . Hypothyroidism   . Obesity (BMI 30-39.9)   . OSA (obstructive sleep apnea)    severe on CPAP  . Peripheral vascular disease ([place]) 11/2006   s/p left stent  . Shortness of breath   . Sleep apnea    severe OSA awaiting CPAP titration    Past Surgical History:  Procedure Laterality Date  . ABDOMINAL AORTAGRAM N/A 04/20/2012   Procedure: ABDOMINAL Maxcine Ham;  Surgeon: Angelia Mould, MD;  Location: [facility] CATH LAB;  Service: Cardiovascular;  Laterality: N/A;  . ABDOMINAL AORTAGRAM N/A 12/29/2012   Procedure: ABDOMINAL Maxcine Ham;  Surgeon: Serafina Mitchell, MD;  Location: [facility]  CATH LAB;  Service: Cardiovascular;  Laterality: N/A;  . BALLOON DILATION  12/26/2011   Procedure: BALLOON DILATION;  Surgeon: Garlan Fair, MD;  Location: [facility] ENDOSCOPY;  Service: Endoscopy;  Laterality: N/A;  . COLONOSCOPY  08/14/2012   Procedure: COLONOSCOPY;  Surgeon: Arta Silence, MD;  Location: [facility] ENDOSCOPY;  Service: Endoscopy;  Laterality: N/A;  . CORONARY STENT PLACEMENT  2010 and 2012  . ESOPHAGOGASTRODUODENOSCOPY  12/26/2011   Procedure: ESOPHAGOGASTRODUODENOSCOPY (EGD);  Surgeon: Garlan Fair, MD;  Location: [facility] ENDOSCOPY;  Service: Endoscopy;  Laterality: N/A;  . ESOPHAGOGASTRODUODENOSCOPY  08/13/2012   Procedure: ESOPHAGOGASTRODUODENOSCOPY (EGD);  Surgeon: Arta Silence, MD;  Location: [facility] ENDOSCOPY;  Service: Endoscopy;  Laterality:  Left;  . HERNIA REPAIR  1992   evntral hernia repair  . LOWER EXTREMITY ANGIOGRAM Bilateral 12/29/2012   Procedure: LOWER EXTREMITY ANGIOGRAM;  Surgeon: Serafina Mitchell, MD;  Location[facility] CATH LAB;  Service: Cardiovascular;  Laterality: Bilateral;  bilat lower extrem angio    Current Medications: Current Meds  Medication Sig  . albuterol (PROVENTIL HFA;VENTOLIN HFA) 108 (90 BASE) MCG/ACT inhaler Inhale 2 puffs into the lungs every 6 (six) hours as needed for wheezing or shortness of breath.   Marland Kitchen albuterol (PROVENTIL) (2.5 MG/3ML) 0.083% nebulizer solution Take 3 mLs (2.5 mg total) by nebulization every 4 (four) hours as needed for wheezing or shortness of breath.  . alfuzosin (UROXATRAL) 10 MG 24 hr tablet Take 10 mg by mouth daily. At 5 pm  . aspirin EC 81 MG tablet Take 81 mg by mouth daily. At 5 pm  . budesonide-formoterol (SYMBICORT) 160-4.5 MCG/ACT inhaler Inhale 2 puffs into the lungs 2 (two) times daily.  . clobetasol cream (TEMOVATE) 0.05 % APPLY TO AFFECTED AREA OF ARAMS/LEGS UP TO 2 TIMES DAILY AS NEEDED *NOT TO FACE,GROIN,OR UNDERARMS  . furosemide (LASIX) 40 MG tablet Take 1 tablet (40 mg total) by mouth daily.  Marland Kitchen glimepiride (AMARYL) 2 MG tablet Take 2 mg by mouth daily. At 5 pm  . isosorbide mononitrate (IMDUR) 60 MG 24 hr tablet Take 60 mg by mouth daily. At 5 pm  . levothyroxine (SYNTHROID, LEVOTHROID) 150 MCG tablet Take 150 mcg by mouth daily before breakfast.  . loperamide (IMODIUM) 2 MG capsule Take 2 mg by mouth as needed for diarrhea or loose stools (Take as directed).   . meloxicam (MOBIC) 15 MG tablet Take 15 mg by mouth daily.  . metoprolol succinate (TOPROL-XL) 50 MG 24 hr tablet TAKE 1 TABLET BY MOUTH EVERY DAY  . nitroGLYCERIN (NITROSTAT) 0.4 MG SL tablet Place 0.4 mg under the tongue every 5 (five) minutes as needed for chest pain.  . pantoprazole (PROTONIX) 40 MG tablet TAKE 1 TABLET (40 MG TOTAL) BY MOUTH 2 (TWO) TIMES DAILY.  Marland Kitchen triamcinolone cream (KENALOG) 0.1  % APPLY TO AFFECTED AREA UP TO TWICE A DAY AS NEEDED NOT TO FACE/GROIN/UNDERARMS  . [DISCONTINUED] predniSONE (DELTASONE) 5 MG tablet Take 1 tablet (5 mg total) by mouth daily with breakfast.     Allergies:   Clopidogrel   Social History   Socioeconomic History  . Marital status: Widowed    Spouse name: Not on file  . Number of children: Not on file  . Years of education: Not on file  . Highest education level: Not on file  Occupational History  . Not on file  Social Needs  . Financial resource strain: Not on file  . Food insecurity:    Worry: Not on file    Inability:  Not on file  . Transportation needs:    Medical: Not on file    Non-medical: Not on file  Tobacco Use  . Smoking status: Former Smoker    Packs/day: 0.50    Years: 15.00    Pack years: 7.50    Types: Cigarettes, Cigars    Last attempt to quit: 10/15/1963    Years since quitting: 54.3  . Smokeless tobacco: Never Used  Substance and Sexual Activity  . Alcohol use: No  . Drug use: No  . Sexual activity: Never  Lifestyle  . Physical activity:    Days per week: Not on file    Minutes per session: Not on file  . Stress: Not on file  Relationships  . Social connections:    Talks on phone: Not on file    Gets together: Not on file    Attends religious service: Not on file    Active member of club or organization: Not on file    Attends meetings of clubs or organizations: Not on file    Relationship status: Not on file  Other Topics Concern  . Not on file  Social History Narrative  . Not on file     Family History: The patient's family history includes Asthma in his sister; Breast cancer in his sister; Diabetes in his brother, mother, and sister; Heart attack in his mother and sister; Heart disease in his father, mother, and sister; Hyperlipidemia in his mother, sister, and sister; Hypertension in his mother, sister, and sister. There is no history of Malignant hyperthermia.  ROS:   Please see the  history of present illness.    ROS  All other systems reviewed and negative.   EKGs/Labs/Other Studies Reviewed:    The following studies were reviewed today: none  EKG:  EKG is not ordered today.   Recent Labs: 11/13/2017: ALT 51; B Natriuretic Peptide 88.2 11/14/2017: BUN 27; Creatinine, Ser 1.38; Hemoglobin 13.5; Platelets 129; Potassium 4.8; Sodium 138   Recent Lipid Panel    Component Value Date/Time   CHOL 101 10/23/2017 0838   TRIG 58 10/23/2017 0838   HDL 42 10/23/2017 0838   CHOLHDL 2.4 10/23/2017 0838   CHOLHDL 4 11/16/2014 1124   VLDL 19.8 11/16/2014 1124   LDLCALC 47 10/23/2017 0838    Physical Exam:    VS:  BP 132/66   Pulse 72   Ht 5\' 4"  (1.626 m)   Wt 208 lb (94.3 kg)   BMI 35.70 kg/m     Wt Readings from Last 3 Encounters:  01/26/18 208 lb (94.3 kg)  11/13/17 201 lb 11.5 oz (91.5 kg)  06/30/17 209 lb 3.2 oz (94.9 kg)     GEN:  Well nourished, well developed in no acute distress HEENT: Normal NECK: No JVD; No carotid bruits LYMPHATICS: No lymphadenopathy CARDIAC: RRR, no murmurs, rubs, gallops RESPIRATORY:  Scattered expiratory wheezes ABDOMEN: Soft, non-tender, non-distended MUSCULOSKELETAL:  2+ LE edema; No deformity  SKIN: Warm and dry NEUROLOGIC:  Alert and oriented x 3 PSYCHIATRIC:  Normal affect   ASSESSMENT:    1. Coronary artery disease involving native coronary artery of native heart without angina pectoris   2. HYPERTENSION, BENIGN   3. Chronic diastolic CHF (congestive heart failure) ([place])   4. OSA (obstructive sleep apnea)   5. Dyslipidemia    PLAN:    In order of problems listed above:  1.  ASCAD - s/p PCI of RCA 03/2009 with residual 70% ramus and 90% RCA.  Cath  01/2011 showed patent stents in RCA with moderate prox disesae, aneurysmal left circ and small 90% ramus s/p PCI.  He denies any anginal symptoms.  He will continue on aspirin 81 mg daily, Imdur 60 mg daily, Toprol-XL 50 mg daily and statin therapy.  2.  HTN -blood  pressures well controlled on exam today.  He will continue on Toprol-XL 50 mg daily.  3.  Chronic diastolic CHF -he appears volume overloaded on exam today even though his weight is actually down 10 pounds from this time last year.  He he is wheezing on exam today and I cannot tell whether this is due to his COPD or due to volume overload.  He has 2+ lower extremity edema and his shortness of breath has gotten worse.  I am going to check CBC, TSH, BNP, bmet today.  I will get a PA and lateral chest x-ray.  I have encouraged him to call his primary about the oxygen they were supposed to order during the day.  I am going to refer him to Dr. Chase Caller with pulmonary for further evaluation of his COPD.  I am going to increase his Lasix to 40 mg twice daily for 3 days and 40 mg daily and have him follow-up with our physician assistant in 1 week.  4.  OSA -he is on CPAP as well as O2 at home and tolerating well.  5.  Hyperlipidemia with LDL goal less than 70.  His LDL was at goal at 47 on 10/23/2017.  He will continue on Zetia 10 mg daily and Crestor 40 mg daily.   Medication Adjustments/Labs and Tests Ordered: Current medicines are reviewed at length with the patient today.  Concerns regarding medicines are outlined above.  No orders of the defined types were placed in this encounter.  No orders of the defined types were placed in this encounter.   Signed, Fransico Him, MD  01/26/2018 9:20 AM    [place]

## 2018-01-28 ENCOUNTER — Telehealth: Payer: Self-pay

## 2018-01-28 DIAGNOSIS — I2510 Atherosclerotic heart disease of native coronary artery without angina pectoris: Secondary | ICD-10-CM | POA: Diagnosis not present

## 2018-01-28 DIAGNOSIS — I110 Hypertensive heart disease with heart failure: Secondary | ICD-10-CM | POA: Diagnosis not present

## 2018-01-28 DIAGNOSIS — Z6837 Body mass index (BMI) 37.0-37.9, adult: Secondary | ICD-10-CM | POA: Diagnosis not present

## 2018-01-28 DIAGNOSIS — E039 Hypothyroidism, unspecified: Secondary | ICD-10-CM | POA: Diagnosis not present

## 2018-01-28 DIAGNOSIS — E1159 Type 2 diabetes mellitus with other circulatory complications: Secondary | ICD-10-CM | POA: Diagnosis not present

## 2018-01-28 DIAGNOSIS — I739 Peripheral vascular disease, unspecified: Secondary | ICD-10-CM | POA: Diagnosis not present

## 2018-01-28 DIAGNOSIS — J449 Chronic obstructive pulmonary disease, unspecified: Secondary | ICD-10-CM | POA: Diagnosis not present

## 2018-01-28 DIAGNOSIS — I5030 Unspecified diastolic (congestive) heart failure: Secondary | ICD-10-CM | POA: Diagnosis not present

## 2018-01-28 DIAGNOSIS — E782 Mixed hyperlipidemia: Secondary | ICD-10-CM | POA: Diagnosis not present

## 2018-01-28 NOTE — Telephone Encounter (Signed)
I called patient to go over lab results. Patient requested I speak with his daughter [name]. I called [name] left message to call back.   Patient is scheduled with Dr. Chase Caller, pulmonology on 02/19/18 at 4:15PM

## 2018-01-29 MED ORDER — FUROSEMIDE 20 MG PO TABS: 20.0000 mg | ORAL_TABLET | Freq: Every day | ORAL | 3 refills | Status: DC

## 2018-01-29 NOTE — Telephone Encounter (Signed)
I spoke with patient's daughter per patient request regarding lab results and Dr. Theodosia Blender recommendation to DECREASE lasix to 20 mg once a day and to follow up with pulmonary due to worsening COPD. I informed her that patient as a appt with Dr. Chase Caller on 02/19/18 at 4:15PM. She verbalized understanding and thankful for the call.

## 2018-02-02 ENCOUNTER — Ambulatory Visit: Payer: Medicare HMO | Admitting: Cardiology

## 2018-02-02 NOTE — Progress Notes (Deleted)
Cardiology Office Note:    Date:  02/02/2018   ID:  [name], DOB October 18, 1929, MRN [id number]  PCP:  [name], MD  Cardiologist:  Fransico Him, MD  Referring MD: [name], MD   No chief complaint on file. ***  History of Present Illness:    [name] is a 84 y.o. male with a past medical history significant for CAD (s/p PCI of RCA 2010 with residual 70% ramus and 90% RCA, cath 01/2011 showed patent stents in RCA with moderate prox disesae, aneurysmal left circ and small 90% ramus s/p PCI), HTN, dyslipidemia, chronic diastolic CHF, obesity and OSA on CPAP.  He was hospitalized in February with COPD exacerbation secondary to influenza A complicated by acute chronic diastolic heart failure which cleared with IV Lasix.  [name] was seen by Dr. Radford Pax on 01/26/18 at which time he appeared volume overloaded on exam although his weight was actually down 10 pounds since this time last year.  He was wheezing and it was not clear to Dr. Radford Pax whether this was related to COPD or volume overload.  He had 2+ lower extremity edema and his of breath had gotten worse.  Dr. Radford Pax prescribed an increase in his Lasix however when his labs and chest x-ray were checked but they did not reveal evidence of volume overload.  Lasix dosing was reduced back to his daily 20 mg and he was advised to follow-up with pulmonary.  [name] is her today for follow up     Chronic diastolic CHF  CAD He will continue on aspirin 81 mg daily, Imdur 60 mg daily, Toprol 50 mg daily and statin  Hypertension Toprol-XL 50 mg daily  OSA on CPAP  Past Medical History:  Diagnosis Date  . Anxiety   . Asthma   . Chronic diastolic CHF (congestive heart failure) (HCC)    diastolic   . COPD (chronic obstructive pulmonary disease) ([place])   . Coronary artery disease    s/p PCI of RCA 03/2009 at which time there was 70% ramus and 90% RCA, cath 01/2011 showed patent stents in RCA with moderate prox  disesae, aneurysmal left circ and small 90% ramus s/p PCI, patent LAD EF 55%  . Diabetes mellitus   . Diverticulitis Oct. 2013   bleeding in the past and Effient for his CAD was stopped  . DVT (deep venous thrombosis) ([place])   . GERD (gastroesophageal reflux disease)   . Hypercholesterolemia   . Hypertension   . Hypothyroidism   . Obesity (BMI 30-39.9)   . OSA (obstructive sleep apnea)    severe on CPAP  . Peripheral vascular disease ([place]) 11/2006   s/p left stent  . Shortness of breath   . Sleep apnea    severe OSA awaiting CPAP titration    Past Surgical History:  Procedure Laterality Date  . ABDOMINAL AORTAGRAM N/A 04/20/2012   Procedure: ABDOMINAL Maxcine Ham;  Surgeon: Angelia Mould, MD;  Location: [facility] CATH LAB;  Service: Cardiovascular;  Laterality: N/A;  . ABDOMINAL AORTAGRAM N/A 12/29/2012   Procedure: ABDOMINAL Maxcine Ham;  Surgeon: Serafina Mitchell, MD;  Location: [facility] CATH LAB;  Service: Cardiovascular;  Laterality: N/A;  . BALLOON DILATION  12/26/2011   Procedure: BALLOON DILATION;  Surgeon: Garlan Fair, MD;  Location: [facility] ENDOSCOPY;  Service: Endoscopy;  Laterality: N/A;  . COLONOSCOPY  08/14/2012   Procedure: COLONOSCOPY;  Surgeon: Arta Silence, MD;  Location: [facility] ENDOSCOPY;  Service: Endoscopy;  Laterality: N/A;  . CORONARY  STENT PLACEMENT  2010 and 2012  . ESOPHAGOGASTRODUODENOSCOPY  12/26/2011   Procedure: ESOPHAGOGASTRODUODENOSCOPY (EGD);  Surgeon: Garlan Fair, MD;  Location: [facility] ENDOSCOPY;  Service: Endoscopy;  Laterality: N/A;  . ESOPHAGOGASTRODUODENOSCOPY  08/13/2012   Procedure: ESOPHAGOGASTRODUODENOSCOPY (EGD);  Surgeon: Arta Silence, MD;  Location: [facility] ENDOSCOPY;  Service: Endoscopy;  Laterality: Left;  . HERNIA REPAIR  1992   evntral hernia repair  . LOWER EXTREMITY ANGIOGRAM Bilateral 12/29/2012   Procedure: LOWER EXTREMITY ANGIOGRAM;  Surgeon: Serafina Mitchell, MD;  Location: [facility] CATH LAB;  Service: Cardiovascular;  Laterality: Bilateral;  bilat lower  extrem angio    Current Medications: No outpatient medications have been marked as taking for the 02/02/18 encounter (Appointment) with Daune Perch, NP.     Allergies:   Clopidogrel   Social History   Socioeconomic History  . Marital status: Widowed    Spouse name: Not on file  . Number of children: Not on file  . Years of education: Not on file  . Highest education level: Not on file  Occupational History  . Not on file  Social Needs  . Financial resource strain: Not on file  . Food insecurity:    Worry: Not on file    Inability: Not on file  . Transportation needs:    Medical: Not on file    Non-medical: Not on file  Tobacco Use  . Smoking status: Former Smoker    Packs/day: 0.50    Years: 15.00    Pack years: 7.50    Types: Cigarettes, Cigars    Last attempt to quit: 10/15/1963    Years since quitting: 54.3  . Smokeless tobacco: Never Used  Substance and Sexual Activity  . Alcohol use: No  . Drug use: No  . Sexual activity: Never  Lifestyle  . Physical activity:    Days per week: Not on file    Minutes per session: Not on file  . Stress: Not on file  Relationships  . Social connections:    Talks on phone: Not on file    Gets together: Not on file    Attends religious service: Not on file    Active member of club or organization: Not on file    Attends meetings of clubs or organizations: Not on file    Relationship status: Not on file  Other Topics Concern  . Not on file  Social History Narrative  . Not on file     Family History: The patient's ***family history includes Asthma in his sister; Breast cancer in his sister; Diabetes in his brother, mother, and sister; Heart attack in his mother and sister; Heart disease in his father, mother, and sister; Hyperlipidemia in his mother, sister, and sister; Hypertension in his mother, sister, and sister. There is no history of Malignant hyperthermia. ROS:   Please see the history of present illness.    ***  All other systems reviewed and are negative.  EKGs/Labs/Other Studies Reviewed:    The following studies were reviewed today:  Lexiscan Myoview 12/13/16 Study Highlights    Nuclear stress EF: 50%.  There was no ST segment deviation noted during stress.  Defect 1: There is a medium defect of moderate severity present in the basal inferior and mid inferior location.  Defect 2: There is a small defect of severe severity present in the apex location.  This is a low risk study.  The left ventricular ejection fraction is mildly decreased (45-54%).   Low risk stress nuclear study with  inferior and apical thinning vs small prior infarct; no ischemia; EF 50 with mild global hypokinesis and mild LVE.     Echocardiogram 09/23/16 Study Conclusions - Left ventricle: The cavity size was normal. Wall thickness was   increased in a pattern of mild LVH. Systolic function was normal.   The estimated ejection fraction was in the range of 60% to 65%.   Wall motion was normal; there were no regional wall motion   abnormalities. Doppler parameters are consistent with abnormal   left ventricular relaxation (grade 1 diastolic dysfunction). The   E/e&' ratio is between 8-15, suggesting indeterminate LV filling   pressure. - Aortic valve: Poorly visualized. Sclerosis without stenosis. - Left atrium: The atrium was normal in size. - Tricuspid valve: There was trivial regurgitation. - Pulmonary arteries: PA peak pressure: 30 mm Hg (S). - Inferior vena cava: The vessel was normal in size. The   respirophasic diameter changes were in the normal range (>= 50%),   consistent with normal central venous pressure.  Impressions: - Compared with a prior study in 2016, there are no significant   changes.   EKG:  EKG is *** ordered today.  The ekg ordered today demonstrates ***  Recent Labs: 11/13/2017: ALT 51; B Natriuretic Peptide 88.2 01/26/2018: BUN 14; Creatinine, Ser 1.02; Hemoglobin 13.8; NT-Pro  BNP 103; Platelets 166; Potassium 4.4; Sodium 141; TSH 2.490   Recent Lipid Panel    Component Value Date/Time   CHOL 101 10/23/2017 0838   TRIG 58 10/23/2017 0838   HDL 42 10/23/2017 0838   CHOLHDL 2.4 10/23/2017 0838   CHOLHDL 4 11/16/2014 1124   VLDL 19.8 11/16/2014 1124   LDLCALC 47 10/23/2017 0838    Physical Exam:    VS:  There were no vitals taken for this visit.    Wt Readings from Last 3 Encounters:  01/26/18 208 lb (94.3 kg)  11/13/17 201 lb 11.5 oz (91.5 kg)  06/30/17 209 lb 3.2 oz (94.9 kg)     Physical Exam***   ASSESSMENT:    No diagnosis found. PLAN:    In order of problems listed above:  1. ***   Medication Adjustments/Labs and Tests Ordered: Current medicines are reviewed at length with the patient today.  Concerns regarding medicines are outlined above. Labs and tests ordered and medication changes are outlined in the patient instructions below:  There are no Patient Instructions on file for this visit.   Signed, Daune Perch, NP  02/02/2018 4:26 AM    [place]

## 2018-02-09 ENCOUNTER — Other Ambulatory Visit: Payer: Self-pay | Admitting: Cardiology

## 2018-02-11 NOTE — Telephone Encounter (Signed)
5.  Hyperlipidemia with LDL goal less than 70.  His LDL was at goal at 47 on 10/23/2017.  He will continue on Zetia 10 mg daily and Crestor 40 mg daily.

## 2018-02-15 DIAGNOSIS — G4733 Obstructive sleep apnea (adult) (pediatric): Secondary | ICD-10-CM | POA: Diagnosis not present

## 2018-02-19 ENCOUNTER — Institutional Professional Consult (permissible substitution): Payer: Medicare HMO | Admitting: Internal Medicine

## 2018-02-20 DIAGNOSIS — I5032 Chronic diastolic (congestive) heart failure: Secondary | ICD-10-CM | POA: Diagnosis not present

## 2018-02-27 ENCOUNTER — Ambulatory Visit: Payer: Medicare HMO | Admitting: Cardiology

## 2018-03-19 ENCOUNTER — Institutional Professional Consult (permissible substitution): Payer: Medicare HMO | Admitting: Internal Medicine

## 2018-03-20 ENCOUNTER — Ambulatory Visit: Payer: Medicare HMO | Admitting: Cardiology

## 2018-03-20 ENCOUNTER — Encounter: Payer: Self-pay | Admitting: Cardiology

## 2018-03-20 VITALS — BP 138/76 | HR 93 | Ht 64.0 in | Wt 205.0 lb

## 2018-03-20 DIAGNOSIS — G4733 Obstructive sleep apnea (adult) (pediatric): Secondary | ICD-10-CM | POA: Diagnosis not present

## 2018-03-20 DIAGNOSIS — E785 Hyperlipidemia, unspecified: Secondary | ICD-10-CM

## 2018-03-20 DIAGNOSIS — J449 Chronic obstructive pulmonary disease, unspecified: Secondary | ICD-10-CM | POA: Diagnosis not present

## 2018-03-20 DIAGNOSIS — I10 Essential (primary) hypertension: Secondary | ICD-10-CM

## 2018-03-20 DIAGNOSIS — I5032 Chronic diastolic (congestive) heart failure: Secondary | ICD-10-CM | POA: Diagnosis not present

## 2018-03-20 DIAGNOSIS — I2510 Atherosclerotic heart disease of native coronary artery without angina pectoris: Secondary | ICD-10-CM | POA: Diagnosis not present

## 2018-03-20 NOTE — Patient Instructions (Addendum)
Medication Instructions: Your physician has recommended you make the following change in your medication:  STOP:Isosorbide  Continue all other medications    Labwork: None Ordered  Procedures/Testing: None Ordered  Follow-Up: Your physician recommends that you schedule a follow-up appointment in: 3-4 months with Dr.Turner    Any Additional Special Instructions Will Be Listed Below (If Applicable).   Chronic Venous Insufficiency Chronic venous insufficiency, also called venous stasis, is a condition that prevents blood from being pumped effectively through the veins in your legs. Blood may no longer be pumped effectively from the legs back to the heart. This condition can range from mild to severe. With proper treatment, you should be able to continue with an active life. What are the causes? Chronic venous insufficiency occurs when the vein walls become stretched, weakened, or damaged, or when valves within the vein are damaged. Some common causes of this include:  High blood pressure inside the veins (venous hypertension).  Increased blood pressure in the leg veins from long periods of sitting or standing.  A blood clot that blocks blood flow in a vein (deep vein thrombosis, DVT).  Inflammation of a vein (phlebitis) that causes a blood clot to form.  Tumors in the pelvis that cause blood to back up.  What increases the risk? The following factors may make you more likely to develop this condition:  Having a family history of this condition.  Obesity.  Pregnancy.  Living without enough physical activity or exercise (sedentary lifestyle).  Smoking.  Having a job that requires long periods of standing or sitting in one place.  Being a certain age. Women in their 34s and 67s and men in their 9s are more likely to develop this condition.  What are the signs or symptoms? Symptoms of this condition include:  Veins that are enlarged, bulging, or twisted (varicose  veins).  Skin breakdown or ulcers.  Reddened or discolored skin on the front of the leg.  Brown, smooth, tight, and painful skin just above the ankle, usually on the inside of the leg (lipodermatosclerosis).  Swelling.  How is this diagnosed? This condition may be diagnosed based on:  Your medical history.  A physical exam.  Tests, such as: ? A procedure that creates an image of a blood vessel and nearby organs and provides information about blood flow through the blood vessel (duplex ultrasound). ? A procedure that tests blood flow (plethysmography). ? A procedure to look at the veins using X-ray and dye (venogram).  How is this treated? The goals of treatment are to help you return to an active life and to minimize pain or disability. Treatment depends on the severity of your condition, and it may include:  Wearing compression stockings. These can help relieve symptoms and help prevent your condition from getting worse. However, they do not cure the condition.  Sclerotherapy. This is a procedure involving an injection of a material that "dissolves" damaged veins.  Surgery. This may involve: ? Removing a diseased vein (vein stripping). ? Cutting off blood flow through the vein (laser ablation surgery). ? Repairing a valve.  Follow these instructions at home:  Wear compression stockings as told by your health care provider. These stockings help to prevent blood clots and reduce swelling in your legs.  Take over-the-counter and prescription medicines only as told by your health care provider.  Stay active by exercising, walking, or doing different activities. Ask your health care provider what activities are safe for you and how much exercise you  need.  Drink enough fluid to keep your urine clear or pale yellow.  Do not use any products that contain nicotine or tobacco, such as cigarettes and e-cigarettes. If you need help quitting, ask your health care provider.  Keep  all follow-up visits as told by your health care provider. This is important. Contact a health care provider if:  You have redness, swelling, or more pain in the affected area.  You see a red streak or line that extends up or down from the affected area.  You have skin breakdown or a loss of skin in the affected area, even if the breakdown is small.  You get an injury in the affected area. Get help right away if:  You get an injury and an open wound in the affected area.  You have severe pain that does not get better with medicine.  You have sudden numbness or weakness in the foot or ankle below the affected area, or you have trouble moving your foot or ankle.  You have a fever and you have worse or persistent symptoms.  You have chest pain.  You have shortness of breath. Summary  Chronic venous insufficiency, also called venous stasis, is a condition that prevents blood from being pumped effectively through the veins in your legs.  Chronic venous insufficiency occurs when the vein walls become stretched, weakened, or damaged, or when valves within the vein are damaged.  Treatment for this condition depends on how severe your condition is, and it may involve wearing compression stockings or having a procedure.  Make sure you stay active by exercising, walking, or doing different activities. Ask your health care provider what activities are safe for you and how much exercise you need. This information is not intended to replace advice given to you by your health care provider. Make sure you discuss any questions you have with your health care provider. Document Released: 02/03/2007 Document Revised: 08/19/2016 Document Reviewed: 08/19/2016 Elsevier Interactive Patient Education  2017 [place].  How to Use Compression Stockings Compression stockings are elastic socks that squeeze the legs. They help to increase blood flow to the legs, decrease swelling in the legs, and reduce  the chance of developing blood clots in the lower legs. Compression stockings are often used by people who:  Are recovering from surgery.  Have poor circulation in their legs.  Are prone to getting blood clots in their legs.  Have varicose veins.  Sit or stay in bed for long periods of time.  How to use compression stockings Before you put on your compression stockings:  Make sure that they are the correct size. If you do not know your size, ask your health care provider.  Make sure that they are clean, dry, and in good condition.  Check them for rips and tears. Do not put them on if they are ripped or torn.  Put your stockings on first thing in the morning, before you get out of bed. Keep them on for as long as your health care provider advises. When you are wearing your stockings:  Keep them as smooth as possible. Do not allow them to bunch up. It is especially important to prevent the stockings from bunching up around your toes or behind your knees.  Do not roll the stockings downward and leave them rolled down. This can decrease blood flow to your leg.  Change them right away if they become wet or dirty.  When you take off your stockings, inspect your  legs and feet. Anything that does not seem normal may require medical attention. Look for:  Open sores.  Red spots.  Swelling.  Information and tips  Do not stop wearing your compression stockings without talking to your health care provider first.  Wash your stockings every day with mild detergent in cold or warm water. Do not use bleach. Air-dry your stockings or dry them in a clothes dryer on low heat.  Replace your stockings every 3-6 months.  If skin moisturizing is part of your treatment plan, apply lotion or cream at night so that your skin will be dry when you put on the stockings in the morning. It is harder to put the stockings on when you have lotion on your legs or feet. Contact a health care provider  if: Remove your stockings and seek medical care if:  You have a feeling of pins and needles in your feet or legs.  You have any new changes in your skin.  You have skin lesions that are getting worse.  You have swelling or pain that is getting worse.  Get help right away if:  You have numbness or tingling in your lower legs that does not get better right after you take the stockings off.  Your toes or feet become cold and blue.  You develop open sores or red spots on your legs that do not go away.  You see or feel a warm spot on your leg.  You have new swelling or soreness in your leg.  You are short of breath or you have chest pain for no reason.  You have a rapid or irregular heartbeat.  You feel light-headed or dizzy. This information is not intended to replace advice given to you by your health care provider. Make sure you discuss any questions you have with your health care provider. Document Released: 07/28/2009 Document Revised: 02/28/2016 Document Reviewed: 09/07/2014 Elsevier Interactive Patient Education  [employer].    If you need a refill on your cardiac medications before your next appointment, please call your pharmacy.

## 2018-03-20 NOTE — Progress Notes (Signed)
Cardiology Office Note:    Date:  03/20/2018   ID:  [name], DOB April 14, 1930, MRN [id number]  PCP:  Eddie Neer, MD  Cardiologist:  Eddie Him, MD  Referring MD: Eddie Neer, MD   Chief Complaint  Patient presents with  . Follow-up    shortness of breath  . Coronary Artery Disease  . Congestive Heart Failure    History of Present Illness:    [name] is a 84 y.o. male with a past medical history significant for CAD (S/P PCI of RCA 03/2009 with residual 70% ramus and 90% RCA, cath 01/2011 showed patent stents in RCA with moderate proximal disease, aneurysmal left circumflex and small 90% ramus status post PCI), hypertension, dyslipidemia, chronic diastolic CHF, obesity and OSA on CPAP.  Patient was hospitalized in February/2019 for COPD exacerbation secondary to influenza A complicated by acute on chronic diastolic heart failure which cleared with IV Lasix.  He was seen in follow-up on 01/26/2018 by [name] at which time it was noted that his breathing had worsened and he was to be set up on oxygen.  He continued to have lower extremity edema which had gotten a little worse.  His weight was actually down 10 pounds from the previous year.  [name] checks labs including CBC, TSH, BNP and metabolic panel.  His proBNP was normal and [name] thought that his lower extremity edema was likely secondary to venous insufficiency.  His other labs were normal.  A chest x-ray was done that showed COPD but no active disease.  His Lasix was decreased to 20 mg daily and he was advised to use compression stockings.  [name] referred Simon to Dr. Chase Simon for pulmonary evaluation of his worsening COPD.  [name] is here today for follow-up with his daughter. He is very pleasant and jovial. His breathing is a little better. He still has LE edema. His activities are still limited. He would like to go fishing but he doesn't feel that he can go and manage his boats.   He has trace R  lower leg edema, 1+ on the left. He avoids salt in diet. His daughter is using fresh and frozen vegetables and not buying any frozen dinners or canned goods.   He weighs at home once or twice a month. He notes that his wt was down to 199 about a week ago which was down for Simon. He reports that he hasn't been taking isosorbide for several months and saw it on his list, wonders if he needs it. No chest pain/pressure/tightness. Has occasional orthopnea and has to sleep in recliner, a few nights per month. He usually sleeps with a wedge pillow.   He was referred to Dr. Chase Simon but could not make the appointment because his daughter was out of town. He has an appointment for 04/06/18 to see Dr. Chase Simon. His O2 sat was 85% on arrival and came up to 93% when seated. He is not using oxygen during the day. He uses it at night with his CPAP and doesn't have the setup to use during the day.   Hasn't been a smoker as an adult.    Past Medical History:  Diagnosis Date  . Anxiety   . Asthma   . Chronic diastolic CHF (congestive heart failure) (HCC)    diastolic   . COPD (chronic obstructive pulmonary disease) ([place])   . Coronary artery disease    s/p PCI of RCA 03/2009 at which time there was  70% ramus and 90% RCA, cath 01/2011 showed patent stents in RCA with moderate prox disesae, aneurysmal left circ and small 90% ramus s/p PCI, patent LAD EF 55%  . Diabetes mellitus   . Diverticulitis Oct. 2013   bleeding in the past and Effient for his CAD was stopped  . DVT (deep venous thrombosis) ([place])   . GERD (gastroesophageal reflux disease)   . Hypercholesterolemia   . Hypertension   . Hypothyroidism   . Obesity (BMI 30-39.9)   . OSA (obstructive sleep apnea)    severe on CPAP  . Peripheral vascular disease ([place]) 11/2006   s/p left stent  . Shortness of breath   . Sleep apnea    severe OSA awaiting CPAP titration    Past Surgical History:  Procedure Laterality Date  . ABDOMINAL AORTAGRAM N/A  04/20/2012   Procedure: ABDOMINAL Maxcine Ham;  Surgeon: Angelia Mould, MD;  Location: [facility] CATH LAB;  Service: Cardiovascular;  Laterality: N/A;  . ABDOMINAL AORTAGRAM N/A 12/29/2012   Procedure: ABDOMINAL Maxcine Ham;  Surgeon: Serafina Mitchell, MD;  Location: [facility] CATH LAB;  Service: Cardiovascular;  Laterality: N/A;  . BALLOON DILATION  12/26/2011   Procedure: BALLOON DILATION;  Surgeon: Garlan Fair, MD;  Location: [facility] ENDOSCOPY;  Service: Endoscopy;  Laterality: N/A;  . COLONOSCOPY  08/14/2012   Procedure: COLONOSCOPY;  Surgeon: Arta Silence, MD;  Location: [facility] ENDOSCOPY;  Service: Endoscopy;  Laterality: N/A;  . CORONARY STENT PLACEMENT  2010 and 2012  . ESOPHAGOGASTRODUODENOSCOPY  12/26/2011   Procedure: ESOPHAGOGASTRODUODENOSCOPY (EGD);  Surgeon: Garlan Fair, MD;  Location: [facility] ENDOSCOPY;  Service: Endoscopy;  Laterality: N/A;  . ESOPHAGOGASTRODUODENOSCOPY  08/13/2012   Procedure: ESOPHAGOGASTRODUODENOSCOPY (EGD);  Surgeon: Arta Silence, MD;  Location: [facility] ENDOSCOPY;  Service: Endoscopy;  Laterality: Left;  . HERNIA REPAIR  1992   evntral hernia repair  . LOWER EXTREMITY ANGIOGRAM Bilateral 12/29/2012   Procedure: LOWER EXTREMITY ANGIOGRAM;  Surgeon: Serafina Mitchell, MD;  Location: [facility] CATH LAB;  Service: Cardiovascular;  Laterality: Bilateral;  bilat lower extrem angio    Current Medications: Current Meds  Medication Sig  . albuterol (PROVENTIL HFA;VENTOLIN HFA) 108 (90 BASE) MCG/ACT inhaler Inhale 2 puffs into the lungs every 6 (six) hours as needed for wheezing or shortness of breath.   Marland Kitchen albuterol (PROVENTIL) (2.5 MG/3ML) 0.083% nebulizer solution Take 3 mLs (2.5 mg total) by nebulization every 4 (four) hours as needed for wheezing or shortness of breath.  Marland Kitchen aspirin EC 81 MG tablet Take 81 mg by mouth daily. At 5 pm  . budesonide-formoterol (SYMBICORT) 160-4.5 MCG/ACT inhaler Inhale 2 puffs into the lungs 2 (two) times daily.  . clobetasol cream (TEMOVATE) 0.05 % APPLY TO  AFFECTED AREA OF ARAMS/LEGS UP TO 2 TIMES DAILY AS NEEDED *NOT TO FACE,GROIN,OR UNDERARMS  . furosemide (LASIX) 20 MG tablet Take 1 tablet (20 mg total) by mouth daily.  Marland Kitchen glimepiride (AMARYL) 2 MG tablet Take 2 mg by mouth daily. At 5 pm  . levothyroxine (SYNTHROID, LEVOTHROID) 150 MCG tablet Take 150 mcg by mouth daily before breakfast.  . loperamide (IMODIUM) 2 MG capsule Take 2 mg by mouth as needed for diarrhea or loose stools (Take as directed).   . meloxicam (MOBIC) 15 MG tablet Take 15 mg by mouth daily.  . metoprolol succinate (TOPROL-XL) 50 MG 24 hr tablet TAKE 1 TABLET BY MOUTH EVERY DAY  . nitroGLYCERIN (NITROSTAT) 0.4 MG SL tablet Place 0.4 mg under the tongue every 5 (five) minutes as needed for  chest pain.  . pantoprazole (PROTONIX) 40 MG tablet TAKE 1 TABLET (40 MG TOTAL) BY MOUTH 2 (TWO) TIMES DAILY.  . rosuvastatin (CRESTOR) 40 MG tablet Take 1 tablet (40 mg total) by mouth daily.  Marland Kitchen triamcinolone cream (KENALOG) 0.1 % APPLY TO AFFECTED AREA UP TO TWICE A DAY AS NEEDED NOT TO FACE/GROIN/UNDERARMS  . [DISCONTINUED] isosorbide mononitrate (IMDUR) 60 MG 24 hr tablet Take 60 mg by mouth daily. At 5 pm     Allergies:   Clopidogrel   Social History   Socioeconomic History  . Marital status: Widowed    Spouse name: Not on file  . Number of children: Not on file  . Years of education: Not on file  . Highest education level: Not on file  Occupational History  . Not on file  Social Needs  . Financial resource strain: Not on file  . Food insecurity:    Worry: Not on file    Inability: Not on file  . Transportation needs:    Medical: Not on file    Non-medical: Not on file  Tobacco Use  . Smoking status: Former Smoker    Packs/day: 0.50    Years: 15.00    Pack years: 7.50    Types: Cigarettes, Cigars    Last attempt to quit: 10/15/1963    Years since quitting: 54.4  . Smokeless tobacco: Never Used  Substance and Sexual Activity  . Alcohol use: No  . Drug use: No  .  Sexual activity: Never  Lifestyle  . Physical activity:    Days per week: Not on file    Minutes per session: Not on file  . Stress: Not on file  Relationships  . Social connections:    Talks on phone: Not on file    Gets together: Not on file    Attends religious service: Not on file    Active member of club or organization: Not on file    Attends meetings of clubs or organizations: Not on file    Relationship status: Not on file  Other Topics Concern  . Not on file  Social History Narrative  . Not on file     Family History: The patient's family history includes Asthma in his sister; Breast cancer in his sister; Diabetes in his brother, mother, and sister; Heart attack in his mother and sister; Heart disease in his father, mother, and sister; Hyperlipidemia in his mother, sister, and sister; Hypertension in his mother, sister, and sister. There is no history of Malignant hyperthermia. ROS:   Please see the history of present illness.     All other systems reviewed and are negative.  EKGs/Labs/Other Studies Reviewed:    The following studies were reviewed today:  Lexiscan myoview 12/12/2016  Nuclear stress EF: 50%.  There was no ST segment deviation noted during stress.  Defect 1: There is a medium defect of moderate severity present in the basal inferior and mid inferior location.  Defect 2: There is a small defect of severe severity present in the apex location.  This is a low risk study.  The left ventricular ejection fraction is mildly decreased (45-54%).   Low risk stress nuclear study with inferior and apical thinning vs small prior infarct; no ischemia; EF 50 with mild global hypokinesis and mild LVE.  Echocardiogram 09/23/2016 Study Conclusions  - Left ventricle: The cavity size was normal. Wall thickness was   increased in a pattern of mild LVH. Systolic function was normal.   The  estimated ejection fraction was in the range of 60% to 65%.   Wall motion  was normal; there were no regional wall motion   abnormalities. Doppler parameters are consistent with abnormal   left ventricular relaxation (grade 1 diastolic dysfunction). The   E/e&' ratio is between 8-15, suggesting indeterminate LV filling   pressure. - Aortic valve: Poorly visualized. Sclerosis without stenosis. - Left atrium: The atrium was normal in size. - Tricuspid valve: There was trivial regurgitation. - Pulmonary arteries: PA peak pressure: 30 mm Hg (S). - Inferior vena cava: The vessel was normal in size. The   respirophasic diameter changes were in the normal range (>= 50%),   consistent with normal central venous pressure.  Impressions: - Compared with a prior study in 2016, there are no significant   changes.  EKG:  EKG is  ordered today.   Recent Labs: 11/13/2017: ALT 51; B Natriuretic Peptide 88.2 01/26/2018: BUN 14; Creatinine, Ser 1.02; Hemoglobin 13.8; NT-Pro BNP 103; Platelets 166; Potassium 4.4; Sodium 141; TSH 2.490   Recent Lipid Panel    Component Value Date/Time   CHOL 101 10/23/2017 0838   TRIG 58 10/23/2017 0838   HDL 42 10/23/2017 0838   CHOLHDL 2.4 10/23/2017 0838   CHOLHDL 4 11/16/2014 1124   VLDL 19.8 11/16/2014 1124   LDLCALC 47 10/23/2017 0838    Physical Exam:    VS:  BP 138/76   Pulse 93   Ht 5\' 4"  (1.626 m)   Wt 205 lb (93 kg)   SpO2 (!) 85%   BMI 35.19 kg/m     Wt Readings from Last 3 Encounters:  03/20/18 205 lb (93 kg)  01/26/18 208 lb (94.3 kg)  11/13/17 201 lb 11.5 oz (91.5 kg)     Physical Exam  Constitutional: He is oriented to person, place, and time. He appears well-developed and well-nourished. No distress.  HENT:  Head: Normocephalic and atraumatic.  Neck: Normal range of motion. Neck supple. No JVD present.  Cardiovascular: Normal rate, regular rhythm and intact distal pulses. Exam reveals friction rub. Exam reveals no gallop.  No murmur heard. Pulmonary/Chest: Effort normal and breath sounds normal. No  respiratory distress. He has no wheezes. He has no rales.  Abdominal: Soft. Bowel sounds are normal.  Central obesity  Musculoskeletal: Normal range of motion. He exhibits edema.  Trace RLE edema, 1+ LLE edema  Neurological: He is alert and oriented to person, place, and time.  Skin: Skin is warm and dry.  Psychiatric: He has a normal mood and affect. His behavior is normal. Thought content normal.  Vitals reviewed.   ASSESSMENT:    1. Coronary artery disease involving native coronary artery of native heart without angina pectoris   2. HYPERTENSION, BENIGN   3. Chronic diastolic CHF (congestive heart failure) ([place])   4. OSA (obstructive sleep apnea)   5. Dyslipidemia   6. Chronic obstructive pulmonary disease, unspecified COPD type ([place])    PLAN:    In order of problems listed above:  CAD: s/p PCI of RCA 6/2010with residual70% ramus and 90% RCA.  Cath 01/2011 showed patent stents in RCA with moderate prox disesae, aneurysmal left circ and small 90% ramus s/p PCI.    No anginal symptoms.  Continue on aspirin 81 mg daily, Toprol-XL 50 mg daily and statin. He has not been taking the Imdur and is not having angina, EF is normal, BP is controlled. Will leave the Imdur off.   Hypertension: On Toprol XL 50 mg daily.  BP well controlled.   Chronic diastolic CHF: Patient had worsening dyspnea and lower extremity edema however proBNP was normal and chest x-ray was clear.  His lower extremity edema is thought to be related to venous insufficiency.  The patient was referred to pulmonology for evaluation of worsening COPD.  His Lasix was decreased to 20 mg daily.  Today his breathing is slightly better. Has his normal orthopnea. Wt is down 3 pouns since decrease in lasix. Contionue current care.   Obstructive sleep apnea: On CPAP as well as home oxygen  Hyperlipidemia: with LDL goal less than 70.  His LDL was at goal at 47 on 10/23/2017.  He will continue on Zetia 10 mg daily and Crestor 40 mg  daily.  Venous insufficiency: Lower leg edema. Pt advised on elevating his legs, low sodium diet, compression stockings. We had an indepth discussion with the pt and his daughter. They were appreciative of the information.   COPD: Pt with decreased O2 sats with exertion. He needs oxygen during the day as well as at night. He has an appointment with Dr. Chase Simon for pulmonology.    Medication Adjustments/Labs and Tests Ordered: Current medicines are reviewed at length with the patient today.  Concerns regarding medicines are outlined above. Labs and tests ordered and medication changes are outlined in the patient instructions below:  Patient Instructions  Medication Instructions: Your physician has recommended you make the following change in your medication:  STOP:Isosorbide  Continue all other medications    Labwork: None Ordered  Procedures/Testing: None Ordered  Follow-Up: Your physician recommends that you schedule a follow-up appointment in: 3-4 months with EddieTurner    Any Additional Special Instructions Will Be Listed Below (If Applicable).   Chronic Venous Insufficiency Chronic venous insufficiency, also called venous stasis, is a condition that prevents blood from being pumped effectively through the veins in your legs. Blood may no longer be pumped effectively from the legs back to the heart. This condition can range from mild to severe. With proper treatment, you should be able to continue with an active life. What are the causes? Chronic venous insufficiency occurs when the vein walls become stretched, weakened, or damaged, or when valves within the vein are damaged. Some common causes of this include:  High blood pressure inside the veins (venous hypertension).  Increased blood pressure in the leg veins from long periods of sitting or standing.  A blood clot that blocks blood flow in a vein (deep vein thrombosis, DVT).  Inflammation of a vein (phlebitis) that  causes a blood clot to form.  Tumors in the pelvis that cause blood to back up.  What increases the risk? The following factors may make you more likely to develop this condition:  Having a family history of this condition.  Obesity.  Pregnancy.  Living without enough physical activity or exercise (sedentary lifestyle).  Smoking.  Having a job that requires long periods of standing or sitting in one place.  Being a certain age. Women in their 51s and 27s and men in their 53s are more likely to develop this condition.  What are the signs or symptoms? Symptoms of this condition include:  Veins that are enlarged, bulging, or twisted (varicose veins).  Skin breakdown or ulcers.  Reddened or discolored skin on the front of the leg.  Brown, smooth, tight, and painful skin just above the ankle, usually on the inside of the leg (lipodermatosclerosis).  Swelling.  How is this diagnosed? This condition may be diagnosed  based on:  Your medical history.  A physical exam.  Tests, such as: ? A procedure that creates an image of a blood vessel and nearby organs and provides information about blood flow through the blood vessel (duplex ultrasound). ? A procedure that tests blood flow (plethysmography). ? A procedure to look at the veins using X-ray and dye (venogram).  How is this treated? The goals of treatment are to help you return to an active life and to minimize pain or disability. Treatment depends on the severity of your condition, and it may include:  Wearing compression stockings. These can help relieve symptoms and help prevent your condition from getting worse. However, they do not cure the condition.  Sclerotherapy. This is a procedure involving an injection of a material that "dissolves" damaged veins.  Surgery. This may involve: ? Removing a diseased vein (vein stripping). ? Cutting off blood flow through the vein (laser ablation surgery). ? Repairing a  valve.  Follow these instructions at home:  Wear compression stockings as told by your health care provider. These stockings help to prevent blood clots and reduce swelling in your legs.  Take over-the-counter and prescription medicines only as told by your health care provider.  Stay active by exercising, walking, or doing different activities. Ask your health care provider what activities are safe for you and how much exercise you need.  Drink enough fluid to keep your urine clear or pale yellow.  Do not use any products that contain nicotine or tobacco, such as cigarettes and e-cigarettes. If you need help quitting, ask your health care provider.  Keep all follow-up visits as told by your health care provider. This is important. Contact a health care provider if:  You have redness, swelling, or more pain in the affected area.  You see a red streak or line that extends up or down from the affected area.  You have skin breakdown or a loss of skin in the affected area, even if the breakdown is small.  You get an injury in the affected area. Get help right away if:  You get an injury and an open wound in the affected area.  You have severe pain that does not get better with medicine.  You have sudden numbness or weakness in the foot or ankle below the affected area, or you have trouble moving your foot or ankle.  You have a fever and you have worse or persistent symptoms.  You have chest pain.  You have shortness of breath. Summary  Chronic venous insufficiency, also called venous stasis, is a condition that prevents blood from being pumped effectively through the veins in your legs.  Chronic venous insufficiency occurs when the vein walls become stretched, weakened, or damaged, or when valves within the vein are damaged.  Treatment for this condition depends on how severe your condition is, and it may involve wearing compression stockings or having a procedure.  Make sure  you stay active by exercising, walking, or doing different activities. Ask your health care provider what activities are safe for you and how much exercise you need. This information is not intended to replace advice given to you by your health care provider. Make sure you discuss any questions you have with your health care provider. Document Released: 02/03/2007 Document Revised: 08/19/2016 Document Reviewed: 08/19/2016 Elsevier Interactive Patient Education  2017 [place].  How to Use Compression Stockings Compression stockings are elastic socks that squeeze the legs. They help to increase blood flow to  the legs, decrease swelling in the legs, and reduce the chance of developing blood clots in the lower legs. Compression stockings are often used by people who:  Are recovering from surgery.  Have poor circulation in their legs.  Are prone to getting blood clots in their legs.  Have varicose veins.  Sit or stay in bed for long periods of time.  How to use compression stockings Before you put on your compression stockings:  Make sure that they are the correct size. If you do not know your size, ask your health care provider.  Make sure that they are clean, dry, and in good condition.  Check them for rips and tears. Do not put them on if they are ripped or torn.  Put your stockings on first thing in the morning, before you get out of bed. Keep them on for as long as your health care provider advises. When you are wearing your stockings:  Keep them as smooth as possible. Do not allow them to bunch up. It is especially important to prevent the stockings from bunching up around your toes or behind your knees.  Do not roll the stockings downward and leave them rolled down. This can decrease blood flow to your leg.  Change them right away if they become wet or dirty.  When you take off your stockings, inspect your legs and feet. Anything that does not seem normal may require medical  attention. Look for:  Open sores.  Red spots.  Swelling.  Information and tips  Do not stop wearing your compression stockings without talking to your health care provider first.  Wash your stockings every day with mild detergent in cold or warm water. Do not use bleach. Air-dry your stockings or dry them in a clothes dryer on low heat.  Replace your stockings every 3-6 months.  If skin moisturizing is part of your treatment plan, apply lotion or cream at night so that your skin will be dry when you put on the stockings in the morning. It is harder to put the stockings on when you have lotion on your legs or feet. Contact a health care provider if: Remove your stockings and seek medical care if:  You have a feeling of pins and needles in your feet or legs.  You have any new changes in your skin.  You have skin lesions that are getting worse.  You have swelling or pain that is getting worse.  Get help right away if:  You have numbness or tingling in your lower legs that does not get better right after you take the stockings off.  Your toes or feet become cold and blue.  You develop open sores or red spots on your legs that do not go away.  You see or feel a warm spot on your leg.  You have new swelling or soreness in your leg.  You are short of breath or you have chest pain for no reason.  You have a rapid or irregular heartbeat.  You feel light-headed or dizzy. This information is not intended to replace advice given to you by your health care provider. Make sure you discuss any questions you have with your health care provider. Document Released: 07/28/2009 Document Revised: 02/28/2016 Document Reviewed: 09/07/2014 Elsevier Interactive Patient Education  [employer].    If you need a refill on your cardiac medications before your next appointment, please call your pharmacy.      Signed, Daune Perch, NP  03/20/2018  4:54 PM    [facility]

## 2018-03-23 DIAGNOSIS — I5032 Chronic diastolic (congestive) heart failure: Secondary | ICD-10-CM | POA: Diagnosis not present

## 2018-04-22 DIAGNOSIS — I5032 Chronic diastolic (congestive) heart failure: Secondary | ICD-10-CM | POA: Diagnosis not present

## 2018-04-29 DIAGNOSIS — I2510 Atherosclerotic heart disease of native coronary artery without angina pectoris: Secondary | ICD-10-CM | POA: Diagnosis not present

## 2018-04-29 DIAGNOSIS — I110 Hypertensive heart disease with heart failure: Secondary | ICD-10-CM | POA: Diagnosis not present

## 2018-04-29 DIAGNOSIS — E1159 Type 2 diabetes mellitus with other circulatory complications: Secondary | ICD-10-CM | POA: Diagnosis not present

## 2018-04-29 DIAGNOSIS — E039 Hypothyroidism, unspecified: Secondary | ICD-10-CM | POA: Diagnosis not present

## 2018-04-29 DIAGNOSIS — I739 Peripheral vascular disease, unspecified: Secondary | ICD-10-CM | POA: Diagnosis not present

## 2018-04-29 DIAGNOSIS — Z6836 Body mass index (BMI) 36.0-36.9, adult: Secondary | ICD-10-CM | POA: Diagnosis not present

## 2018-04-29 DIAGNOSIS — I5030 Unspecified diastolic (congestive) heart failure: Secondary | ICD-10-CM | POA: Diagnosis not present

## 2018-04-29 DIAGNOSIS — J449 Chronic obstructive pulmonary disease, unspecified: Secondary | ICD-10-CM | POA: Diagnosis not present

## 2018-04-29 DIAGNOSIS — E782 Mixed hyperlipidemia: Secondary | ICD-10-CM | POA: Diagnosis not present

## 2018-05-01 ENCOUNTER — Other Ambulatory Visit: Payer: Medicare HMO

## 2018-05-01 ENCOUNTER — Encounter: Payer: Self-pay | Admitting: Internal Medicine

## 2018-05-01 ENCOUNTER — Ambulatory Visit: Payer: Medicare HMO | Admitting: Internal Medicine

## 2018-05-01 VITALS — BP 128/74 | HR 74 | Ht 64.0 in | Wt 207.4 lb

## 2018-05-01 DIAGNOSIS — Z8709 Personal history of other diseases of the respiratory system: Secondary | ICD-10-CM

## 2018-05-01 DIAGNOSIS — R0609 Other forms of dyspnea: Secondary | ICD-10-CM | POA: Diagnosis not present

## 2018-05-01 DIAGNOSIS — R0602 Shortness of breath: Secondary | ICD-10-CM

## 2018-05-01 NOTE — Patient Instructions (Signed)
ICD-10-CM   1. Dyspnea on exertion R06.09   2. History of COPD Z87.09     Do HRCT supine and prone Do blood alpha 1 Do full PFT  Followup - return to see APP but after completing above

## 2018-05-01 NOTE — Progress Notes (Signed)
Subjective:    Patient ID: [name], male    DOB: [birth year], 84 y.o.   MRN: [id number]  PCP [name], MD   HPI  IOV 05/01/2018  Chief Complaint  Patient presents with  . Consult    Referred by Dr. Radford Pax due to SOB and COPD after diagnosis x2 years ago. Pt is a former [facility] pt last seen 12/27/13. Pt has c/o SOB that happens at anytime especially if he tries to rush and states he has an occ. CP. Denies any cough.    [name] , 84 y.o. , with dob Jun 15, 1930 and male ,Not Hispanic or Latino from [address] [place] [address] - presents to lung clinic for dyspnea, copd eval. He is 84 years old and referred by Dr. Radford Pax Hisdaughter is here with him. He tells me that he's had insidious onset of shortness of breath for the last several years. He is unable to quantify exactly. Nevertheless in the last few years is progressively worse despite being on Symbicort for the last few years. He has associated sleep apnea and is compliant with CPAP as managed by Dr. Radford Pax. He does have a mild cough but no sputum production. He has obesity but this has been stable. He denies much of smoking but this clear-cut emphysema on visualization of his images and on spirometry 4 years ago. His most recent hemoglobin appears okay.   CAT COPD Symptom & Quality of Life Score (GSK trademark) 0 is no burden. 5 is highest burden 05/01/2018   Never Cough -> Cough all the time 2  No phlegm in chest -> Chest is full of phlegm 2  No chest tightness -> Chest feels very tight 3  No dyspnea for 1 flight stairs/hill -> Very dyspneic for 1 flight of stairs 5  No limitations for ADL at home -> Very limited with ADL at home 3  Confident leaving home -> Not at all confident leaving home 3  Sleep soundly -> Do not sleep soundly because of lung condition 4  Lots of Energy -> No energy at all 3  TOTAL Score (max 40)  25      Spirometry in March 2015 and personally visualized image shows FEV1 0.97 L/45%  with a ratio of 50% insistent with Gold stage III obstruction   Imaging that I personally visualized includes April 2019 chest x-ray that shows hyperinflation with possible bullous disease in the lung base. He has CT angiogram of the chest in February 2016 that ruled out pulmonary embolism. I personally visualized this and did not see any mass. He does have emphysema. Pulmonary embolism was ruled out.     Results for Eddie, Simon (MRN [id number]) as of 05/01/2018 09:14  Ref. Range 01/26/2018 09:56  Hemoglobin Latest Ref Range: 13.0 - 17.7 g/dL 13.8  Results for Eddie, Simon (MRN [id number]) as of 05/01/2018 09:14  Ref. Range 01/26/2018 09:56  Creatinine Latest Ref Range: 0.76 - 1.27 mg/dL 1.02    Cardiac workup includes March 2018 nuclear medicine cardiac stress test: Low risk scan and echocardiogramDecember 2017 with ejection fraction 33%ASN grade 1 diastolic dysfunction.   has a past medical history of Anxiety, Asthma, Chronic diastolic CHF (congestive heart failure) ([place]), COPD (chronic obstructive pulmonary disease) ([place]), Coronary artery disease, Diabetes mellitus, Diverticulitis (Oct. 2013), DVT (deep venous thrombosis) ([place]), GERD (gastroesophageal reflux disease), Hypercholesterolemia, Hypertension, Hypothyroidism, Obesity (BMI 30-39.9), OSA (obstructive sleep apnea), Peripheral vascular disease ([place]) (11/2006), Shortness of breath, and Sleep  apnea.   reports that he quit smoking about 54 years ago. His smoking use included cigarettes and cigars. He has a 7.50 pack-year smoking history. He has never used smokeless tobacco.  Past Surgical History:  Procedure Laterality Date  . ABDOMINAL AORTAGRAM N/A 04/20/2012   Procedure: ABDOMINAL Maxcine Ham;  Surgeon: Angelia Mould, MD;  Location: [facility] CATH LAB;  Service: Cardiovascular;  Laterality: N/A;  . ABDOMINAL AORTAGRAM N/A 12/29/2012   Procedure: ABDOMINAL Maxcine Ham;  Surgeon: Serafina Mitchell, MD;  Location: [facility] CATH LAB;  Service:  Cardiovascular;  Laterality: N/A;  . BALLOON DILATION  12/26/2011   Procedure: BALLOON DILATION;  Surgeon: Garlan Fair, MD;  Location: [facility] ENDOSCOPY;  Service: Endoscopy;  Laterality: N/A;  . COLONOSCOPY  08/14/2012   Procedure: COLONOSCOPY;  Surgeon: Arta Silence, MD;  Location: [facility] ENDOSCOPY;  Service: Endoscopy;  Laterality: N/A;  . CORONARY STENT PLACEMENT  2010 and 2012  . ESOPHAGOGASTRODUODENOSCOPY  12/26/2011   Procedure: ESOPHAGOGASTRODUODENOSCOPY (EGD);  Surgeon: Garlan Fair, MD;  Location: [facility] ENDOSCOPY;  Service: Endoscopy;  Laterality: N/A;  . ESOPHAGOGASTRODUODENOSCOPY  08/13/2012   Procedure: ESOPHAGOGASTRODUODENOSCOPY (EGD);  Surgeon: Arta Silence, MD;  Location: [facility] ENDOSCOPY;  Service: Endoscopy;  Laterality: Left;  . HERNIA REPAIR  1992   evntral hernia repair  . LOWER EXTREMITY ANGIOGRAM Bilateral 12/29/2012   Procedure: LOWER EXTREMITY ANGIOGRAM;  Surgeon: Serafina Mitchell, MD;  Location: [facility] CATH LAB;  Service: Cardiovascular;  Laterality: Bilateral;  bilat lower extrem angio    Allergies  Allergen Reactions  . Clopidogrel Itching    Immunization History  Administered Date(s) Administered  . Influenza Split 07/14/2013  . Influenza, High Dose Seasonal PF 07/14/2017  . Pneumococcal-Unspecified 08/14/2013    Family History  Problem Relation Age of Onset  . Diabetes Mother   . Hyperlipidemia Mother   . Hypertension Mother   . Heart attack Mother   . Heart disease Mother        before age 75  . Diabetes Brother   . Heart disease Father        before age 76  . Breast cancer Sister   . Diabetes Sister   . Hyperlipidemia Sister   . Hypertension Sister   . Heart attack Sister   . Hyperlipidemia Sister   . Hypertension Sister   . Heart disease Sister        Heart Disease before age 53  . Asthma Sister   . Malignant hyperthermia Neg Hx      Current Outpatient Medications:  .  albuterol (PROVENTIL HFA;VENTOLIN HFA) 108 (90 BASE) MCG/ACT inhaler,  Inhale 2 puffs into the lungs every 6 (six) hours as needed for wheezing or shortness of breath. , Disp: , Rfl:  .  albuterol (PROVENTIL) (2.5 MG/3ML) 0.083% nebulizer solution, Take 3 mLs (2.5 mg total) by nebulization every 4 (four) hours as needed for wheezing or shortness of breath., Disp: 75 mL, Rfl: 0 .  aspirin EC 81 MG tablet, Take 81 mg by mouth daily. At 5 pm, Disp: , Rfl:  .  ezetimibe (ZETIA) 10 MG tablet, Take 10 mg by mouth daily., Disp: , Rfl: 3 .  furosemide (LASIX) 20 MG tablet, Take 1 tablet (20 mg total) by mouth daily. (Patient taking differently: Take 40 mg by mouth daily. ), Disp: 90 tablet, Rfl: 3 .  glimepiride (AMARYL) 2 MG tablet, Take 2 mg by mouth daily. At 5 pm, Disp: , Rfl:  .  levothyroxine (SYNTHROID, LEVOTHROID) 150 MCG tablet, Take 150 mcg  by mouth daily before breakfast., Disp: , Rfl:  .  loperamide (IMODIUM) 2 MG capsule, Take 2 mg by mouth as needed for diarrhea or loose stools (Take as directed). , Disp: , Rfl:  .  meloxicam (MOBIC) 15 MG tablet, Take 15 mg by mouth daily., Disp: , Rfl: 5 .  metoprolol succinate (TOPROL-XL) 50 MG 24 hr tablet, TAKE 1 TABLET BY MOUTH EVERY DAY, Disp: 30 tablet, Rfl: 2 .  pantoprazole (PROTONIX) 40 MG tablet, TAKE 1 TABLET (40 MG TOTAL) BY MOUTH 2 (TWO) TIMES DAILY., Disp: 60 tablet, Rfl: 5 .  rosuvastatin (CRESTOR) 40 MG tablet, Take 1 tablet (40 mg total) by mouth daily., Disp: 30 tablet, Rfl: 2 .  triamcinolone cream (KENALOG) 0.1 %, APPLY TO AFFECTED AREA UP TO TWICE A DAY AS NEEDED NOT TO FACE/GROIN/UNDERARMS, Disp: , Rfl: 3 .  budesonide-formoterol (SYMBICORT) 160-4.5 MCG/ACT inhaler, Inhale 2 puffs into the lungs 2 (two) times daily., Disp: , Rfl:  .  clobetasol cream (TEMOVATE) 0.05 %, APPLY TO AFFECTED AREA OF ARAMS/LEGS UP TO 2 TIMES DAILY AS NEEDED *NOT TO FACE,GROIN,OR UNDERARMS, Disp: , Rfl: 3 .  ezetimibe (ZETIA) 10 MG tablet, Take 1 tablet (10 mg total) by mouth daily., Disp: 90 tablet, Rfl: 3 .  nitroGLYCERIN  (NITROSTAT) 0.4 MG SL tablet, Place 0.4 mg under the tongue every 5 (five) minutes as needed for chest pain., Disp: , Rfl:     Review of Systems  Constitutional: Negative for fever.  HENT: Negative for congestion, dental problem, ear pain, nosebleeds, postnasal drip, rhinorrhea, sinus pressure, sneezing, sore throat and trouble swallowing.   Eyes: Negative for redness and itching.  Respiratory: Positive for shortness of breath and wheezing. Negative for cough and chest tightness.   Cardiovascular: Positive for chest pain, palpitations and leg swelling.  Gastrointestinal: Negative for nausea and vomiting.  Genitourinary: Negative for dysuria.  Musculoskeletal: Positive for joint swelling.  Skin: Negative for rash.  Allergic/Immunologic: Negative.  Negative for environmental allergies, food allergies and immunocompromised state.  Neurological: Negative for headaches.  Hematological: Does not bruise/bleed easily.  Psychiatric/Behavioral: Negative for dysphoric mood. The patient is not nervous/anxious.        Objective:   Physical Exam  Constitutional: He is oriented to person, place, and time. He appears well-developed and well-nourished. No distress.  HENT:  Head: Normocephalic and atraumatic.  Right Ear: External ear normal.  Left Ear: External ear normal.  Mouth/Throat: Oropharynx is clear and moist. No oropharyngeal exudate.  mallampatti class 3  Eyes: Pupils are equal, round, and reactive to light. Conjunctivae and EOM are normal. Right eye exhibits no discharge. Left eye exhibits no discharge. No scleral icterus.  Neck: Normal range of motion. Neck supple. No JVD present. No tracheal deviation present. No thyromegaly present.  Cardiovascular: Normal rate, regular rhythm and intact distal pulses. Exam reveals no gallop and no friction rub.  No murmur heard. Pulmonary/Chest: Effort normal and breath sounds normal. No respiratory distress. He has no wheezes. He has no rales. He  exhibits no tenderness.  Abdominal: Soft. Bowel sounds are normal. He exhibits no distension and no mass. There is no tenderness. There is no rebound and no guarding.  Visceral obesity +  Musculoskeletal: Normal range of motion. He exhibits no edema or tenderness.  Lymphadenopathy:    He has no cervical adenopathy.  Neurological: He is alert and oriented to person, place, and time. He has normal reflexes. No cranial nerve deficit. Coordination normal.  Skin: Skin is warm and dry.  No rash noted. He is not diaphoretic. No erythema. No pallor.  Psychiatric: He has a normal mood and affect. His behavior is normal. Judgment and thought content normal.  Nursing note and vitals reviewed.   Vitals:   05/01/18 0910  BP: 128/74  Pulse: 74  SpO2: 99%  Weight: 207 lb 6.4 oz (94.1 kg)  Height: 5\' 4"  (1.626 m)    Estimated body mass index is 35.6 kg/m as calculated from the following:   Height as of this encounter: 5\' 4"  (1.626 m).   Weight as of this encounter: 207 lb 6.4 oz (94.1 kg).       Assessment & Plan:     ICD-10-CM   1. Dyspnea on exertion R06.09 Pulmonary function test  2. History of COPD Z87.09   3. Shortness of breath R06.02 Pulmonary function test    CT Chest High Resolution    dyspnea most likely is due to obesity, physical deconditioning and diastolic dysfunction associated with COPD. It is best to restage his COPD since it has been 4 years since his last pulmonary function test. Also will use the opportunity to rule out other concomitant lung diseases such as mediastinal tumors or lung nodules or interstitial lung disease. Therefore we will get pulmonary function test and high resolution CT chest. Allergies African-American also get alpha 1 antitrypsin because of the chest x-ray at that he had more bullous disease in the lung base.  He and his daughter will come back for follow-up after the above; okay to see nurse practitioner    Dr. Brand Males, M.D.,  [facility].C.P Pulmonary and Critical Care Medicine Staff Physician, [place] Director - Interstitial Lung Disease  Program  Pulmonary [facility] at [place], [place], [address]  Pager: [phone], If no answer or between  15:00h - 7:00h: call [phone] Telephone: [phone]

## 2018-05-01 NOTE — Addendum Note (Signed)
Addended by: Jannette Spanner on: 05/01/2018 09:58 AM   Modules accepted: Orders

## 2018-05-07 LAB — ALPHA-1 ANTITRYPSIN PHENOTYPE: A-1 Antitrypsin, Ser: 130 mg/dL (ref 83–199)

## 2018-05-11 ENCOUNTER — Ambulatory Visit (INDEPENDENT_AMBULATORY_CARE_PROVIDER_SITE_OTHER)
Admission: RE | Admit: 2018-05-11 | Discharge: 2018-05-11 | Disposition: A | Discharge status: Home / Self Care | Payer: Medicare HMO | Source: Ambulatory Visit | Attending: Internal Medicine | Admitting: Internal Medicine

## 2018-05-11 DIAGNOSIS — R0602 Shortness of breath: Secondary | ICD-10-CM | POA: Diagnosis not present

## 2018-05-11 DIAGNOSIS — J432 Centrilobular emphysema: Secondary | ICD-10-CM | POA: Diagnosis not present

## 2018-05-18 DIAGNOSIS — G4733 Obstructive sleep apnea (adult) (pediatric): Secondary | ICD-10-CM | POA: Diagnosis not present

## 2018-05-20 ENCOUNTER — Encounter: Payer: Self-pay | Admitting: Primary Care

## 2018-05-20 ENCOUNTER — Ambulatory Visit: Payer: Medicare HMO | Admitting: Primary Care

## 2018-05-20 ENCOUNTER — Ambulatory Visit (INDEPENDENT_AMBULATORY_CARE_PROVIDER_SITE_OTHER): Payer: Medicare HMO | Admitting: Internal Medicine

## 2018-05-20 VITALS — BP 132/80 | HR 78 | Ht 64.0 in | Wt 209.0 lb

## 2018-05-20 DIAGNOSIS — R0609 Other forms of dyspnea: Secondary | ICD-10-CM | POA: Diagnosis not present

## 2018-05-20 DIAGNOSIS — J449 Chronic obstructive pulmonary disease, unspecified: Secondary | ICD-10-CM

## 2018-05-20 DIAGNOSIS — R0602 Shortness of breath: Secondary | ICD-10-CM

## 2018-05-20 LAB — PULMONARY FUNCTION TEST
FEF 25-75 PRE: 0.3 L/s
FEF 25-75 Post: 0.35 L/sec
FEF2575-%CHANGE-POST: 16 %
FEF2575-%PRED-POST: 27 %
FEF2575-%PRED-PRE: 23 %
FEV1-%Change-Post: 1 %
FEV1-%PRED-POST: 42 %
FEV1-%Pred-Pre: 42 %
FEV1-Post: 0.79 L
FEV1-Pre: 0.77 L
FEV1FVC-%CHANGE-POST: -2 %
FEV1FVC-%Pred-Pre: 65 %
FEV6-%CHANGE-POST: 2 %
FEV6-%PRED-PRE: 62 %
FEV6-%Pred-Post: 64 %
FEV6-PRE: 1.54 L
FEV6-Post: 1.59 L
FEV6FVC-%Change-Post: -2 %
FEV6FVC-%Pred-Post: 100 %
FEV6FVC-%Pred-Pre: 103 %
FVC-%Change-Post: 5 %
FVC-%PRED-PRE: 60 %
FVC-%Pred-Post: 63 %
FVC-Post: 1.71 L
FVC-Pre: 1.63 L
PRE FEV1/FVC RATIO: 48 %
Post FEV1/FVC ratio: 46 %
Post FEV6/FVC ratio: 93 %
Pre FEV6/FVC Ratio: 95 %

## 2018-05-20 MED ORDER — AEROCHAMBER MV MISC: 0 refills | Status: DC

## 2018-05-20 MED ORDER — TIOTROPIUM BROMIDE MONOHYDRATE 2.5 MCG/ACT IN AERS: 2.0000 | INHALATION_SPRAY | Freq: Every day | RESPIRATORY_TRACT | 5 refills | Status: DC

## 2018-05-20 NOTE — Assessment & Plan Note (Addendum)
Staging: COPD gold III, mild decrease in FEV1 and FVC/FEV1 ratio since 2015  - PFTs 05/20/18- FVC 1.63 (60%), FEV1  0.77 (42%), Ratio 48 (65%) Unable to complete DLCO or lung volumes d/t patient not being able to perform the proper technique.  - HRCT completed showing no evidence of ILD  - Reports worsening of sob over the past few months to year - NO evidence of exacerbation - Given spacer for symbicort  - Add LAMA , Spiriva respimat 2 puffs daily - Add Vit D supplement daily for prevention of exacerbations   - FU in 2 months with 6 min walk test  - Refer to pulmonary rehab

## 2018-05-20 NOTE — Patient Instructions (Addendum)
FU in 2 months, needs 6 min walk test prior   NEW-  Spiriva respimate 2 puffs once a day   Use spacer with Symbicort   Refer pulmonary rehab re: COPD GOLD III   Add vit D 2,000 units today

## 2018-05-20 NOTE — Progress Notes (Signed)
@Patient  ID: [name], male    DOB: 13-Mar-1930, 84 y.o.   MRN: [id number]  Chief Complaint  Patient presents with  . Follow-up    Breathing is unchanged since last OV. Still reports having DOE. Denies chest tightness, wheezing or coughing.    Referring provider: [name], MD  HPI: 84 year old male, former smoker (quit >50 yeas ago). PMH obesity, emphysema, OSA on CPAP. Patient of Dr. Chase Caller, last seen on 7/19.  Referred by Dr. Heron Nay for dyspnea and COPD eval. Reports progression of sob over the last several years despite being on symbicort. Mild cough with no sputum production. Evidence of emphysema on imaging and spirometry. CAT score 25. Spirometry 12/2013- FEV1 0.97, ratio 50% consistent with COPD GOLD III obstruction. Dr. Chase Caller felt dyspnea likely d/t obesity, physical deconditioning and diastolic dysfunction associated COPD. Plan repeat full PFTs to re-stage COPD, HRCT and alpha 1 antitypsin.   Tests: >>HRCT 05/11/18- IMPRESSION: No findings to suggest interstitial lung disease. Mild diffuse bronchial wall thickening with mild centrilobular and paraseptal emphysema; imaging findings compatible with the reported clinical history of COPD. >>PFTs 05/20/18- FVC 1.63 (60%), FEV1  0.77 (42%), Ratio 48 (65%)  05/20/2018 Presents today for follow up dyspnea with his wife. Reports progressive worsening of breathing over the past few years. His main goal is to be able to fish. Continues Symbicort as prescribed. Wearing CPAP every night. Denies cough or wheeze.  Patient had pulmonary function test today showing obstruction, no bronchodilator response. Unable to complete DLCO or lung volumes d/t patient not being able to perform the proper technique. HRCT completed showing no evidence of ILD.    Allergies  Allergen Reactions  . Clopidogrel Itching    Immunization History  Administered Date(s) Administered  . Influenza Split 07/14/2013  . Influenza, High Dose Seasonal PF  07/14/2017  . Pneumococcal-Unspecified 08/14/2013    Past Medical History:  Diagnosis Date  . Anxiety   . Asthma   . Chronic diastolic CHF (congestive heart failure) ([facility])    diastolic   . COPD (chronic obstructive pulmonary disease) ([place])   . Coronary artery disease    s/p PCI of RCA 03/2009 at which time there was 70% ramus and 90% RCA, cath 01/2011 showed patent stents in RCA with moderate prox disesae, aneurysmal left circ and small 90% ramus s/p PCI, patent LAD EF 55%  . Diabetes mellitus   . Diverticulitis Oct. 2013   bleeding in the past and Effient for his CAD was stopped  . DVT (deep venous thrombosis) ([place])   . GERD (gastroesophageal reflux disease)   . Hypercholesterolemia   . Hypertension   . Hypothyroidism   . Obesity (BMI 30-39.9)   . OSA (obstructive sleep apnea)    severe on CPAP  . Peripheral vascular disease ([place]) 11/2006   s/p left stent  . Shortness of breath   . Sleep apnea    severe OSA awaiting CPAP titration    Tobacco History: Social History   Tobacco Use  Smoking Status Former Smoker  . Packs/day: 0.50  . Years: 15.00  . Pack years: 7.50  . Types: Cigarettes, Cigars  . Last attempt to quit: 10/15/1963  . Years since quitting: 54.6  Smokeless Tobacco Never Used   Counseling given: Not Answered   Outpatient Medications Prior to Visit  Medication Sig Dispense Refill  . albuterol (PROVENTIL HFA;VENTOLIN HFA) 108 (90 BASE) MCG/ACT inhaler Inhale 2 puffs into the lungs every 6 (six) hours as needed for wheezing  or shortness of breath.     Marland Kitchen albuterol (PROVENTIL) (2.5 MG/3ML) 0.083% nebulizer solution Take 3 mLs (2.5 mg total) by nebulization every 4 (four) hours as needed for wheezing or shortness of breath. 75 mL 0  . aspirin EC 81 MG tablet Take 81 mg by mouth daily. At 5 pm    . budesonide-formoterol (SYMBICORT) 160-4.5 MCG/ACT inhaler Inhale 2 puffs into the lungs 2 (two) times daily.    . clobetasol cream (TEMOVATE) 0.05 % APPLY TO AFFECTED  AREA OF ARAMS/LEGS UP TO 2 TIMES DAILY AS NEEDED *NOT TO FACE,GROIN,OR UNDERARMS  3  . ezetimibe (ZETIA) 10 MG tablet Take 1 tablet (10 mg total) by mouth daily. 90 tablet 3  . ezetimibe (ZETIA) 10 MG tablet Take 10 mg by mouth daily.  3  . furosemide (LASIX) 20 MG tablet Take 1 tablet (20 mg total) by mouth daily. (Patient taking differently: Take 40 mg by mouth daily. ) 90 tablet 3  . glimepiride (AMARYL) 2 MG tablet Take 2 mg by mouth daily. At 5 pm    . levothyroxine (SYNTHROID, LEVOTHROID) 150 MCG tablet Take 150 mcg by mouth daily before breakfast.    . loperamide (IMODIUM) 2 MG capsule Take 2 mg by mouth as needed for diarrhea or loose stools (Take as directed).     . meloxicam (MOBIC) 15 MG tablet Take 15 mg by mouth daily.  5  . metoprolol succinate (TOPROL-XL) 50 MG 24 hr tablet TAKE 1 TABLET BY MOUTH EVERY DAY 30 tablet 2  . nitroGLYCERIN (NITROSTAT) 0.4 MG SL tablet Place 0.4 mg under the tongue every 5 (five) minutes as needed for chest pain.    . pantoprazole (PROTONIX) 40 MG tablet TAKE 1 TABLET (40 MG TOTAL) BY MOUTH 2 (TWO) TIMES DAILY. 60 tablet 5  . rosuvastatin (CRESTOR) 40 MG tablet Take 1 tablet (40 mg total) by mouth daily. 30 tablet 2  . triamcinolone cream (KENALOG) 0.1 % APPLY TO AFFECTED AREA UP TO TWICE A DAY AS NEEDED NOT TO FACE/GROIN/UNDERARMS  3   No facility-administered medications prior to visit.     Review of Systems  Review of Systems  Constitutional: Positive for fatigue. Negative for chills, diaphoresis and fever.  HENT: Negative.   Respiratory: Positive for shortness of breath. Negative for cough, chest tightness and wheezing.   Cardiovascular: Negative.    Physical Exam  BP 132/80 (BP Location: Right Arm, Cuff Size: Normal)   Pulse 78   Ht 5\' 4"  (1.626 m)   Wt 209 lb (94.8 kg)   SpO2 94%   BMI 35.87 kg/m  Physical Exam  Constitutional: He is oriented to person, place, and time. He appears well-developed and well-nourished. No distress.   Elderly gentleman, appears well. No acute distress.   HENT:  Head: Normocephalic and atraumatic.  Eyes: Pupils are equal, round, and reactive to light. EOM are normal.  Neck: Normal range of motion. Neck supple.  Cardiovascular: Normal rate and regular rhythm.  Pulmonary/Chest: Effort normal and breath sounds normal. No respiratory distress. He has no wheezes.  [place] t/o   Abdominal:  Abdominal obesity   Musculoskeletal: Normal range of motion.  Neurological: He is alert and oriented to person, place, and time.  Skin: Skin is warm and dry.  Psychiatric: He has a normal mood and affect. His behavior is normal. Judgment and thought content normal.     Lab Results:  CBC    Component Value Date/Time   WBC 7.5 01/26/2018 0956  WBC 9.5 11/14/2017 0514   RBC 4.71 01/26/2018 0956   RBC 4.66 11/14/2017 0514   HGB 13.8 01/26/2018 0956   HCT 41.4 01/26/2018 0956   PLT 166 01/26/2018 0956   MCV 88 01/26/2018 0956   MCH 29.3 01/26/2018 0956   MCH 29.0 11/14/2017 0514   MCHC 33.3 01/26/2018 0956   MCHC 32.1 11/14/2017 0514   RDW 14.1 01/26/2018 0956   LYMPHSABS 0.7 11/13/2017 1425   LYMPHSABS 2.1 12/05/2016 0957   MONOABS 1.3 (H) 11/13/2017 1425   EOSABS 0.0 11/13/2017 1425   EOSABS 0.2 12/05/2016 0957   BASOSABS 0.0 11/13/2017 1425   BASOSABS 0.0 12/05/2016 0957    BMET    Component Value Date/Time   NA 141 01/26/2018 0956   K 4.4 01/26/2018 0956   CL 100 01/26/2018 0956   CO2 27 01/26/2018 0956   GLUCOSE 124 (H) 01/26/2018 0956   GLUCOSE 198 (H) 11/14/2017 0514   BUN 14 01/26/2018 0956   CREATININE 1.02 01/26/2018 0956   CALCIUM 9.6 01/26/2018 0956   GFRNONAA 67 01/26/2018 0956   GFRAA 77 01/26/2018 0956    BNP    Component Value Date/Time   BNP 88.2 11/13/2017 1425    ProBNP    Component Value Date/Time   PROBNP 103 01/26/2018 0956   PROBNP 33.0 11/16/2014 1124    Imaging: Ct Chest High Resolution  Result Date: 05/11/2018 CLINICAL DATA:  84 year old  male with history of shortness of breath. COPD. Evaluate for interstitial lung disease. EXAM: CT CHEST WITHOUT CONTRAST TECHNIQUE: Multidetector CT imaging of the chest was performed following the standard protocol without intravenous contrast. High resolution imaging of the lungs, as well as inspiratory and expiratory imaging, was performed. COMPARISON:  Chest CT 11/17/2014. FINDINGS: Cardiovascular: Heart size is normal. There is no significant pericardial fluid, thickening or pericardial calcification. There is aortic atherosclerosis, as well as atherosclerosis of the great vessels of the mediastinum and the coronary arteries, including calcified atherosclerotic plaque in the left main, left anterior descending, left circumflex and right coronary arteries. Severe calcifications of the aortic valve. Mediastinum/Nodes: No pathologically enlarged mediastinal or hilar lymph nodes. Please note that accurate exclusion of hilar adenopathy is limited on noncontrast CT scans. Esophagus is unremarkable in appearance. No axillary lymphadenopathy. Lungs/Pleura: High-resolution images demonstrate no significant regions of ground-glass attenuation, subpleural reticulation, traction bronchiectasis or frank honeycombing. Inspiratory and expiratory imaging is unremarkable. Scattered areas of linear scarring or subsegmental atelectasis are noted in the lungs bilaterally, most evident in the right middle lobe and inferior segment of the lingula. Calcified granuloma in the periphery of the left upper lobe posteriorly. No other suspicious appearing pulmonary nodules or masses are noted. No acute consolidative airspace disease. No pleural effusions. Upper Abdomen: Aortic atherosclerosis. Musculoskeletal: There are no aggressive appearing lytic or blastic lesions noted in the visualized portions of the skeleton. IMPRESSION: 1. No findings to suggest interstitial lung disease. 2. Mild diffuse bronchial wall thickening with mild  centrilobular and paraseptal emphysema; imaging findings compatible with the reported clinical history of COPD. 3. Aortic atherosclerosis, in addition to left main and 3 vessel coronary artery disease. 4. There are calcifications of the aortic valve. Echocardiographic correlation for evaluation of potential valvular dysfunction may be warranted if clinically indicated. Aortic Atherosclerosis (ICD10-I70.0) and Emphysema (ICD10-J43.9). Electronically Signed   By: Vinnie Langton M.D.   On: 05/11/2018 14:36     Assessment & Plan:   COPD GOLD III Staging: COPD gold III, mild decrease in FEV1 and FVC/FEV1  ratio since 2015  - PFTs 05/20/18- FVC 1.63 (60%), FEV1  0.77 (42%), Ratio 48 (65%) Unable to complete DLCO or lung volumes d/t patient not being able to perform the proper technique.  - HRCT completed showing no evidence of ILD  - Reports worsening of sob over the past few months to year - NO evidence of exacerbation - Given spacer for symbicort  - Add LAMA , Spiriva respimat 2 puffs daily - Add Vit D supplement daily for prevention of exacerbations   - FU in 2 months with 6 min walk test  - Refer to pulmonary rehab       Martyn Ehrich, NP 05/20/2018

## 2018-05-20 NOTE — Progress Notes (Signed)
PFT completed today.  

## 2018-05-21 MED ORDER — AEROCHAMBER MV MISC: 0 refills | Status: DC

## 2018-05-21 NOTE — Addendum Note (Signed)
Addended by: Karmen Stabs on: 05/21/2018 09:23 AM   Modules accepted: Orders

## 2018-05-23 DIAGNOSIS — I5032 Chronic diastolic (congestive) heart failure: Secondary | ICD-10-CM | POA: Diagnosis not present

## 2018-05-29 ENCOUNTER — Other Ambulatory Visit: Payer: Self-pay | Admitting: Cardiology

## 2018-06-02 ENCOUNTER — Telehealth (HOSPITAL_COMMUNITY): Payer: Self-pay

## 2018-06-02 NOTE — Telephone Encounter (Signed)
Attempted to contact daughter of patient in regards to Pulmonary Rehab - lm on vm

## 2018-06-02 NOTE — Telephone Encounter (Signed)
Called and spoke with patient in regards to Pulmonary Rehab - Patient stated his daughter usually handles his appts and asked me to call back around 4. Will follow up then.

## 2018-06-04 ENCOUNTER — Telehealth (HOSPITAL_COMMUNITY): Payer: Self-pay

## 2018-06-04 NOTE — Telephone Encounter (Signed)
Called and spoke with patient in regards to Pulmonary Rehab - Patient is not interested at this time. Closed referral.

## 2018-06-23 DIAGNOSIS — I5032 Chronic diastolic (congestive) heart failure: Secondary | ICD-10-CM | POA: Diagnosis not present

## 2018-07-10 DIAGNOSIS — J441 Chronic obstructive pulmonary disease with (acute) exacerbation: Secondary | ICD-10-CM | POA: Diagnosis not present

## 2018-07-10 DIAGNOSIS — Z7984 Long term (current) use of oral hypoglycemic drugs: Secondary | ICD-10-CM | POA: Diagnosis not present

## 2018-07-10 DIAGNOSIS — E1159 Type 2 diabetes mellitus with other circulatory complications: Secondary | ICD-10-CM | POA: Diagnosis not present

## 2018-07-14 ENCOUNTER — Other Ambulatory Visit: Payer: Self-pay | Admitting: Family Medicine

## 2018-07-14 ENCOUNTER — Ambulatory Visit
Admission: RE | Admit: 2018-07-14 | Discharge: 2018-07-14 | Disposition: A | Discharge status: Home / Self Care | Payer: Medicare HMO | Source: Ambulatory Visit | Attending: Family Medicine | Admitting: Family Medicine

## 2018-07-14 DIAGNOSIS — R05 Cough: Secondary | ICD-10-CM | POA: Diagnosis not present

## 2018-07-14 DIAGNOSIS — J441 Chronic obstructive pulmonary disease with (acute) exacerbation: Secondary | ICD-10-CM

## 2018-07-20 ENCOUNTER — Ambulatory Visit: Payer: Medicare HMO | Admitting: Primary Care

## 2018-07-20 ENCOUNTER — Ambulatory Visit: Payer: Medicare HMO

## 2018-07-23 DIAGNOSIS — I5032 Chronic diastolic (congestive) heart failure: Secondary | ICD-10-CM | POA: Diagnosis not present

## 2018-07-31 DIAGNOSIS — I2510 Atherosclerotic heart disease of native coronary artery without angina pectoris: Secondary | ICD-10-CM | POA: Diagnosis not present

## 2018-07-31 DIAGNOSIS — E782 Mixed hyperlipidemia: Secondary | ICD-10-CM | POA: Diagnosis not present

## 2018-07-31 DIAGNOSIS — J449 Chronic obstructive pulmonary disease, unspecified: Secondary | ICD-10-CM | POA: Diagnosis not present

## 2018-07-31 DIAGNOSIS — E1159 Type 2 diabetes mellitus with other circulatory complications: Secondary | ICD-10-CM | POA: Diagnosis not present

## 2018-07-31 DIAGNOSIS — Z6836 Body mass index (BMI) 36.0-36.9, adult: Secondary | ICD-10-CM | POA: Diagnosis not present

## 2018-07-31 DIAGNOSIS — E039 Hypothyroidism, unspecified: Secondary | ICD-10-CM | POA: Diagnosis not present

## 2018-07-31 DIAGNOSIS — Z0000 Encounter for general adult medical examination without abnormal findings: Secondary | ICD-10-CM | POA: Diagnosis not present

## 2018-07-31 DIAGNOSIS — I110 Hypertensive heart disease with heart failure: Secondary | ICD-10-CM | POA: Diagnosis not present

## 2018-08-10 ENCOUNTER — Telehealth: Payer: Self-pay

## 2018-08-10 MED ORDER — ROSUVASTATIN CALCIUM 40 MG PO TABS: 40.0000 mg | ORAL_TABLET | Freq: Every day | ORAL | 3 refills | Status: DC

## 2018-08-10 NOTE — Telephone Encounter (Signed)
Patient's daughter walked in to the clinic and said that [facility] to [facility] and he was mistakenly given pravastatin by the pharmacy, order correct in our system.Marland Kitchen Reordered the correct medication and sent to [facility] to correct the confusion.

## 2018-08-11 ENCOUNTER — Telehealth: Payer: Self-pay | Admitting: Cardiology

## 2018-08-11 NOTE — Telephone Encounter (Signed)
New Message   Patient calling the office for samples of medication:   1.  What medication and dosage are you requesting samples for? rosuvastatin (CRESTOR) 40 MG tablet  2.  Are you currently out of this medication? yes

## 2018-08-11 NOTE — Telephone Encounter (Signed)
New Message    Pt c/o medication issue:  1. Name of Medication: rosuvastatin (CRESTOR) 40 MG tablet  2. How are you currently taking this medication (dosage and times per day)? Take 1 tablet (40 mg total) by mouth daily  3. Are you having a reaction (difficulty breathing--STAT)? no  4. What is your medication issue? Pt's daughter is calling, states that the pt's insurance will not cover prescription due to the wrong medication being sent over before and states on Dr. Radford Pax can override it. Please call

## 2018-08-12 NOTE — Telephone Encounter (Addendum)
**Note De-Identified  Obfuscation** The pts daughter had some of the pts medication transferred to [facility] When the pts daughter got the medication home she realized that [facility] gave the pt Pravastatin and not Rosuvastatin which is the statin that the pt takes.  According to the pts chart he has never taken Pravastatin.  I called [facility] [place] and was [facility]. [facility] and was advised that they have in their records that the pt takes Rosuvastatin and they had no record of the pt ever taking Pravastatin.  I called [facility] back and was advised that they would have the head pharmacist, Huy, call me back ASAP this morning as she is the pharmacist that handled this order. They are aware that the pt is completely out of his Rosuvastatin.  I waited until 9:30 this morning without a call from [facility] so I called them.  Huy came to the phone and stated that she does not know what happened but that they will take care of this matter. She states that they will contact the pt to let him know that they are filling his Rosuvastatin at no charge to him since it was not his fault that this happened.     I again reminded Huy that the pt is out of his Rosuvastatin and she assured me that this will be taken care of within the next few hours.

## 2018-08-17 ENCOUNTER — Telehealth: Payer: Self-pay | Admitting: Cardiology

## 2018-08-17 NOTE — Telephone Encounter (Signed)
Okay to continue to refill? Please advise. Thanks, MI 

## 2018-08-17 NOTE — Telephone Encounter (Signed)
°*  STAT* If patient is at the pharmacy, call can be transferred to refill team.   1. Which medications need to be refilled? (please list name of each medication and dose if known)  pantoprazole (PROTONIX) 40 MG tablet  2. Which pharmacy/location (including street and city if local pharmacy) is medication to be sent to?   [facility] [address]   3. Do they need a 30 day or 90 day supply?   30 days

## 2018-08-18 DIAGNOSIS — G4733 Obstructive sleep apnea (adult) (pediatric): Secondary | ICD-10-CM | POA: Diagnosis not present

## 2018-08-18 NOTE — Telephone Encounter (Signed)
Have PCP refill this.

## 2018-08-18 NOTE — Telephone Encounter (Signed)
Spoke with the patient's daughter. She expressed understanding and will reach out to PCP to refill medication.

## 2018-08-23 DIAGNOSIS — I5032 Chronic diastolic (congestive) heart failure: Secondary | ICD-10-CM | POA: Diagnosis not present

## 2018-08-31 ENCOUNTER — Ambulatory Visit
Admission: RE | Admit: 2018-08-31 | Discharge: 2018-08-31 | Disposition: A | Discharge status: Home / Self Care | Payer: Medicare HMO | Source: Ambulatory Visit | Attending: Physician Assistant | Admitting: Physician Assistant

## 2018-08-31 ENCOUNTER — Other Ambulatory Visit: Payer: Self-pay | Admitting: Physician Assistant

## 2018-08-31 DIAGNOSIS — S4991XA Unspecified injury of right shoulder and upper arm, initial encounter: Secondary | ICD-10-CM | POA: Diagnosis not present

## 2018-08-31 DIAGNOSIS — M25511 Pain in right shoulder: Secondary | ICD-10-CM | POA: Diagnosis not present

## 2018-08-31 DIAGNOSIS — W19XXXA Unspecified fall, initial encounter: Secondary | ICD-10-CM

## 2018-09-02 ENCOUNTER — Encounter (HOSPITAL_COMMUNITY): Payer: Self-pay

## 2018-09-02 ENCOUNTER — Other Ambulatory Visit: Payer: Self-pay

## 2018-09-02 ENCOUNTER — Observation Stay (HOSPITAL_COMMUNITY)
Admission: EM | Admit: 2018-09-02 | Discharge: 2018-09-06 | Disposition: A | Discharge status: Home / Self Care | Payer: Medicare HMO | Attending: Internal Medicine | Admitting: Internal Medicine

## 2018-09-02 DIAGNOSIS — Z6833 Body mass index (BMI) 33.0-33.9, adult: Secondary | ICD-10-CM | POA: Insufficient documentation

## 2018-09-02 DIAGNOSIS — K219 Gastro-esophageal reflux disease without esophagitis: Secondary | ICD-10-CM | POA: Insufficient documentation

## 2018-09-02 DIAGNOSIS — Z7951 Long term (current) use of inhaled steroids: Secondary | ICD-10-CM | POA: Insufficient documentation

## 2018-09-02 DIAGNOSIS — Z86718 Personal history of other venous thrombosis and embolism: Secondary | ICD-10-CM | POA: Insufficient documentation

## 2018-09-02 DIAGNOSIS — I739 Peripheral vascular disease, unspecified: Secondary | ICD-10-CM | POA: Insufficient documentation

## 2018-09-02 DIAGNOSIS — I5032 Chronic diastolic (congestive) heart failure: Secondary | ICD-10-CM | POA: Insufficient documentation

## 2018-09-02 DIAGNOSIS — Z7984 Long term (current) use of oral hypoglycemic drugs: Secondary | ICD-10-CM | POA: Insufficient documentation

## 2018-09-02 DIAGNOSIS — K922 Gastrointestinal hemorrhage, unspecified: Principal | ICD-10-CM | POA: Diagnosis present

## 2018-09-02 DIAGNOSIS — D62 Acute posthemorrhagic anemia: Secondary | ICD-10-CM | POA: Diagnosis not present

## 2018-09-02 DIAGNOSIS — R0602 Shortness of breath: Secondary | ICD-10-CM | POA: Diagnosis not present

## 2018-09-02 DIAGNOSIS — G4733 Obstructive sleep apnea (adult) (pediatric): Secondary | ICD-10-CM | POA: Diagnosis present

## 2018-09-02 DIAGNOSIS — I10 Essential (primary) hypertension: Secondary | ICD-10-CM | POA: Diagnosis not present

## 2018-09-02 DIAGNOSIS — E785 Hyperlipidemia, unspecified: Secondary | ICD-10-CM | POA: Insufficient documentation

## 2018-09-02 DIAGNOSIS — Z791 Long term (current) use of non-steroidal anti-inflammatories (NSAID): Secondary | ICD-10-CM | POA: Insufficient documentation

## 2018-09-02 DIAGNOSIS — N289 Disorder of kidney and ureter, unspecified: Secondary | ICD-10-CM

## 2018-09-02 DIAGNOSIS — Z87891 Personal history of nicotine dependence: Secondary | ICD-10-CM | POA: Insufficient documentation

## 2018-09-02 DIAGNOSIS — Z955 Presence of coronary angioplasty implant and graft: Secondary | ICD-10-CM | POA: Insufficient documentation

## 2018-09-02 DIAGNOSIS — E7800 Pure hypercholesterolemia, unspecified: Secondary | ICD-10-CM | POA: Insufficient documentation

## 2018-09-02 DIAGNOSIS — E669 Obesity, unspecified: Secondary | ICD-10-CM | POA: Diagnosis not present

## 2018-09-02 DIAGNOSIS — I4510 Unspecified right bundle-branch block: Secondary | ICD-10-CM | POA: Diagnosis not present

## 2018-09-02 DIAGNOSIS — I110 Hypertensive heart disease with heart failure: Secondary | ICD-10-CM | POA: Diagnosis not present

## 2018-09-02 DIAGNOSIS — M7989 Other specified soft tissue disorders: Secondary | ICD-10-CM | POA: Insufficient documentation

## 2018-09-02 DIAGNOSIS — I160 Hypertensive urgency: Secondary | ICD-10-CM | POA: Insufficient documentation

## 2018-09-02 DIAGNOSIS — Z8249 Family history of ischemic heart disease and other diseases of the circulatory system: Secondary | ICD-10-CM | POA: Insufficient documentation

## 2018-09-02 DIAGNOSIS — Z79890 Hormone replacement therapy (postmenopausal): Secondary | ICD-10-CM | POA: Insufficient documentation

## 2018-09-02 DIAGNOSIS — Z7982 Long term (current) use of aspirin: Secondary | ICD-10-CM | POA: Diagnosis not present

## 2018-09-02 DIAGNOSIS — Z79899 Other long term (current) drug therapy: Secondary | ICD-10-CM | POA: Insufficient documentation

## 2018-09-02 DIAGNOSIS — J449 Chronic obstructive pulmonary disease, unspecified: Secondary | ICD-10-CM | POA: Diagnosis present

## 2018-09-02 DIAGNOSIS — E119 Type 2 diabetes mellitus without complications: Secondary | ICD-10-CM

## 2018-09-02 DIAGNOSIS — I2510 Atherosclerotic heart disease of native coronary artery without angina pectoris: Secondary | ICD-10-CM | POA: Diagnosis not present

## 2018-09-02 DIAGNOSIS — E039 Hypothyroidism, unspecified: Secondary | ICD-10-CM | POA: Insufficient documentation

## 2018-09-02 DIAGNOSIS — R0902 Hypoxemia: Secondary | ICD-10-CM | POA: Diagnosis not present

## 2018-09-02 DIAGNOSIS — E875 Hyperkalemia: Secondary | ICD-10-CM | POA: Diagnosis present

## 2018-09-02 NOTE — ED Triage Notes (Signed)
Per ems: pt coming from home c/o new onset bright red loose bloody stools. 2 episodes 30 mins apart prior to arrival.

## 2018-09-02 NOTE — ED Notes (Signed)
Bed: UO15 Expected date:  Expected time:  Means of arrival:  Comments: 84 yr old rectal bleed

## 2018-09-03 ENCOUNTER — Encounter (HOSPITAL_COMMUNITY): Payer: Self-pay | Admitting: Family Medicine

## 2018-09-03 DIAGNOSIS — I2510 Atherosclerotic heart disease of native coronary artery without angina pectoris: Secondary | ICD-10-CM

## 2018-09-03 DIAGNOSIS — E119 Type 2 diabetes mellitus without complications: Secondary | ICD-10-CM | POA: Diagnosis not present

## 2018-09-03 DIAGNOSIS — D649 Anemia, unspecified: Secondary | ICD-10-CM | POA: Diagnosis not present

## 2018-09-03 DIAGNOSIS — K922 Gastrointestinal hemorrhage, unspecified: Secondary | ICD-10-CM | POA: Diagnosis not present

## 2018-09-03 DIAGNOSIS — J449 Chronic obstructive pulmonary disease, unspecified: Secondary | ICD-10-CM | POA: Diagnosis not present

## 2018-09-03 DIAGNOSIS — I10 Essential (primary) hypertension: Secondary | ICD-10-CM | POA: Diagnosis not present

## 2018-09-03 DIAGNOSIS — K921 Melena: Secondary | ICD-10-CM | POA: Diagnosis not present

## 2018-09-03 DIAGNOSIS — I160 Hypertensive urgency: Secondary | ICD-10-CM | POA: Diagnosis not present

## 2018-09-03 DIAGNOSIS — K5731 Diverticulosis of large intestine without perforation or abscess with bleeding: Secondary | ICD-10-CM | POA: Diagnosis not present

## 2018-09-03 DIAGNOSIS — E875 Hyperkalemia: Secondary | ICD-10-CM | POA: Diagnosis not present

## 2018-09-03 LAB — GLUCOSE, CAPILLARY
GLUCOSE-CAPILLARY: 80 mg/dL (ref 70–99)
GLUCOSE-CAPILLARY: 84 mg/dL (ref 70–99)
GLUCOSE-CAPILLARY: 99 mg/dL (ref 70–99)
Glucose-Capillary: 112 mg/dL — ABNORMAL HIGH (ref 70–99)

## 2018-09-03 LAB — CBG MONITORING, ED
GLUCOSE-CAPILLARY: 129 mg/dL — AB (ref 70–99)
Glucose-Capillary: 118 mg/dL — ABNORMAL HIGH (ref 70–99)
Glucose-Capillary: 122 mg/dL — ABNORMAL HIGH (ref 70–99)

## 2018-09-03 LAB — CBC WITH DIFFERENTIAL/PLATELET
Abs Immature Granulocytes: 0.02 10*3/uL (ref 0.00–0.07)
Basophils Absolute: 0 10*3/uL (ref 0.0–0.1)
Basophils Relative: 1 %
EOS ABS: 0.2 10*3/uL (ref 0.0–0.5)
EOS PCT: 3 %
HCT: 43.7 % (ref 39.0–52.0)
HEMOGLOBIN: 13.8 g/dL (ref 13.0–17.0)
Immature Granulocytes: 0 %
LYMPHS PCT: 26 %
Lymphs Abs: 2 10*3/uL (ref 0.7–4.0)
MCH: 29.4 pg (ref 26.0–34.0)
MCHC: 31.6 g/dL (ref 30.0–36.0)
MCV: 93 fL (ref 80.0–100.0)
MONO ABS: 0.6 10*3/uL (ref 0.1–1.0)
Monocytes Relative: 8 %
Neutro Abs: 4.9 10*3/uL (ref 1.7–7.7)
Neutrophils Relative %: 62 %
Platelets: 165 10*3/uL (ref 150–400)
RBC: 4.7 MIL/uL (ref 4.22–5.81)
RDW: 13.5 % (ref 11.5–15.5)
WBC: 7.6 10*3/uL (ref 4.0–10.5)
nRBC: 0 % (ref 0.0–0.2)

## 2018-09-03 LAB — HEMATOCRIT
HCT: 40.6 % (ref 39.0–52.0)
HCT: 43.8 % (ref 39.0–52.0)
HCT: 43.8 % (ref 39.0–52.0)

## 2018-09-03 LAB — BASIC METABOLIC PANEL
Anion gap: 7 (ref 5–15)
Anion gap: 9 (ref 5–15)
BUN: 18 mg/dL (ref 8–23)
BUN: 21 mg/dL (ref 8–23)
CALCIUM: 9.1 mg/dL (ref 8.9–10.3)
CHLORIDE: 100 mmol/L (ref 98–111)
CO2: 29 mmol/L (ref 22–32)
CO2: 29 mmol/L (ref 22–32)
CREATININE: 0.98 mg/dL (ref 0.61–1.24)
CREATININE: 1.32 mg/dL — AB (ref 0.61–1.24)
Calcium: 8.5 mg/dL — ABNORMAL LOW (ref 8.9–10.3)
Chloride: 105 mmol/L (ref 98–111)
GFR calc Af Amer: 55 mL/min — ABNORMAL LOW (ref >60)
GFR calc non Af Amer: 47 mL/min — ABNORMAL LOW (ref >60)
Glucose, Bld: 146 mg/dL — ABNORMAL HIGH (ref 70–99)
Glucose, Bld: 153 mg/dL — ABNORMAL HIGH (ref 70–99)
Potassium: 3.7 mmol/L (ref 3.5–5.1)
Potassium: 5.8 mmol/L — ABNORMAL HIGH (ref 3.5–5.1)
SODIUM: 138 mmol/L (ref 135–145)
SODIUM: 141 mmol/L (ref 135–145)

## 2018-09-03 LAB — HEMOGLOBIN
HEMOGLOBIN: 12.6 g/dL — AB (ref 13.0–17.0)
HEMOGLOBIN: 13.7 g/dL (ref 13.0–17.0)
Hemoglobin: 13.5 g/dL (ref 13.0–17.0)

## 2018-09-03 LAB — TYPE AND SCREEN
ABO/RH(D): AB POS
Antibody Screen: NEGATIVE

## 2018-09-03 LAB — PROTIME-INR
INR: 0.97
Prothrombin Time: 12.8 seconds (ref 11.4–15.2)

## 2018-09-03 LAB — APTT: APTT: 30 s (ref 24–36)

## 2018-09-03 MED ORDER — SODIUM CHLORIDE 0.9% FLUSH
3.0000 mL | Freq: Two times a day (BID) | INTRAVENOUS | Status: DC
  Administered 2018-09-03 – 2018-09-06 (×5): 3 mL via INTRAVENOUS

## 2018-09-03 MED ORDER — SODIUM CHLORIDE 0.9 % IV SOLN
Freq: Once | INTRAVENOUS | Status: AC
  Administered 2018-09-03: 01:00:00 via INTRAVENOUS

## 2018-09-03 MED ORDER — ACETAMINOPHEN 650 MG RE SUPP: 650.0000 mg | Freq: Four times a day (QID) | RECTAL | Status: DC | PRN

## 2018-09-03 MED ORDER — LEVOTHYROXINE SODIUM 150 MCG PO TABS
150.0000 ug | ORAL_TABLET | Freq: Every day | ORAL | Status: DC
  Administered 2018-09-04 – 2018-09-06 (×3): 150 ug via ORAL
  Filled 2018-09-03 (×3): qty 1

## 2018-09-03 MED ORDER — SODIUM CHLORIDE 0.9 % IV BOLUS
500.0000 mL | Freq: Once | INTRAVENOUS | Status: AC
  Administered 2018-09-03: 500 mL via INTRAVENOUS

## 2018-09-03 MED ORDER — ONDANSETRON HCL 4 MG/2ML IJ SOLN: 4.0000 mg | Freq: Four times a day (QID) | INTRAMUSCULAR | Status: DC | PRN

## 2018-09-03 MED ORDER — ACETAMINOPHEN 325 MG PO TABS
650.0000 mg | ORAL_TABLET | Freq: Four times a day (QID) | ORAL | Status: DC | PRN
  Administered 2018-09-04: 650 mg via ORAL
  Filled 2018-09-03: qty 2

## 2018-09-03 MED ORDER — HYDRALAZINE HCL 20 MG/ML IJ SOLN: 5.0000 mg | INTRAMUSCULAR | Status: DC | PRN

## 2018-09-03 MED ORDER — INSULIN ASPART 100 UNIT/ML ~~LOC~~ SOLN
0.0000 [IU] | SUBCUTANEOUS | Status: DC
  Administered 2018-09-04 (×2): 1 [IU] via SUBCUTANEOUS
  Administered 2018-09-05: 2 [IU] via SUBCUTANEOUS

## 2018-09-03 MED ORDER — METOPROLOL SUCCINATE ER 50 MG PO TB24
50.0000 mg | ORAL_TABLET | Freq: Every day | ORAL | Status: DC
  Administered 2018-09-03 – 2018-09-06 (×4): 50 mg via ORAL
  Filled 2018-09-03 (×4): qty 1

## 2018-09-03 MED ORDER — EZETIMIBE 10 MG PO TABS
10.0000 mg | ORAL_TABLET | Freq: Every day | ORAL | Status: DC
  Administered 2018-09-03 – 2018-09-05 (×3): 10 mg via ORAL
  Filled 2018-09-03 (×6): qty 1

## 2018-09-03 MED ORDER — SODIUM CHLORIDE 0.9 % IV SOLN
INTRAVENOUS | Status: DC
  Administered 2018-09-03: 02:00:00 via INTRAVENOUS

## 2018-09-03 MED ORDER — MOMETASONE FURO-FORMOTEROL FUM 200-5 MCG/ACT IN AERO
2.0000 | INHALATION_SPRAY | Freq: Two times a day (BID) | RESPIRATORY_TRACT | Status: DC
  Administered 2018-09-03 – 2018-09-06 (×7): 2 via RESPIRATORY_TRACT
  Filled 2018-09-03: qty 8.8

## 2018-09-03 MED ORDER — ALBUTEROL SULFATE (2.5 MG/3ML) 0.083% IN NEBU: 2.5000 mg | INHALATION_SOLUTION | RESPIRATORY_TRACT | Status: DC | PRN

## 2018-09-03 MED ORDER — ROSUVASTATIN CALCIUM 10 MG PO TABS
40.0000 mg | ORAL_TABLET | Freq: Every day | ORAL | Status: DC
  Administered 2018-09-03 – 2018-09-05 (×2): 40 mg via ORAL
  Filled 2018-09-03 (×2): qty 4
  Filled 2018-09-03: qty 1
  Filled 2018-09-03: qty 4

## 2018-09-03 MED ORDER — SODIUM CHLORIDE 0.9 % IV SOLN
INTRAVENOUS | Status: DC
  Administered 2018-09-03 – 2018-09-04 (×2): via INTRAVENOUS

## 2018-09-03 MED ORDER — ONDANSETRON HCL 4 MG PO TABS: 4.0000 mg | ORAL_TABLET | Freq: Four times a day (QID) | ORAL | Status: DC | PRN

## 2018-09-03 NOTE — ED Provider Notes (Signed)
[facility] DEPT Provider Note: Georgena Spurling, MD, FACEP  CSN: [id number] MRN: [id number] ARRIVAL: 09/02/18 at 2338 ROOM: [place]  Rectal Bleeding   HISTORY OF PRESENT ILLNESS  09/03/18 12:04 AM [name] is a 84 y.o. male who felt the sudden urge to have a bowel movement about 2 hours prior to arrival.  The bowel movement consisted of bright red blood and clots without stool mixed in.  He describes the quantity as "a lot".  He had a second episode about 30 minutes later and a third episode after arrival here.  He denies lightheadedness, chest pain, shortness of breath beyond his baseline dyspnea on exertion, abdominal pain or rectal pain.  He is not on any anticoagulant.  Nothing makes the symptoms better or worse.   Past Medical History:  Diagnosis Date  . Anxiety   . Asthma   . Chronic diastolic CHF (congestive heart failure) (HCC)    diastolic   . COPD (chronic obstructive pulmonary disease) ([place])   . Coronary artery disease    s/p PCI of RCA 03/2009 at which time there was 70% ramus and 90% RCA, cath 01/2011 showed patent stents in RCA with moderate prox disesae, aneurysmal left circ and small 90% ramus s/p PCI, patent LAD EF 55%  . Diabetes mellitus   . Diverticulitis Oct. 2013   bleeding in the past and Effient for his CAD was stopped  . DVT (deep venous thrombosis) ([place])   . GERD (gastroesophageal reflux disease)   . Hypercholesterolemia   . Hypertension   . Hypothyroidism   . Obesity (BMI 30-39.9)   . OSA (obstructive sleep apnea)    severe on CPAP  . Peripheral vascular disease ([place]) 11/2006   s/p left stent  . Shortness of breath   . Sleep apnea    severe OSA awaiting CPAP titration    Past Surgical History:  Procedure Laterality Date  . ABDOMINAL AORTAGRAM N/A 04/20/2012   Procedure: ABDOMINAL Maxcine Ham;  Surgeon: Angelia Mould, MD;  Location: [facility] CATH LAB;  Service: Cardiovascular;  Laterality: N/A;  . ABDOMINAL AORTAGRAM  N/A 12/29/2012   Procedure: ABDOMINAL Maxcine Ham;  Surgeon: Serafina Mitchell, MD;  Location: [facility] CATH LAB;  Service: Cardiovascular;  Laterality: N/A;  . BALLOON DILATION  12/26/2011   Procedure: BALLOON DILATION;  Surgeon: Garlan Fair, MD;  Location: [facility] ENDOSCOPY;  Service: Endoscopy;  Laterality: N/A;  . COLONOSCOPY  08/14/2012   Procedure: COLONOSCOPY;  Surgeon: Arta Silence, MD;  Location: [facility] ENDOSCOPY;  Service: Endoscopy;  Laterality: N/A;  . CORONARY STENT PLACEMENT  2010 and 2012  . ESOPHAGOGASTRODUODENOSCOPY  12/26/2011   Procedure: ESOPHAGOGASTRODUODENOSCOPY (EGD);  Surgeon: Garlan Fair, MD;  Location: [facility] ENDOSCOPY;  Service: Endoscopy;  Laterality: N/A;  . ESOPHAGOGASTRODUODENOSCOPY  08/13/2012   Procedure: ESOPHAGOGASTRODUODENOSCOPY (EGD);  Surgeon: Arta Silence, MD;  Location: [facility] ENDOSCOPY;  Service: Endoscopy;  Laterality: Left;  . HERNIA REPAIR  1992   evntral hernia repair  . LOWER EXTREMITY ANGIOGRAM Bilateral 12/29/2012   Procedure: LOWER EXTREMITY ANGIOGRAM;  Surgeon: Serafina Mitchell, MD;  Location: [facility] CATH LAB;  Service: Cardiovascular;  Laterality: Bilateral;  bilat lower extrem angio    Family History  Problem Relation Age of Onset  . Diabetes Mother   . Hyperlipidemia Mother   . Hypertension Mother   . Heart attack Mother   . Heart disease Mother        before age 62  . Diabetes Brother   . Heart  disease Father        before age 90  . Breast cancer Sister   . Diabetes Sister   . Hyperlipidemia Sister   . Hypertension Sister   . Heart attack Sister   . Hyperlipidemia Sister   . Hypertension Sister   . Heart disease Sister        Heart Disease before age 28  . Asthma Sister   . Malignant hyperthermia Neg Hx     Social History   Tobacco Use  . Smoking status: Former Smoker    Packs/day: 0.50    Years: 15.00    Pack years: 7.50    Types: Cigarettes, Cigars    Last attempt to quit: 10/15/1963    Years since quitting: 54.9  . Smokeless tobacco:  Never Used  Substance Use Topics  . Alcohol use: No  . Drug use: No    Prior to Admission medications   Medication Sig Start Date End Date Taking? Authorizing Provider  albuterol (PROVENTIL HFA;VENTOLIN HFA) 108 (90 BASE) MCG/ACT inhaler Inhale 2 puffs into the lungs every 6 (six) hours as needed for wheezing or shortness of breath.    Yes [provider]  albuterol (PROVENTIL) (2.5 MG/3ML) 0.083% nebulizer solution Take 3 mLs (2.5 mg total) by nebulization every 4 (four) hours as needed for wheezing or shortness of breath. 09/25/16  Yes Patrecia Pour, MD  aspirin EC 81 MG tablet Take 81 mg by mouth daily. At 5 pm   Yes [provider]  budesonide-formoterol (SYMBICORT) 160-4.5 MCG/ACT inhaler Inhale 2 puffs into the lungs 2 (two) times daily.   Yes [provider]  ezetimibe (ZETIA) 10 MG tablet Take 10 mg by mouth daily. 03/31/18  Yes [provider]  glimepiride (AMARYL) 2 MG tablet Take 2 mg by mouth daily. At 5 pm   Yes [provider]  levothyroxine (SYNTHROID, LEVOTHROID) 150 MCG tablet Take 150 mcg by mouth daily before breakfast. 05/19/17  Yes [provider]  meloxicam (MOBIC) 15 MG tablet Take 15 mg by mouth daily. 10/22/17  Yes [provider]  metaxalone (SKELAXIN) 800 MG tablet Take 800-1,600 mg by mouth 2 (two) times daily.   Yes [provider]  metoprolol succinate (TOPROL-XL) 50 MG 24 hr tablet TAKE 1 TABLET BY MOUTH EVERY DAY 05/29/18  Yes Turner, Eber Hong, MD  nitroGLYCERIN (NITROSTAT) 0.4 MG SL tablet Place 0.4 mg under the tongue every 5 (five) minutes as needed for chest pain.   Yes [provider]  pantoprazole (PROTONIX) 40 MG tablet TAKE 1 TABLET (40 MG TOTAL) BY MOUTH 2 (TWO) TIMES DAILY. Patient taking differently: Take 40 mg by mouth 2 (two) times daily.  07/29/17  Yes Turner, Eber Hong, MD  rosuvastatin (CRESTOR) 40 MG tablet Take 1 tablet (40 mg total) by mouth daily. 08/10/18 08/10/19 Yes  Sueanne Margarita, MD  Spacer/Aero-Holding Chambers (AEROCHAMBER MV) inhaler Use as instructed 05/21/18   Martyn Ehrich, NP    Allergies Clopidogrel   REVIEW OF SYSTEMS  Negative except as noted here or in the History of Present Illness.   PHYSICAL EXAMINATION  Initial Vital Signs Blood pressure 114/79, pulse 93, temperature 98.6 F (37 C), temperature source Oral, resp. rate 18, height 5\' 5"  (1.651 m), weight 90.7 kg, SpO2 94 %.  Examination General: Well-developed, well-nourished male in no acute distress; appearance consistent with age of record HENT: normocephalic; atraumatic Eyes: pupils equal, round and reactive to light; extraocular muscles intact; arcus senilis bilaterally  Neck: supple Heart: regular rate and rhythm Lungs: clear to auscultation bilaterally Abdomen: soft; nondistended; nontender; bowel sounds present Rectal: Bright red blood Extremities: No deformity; full range of motion; pulses normal Neurologic: Awake, alert and oriented; motor function intact in all extremities and symmetric; no facial droop Skin: Warm and dry Psychiatric: Normal mood and affect   RESULTS  Summary of this visit's results, reviewed by myself:   EKG Interpretation  Date/Time:  Thursday September 03 2018 03:00:04 EST Ventricular Rate:  87 PR Interval:    QRS Duration: 134 QT Interval:  402 QTC Calculation: 484 R Axis:   -33 Text Interpretation:  Sinus rhythm Right bundle branch block RBBB not seen previously Confirmed by Tavari Loadholt [id number]) on 09/03/2018 3:03:58 AM      Laboratory Studies: Results for orders placed or performed during the hospital encounter of 09/02/18 (from the past 24 hour(s))  Basic metabolic panel     Status: Abnormal   Collection Time: 09/03/18 12:29 AM  Result Value Ref Range   Sodium 138 135 - 145 mmol/L   Potassium 5.8 (H) 3.5 - 5.1 mmol/L   Chloride 100 98 - 111 mmol/L   CO2 29 22 - 32 mmol/L   Glucose, Bld 146 (H) 70 - 99 mg/dL   BUN 21 8 -  23 mg/dL   Creatinine, Ser 1.32 (H) 0.61 - 1.24 mg/dL   Calcium 9.1 8.9 - 10.3 mg/dL   GFR calc non Af Amer 47 (L) >60 mL/min   GFR calc Af Amer 55 (L) >60 mL/min   Anion gap 9 5 - 15  CBC with Differential/Platelet     Status: None   Collection Time: 09/03/18 12:29 AM  Result Value Ref Range   WBC 7.6 4.0 - 10.5 K/uL   RBC 4.70 4.22 - 5.81 MIL/uL   Hemoglobin 13.8 13.0 - 17.0 g/dL   HCT 43.7 39.0 - 52.0 %   MCV 93.0 80.0 - 100.0 fL   MCH 29.4 26.0 - 34.0 pg   MCHC 31.6 30.0 - 36.0 g/dL   RDW 13.5 11.5 - 15.5 %   Platelets 165 150 - 400 K/uL   nRBC 0.0 0.0 - 0.2 %   Neutrophils Relative % 62 %   Neutro Abs 4.9 1.7 - 7.7 K/uL   Lymphocytes Relative 26 %   Lymphs Abs 2.0 0.7 - 4.0 K/uL   Monocytes Relative 8 %   Monocytes Absolute 0.6 0.1 - 1.0 K/uL   Eosinophils Relative 3 %   Eosinophils Absolute 0.2 0.0 - 0.5 K/uL   Basophils Relative 1 %   Basophils Absolute 0.0 0.0 - 0.1 K/uL   Immature Granulocytes 0 %   Abs Immature Granulocytes 0.02 0.00 - 0.07 K/uL  Protime-INR     Status: None   Collection Time: 09/03/18 12:29 AM  Result Value Ref Range   Prothrombin Time 12.8 11.4 - 15.2 seconds   INR 0.97   APTT     Status: None   Collection Time: 09/03/18 12:29 AM  Result Value Ref Range   aPTT 30 24 - 36 seconds  Type and screen     Status: None   Collection Time: 09/03/18 12:30 AM  Result Value Ref Range   ABO/RH(D) AB POS    Antibody Screen NEG    Sample Expiration      09/06/2018 Performed at [facility], [place] [address], [place], [place] [address]   CBG monitoring, ED     Status: Abnormal   Collection  Time: 09/03/18  2:36 AM  Result Value Ref Range   Glucose-Capillary 118 (H) 70 - 99 mg/dL   Imaging Studies: No results found.  ED COURSE and MDM  Nursing notes and initial vitals signs, including pulse oximetry, reviewed.  Vitals:   09/03/18 0004 09/03/18 0030 09/03/18 0100 09/03/18 0200  BP: 114/79 (!) 136/93 (!) 135/105 (!) 157/111    Pulse: 93 91 86 76  Resp: 18 (!) 21 20 16   Temp: 98.6 F (37 C)     TempSrc: Oral     SpO2: 94% 93% 97% 99%  Weight: 90.7 kg     Height: 5\' 5"  (1.651 m)      1:24 AM Patient states he has been passing blood about every 30 minutes while in the ED.  He still is without pain but does feels weak.  We will have him admitted for further evaluation and treatment.  PROCEDURES    ED DIAGNOSES     ICD-10-CM   1. Lower GI bleed K92.2        Hydia Copelin, MD 09/03/18 [phone]

## 2018-09-03 NOTE — ED Notes (Signed)
Pt had another episode of bloody stool.  

## 2018-09-03 NOTE — Consult Note (Signed)
[facility] Consultation Note  Referring Provider:  Dr. Niel Hummer Primary Care Physician:  [name], MD  Reason for Consultation:  hematochezia  HPI: [name] is a 84 y.o. male presenting with bright red hematochezia with clots.  Started yesterday.  Prior presentation in 2013, felt to be diverticular in nature; had endoscopy and colonoscopy during that admission.  No abdominal pain, hematemesis, melena.  No anticoagulants, but takes meloxicam.  Last bloody stool few hours ago.   Past Medical History:  Diagnosis Date  . Anxiety   . Asthma   . Chronic diastolic CHF (congestive heart failure) (HCC)    diastolic   . COPD (chronic obstructive pulmonary disease) ([place])   . Coronary artery disease    s/p PCI of RCA 03/2009 at which time there was 70% ramus and 90% RCA, cath 01/2011 showed patent stents in RCA with moderate prox disesae, aneurysmal left circ and small 90% ramus s/p PCI, patent LAD EF 55%  . Diabetes mellitus   . Diverticulitis Oct. 2013   bleeding in the past and Effient for his CAD was stopped  . DVT (deep venous thrombosis) ([place])   . GERD (gastroesophageal reflux disease)   . Hypercholesterolemia   . Hypertension   . Hypothyroidism   . Obesity (BMI 30-39.9)   . OSA (obstructive sleep apnea)    severe on CPAP  . Peripheral vascular disease ([place]) 11/2006   s/p left stent  . Shortness of breath   . Sleep apnea    severe OSA awaiting CPAP titration    Past Surgical History:  Procedure Laterality Date  . ABDOMINAL AORTAGRAM N/A 04/20/2012   Procedure: ABDOMINAL Maxcine Ham;  Surgeon: Angelia Mould, MD;  Location[facility] CATH LAB;  Service: Cardiovascular;  Laterality: N/A;  . ABDOMINAL AORTAGRAM N/A 12/29/2012   Procedure: ABDOMINAL Maxcine Ham;  Surgeon: Serafina Mitchell, MD;  Location: [facility] CATH LAB;  Service: Cardiovascular;  Laterality: N/A;  . BALLOON DILATION  12/26/2011   Procedure: BALLOON DILATION;  Surgeon: Garlan Fair, MD;   Location: [facility] ENDOSCOPY;  Service: Endoscopy;  Laterality: N/A;  . COLONOSCOPY  08/14/2012   Procedure: COLONOSCOPY;  Surgeon: Arta Silence, MD;  Location: [facility] ENDOSCOPY;  Service: Endoscopy;  Laterality: N/A;  . CORONARY STENT PLACEMENT  2010 and 2012  . ESOPHAGOGASTRODUODENOSCOPY  12/26/2011   Procedure: ESOPHAGOGASTRODUODENOSCOPY (EGD);  Surgeon: Garlan Fair, MD;  Location: [facility] ENDOSCOPY;  Service: Endoscopy;  Laterality: N/A;  . ESOPHAGOGASTRODUODENOSCOPY  08/13/2012   Procedure: ESOPHAGOGASTRODUODENOSCOPY (EGD);  Surgeon: Arta Silence, MD;  Location: [facility] ENDOSCOPY;  Service: Endoscopy;  Laterality: Left;  . HERNIA REPAIR  1992   evntral hernia repair  . LOWER EXTREMITY ANGIOGRAM Bilateral 12/29/2012   Procedure: LOWER EXTREMITY ANGIOGRAM;  Surgeon: Serafina Mitchell, MD;  Location: [facility] CATH LAB;  Service: Cardiovascular;  Laterality: Bilateral;  bilat lower extrem angio    Prior to Admission medications   Medication Sig Start Date End Date Taking? Authorizing Provider  albuterol (PROVENTIL HFA;VENTOLIN HFA) 108 (90 BASE) MCG/ACT inhaler Inhale 2 puffs into the lungs every 6 (six) hours as needed for wheezing or shortness of breath.    Yes [provider]  albuterol (PROVENTIL) (2.5 MG/3ML) 0.083% nebulizer solution Take 3 mLs (2.5 mg total) by nebulization every 4 (four) hours as needed for wheezing or shortness of breath. 09/25/16  Yes Patrecia Pour, MD  aspirin EC 81 MG tablet Take 81 mg by mouth daily. At 5 pm   Yes [provider]  budesonide-formoterol (  SYMBICORT) 160-4.5 MCG/ACT inhaler Inhale 2 puffs into the lungs 2 (two) times daily.   Yes [provider]  ezetimibe (ZETIA) 10 MG tablet Take 10 mg by mouth daily. 03/31/18  Yes [provider]  glimepiride (AMARYL) 2 MG tablet Take 2 mg by mouth daily. At 5 pm   Yes [provider]  levothyroxine (SYNTHROID, LEVOTHROID) 150 MCG tablet Take 150 mcg by mouth daily before breakfast. 05/19/17   Yes [provider]  meloxicam (MOBIC) 15 MG tablet Take 15 mg by mouth daily. 10/22/17  Yes [provider]  metaxalone (SKELAXIN) 800 MG tablet Take 800-1,600 mg by mouth 2 (two) times daily.   Yes [provider]  metoprolol succinate (TOPROL-XL) 50 MG 24 hr tablet TAKE 1 TABLET BY MOUTH EVERY DAY 05/29/18  Yes Turner, Eber Hong, MD  nitroGLYCERIN (NITROSTAT) 0.4 MG SL tablet Place 0.4 mg under the tongue every 5 (five) minutes as needed for chest pain.   Yes [provider]  pantoprazole (PROTONIX) 40 MG tablet TAKE 1 TABLET (40 MG TOTAL) BY MOUTH 2 (TWO) TIMES DAILY. Patient taking differently: Take 40 mg by mouth 2 (two) times daily.  07/29/17  Yes Turner, Eber Hong, MD  rosuvastatin (CRESTOR) 40 MG tablet Take 1 tablet (40 mg total) by mouth daily. 08/10/18 08/10/19 Yes Sueanne Margarita, MD  Spacer/Aero-Holding Chambers (AEROCHAMBER MV) inhaler Use as instructed 05/21/18   Martyn Ehrich, NP    Current Facility-Administered Medications  Medication Dose Route Frequency Provider Last Rate Last Dose  . 0.9 %  sodium chloride infusion   Intravenous Continuous Regalado, Belkys A, MD 50 mL/hr at 09/03/18 0915    . acetaminophen (TYLENOL) tablet 650 mg  650 mg Oral Q6H PRN Opyd, Ilene Qua, MD       Or  . acetaminophen (TYLENOL) suppository 650 mg  650 mg Rectal Q6H PRN Opyd, Ilene Qua, MD      . albuterol (PROVENTIL) (2.5 MG/3ML) 0.083% nebulizer solution 2.5 mg  2.5 mg Nebulization Q4H PRN Opyd, Ilene Qua, MD      . ezetimibe (ZETIA) tablet 10 mg  10 mg Oral Daily Regalado, Belkys A, MD   10 mg at 09/03/18 1049  . hydrALAZINE (APRESOLINE) injection 5 mg  5 mg Intravenous Q4H PRN Opyd, Ilene Qua, MD      . insulin aspart (novoLOG) injection 0-9 Units  0-9 Units Subcutaneous Q4H Opyd, Ilene Qua, MD      . Derrill Memo ON 09/04/2018] levothyroxine (SYNTHROID, LEVOTHROID) tablet 150 mcg  150 mcg Oral QAC breakfast Regalado, Belkys A, MD      . metoprolol succinate  (TOPROL-XL) 24 hr tablet 50 mg  50 mg Oral Daily Regalado, Belkys A, MD   50 mg at 09/03/18 1049  . mometasone-formoterol (DULERA) 200-5 MCG/ACT inhaler 2 puff  2 puff Inhalation BID Opyd, Ilene Qua, MD   2 puff at 09/03/18 1047  . ondansetron (ZOFRAN) tablet 4 mg  4 mg Oral Q6H PRN Opyd, Ilene Qua, MD       Or  . ondansetron (ZOFRAN) injection 4 mg  4 mg Intravenous Q6H PRN Opyd, Ilene Qua, MD      . rosuvastatin (CRESTOR) tablet 40 mg  40 mg Oral q1800 Regalado, Belkys A, MD      . sodium chloride flush (NS) 0.9 % injection 3 mL  3 mL Intravenous Q12H Opyd, Ilene Qua, MD   3 mL at 09/03/18 1043    Allergies as of 09/02/2018 - Review Complete  09/02/2018  Allergen Reaction Noted  . Clopidogrel Itching 09/22/2016    Family History  Problem Relation Age of Onset  . Diabetes Mother   . Hyperlipidemia Mother   . Hypertension Mother   . Heart attack Mother   . Heart disease Mother        before age 59  . Diabetes Brother   . Heart disease Father        before age 1  . Breast cancer Sister   . Diabetes Sister   . Hyperlipidemia Sister   . Hypertension Sister   . Heart attack Sister   . Hyperlipidemia Sister   . Hypertension Sister   . Heart disease Sister        Heart Disease before age 35  . Asthma Sister   . Malignant hyperthermia Neg Hx     Social History   Socioeconomic History  . Marital status: Widowed    Spouse name: Not on file  . Number of children: Not on file  . Years of education: Not on file  . Highest education level: Not on file  Occupational History  . Not on file  Social Needs  . Financial resource strain: Not on file  . Food insecurity:    Worry: Not on file    Inability: Not on file  . Transportation needs:    Medical: Not on file    Non-medical: Not on file  Tobacco Use  . Smoking status: Former Smoker    Packs/day: 0.50    Years: 15.00    Pack years: 7.50    Types: Cigarettes, Cigars    Last attempt to quit: 10/15/1963    Years since  quitting: 54.9  . Smokeless tobacco: Never Used  Substance and Sexual Activity  . Alcohol use: No  . Drug use: No  . Sexual activity: Never  Lifestyle  . Physical activity:    Days per week: Not on file    Minutes per session: Not on file  . Stress: Not on file  Relationships  . Social connections:    Talks on phone: Not on file    Gets together: Not on file    Attends religious service: Not on file    Active member of club or organization: Not on file    Attends meetings of clubs or organizations: Not on file    Relationship status: Not on file  . Intimate partner violence:    Fear of current or ex partner: Not on file    Emotionally abused: Not on file    Physically abused: Not on file    Forced sexual activity: Not on file  Other Topics Concern  . Not on file  Social History Narrative  . Not on file    Review of Systems: as per HPI, all others negative  Physical Exam: Vital signs in last 24 hours: Temp:  [97.9 F (36.6 C)-98.6 F (37 C)] 97.9 F (36.6 C) (11/21 1311) Pulse Rate:  [63-93] 69 (11/21 1311) Resp:  [11-25] 20 (11/21 1311) BP: (114-180)/(76-111) 163/76 (11/21 1311) SpO2:  [89 %-100 %] 94 % (11/21 1311) Weight:  [90.7 kg] 90.7 kg (11/21 0004)   General:   Alert, overweight, much older-appearing than dated age, Well-developed, well-nourished, pleasant and cooperative in NAD Head:  Normocephalic and atraumatic. Eyes:  Sclera clear, no icterus.   Conjunctiva pink. Ears:  Normal auditory acuity. Nose:  No deformity, discharge,  or lesions. Mouth:  No deformity or lesions.  Oropharynx pink & moist.  Neck:  Supple; no masses or thyromegaly. Lungs:  Clear throughout to auscultation.   No wheezes, crackles, or rhonchi. No acute distress. Heart:  Regular rate and rhythm; no murmurs, clicks, rubs,  or gallops. Abdomen:  Soft, nontender and nondistended. No masses, hepatosplenomegaly or hernias noted. Normal bowel sounds, without guarding, and without rebound.      Msk:  Symmetrical without gross deformities. Normal posture. Pulses:  Normal pulses noted. Extremities:  Without clubbing or edema. Neurologic:  Alert and  oriented x4;  grossly normal neurologically. Skin:  Intact without significant lesions or rashes. Psych:  Alert and cooperative. Normal mood and affect.   Lab Results: Recent Labs    09/03/18 0029 09/03/18 0533 09/03/18 1347  WBC 7.6  --   --   HGB 13.8 12.6* 13.7  HCT 43.7 40.6 43.8  PLT 165  --   --    BMET Recent Labs    09/03/18 0029 09/03/18 0533  NA 138 141  K 5.8* 3.7  CL 100 105  CO2 29 29  GLUCOSE 146* 153*  BUN 21 18  CREATININE 1.32* 0.98  CALCIUM 9.1 8.5*   LFT No results for input(s): PROT, ALBUMIN, AST, ALT, ALKPHOS, BILITOT, BILIDIR, IBILI in the last 72 hours. PT/INR Recent Labs    09/03/18 0029  LABPROT 12.8  INR 0.97    Studies/Results: No results found.  Impression:  1.  Hematochezia.  Suspect diverticular bleeding.  Had similar presentation in 2013 with endoscopy and colonoscopy done at that time. 2.  Acute blood loss anemia, mild.  Plan:  1.  Clear liquid diet. 2.  Able to hold use of meloxicam in future? 3.  If recurrent bleeding, consider tagged RBC study; otherwise, would monitor expectantly (IVF, serial CBCs). 4.  [facility] will revisit tomorrow.   LOS: 0 days   Romonda Parker M  09/03/2018, 3:35 PM  Cell [phone] If no answer or after 5 PM call [phone]

## 2018-09-03 NOTE — ED Notes (Signed)
Admitting, MD at bedside.  

## 2018-09-03 NOTE — H&P (Signed)
History and Physical    [name] VOZ:[id number] DOB: 04/17/1930 DOA: 09/02/2018  PCP: [name], MD   Patient coming from: Home   Chief Complaint: Rectal bleeding  HPI: [name] is a 84 y.o. male with medical history significant for hypothyroidism, type 2 diabetes mellitus, COPD, hypertension, hyperlipidemia, and coronary artery disease, presenting to the emergency department for evaluation of rectal bleeding.  The patient reports that he been in his usual state of health, was having an uneventful day, and then experienced a need to defecate but passed mainly bright red blood.  He had another episode approximately 30 minutes later and then came into the ED.  He denies any pain associated with this and denies any nausea or vomiting.  He takes a baby aspirin daily and uses NSAID for pain.  He had upper and lower endoscopy in 2013 with diverticula noted.  He denies any chest pain, lightheadedness, shortness of breath, or recent fevers or chills. Reports chronic bilateral leg swelling is at baseline. States that he has never had a blood transfusion before but would want to be transfused if recommended.   ED Course: Upon arrival to the ED, patient is found to be afebrile, saturating well on room air, and with vitals otherwise stable.  Chemistry panel is notable for potassium of 5.8 and creatinine 1.32, similar to priors.  CBC is unremarkable.  INR is normal.  Type and screen was performed.  Patient remains hemodynamically stable and will be observed for ongoing evaluation and management of acute lower GI bleed, likely diverticular.  Review of Systems:  All other systems reviewed and apart from HPI, are negative.  Past Medical History:  Diagnosis Date  . Anxiety   . Asthma   . Chronic diastolic CHF (congestive heart failure) (HCC)    diastolic   . COPD (chronic obstructive pulmonary disease) ([place])   . Coronary artery disease    s/p PCI of RCA 03/2009 at which time there was 70%  ramus and 90% RCA, cath 01/2011 showed patent stents in RCA with moderate prox disesae, aneurysmal left circ and small 90% ramus s/p PCI, patent LAD EF 55%  . Diabetes mellitus   . Diverticulitis Oct. 2013   bleeding in the past and Effient for his CAD was stopped  . DVT (deep venous thrombosis) ([place])   . GERD (gastroesophageal reflux disease)   . Hypercholesterolemia   . Hypertension   . Hypothyroidism   . Obesity (BMI 30-39.9)   . OSA (obstructive sleep apnea)    severe on CPAP  . Peripheral vascular disease ([place]) 11/2006   s/p left stent  . Shortness of breath   . Sleep apnea    severe OSA awaiting CPAP titration    Past Surgical History:  Procedure Laterality Date  . ABDOMINAL AORTAGRAM N/A 04/20/2012   Procedure: ABDOMINAL Maxcine Ham;  Surgeon: Angelia Mould, MD;  Location: [facility] CATH LAB;  Service: Cardiovascular;  Laterality: N/A;  . ABDOMINAL AORTAGRAM N/A 12/29/2012   Procedure: ABDOMINAL Maxcine Ham;  Surgeon: Serafina Mitchell, MD;  Location: [facility] CATH LAB;  Service: Cardiovascular;  Laterality: N/A;  . BALLOON DILATION  12/26/2011   Procedure: BALLOON DILATION;  Surgeon: Garlan Fair, MD;  Location: [facility] ENDOSCOPY;  Service: Endoscopy;  Laterality: N/A;  . COLONOSCOPY  08/14/2012   Procedure: COLONOSCOPY;  Surgeon: Arta Silence, MD;  Location: [facility] ENDOSCOPY;  Service: Endoscopy;  Laterality: N/A;  . CORONARY STENT PLACEMENT  2010 and 2012  . ESOPHAGOGASTRODUODENOSCOPY  12/26/2011  Procedure: ESOPHAGOGASTRODUODENOSCOPY (EGD);  Surgeon: Garlan Fair, MD;  Location: [facility] ENDOSCOPY;  Service: Endoscopy;  Laterality: N/A;  . ESOPHAGOGASTRODUODENOSCOPY  08/13/2012   Procedure: ESOPHAGOGASTRODUODENOSCOPY (EGD);  Surgeon: Arta Silence, MD;  Location: [facility] ENDOSCOPY;  Service: Endoscopy;  Laterality: Left;  . HERNIA REPAIR  1992   evntral hernia repair  . LOWER EXTREMITY ANGIOGRAM Bilateral 12/29/2012   Procedure: LOWER EXTREMITY ANGIOGRAM;  Surgeon: Serafina Mitchell, MD;  Location:  [facility] CATH LAB;  Service: Cardiovascular;  Laterality: Bilateral;  bilat lower extrem angio     reports that he quit smoking about 54 years ago. His smoking use included cigarettes and cigars. He has a 7.50 pack-year smoking history. He has never used smokeless tobacco. He reports that he does not drink alcohol or use drugs.  Allergies  Allergen Reactions  . Clopidogrel Itching    Family History  Problem Relation Age of Onset  . Diabetes Mother   . Hyperlipidemia Mother   . Hypertension Mother   . Heart attack Mother   . Heart disease Mother        before age 50  . Diabetes Brother   . Heart disease Father        before age 51  . Breast cancer Sister   . Diabetes Sister   . Hyperlipidemia Sister   . Hypertension Sister   . Heart attack Sister   . Hyperlipidemia Sister   . Hypertension Sister   . Heart disease Sister        Heart Disease before age 10  . Asthma Sister   . Malignant hyperthermia Neg Hx      Prior to Admission medications   Medication Sig Start Date End Date Taking? Authorizing Provider  albuterol (PROVENTIL HFA;VENTOLIN HFA) 108 (90 BASE) MCG/ACT inhaler Inhale 2 puffs into the lungs every 6 (six) hours as needed for wheezing or shortness of breath.    Yes [provider]  albuterol (PROVENTIL) (2.5 MG/3ML) 0.083% nebulizer solution Take 3 mLs (2.5 mg total) by nebulization every 4 (four) hours as needed for wheezing or shortness of breath. 09/25/16  Yes Patrecia Pour, MD  aspirin EC 81 MG tablet Take 81 mg by mouth daily. At 5 pm   Yes [provider]  budesonide-formoterol (SYMBICORT) 160-4.5 MCG/ACT inhaler Inhale 2 puffs into the lungs 2 (two) times daily.   Yes [provider]  ezetimibe (ZETIA) 10 MG tablet Take 10 mg by mouth daily. 03/31/18  Yes [provider]  glimepiride (AMARYL) 2 MG tablet Take 2 mg by mouth daily. At 5 pm   Yes [provider]  levothyroxine (SYNTHROID, LEVOTHROID) 150 MCG tablet Take 150  mcg by mouth daily before breakfast. 05/19/17  Yes [provider]  meloxicam (MOBIC) 15 MG tablet Take 15 mg by mouth daily. 10/22/17  Yes [provider]  metaxalone (SKELAXIN) 800 MG tablet Take 800-1,600 mg by mouth 2 (two) times daily.   Yes [provider]  metoprolol succinate (TOPROL-XL) 50 MG 24 hr tablet TAKE 1 TABLET BY MOUTH EVERY DAY 05/29/18  Yes Turner, Eber Hong, MD  nitroGLYCERIN (NITROSTAT) 0.4 MG SL tablet Place 0.4 mg under the tongue every 5 (five) minutes as needed for chest pain.   Yes [provider]  pantoprazole (PROTONIX) 40 MG tablet TAKE 1 TABLET (40 MG TOTAL) BY MOUTH 2 (TWO) TIMES DAILY. Patient taking differently: Take 40 mg by mouth 2 (two) times daily.  07/29/17  Yes Sueanne Margarita, MD  rosuvastatin (  CRESTOR) 40 MG tablet Take 1 tablet (40 mg total) by mouth daily. 08/10/18 08/10/19 Yes Sueanne Margarita, MD  Spacer/Aero-Holding Chambers (AEROCHAMBER MV) inhaler Use as instructed 05/21/18   Martyn Ehrich, NP    Physical Exam: Vitals:   09/02/18 2339 09/03/18 0004 09/03/18 0030 09/03/18 0100  BP:  114/79 (!) 136/93 (!) 135/105  Pulse:  93 91 86  Resp:  18 (!) 21 20  Temp:  98.6 F (37 C)    TempSrc:  Oral    SpO2: 100% 94% 93% 97%  Weight:  90.7 kg    Height:  5\' 5"  (1.651 m)      Constitutional: NAD, calm  Eyes: PERTLA, lids and conjunctivae normal ENMT: Mucous membranes are moist. Posterior pharynx clear of any exudate or lesions.   Neck: normal, supple, no masses, no thyromegaly Respiratory: clear to auscultation bilaterally, no wheezing, no crackles. Normal respiratory effort.    Cardiovascular: S1 & S2 heard, regular rate and rhythm. Pitting edema to bilateral LE's. Abdomen: No distension, no tenderness, soft. Bowel sounds active.  Musculoskeletal: no clubbing / cyanosis. No joint deformity upper and lower extremities.   Skin: no significant rashes, lesions, ulcers. Warm, dry, well-perfused. Neurologic: CN 2-12  grossly intact. Sensation intact. Strength 5/5 in all 4 limbs.  Psychiatric: Alert and oriented x 3. Very pleasant, cooperative.    Labs on Admission: I have personally reviewed following labs and imaging studies  CBC: Recent Labs  Lab 09/03/18 0029  WBC 7.6  NEUTROABS 4.9  HGB 13.8  HCT 43.7  MCV 93.0  PLT 528   Basic Metabolic Panel: Recent Labs  Lab 09/03/18 0029  NA 138  K 5.8*  CL 100  CO2 29  GLUCOSE 146*  BUN 21  CREATININE 1.32*  CALCIUM 9.1   GFR: Estimated Creatinine Clearance: 41.6 mL/min (A) (by C-G formula based on SCr of 1.32 mg/dL (H)). Liver Function Tests: No results for input(s): AST, ALT, ALKPHOS, BILITOT, PROT, ALBUMIN in the last 168 hours. No results for input(s): LIPASE, AMYLASE in the last 168 hours. No results for input(s): AMMONIA in the last 168 hours. Coagulation Profile: Recent Labs  Lab 09/03/18 0029  INR 0.97   Cardiac Enzymes: No results for input(s): CKTOTAL, CKMB, CKMBINDEX, TROPONINI in the last 168 hours. BNP (last 3 results) Recent Labs    01/26/18 0956  PROBNP 103   HbA1C: No results for input(s): HGBA1C in the last 72 hours. CBG: No results for input(s): GLUCAP in the last 168 hours. Lipid Profile: No results for input(s): CHOL, HDL, LDLCALC, TRIG, CHOLHDL, LDLDIRECT in the last 72 hours. Thyroid Function Tests: No results for input(s): TSH, T4TOTAL, FREET4, T3FREE, THYROIDAB in the last 72 hours. Anemia Panel: No results for input(s): VITAMINB12, FOLATE, FERRITIN, TIBC, IRON, RETICCTPCT in the last 72 hours. Urine analysis:    Component Value Date/Time   COLORURINE YELLOW 11/13/2017 [place] 11/13/2017 1718   LABSPEC 1.013 11/13/2017 1718   PHURINE 5.0 11/13/2017 1718   GLUCOSEU NEGATIVE 11/13/2017 1718   HGBUR SMALL (A) 11/13/2017 1718   BILIRUBINUR NEGATIVE 11/13/2017 1718   KETONESUR 5 (A) 11/13/2017 1718   PROTEINUR 30 (A) 11/13/2017 1718   UROBILINOGEN 0.2 07/12/2014 1702   NITRITE  NEGATIVE 11/13/2017 1718   LEUKOCYTESUR NEGATIVE 11/13/2017 1718   Sepsis Labs: @LABRCNTIP (procalcitonin:4,lacticidven:4) )No results found for this or any previous visit (from the past 240 hour(s)).   Radiological Exams on Admission: No results found.  EKG: Ordered and pending.  Assessment/Plan   1. Acute GI bleeding  - Presents with bright red blood and clots per rectum, no pain or N/V  - Hemodynamically stable in ED with initial Hgb normal  - Has had 2 more episodes while in ED  - He takes baby ASA and Mobic daily, but no upper GI symptoms or elevated BUN  - Has known diverticula and this is likely a diverticular bleed  - Plan for bowel rest, type & screen, IVF hydration, hold ASA, and follow serial H&H  2. Hyperkalemia  - Serum potassium is 5.8 in ED   - Check EKG, continue IVF hydration, repeat chemistry panel   3. Mild renal insufficiency  - SCr is 1.32 on admission, similar to priors  - Renally-dose medications    4. COPD  - No SOB or wheezing   - Continue ICS/LABA and as-needed albuterol   5. CAD - No anginal complaints  - Hold ASA initially given acute bleeding  6. Type II DM  - A1c was 6.8% in 2018  - Managed with glimepiride at home, held on admission  - Check CBG's and use a low-intensity SSI with Novolog as needed while in hospital   7. Hypertension with hypertensive urgency  - BP as high as 200/100 in ED  - With acute GIB, will treat cautiously with low-dose hydralazine IVP's as-needed     DVT prophylaxis: SCD's  Code Status: Full  Family Communication: Daughter updated at bedside  Consults called: None Admission status: Observation     Vianne Bulls, MD [facility] Pager [phone]  If 7PM-7AM, please contact night-coverage [email or web address] Password [email or web address]  09/03/2018, 1:44 AM

## 2018-09-03 NOTE — Progress Notes (Signed)
PROGRESS NOTE    [name]  EGB:[id number] DOB: Sep 19, 1930 DOA: 09/02/2018 PCP: [name], MD    Brief Narrative;  84 year old with past medical history significant for diabetes type 2, COPD, hypertension,diverticulosis,  coronary artery disease status post stenting more than 5 years ago, on aspirin. Who presents with bright red blood per rectum. He he had 2 episode of bright blood per rectum at home, and 2 episodes in the ED. Evaluation in the ED patient had a hemoglobin at 13, BUN  21 creatinine 1.3. Was admitted for observation of GI bleed   Assessment & Plan:   Principal Problem:   Acute lower GI bleeding Active Problems:   HYPERTENSION, BENIGN   Coronary artery disease   OSA (obstructive sleep apnea)   Mild renal insufficiency   COPD (chronic obstructive pulmonary disease) (HCC)   Diabetes mellitus type II, non insulin dependent (HCC)   Hyperkalemia   1-Lower GI bleed, acute; Presumed diverticular bleed. Will continue to monitor hemoglobin. No further  Bleeding, will start  clear diet. GI consulted Hold aspirin Hemoglobin on admission at 13, has decreased to 12.   2-Hypertension; with hypertensive urgency. Blood pressure in the ED in the 200 range resume metoprolol. When necessary hydralazine.  3-COPD; continue with nebulizer as needed 4-hyperkalemia; resolved.  5-mild renal insufficiency; continue with IV fluids 6-Coronary artery disease; stent 5 years ago. Denies chest pain. Hold aspirin. 7-hypothyroidism; continue with Synthroid.  RN Pressure Injury Documentation:    Malnutrition Type:      Malnutrition Characteristics:      Nutrition Interventions:     Estimated body mass index is 33.28 kg/m as calculated from the following:   Height as of this encounter: 5\' 5"  (1.651 m).   Weight as of this encounter: 90.7 kg.   DVT prophylaxis: SCDs Code Status: full code Family Communication: daughter at bedside Disposition Plan: remaining  for observation   Consultants:   GI   Procedures:   none   Antimicrobials:  none   Subjective: He had 2 bloody bowel movement at home, 2 bloody bowel movement in the ED today. He denies abdominal pain  Objective: Vitals:   09/03/18 0745 09/03/18 0800 09/03/18 0815 09/03/18 0829  BP:  (!) 162/93    Pulse: 63 63 67 74  Resp: (!) 24 20 11 20   Temp:      TempSrc:      SpO2: 95% 97% 97% 97%  Weight:      Height:        Intake/Output Summary (Last 24 hours) at 09/03/2018 0913 Last data filed at 09/03/2018 0231 Gross per 24 hour  Intake 3 ml  Output -  Net 3 ml   Filed Weights   09/03/18 0004  Weight: 90.7 kg    Examination:  General exam: Appears calm and comfortable  Respiratory system: Clear to auscultation. Respiratory effort normal. Cardiovascular system: S1 & S2 heard, RRR. No JVD, murmurs, rubs, gallops or clicks. No pedal edema. Gastrointestinal system: Abdomen is nondistended, soft and nontender. No organomegaly or masses felt. Normal bowel sounds heard. Central nervous system: Alert and oriented. No focal neurological deficits. Extremities: Symmetric 5 x 5 power. Skin: No rashes, lesions or ulcers Psychiatry: Judgement and insight appear normal. Mood & affect appropriate.     Data Reviewed: I have personally reviewed following labs and imaging studies  CBC: Recent Labs  Lab 09/03/18 0029 09/03/18 0533  WBC 7.6  --   NEUTROABS 4.9  --   HGB 13.8  12.6*  HCT 43.7 40.6  MCV 93.0  --   PLT 165  --    Basic Metabolic Panel: Recent Labs  Lab 09/03/18 0029 09/03/18 0533  NA 138 141  K 5.8* 3.7  CL 100 105  CO2 29 29  GLUCOSE 146* 153*  BUN 21 18  CREATININE 1.32* 0.98  CALCIUM 9.1 8.5*   GFR: Estimated Creatinine Clearance: 56 mL/min (by C-G formula based on SCr of 0.98 mg/dL). Liver Function Tests: No results for input(s): AST, ALT, ALKPHOS, BILITOT, PROT, ALBUMIN in the last 168 hours. No results for input(s): LIPASE, AMYLASE in  the last 168 hours. No results for input(s): AMMONIA in the last 168 hours. Coagulation Profile: Recent Labs  Lab 09/03/18 0029  INR 0.97   Cardiac Enzymes: No results for input(s): CKTOTAL, CKMB, CKMBINDEX, TROPONINI in the last 168 hours. BNP (last 3 results) Recent Labs    01/26/18 0956  PROBNP 103   HbA1C: No results for input(s): HGBA1C in the last 72 hours. CBG: Recent Labs  Lab 09/03/18 0236 09/03/18 0544 09/03/18 0851  GLUCAP 118* 129* 122*   Lipid Profile: No results for input(s): CHOL, HDL, LDLCALC, TRIG, CHOLHDL, LDLDIRECT in the last 72 hours. Thyroid Function Tests: No results for input(s): TSH, T4TOTAL, FREET4, T3FREE, THYROIDAB in the last 72 hours. Anemia Panel: No results for input(s): VITAMINB12, FOLATE, FERRITIN, TIBC, IRON, RETICCTPCT in the last 72 hours. Sepsis Labs: No results for input(s): PROCALCITON, LATICACIDVEN in the last 168 hours.  No results found for this or any previous visit (from the past 240 hour(s)).       Radiology Studies: No results found.      Scheduled Meds: . ezetimibe  10 mg Oral Daily  . insulin aspart  0-9 Units Subcutaneous Q4H  . [START ON 09/04/2018] levothyroxine  150 mcg Oral QAC breakfast  . metoprolol succinate  50 mg Oral Daily  . mometasone-formoterol  2 puff Inhalation BID  . rosuvastatin  40 mg Oral Daily  . sodium chloride flush  3 mL Intravenous Q12H   Continuous Infusions: . sodium chloride       LOS: 0 days    Time spent: 35 minutes.     Elmarie Shiley, MD [facility] Pager [phone]  If 7PM-7AM, please contact night-coverage [email or web address] Password [facility] 09/03/2018, 9:13 AM

## 2018-09-03 NOTE — ED Notes (Signed)
Message sent to pharmacy to verify medications, so medications can be administered

## 2018-09-03 NOTE — ED Notes (Signed)
Pt has had 2 episodes of bright red blood since arrival. Clots noted

## 2018-09-03 NOTE — ED Notes (Signed)
Hospitalist at bedside 

## 2018-09-03 NOTE — ED Notes (Signed)
Pt had large soft BM. No blood noted in stool

## 2018-09-03 NOTE — Progress Notes (Signed)
Spoke with patient at MD request to answer questions regarding medication administration. Emphasized proper Synthroid and Amaryl timing, but concluded that patient was ok taking all his medications together 30-60 min prior to supper at 5p as long as he maintained consistency with timing and meals.  Reuel Boom, PharmD, BCPS [phone] 09/03/2018, 12:38 PM

## 2018-09-04 ENCOUNTER — Observation Stay (HOSPITAL_COMMUNITY): Payer: Medicare HMO

## 2018-09-04 DIAGNOSIS — K922 Gastrointestinal hemorrhage, unspecified: Secondary | ICD-10-CM | POA: Diagnosis not present

## 2018-09-04 DIAGNOSIS — D649 Anemia, unspecified: Secondary | ICD-10-CM | POA: Diagnosis not present

## 2018-09-04 DIAGNOSIS — K921 Melena: Secondary | ICD-10-CM | POA: Diagnosis not present

## 2018-09-04 DIAGNOSIS — K5731 Diverticulosis of large intestine without perforation or abscess with bleeding: Secondary | ICD-10-CM | POA: Diagnosis not present

## 2018-09-04 LAB — HEMOGLOBIN AND HEMATOCRIT, BLOOD
HCT: 40 % (ref 39.0–52.0)
HCT: 40 % (ref 39.0–52.0)
HEMOGLOBIN: 12.3 g/dL — AB (ref 13.0–17.0)
Hemoglobin: 12.4 g/dL — ABNORMAL LOW (ref 13.0–17.0)

## 2018-09-04 LAB — GLUCOSE, CAPILLARY
GLUCOSE-CAPILLARY: 89 mg/dL (ref 70–99)
GLUCOSE-CAPILLARY: 86 mg/dL (ref 70–99)
GLUCOSE-CAPILLARY: 145 mg/dL — AB (ref 70–99)
Glucose-Capillary: 79 mg/dL (ref 70–99)
Glucose-Capillary: 131 mg/dL — ABNORMAL HIGH (ref 70–99)

## 2018-09-04 LAB — HEMATOCRIT: HCT: 39.4 % (ref 39.0–52.0)

## 2018-09-04 LAB — HEMOGLOBIN: Hemoglobin: 12.3 g/dL — ABNORMAL LOW (ref 13.0–17.0)

## 2018-09-04 MED ORDER — TECHNETIUM TC 99M-LABELED RED BLOOD CELLS IV KIT
23.7000 | PACK | Freq: Once | INTRAVENOUS | Status: AC | PRN
  Administered 2018-09-04: 23.7 via INTRAVENOUS

## 2018-09-04 NOTE — Progress Notes (Signed)
PROGRESS NOTE    [name]  ACZ:[id number] DOB: 1929-12-10 DOA: 09/02/2018 PCP: [name], MD    Brief Narrative;  84 year old with past medical history significant for diabetes type 2, COPD, hypertension,diverticulosis,  coronary artery disease status post stenting more than 5 years ago, on aspirin. Who presents with bright red blood per rectum. He he had 2 episode of bright blood per rectum at home, and 2 episodes in the ED. Evaluation in the ED patient had a hemoglobin at 13, BUN  21 creatinine 1.3. Was admitted for observation of GI bleed   Assessment & Plan:   Principal Problem:   Acute lower GI bleeding Active Problems:   HYPERTENSION, BENIGN   Coronary artery disease   OSA (obstructive sleep apnea)   Mild renal insufficiency   COPD (chronic obstructive pulmonary disease) (HCC)   Diabetes mellitus type II, non insulin dependent (HCC)   Hyperkalemia   1-Lower GI bleed, acute; Presumed diverticular bleed. continue to monitor hemoglobin. GI consulted Hold aspirin Hemoglobin on admission at 13, has decreased to 12.  Had two episodes of blood per rectum.  Plan for tagged RBC study today. If positive will need IR consultation.   2-Hypertension; with hypertensive urgency. Blood pressure in the ED in the 200 range resume metoprolol. When necessary hydralazine. Improved.   3-COPD; continue with nebulizer as needed 4-hyperkalemia; resolved. Repeat labs in am.  5-mild renal insufficiency; continue with IV fluids 6-Coronary artery disease; stent 5 years ago. Denies chest pain. Hold aspirin. 7-hypothyroidism; continue with Synthroid.  RN Pressure Injury Documentation:    Malnutrition Type:      Malnutrition Characteristics:      Nutrition Interventions:     Estimated body mass index is 33.28 kg/m as calculated from the following:   Height as of this encounter: 5\' 5"  (1.651 m).   Weight as of this encounter: 90.7 kg.   DVT prophylaxis:  SCDs Code Status: full code Family Communication: daughter at bedside Disposition Plan: remaining for observation   Consultants:   GI   Procedures:   none   Antimicrobials:  none   Subjective: He had 2 bloody BM today.   Objective: Vitals:   09/03/18 2007 09/03/18 2020 09/04/18 0448 09/04/18 0715  BP: (!) 155/97  (!) 144/62   Pulse: 74  79   Resp: 19  18   Temp: 97.6 F (36.4 C)  98.2 F (36.8 C)   TempSrc: Oral  Oral   SpO2: 96% 96% 95% 95%  Weight:      Height:        Intake/Output Summary (Last 24 hours) at 09/04/2018 0936 Last data filed at 09/03/2018 1600 Gross per 24 hour  Intake 577.5 ml  Output -  Net 577.5 ml   Filed Weights   09/03/18 0004  Weight: 90.7 kg    Examination:  General exam: NAD Respiratory system: CTA Cardiovascular system: S 1, S 2 RRR Gastrointestinal system: BS present, soft, nt  Central nervous system: non focal.  Extremities: symmetric power.  Skin: No rash  Data Reviewed: I have personally reviewed following labs and imaging studies  CBC: Recent Labs  Lab 09/03/18 0029 09/03/18 0533 09/03/18 1347 09/03/18 2038 09/04/18 0541  WBC 7.6  --   --   --   --   NEUTROABS 4.9  --   --   --   --   HGB 13.8 12.6* 13.7 13.5 12.3*  HCT 43.7 40.6 43.8 43.8 39.4  MCV 93.0  --   --   --   --  PLT 165  --   --   --   --    Basic Metabolic Panel: Recent Labs  Lab 09/03/18 0029 09/03/18 0533  NA 138 141  K 5.8* 3.7  CL 100 105  CO2 29 29  GLUCOSE 146* 153*  BUN 21 18  CREATININE 1.32* 0.98  CALCIUM 9.1 8.5*   GFR: Estimated Creatinine Clearance: 56 mL/min (by C-G formula based on SCr of 0.98 mg/dL). Liver Function Tests: No results for input(s): AST, ALT, ALKPHOS, BILITOT, PROT, ALBUMIN in the last 168 hours. No results for input(s): LIPASE, AMYLASE in the last 168 hours. No results for input(s): AMMONIA in the last 168 hours. Coagulation Profile: Recent Labs  Lab 09/03/18 0029  INR 0.97   Cardiac  Enzymes: No results for input(s): CKTOTAL, CKMB, CKMBINDEX, TROPONINI in the last 168 hours. BNP (last 3 results) Recent Labs    01/26/18 0956  PROBNP 103   HbA1C: No results for input(s): HGBA1C in the last 72 hours. CBG: Recent Labs  Lab 09/03/18 1600 09/03/18 2004 09/03/18 2356 09/04/18 0430 09/04/18 0743  GLUCAP 112* 80 84 89 86   Lipid Profile: No results for input(s): CHOL, HDL, LDLCALC, TRIG, CHOLHDL, LDLDIRECT in the last 72 hours. Thyroid Function Tests: No results for input(s): TSH, T4TOTAL, FREET4, T3FREE, THYROIDAB in the last 72 hours. Anemia Panel: No results for input(s): VITAMINB12, FOLATE, FERRITIN, TIBC, IRON, RETICCTPCT in the last 72 hours. Sepsis Labs: No results for input(s): PROCALCITON, LATICACIDVEN in the last 168 hours.  No results found for this or any previous visit (from the past 240 hour(s)).       Radiology Studies: No results found.      Scheduled Meds: . ezetimibe  10 mg Oral Daily  . insulin aspart  0-9 Units Subcutaneous Q4H  . levothyroxine  150 mcg Oral QAC breakfast  . metoprolol succinate  50 mg Oral Daily  . mometasone-formoterol  2 puff Inhalation BID  . rosuvastatin  40 mg Oral q1800  . sodium chloride flush  3 mL Intravenous Q12H   Continuous Infusions: . sodium chloride 50 mL/hr at 09/03/18 1811     LOS: 0 days    Time spent: 35 minutes.     Elmarie Shiley, MD [facility] Pager [phone]  If 7PM-7AM, please contact night-coverage [email or web address] Password [facility] 09/04/2018, 9:36 AM

## 2018-09-04 NOTE — Progress Notes (Signed)
Patient had bright red blood with stool this morning.  Has not had any blood in stool since yesterday afternoon.

## 2018-09-04 NOTE — Progress Notes (Signed)
Subjective: Several episodes of hematochezia this morning. No abdominal pain or hematemesis.  Objective: Vital signs in last 24 hours: Temp:  [97.6 F (36.4 C)-98.2 F (36.8 C)] 98.2 F (36.8 C) (11/22 0448) Pulse Rate:  [69-79] 79 (11/22 0448) Resp:  [13-20] 18 (11/22 0448) BP: (144-180)/(62-97) 144/62 (11/22 0448) SpO2:  [92 %-96 %] 95 % (11/22 0715) Weight change:  Last BM Date: 09/03/18  PE: GEN:   NAD, younger-appearing than stated age ABD:  Soft, non-tender, protuberant  Lab Results: CBC    Component Value Date/Time   WBC 7.6 09/03/2018 0029   RBC 4.70 09/03/2018 0029   HGB 12.3 (L) 09/04/2018 0541   HGB 13.8 01/26/2018 0956   HCT 39.4 09/04/2018 0541   HCT 41.4 01/26/2018 0956   PLT 165 09/03/2018 0029   PLT 166 01/26/2018 0956   MCV 93.0 09/03/2018 0029   MCV 88 01/26/2018 0956   MCH 29.4 09/03/2018 0029   MCHC 31.6 09/03/2018 0029   RDW 13.5 09/03/2018 0029   RDW 14.1 01/26/2018 0956   LYMPHSABS 2.0 09/03/2018 0029   LYMPHSABS 2.1 12/05/2016 0957   MONOABS 0.6 09/03/2018 0029   EOSABS 0.2 09/03/2018 0029   EOSABS 0.2 12/05/2016 0957   BASOSABS 0.0 09/03/2018 0029   BASOSABS 0.0 12/05/2016 0957   CMP     Component Value Date/Time   NA 141 09/03/2018 0533   NA 141 01/26/2018 0956   K 3.7 09/03/2018 0533   CL 105 09/03/2018 0533   CO2 29 09/03/2018 0533   GLUCOSE 153 (H) 09/03/2018 0533   BUN 18 09/03/2018 0533   BUN 14 01/26/2018 0956   CREATININE 0.98 09/03/2018 0533   CALCIUM 8.5 (L) 09/03/2018 0533   PROT 7.7 11/13/2017 1425   PROT 6.9 06/30/2017 1356   ALBUMIN 4.4 11/13/2017 1425   ALBUMIN 4.3 06/30/2017 1356   AST 89 (H) 11/13/2017 1425   ALT 51 11/13/2017 1425   ALKPHOS 42 11/13/2017 1425   BILITOT 0.8 11/13/2017 1425   BILITOT 0.5 06/30/2017 1356   GFRNONAA >60 09/03/2018 0533   GFRAA >60 09/03/2018 0533   Assessment:  1. Hematochezia. Suspect diverticular bleeding. Few episodes this morning.  Hemodynamically stable. Does not  appear clinically ill at this time.  Had similar presentation in 2013 with endoscopy and colonoscopy done at that time.  2. Acute blood loss anemia, mild.  Plan:   1.  Tagged RBC study today; if +, IR consultation for consideration of angiogram +/- embolization. 2.  [facility] will follow.   Landry Dyke 09/04/2018, 10:44 AM   Cell [phone] If no answer or after 5 PM call [phone]

## 2018-09-05 DIAGNOSIS — K922 Gastrointestinal hemorrhage, unspecified: Secondary | ICD-10-CM | POA: Diagnosis not present

## 2018-09-05 DIAGNOSIS — K921 Melena: Secondary | ICD-10-CM | POA: Diagnosis not present

## 2018-09-05 LAB — BASIC METABOLIC PANEL
Anion gap: 8 (ref 5–15)
BUN: 12 mg/dL (ref 8–23)
CHLORIDE: 103 mmol/L (ref 98–111)
CO2: 29 mmol/L (ref 22–32)
CREATININE: 1.12 mg/dL (ref 0.61–1.24)
Calcium: 9 mg/dL (ref 8.9–10.3)
GFR calc Af Amer: >60 mL/min (ref >60)
GFR calc non Af Amer: 57 mL/min — ABNORMAL LOW (ref >60)
Glucose, Bld: 109 mg/dL — ABNORMAL HIGH (ref 70–99)
POTASSIUM: 3.6 mmol/L (ref 3.5–5.1)
Sodium: 140 mmol/L (ref 135–145)

## 2018-09-05 LAB — GLUCOSE, CAPILLARY
GLUCOSE-CAPILLARY: 87 mg/dL (ref 70–99)
GLUCOSE-CAPILLARY: 117 mg/dL — AB (ref 70–99)
Glucose-Capillary: 173 mg/dL — ABNORMAL HIGH (ref 70–99)
Glucose-Capillary: 65 mg/dL — ABNORMAL LOW (ref 70–99)
Glucose-Capillary: 97 mg/dL (ref 70–99)
Glucose-Capillary: 98 mg/dL (ref 70–99)

## 2018-09-05 LAB — CBC
HCT: 39.6 % (ref 39.0–52.0)
HEMOGLOBIN: 12.2 g/dL — AB (ref 13.0–17.0)
MCH: 28.6 pg (ref 26.0–34.0)
MCHC: 30.8 g/dL (ref 30.0–36.0)
MCV: 93 fL (ref 80.0–100.0)
Platelets: 152 10*3/uL (ref 150–400)
RBC: 4.26 MIL/uL (ref 4.22–5.81)
RDW: 13.6 % (ref 11.5–15.5)
WBC: 8.7 10*3/uL (ref 4.0–10.5)
nRBC: 0 % (ref 0.0–0.2)

## 2018-09-05 MED ORDER — POLYETHYLENE GLYCOL 3350 17 G PO PACK
17.0000 g | PACK | Freq: Two times a day (BID) | ORAL | Status: DC
  Administered 2018-09-05 – 2018-09-06 (×2): 17 g via ORAL
  Filled 2018-09-05 (×5): qty 1

## 2018-09-05 MED ORDER — FUROSEMIDE 20 MG PO TABS
20.0000 mg | ORAL_TABLET | Freq: Once | ORAL | Status: AC
  Administered 2018-09-05: 20 mg via ORAL
  Filled 2018-09-05: qty 1

## 2018-09-05 NOTE — Progress Notes (Signed)
Pt declined cpap tonight.  Machine in room on standby if needed.  Pt was advised that RT is available all night should he change his mind.

## 2018-09-05 NOTE — Progress Notes (Signed)
PROGRESS NOTE    Eddie SHAMOON  GGY:[id number] DOB: 1930-05-13 DOA: 09/02/2018 PCP: [name], MD    Brief Narrative;  84 year old with past medical history significant for diabetes type 2, COPD, hypertension,diverticulosis,  coronary artery disease status post stenting more than 5 years ago, on aspirin. Who presents with bright red blood per rectum. He he had 2 episode of bright blood per rectum at home, and 2 episodes in the ED. Evaluation in the ED patient had a hemoglobin at 13, BUN  21 creatinine 1.3. Was admitted for observation of GI bleed   Assessment & Plan:   Principal Problem:   Acute lower GI bleeding Active Problems:   HYPERTENSION, BENIGN   Coronary artery disease   OSA (obstructive sleep apnea)   Mild renal insufficiency   COPD (chronic obstructive pulmonary disease) (HCC)   Diabetes mellitus type II, non insulin dependent (HCC)   Hyperkalemia   1-Lower GI bleed, acute; Diverticular bleed.  Presumed diverticular bleed. continue to monitor hemoglobin. GI consulted Hold aspirin Hb stable at 12.  Had two episodes of blood per rectum 11-22. Plan for tagged RBC study  Negative.  Plan to observe overnight, for any recurrence of bleeding.   2-Hypertension; with hypertensive urgency. Blood pressure in the ED in the 200 range resume metoprolol. When necessary hydralazine. Improved.   3-COPD; continue with nebulizer as needed 4-hyperkalemia; resolved. Repeat labs in am.  5-mild renal insufficiency; resolved. NSL fluids.  Give a dose of lasix, for congestion.  6-Coronary artery disease; stent 5 years ago. Denies chest pain. Hold aspirin. 7-hypothyroidism; continue with Synthroid.  RN Pressure Injury Documentation:    Malnutrition Type:      Malnutrition Characteristics:      Nutrition Interventions:     Estimated body mass index is 33.28 kg/m as calculated from the following:   Height as of this encounter: 5\' 5"  (1.651 m).   Weight as of  this encounter: 90.7 kg.   DVT prophylaxis: SCDs Code Status: full code Family Communication: daughter at bedside Disposition Plan: remaining for observation   Consultants:   GI   Procedures:   none   Antimicrobials:  none   Subjective: No blood today in the stool.    Objective: Vitals:   09/04/18 1938 09/04/18 2039 09/05/18 0403 09/05/18 0828  BP:  (!) 147/83 (!) 160/88   Pulse:  (!) 53 77 71  Resp:  16 20 16   Temp:  97.9 F (36.6 C) 98.4 F (36.9 C)   TempSrc:  Oral Oral   SpO2: 97% 95% 100% 92%  Weight:      Height:        Intake/Output Summary (Last 24 hours) at 09/05/2018 1428 Last data filed at 09/05/2018 0800 Gross per 24 hour  Intake 689.97 ml  Output -  Net 689.97 ml   Filed Weights   09/03/18 0004  Weight: 90.7 kg    Examination:  General exam: NAD Respiratory system: bilateral ronchus  Cardiovascular system:  S 1, S 2 RRR Gastrointestinal system: BS present, soft, nt Central nervous system: Non focal.  Extremities: Symmetric power.  Skin: No rash   Data Reviewed: I have personally reviewed following labs and imaging studies  CBC: Recent Labs  Lab 09/03/18 0029  09/03/18 2038 09/04/18 0541 09/04/18 1132 09/04/18 1929 09/05/18 0533  WBC 7.6  --   --   --   --   --  8.7  NEUTROABS 4.9  --   --   --   --   --   --  HGB 13.8   < > 13.5 12.3* 12.3* 12.4* 12.2*  HCT 43.7   < > 43.8 39.4 40.0 40.0 39.6  MCV 93.0  --   --   --   --   --  93.0  PLT 165  --   --   --   --   --  152   < > = values in this interval not displayed.   Basic Metabolic Panel: Recent Labs  Lab 09/03/18 0029 09/03/18 0533 09/05/18 0533  NA 138 141 140  K 5.8* 3.7 3.6  CL 100 105 103  CO2 29 29 29   GLUCOSE 146* 153* 109*  BUN 21 18 12   CREATININE 1.32* 0.98 1.12  CALCIUM 9.1 8.5* 9.0   GFR: Estimated Creatinine Clearance: 49 mL/min (by C-G formula based on SCr of 1.12 mg/dL). Liver Function Tests: No results for input(s): AST, ALT, ALKPHOS,  BILITOT, PROT, ALBUMIN in the last 168 hours. No results for input(s): LIPASE, AMYLASE in the last 168 hours. No results for input(s): AMMONIA in the last 168 hours. Coagulation Profile: Recent Labs  Lab 09/03/18 0029  INR 0.97   Cardiac Enzymes: No results for input(s): CKTOTAL, CKMB, CKMBINDEX, TROPONINI in the last 168 hours. BNP (last 3 results) Recent Labs    01/26/18 0956  PROBNP 103   HbA1C: No results for input(s): HGBA1C in the last 72 hours. CBG: Recent Labs  Lab 09/04/18 2043 09/05/18 0004 09/05/18 0033 09/05/18 0406 09/05/18 0740  GLUCAP 131* 65* 97 87 98   Lipid Profile: No results for input(s): CHOL, HDL, LDLCALC, TRIG, CHOLHDL, LDLDIRECT in the last 72 hours. Thyroid Function Tests: No results for input(s): TSH, T4TOTAL, FREET4, T3FREE, THYROIDAB in the last 72 hours. Anemia Panel: No results for input(s): VITAMINB12, FOLATE, FERRITIN, TIBC, IRON, RETICCTPCT in the last 72 hours. Sepsis Labs: No results for input(s): PROCALCITON, LATICACIDVEN in the last 168 hours.  No results found for this or any previous visit (from the past 240 hour(s)).       Radiology Studies: Nm Gi Blood Loss  Result Date: 09/04/2018 CLINICAL DATA:  Active gastrointestinal bleeding this morning. EXAM: NUCLEAR MEDICINE GASTROINTESTINAL BLEEDING SCAN TECHNIQUE: Sequential abdominal images were obtained following intravenous administration of Tc-72m labeled red blood cells. RADIOPHARMACEUTICALS:  23.7 mCi Tc-62m pertechnetate in-vitro labeled red cells. COMPARISON:  None. FINDINGS: No tagged blood cell radiotracer activity conforming to small-bowel or colon to localize active gastrointestinal bleeding. Exam performed for 2 hours. Physiologic activity noted in the solid organs and GU tract. IMPRESSION: No evidence of active gastrointestinal bleeding over 2 hour study. Electronically Signed   By: Suzy Bouchard M.D.   On: 09/04/2018 16:12        Scheduled Meds: . ezetimibe   10 mg Oral Daily  . insulin aspart  0-9 Units Subcutaneous Q4H  . levothyroxine  150 mcg Oral QAC breakfast  . metoprolol succinate  50 mg Oral Daily  . mometasone-formoterol  2 puff Inhalation BID  . polyethylene glycol  17 g Oral BID  . rosuvastatin  40 mg Oral q1800  . sodium chloride flush  3 mL Intravenous Q12H   Continuous Infusions:    LOS: 0 days    Time spent: 35 minutes.     Eddie Shiley, MD [facility] Pager [phone]  If 7PM-7AM, please contact night-coverage [email or web address] Password [facility] 09/05/2018, 2:28 PM

## 2018-09-05 NOTE — Progress Notes (Signed)
Blood sugar 65, pt asymptomatic. Grape juice given. 97 on recheck. Holding sliding scale insulin. Will continue to monitor at this time.

## 2018-09-05 NOTE — Progress Notes (Signed)
No hematochezia for 24 hours.  Hungry.  Bleeding scan negative last night.  Hemoglobin this morning 12 point, stable.  Skin warm, radial pulse is full, patient alert and able to stand up without difficulty.  Impression: Lower GI bleed presumed diverticular origin, currently quiescent.  Plan:   1. Advance diet. (Ordered)   2.  Continue observation while monitoring labs.    3. General principles of diverticular bleeding management discussed with patient and family at bedside.    4. I do not anticipate doing endoscopy or colonoscopy on this admission as long as the bleeding remains stopped.    5. They are aware that it is not uncommon for the bleeding to restart, hence the need to continue to observe the patient at least another day in the hospital.  Cleotis Nipper, M.D. Pager [phone] If no answer or after 5 PM call [phone]

## 2018-09-06 DIAGNOSIS — K921 Melena: Secondary | ICD-10-CM | POA: Diagnosis not present

## 2018-09-06 DIAGNOSIS — K922 Gastrointestinal hemorrhage, unspecified: Secondary | ICD-10-CM | POA: Diagnosis not present

## 2018-09-06 LAB — CBC
HCT: 41.1 % (ref 39.0–52.0)
HEMOGLOBIN: 12.7 g/dL — AB (ref 13.0–17.0)
MCH: 28.6 pg (ref 26.0–34.0)
MCHC: 30.9 g/dL (ref 30.0–36.0)
MCV: 92.6 fL (ref 80.0–100.0)
NRBC: 0 % (ref 0.0–0.2)
Platelets: 162 10*3/uL (ref 150–400)
RBC: 4.44 MIL/uL (ref 4.22–5.81)
RDW: 13.6 % (ref 11.5–15.5)
WBC: 8.4 10*3/uL (ref 4.0–10.5)

## 2018-09-06 LAB — GLUCOSE, CAPILLARY
GLUCOSE-CAPILLARY: 106 mg/dL — AB (ref 70–99)
Glucose-Capillary: 107 mg/dL — ABNORMAL HIGH (ref 70–99)
Glucose-Capillary: 112 mg/dL — ABNORMAL HIGH (ref 70–99)

## 2018-09-06 MED ORDER — POLYETHYLENE GLYCOL 3350 17 G PO PACK: 17.0000 g | PACK | Freq: Two times a day (BID) | ORAL | 0 refills | Status: DC

## 2018-09-06 MED ORDER — POTASSIUM CHLORIDE CRYS ER 20 MEQ PO TBCR: 20.0000 meq | EXTENDED_RELEASE_TABLET | Freq: Once | ORAL | Status: DC

## 2018-09-06 MED ORDER — SENNOSIDES-DOCUSATE SODIUM 8.6-50 MG PO TABS: 1.0000 | ORAL_TABLET | Freq: Two times a day (BID) | ORAL | 0 refills | Status: DC

## 2018-09-06 MED ORDER — METAXALONE 800 MG PO TABS: 800.0000 mg | ORAL_TABLET | Freq: Two times a day (BID) | ORAL | 0 refills | Status: DC | PRN

## 2018-09-06 NOTE — Discharge Summary (Signed)
Physician Discharge Summary  [name] VEH:[id number] DOB: 05-04-1930 DOA: 09/02/2018  PCP: [name], MD  Admit date: 09/02/2018 Discharge date: 09/06/2018  Admitted From: Home Disposition: Home  Recommendations for Outpatient Follow-up:  1. Follow up with PCP in 1-2 weeks 2. Please obtain BMP/CBC in one week 3. He will need CBC check, occult blood check.  If positive he will need to follow-up with GI for further evaluation of colonoscopy. 4. Resume aspirin in 1 week.  Home Health: None Discharge Condition: Stable CODE STATUS: Full code Diet recommendation: Heart Healthy  Brief/Interim Summary: Brief Narrative;  84 year old with past medical history significant for diabetes type 2, COPD, hypertension,diverticulosis,  coronary artery disease status post stenting more than 5 years ago, on aspirin. Who presents with bright red blood per rectum. He he had 2 episode of bright blood per rectum at home, and 2 episodes in the ED. Evaluation in the ED patient had a hemoglobin at 13, BUN  21 creatinine 1.3. Was admitted for observation of GI bleed   Assessment & Plan:   Principal Problem:   Acute lower GI bleeding Active Problems:   HYPERTENSION, BENIGN   Coronary artery disease   OSA (obstructive sleep apnea)   Mild renal insufficiency   COPD (chronic obstructive pulmonary disease) (HCC)   Diabetes mellitus type II, non insulin dependent (HCC)   Hyperkalemia   1-Lower GI bleed, acute; Diverticular bleed.  Presumed diverticular bleed. continue to monitor hemoglobin. GI consulted Hold aspirin Hb stable at 12.  Had two episodes of blood per rectum 11-22. Plan for tagged RBC study  Negative.  Patient without any further  rectal bleeding.  Hemoglobin has remained stable. See Dr. Cristina Gong note, regarding further recommendations.    2-Hypertension; with hypertensive urgency. Blood pressure in the ED in the 200 range resume metoprolol. When necessary  hydralazine. Improved.   3-COPD; continue with nebulizer as needed 4-hyperkalemia; resolved. Repeat labs in am.  5-mild renal insufficiency; resolved. NSL fluids.  Give a dose of lasix, for congestion.  6-Coronary artery disease; stent 5 years ago. Denies chest pain. Hold aspirin. 7-hypothyroidism; continue with Synthroid.   Discharge Diagnoses:  Principal Problem:   Acute lower GI bleeding Active Problems:   HYPERTENSION, BENIGN   Coronary artery disease   OSA (obstructive sleep apnea)   Mild renal insufficiency   COPD (chronic obstructive pulmonary disease) (HCC)   Diabetes mellitus type II, non insulin dependent (HCC)   Hyperkalemia    Discharge Instructions  Discharge Instructions    Diet - low sodium heart healthy   Complete by:  As directed    Increase activity slowly   Complete by:  As directed      Allergies as of 09/06/2018      Reactions   Clopidogrel Itching      Medication List    STOP taking these medications   aspirin EC 81 MG tablet   meloxicam 15 MG tablet Commonly known as:  MOBIC     TAKE these medications   AEROCHAMBER MV inhaler Use as instructed   albuterol (2.5 MG/3ML) 0.083% nebulizer solution Commonly known as:  PROVENTIL Take 3 mLs (2.5 mg total) by nebulization every 4 (four) hours as needed for wheezing or shortness of breath.   albuterol 108 (90 Base) MCG/ACT inhaler Commonly known as:  PROVENTIL HFA;VENTOLIN HFA Inhale 2 puffs into the lungs every 6 (six) hours as needed for wheezing or shortness of breath.   budesonide-formoterol 160-4.5 MCG/ACT inhaler Commonly known as:  SYMBICORT Inhale 2 puffs into the lungs 2 (two) times daily.   ezetimibe 10 MG tablet Commonly known as:  ZETIA Take 10 mg by mouth daily.   glimepiride 2 MG tablet Commonly known as:  AMARYL Take 2 mg by mouth daily. At 5 pm   levothyroxine 150 MCG tablet Commonly known as:  SYNTHROID, LEVOTHROID Take 150 mcg by mouth daily before  breakfast.   metaxalone 800 MG tablet Commonly known as:  SKELAXIN Take 1-2 tablets (800-1,600 mg total) by mouth 2 (two) times daily as needed for muscle spasms. What changed:    when to take this  reasons to take this   metoprolol succinate 50 MG 24 hr tablet Commonly known as:  TOPROL-XL TAKE 1 TABLET BY MOUTH EVERY DAY   nitroGLYCERIN 0.4 MG SL tablet Commonly known as:  NITROSTAT Place 0.4 mg under the tongue every 5 (five) minutes as needed for chest pain.   pantoprazole 40 MG tablet Commonly known as:  PROTONIX TAKE 1 TABLET (40 MG TOTAL) BY MOUTH 2 (TWO) TIMES DAILY. What changed:  See the new instructions.   polyethylene glycol packet Commonly known as:  MIRALAX / GLYCOLAX Take 17 g by mouth 2 (two) times daily.   rosuvastatin 40 MG tablet Commonly known as:  CRESTOR Take 1 tablet (40 mg total) by mouth daily.   senna-docusate 8.6-50 MG tablet Commonly known as:  Senokot-S Take 1 tablet by mouth 2 (two) times daily.       Allergies  Allergen Reactions  . Clopidogrel Itching    Consultations:  GI   Procedures/Studies: Dg Shoulder Right  Result Date: 09/01/2018 CLINICAL DATA:  Right shoulder injury after fall. EXAM: RIGHT SHOULDER - 2+ VIEW COMPARISON:  CT 05/11/2018. FINDINGS: Acromioclavicular and glenohumeral degenerative change. Downsloping acromion. Narrow subacromial space suggesting rotator cuff tear. No evidence of fracture or dislocation. IMPRESSION: Acromioclavicular and glenohumeral degenerative change. Downsloping acromion. Narrowing of the subacromial space suggesting rotator cuff tear. No acute abnormality identified. No evidence of fracture. Electronically Signed   By: Marcello Moores  Register   On: 09/01/2018 08:00   Nm Gi Blood Loss  Result Date: 09/04/2018 CLINICAL DATA:  Active gastrointestinal bleeding this morning. EXAM: NUCLEAR MEDICINE GASTROINTESTINAL BLEEDING SCAN TECHNIQUE: Sequential abdominal images were obtained following  intravenous administration of Tc-1m labeled red blood cells. RADIOPHARMACEUTICALS:  23.7 mCi Tc-23m pertechnetate in-vitro labeled red cells. COMPARISON:  None. FINDINGS: No tagged blood cell radiotracer activity conforming to small-bowel or colon to localize active gastrointestinal bleeding. Exam performed for 2 hours. Physiologic activity noted in the solid organs and GU tract. IMPRESSION: No evidence of active gastrointestinal bleeding over 2 hour study. Electronically Signed   By: Suzy Bouchard M.D.   On: 09/04/2018 16:12      Subjective: Denies bright red blood per rectum.  No bowel movement in the last 48 hours.   Discharge Exam: Vitals:   09/06/18 0444 09/06/18 1009  BP: 131/65 (!) 114/56  Pulse: 77 83  Resp: 18   Temp: 98.4 F (36.9 C)   SpO2: 92%    Vitals:   09/05/18 2002 09/05/18 2028 09/06/18 0444 09/06/18 1009  BP: 119/61  131/65 (!) 114/56  Pulse: 77  77 83  Resp: 19  18   Temp: 98.2 F (36.8 C)  98.4 F (36.9 C)   TempSrc: Oral  Oral   SpO2: 94% 92% 92%   Weight:      Height:        General: Pt is alert, awake, not  in acute distress Cardiovascular: RRR, S1/S2 +, no rubs, no gallops Respiratory: CTA bilaterally, no wheezing, no rhonchi Abdominal: Soft, NT, ND, bowel sounds + Extremities: no edema, no cyanosis    The results of significant diagnostics from this hospitalization (including imaging, microbiology, ancillary and laboratory) are listed below for reference.     Microbiology: No results found for this or any previous visit (from the past 240 hour(s)).   Labs: BNP (last 3 results) Recent Labs    11/13/17 1425  BNP 36.6   Basic Metabolic Panel: Recent Labs  Lab 09/03/18 0029 09/03/18 0533 09/05/18 0533  NA 138 141 140  K 5.8* 3.7 3.6  CL 100 105 103  CO2 29 29 29   GLUCOSE 146* 153* 109*  BUN 21 18 12   CREATININE 1.32* 0.98 1.12  CALCIUM 9.1 8.5* 9.0   Liver Function Tests: No results for input(s): AST, ALT, ALKPHOS, BILITOT,  PROT, ALBUMIN in the last 168 hours. No results for input(s): LIPASE, AMYLASE in the last 168 hours. No results for input(s): AMMONIA in the last 168 hours. CBC: Recent Labs  Lab 09/03/18 0029  09/04/18 0541 09/04/18 1132 09/04/18 1929 09/05/18 0533 09/06/18 0543  WBC 7.6  --   --   --   --  8.7 8.4  NEUTROABS 4.9  --   --   --   --   --   --   HGB 13.8   < > 12.3* 12.3* 12.4* 12.2* 12.7*  HCT 43.7   < > 39.4 40.0 40.0 39.6 41.1  MCV 93.0  --   --   --   --  93.0 92.6  PLT 165  --   --   --   --  152 162   < > = values in this interval not displayed.   Cardiac Enzymes: No results for input(s): CKTOTAL, CKMB, CKMBINDEX, TROPONINI in the last 168 hours. BNP: Invalid input(s): POCBNP CBG: Recent Labs  Lab 09/05/18 1710 09/05/18 2005 09/05/18 2349 09/06/18 0421 09/06/18 0832  GLUCAP 117* 173* 106* 107* 112*   D-Dimer No results for input(s): DDIMER in the last 72 hours. Hgb A1c No results for input(s): HGBA1C in the last 72 hours. Lipid Profile No results for input(s): CHOL, HDL, LDLCALC, TRIG, CHOLHDL, LDLDIRECT in the last 72 hours. Thyroid function studies No results for input(s): TSH, T4TOTAL, T3FREE, THYROIDAB in the last 72 hours.  Invalid input(s): FREET3 Anemia work up No results for input(s): VITAMINB12, FOLATE, FERRITIN, TIBC, IRON, RETICCTPCT in the last 72 hours. Urinalysis    Component Value Date/Time   COLORURINE YELLOW 11/13/2017 1718   APPEARANCEUR CLEAR 11/13/2017 1718   LABSPEC 1.013 11/13/2017 1718   PHURINE 5.0 11/13/2017 1718   GLUCOSEU NEGATIVE 11/13/2017 1718   HGBUR SMALL (A) 11/13/2017 1718   BILIRUBINUR NEGATIVE 11/13/2017 1718   KETONESUR 5 (A) 11/13/2017 1718   PROTEINUR 30 (A) 11/13/2017 1718   UROBILINOGEN 0.2 07/12/2014 1702   NITRITE NEGATIVE 11/13/2017 1718   LEUKOCYTESUR NEGATIVE 11/13/2017 1718   Sepsis Labs Invalid input(s): PROCALCITONIN,  WBC,  LACTICIDVEN Microbiology No results found for this or any previous visit  (from the past 240 hour(s)).   Time coordinating discharge: 35 minutes  SIGNED:   Elmarie Shiley, MD  [facility] 09/06/2018, 10:32 AM Pager   If 7PM-7AM, please contact night-coverage [email or web address] Password [email or web address]

## 2018-09-06 NOTE — Progress Notes (Signed)
Former patient of Dr. Howell Rucks seen by [place] during his admission for hematochezia, similar to 2013, attributed to diverticular bleeding.  Patient has not had any bowel movement or bleeding for 48 hours.  Hemoglobin has improved overnight from 12.2-12.7.  He feels well.  He has tolerated a regular diet well.  Impression: Resolved lower GI bleed, most likely of diverticular origin  Recommendations:  1.  I think it is reasonable for discharge at this time.  The patient and his wife understand that there is probably about a 5 to 10% chance of rebleeding with need for readmission, especially in the next 24 hours, but given that low probability, I think he would be more comfortable and more pleasant at home and it is worth taking that small chance.  They agree.  2.  I would recommend follow-up in the office of his primary physician, Dr. [name], sometime in the next couple of weeks, to check a hemoglobin level and to check Hemoccults (perhaps a series of home Hemoccults).  If he does show signs of persistent heme positivity, diagnostic evaluation such as colonoscopy might be needed, but otherwise, I think we can fairly safely assume that this was a diverticular bleed and not put this elderly gentleman with comorbidities through further testing.  3.  I would recommend that the patient remain off aspirin for 1 week following discharge, by which time there should be complete clot maturation and low risk of recurrent bleeding.  4.  I will sign off.  I do not think this patient needs any ongoing GI follow-up, but we would be delighted to see him in our office if he has GI symptoms in the future.  Please call me if any questions.  Cleotis Nipper, M.D. Pager [phone] If no answer or after 5 PM call [phone]

## 2018-09-06 NOTE — Progress Notes (Signed)
Pt was discharged home today. Instructions were reviewed with patient, and questions were answered. Pt was taken to main entrance via wheelchair by NT.  

## 2018-09-06 NOTE — Progress Notes (Signed)
PT Cancellation Note  Patient Details Name: [name] MRN: [id number] DOB: 23-Oct-1929   Cancelled Treatment:    Reason Eval/Treat Not Completed: PT screened, no needs identified, will sign off.  Pt reports he has been getting up in room on his own and walking to the bathroom without difficulty.  Daughter present and confirmed.  Discussed home safety.  No PT needs identified and will sign off.  Pt and daughter in agreement. Please re-order if pt's status changes.   Galen Manila 09/06/2018, 9:19 AM

## 2018-09-07 LAB — GLUCOSE, CAPILLARY: Glucose-Capillary: 120 mg/dL — ABNORMAL HIGH (ref 70–99)

## 2018-09-22 DIAGNOSIS — I5032 Chronic diastolic (congestive) heart failure: Secondary | ICD-10-CM | POA: Diagnosis not present

## 2018-09-23 DIAGNOSIS — M75121 Complete rotator cuff tear or rupture of right shoulder, not specified as traumatic: Secondary | ICD-10-CM | POA: Diagnosis not present

## 2018-09-25 DIAGNOSIS — S46001D Unspecified injury of muscle(s) and tendon(s) of the rotator cuff of right shoulder, subsequent encounter: Secondary | ICD-10-CM | POA: Diagnosis not present

## 2018-09-25 DIAGNOSIS — M6281 Muscle weakness (generalized): Secondary | ICD-10-CM | POA: Diagnosis not present

## 2018-09-29 DIAGNOSIS — S46001D Unspecified injury of muscle(s) and tendon(s) of the rotator cuff of right shoulder, subsequent encounter: Secondary | ICD-10-CM | POA: Diagnosis not present

## 2018-09-29 DIAGNOSIS — M6281 Muscle weakness (generalized): Secondary | ICD-10-CM | POA: Diagnosis not present

## 2018-10-09 DIAGNOSIS — M6281 Muscle weakness (generalized): Secondary | ICD-10-CM | POA: Diagnosis not present

## 2018-10-09 DIAGNOSIS — S46001D Unspecified injury of muscle(s) and tendon(s) of the rotator cuff of right shoulder, subsequent encounter: Secondary | ICD-10-CM | POA: Diagnosis not present

## 2018-10-12 DIAGNOSIS — S46001D Unspecified injury of muscle(s) and tendon(s) of the rotator cuff of right shoulder, subsequent encounter: Secondary | ICD-10-CM | POA: Diagnosis not present

## 2018-10-12 DIAGNOSIS — M6281 Muscle weakness (generalized): Secondary | ICD-10-CM | POA: Diagnosis not present

## 2018-10-16 DIAGNOSIS — S46001D Unspecified injury of muscle(s) and tendon(s) of the rotator cuff of right shoulder, subsequent encounter: Secondary | ICD-10-CM | POA: Diagnosis not present

## 2018-10-19 DIAGNOSIS — S46001D Unspecified injury of muscle(s) and tendon(s) of the rotator cuff of right shoulder, subsequent encounter: Secondary | ICD-10-CM | POA: Diagnosis not present

## 2018-10-19 DIAGNOSIS — M6281 Muscle weakness (generalized): Secondary | ICD-10-CM | POA: Diagnosis not present

## 2018-10-20 ENCOUNTER — Other Ambulatory Visit: Payer: Self-pay

## 2018-10-20 ENCOUNTER — Ambulatory Visit: Payer: Medicare HMO | Admitting: Family

## 2018-10-20 ENCOUNTER — Encounter: Payer: Self-pay | Admitting: Family

## 2018-10-20 VITALS — BP 131/76 | HR 73 | Temp 97.0°F | Resp 16 | Ht 64.5 in | Wt 184.0 lb

## 2018-10-20 DIAGNOSIS — I714 Abdominal aortic aneurysm, without rupture, unspecified: Secondary | ICD-10-CM

## 2018-10-20 DIAGNOSIS — R609 Edema, unspecified: Secondary | ICD-10-CM | POA: Diagnosis not present

## 2018-10-20 DIAGNOSIS — I872 Venous insufficiency (chronic) (peripheral): Secondary | ICD-10-CM | POA: Diagnosis not present

## 2018-10-20 DIAGNOSIS — I779 Disorder of arteries and arterioles, unspecified: Secondary | ICD-10-CM | POA: Diagnosis not present

## 2018-10-20 DIAGNOSIS — R0601 Orthopnea: Secondary | ICD-10-CM | POA: Diagnosis not present

## 2018-10-20 NOTE — Progress Notes (Signed)
[facility] [place]   CC: Pain in posterior thighs, improved with walking, history of peripheral artery occlusive disease  History of Present Illness [name] is a 85 y.o. male patient of Dr. Scot Dock who is s/p left iliac artery stenting in 2008, andaortogram with bilateral runoff on 12/29/13. Aortogram demonstrated no significant aortic stenosis, no significant CIA nor EIA stenoses bilaterally; bilateral internal iliac artery occlusions which likely explained the patient's symptoms, and bilateral popliteal artery occlusions.   I last saw pt on 10-30-15. At that time the pain in his lateral left hip and left groin after walking variable distances resolved with injections into the left hip by ortho, and a course of meloxicam.  He had been walking less due to the hip pain until resolved with ortho treatment.   ROS includes orthopnea with known CHF and COPD. He denies chest pain at today's visit.  He reports not taking his diuretic as he does not like getting to the rest room frequently. He sleeps in his bed or recliner with his head and chest elevated, his feet seem dependent to his chest.  He reports dry skin in his legs, denies non healing wounds in his lower extremities. His daughter with him today states that he has swelling in his lower legs; she also reports that he is unable to Eddie knee high compression hose.   He does not seem to walk enough to elicit claudication symptoms.   He has a known small AAA, largest diameter was 3.6 cm in 2017 duplex.   I last evaluated him on 10-30-15. He had chronic venous insufficiency, with no cellulitis, minimal skin changes, no stasis ulcers. He had 1-2+ pitting and non pitting edema in his lower legs and feet that resolved with overnight elevation. He was to return in a year with ABI's and left iliac artery duplex.   He has not returned until today.  Daughter states he was hospitalized for what sounds like CHF about the  time he was due to return.  He walked in the clinic today, c/o pain in posterior thighs before he gets out of bed, and after sitting for a long time, when he tries to stand, states this is worse in the last 6 months, and worsening over time. After he walks some more, the pain eases in his posterior thighs.   Diabetic: Yes, A1C was 7.3 on 07-31-18, lab results from Dr. Raul Del office, serum creatinine was 1.15 Tobacco use: former smoker, quit in the 1960's  Pt meds include: Statin :Yes Betablocker: Yes ASA: No Other anticoagulants/antiplatelets: no  Past Medical History:  Diagnosis Date  . Anxiety   . Asthma   . Chronic diastolic CHF (congestive heart failure) (HCC)    diastolic   . COPD (chronic obstructive pulmonary disease) ([place])   . Coronary artery disease    s/p PCI of RCA 03/2009 at which time there was 70% ramus and 90% RCA, cath 01/2011 showed patent stents in RCA with moderate prox disesae, aneurysmal left circ and small 90% ramus s/p PCI, patent LAD EF 55%  . Diabetes mellitus   . Diverticulitis Oct. 2013   bleeding in the past and Effient for his CAD was stopped  . DVT (deep venous thrombosis) ([place])   . GERD (gastroesophageal reflux disease)   . Hypercholesterolemia   . Hypertension   . Hypothyroidism   . Obesity (BMI 30-39.9)   . OSA (obstructive sleep apnea)    severe on CPAP  . Peripheral vascular  disease ([place]) 11/2006   s/p left stent  . Shortness of breath   . Sleep apnea    severe OSA awaiting CPAP titration    Social History Social History   Tobacco Use  . Smoking status: Former Smoker    Packs/day: 0.50    Years: 15.00    Pack years: 7.50    Types: Cigarettes, Cigars    Last attempt to quit: 10/15/1963    Years since quitting: 55.0  . Smokeless tobacco: Never Used  Substance Use Topics  . Alcohol use: No  . Drug use: No    Family History Family History  Problem Relation Age of Onset  . Diabetes Mother   . Hyperlipidemia Mother   .  Hypertension Mother   . Heart attack Mother   . Heart disease Mother        before age 70  . Diabetes Brother   . Heart disease Father        before age 77  . Breast cancer Sister   . Diabetes Sister   . Hyperlipidemia Sister   . Hypertension Sister   . Heart attack Sister   . Hyperlipidemia Sister   . Hypertension Sister   . Heart disease Sister        Heart Disease before age 69  . Asthma Sister   . Malignant hyperthermia Neg Hx     Past Surgical History:  Procedure Laterality Date  . ABDOMINAL AORTAGRAM N/A 04/20/2012   Procedure: ABDOMINAL Maxcine Ham;  Surgeon: Angelia Mould, MD;  Location[facility] CATH LAB;  Service: Cardiovascular;  Laterality: N/A;  . ABDOMINAL AORTAGRAM N/A 12/29/2012   Procedure: ABDOMINAL Maxcine Ham;  Surgeon: Serafina Mitchell, MD;  Location: [facility] CATH LAB;  Service: Cardiovascular;  Laterality: N/A;  . BALLOON DILATION  12/26/2011   Procedure: BALLOON DILATION;  Surgeon: Garlan Fair, MD;  Location: [facility] ENDOSCOPY;  Service: Endoscopy;  Laterality: N/A;  . COLONOSCOPY  08/14/2012   Procedure: COLONOSCOPY;  Surgeon: Arta Silence, MD;  Location: [facility] ENDOSCOPY;  Service: Endoscopy;  Laterality: N/A;  . CORONARY STENT PLACEMENT  2010 and 2012  . ESOPHAGOGASTRODUODENOSCOPY  12/26/2011   Procedure: ESOPHAGOGASTRODUODENOSCOPY (EGD);  Surgeon: Garlan Fair, MD;  Location: [facility] ENDOSCOPY;  Service: Endoscopy;  Laterality: N/A;  . ESOPHAGOGASTRODUODENOSCOPY  08/13/2012   Procedure: ESOPHAGOGASTRODUODENOSCOPY (EGD);  Surgeon: Arta Silence, MD;  Location: [facility] ENDOSCOPY;  Service: Endoscopy;  Laterality: Left;  . HERNIA REPAIR  1992   evntral hernia repair  . LOWER EXTREMITY ANGIOGRAM Bilateral 12/29/2012   Procedure: LOWER EXTREMITY ANGIOGRAM;  Surgeon: Serafina Mitchell, MD;  Location: [facility] CATH LAB;  Service: Cardiovascular;  Laterality: Bilateral;  bilat lower extrem angio    Allergies  Allergen Reactions  . Clopidogrel Itching    Current Outpatient  Medications  Medication Sig Dispense Refill  . albuterol (PROVENTIL HFA;VENTOLIN HFA) 108 (90 BASE) MCG/ACT inhaler Inhale 2 puffs into the lungs every 6 (six) hours as needed for wheezing or shortness of breath.     Marland Kitchen albuterol (PROVENTIL) (2.5 MG/3ML) 0.083% nebulizer solution Take 3 mLs (2.5 mg total) by nebulization every 4 (four) hours as needed for wheezing or shortness of breath. 75 mL 0  . budesonide-formoterol (SYMBICORT) 160-4.5 MCG/ACT inhaler Inhale 2 puffs into the lungs 2 (two) times daily.    Marland Kitchen ezetimibe (ZETIA) 10 MG tablet Take 10 mg by mouth daily.  3  . glimepiride (AMARYL) 2 MG tablet Take 2 mg by mouth daily. At 5 pm    .  levothyroxine (SYNTHROID, LEVOTHROID) 150 MCG tablet Take 150 mcg by mouth daily before breakfast.    . metaxalone (SKELAXIN) 800 MG tablet Take 1-2 tablets (800-1,600 mg total) by mouth 2 (two) times daily as needed for muscle spasms. 1 tablet 0  . metoprolol succinate (TOPROL-XL) 50 MG 24 hr tablet TAKE 1 TABLET BY MOUTH EVERY DAY 30 tablet 8  . nitroGLYCERIN (NITROSTAT) 0.4 MG SL tablet Place 0.4 mg under the tongue every 5 (five) minutes as needed for chest pain.    . pantoprazole (PROTONIX) 40 MG tablet TAKE 1 TABLET (40 MG TOTAL) BY MOUTH 2 (TWO) TIMES DAILY. (Patient taking differently: Take 40 mg by mouth 2 (two) times daily. ) 60 tablet 5  . polyethylene glycol (MIRALAX / GLYCOLAX) packet Take 17 g by mouth 2 (two) times daily. 14 each 0  . rosuvastatin (CRESTOR) 40 MG tablet Take 1 tablet (40 mg total) by mouth daily. 90 tablet 3  . senna-docusate (SENOKOT-S) 8.6-50 MG tablet Take 1 tablet by mouth 2 (two) times daily. 10 tablet 0  . Spacer/Aero-Holding Chambers (AEROCHAMBER MV) inhaler Use as instructed 1 each 0   No current facility-administered medications for this visit.     ROS: See HPI for pertinent positives and negatives.   Physical Examination  Vitals:   10/20/18 1127  BP: 131/76  Pulse: 73  Resp: 16  Temp: (!) 97 F (36.1 C)   TempSrc: Oral  SpO2: 93%  Weight: 184 lb (83.5 kg)  Height: 5' 4.5" (1.638 m)   Body mass index is 31.1 kg/m.  General: A&O x 3, WDWN, obese male. Gait: slow, steady HENT: large short neck Eyes: PERRLA. Pulmonary: Respirations are slightly labored at rest labored, limited air movement in all fields, no rales, rhonchi, or wheezes. + moist tight cough occasionally. Is unable to tolerate head of exam table any lower than 45 degrees, become more dyspenic.  Cardiac: regular rhythm, no detected murmur.         Carotid Bruits Right Left   Negative Negative   Radial pulses are 2+ palpable bilaterally   Adominal aortic pulse is not palpable                         VASCULAR EXAM: Extremities without ischemic changes, without Gangrene; without open wounds. 2+ bilateral pretibial and ankle non pitting edema                                                                                                          LE Pulses Right Left       FEMORAL  2+ palpable  2+ palpable        POPLITEAL  not palpable   not palpable       POSTERIOR TIBIAL  not palpable, brisk monophasic Doppler signal   not palpable, no Doppler signal        DORSALIS PEDIS      ANTERIOR TIBIAL not palpable, no Doppler signal  not palpable, brisk Doppler signal    Abdomen: soft, NT, no  palpable masses. Skin: no rashes, no cellulitis, no ulcers noted. Musculoskeletal: no muscle wasting or atrophy.  Neurologic: A&O X 3; appropriate affect, Sensation is normal; MOTOR FUNCTION:  moving all extremities equally, motor strength 5/5 in upper extremities, 4/5 in lower extremites. Speech is fluent/normal. CN 2-12 intact. Psychiatric: Thought content is normal, mood appropriate for clinical situation.     ASSESSMENT: [name] is a 85 y.o. male who is s/p left common iliac artery stent and angioplasty in 2008.   - 6 months history of pain in posterior thighs before he gets out of bed and after long periods of sitting,  improves with walking. This is not consistent with claudication type pain, although he has a history of bilateral iliac artery occlusions and bilateral popliteal artery occlusions noted on 2015 aortogram.  He does not seem to walk enough to elicit claudication.  He does not take his diuretic per daughter, no diuretic on his list of medications, no KCL on his list either.    - CHF with chronic venous insufficiency and non pitting dependent edema of bilateral LE:  Feet elevation above heart overnight and whenever he is sitting, as much as possible, to minimize dependent edema.   - Orthopnea: he keeps his head elevated higher than 45 degrees.   His lower legs were measured for knee high compression hose: Length: 17-18 inches, calf: 14.5 inches, ankle: 11.5 inches. Daughter will obtain these from [facility], along with pillow with knee slight flexion for legs elevation.   See Plan and Patient Instructions.    - history of small AAA: will evaluate by duplex in 2 weeks  Greater than 50% of 30 minutes was spent in counseling and coordination of care with the patient and his daughter.    DATA  Left Aortoiliac Duplex (10-30-15): Patent left CIA stent with monophasic left external iliac artery. Doppler waveforms suggestive of a possible significant CIA level stenosis, based on limited visualization. Known distal abdominal aortic aneurysm with maximum diameter of 3.5 cm x 3.6 cm, based on limited visualization. Unable to adequately compare to the previous exam on 08/03/14 since the stent could not be adequately visualized today. No significant change in the AAA when compared to the previous exam on 05/21/13 when the maximum diameter was 3.29 cm.  ABI (Date: 10/30/2015):  R: 0.76 (0.84, 08/03/14), DP: monphasic, PT: monophasic, TBI: 0.51             L: 0.62 (0.79), DP: monophasic, PT: not detected, peroneal: monophasic, TBI: 0.53    PLAN:  Daily seated leg exercises.   Elevate feet above the  heart whenever he is not walking, to the extent possible. Knee high graduated compression hose, 20-30 or 15-20 mm Hg. Obtain donning device at [facility].  Pamphlet about [facility] given to pt and daughter  Based on the patient's vascular studies and examination, pt will return to clinic in 2 weeks with ABI's, and bilateral aorto- iliac artery duplex.  I discussed in depth with the patient the nature of atherosclerosis, and emphasized the importance of maximal medical management including strict control of blood pressure, blood glucose, and lipid levels, obtaining regular exercise, and continued cessation of smoking.  The patient is aware that without maximal medical management the underlying atherosclerotic disease process will progress, limiting the benefit of any interventions.  The patient was given information about PAD including signs, symptoms, treatment, what symptoms should prompt the patient to seek immediate medical care, and risk reduction measures to take.  Vinnie Level Eyonna Sandstrom, RN,  MSN, FNP-C [facility] Phone: [phone]  Clinic MD: Bishop Dublin  10/20/18 12:31 PM

## 2018-10-20 NOTE — Patient Instructions (Addendum)
  To decrease swelling in your feet and legs: Elevate feet above slightly bent knees, feet above heart, overnight and 3-4 times per day for 20 minutes.   Before your next abdominal ultrasound:  Avoid gas forming foods and beverages the day before the test.   Take two Extra-Strength Gas-X capsules at bedtime the night before the test. Take another two Extra-Strength Gas-X capsules in the middle of the night if you get up to the restroom, if not, first thing in the morning with water.  Do not chew gum.      Peripheral Vascular Disease  Peripheral vascular disease (PVD) is a disease of the blood vessels that are not part of your heart and brain. A simple term for PVD is poor circulation. In most cases, PVD narrows the blood vessels that carry blood from your heart to the rest of your body. This can reduce the supply of blood to your arms, legs, and internal organs, like your stomach or kidneys. However, PVD most often affects a person's lower legs and feet. Without treatment, PVD tends to get worse. PVD can also lead to acute ischemic limb. This is when an arm or leg suddenly cannot get enough blood. This is a medical emergency. Follow these instructions at home: Lifestyle  Do not use any products that contain nicotine or tobacco, such as cigarettes and e-cigarettes. If you need help quitting, ask your doctor.  Lose weight if you are overweight. Or, stay at a healthy weight as told by your doctor.  Eat a diet that is low in fat and cholesterol. If you need help, ask your doctor.  Exercise regularly. Ask your doctor for activities that are right for you. General instructions  Take over-the-counter and prescription medicines only as told by your doctor.  Take good care of your feet: ? Wear comfortable shoes that fit well. ? Check your feet often for any cuts or sores.  Keep all follow-up visits as told by your doctor This is important. Contact a doctor if:  You have cramps in your  legs when you walk.  You have leg pain when you are at rest.  You have coldness in a leg or foot.  Your skin changes.  You are unable to get or have an erection (erectile dysfunction).  You have cuts or sores on your feet that do not heal. Get help right away if:  Your arm or leg turns cold, numb, and blue.  Your arms or legs become red, warm, swollen, painful, or numb.  You have chest pain.  You have trouble breathing.  You suddenly have weakness in your face, arm, or leg.  You become very confused or you cannot speak.  You suddenly have a very bad headache.  You suddenly cannot see. Summary  Peripheral vascular disease (PVD) is a disease of the blood vessels.  A simple term for PVD is poor circulation. Without treatment, PVD tends to get worse.  Treatment may include exercise, low fat and low cholesterol diet, and quitting smoking. This information is not intended to replace advice given to you by your health care provider. Make sure you discuss any questions you have with your health care provider. Document Released: 12/25/2009 Document Revised: 11/07/2016 Document Reviewed: 11/07/2016 Elsevier Interactive Patient Education  2019 [employer].

## 2018-10-21 DIAGNOSIS — S46001D Unspecified injury of muscle(s) and tendon(s) of the rotator cuff of right shoulder, subsequent encounter: Secondary | ICD-10-CM | POA: Diagnosis not present

## 2018-10-21 DIAGNOSIS — M6281 Muscle weakness (generalized): Secondary | ICD-10-CM | POA: Diagnosis not present

## 2018-10-23 ENCOUNTER — Other Ambulatory Visit: Payer: Self-pay

## 2018-10-23 DIAGNOSIS — R609 Edema, unspecified: Secondary | ICD-10-CM

## 2018-10-23 DIAGNOSIS — I5032 Chronic diastolic (congestive) heart failure: Secondary | ICD-10-CM | POA: Diagnosis not present

## 2018-10-23 DIAGNOSIS — I872 Venous insufficiency (chronic) (peripheral): Secondary | ICD-10-CM

## 2018-10-23 DIAGNOSIS — I779 Disorder of arteries and arterioles, unspecified: Secondary | ICD-10-CM

## 2018-10-28 DIAGNOSIS — M75121 Complete rotator cuff tear or rupture of right shoulder, not specified as traumatic: Secondary | ICD-10-CM | POA: Diagnosis not present

## 2018-11-04 ENCOUNTER — Ambulatory Visit (INDEPENDENT_AMBULATORY_CARE_PROVIDER_SITE_OTHER)
Admission: RE | Admit: 2018-11-04 | Discharge: 2018-11-04 | Disposition: A | Discharge status: Home / Self Care | Payer: Medicare HMO | Source: Ambulatory Visit | Attending: Family | Admitting: Family

## 2018-11-04 ENCOUNTER — Ambulatory Visit (HOSPITAL_COMMUNITY)
Admission: RE | Admit: 2018-11-04 | Discharge: 2018-11-04 | Disposition: A | Discharge status: Home / Self Care | Payer: Medicare HMO | Source: Ambulatory Visit | Attending: Family | Admitting: Family

## 2018-11-04 DIAGNOSIS — R609 Edema, unspecified: Secondary | ICD-10-CM | POA: Insufficient documentation

## 2018-11-04 DIAGNOSIS — I779 Disorder of arteries and arterioles, unspecified: Secondary | ICD-10-CM | POA: Diagnosis not present

## 2018-11-04 DIAGNOSIS — I872 Venous insufficiency (chronic) (peripheral): Secondary | ICD-10-CM

## 2018-11-04 NOTE — Progress Notes (Signed)
HISTORY AND PHYSICAL     CC:  follow up. Requesting Provider:  [name], MD  HPI: This is a 85 y.o. male who is here today for follow up.  He is s/p left iliac artery stenting in 2008, andaortogram with bilateral runoff on 12/29/13 by Dr. Scot Dock.  He was seen in 2017 by NP and at that time, he did have pain in the lateral left hip and groin but was relieved with injections into the left hip by ortho and a course of Meloxicam.  He was seen on 10/20/2018 again by NP and at that time he had swelling of his lower legs.  He is unable to put on compression socks for his chronic venous insufficiency.  He had c/o pain in his posterior thighs before getting out of bed and after sitting for long periods of time.  It has gotten worse trying to stand over the past 6 months.  After he walks some, the pain eases in his posterior thighs.   He presents today with his daughter with the same complaints and to get the results of his tests.   He continues to have the same sx as when he saw Vinnie Level (NP) a couple of weeks ago.  His left leg bothers him.  He has pain in his posterior thigh when he stands after sitting for long periods of time and when he gets up out of bed in the mornings.  This improves with walking.  His gait is a bit unsteady and he has to use a cane.  Pt states he does not have any issues with his right leg.  He does not have pain in bilateral feet or lower legs.   He does have BLE edema.  He does not wear his compression stockings as he cannot put them on.  He did go to [employer] and buy the lounge doctor for leg elevation and compression socks but has not used either one.  He cannot lay flat (cuts off his air) so difficult for him to elevate his legs.    He states that he enjoys fishing and has 4 boats.  He enjoys going to church.  He does not drink. He quit smoking in the 60's.   The pt is on a statin for cholesterol management.    The pt does have diabetes and is on oral agents The pt is  on a beta blocker for for hypertension.  He uses inhalers for COPD  Past Medical History:  Diagnosis Date  . Anxiety   . Asthma   . Chronic diastolic CHF (congestive heart failure) (HCC)    diastolic   . COPD (chronic obstructive pulmonary disease) ([place])   . Coronary artery disease    s/p PCI of RCA 03/2009 at which time there was 70% ramus and 90% RCA, cath 01/2011 showed patent stents in RCA with moderate prox disesae, aneurysmal left circ and small 90% ramus s/p PCI, patent LAD EF 55%  . Diabetes mellitus   . Diverticulitis Oct. 2013   bleeding in the past and Effient for his CAD was stopped  . DVT (deep venous thrombosis) ([place])   . GERD (gastroesophageal reflux disease)   . Hypercholesterolemia   . Hypertension   . Hypothyroidism   . Obesity (BMI 30-39.9)   . OSA (obstructive sleep apnea)    severe on CPAP  . Peripheral vascular disease ([place]) 11/2006   s/p left stent  . Shortness of breath   . Sleep apnea  severe OSA awaiting CPAP titration    Past Surgical History:  Procedure Laterality Date  . ABDOMINAL AORTAGRAM N/A 04/20/2012   Procedure: ABDOMINAL Maxcine Ham;  Surgeon: Angelia Mould, MD;  Location: [facility] CATH LAB;  Service: Cardiovascular;  Laterality: N/A;  . ABDOMINAL AORTAGRAM N/A 12/29/2012   Procedure: ABDOMINAL Maxcine Ham;  Surgeon: Serafina Mitchell, MD;  Location: [facility] CATH LAB;  Service: Cardiovascular;  Laterality: N/A;  . BALLOON DILATION  12/26/2011   Procedure: BALLOON DILATION;  Surgeon: Garlan Fair, MD;  Location: [facility] ENDOSCOPY;  Service: Endoscopy;  Laterality: N/A;  . COLONOSCOPY  08/14/2012   Procedure: COLONOSCOPY;  Surgeon: Arta Silence, MD;  Location: [facility] ENDOSCOPY;  Service: Endoscopy;  Laterality: N/A;  . CORONARY STENT PLACEMENT  2010 and 2012  . ESOPHAGOGASTRODUODENOSCOPY  12/26/2011   Procedure: ESOPHAGOGASTRODUODENOSCOPY (EGD);  Surgeon: Garlan Fair, MD;  Location: [facility] ENDOSCOPY;  Service: Endoscopy;  Laterality: N/A;  .  ESOPHAGOGASTRODUODENOSCOPY  08/13/2012   Procedure: ESOPHAGOGASTRODUODENOSCOPY (EGD);  Surgeon: Arta Silence, MD;  Location: [facility] ENDOSCOPY;  Service: Endoscopy;  Laterality: Left;  . HERNIA REPAIR  1992   evntral hernia repair  . LOWER EXTREMITY ANGIOGRAM Bilateral 12/29/2012   Procedure: LOWER EXTREMITY ANGIOGRAM;  Surgeon: Serafina Mitchell, MD;  Location: [facility] CATH LAB;  Service: Cardiovascular;  Laterality: Bilateral;  bilat lower extrem angio    Allergies  Allergen Reactions  . Clopidogrel Itching    Current Outpatient Medications  Medication Sig Dispense Refill  . albuterol (PROVENTIL HFA;VENTOLIN HFA) 108 (90 BASE) MCG/ACT inhaler Inhale 2 puffs into the lungs every 6 (six) hours as needed for wheezing or shortness of breath.     Marland Kitchen albuterol (PROVENTIL) (2.5 MG/3ML) 0.083% nebulizer solution Take 3 mLs (2.5 mg total) by nebulization every 4 (four) hours as needed for wheezing or shortness of breath. 75 mL 0  . budesonide-formoterol (SYMBICORT) 160-4.5 MCG/ACT inhaler Inhale 2 puffs into the lungs 2 (two) times daily.    Marland Kitchen ezetimibe (ZETIA) 10 MG tablet Take 10 mg by mouth daily.  3  . glimepiride (AMARYL) 2 MG tablet Take 2 mg by mouth daily. At 5 pm    . levothyroxine (SYNTHROID, LEVOTHROID) 150 MCG tablet Take 150 mcg by mouth daily before breakfast.    . metaxalone (SKELAXIN) 800 MG tablet Take 1-2 tablets (800-1,600 mg total) by mouth 2 (two) times daily as needed for muscle spasms. 1 tablet 0  . metoprolol succinate (TOPROL-XL) 50 MG 24 hr tablet TAKE 1 TABLET BY MOUTH EVERY DAY 30 tablet 8  . nitroGLYCERIN (NITROSTAT) 0.4 MG SL tablet Place 0.4 mg under the tongue every 5 (five) minutes as needed for chest pain.    . pantoprazole (PROTONIX) 40 MG tablet TAKE 1 TABLET (40 MG TOTAL) BY MOUTH 2 (TWO) TIMES DAILY. (Patient taking differently: Take 40 mg by mouth 2 (two) times daily. ) 60 tablet 5  . polyethylene glycol (MIRALAX / GLYCOLAX) packet Take 17 g by mouth 2 (two) times daily.  14 each 0  . rosuvastatin (CRESTOR) 40 MG tablet Take 1 tablet (40 mg total) by mouth daily. 90 tablet 3  . senna-docusate (SENOKOT-S) 8.6-50 MG tablet Take 1 tablet by mouth 2 (two) times daily. 10 tablet 0  . Spacer/Aero-Holding Chambers (AEROCHAMBER MV) inhaler Use as instructed 1 each 0   No current facility-administered medications for this visit.     Family History  Problem Relation Age of Onset  . Diabetes Mother   . Hyperlipidemia Mother   . Hypertension Mother   .  Heart attack Mother   . Heart disease Mother        before age 13  . Diabetes Brother   . Heart disease Father        before age 47  . Breast cancer Sister   . Diabetes Sister   . Hyperlipidemia Sister   . Hypertension Sister   . Heart attack Sister   . Hyperlipidemia Sister   . Hypertension Sister   . Heart disease Sister        Heart Disease before age 68  . Asthma Sister   . Malignant hyperthermia Neg Hx     Social History   Socioeconomic History  . Marital status: Widowed    Spouse name: Not on file  . Number of children: Not on file  . Years of education: Not on file  . Highest education level: Not on file  Occupational History  . Not on file  Social Needs  . Financial resource strain: Not on file  . Food insecurity:    Worry: Not on file    Inability: Not on file  . Transportation needs:    Medical: Not on file    Non-medical: Not on file  Tobacco Use  . Smoking status: Former Smoker    Packs/day: 0.50    Years: 15.00    Pack years: 7.50    Types: Cigarettes, Cigars    Last attempt to quit: 10/15/1963    Years since quitting: 55.0  . Smokeless tobacco: Never Used  Substance and Sexual Activity  . Alcohol use: No  . Drug use: No  . Sexual activity: Never  Lifestyle  . Physical activity:    Days per week: Not on file    Minutes per session: Not on file  . Stress: Not on file  Relationships  . Social connections:    Talks on phone: Not on file    Gets together: Not on file     Attends religious service: Not on file    Active member of club or organization: Not on file    Attends meetings of clubs or organizations: Not on file    Relationship status: Not on file  . Intimate partner violence:    Fear of current or ex partner: Not on file    Emotionally abused: Not on file    Physically abused: Not on file    Forced sexual activity: Not on file  Other Topics Concern  . Not on file  Social History Narrative  . Not on file     REVIEW OF SYSTEMS:   [X]  denotes positive finding, [ ]  denotes negative finding Cardiac  Comments:  Chest pain or chest pressure:    Shortness of breath upon exertion:    Short of breath when lying flat:    Irregular heart rhythm:        Vascular    Pain in calf, thigh, or hip brought on by ambulation:    Pain in feet at night that wakes you up from your sleep:     Blood clot in your veins:    Leg swelling:         Pulmonary    Oxygen at home:    Productive cough:     Wheezing:         Neurologic    Sudden weakness in arms or legs:     Sudden numbness in arms or legs:     Sudden onset of difficulty speaking or slurred speech:  Temporary loss of vision in one eye:     Problems with dizziness:         Gastrointestinal    Blood in stool:     Vomited blood:         Genitourinary    Burning when urinating:     Blood in urine:        Psychiatric    Major depression:         Hematologic    Bleeding problems:    Problems with blood clotting too easily:        Skin    Rashes or ulcers:        Constitutional    Fever or chills:      PHYSICAL EXAMINATION:  Today's Vitals   11/06/18 1040  BP: 124/69  Pulse: 90  Resp: 20  Temp: 97.9 F (36.6 C)  SpO2: 95%  Weight: 184 lb (83.5 kg)  Height: 5' 4.5" (1.638 m)  PainSc: 5    Body mass index is 31.1 kg/m.   General:  WDWN in NAD; vital signs documented above Gait: Unsteady HENT: WNL, normocephalic Pulmonary: normal non-labored breathing , without  Rales, rhonchi,  wheezing Cardiac: regular HR, without  Murmurs; without carotid bruits Abdomen: soft, NT, no masses Skin: without rashes Vascular Exam/Pulses:  Right Left  Radial 2+ (normal) 2+ (normal)  Ulnar 2+ (normal) 2+ (normal)  Popliteal Unable to palpate  Unable to palpate   DP Unable to palpate  Unable to palpate   PT Unable to palpate  Unable to palpate    Extremities: without ischemic changes, without Gangrene , without cellulitis; without open wounds; +BLE edema Musculoskeletal: no muscle wasting or atrophy  Neurologic: A&O X 3;  No focal weakness or paresthesias are detected Psychiatric:  The pt has Normal affect.   Non-Invasive Vascular Imaging:   ABI's/TBI's on 11/04/2018: Right:  0.82/0.75 Left:  0.70/0.57  BLE arterial duplex 11/04/2018: Right: 30-49% stenosis noted in the superficial femoral artery.  Left: 30-49% stenosis noted in the common femoral artery. 30-49% stenosis noted in the superficial femoral artery. Short segment occlusion vs near occlusion noted mid popliteal artery. Unable to detect flow in distal PTA.  Left Aortoiliac Duplex (10-30-15): Patent left CIA stent with monophasic left external iliac artery. Doppler waveforms suggestive of a possible significant CIA level stenosis, based on limited visualization. Known distal abdominal aortic aneurysm with maximum diameter of 3.5 cm x 3.6 cm, based on limited visualization. Unable to adequately compare to the previous exam on 08/03/14 since the stent could not be adequately visualized today. No significant change in the AAA when compared to the previous exam on 05/21/13 when the maximum diameter was 3.29 cm.  Previous ABI's on 10/30/15: Right:  0.76/0.51 (M) Left:  0.62/0.53 (M) (PT not detected)  Pt meds includes: Statin:  Yes.   Beta Blocker:  Yes.   Aspirin:  No. ACEI:  No. ARB:  No. CCB use:  No Other Antiplatelet/Anticoagulant:  No   ASSESSMENT/PLAN:: 85 y.o. male here for follow up  for s/p left iliac artery stenting in 2008, andaortogram with bilateral runoff on 12/29/12 by Dr. Trula Slade   -pt with LLE posterior thigh pain that is not consistent with claudication.  He does not have pain in his right leg or below his knee on the left.  His ABI's are essentially unchanged.  He has known bilateral popliteal occlusions.  -he does have 30-49% stenosis in the left CFA and SFA as well as the right SFA.   -  he has known AAA-this was not measured on duplex, but asked the tech to measure on pictures and it ~ 3.4cm.  He is asymptomatic and would not repair until greater than 5cm -his pain may be related to musculoskeletal issues.  He does have a physician at [facility].  He will f/u with them.   -he does have BLE edema.  He does not wear his compression stockings as he cannot put them on.  He did go to [employer] and buy the lounge doctor for leg elevation and compression socks but have not used either one.  Urged him to elevate his legs the best he can as he cannot lay flat.  Also instructed him that if he can at least wear the 15-60mmHg compression socks, it would help him.  Advised him to put them on prior to getting out of bed and taking them off when he goes to bed.  -he will f/u with [place] in 6 months.   Leontine Locket, PA-C [facility] [phone]  Clinic MD:   Donzetta Matters

## 2018-11-05 DIAGNOSIS — K922 Gastrointestinal hemorrhage, unspecified: Secondary | ICD-10-CM | POA: Diagnosis not present

## 2018-11-05 DIAGNOSIS — E7800 Pure hypercholesterolemia, unspecified: Secondary | ICD-10-CM | POA: Diagnosis not present

## 2018-11-05 DIAGNOSIS — G4733 Obstructive sleep apnea (adult) (pediatric): Secondary | ICD-10-CM | POA: Diagnosis not present

## 2018-11-05 DIAGNOSIS — J449 Chronic obstructive pulmonary disease, unspecified: Secondary | ICD-10-CM | POA: Diagnosis not present

## 2018-11-05 DIAGNOSIS — E039 Hypothyroidism, unspecified: Secondary | ICD-10-CM | POA: Diagnosis not present

## 2018-11-05 DIAGNOSIS — I110 Hypertensive heart disease with heart failure: Secondary | ICD-10-CM | POA: Diagnosis not present

## 2018-11-05 DIAGNOSIS — Z23 Encounter for immunization: Secondary | ICD-10-CM | POA: Diagnosis not present

## 2018-11-05 DIAGNOSIS — E1159 Type 2 diabetes mellitus with other circulatory complications: Secondary | ICD-10-CM | POA: Diagnosis not present

## 2018-11-05 DIAGNOSIS — E1165 Type 2 diabetes mellitus with hyperglycemia: Secondary | ICD-10-CM | POA: Diagnosis not present

## 2018-11-05 DIAGNOSIS — Z6836 Body mass index (BMI) 36.0-36.9, adult: Secondary | ICD-10-CM | POA: Diagnosis not present

## 2018-11-05 DIAGNOSIS — I5030 Unspecified diastolic (congestive) heart failure: Secondary | ICD-10-CM | POA: Diagnosis not present

## 2018-11-05 DIAGNOSIS — Z7984 Long term (current) use of oral hypoglycemic drugs: Secondary | ICD-10-CM | POA: Diagnosis not present

## 2018-11-06 ENCOUNTER — Ambulatory Visit (INDEPENDENT_AMBULATORY_CARE_PROVIDER_SITE_OTHER): Payer: Medicare HMO | Admitting: Physician Assistant

## 2018-11-06 ENCOUNTER — Other Ambulatory Visit: Payer: Self-pay

## 2018-11-06 ENCOUNTER — Encounter: Payer: Self-pay | Admitting: Physician Assistant

## 2018-11-06 VITALS — BP 124/69 | HR 90 | Temp 97.9°F | Resp 20 | Ht 64.5 in | Wt 184.0 lb

## 2018-11-06 DIAGNOSIS — I714 Abdominal aortic aneurysm, without rupture, unspecified: Secondary | ICD-10-CM

## 2018-11-06 DIAGNOSIS — R609 Edema, unspecified: Secondary | ICD-10-CM | POA: Diagnosis not present

## 2018-11-06 DIAGNOSIS — I779 Disorder of arteries and arterioles, unspecified: Secondary | ICD-10-CM | POA: Diagnosis not present

## 2018-11-10 ENCOUNTER — Telehealth: Payer: Self-pay

## 2018-11-10 DIAGNOSIS — E785 Hyperlipidemia, unspecified: Secondary | ICD-10-CM

## 2018-11-10 NOTE — Telephone Encounter (Signed)
-----   Message from Traci R [place], MD sent at 11/06/2018  6:06 PM EST ----- Lipids still not at goal.  Please forward to lipid clinic for further recommendations. 

## 2018-11-10 NOTE — Telephone Encounter (Signed)
Pt verbalized understanding and agreed to a Lipid Clinic referral.

## 2018-11-19 DIAGNOSIS — M545 Low back pain: Secondary | ICD-10-CM | POA: Diagnosis not present

## 2018-11-20 DIAGNOSIS — Z8719 Personal history of other diseases of the digestive system: Secondary | ICD-10-CM | POA: Diagnosis not present

## 2018-11-20 DIAGNOSIS — I110 Hypertensive heart disease with heart failure: Secondary | ICD-10-CM | POA: Diagnosis not present

## 2018-11-20 DIAGNOSIS — J449 Chronic obstructive pulmonary disease, unspecified: Secondary | ICD-10-CM | POA: Diagnosis not present

## 2018-11-20 DIAGNOSIS — Z6837 Body mass index (BMI) 37.0-37.9, adult: Secondary | ICD-10-CM | POA: Diagnosis not present

## 2018-11-20 DIAGNOSIS — I2510 Atherosclerotic heart disease of native coronary artery without angina pectoris: Secondary | ICD-10-CM | POA: Diagnosis not present

## 2018-11-23 DIAGNOSIS — I5032 Chronic diastolic (congestive) heart failure: Secondary | ICD-10-CM | POA: Diagnosis not present

## 2018-11-27 DIAGNOSIS — M5416 Radiculopathy, lumbar region: Secondary | ICD-10-CM | POA: Diagnosis not present

## 2018-11-27 DIAGNOSIS — M62838 Other muscle spasm: Secondary | ICD-10-CM | POA: Diagnosis not present

## 2018-11-30 ENCOUNTER — Ambulatory Visit (INDEPENDENT_AMBULATORY_CARE_PROVIDER_SITE_OTHER): Payer: Medicare HMO

## 2018-11-30 DIAGNOSIS — I10 Essential (primary) hypertension: Secondary | ICD-10-CM

## 2018-11-30 MED ORDER — ROSUVASTATIN CALCIUM 20 MG PO TABS: 20.0000 mg | ORAL_TABLET | Freq: Every day | ORAL | 11 refills | Status: DC

## 2018-11-30 NOTE — Progress Notes (Signed)
Patient ID: [name]                 DOB: 01/02/1930                    MRN: [id number]     HPI: [name] is a 85 y.o. male patient referred to lipid clinic by Dr. Radford Pax. PMH is significant for CAD (S/P PCI of RCA 03/2009 with residual 70% ramus and 90% RCA, cath 01/2011 showed patent stents in RCA with moderate proximal disease, aneurysmal left circumflex and small 90% ramus status post PCI), HTN, dyslipidemia, chronic diastolic CHF, obesity and OSA on CPAP.  Patient presents to lipid clinic today with his daughter who helps take care of him. They report that he has been having cramping in his left leg for the past few months. He says that because of this cramping, he has difficulty getting out of bed in the morning and going to the bathroom. He says that other providers have said that the Crestor may be causing this. Patient and his daughter also report that he has a poor appetite, and that when he does eat, it may just be 1 slice of bread.  Current Medications: Crestor 40 daily, Zetia 10 daily Intolerances: lipitor ("made him very sick") Risk Factors: obesity, CAD s/p PCI LDL goal: <70  Diet: poor appetite  Exercise: unable to do physical activity due to leg cramping  Family History: Asthma in his sister; Breast cancer in his sister; Diabetes in his brother, mother, and sister; Heart attack in his mother and sister; Heart disease in his father, mother, and sister; Hyperlipidemia in his mother, sister, and sister; Hypertension in his mother, sister, and sister. There is no history of Malignant hyperthermia.  Social History: former smoker (quit 1965, pack years 7.5)  Labs: 11/05/18: TC 144, LDL 80, HDL 48, TG 76 (Crestor 40mg  daily, Zetia 10mg  daily)  Past Medical History:  Diagnosis Date  . Anxiety   . Asthma   . Chronic diastolic CHF (congestive heart failure) (HCC)    diastolic   . COPD (chronic obstructive pulmonary disease) ([place])   . Coronary artery disease    s/p PCI  of RCA 03/2009 at which time there was 70% ramus and 90% RCA, cath 01/2011 showed patent stents in RCA with moderate prox disesae, aneurysmal left circ and small 90% ramus s/p PCI, patent LAD EF 55%  . Diabetes mellitus   . Diverticulitis Oct. 2013   bleeding in the past and Effient for his CAD was stopped  . DVT (deep venous thrombosis) ([place])   . GERD (gastroesophageal reflux disease)   . Hypercholesterolemia   . Hypertension   . Hypothyroidism   . Obesity (BMI 30-39.9)   . OSA (obstructive sleep apnea)    severe on CPAP  . Peripheral vascular disease ([place]) 11/2006   s/p left stent  . Shortness of breath   . Sleep apnea    severe OSA awaiting CPAP titration    Current Outpatient Medications on File Prior to Visit  Medication Sig Dispense Refill  . albuterol (PROVENTIL HFA;VENTOLIN HFA) 108 (90 BASE) MCG/ACT inhaler Inhale 2 puffs into the lungs every 6 (six) hours as needed for wheezing or shortness of breath.     Marland Kitchen albuterol (PROVENTIL) (2.5 MG/3ML) 0.083% nebulizer solution Take 3 mLs (2.5 mg total) by nebulization every 4 (four) hours as needed for wheezing or shortness of breath. 75 mL 0  . budesonide-formoterol (SYMBICORT) 160-4.5 MCG/ACT inhaler  Inhale 2 puffs into the lungs 2 (two) times daily.    Marland Kitchen ezetimibe (ZETIA) 10 MG tablet Take 10 mg by mouth daily.  3  . glimepiride (AMARYL) 2 MG tablet Take 2 mg by mouth daily. At 5 pm    . levothyroxine (SYNTHROID, LEVOTHROID) 150 MCG tablet Take 150 mcg by mouth daily before breakfast.    . metaxalone (SKELAXIN) 800 MG tablet Take 1-2 tablets (800-1,600 mg total) by mouth 2 (two) times daily as needed for muscle spasms. 1 tablet 0  . metoprolol succinate (TOPROL-XL) 50 MG 24 hr tablet TAKE 1 TABLET BY MOUTH EVERY DAY 30 tablet 8  . nitroGLYCERIN (NITROSTAT) 0.4 MG SL tablet Place 0.4 mg under the tongue every 5 (five) minutes as needed for chest pain.    . pantoprazole (PROTONIX) 40 MG tablet TAKE 1 TABLET (40 MG TOTAL) BY MOUTH 2 (TWO)  TIMES DAILY. (Patient taking differently: Take 40 mg by mouth 2 (two) times daily. ) 60 tablet 5  . polyethylene glycol (MIRALAX / GLYCOLAX) packet Take 17 g by mouth 2 (two) times daily. 14 each 0  . rosuvastatin (CRESTOR) 40 MG tablet Take 1 tablet (40 mg total) by mouth daily. 90 tablet 3  . senna-docusate (SENOKOT-S) 8.6-50 MG tablet Take 1 tablet by mouth 2 (two) times daily. 10 tablet 0  . Spacer/Aero-Holding Chambers (AEROCHAMBER MV) inhaler Use as instructed 1 each 0   No current facility-administered medications on file prior to visit.     Allergies  Allergen Reactions  . Clopidogrel Itching    Assessment/Plan:  1. Hyperlipidemia - Patient's LDL is slightly above goal of <70 on Crestor 40mg  and Zetia 10mg  daily. Since he has been having leg cramping, will reduce Crestor from 40 mg to 20 mg once daily. Also recommended trying Co-Q10 200 mg once daily. When I suggested a PCSK inhibitor and the fact that is an injection, patient and his daughter quickly said that he does not like needles. Advised patient that the needle is very small and that it would only be a twice a month injection. Patient was still hesistant to try PCSK9 inhibitor, so will not start that at this time. Also informed patient that a new cholesterol medication, bempedoic acid, would be coming out soon. Will call patient when bempedoic acid becomes available to see if he is willing to start that. Will be able to consolidate pill regimen and switch Zetia to bempedoic acid-Zetia combination pill.  Jackson Latino, PharmD PGY1 Pharmacy Resident Phone [phone] 11/30/2018     4:00 PM

## 2018-11-30 NOTE — Patient Instructions (Addendum)
Start taking rosuvastatin 20 mg once daily (you can cut your 40 mg pills in half). You can also try Coenzyme-Q10 200 mg once daily, which you can get over the counter.  I will call you when the new cholesterol medication is approved - it is called bempedoic acid and will be available in combination with your Zetia (ezetimibe) pill that you already take.

## 2018-12-03 DIAGNOSIS — M5416 Radiculopathy, lumbar region: Secondary | ICD-10-CM | POA: Diagnosis not present

## 2018-12-03 DIAGNOSIS — M62838 Other muscle spasm: Secondary | ICD-10-CM | POA: Diagnosis not present

## 2018-12-22 DIAGNOSIS — I5032 Chronic diastolic (congestive) heart failure: Secondary | ICD-10-CM | POA: Diagnosis not present

## 2019-01-11 ENCOUNTER — Other Ambulatory Visit: Payer: Self-pay

## 2019-01-11 DIAGNOSIS — Z23 Encounter for immunization: Secondary | ICD-10-CM | POA: Diagnosis not present

## 2019-01-11 DIAGNOSIS — W458XXA Other foreign body or object entering through skin, initial encounter: Secondary | ICD-10-CM | POA: Insufficient documentation

## 2019-01-11 DIAGNOSIS — Z87891 Personal history of nicotine dependence: Secondary | ICD-10-CM | POA: Insufficient documentation

## 2019-01-11 DIAGNOSIS — E119 Type 2 diabetes mellitus without complications: Secondary | ICD-10-CM | POA: Insufficient documentation

## 2019-01-11 DIAGNOSIS — I5032 Chronic diastolic (congestive) heart failure: Secondary | ICD-10-CM | POA: Diagnosis not present

## 2019-01-11 DIAGNOSIS — E039 Hypothyroidism, unspecified: Secondary | ICD-10-CM | POA: Diagnosis not present

## 2019-01-11 DIAGNOSIS — Y999 Unspecified external cause status: Secondary | ICD-10-CM | POA: Diagnosis not present

## 2019-01-11 DIAGNOSIS — S60551A Superficial foreign body of right hand, initial encounter: Secondary | ICD-10-CM | POA: Diagnosis not present

## 2019-01-11 DIAGNOSIS — I110 Hypertensive heart disease with heart failure: Secondary | ICD-10-CM | POA: Diagnosis not present

## 2019-01-11 DIAGNOSIS — Y929 Unspecified place or not applicable: Secondary | ICD-10-CM | POA: Diagnosis not present

## 2019-01-11 DIAGNOSIS — Z79899 Other long term (current) drug therapy: Secondary | ICD-10-CM | POA: Diagnosis not present

## 2019-01-11 DIAGNOSIS — Y9389 Activity, other specified: Secondary | ICD-10-CM | POA: Insufficient documentation

## 2019-01-11 DIAGNOSIS — I2510 Atherosclerotic heart disease of native coronary artery without angina pectoris: Secondary | ICD-10-CM | POA: Diagnosis not present

## 2019-01-11 DIAGNOSIS — S60454A Superficial foreign body of right ring finger, initial encounter: Secondary | ICD-10-CM | POA: Insufficient documentation

## 2019-01-12 ENCOUNTER — Encounter (HOSPITAL_COMMUNITY): Payer: Self-pay

## 2019-01-12 ENCOUNTER — Emergency Department (HOSPITAL_COMMUNITY)
Admission: EM | Admit: 2019-01-12 | Discharge: 2019-01-12 | Disposition: A | Discharge status: Home / Self Care | Payer: Medicare HMO | Attending: Emergency Medicine | Admitting: Emergency Medicine

## 2019-01-12 DIAGNOSIS — S6991XA Unspecified injury of right wrist, hand and finger(s), initial encounter: Secondary | ICD-10-CM

## 2019-01-12 MED ORDER — TETANUS-DIPHTH-ACELL PERTUSSIS 5-2.5-18.5 LF-MCG/0.5 IM SUSP
0.5000 mL | Freq: Once | INTRAMUSCULAR | Status: AC
  Administered 2019-01-12: 0.5 mL via INTRAMUSCULAR

## 2019-01-12 MED ORDER — TETANUS-DIPHTHERIA TOXOIDS TD 5-2 LFU IM INJ
0.5000 mL | INJECTION | Freq: Once | INTRAMUSCULAR | Status: DC
  Filled 2019-01-12: qty 0.5

## 2019-01-12 MED ORDER — CEPHALEXIN 500 MG PO CAPS
500.0000 mg | ORAL_CAPSULE | Freq: Once | ORAL | Status: AC
  Administered 2019-01-12: 500 mg via ORAL
  Filled 2019-01-12: qty 1

## 2019-01-12 MED ORDER — TETANUS-DIPHTH-ACELL PERTUSSIS 5-2.5-18.5 LF-MCG/0.5 IM SUSP
INTRAMUSCULAR | Status: AC
  Filled 2019-01-12: qty 0.5

## 2019-01-12 MED ORDER — CEPHALEXIN 500 MG PO CAPS: 500.0000 mg | ORAL_CAPSULE | Freq: Four times a day (QID) | ORAL | 0 refills | Status: DC

## 2019-01-12 MED ORDER — LIDOCAINE HCL 2 % IJ SOLN
10.0000 mL | Freq: Once | INTRAMUSCULAR | Status: AC
  Administered 2019-01-12: 200 mg via INTRADERMAL
  Filled 2019-01-12: qty 20

## 2019-01-12 NOTE — ED Notes (Signed)
Bed: [facility] Expected date:  Expected time:  Means of arrival:  Comments: 

## 2019-01-12 NOTE — ED Provider Notes (Signed)
[facility] DEPT Provider Note: Georgena Spurling, MD, FACEP  CSN: [id number] MRN: [id number] ARRIVAL: 01/11/19 at 2357 ROOM: [place]  fish hook in finger   HISTORY OF PRESENT ILLNESS  01/12/19 12:50 AM [name] is a 85 y.o. male who was rearranging his tackle box and got 1 prong of a treble hook embedded in the pad of his right ring finger.  There is moderate associated pain, worse with movement of the hook.  He is not sure of his tetanus status.   Past Medical History:  Diagnosis Date  . Anxiety   . Asthma   . Chronic diastolic CHF (congestive heart failure) (HCC)    diastolic   . COPD (chronic obstructive pulmonary disease) ([place])   . Coronary artery disease    s/p PCI of RCA 03/2009 at which time there was 70% ramus and 90% RCA, cath 01/2011 showed patent stents in RCA with moderate prox disesae, aneurysmal left circ and small 90% ramus s/p PCI, patent LAD EF 55%  . Diabetes mellitus   . Diverticulitis Oct. 2013   bleeding in the past and Effient for his CAD was stopped  . DVT (deep venous thrombosis) ([place])   . GERD (gastroesophageal reflux disease)   . Hypercholesterolemia   . Hypertension   . Hypothyroidism   . Obesity (BMI 30-39.9)   . OSA (obstructive sleep apnea)    severe on CPAP  . Peripheral vascular disease ([place]) 11/2006   s/p left stent  . Shortness of breath   . Sleep apnea    severe OSA awaiting CPAP titration    Past Surgical History:  Procedure Laterality Date  . ABDOMINAL AORTAGRAM N/A 04/20/2012   Procedure: ABDOMINAL Maxcine Ham;  Surgeon: Angelia Mould, MD;  Location: [facility] CATH LAB;  Service: Cardiovascular;  Laterality: N/A;  . ABDOMINAL AORTAGRAM N/A 12/29/2012   Procedure: ABDOMINAL Maxcine Ham;  Surgeon: Serafina Mitchell, MD;  Location: [facility] CATH LAB;  Service: Cardiovascular;  Laterality: N/A;  . BALLOON DILATION  12/26/2011   Procedure: BALLOON DILATION;  Surgeon: Garlan Fair, MD;  Location: [facility] ENDOSCOPY;  Service:  Endoscopy;  Laterality: N/A;  . COLONOSCOPY  08/14/2012   Procedure: COLONOSCOPY;  Surgeon: Arta Silence, MD;  Location: [facility] ENDOSCOPY;  Service: Endoscopy;  Laterality: N/A;  . CORONARY STENT PLACEMENT  2010 and 2012  . ESOPHAGOGASTRODUODENOSCOPY  12/26/2011   Procedure: ESOPHAGOGASTRODUODENOSCOPY (EGD);  Surgeon: Garlan Fair, MD;  Location: [facility] ENDOSCOPY;  Service: Endoscopy;  Laterality: N/A;  . ESOPHAGOGASTRODUODENOSCOPY  08/13/2012   Procedure: ESOPHAGOGASTRODUODENOSCOPY (EGD);  Surgeon: Arta Silence, MD;  Location: [facility] ENDOSCOPY;  Service: Endoscopy;  Laterality: Left;  . HERNIA REPAIR  1992   evntral hernia repair  . LOWER EXTREMITY ANGIOGRAM Bilateral 12/29/2012   Procedure: LOWER EXTREMITY ANGIOGRAM;  Surgeon: Serafina Mitchell, MD;  Location: [facility] CATH LAB;  Service: Cardiovascular;  Laterality: Bilateral;  bilat lower extrem angio    Family History  Problem Relation Age of Onset  . Diabetes Mother   . Hyperlipidemia Mother   . Hypertension Mother   . Heart attack Mother   . Heart disease Mother        before age 5  . Diabetes Brother   . Heart disease Father        before age 43  . Breast cancer Sister   . Diabetes Sister   . Hyperlipidemia Sister   . Hypertension Sister   . Heart attack Sister   . Hyperlipidemia Sister   .  Hypertension Sister   . Heart disease Sister        Heart Disease before age 30  . Asthma Sister   . Malignant hyperthermia Neg Hx     Social History   Tobacco Use  . Smoking status: Former Smoker    Packs/day: 0.50    Years: 15.00    Pack years: 7.50    Types: Cigarettes, Cigars    Last attempt to quit: 10/15/1963    Years since quitting: 55.2  . Smokeless tobacco: Never Used  Substance Use Topics  . Alcohol use: No  . Drug use: No    Prior to Admission medications   Medication Sig Start Date End Date Taking? Authorizing Provider  albuterol (PROVENTIL HFA;VENTOLIN HFA) 108 (90 BASE) MCG/ACT inhaler Inhale 2 puffs into the lungs  every 6 (six) hours as needed for wheezing or shortness of breath.     [provider]  albuterol (PROVENTIL) (2.5 MG/3ML) 0.083% nebulizer solution Take 3 mLs (2.5 mg total) by nebulization every 4 (four) hours as needed for wheezing or shortness of breath. 09/25/16   Patrecia Pour, MD  budesonide-formoterol (SYMBICORT) 160-4.5 MCG/ACT inhaler Inhale 2 puffs into the lungs 2 (two) times daily.    [provider]  ezetimibe (ZETIA) 10 MG tablet Take 10 mg by mouth daily. 03/31/18   [provider]  glimepiride (AMARYL) 2 MG tablet Take 2 mg by mouth daily. At 5 pm    [provider]  levothyroxine (SYNTHROID, LEVOTHROID) 150 MCG tablet Take 150 mcg by mouth daily before breakfast. 05/19/17   [provider]  metaxalone (SKELAXIN) 800 MG tablet Take 1-2 tablets (800-1,600 mg total) by mouth 2 (two) times daily as needed for muscle spasms. 09/06/18   Regalado, Belkys A, MD  metoprolol succinate (TOPROL-XL) 50 MG 24 hr tablet TAKE 1 TABLET BY MOUTH EVERY DAY 05/29/18   Sueanne Margarita, MD  nitroGLYCERIN (NITROSTAT) 0.4 MG SL tablet Place 0.4 mg under the tongue every 5 (five) minutes as needed for chest pain.    [provider]  pantoprazole (PROTONIX) 40 MG tablet TAKE 1 TABLET (40 MG TOTAL) BY MOUTH 2 (TWO) TIMES DAILY. Patient taking differently: Take 40 mg by mouth 2 (two) times daily.  07/29/17   Sueanne Margarita, MD  polyethylene glycol (MIRALAX / GLYCOLAX) packet Take 17 g by mouth 2 (two) times daily. 09/06/18   Regalado, Belkys A, MD  rosuvastatin (CRESTOR) 20 MG tablet Take 1 tablet (20 mg total) by mouth daily. 11/30/18   Turner, Eber Hong, MD  senna-docusate (SENOKOT-S) 8.6-50 MG tablet Take 1 tablet by mouth 2 (two) times daily. 09/06/18   Regalado, Cassie Freer, MD  Spacer/Aero-Holding Chambers (AEROCHAMBER MV) inhaler Use as instructed 05/21/18   Martyn Ehrich, NP    Allergies Clopidogrel   REVIEW OF SYSTEMS  Negative except as noted here  or in the History of Present Illness.   PHYSICAL EXAMINATION  Initial Vital Signs Blood pressure (!) 195/123, pulse 87, temperature 98.4 F (36.9 C), temperature source Oral, resp. rate 18, height 5\' 4"  (1.626 m), weight 90.7 kg, SpO2 90 %.  Examination General: Well-developed, well-nourished male in no acute distress; appearance consistent with age of record HENT: normocephalic; atraumatic Eyes: Normal appearance Neck: supple Heart: regular rate and rhythm Lungs: clear to auscultation bilaterally Abdomen: soft; nondistended Extremities: No deformity; full range of motion Neurologic: Awake, alert and oriented; motor function intact in all extremities and symmetric; no facial droop Skin: Warm and  dry; prone of treble hook embedded in pad of right ring finger just adjacent to the nail Psychiatric: Normal mood and affect   RESULTS  Summary of this visit's results, reviewed by myself:   EKG Interpretation  Date/Time:    Ventricular Rate:    PR Interval:    QRS Duration:   QT Interval:    QTC Calculation:   R Axis:     Text Interpretation:        Laboratory Studies: No results found for this or any previous visit (from the past 24 hour(s)). Imaging Studies: No results found.  ED COURSE and MDM  Nursing notes and initial vitals signs, including pulse oximetry, reviewed.  Vitals:   01/12/19 0003  BP: (!) 195/123  Pulse: 87  Resp: 18  Temp: 98.4 F (36.9 C)  TempSrc: Oral  SpO2: 90%  Weight: 90.7 kg  Height: 5\' 4"  (1.626 m)    PROCEDURES   FISH HOOK REMOVAL The skin surrounding the fishhook was anesthetized with approximately 2 mL of 2% lidocaine without epinephrine.  The embedded prong was then separated from the trouble hook using wire cutters.  The embedded hook was then advanced through the skin to expose the barb.  The barb was then grasped with forceps and the hook was removed from the finger.  The patient tolerated this well and there were no immediate  complications.  The finger was then copiously irrigated and bandaged.  ED DIAGNOSES     ICD-10-CM   1. Fish hook injury of finger of right hand, initial encounter S69.91XA        Braidyn Peace, Jenny Reichmann, MD 01/12/19 [id number]

## 2019-01-12 NOTE — ED Triage Notes (Signed)
Pt was cleaning his tackle box out and got a fish hook stuck in his hand

## 2019-01-22 DIAGNOSIS — I5032 Chronic diastolic (congestive) heart failure: Secondary | ICD-10-CM | POA: Diagnosis not present

## 2019-02-05 ENCOUNTER — Other Ambulatory Visit: Payer: Self-pay | Admitting: Cardiology

## 2019-02-24 ENCOUNTER — Telehealth: Payer: Self-pay | Admitting: Pharmacist

## 2019-02-24 NOTE — Telephone Encounter (Signed)
Called pt to follow up with lipids. He reports tolerating dose decrease of rosuvastatin 20mg  better, still taking Zetia as well. Discussed trying Nexletol however pt declines. He previously declined PCSK9i therapy as well. Discussed scheduling f/u lipid panel to check LDL on lower dose of rosuvastatin but pt declined. Encouraged him to call in the future to schedule lab work.

## 2019-03-12 DIAGNOSIS — E039 Hypothyroidism, unspecified: Secondary | ICD-10-CM | POA: Diagnosis not present

## 2019-03-12 DIAGNOSIS — E782 Mixed hyperlipidemia: Secondary | ICD-10-CM | POA: Diagnosis not present

## 2019-03-12 DIAGNOSIS — J449 Chronic obstructive pulmonary disease, unspecified: Secondary | ICD-10-CM | POA: Diagnosis not present

## 2019-03-12 DIAGNOSIS — I2510 Atherosclerotic heart disease of native coronary artery without angina pectoris: Secondary | ICD-10-CM | POA: Diagnosis not present

## 2019-03-12 DIAGNOSIS — D649 Anemia, unspecified: Secondary | ICD-10-CM | POA: Diagnosis not present

## 2019-03-12 DIAGNOSIS — J9691 Respiratory failure, unspecified with hypoxia: Secondary | ICD-10-CM | POA: Diagnosis not present

## 2019-03-12 DIAGNOSIS — I110 Hypertensive heart disease with heart failure: Secondary | ICD-10-CM | POA: Diagnosis not present

## 2019-03-12 DIAGNOSIS — E1159 Type 2 diabetes mellitus with other circulatory complications: Secondary | ICD-10-CM | POA: Diagnosis not present

## 2019-03-12 DIAGNOSIS — I739 Peripheral vascular disease, unspecified: Secondary | ICD-10-CM | POA: Diagnosis not present

## 2019-03-15 DIAGNOSIS — J449 Chronic obstructive pulmonary disease, unspecified: Secondary | ICD-10-CM | POA: Diagnosis not present

## 2019-03-15 DIAGNOSIS — G4733 Obstructive sleep apnea (adult) (pediatric): Secondary | ICD-10-CM | POA: Diagnosis not present

## 2019-04-01 ENCOUNTER — Emergency Department (HOSPITAL_COMMUNITY): Payer: Medicare HMO

## 2019-04-01 ENCOUNTER — Observation Stay (HOSPITAL_COMMUNITY)
Admission: EM | Admit: 2019-04-01 | Discharge: 2019-04-01 | Discharge status: Against Medical Advice | Payer: Medicare HMO | Attending: Internal Medicine | Admitting: Internal Medicine

## 2019-04-01 ENCOUNTER — Other Ambulatory Visit: Payer: Self-pay

## 2019-04-01 DIAGNOSIS — G4733 Obstructive sleep apnea (adult) (pediatric): Secondary | ICD-10-CM | POA: Insufficient documentation

## 2019-04-01 DIAGNOSIS — J449 Chronic obstructive pulmonary disease, unspecified: Secondary | ICD-10-CM | POA: Diagnosis present

## 2019-04-01 DIAGNOSIS — Z833 Family history of diabetes mellitus: Secondary | ICD-10-CM | POA: Insufficient documentation

## 2019-04-01 DIAGNOSIS — A419 Sepsis, unspecified organism: Secondary | ICD-10-CM

## 2019-04-01 DIAGNOSIS — I4510 Unspecified right bundle-branch block: Secondary | ICD-10-CM | POA: Diagnosis not present

## 2019-04-01 DIAGNOSIS — E861 Hypovolemia: Secondary | ICD-10-CM | POA: Diagnosis not present

## 2019-04-01 DIAGNOSIS — R1084 Generalized abdominal pain: Secondary | ICD-10-CM | POA: Insufficient documentation

## 2019-04-01 DIAGNOSIS — F419 Anxiety disorder, unspecified: Secondary | ICD-10-CM | POA: Insufficient documentation

## 2019-04-01 DIAGNOSIS — Z79899 Other long term (current) drug therapy: Secondary | ICD-10-CM | POA: Insufficient documentation

## 2019-04-01 DIAGNOSIS — Z955 Presence of coronary angioplasty implant and graft: Secondary | ICD-10-CM | POA: Diagnosis not present

## 2019-04-01 DIAGNOSIS — Z791 Long term (current) use of non-steroidal anti-inflammatories (NSAID): Secondary | ICD-10-CM | POA: Insufficient documentation

## 2019-04-01 DIAGNOSIS — E785 Hyperlipidemia, unspecified: Secondary | ICD-10-CM | POA: Diagnosis present

## 2019-04-01 DIAGNOSIS — E8770 Fluid overload, unspecified: Secondary | ICD-10-CM

## 2019-04-01 DIAGNOSIS — Z1159 Encounter for screening for other viral diseases: Secondary | ICD-10-CM | POA: Insufficient documentation

## 2019-04-01 DIAGNOSIS — E669 Obesity, unspecified: Secondary | ICD-10-CM | POA: Diagnosis present

## 2019-04-01 DIAGNOSIS — I110 Hypertensive heart disease with heart failure: Secondary | ICD-10-CM | POA: Insufficient documentation

## 2019-04-01 DIAGNOSIS — Z6830 Body mass index (BMI) 30.0-30.9, adult: Secondary | ICD-10-CM | POA: Insufficient documentation

## 2019-04-01 DIAGNOSIS — Z8349 Family history of other endocrine, nutritional and metabolic diseases: Secondary | ICD-10-CM | POA: Insufficient documentation

## 2019-04-01 DIAGNOSIS — Z79890 Hormone replacement therapy (postmenopausal): Secondary | ICD-10-CM | POA: Diagnosis not present

## 2019-04-01 DIAGNOSIS — R0602 Shortness of breath: Secondary | ICD-10-CM | POA: Diagnosis not present

## 2019-04-01 DIAGNOSIS — K5730 Diverticulosis of large intestine without perforation or abscess without bleeding: Secondary | ICD-10-CM | POA: Diagnosis not present

## 2019-04-01 DIAGNOSIS — E1151 Type 2 diabetes mellitus with diabetic peripheral angiopathy without gangrene: Secondary | ICD-10-CM | POA: Insufficient documentation

## 2019-04-01 DIAGNOSIS — K219 Gastro-esophageal reflux disease without esophagitis: Secondary | ICD-10-CM | POA: Diagnosis not present

## 2019-04-01 DIAGNOSIS — I10 Essential (primary) hypertension: Secondary | ICD-10-CM | POA: Diagnosis present

## 2019-04-01 DIAGNOSIS — E7800 Pure hypercholesterolemia, unspecified: Secondary | ICD-10-CM | POA: Insufficient documentation

## 2019-04-01 DIAGNOSIS — I2510 Atherosclerotic heart disease of native coronary artery without angina pectoris: Secondary | ICD-10-CM | POA: Diagnosis present

## 2019-04-01 DIAGNOSIS — R509 Fever, unspecified: Secondary | ICD-10-CM | POA: Diagnosis not present

## 2019-04-01 DIAGNOSIS — Z7984 Long term (current) use of oral hypoglycemic drugs: Secondary | ICD-10-CM | POA: Diagnosis not present

## 2019-04-01 DIAGNOSIS — I5032 Chronic diastolic (congestive) heart failure: Secondary | ICD-10-CM | POA: Diagnosis present

## 2019-04-01 DIAGNOSIS — Z7951 Long term (current) use of inhaled steroids: Secondary | ICD-10-CM | POA: Diagnosis not present

## 2019-04-01 DIAGNOSIS — Z8249 Family history of ischemic heart disease and other diseases of the circulatory system: Secondary | ICD-10-CM | POA: Diagnosis not present

## 2019-04-01 DIAGNOSIS — Z87891 Personal history of nicotine dependence: Secondary | ICD-10-CM | POA: Insufficient documentation

## 2019-04-01 DIAGNOSIS — R600 Localized edema: Secondary | ICD-10-CM | POA: Diagnosis present

## 2019-04-01 DIAGNOSIS — R0600 Dyspnea, unspecified: Secondary | ICD-10-CM | POA: Diagnosis present

## 2019-04-01 DIAGNOSIS — E039 Hypothyroidism, unspecified: Secondary | ICD-10-CM | POA: Insufficient documentation

## 2019-04-01 LAB — CBC WITH DIFFERENTIAL/PLATELET
Abs Immature Granulocytes: 0.01 10*3/uL (ref 0.00–0.07)
Basophils Absolute: 0.1 10*3/uL (ref 0.0–0.1)
Basophils Relative: 1 %
Eosinophils Absolute: 0 10*3/uL (ref 0.0–0.5)
Eosinophils Relative: 0 %
HCT: 48.5 % (ref 39.0–52.0)
Hemoglobin: 15.7 g/dL (ref 13.0–17.0)
Immature Granulocytes: 0 %
Lymphocytes Relative: 19 %
Lymphs Abs: 1 10*3/uL (ref 0.7–4.0)
MCH: 29.2 pg (ref 26.0–34.0)
MCHC: 32.4 g/dL (ref 30.0–36.0)
MCV: 90.3 fL (ref 80.0–100.0)
Monocytes Absolute: 0.6 10*3/uL (ref 0.1–1.0)
Monocytes Relative: 12 %
Neutro Abs: 3.6 10*3/uL (ref 1.7–7.7)
Neutrophils Relative %: 68 %
Platelets: 88 10*3/uL — ABNORMAL LOW (ref 150–400)
RBC: 5.37 MIL/uL (ref 4.22–5.81)
RDW: 14.7 % (ref 11.5–15.5)
WBC: 5.4 10*3/uL (ref 4.0–10.5)
nRBC: 0 % (ref 0.0–0.2)

## 2019-04-01 LAB — COMPREHENSIVE METABOLIC PANEL
ALT: 48 U/L — ABNORMAL HIGH (ref 0–44)
AST: 70 U/L — ABNORMAL HIGH (ref 15–41)
Albumin: 4.7 g/dL (ref 3.5–5.0)
Alkaline Phosphatase: 52 U/L (ref 38–126)
Anion gap: 10 (ref 5–15)
BUN: 15 mg/dL (ref 8–23)
CO2: 27 mmol/L (ref 22–32)
Calcium: 9.3 mg/dL (ref 8.9–10.3)
Chloride: 97 mmol/L — ABNORMAL LOW (ref 98–111)
Creatinine, Ser: 1.14 mg/dL (ref 0.61–1.24)
GFR calc Af Amer: >60 mL/min (ref >60)
GFR calc non Af Amer: 58 mL/min — ABNORMAL LOW (ref >60)
Glucose, Bld: 111 mg/dL — ABNORMAL HIGH (ref 70–99)
Potassium: 4 mmol/L (ref 3.5–5.1)
Sodium: 134 mmol/L — ABNORMAL LOW (ref 135–145)
Total Bilirubin: 1.1 mg/dL (ref 0.3–1.2)
Total Protein: 8 g/dL (ref 6.5–8.1)

## 2019-04-01 LAB — PROTIME-INR
INR: 1 (ref 0.8–1.2)
Prothrombin Time: 13.1 seconds (ref 11.4–15.2)

## 2019-04-01 LAB — URINALYSIS, ROUTINE W REFLEX MICROSCOPIC
Bilirubin Urine: NEGATIVE
Glucose, UA: NEGATIVE mg/dL
Ketones, ur: NEGATIVE mg/dL
Leukocytes,Ua: NEGATIVE
Nitrite: NEGATIVE
Protein, ur: 30 mg/dL — AB
Specific Gravity, Urine: 1.016 (ref 1.005–1.030)
pH: 5 (ref 5.0–8.0)

## 2019-04-01 LAB — BRAIN NATRIURETIC PEPTIDE: B Natriuretic Peptide: 26.4 pg/mL (ref 0.0–100.0)

## 2019-04-01 LAB — SARS CORONAVIRUS 2 BY RT PCR (HOSPITAL ORDER, PERFORMED IN ~~LOC~~ HOSPITAL LAB): SARS Coronavirus 2: NEGATIVE

## 2019-04-01 LAB — TROPONIN I: Troponin I: 0.03 ng/mL (ref <0.03)

## 2019-04-01 LAB — LIPASE, BLOOD: Lipase: 28 U/L (ref 11–51)

## 2019-04-01 LAB — LACTIC ACID, PLASMA
Lactic Acid, Venous: 0.9 mmol/L (ref 0.5–1.9)
Lactic Acid, Venous: 1 mmol/L (ref 0.5–1.9)

## 2019-04-01 MED ORDER — ACETAMINOPHEN 325 MG PO TABS
650.0000 mg | ORAL_TABLET | Freq: Once | ORAL | Status: AC
  Administered 2019-04-01: 650 mg via ORAL
  Filled 2019-04-01: qty 2

## 2019-04-01 MED ORDER — ALBUTEROL SULFATE HFA 108 (90 BASE) MCG/ACT IN AERS: 2.0000 | INHALATION_SPRAY | Freq: Four times a day (QID) | RESPIRATORY_TRACT | Status: DC | PRN

## 2019-04-01 MED ORDER — ALBUTEROL SULFATE (2.5 MG/3ML) 0.083% IN NEBU: 2.5000 mg | INHALATION_SOLUTION | RESPIRATORY_TRACT | Status: DC | PRN

## 2019-04-01 MED ORDER — PANTOPRAZOLE SODIUM 40 MG PO TBEC: 40.0000 mg | DELAYED_RELEASE_TABLET | Freq: Two times a day (BID) | ORAL | Status: DC

## 2019-04-01 MED ORDER — VANCOMYCIN HCL IN DEXTROSE 1-5 GM/200ML-% IV SOLN: 1000.0000 mg | Freq: Once | INTRAVENOUS | Status: DC

## 2019-04-01 MED ORDER — SODIUM CHLORIDE 0.9 % IV SOLN
2.0000 g | Freq: Once | INTRAVENOUS | Status: DC
  Filled 2019-04-01: qty 2

## 2019-04-01 MED ORDER — EZETIMIBE 10 MG PO TABS: 10.0000 mg | ORAL_TABLET | Freq: Every day | ORAL | Status: DC

## 2019-04-01 MED ORDER — NITROGLYCERIN 0.4 MG SL SUBL: 0.4000 mg | SUBLINGUAL_TABLET | SUBLINGUAL | Status: DC | PRN

## 2019-04-01 MED ORDER — METOPROLOL SUCCINATE ER 50 MG PO TB24: 50.0000 mg | ORAL_TABLET | Freq: Every day | ORAL | Status: DC

## 2019-04-01 MED ORDER — METRONIDAZOLE IN NACL 5-0.79 MG/ML-% IV SOLN
500.0000 mg | Freq: Once | INTRAVENOUS | Status: DC
  Filled 2019-04-01: qty 100

## 2019-04-01 MED ORDER — ROSUVASTATIN CALCIUM 20 MG PO TABS: 20.0000 mg | ORAL_TABLET | Freq: Every day | ORAL | Status: DC

## 2019-04-01 MED ORDER — LEVOTHYROXINE SODIUM 150 MCG PO TABS: 150.0000 ug | ORAL_TABLET | Freq: Every evening | ORAL | Status: DC

## 2019-04-01 MED ORDER — MOMETASONE FURO-FORMOTEROL FUM 200-5 MCG/ACT IN AERO: 2.0000 | INHALATION_SPRAY | Freq: Two times a day (BID) | RESPIRATORY_TRACT | Status: DC

## 2019-04-01 MED ORDER — VANCOMYCIN HCL 10 G IV SOLR
1750.0000 mg | Freq: Once | INTRAVENOUS | Status: DC
  Filled 2019-04-01: qty 1750

## 2019-04-01 MED ORDER — ENOXAPARIN SODIUM 40 MG/0.4ML ~~LOC~~ SOLN: 40.0000 mg | SUBCUTANEOUS | Status: DC

## 2019-04-01 NOTE — ED Triage Notes (Signed)
BIB POV; c/o increasing SHOB with exertion and activity. +edema to BLE, fever. Patient states that his symptoms have been present for "awhile"

## 2019-04-01 NOTE — ED Notes (Signed)
Bed: WA18 Expected date:  Expected time:  Means of arrival:  Comments: Neg pressure 

## 2019-04-01 NOTE — ED Notes (Signed)
Pt bladder scan was 25ml. Nurses aware.

## 2019-04-01 NOTE — ED Notes (Signed)
Patient stated they don't want to be admitted to the hospital. This writer made MD [facility] aware. MD Mcmanus stated he can leave AMA. Patient advised not to leave by the writer and MD. Patient ambulated out of ED w/ steady gate.  Patient signed [facility] paperwork.

## 2019-04-01 NOTE — ED Notes (Signed)
ED Provider at bedside. 

## 2019-04-01 NOTE — ED Notes (Signed)
Pt. Ambulated from bed to the hallway. Pt. O2  Was at 97% while walking in the hallway and back to the room. Pt. HR was at 95. Pt. Had no complaints while walking. Denies SOB and dizziness.

## 2019-04-01 NOTE — ED Notes (Signed)
Pt. Weight was 201.0. Nurse aware.

## 2019-04-01 NOTE — ED Notes (Signed)
Patient's daughter Sondra Barges [phone] keep family updated with regards to father's treatment plan

## 2019-04-01 NOTE — Progress Notes (Signed)
A consult was received from an ED physician for Vancomycin and Cefepime per pharmacy dosing.  The patient's profile has been reviewed for ht/wt/allergies/indication/available labs.    A one time order has been placed for Vancomycin 1750mg  and Cefepime 2gm IV x 1 dose each .  Further antibiotics/pharmacy consults should be ordered by admitting physician if indicated.                       Thank you, Everette Rank, PharmD 04/01/2019  1:57 PM

## 2019-04-01 NOTE — ED Provider Notes (Signed)
[facility] DEPT Provider Note   CSN: [id number] Arrival date & time: 04/01/19  0944     History   Chief Complaint Chief Complaint  Patient presents with   Fever    HPI [name] is a 85 y.o. male.     HPI  Pt was seen at 1005. Per pt, c/o gradual onset and worsening of persistent SOB for "a while now." Has been associated with increasing pedal edema. Pt states the SOB worsens with laying flat and on exertion. Pt states he also has been "urinating a lot" for the past several weeks. Pt states he has had generalized abd "pains" for the past 3 days. Pt states he was evaluated by his PMD PTA, sent to the ED for further evaluation. Pt and family deny home fevers. Denies cough, no CP/palpitations, no N/V/D, no back pain, no focal motor weakness, no injury, no rash.     Past Medical History:  Diagnosis Date   Anxiety    Asthma    Chronic diastolic CHF (congestive heart failure) (HCC)    diastolic    COPD (chronic obstructive pulmonary disease) ([place])    Coronary artery disease    s/p PCI of RCA 03/2009 at which time there was 70% ramus and 90% RCA, cath 01/2011 showed patent stents in RCA with moderate prox disesae, aneurysmal left circ and small 90% ramus s/p PCI, patent LAD EF 55%   Diabetes mellitus    Diverticulitis Oct. 2013   bleeding in the past and Effient for his CAD was stopped   DVT (deep venous thrombosis) (HCC)    GERD (gastroesophageal reflux disease)    Hypercholesterolemia    Hypertension    Hypothyroidism    Obesity (BMI 30-39.9)    OSA (obstructive sleep apnea)    severe on CPAP   Peripheral vascular disease ([place]) 11/2006   s/p left stent   Shortness of breath    Sleep apnea    severe OSA awaiting CPAP titration    Patient Active Problem List   Diagnosis Date Noted   Acute lower GI bleeding 09/03/2018   Diabetes mellitus type II, non insulin dependent ([place]) 09/03/2018   Hyperkalemia 09/03/2018    Hypertensive urgency    COPD (chronic obstructive pulmonary disease) ([place]) 11/14/2017   Influenza A 11/13/2017   Mild renal insufficiency    LLL pneumonia ([place]) 05/22/2017   History of DVT of lower extremity 05/22/2017   COPD exacerbation ([place]) 09/22/2016   SIRS (systemic inflammatory response syndrome) ([place]) 09/22/2016   Dyspnea 11/16/2014   Hypoxia 11/16/2014   Aftercare following surgery of the circulatory system 08/03/2014   Chronic diastolic CHF (congestive heart failure) ([place]) 06/21/2014   Edema extremities 06/21/2014   COPD GOLD III 12/27/2013   Coronary artery disease    OSA (obstructive sleep apnea)    Obesity (BMI 30-39.9)    Weakness of left leg 11/20/2012   Left thigh pain 11/20/2012   Atherosclerosis of native arteries of the extremities with intermittent claudication 04/01/2012   WEIGHT GAIN, ABNORMAL 12/07/2009   Dyslipidemia 11/21/2009   HYPERTENSION, BENIGN 11/21/2009   DEEP VENOUS THROMBOPHLEBITIS, LEFT, LEG, HX OF 11/21/2009    Past Surgical History:  Procedure Laterality Date   ABDOMINAL AORTAGRAM N/A 04/20/2012   Procedure: ABDOMINAL Maxcine Ham;  Surgeon: Angelia Mould, MD;  Location: [facility] CATH LAB;  Service: Cardiovascular;  Laterality: N/A;   ABDOMINAL AORTAGRAM N/A 12/29/2012   Procedure: ABDOMINAL Maxcine Ham;  Surgeon: Serafina Mitchell, MD;  Location: [facility]  CATH LAB;  Service: Cardiovascular;  Laterality: N/A;   BALLOON DILATION  12/26/2011   Procedure: BALLOON DILATION;  Surgeon: Garlan Fair, MD;  Location: [facility] ENDOSCOPY;  Service: Endoscopy;  Laterality: N/A;   COLONOSCOPY  08/14/2012   Procedure: COLONOSCOPY;  Surgeon: Arta Silence, MD;  Location: [facility] ENDOSCOPY;  Service: Endoscopy;  Laterality: N/A;   CORONARY STENT PLACEMENT  2010 and 2012   ESOPHAGOGASTRODUODENOSCOPY  12/26/2011   Procedure: ESOPHAGOGASTRODUODENOSCOPY (EGD);  Surgeon: Garlan Fair, MD;  Location: [facility] ENDOSCOPY;  Service: Endoscopy;  Laterality: N/A;     ESOPHAGOGASTRODUODENOSCOPY  08/13/2012   Procedure: ESOPHAGOGASTRODUODENOSCOPY (EGD);  Surgeon: Arta Silence, MD;  Location: [facility] ENDOSCOPY;  Service: Endoscopy;  Laterality: Left;   HERNIA REPAIR  1992   evntral hernia repair   LOWER EXTREMITY ANGIOGRAM Bilateral 12/29/2012   Procedure: LOWER EXTREMITY ANGIOGRAM;  Surgeon: Serafina Mitchell, MD;  Location: [facility] CATH LAB;  Service: Cardiovascular;  Laterality: Bilateral;  bilat lower extrem angio        Home Medications    Prior to Admission medications   Medication Sig Start Date End Date Taking? Authorizing Provider  albuterol (PROVENTIL HFA;VENTOLIN HFA) 108 (90 BASE) MCG/ACT inhaler Inhale 2 puffs into the lungs every 6 (six) hours as needed for wheezing or shortness of breath.    Yes [provider]  albuterol (PROVENTIL) (2.5 MG/3ML) 0.083% nebulizer solution Take 3 mLs (2.5 mg total) by nebulization every 4 (four) hours as needed for wheezing or shortness of breath. 09/25/16  Yes Patrecia Pour, MD  budesonide-formoterol [facility]) 160-4.5 MCG/ACT inhaler Inhale 2 puffs into the lungs 2 (two) times daily.   Yes [provider]  ezetimibe (ZETIA) 10 MG tablet Take 1 tablet (10 mg total) by mouth daily. Please make yearly appt with Dr. Radford Pax for June before anymore refills. 1st attempt Patient taking differently: Take 10 mg by mouth every evening.  02/05/19  Yes Turner, Traci R, MD  glimepiride (AMARYL) 2 MG tablet Take 2 mg by mouth every evening.    Yes [provider]  levothyroxine (SYNTHROID, LEVOTHROID) 150 MCG tablet Take 150 mcg by mouth every evening.  05/19/17  Yes [provider]  meloxicam (MOBIC) 15 MG tablet Take 15 mg by mouth daily. 03/30/19  Yes [provider]  metaxalone (SKELAXIN) 800 MG tablet Take 1-2 tablets (800-1,600 mg total) by mouth 2 (two) times daily as needed for muscle spasms. 09/06/18  Yes Regalado, Belkys A, MD  metoprolol succinate (TOPROL-XL) 50 MG 24 hr  tablet TAKE 1 TABLET BY MOUTH EVERY DAY Patient taking differently: Take 50 mg by mouth every evening.  05/29/18  Yes Turner, Eber Hong, MD  nitroGLYCERIN (NITROSTAT) 0.4 MG SL tablet Place 0.4 mg under the tongue every 5 (five) minutes as needed for chest pain.   Yes [provider]  pantoprazole (PROTONIX) 40 MG tablet TAKE 1 TABLET (40 MG TOTAL) BY MOUTH 2 (TWO) TIMES DAILY. Patient taking differently: Take 40 mg by mouth 2 (two) times daily.  07/29/17  Yes Turner, Eber Hong, MD  rosuvastatin (CRESTOR) 20 MG tablet Take 1 tablet (20 mg total) by mouth daily. Patient taking differently: Take 20 mg by mouth every evening.  11/30/18  Yes Sueanne Margarita, MD    Family History Family History  Problem Relation Age of Onset   Diabetes Mother    Hyperlipidemia Mother    Hypertension Mother    Heart attack Mother    Heart disease Mother  before age 41   Diabetes Brother    Heart disease Father        before age 54   Breast cancer Sister    Diabetes Sister    Hyperlipidemia Sister    Hypertension Sister    Heart attack Sister    Hyperlipidemia Sister    Hypertension Sister    Heart disease Sister        Heart Disease before age 25   Asthma Sister    Malignant hyperthermia Neg Hx     Social History Social History   Tobacco Use   Smoking status: Former Smoker    Packs/day: 0.50    Years: 15.00    Pack years: 7.50    Types: Cigarettes, Cigars    Quit date: 10/15/1963    Years since quitting: 55.4   Smokeless tobacco: Never Used  Substance Use Topics   Alcohol use: No   Drug use: No     Allergies   Clopidogrel   Review of Systems Review of Systems ROS: Statement: All systems negative except as marked or noted in the HPI; Constitutional: Negative for fever and chills. ; ; Eyes: Negative for eye pain, redness and discharge. ; ; ENMT: Negative for ear pain, hoarseness, nasal congestion, sinus pressure and sore throat. ; ; Cardiovascular:  Negative for chest pain, palpitations, diaphoresis, +dyspnea and peripheral edema. ; ; Respiratory: Negative for cough, wheezing and stridor. ; ; Gastrointestinal: +abd pain. Negative for nausea, vomiting, diarrhea, blood in stool, hematemesis, jaundice and rectal bleeding. . ; ; Genitourinary: +"urinating a lot." Negative for dysuria, flank pain and hematuria. ; ; Musculoskeletal: Negative for back pain and neck pain. Negative for swelling and trauma.; ; Skin: Negative for pruritus, rash, abrasions, blisters, bruising and skin lesion.; ; Neuro: Negative for headache, lightheadedness and neck stiffness. Negative for weakness, altered level of consciousness, altered mental status, extremity weakness, paresthesias, involuntary movement, seizure and syncope.       Physical Exam Updated Vital Signs BP (!) 154/76 (BP Location: Left Arm)    Pulse 95    Temp (!) 102.2 F (39 C) (Oral)    Resp 17    Ht 5\' 5"  (1.651 m)    Wt 89.8 kg    SpO2 91%    BMI 32.95 kg/m     Patient Vitals for the past 24 hrs:  BP Temp Temp src Pulse Resp SpO2 Height Weight  04/01/19 1302 131/71 -- -- 81 19 96 % -- --  04/01/19 1100 (!) 162/100 -- -- 98 (!) 25 93 % -- --  04/01/19 1030 (!) 167/88 -- -- 93 15 97 % -- --  04/01/19 0957 (!) 154/76 (!) 102.2 F (39 C) Oral 95 17 91 % 5\' 5"  (1.651 m) 89.8 kg     Physical Exam 1010: Physical examination:  Nursing notes reviewed; Vital signs and O2 SAT reviewed;  Constitutional: Well developed, Well nourished, Well hydrated, In no acute distress; Head:  Normocephalic, atraumatic; Eyes: EOMI, PERRL, No scleral icterus; ENMT: Mouth and pharynx normal, Mucous membranes moist; Neck: Supple, Full range of motion, No lymphadenopathy. No meningeal signs.; Cardiovascular: Regular rate and rhythm, No gallop; Respiratory: Breath sounds diminished & equal bilaterally, No wheezes. Mild tachypnea. Speaking full sentences with ease, Normal respiratory effort/excursion; Chest: Nontender, Movement  normal; Abdomen: Soft, +mild diffuse tenderness to palp. No rebound or guarding. Nondistended, Normal bowel sounds; Genitourinary: No CVA tenderness; Extremities: Peripheral pulses normal, No tenderness, +2 pedal edema bilat. No calf tenderness or asymmetry.; Neuro:  AA&Ox3, tangential historian. No facial droop. Major CN grossly intact.  Speech clear. No gross focal motor or sensory deficits in extremities.; Skin: Color normal, Warm, Dry.    ED Treatments / Results  Labs (all labs ordered are listed, but only abnormal results are displayed)   EKG EKG Interpretation  Date/Time:  Thursday April 01 2019 12:47:31 EDT Ventricular Rate:  84 PR Interval:    QRS Duration: 93 QT Interval:  359 QTC Calculation: 425 R Axis:   -51 Text Interpretation:  Sinus rhythm Left anterior fascicular block When compared with ECG of 09/03/2018 Right bundle branch block is no longer Present Confirmed by Francine Graven [phone]) on 04/01/2019 1:10:13 PM   Radiology   Procedures Procedures (including critical care time)  Medications Ordered in ED Medications  acetaminophen (TYLENOL) tablet 650 mg (has no administration in time range)     Initial Impression / Assessment and Plan / ED Course  I have reviewed the triage vital signs and the nursing notes.  Pertinent labs & imaging results that were available during my care of the patient were reviewed by me and considered in my medical decision making (see chart for details).     MDM Reviewed: previous chart, nursing note and vitals Reviewed previous: labs and ECG Interpretation: labs, ECG and x-ray   Results for orders placed or performed during the hospital encounter of 04/01/19  SARS Coronavirus 2 (CEPHEID - Performed in [place] hospital lab), [facility] Order   Specimen: Nasopharyngeal Swab  Result Value Ref Range   SARS Coronavirus 2 NEGATIVE NEGATIVE  Culture, blood (routine x 2)   Specimen: BLOOD  Result Value Ref Range   Specimen  Description      BLOOD RIGHT ANTECUBITAL Performed at [facility], [address], [place], [place] [address]    Special Requests      BOTTLES DRAWN AEROBIC AND ANAEROBIC Blood Culture adequate volume Performed at [facility] [address], [place],  [address]    Culture PENDING    Report Status PENDING   Comprehensive metabolic panel  Result Value Ref Range   Sodium 134 (L) 135 - 145 mmol/L   Potassium 4.0 3.5 - 5.1 mmol/L   Chloride 97 (L) 98 - 111 mmol/L   CO2 27 22 - 32 mmol/L   Glucose, Bld 111 (H) 70 - 99 mg/dL   BUN 15 8 - 23 mg/dL   Creatinine, Ser 1.14 0.61 - 1.24 mg/dL   Calcium 9.3 8.9 - 10.3 mg/dL   Total Protein 8.0 6.5 - 8.1 g/dL   Albumin 4.7 3.5 - 5.0 g/dL   AST 70 (H) 15 - 41 U/L   ALT 48 (H) 0 - 44 U/L   Alkaline Phosphatase 52 38 - 126 U/L   Total Bilirubin 1.1 0.3 - 1.2 mg/dL   GFR calc non Af Amer 58 (L) >60 mL/min   GFR calc Af Amer >60 >60 mL/min   Anion gap 10 5 - 15  Brain natriuretic peptide  Result Value Ref Range   B Natriuretic Peptide 26.4 0.0 - 100.0 pg/mL  Troponin I - Once  Result Value Ref Range   Troponin I 0.03 (HH) <0.03 ng/mL  Lactic acid, plasma  Result Value Ref Range   Lactic Acid, Venous 0.9 0.5 - 1.9 mmol/L  Lactic acid, plasma  Result Value Ref Range   Lactic Acid, Venous 1.0 0.5 - 1.9 mmol/L  CBC with Differential  Result Value Ref Range   WBC 5.4  4.0 - 10.5 K/uL   RBC 5.37 4.22 - 5.81 MIL/uL   Hemoglobin 15.7 13.0 - 17.0 g/dL   HCT 48.5 39.0 - 52.0 %   MCV 90.3 80.0 - 100.0 fL   MCH 29.2 26.0 - 34.0 pg   MCHC 32.4 30.0 - 36.0 g/dL   RDW 14.7 11.5 - 15.5 %   Platelets 88 (L) 150 - 400 K/uL   nRBC 0.0 0.0 - 0.2 %   Neutrophils Relative % 68 %   Neutro Abs 3.6 1.7 - 7.7 K/uL   Lymphocytes Relative 19 %   Lymphs Abs 1.0 0.7 - 4.0 K/uL   Monocytes Relative 12 %   Monocytes Absolute 0.6 0.1 - 1.0 K/uL   Eosinophils Relative 0 %   Eosinophils Absolute 0.0 0.0 - 0.5 K/uL   Basophils  Relative 1 %   Basophils Absolute 0.1 0.0 - 0.1 K/uL   Immature Granulocytes 0 %   Abs Immature Granulocytes 0.01 0.00 - 0.07 K/uL  Protime-INR  Result Value Ref Range   Prothrombin Time 13.1 11.4 - 15.2 seconds   INR 1.0 0.8 - 1.2  Urinalysis, Routine w reflex microscopic  Result Value Ref Range   Color, Urine YELLOW YELLOW   APPearance CLEAR CLEAR   Specific Gravity, Urine 1.016 1.005 - 1.030   pH 5.0 5.0 - 8.0   Glucose, UA NEGATIVE NEGATIVE mg/dL   Hgb urine dipstick SMALL (A) NEGATIVE   Bilirubin Urine NEGATIVE NEGATIVE   Ketones, ur NEGATIVE NEGATIVE mg/dL   Protein, ur 30 (A) NEGATIVE mg/dL   Nitrite NEGATIVE NEGATIVE   Leukocytes,Ua NEGATIVE NEGATIVE   RBC / HPF 0-5 0 - 5 RBC/hpf   WBC, UA 0-5 0 - 5 WBC/hpf   Bacteria, UA RARE (A) NONE SEEN   Squamous Epithelial / LPF 0-5 0 - 5   Mucus PRESENT    Hyaline Casts, UA PRESENT   Lipase, blood  Result Value Ref Range   Lipase 28 11 - 51 U/L   Ct Abdomen Pelvis Wo Contrast Result Date: 04/01/2019 CLINICAL DATA:  Abdominal pain, shortness of breath, lower extremity edema, fever EXAM: CT ABDOMEN AND PELVIS WITHOUT CONTRAST TECHNIQUE: Multidetector CT imaging of the abdomen and pelvis was performed following the standard protocol without IV contrast. COMPARISON:  None. FINDINGS: Lower chest: No acute abnormality. Hepatobiliary: No solid liver abnormality is seen. No gallstones, gallbladder wall thickening, or biliary dilatation. Pancreas: Unremarkable. No pancreatic ductal dilatation or surrounding inflammatory changes. Spleen: Normal in size without significant abnormality. Adrenals/Urinary Tract: Adrenal glands are unremarkable. Kidneys are normal, without renal calculi, solid lesion, or hydronephrosis. Bladder is unremarkable. Stomach/Bowel: Stomach is within normal limits. Appendix appears normal. No evidence of bowel wall thickening, distention, or inflammatory changes. Pancolonic diverticulosis. Vascular/Lymphatic: Severe calcific  atherosclerosis, with an aneurysm of the infrarenal abdominal aorta measuring up to 4.2 x 3.5 cm. There are very short bilateral common iliac arteries and a stent of origin of the left external iliac artery. No enlarged abdominal or pelvic lymph nodes. Reproductive: No mass or other significant abnormality. Other: No abdominal wall hernia or abnormality. No abdominopelvic ascites. Musculoskeletal: No acute or significant osseous findings. Severe arthrosis of the left hip joint. IMPRESSION: 1. No acute noncontrast CT findings of the abdomen or pelvis to explain abdominal pain, shortness of breath, lower extremity edema, or fever. 2. Severe calcific atherosclerosis, with an aneurysm of the infrarenal abdominal aorta measuring up to 4.2 x 3.5 cm. There are very short bilateral common iliac arteries and a  stent of origin of the left external iliac artery. 3. Pancolonic diverticulosis without evidence of acute diverticulitis. Electronically Signed   By: Eddie Candle M.D.   On: 04/01/2019 13:16   Dg Chest Port 1 View Result Date: 04/01/2019 CLINICAL DATA:  Increasing shortness of breath with exertion. Lower extremity edema. Fever. EXAM: PORTABLE CHEST 1 VIEW COMPARISON:  07/14/2018 FINDINGS: Cardiac silhouette is normal in size. No mediastinal or hilar masses or evidence adenopathy. Minor lung base scarring, stable. Lungs otherwise clear. No pleural effusion or pneumothorax. Skeletal structures are grossly intact. IMPRESSION: No active disease. Electronically Signed   By: Lajean Manes M.D.   On: 04/01/2019 11:13    SIDI DZIKOWSKI was evaluated in Emergency Department on 04/01/2019 for the symptoms described in the history of present illness. He was evaluated in the context of the global COVID-19 pandemic, which necessitated consideration that the patient might be at risk for infection with the SARS-CoV-2 virus that causes COVID-19. Institutional protocols and algorithms that pertain to the evaluation of patients at  risk for COVID-19 are in a state of rapid change based on information released by regulatory bodies including the CDC and federal and state organizations. These policies and algorithms were followed during the patient's care in the ED.    1400:  APAP given for fever. Mild LFT elevation with generalized abd pains; CT reassuring. Family initially very upset that "things were taking so long" but have since calmed since I spoke with her. Pt's weight increased today (89.9kg) from last weight on file (83.5kg) 11/06/18 and pt appears fluid overloaded. CXR without overt CHF. T/C returned from [facility] Dr. Tawanna Solo, case discussed, including:  HPI, pertinent PM/SHx, VS/PE, dx testing, ED course and treatment:  Agreeable to come to ED for evaluation for admit.   1425:  Pt now states he does not want to be admitted. Pt is walking around the ED with steady gait, easy resps, Sats 97% R/A. Pt informed re: dx testing results, including unknown cause for fever and possible fluid overload and that I recommend observation admission for further evaluation.  Pt refuses admission.  I encouraged pt to stay, continues to refuse.  Pt makes his own medical decisions.  Risks of AMA explained to pt including, but not limited to:  stroke, heart attack, cardiac arrythmia ("irregular heart rate/beat"), "passing out," temporary and/or permanent disability, death.  Pt verb understanding and continues to refuse admission, understanding the consequences of his decision.  I encouraged pt to follow up with his PMD tomorrow and return to the ED immediately if symptoms return, or for any other concerns.  Pt verb understanding, agreeable.     Final Clinical Impressions(s) / ED Diagnoses   Final diagnoses:  None    ED Discharge Orders    None       Francine Graven, DO 04/05/19 0840

## 2019-04-02 LAB — URINE CULTURE: Culture: NO GROWTH

## 2019-04-06 LAB — CULTURE, BLOOD (ROUTINE X 2)
Culture: NO GROWTH
Culture: NO GROWTH
Special Requests: ADEQUATE

## 2019-04-09 DIAGNOSIS — G4733 Obstructive sleep apnea (adult) (pediatric): Secondary | ICD-10-CM | POA: Diagnosis not present

## 2019-04-14 DIAGNOSIS — J449 Chronic obstructive pulmonary disease, unspecified: Secondary | ICD-10-CM | POA: Diagnosis not present

## 2019-04-14 DIAGNOSIS — G4733 Obstructive sleep apnea (adult) (pediatric): Secondary | ICD-10-CM | POA: Diagnosis not present

## 2019-05-04 ENCOUNTER — Other Ambulatory Visit: Payer: Self-pay

## 2019-05-05 MED ORDER — EZETIMIBE 10 MG PO TABS: 10.0000 mg | ORAL_TABLET | Freq: Every day | ORAL | 0 refills | Status: DC

## 2019-05-09 DIAGNOSIS — G4733 Obstructive sleep apnea (adult) (pediatric): Secondary | ICD-10-CM | POA: Diagnosis not present

## 2019-05-15 DIAGNOSIS — G4733 Obstructive sleep apnea (adult) (pediatric): Secondary | ICD-10-CM | POA: Diagnosis not present

## 2019-05-15 DIAGNOSIS — J449 Chronic obstructive pulmonary disease, unspecified: Secondary | ICD-10-CM | POA: Diagnosis not present

## 2019-05-19 ENCOUNTER — Other Ambulatory Visit: Payer: Self-pay | Admitting: Cardiology

## 2019-05-19 MED ORDER — EZETIMIBE 10 MG PO TABS: 10.0000 mg | ORAL_TABLET | Freq: Every day | ORAL | 0 refills | Status: DC

## 2019-05-19 NOTE — Telephone Encounter (Signed)
Pt's medication was sent to pt's pharmacy as requested. Confirmation received.  °

## 2019-06-01 ENCOUNTER — Other Ambulatory Visit: Payer: Self-pay

## 2019-06-03 ENCOUNTER — Other Ambulatory Visit: Payer: Self-pay | Admitting: Cardiology

## 2019-06-03 MED ORDER — ROSUVASTATIN CALCIUM 20 MG PO TABS: 20.0000 mg | ORAL_TABLET | Freq: Every day | ORAL | 0 refills | Status: DC

## 2019-06-09 DIAGNOSIS — G4733 Obstructive sleep apnea (adult) (pediatric): Secondary | ICD-10-CM | POA: Diagnosis not present

## 2019-06-15 DIAGNOSIS — G4733 Obstructive sleep apnea (adult) (pediatric): Secondary | ICD-10-CM | POA: Diagnosis not present

## 2019-06-15 DIAGNOSIS — J449 Chronic obstructive pulmonary disease, unspecified: Secondary | ICD-10-CM | POA: Diagnosis not present

## 2019-07-05 NOTE — Progress Notes (Signed)
Virtual Visit via telephone Note   This visit type was conducted due to national recommendations for restrictions regarding the COVID-19 Pandemic (e.g. social distancing) in an effort to limit this patient's exposure and mitigate transmission in our community.  Due to his co-morbid illnesses, this patient is at least at moderate risk for complications without adequate follow up.  This format is felt to be most appropriate for this patient at this time.  All issues noted in this document were discussed and addressed.  A limited physical exam was performed with this format.  Please refer to the patient's chart for his consent to telehealth for [facility].  Evaluation Performed:  Follow-up visit  This visit type was conducted due to national recommendations for restrictions regarding the COVID-19 Pandemic (e.g. social distancing).  This format is felt to be most appropriate for this patient at this time.  All issues noted in this document were discussed and addressed.  No physical exam was performed (except for noted visual exam findings with Video Visits).  Please refer to the patient's chart (MyChart message for video visits and phone note for telephone visits) for the patient's consent to telehealth for [facility]  Date:  07/06/2019   ID:  [name], DOB 1930-08-20, MRN [id number]  Patient Location:  Home  Provider location:   [place]  PCP:  [name], MD  Cardiologist:  Fransico Him, MD  Electrophysiologist:  None   Chief Complaint:  CHF, OSA, CAD, DM  History of Present Illness:    [name] is a 85 y.o. male who presents via audio/video conferencing for a telehealth visit today.    [name] is a 85 y.o. male with a hx of ASCAD (s/p PCI of RCA 6/2010with residual70% ramus and 90% RCA, cath 01/2011 showed patent stents in RCA with moderate prox disesae, aneurysmal left circ and small 90% ramus s/p PCI),  He also has a hx of HTN, dyslipidemia, chronic  diastolic CHF, obesity and OSA on CPAP.    He is here today for followup and is doing well.  He denies any chest pain or pressure,  PND, orthopnea, palpitations or syncope. He has chronic DOE and LE edema which are stable.  He did have a dizzy spell in August that resulted in him falling. He was going fishing and was waking along and blacked out for a second and fell and then got back up and started walking.  He had no sx prior to the event and has not had any further episodes.   He did not hurt himself.  He is compliant with his meds and is tolerating meds with no SE.    He is doing well with his CPAP device and thinks that he has gotten used to it.  He tolerates the mask and feels the pressure is adequate.  Since going on CPAP he feels rested in the am and has no significant daytime sleepiness.  He denies any significant mouth or nasal dryness or nasal congestion.  He does not think that he snores.    The patient does not have symptoms concerning for COVID-19 infection (fever, chills, cough, or new shortness of breath).    Prior CV studies:   The following studies were reviewed today:  PAP compliance download  Past Medical History:  Diagnosis Date  . Anxiety   . Asthma   . Chronic diastolic CHF (congestive heart failure) (HCC)    diastolic   . COPD (chronic obstructive pulmonary disease) (  [place])   . Coronary artery disease    s/p PCI of RCA 03/2009 at which time there was 70% ramus and 90% RCA, cath 01/2011 showed patent stents in RCA with moderate prox disesae, aneurysmal left circ and small 90% ramus s/p PCI, patent LAD EF 55%  . Diabetes mellitus   . Diverticulitis Oct. 2013   bleeding in the past and Effient for his CAD was stopped  . DVT (deep venous thrombosis) ([place])   . GERD (gastroesophageal reflux disease)   . Hypercholesterolemia   . Hypertension   . Hypothyroidism   . Obesity (BMI 30-39.9)   . OSA (obstructive sleep apnea)    severe on CPAP  . Peripheral vascular disease  ([place]) 11/2006   s/p left stent  . Shortness of breath   . Sleep apnea    severe OSA awaiting CPAP titration   Past Surgical History:  Procedure Laterality Date  . ABDOMINAL AORTAGRAM N/A 04/20/2012   Procedure: ABDOMINAL Maxcine Ham;  Surgeon: Angelia Mould, MD;  Location: [facility] CATH LAB;  Service: Cardiovascular;  Laterality: N/A;  . ABDOMINAL AORTAGRAM N/A 12/29/2012   Procedure: ABDOMINAL Maxcine Ham;  Surgeon: Serafina Mitchell, MD;  Location: [facility] CATH LAB;  Service: Cardiovascular;  Laterality: N/A;  . BALLOON DILATION  12/26/2011   Procedure: BALLOON DILATION;  Surgeon: Garlan Fair, MD;  Location: [facility] ENDOSCOPY;  Service: Endoscopy;  Laterality: N/A;  . COLONOSCOPY  08/14/2012   Procedure: COLONOSCOPY;  Surgeon: Arta Silence, MD;  Location: [facility] ENDOSCOPY;  Service: Endoscopy;  Laterality: N/A;  . CORONARY STENT PLACEMENT  2010 and 2012  . ESOPHAGOGASTRODUODENOSCOPY  12/26/2011   Procedure: ESOPHAGOGASTRODUODENOSCOPY (EGD);  Surgeon: Garlan Fair, MD;  Location: [facility] ENDOSCOPY;  Service: Endoscopy;  Laterality: N/A;  . ESOPHAGOGASTRODUODENOSCOPY  08/13/2012   Procedure: ESOPHAGOGASTRODUODENOSCOPY (EGD);  Surgeon: Arta Silence, MD;  Location: [facility] ENDOSCOPY;  Service: Endoscopy;  Laterality: Left;  . HERNIA REPAIR  1992   evntral hernia repair  . LOWER EXTREMITY ANGIOGRAM Bilateral 12/29/2012   Procedure: LOWER EXTREMITY ANGIOGRAM;  Surgeon: Serafina Mitchell, MD;  Location[facility] C[facility] LAB;  Service: Cardiovascular;  Laterality: Bilateral;  bilat lower extrem angio     Current Meds  Medication Sig  . albuterol (PROVENTIL HFA;VENTOLIN HFA) 108 (90 BASE) MCG/ACT inhaler Inhale 2 puffs into the lungs every 6 (six) hours as needed for wheezing or shortness of breath.   Marland Kitchen albuterol (PROVENTIL) (2.5 MG/3ML) 0.083% nebulizer solution Take 3 mLs (2.5 mg total) by nebulization every 4 (four) hours as needed for wheezing or shortness of breath.  Marland Kitchen aspirin EC 81 MG tablet Take 81 mg by mouth daily.   . budesonide-formoterol (SYMBICORT) 160-4.5 MCG/ACT inhaler Inhale 2 puffs into the lungs 2 (two) times daily.  Marland Kitchen ezetimibe (ZETIA) 10 MG tablet Take 1 tablet (10 mg total) by mouth daily. Please keep upcoming appt in September before anymore refills. Thank you  . furosemide (LASIX) 40 MG tablet Take 40 mg by mouth.  Marland Kitchen glimepiride (AMARYL) 2 MG tablet Take 2 mg by mouth every evening.   Marland Kitchen levothyroxine (SYNTHROID, LEVOTHROID) 150 MCG tablet Take 150 mcg by mouth every evening.   . meloxicam (MOBIC) 15 MG tablet Take 15 mg by mouth daily.  . metoprolol succinate (TOPROL-XL) 50 MG 24 hr tablet Take 1 tablet (50 mg total) by mouth daily. Pt must keep upcoming appt in sept to get further refills  . nitroGLYCERIN (NITROSTAT) 0.4 MG SL tablet Place 0.4 mg under the tongue every 5 (five) minutes  as needed for chest pain.  . rosuvastatin (CRESTOR) 20 MG tablet Take 1 tablet (20 mg total) by mouth daily. Please keep upcoming appt in September with Dr. Radford Pax for future refills. Thank you     Allergies:   Clopidogrel   Social History   Tobacco Use  . Smoking status: Former Smoker    Packs/day: 0.50    Years: 15.00    Pack years: 7.50    Types: Cigarettes, Cigars    Quit date: 10/15/1963    Years since quitting: 55.7  . Smokeless tobacco: Never Used  Substance Use Topics  . Alcohol use: No  . Drug use: No     Family Hx: The patient's family history includes Asthma in his sister; Breast cancer in his sister; Diabetes in his brother, mother, and sister; Heart attack in his mother and sister; Heart disease in his father, mother, and sister; Hyperlipidemia in his mother, sister, and sister; Hypertension in his mother, sister, and sister. There is no history of Malignant hyperthermia.  ROS:   Please see the history of present illness.     All other systems reviewed and are negative.   Labs/Other Tests and Data Reviewed:    Recent Labs: 04/01/2019: ALT 48; B Natriuretic Peptide 26.4; BUN 15;  Creatinine, Ser 1.14; Hemoglobin 15.7; Platelets 88; Potassium 4.0; Sodium 134   Recent Lipid Panel Lab Results  Component Value Date/Time   CHOL 101 10/23/2017 08:38 AM   TRIG 58 10/23/2017 08:38 AM   HDL 42 10/23/2017 08:38 AM   CHOLHDL 2.4 10/23/2017 08:38 AM   CHOLHDL 4 11/16/2014 11:24 AM   LDLCALC 47 10/23/2017 08:38 AM    Wt Readings from Last 3 Encounters:  07/06/19 185 lb (83.9 kg)  04/01/19 198 lb (89.8 kg)  01/12/19 200 lb (90.7 kg)     Objective:    Vital Signs:  BP (!) 170/90   Ht 5\' 5"  (1.651 m)   Wt 185 lb (83.9 kg)   BMI 30.79 kg/m     ASSESSMENT & PLAN:    1.  ASCAD - s/p PCI of RCA 6/2010with residual70% ramus and 90% RCA.  Cath 01/2011 showed patent stents in RCA with moderate prox disesae, aneurysmal left circ and small 90% ramus s/p PCI.  He has not had any angina sx since I saw him last.  He will continue on BB, statin, long acting nitrate and ASA.  2.  HTN -BP is poorly controlled this am.  He will conitnue on Toprol XL 50mg  daily. I have asked him to check his BP daily for a week and call with the results.  3.  Chronic diastolic CHF - His LE edema and SOB are stable.   He will continue on  Lasix 40mg  daily.    4. OSA -  The patient is tolerating PAP therapy well without any problems. The PAP download was reviewed today and showed an AHI of 1/hr on 9 cm H2O with 10% compliance in using more than 4 hours nightly.  The patient has been using and benefiting from PAP use and will continue to benefit from therapy. He does not sleep much at night.  He goes to bed at 3am and gets up at noon.  I encouraged him to wear his device nightly.    5.  Hyperlipidemia with LDL goal less than 70.   He will continue on Zetia 10 mg daily and Crestor 40 mg daily.  I will check an FLP and ALT.  6.  Syncope - unclear if he really lost consciousness.  There were no sx ahead of time and after so unclear if he really passed out.  I have asked him to call if he has any  further syncope.  COVID-19 Education: The signs and symptoms of COVID-19 were discussed with the patient and how to seek care for testing (follow up with PCP or arrange E-visit).  The importance of social distancing was discussed today.  Patient Risk:   After full review of this patient's clinical status, I feel that they are at least moderate risk at this time.  Time:   Today, I have spent 20 minutes directly with the patient on telemedicine discussing medical problems including CHF, CAD, HLD, HTN.  We also reviewed the symptoms of COVID 19 and the ways to protect against contracting the virus with telehealth technology.  I spent an additional 5 minutes reviewing patient's chart including PAP download.  Medication Adjustments/Labs and Tests Ordered: Current medicines are reviewed at length with the patient today.  Concerns regarding medicines are outlined above.  Tests Ordered: No orders of the defined types were placed in this encounter.  Medication Changes: No orders of the defined types were placed in this encounter.   Disposition:  Follow up in 6 month(s) virtual  Signed, Fransico Him, MD  07/06/2019 8:30 AM    [facility]

## 2019-07-06 ENCOUNTER — Telehealth (INDEPENDENT_AMBULATORY_CARE_PROVIDER_SITE_OTHER): Payer: Medicare HMO | Admitting: Cardiology

## 2019-07-06 ENCOUNTER — Encounter: Payer: Self-pay | Admitting: Cardiology

## 2019-07-06 ENCOUNTER — Other Ambulatory Visit: Payer: Self-pay

## 2019-07-06 VITALS — BP 170/90 | Ht 65.0 in | Wt 185.0 lb

## 2019-07-06 DIAGNOSIS — I5032 Chronic diastolic (congestive) heart failure: Secondary | ICD-10-CM

## 2019-07-06 DIAGNOSIS — R55 Syncope and collapse: Secondary | ICD-10-CM

## 2019-07-06 DIAGNOSIS — E785 Hyperlipidemia, unspecified: Secondary | ICD-10-CM

## 2019-07-06 DIAGNOSIS — G4733 Obstructive sleep apnea (adult) (pediatric): Secondary | ICD-10-CM | POA: Diagnosis not present

## 2019-07-06 DIAGNOSIS — I2510 Atherosclerotic heart disease of native coronary artery without angina pectoris: Secondary | ICD-10-CM

## 2019-07-06 DIAGNOSIS — I10 Essential (primary) hypertension: Secondary | ICD-10-CM | POA: Diagnosis not present

## 2019-07-06 NOTE — Addendum Note (Signed)
Addended by: Polly Cobia A on: 07/06/2019 09:46 AM   Modules accepted: Orders

## 2019-07-06 NOTE — Patient Instructions (Signed)
Medication Instructions:  Your physician recommends that you continue on your current medications as directed. Please refer to the Current Medication list given to you today.  If you need a refill on your cardiac medications before your next appointment, please call your pharmacy.   Lab work: Your physician recommends that you return for lab work in: 1 week Fasting Lipid Profile ALT  If you have labs (blood work) drawn today and your tests are completely normal, you will receive your results only by: Marland Kitchen MyChart Message (if you have MyChart) OR . A paper copy in the mail If you have any lab test that is abnormal or we need to change your treatment, we will call you to review the results.  Testing/Procedures: EKG  Follow-Up: At [facility], you and your health needs are our priority.  As part of our continuing mission to provide you with exceptional heart care, we have created designated Provider Care Teams.  These Care Teams include your primary Cardiologist (physician) and Advanced Practice Providers (APPs -  Physician Assistants and Nurse Practitioners) who all work together to provide you with the care you need, when you need it. You will need a follow up appointment in 6 months.  Please call our office 2 months in advance to schedule this appointment.  You may see Fransico Him, MD or one of the following Advanced Practice Providers on your designated Care Team:   [place], PA-C Melina Copa, PA-C . Ermalinda Barrios, PA-C  Any Other Special Instructions Will Be Listed Below (If Applicable).

## 2019-07-06 NOTE — Addendum Note (Signed)
Addended by: Polly Cobia A on: 07/06/2019 09:27 AM   Modules accepted: Orders

## 2019-07-08 DIAGNOSIS — G4733 Obstructive sleep apnea (adult) (pediatric): Secondary | ICD-10-CM | POA: Diagnosis not present

## 2019-07-10 DIAGNOSIS — G4733 Obstructive sleep apnea (adult) (pediatric): Secondary | ICD-10-CM | POA: Diagnosis not present

## 2019-07-12 ENCOUNTER — Telehealth: Payer: Self-pay | Admitting: Cardiology

## 2019-07-12 NOTE — Telephone Encounter (Signed)
[name], patient's daughter called to give weeks worth of BP readings:  9/21  170/90  HR 66       60 9/22  170/84  HR 80  80 9/23 100/60  HR 84  80 9/24 120/70  HR 80  80 9/25 120/80  HR 68  80 9/26 130/76  HR 76   80 9/27 110/72  HR 108 96  The HR readings were 10 minutes apart.

## 2019-07-12 NOTE — Telephone Encounter (Signed)
What time of day were the first 2 days of BP readings - they are significantly higher than the others

## 2019-07-13 NOTE — Telephone Encounter (Addendum)
I spoke with [name]. She reports readings on 9/21 were taken at 5:15 PM and readings on 9/22 were taken at 3:00 PM.  In order from 9/23-9/27 the readings were taken at 3:00 PM, 3:25 PM, 2:30 PM, 6:30 PM and noon.  Pt has trouble sleeping and goes to sleep around 3-4 AM,  He gets up around noon -2:00 PM.  He takes Metoprolol between 4-5 PM daily.  I told [name] we would forward to Dr. Radford Pax for review and call her back if any changes need to be made

## 2019-07-13 NOTE — Telephone Encounter (Signed)
Have him take the metoprolol when he wakes up and then check BP 2 hours after that for a week and call with results

## 2019-07-13 NOTE — Telephone Encounter (Signed)
[name] aware of recommendations and agrees with plan ./cy

## 2019-07-14 ENCOUNTER — Ambulatory Visit (INDEPENDENT_AMBULATORY_CARE_PROVIDER_SITE_OTHER): Payer: Medicare HMO | Admitting: *Deleted

## 2019-07-14 ENCOUNTER — Other Ambulatory Visit: Payer: Medicare HMO | Admitting: *Deleted

## 2019-07-14 ENCOUNTER — Other Ambulatory Visit: Payer: Self-pay

## 2019-07-14 VITALS — BP 164/82 | HR 85 | Ht 65.0 in | Wt 201.0 lb

## 2019-07-14 DIAGNOSIS — R55 Syncope and collapse: Secondary | ICD-10-CM

## 2019-07-14 DIAGNOSIS — I10 Essential (primary) hypertension: Secondary | ICD-10-CM | POA: Diagnosis not present

## 2019-07-14 DIAGNOSIS — E785 Hyperlipidemia, unspecified: Secondary | ICD-10-CM

## 2019-07-14 DIAGNOSIS — I2510 Atherosclerotic heart disease of native coronary artery without angina pectoris: Secondary | ICD-10-CM

## 2019-07-14 LAB — LIPID PANEL
Chol/HDL Ratio: 2.6 ratio (ref 0.0–5.0)
Cholesterol, Total: 129 mg/dL (ref 100–199)
HDL: 49 mg/dL (ref >39)
LDL Chol Calc (NIH): 64 mg/dL (ref 0–99)
Triglycerides: 82 mg/dL (ref 0–149)
VLDL Cholesterol Cal: 16 mg/dL (ref 5–40)

## 2019-07-14 LAB — ALT: ALT: 17 IU/L (ref 0–44)

## 2019-07-14 NOTE — Patient Instructions (Signed)
Medication Instructions:   Your physician recommends that you continue on your current medications as directed. Please refer to the Current Medication list given to you today.  If you need a refill on your cardiac medications before your next appointment, please call your pharmacy.     Follow-Up:  As already planned with Dr. Radford Pax for 6 months as another virtual visit.

## 2019-07-14 NOTE — Progress Notes (Signed)
1.) Reason for visit: EKG per Dr Radford Pax  2.) Name of MD requesting visit: Dr. Radford Pax  3.) H&P: Pt here for nurse visit for EKG as ordered by Dr. Radford Pax.  Pt has a history of ASCAD, HTN, Chronic diastolic CHF, OSA, HLP, and Syncope.    4.) ROS related to problem: Pt here for EKG nurse visit.  Pt recently had a virtual visit with Dr. Radford Pax, who ordered for the pt to come back for this.  Pt had a syncopal episode, and noted in Dr. Theodosia Blender A&P, unclear if he really lost consciousness and if he really truly passed out, for there were no sx ahead of time and after.  EKG was performed and pt was noted to be in NSR with a HR-85.  EKG showed to DOD Dr. Caryl Comes who reviewed this and noted him to be in NSR. Per Dr. Caryl Comes, no changes to be made and pt should continue his current regimen, and forward this to Dr. Radford Pax for further follow-up.   5.) Assessment and plan per MD: per DOD Dr. Caryl Comes, EKG showed NSR, no changes to be made.  Pt to continue current regimen and follow-up as planned with Dr. Radford Pax in 6 months virtual visit.  EKG will be scanned into the chart today.

## 2019-07-15 DIAGNOSIS — G4733 Obstructive sleep apnea (adult) (pediatric): Secondary | ICD-10-CM | POA: Diagnosis not present

## 2019-07-15 DIAGNOSIS — J449 Chronic obstructive pulmonary disease, unspecified: Secondary | ICD-10-CM | POA: Diagnosis not present

## 2019-08-05 ENCOUNTER — Telehealth: Payer: Self-pay | Admitting: Cardiology

## 2019-08-05 NOTE — Telephone Encounter (Signed)
New Message  Patients daughter is calling because she is trying to set the setting on the patients CPAP machine. She spoke with [facility] and they stated that they would need the original order. Please call to discuss.

## 2019-08-08 ENCOUNTER — Telehealth: Payer: Self-pay | Admitting: *Deleted

## 2019-08-08 DIAGNOSIS — G4733 Obstructive sleep apnea (adult) (pediatric): Secondary | ICD-10-CM

## 2019-08-08 NOTE — Telephone Encounter (Signed)
-----   Message from Sueanne Margarita, MD sent at 08/07/2019  2:55 PM EDT ----- Regarding: RE: Need a prescription for DME Set CPAP at 9cm H2O and get a download in 2 weeks ----- Message ----- From: [name], CMA Sent: 08/06/2019   5:55 PM EDT To: Sueanne Margarita, MD Subject: Need a prescription for DME                    4. OSA -  The patient is tolerating PAP therapy well without any problems. The PAP download was reviewed today and showed an AHI of 1/hr on 9 cm H2O.  [name] is asking for a prescription to set the pressure on new cpap. Patient feels like he can not breathe. The old was on 9 cm H20 and worked well. Please write for the pressure from the old machine.  Thanks

## 2019-08-08 NOTE — Telephone Encounter (Addendum)
Order placed to [facility] where he gets supplies to Set CPAP at 9cm H2O and get a download in 2 weeks. His DME is [facility].

## 2019-08-09 DIAGNOSIS — G4733 Obstructive sleep apnea (adult) (pediatric): Secondary | ICD-10-CM | POA: Diagnosis not present

## 2019-08-09 NOTE — Telephone Encounter (Signed)
Daughter notified 

## 2019-08-09 NOTE — Telephone Encounter (Signed)
Reached out to the daughter and notified her that Dr Radford Pax wrote the prescription and it has been faxed to [facility] to Set CPAP at 9cm H2O and get a download in 2 weeks. Daughter is aware and agreeable to treatment.

## 2019-08-11 DIAGNOSIS — I130 Hypertensive heart and chronic kidney disease with heart failure and stage 1 through stage 4 chronic kidney disease, or unspecified chronic kidney disease: Secondary | ICD-10-CM | POA: Diagnosis not present

## 2019-08-11 DIAGNOSIS — I2510 Atherosclerotic heart disease of native coronary artery without angina pectoris: Secondary | ICD-10-CM | POA: Diagnosis not present

## 2019-08-11 DIAGNOSIS — Z23 Encounter for immunization: Secondary | ICD-10-CM | POA: Diagnosis not present

## 2019-08-11 DIAGNOSIS — I739 Peripheral vascular disease, unspecified: Secondary | ICD-10-CM | POA: Diagnosis not present

## 2019-08-11 DIAGNOSIS — E1122 Type 2 diabetes mellitus with diabetic chronic kidney disease: Secondary | ICD-10-CM | POA: Diagnosis not present

## 2019-08-11 DIAGNOSIS — N183 Chronic kidney disease, stage 3 unspecified: Secondary | ICD-10-CM | POA: Diagnosis not present

## 2019-08-11 DIAGNOSIS — E039 Hypothyroidism, unspecified: Secondary | ICD-10-CM | POA: Diagnosis not present

## 2019-08-11 DIAGNOSIS — D696 Thrombocytopenia, unspecified: Secondary | ICD-10-CM | POA: Diagnosis not present

## 2019-08-11 DIAGNOSIS — E1159 Type 2 diabetes mellitus with other circulatory complications: Secondary | ICD-10-CM | POA: Diagnosis not present

## 2019-08-11 DIAGNOSIS — I5030 Unspecified diastolic (congestive) heart failure: Secondary | ICD-10-CM | POA: Diagnosis not present

## 2019-08-11 DIAGNOSIS — J449 Chronic obstructive pulmonary disease, unspecified: Secondary | ICD-10-CM | POA: Diagnosis not present

## 2019-08-11 DIAGNOSIS — Z0000 Encounter for general adult medical examination without abnormal findings: Secondary | ICD-10-CM | POA: Diagnosis not present

## 2019-08-11 DIAGNOSIS — E1165 Type 2 diabetes mellitus with hyperglycemia: Secondary | ICD-10-CM | POA: Diagnosis not present

## 2019-08-11 NOTE — Addendum Note (Signed)
Addended by: [name] on: 08/11/2019 02:10 PM   Modules accepted: Orders

## 2019-08-15 DIAGNOSIS — G4733 Obstructive sleep apnea (adult) (pediatric): Secondary | ICD-10-CM | POA: Diagnosis not present

## 2019-08-15 DIAGNOSIS — J449 Chronic obstructive pulmonary disease, unspecified: Secondary | ICD-10-CM | POA: Diagnosis not present

## 2019-09-03 ENCOUNTER — Other Ambulatory Visit: Payer: Self-pay | Admitting: Cardiology

## 2019-09-03 NOTE — Telephone Encounter (Signed)
Follow up:     patient daughter calling and would like for someone to call her concerning her fathers medication.

## 2019-09-03 NOTE — Telephone Encounter (Signed)
I spoke with pt's daughter who reports pt needs refills on metoprolol succinate and Ezetimibe.  I told her refills were sent in today. I called [facility] and confirmed they received refill prescriptions.

## 2019-09-09 DIAGNOSIS — G4733 Obstructive sleep apnea (adult) (pediatric): Secondary | ICD-10-CM | POA: Diagnosis not present

## 2019-09-14 DIAGNOSIS — G4733 Obstructive sleep apnea (adult) (pediatric): Secondary | ICD-10-CM | POA: Diagnosis not present

## 2019-09-14 DIAGNOSIS — J449 Chronic obstructive pulmonary disease, unspecified: Secondary | ICD-10-CM | POA: Diagnosis not present

## 2019-09-16 DIAGNOSIS — Z20828 Contact with and (suspected) exposure to other viral communicable diseases: Secondary | ICD-10-CM | POA: Diagnosis not present

## 2019-10-03 IMAGING — NM NM GI BLOOD LOSS
2 series · 12 of 12 positions shown · non-contrast
Comparison: None.

CLINICAL DATA: Active gastrointestinal bleeding this morning.

EXAM:
NUCLEAR MEDICINE GASTROINTESTINAL BLEEDING SCAN
TECHNIQUE: Sequential abdominal images were obtained following intravenous
administration of Fc-77m labeled red blood cells.
RADIOPHARMACEUTICALS:  23.7 mCi Fc-77m pertechnetate in-vitro
labeled red cells.

[Series 1: gi bleed · 4.14mm/px · 6 of 60 frames shown (1 of 2)]
[frame 6/60]
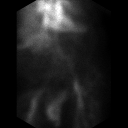
[frame 16/60]
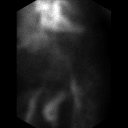
[frame 26/60]
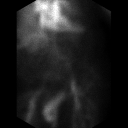
[frame 36/60]
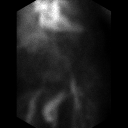
[frame 46/60]
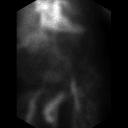
[frame 56/60]
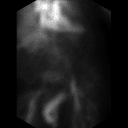

[Series 2: gi bleed · 4.14mm/px · 6 of 60 frames shown (2 of 2)]
[frame 6/60]
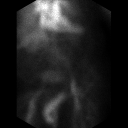
[frame 16/60]
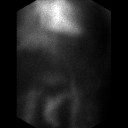
[frame 26/60]
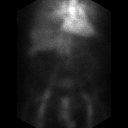
[frame 36/60]
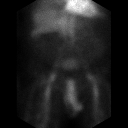
[frame 46/60]
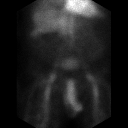
[frame 56/60]
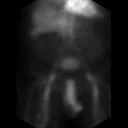

[12 of 12 positions shown; findings below may reference images not displayed]

FINDINGS: No tagged blood cell radiotracer activity conforming to small-bowel
or colon to localize active gastrointestinal bleeding. Exam
performed for 2 hours.

Physiologic activity noted in the solid organs and GU tract.
IMPRESSION: No evidence of active gastrointestinal bleeding over 2 hour study.

## 2019-10-09 DIAGNOSIS — G4733 Obstructive sleep apnea (adult) (pediatric): Secondary | ICD-10-CM | POA: Diagnosis not present

## 2019-10-15 DIAGNOSIS — G4733 Obstructive sleep apnea (adult) (pediatric): Secondary | ICD-10-CM | POA: Diagnosis not present

## 2019-10-15 DIAGNOSIS — J449 Chronic obstructive pulmonary disease, unspecified: Secondary | ICD-10-CM | POA: Diagnosis not present

## 2019-10-27 DIAGNOSIS — H26491 Other secondary cataract, right eye: Secondary | ICD-10-CM | POA: Diagnosis not present

## 2019-10-27 DIAGNOSIS — E119 Type 2 diabetes mellitus without complications: Secondary | ICD-10-CM | POA: Diagnosis not present

## 2019-10-27 DIAGNOSIS — H52203 Unspecified astigmatism, bilateral: Secondary | ICD-10-CM | POA: Diagnosis not present

## 2019-10-27 DIAGNOSIS — H401134 Primary open-angle glaucoma, bilateral, indeterminate stage: Secondary | ICD-10-CM | POA: Diagnosis not present

## 2019-11-09 DIAGNOSIS — G4733 Obstructive sleep apnea (adult) (pediatric): Secondary | ICD-10-CM | POA: Diagnosis not present

## 2019-11-15 DIAGNOSIS — G4733 Obstructive sleep apnea (adult) (pediatric): Secondary | ICD-10-CM | POA: Diagnosis not present

## 2019-11-15 DIAGNOSIS — J449 Chronic obstructive pulmonary disease, unspecified: Secondary | ICD-10-CM | POA: Diagnosis not present

## 2019-12-10 DIAGNOSIS — G4733 Obstructive sleep apnea (adult) (pediatric): Secondary | ICD-10-CM | POA: Diagnosis not present

## 2019-12-13 DIAGNOSIS — G4733 Obstructive sleep apnea (adult) (pediatric): Secondary | ICD-10-CM | POA: Diagnosis not present

## 2019-12-13 DIAGNOSIS — J449 Chronic obstructive pulmonary disease, unspecified: Secondary | ICD-10-CM | POA: Diagnosis not present

## 2019-12-27 ENCOUNTER — Ambulatory Visit: Payer: Medicare HMO | Attending: Internal Medicine

## 2019-12-27 DIAGNOSIS — Z23 Encounter for immunization: Secondary | ICD-10-CM

## 2019-12-27 NOTE — Progress Notes (Signed)
   Covid-19 Vaccination Clinic  Name:  [name]    MRN: [id number] DOB: May 19, 1930  12/27/2019  [name] was observed post Covid-19 immunization for 15 minutes without incident. He was provided with Vaccine Information Sheet and instruction to access the V-Safe system.   [name] was instructed to call 911 with any severe reactions post vaccine: Marland Kitchen Difficulty breathing  . Swelling of face and throat  . A fast heartbeat  . A bad rash all over body  . Dizziness and weakness   Immunizations Administered    Name Date Dose VIS Date Route   Pfizer COVID-19 Vaccine 12/27/2019 12:35 PM 0.3 mL 09/24/2019 Intramuscular   Manufacturer: [place]   Lot: [id number]   [place]: [id number]

## 2020-01-07 DIAGNOSIS — G4733 Obstructive sleep apnea (adult) (pediatric): Secondary | ICD-10-CM | POA: Diagnosis not present

## 2020-01-13 DIAGNOSIS — G4733 Obstructive sleep apnea (adult) (pediatric): Secondary | ICD-10-CM | POA: Diagnosis not present

## 2020-01-13 DIAGNOSIS — J449 Chronic obstructive pulmonary disease, unspecified: Secondary | ICD-10-CM | POA: Diagnosis not present

## 2020-01-18 ENCOUNTER — Ambulatory Visit: Payer: Medicare HMO

## 2020-01-19 ENCOUNTER — Ambulatory Visit: Payer: Medicare HMO | Attending: Internal Medicine

## 2020-01-19 DIAGNOSIS — Z23 Encounter for immunization: Secondary | ICD-10-CM

## 2020-01-19 NOTE — Progress Notes (Signed)
   Covid-19 Vaccination Clinic  Name:  EIKER SAED    MRN: [id number] DOB: 05/29/1930  01/19/2020  Mr. Rasmussen was observed post Covid-19 immunization for 15 minutes without incident. He was provided with Vaccine Information Sheet and instruction to access the V-Safe system.   Mr. Atlee was instructed to call 911 with any severe reactions post vaccine: Marland Kitchen Difficulty breathing  . Swelling of face and throat  . A fast heartbeat  . A bad rash all over body  . Dizziness and weakness   Immunizations Administered    Name Date Dose VIS Date Route   Pfizer COVID-19 Vaccine 01/19/2020  1:38 PM 0.3 mL 09/24/2019 Intramuscular   Manufacturer: [place]   Lot: [id number]   [place]: [id number]

## 2020-02-07 DIAGNOSIS — G4733 Obstructive sleep apnea (adult) (pediatric): Secondary | ICD-10-CM | POA: Diagnosis not present

## 2020-02-09 DIAGNOSIS — I130 Hypertensive heart and chronic kidney disease with heart failure and stage 1 through stage 4 chronic kidney disease, or unspecified chronic kidney disease: Secondary | ICD-10-CM | POA: Diagnosis not present

## 2020-02-09 DIAGNOSIS — D696 Thrombocytopenia, unspecified: Secondary | ICD-10-CM | POA: Diagnosis not present

## 2020-02-09 DIAGNOSIS — E7800 Pure hypercholesterolemia, unspecified: Secondary | ICD-10-CM | POA: Diagnosis not present

## 2020-02-09 DIAGNOSIS — I5030 Unspecified diastolic (congestive) heart failure: Secondary | ICD-10-CM | POA: Diagnosis not present

## 2020-02-09 DIAGNOSIS — G4733 Obstructive sleep apnea (adult) (pediatric): Secondary | ICD-10-CM | POA: Diagnosis not present

## 2020-02-09 DIAGNOSIS — E1159 Type 2 diabetes mellitus with other circulatory complications: Secondary | ICD-10-CM | POA: Diagnosis not present

## 2020-02-09 DIAGNOSIS — E039 Hypothyroidism, unspecified: Secondary | ICD-10-CM | POA: Diagnosis not present

## 2020-02-09 DIAGNOSIS — N183 Chronic kidney disease, stage 3 unspecified: Secondary | ICD-10-CM | POA: Diagnosis not present

## 2020-02-12 DIAGNOSIS — J449 Chronic obstructive pulmonary disease, unspecified: Secondary | ICD-10-CM | POA: Diagnosis not present

## 2020-02-12 DIAGNOSIS — G4733 Obstructive sleep apnea (adult) (pediatric): Secondary | ICD-10-CM | POA: Diagnosis not present

## 2020-03-08 DIAGNOSIS — G4733 Obstructive sleep apnea (adult) (pediatric): Secondary | ICD-10-CM | POA: Diagnosis not present

## 2020-03-14 DIAGNOSIS — G4733 Obstructive sleep apnea (adult) (pediatric): Secondary | ICD-10-CM | POA: Diagnosis not present

## 2020-03-14 DIAGNOSIS — J449 Chronic obstructive pulmonary disease, unspecified: Secondary | ICD-10-CM | POA: Diagnosis not present

## 2020-04-08 DIAGNOSIS — G4733 Obstructive sleep apnea (adult) (pediatric): Secondary | ICD-10-CM | POA: Diagnosis not present

## 2020-04-13 DIAGNOSIS — J449 Chronic obstructive pulmonary disease, unspecified: Secondary | ICD-10-CM | POA: Diagnosis not present

## 2020-04-13 DIAGNOSIS — G4733 Obstructive sleep apnea (adult) (pediatric): Secondary | ICD-10-CM | POA: Diagnosis not present

## 2020-04-25 ENCOUNTER — Other Ambulatory Visit: Payer: Self-pay | Admitting: Cardiology

## 2020-05-14 DIAGNOSIS — J449 Chronic obstructive pulmonary disease, unspecified: Secondary | ICD-10-CM | POA: Diagnosis not present

## 2020-05-14 DIAGNOSIS — G4733 Obstructive sleep apnea (adult) (pediatric): Secondary | ICD-10-CM | POA: Diagnosis not present

## 2020-06-12 ENCOUNTER — Other Ambulatory Visit: Payer: Self-pay | Admitting: Cardiology

## 2020-06-14 DIAGNOSIS — J449 Chronic obstructive pulmonary disease, unspecified: Secondary | ICD-10-CM | POA: Diagnosis not present

## 2020-06-14 DIAGNOSIS — G4733 Obstructive sleep apnea (adult) (pediatric): Secondary | ICD-10-CM | POA: Diagnosis not present

## 2020-07-06 ENCOUNTER — Other Ambulatory Visit: Payer: Self-pay

## 2020-07-06 ENCOUNTER — Telehealth (INDEPENDENT_AMBULATORY_CARE_PROVIDER_SITE_OTHER): Payer: Medicare HMO | Admitting: Cardiology

## 2020-07-06 ENCOUNTER — Encounter: Payer: Self-pay | Admitting: Cardiology

## 2020-07-06 ENCOUNTER — Telehealth: Payer: Self-pay | Admitting: *Deleted

## 2020-07-06 VITALS — Ht 65.0 in | Wt 184.0 lb

## 2020-07-06 DIAGNOSIS — E785 Hyperlipidemia, unspecified: Secondary | ICD-10-CM | POA: Diagnosis not present

## 2020-07-06 DIAGNOSIS — R55 Syncope and collapse: Secondary | ICD-10-CM

## 2020-07-06 DIAGNOSIS — G4733 Obstructive sleep apnea (adult) (pediatric): Secondary | ICD-10-CM

## 2020-07-06 DIAGNOSIS — I5032 Chronic diastolic (congestive) heart failure: Secondary | ICD-10-CM

## 2020-07-06 DIAGNOSIS — I2510 Atherosclerotic heart disease of native coronary artery without angina pectoris: Secondary | ICD-10-CM | POA: Diagnosis not present

## 2020-07-06 DIAGNOSIS — I10 Essential (primary) hypertension: Secondary | ICD-10-CM | POA: Diagnosis not present

## 2020-07-06 MED ORDER — NITROGLYCERIN 0.4 MG SL SUBL: 0.4000 mg | SUBLINGUAL_TABLET | SUBLINGUAL | 6 refills | Status: AC | PRN

## 2020-07-06 NOTE — Patient Instructions (Signed)
Medication Instructions:  Your physician recommends that you continue on your current medications as directed. Please refer to the Current Medication list given to you today.  *If you need a refill on your cardiac medications before your next appointment, please call your pharmacy*   Lab Work:  Your physician recommends that you return for lab work on 07/07/20--CMET and Lipids.  This will be fasting.  The lab opens at 7:30 AM  If you have labs (blood work) drawn today and your tests are completely normal, you will receive your results only by: Marland Kitchen MyChart Message (if you have MyChart) OR . A paper copy in the mail If you have any lab test that is abnormal or we need to change your treatment, we will call you to review the results.   Testing/Procedures: Your physician has recommended that you have a sleep study. This test records several body functions during sleep, including: brain activity, eye movement, oxygen and carbon dioxide blood levels, heart rate and rhythm, breathing rate and rhythm, the flow of air through your mouth and nose, snoring, body muscle movements, and chest and belly movement.     Follow-Up: At [facility], you and your health needs are our priority.  As part of our continuing mission to provide you with exceptional heart care, we have created designated Provider Care Teams.  These Care Teams include your primary Cardiologist (physician) and Advanced Practice Providers (APPs -  Physician Assistants and Nurse Practitioners) who all work together to provide you with the care you need, when you need it.  We recommend signing up for the patient portal called "MyChart".  Sign up information is provided on this After Visit Summary.  MyChart is used to connect with patients for Virtual Visits (Telemedicine).  Patients are able to view lab/test results, encounter notes, upcoming appointments, etc.  Non-urgent messages can be sent to your provider as well.   To learn more about  what you can do with MyChart, go to [email or web address].    Your next appointment:   12 month(s)  The format for your next appointment:   In Person  Provider:   Fransico Him, MD   Other Instructions

## 2020-07-06 NOTE — Telephone Encounter (Signed)
-----   Message from Thompson Grayer, RN sent at 07/06/2020 12:17 PM EDT ----- Regarding: home sleep study Dr Radford Pax has ordered a home sleep study for this patient. Thanks, Fraser Din

## 2020-07-06 NOTE — Addendum Note (Signed)
Addended by: Thompson Grayer on: 07/06/2020 12:13 PM   Modules accepted: Orders

## 2020-07-06 NOTE — Telephone Encounter (Signed)
  Patient Consent for Virtual Visit          [name] has provided verbal consent on 07/06/2020 for a virtual visit (video or telephone).   CONSENT FOR VIRTUAL VISIT FOR:  [name]  By participating in this virtual visit I agree to the following:  I hereby voluntarily request, consent and authorize [facility] and its employed or contracted physicians, physician assistants, nurse practitioners or other licensed health care professionals (the Practitioner), to provide me with telemedicine health care services (the "Services") as deemed necessary by the treating Practitioner. I acknowledge and consent to receive the Services by the Practitioner via telemedicine. I understand that the telemedicine visit will involve communicating with the Practitioner through live audiovisual communication technology and the disclosure of certain medical information by electronic transmission. I acknowledge that I have been given the opportunity to request an in-person assessment or other available alternative prior to the telemedicine visit and am voluntarily participating in the telemedicine visit.  I understand that I have the right to withhold or withdraw my consent to the use of telemedicine in the course of my care at any time, without affecting my right to future care or treatment, and that the Practitioner or I may terminate the telemedicine visit at any time. I understand that I have the right to inspect all information obtained and/or recorded in the course of the telemedicine visit and may receive copies of available information for a reasonable fee.  I understand that some of the potential risks of receiving the Services via telemedicine include:  Marland Kitchen Delay or interruption in medical evaluation due to technological equipment failure or disruption; . Information transmitted may not be sufficient (e.g. poor resolution of images) to allow for appropriate medical decision making by the Practitioner;  and/or  . In rare instances, security protocols could fail, causing a breach of personal health information.  Furthermore, I acknowledge that it is my responsibility to provide information about my medical history, conditions and care that is complete and accurate to the best of my ability. I acknowledge that Practitioner's advice, recommendations, and/or decision may be based on factors not within their control, such as incomplete or inaccurate data provided by me or distortions of diagnostic images or specimens that may result from electronic transmissions. I understand that the practice of medicine is not an exact science and that Practitioner makes no warranties or guarantees regarding treatment outcomes. I acknowledge that a copy of this consent can be made available to me via my patient portal ([facility]), or I can request a printed copy by calling the office of [facility].    I understand that my insurance will be billed for this visit.   I have read or had this consent read to me. . I understand the contents of this consent, which adequately explains the benefits and risks of the Services being provided via telemedicine.  . I have been provided ample opportunity to ask questions regarding this consent and the Services and have had my questions answered to my satisfaction. . I give my informed consent for the services to be provided through the use of telemedicine in my medical care

## 2020-07-06 NOTE — Progress Notes (Signed)
Virtual Visit via telephone Note   This visit type was conducted due to national recommendations for restrictions regarding the COVID-19 Pandemic (e.g. social distancing) in an effort to limit this patient's exposure and mitigate transmission in our community.  Due to his co-morbid illnesses, this patient is at least at moderate risk for complications without adequate follow up.  This format is felt to be most appropriate for this patient at this time.  All issues noted in this document were discussed and addressed.  A limited physical exam was performed with this format.  Please refer to the patient's chart for his consent to telehealth for [facility].   Evaluation Performed:  Follow-up visit  This visit type was conducted due to national recommendations for restrictions regarding the COVID-19 Pandemic (e.g. social distancing).  This format is felt to be most appropriate for this patient at this time.  All issues noted in this document were discussed and addressed.  No physical exam was performed (except for noted visual exam findings with Video Visits).  Please refer to the patient's chart (MyChart message for video visits and phone note for telephone visits) for the patient's consent to telehealth for [facility].  Date:  07/06/2020   ID:  ANTIONNE ENRIQUE, DOB [birth year], MRN [id number]  Patient Location:  Home  Provider location:   [place]  PCP:  [name], MD  Cardiologist:  Fransico Him, MD  Electrophysiologist:  None   Chief Complaint:  CHF, OSA, CAD, DM  History of Present Illness:    YVONNE PETITE is a 86 y.o. male who presents via audio/video conferencing for a telehealth visit today.    GUS LITTLER is a 86 y.o. male with a hx of ASCAD (s/p PCI of RCA 6/2010with residual70% ramus and 90% RCA, cath 01/2011 showed patent stents in RCA with moderate prox disesae, aneurysmal left circ and small 90% ramus s/p PCI),  He also has a hx of HTN, dyslipidemia, chronic  diastolic CHF, obesity and OSA on CPAP.    He is here today for followup and is doing well.  He denies any chest pain or pressure, SOB, DOE, PND, orthopnea, LE edema, dizziness, palpitations or syncope. He is compliant with his meds and is tolerating meds with no SE.   He tells me that he stopped using his CPAP because he thinks he doesn't need it.   The patient does not have symptoms concerning for COVID-19 infection (fever, chills, cough, or new shortness of breath).    Prior CV studies:   The following studies were reviewed today:  PAP compliance download  Past Medical History:  Diagnosis Date  . Anxiety   . Asthma   . Chronic diastolic CHF (congestive heart failure) (HCC)    diastolic   . COPD (chronic obstructive pulmonary disease) ([place])   . Coronary artery disease    s/p PCI of RCA 03/2009 at which time there was 70% ramus and 90% RCA, cath 01/2011 showed patent stents in RCA with moderate prox disesae, aneurysmal left circ and small 90% ramus s/p PCI, patent LAD EF 55%  . Diabetes mellitus   . Diverticulitis Oct. 2013   bleeding in the past and Effient for his CAD was stopped  . DVT (deep venous thrombosis) ([place])   . GERD (gastroesophageal reflux disease)   . Hypercholesterolemia   . Hypertension   . Hypothyroidism   . Obesity (BMI 30-39.9)   . OSA (obstructive sleep apnea)    severe on CPAP  .  Peripheral vascular disease ([place]) 11/2006   s/p left stent  . Shortness of breath   . Sleep apnea    severe OSA awaiting CPAP titration   Past Surgical History:  Procedure Laterality Date  . ABDOMINAL AORTAGRAM N/A 04/20/2012   Procedure: ABDOMINAL Maxcine Ham;  Surgeon: Angelia Mould, MD;  Location: [facility] CATH LAB;  Service: Cardiovascular;  Laterality: N/A;  . ABDOMINAL AORTAGRAM N/A 12/29/2012   Procedure: ABDOMINAL Maxcine Ham;  Surgeon: Serafina Mitchell, MD;  Location: [facility] CATH LAB;  Service: Cardiovascular;  Laterality: N/A;  . BALLOON DILATION  12/26/2011   Procedure: BALLOON  DILATION;  Surgeon: Garlan Fair, MD;  Location: [facility] ENDOSCOPY;  Service: Endoscopy;  Laterality: N/A;  . COLONOSCOPY  08/14/2012   Procedure: COLONOSCOPY;  Surgeon: Arta Silence, MD;  Location: [facility] ENDOSCOPY;  Service: Endoscopy;  Laterality: N/A;  . CORONARY STENT PLACEMENT  2010 and 2012  . ESOPHAGOGASTRODUODENOSCOPY  12/26/2011   Procedure: ESOPHAGOGASTRODUODENOSCOPY (EGD);  Surgeon: Garlan Fair, MD;  Location: [facility] ENDOSCOPY;  Service: Endoscopy;  Laterality: N/A;  . ESOPHAGOGASTRODUODENOSCOPY  08/13/2012   Procedure: ESOPHAGOGASTRODUODENOSCOPY (EGD);  Surgeon: Arta Silence, MD;  Location: [facility] ENDOSCOPY;  Service: Endoscopy;  Laterality: Left;  . HERNIA REPAIR  1992   evntral hernia repair  . LOWER EXTREMITY ANGIOGRAM Bilateral 12/29/2012   Procedure: LOWER EXTREMITY ANGIOGRAM;  Surgeon: Serafina Mitchell, MD;  Location: [facility] CATH LAB;  Service: Cardiovascular;  Laterality: Bilateral;  bilat lower extrem angio     Current Meds  Medication Sig  . albuterol (PROVENTIL HFA;VENTOLIN HFA) 108 (90 BASE) MCG/ACT inhaler Inhale 2 puffs into the lungs every 6 (six) hours as needed for wheezing or shortness of breath.   Marland Kitchen albuterol (PROVENTIL) (2.5 MG/3ML) 0.083% nebulizer solution Take 3 mLs (2.5 mg total) by nebulization every 4 (four) hours as needed for wheezing or shortness of breath.  Marland Kitchen aspirin EC 81 MG tablet Take 81 mg by mouth daily.  . budesonide-formoterol (SYMBICORT) 160-4.5 MCG/ACT inhaler Inhale 2 puffs into the lungs 2 (two) times daily.  Marland Kitchen ezetimibe (ZETIA) 10 MG tablet Take 1 tablet by mouth once daily  . furosemide (LASIX) 40 MG tablet Take 40 mg by mouth daily as needed.   Marland Kitchen glimepiride (AMARYL) 2 MG tablet Take 2 mg by mouth every evening.   Marland Kitchen levothyroxine (SYNTHROID, LEVOTHROID) 150 MCG tablet Take 150 mcg by mouth every evening.   . meloxicam (MOBIC) 15 MG tablet Take 15 mg by mouth daily as needed.   . metoprolol succinate (TOPROL-XL) 50 MG 24 hr tablet Take 1 tablet by  mouth once daily  . nitroGLYCERIN (NITROSTAT) 0.4 MG SL tablet Place 1 tablet (0.4 mg total) under the tongue every 5 (five) minutes as needed for chest pain.  . rosuvastatin (CRESTOR) 20 MG tablet Take 1 tablet (20 mg total) by mouth daily. Please make yearly appt with Dr. Radford Pax for September for before anymore refills. 1st attempt  . [DISCONTINUED] nitroGLYCERIN (NITROSTAT) 0.4 MG SL tablet Place 0.4 mg under the tongue every 5 (five) minutes as needed for chest pain.     Allergies:   Clopidogrel   Social History   Tobacco Use  . Smoking status: Former Smoker    Packs/day: 0.50    Years: 15.00    Pack years: 7.50    Types: Cigarettes, Cigars    Quit date: 10/15/1963    Years since quitting: 56.7  . Smokeless tobacco: Never Used  Vaping Use  . Vaping Use: Never used  Substance Use  Topics  . Alcohol use: No  . Drug use: No     Family Hx: The patient's family history includes Asthma in his sister; Breast cancer in his sister; Diabetes in his brother, mother, and sister; Heart attack in his mother and sister; Heart disease in his father, mother, and sister; Hyperlipidemia in his mother, sister, and sister; Hypertension in his mother, sister, and sister. There is no history of Malignant hyperthermia.  ROS:   Please see the history of present illness.     All other systems reviewed and are negative.   Labs/Other Tests and Data Reviewed:    Recent Labs: 07/14/2019: ALT 17   Recent Lipid Panel Lab Results  Component Value Date/Time   CHOL 129 07/14/2019 11:08 AM   TRIG 82 07/14/2019 11:08 AM   HDL 49 07/14/2019 11:08 AM   CHOLHDL 2.6 07/14/2019 11:08 AM   CHOLHDL 4 11/16/2014 11:24 AM   LDLCALC 64 07/14/2019 11:08 AM    Wt Readings from Last 3 Encounters:  07/06/20 184 lb (83.5 kg)  07/14/19 201 lb (91.2 kg)  07/06/19 185 lb (83.9 kg)     Objective:    Vital Signs:  Ht 5\' 5"  (1.651 m)   Wt 184 lb (83.5 kg)   BMI 30.62 kg/m     ASSESSMENT & PLAN:    1.  ASCAD   - s/p PCI of RCA 6/2010with residual70% ramus and 90% RCA.   -Cath 01/2011 showed patent stents in RCA with moderate prox disesae, aneurysmal left circ and small 90% ramus s/p PCI.   -he denies any anginal symptoms since I saw him last -continue ASA, Toprol XL 50mg  daily and statin  2.  HTN  -continue Toprol XL 50mg  daily  3.  Chronic diastolic CHF  -his weight is stable and he has actually lost 17lbs in the past year and feels much better -he has not had any SOB but still has chronic LE edema -continue lasix 40mg  daily -check BMET    4. OSA  -he stopped using his CPAP because he does not think he needs it -his OSA was severe and I suspect he still has apnea -I will order a home sleep study  5.  Hyperlipidemia  -LDL goal < 70 -continue Crestor 40mg  daily and Zetia 10mg  daily -check FLP and ALT  6.  Syncope  - he has not had any further episodes of dizziness or syncope  COVID-19 Education: He has been vaccinated  Patient Risk:   After full review of this patient's clinical status, I feel that they are at least moderate risk at this time.  Time:   Today, I have spent 20 minutes on telemedicine discussing medical problems including CHF, CAD, HLD, HTN and reviewing patient's chart including PAP download.  Medication Adjustments/Labs and Tests Ordered: Current medicines are reviewed at length with the patient today.  Concerns regarding medicines are outlined above.  Tests Ordered: No orders of the defined types were placed in this encounter.  Medication Changes: Meds ordered this encounter  Medications  . nitroGLYCERIN (NITROSTAT) 0.4 MG SL tablet    Sig: Place 1 tablet (0.4 mg total) under the tongue every 5 (five) minutes as needed for chest pain.    Dispense:  25 tablet    Refill:  6    Patient will call when he needs filled    Disposition:  Follow up in 6 month(s) virtual  Signed, Fransico Him, MD  07/06/2020 10:31 AM    [facility]

## 2020-07-06 NOTE — Telephone Encounter (Signed)
Staff message sent to Gae Bon ok to schedule HST. PA is not required.

## 2020-07-07 ENCOUNTER — Other Ambulatory Visit: Payer: Medicare HMO | Admitting: *Deleted

## 2020-07-07 ENCOUNTER — Other Ambulatory Visit: Payer: Self-pay

## 2020-07-07 DIAGNOSIS — E785 Hyperlipidemia, unspecified: Secondary | ICD-10-CM | POA: Diagnosis not present

## 2020-07-07 DIAGNOSIS — I2510 Atherosclerotic heart disease of native coronary artery without angina pectoris: Secondary | ICD-10-CM | POA: Diagnosis not present

## 2020-07-07 LAB — COMPREHENSIVE METABOLIC PANEL
ALT: 16 IU/L (ref 0–44)
AST: 25 IU/L (ref 0–40)
Albumin/Globulin Ratio: 1.7 (ref 1.2–2.2)
Albumin: 4 g/dL (ref 3.6–4.6)
Alkaline Phosphatase: 65 IU/L (ref 44–121)
BUN/Creatinine Ratio: 12 (ref 10–24)
BUN: 14 mg/dL (ref 8–27)
Bilirubin Total: 0.4 mg/dL (ref 0.0–1.2)
CO2: 28 mmol/L (ref 20–29)
Calcium: 10.1 mg/dL (ref 8.6–10.2)
Chloride: 101 mmol/L (ref 96–106)
Creatinine, Ser: 1.18 mg/dL (ref 0.76–1.27)
GFR calc Af Amer: 63 mL/min/{1.73_m2} (ref >59)
GFR calc non Af Amer: 55 mL/min/{1.73_m2} — ABNORMAL LOW (ref >59)
Globulin, Total: 2.4 g/dL (ref 1.5–4.5)
Glucose: 129 mg/dL — ABNORMAL HIGH (ref 65–99)
Potassium: 4.5 mmol/L (ref 3.5–5.2)
Sodium: 140 mmol/L (ref 134–144)
Total Protein: 6.4 g/dL (ref 6.0–8.5)

## 2020-07-07 LAB — LIPID PANEL
Chol/HDL Ratio: 2.4 ratio (ref 0.0–5.0)
Cholesterol, Total: 125 mg/dL (ref 100–199)
HDL: 53 mg/dL (ref >39)
LDL Chol Calc (NIH): 57 mg/dL (ref 0–99)
Triglycerides: 72 mg/dL (ref 0–149)
VLDL Cholesterol Cal: 15 mg/dL (ref 5–40)

## 2020-07-07 NOTE — Telephone Encounter (Addendum)
[name], CMA  [name], CMA Ok to schedule HST. No PA is required.       Patient is aware and agreeable to Home Sleep Study through [facility]. Patient is scheduled for 08/04/20 at 1 pm to pick up home sleep kit and meet with Respiratory therapist at [facility]. Patient is aware that if this appointment date and time does not work for them they should contact [name] directly at [phone]. Patient is aware that a sleep packet will be sent from [facility] in week. Patient is agreeable to treatment and thankful for call.

## 2020-07-14 DIAGNOSIS — G4733 Obstructive sleep apnea (adult) (pediatric): Secondary | ICD-10-CM | POA: Diagnosis not present

## 2020-07-14 DIAGNOSIS — J449 Chronic obstructive pulmonary disease, unspecified: Secondary | ICD-10-CM | POA: Diagnosis not present

## 2020-07-26 ENCOUNTER — Other Ambulatory Visit: Payer: Self-pay

## 2020-07-26 MED ORDER — ROSUVASTATIN CALCIUM 20 MG PO TABS
20.0000 mg | ORAL_TABLET | Freq: Every day | ORAL | 3 refills | Status: DC
Start: 2020-07-26 — End: 2021-07-02

## 2020-08-04 ENCOUNTER — Encounter (HOSPITAL_BASED_OUTPATIENT_CLINIC_OR_DEPARTMENT_OTHER): Payer: Medicare HMO | Admitting: Cardiology

## 2020-08-14 DIAGNOSIS — G4733 Obstructive sleep apnea (adult) (pediatric): Secondary | ICD-10-CM | POA: Diagnosis not present

## 2020-08-14 DIAGNOSIS — J449 Chronic obstructive pulmonary disease, unspecified: Secondary | ICD-10-CM | POA: Diagnosis not present

## 2020-08-31 DIAGNOSIS — F411 Generalized anxiety disorder: Secondary | ICD-10-CM | POA: Diagnosis not present

## 2020-08-31 DIAGNOSIS — E1159 Type 2 diabetes mellitus with other circulatory complications: Secondary | ICD-10-CM | POA: Diagnosis not present

## 2020-08-31 DIAGNOSIS — D696 Thrombocytopenia, unspecified: Secondary | ICD-10-CM | POA: Diagnosis not present

## 2020-08-31 DIAGNOSIS — E782 Mixed hyperlipidemia: Secondary | ICD-10-CM | POA: Diagnosis not present

## 2020-08-31 DIAGNOSIS — I130 Hypertensive heart and chronic kidney disease with heart failure and stage 1 through stage 4 chronic kidney disease, or unspecified chronic kidney disease: Secondary | ICD-10-CM | POA: Diagnosis not present

## 2020-08-31 DIAGNOSIS — R946 Abnormal results of thyroid function studies: Secondary | ICD-10-CM | POA: Diagnosis not present

## 2020-08-31 DIAGNOSIS — I509 Heart failure, unspecified: Secondary | ICD-10-CM | POA: Diagnosis not present

## 2020-08-31 DIAGNOSIS — J441 Chronic obstructive pulmonary disease with (acute) exacerbation: Secondary | ICD-10-CM | POA: Diagnosis not present

## 2020-08-31 DIAGNOSIS — Z0000 Encounter for general adult medical examination without abnormal findings: Secondary | ICD-10-CM | POA: Diagnosis not present

## 2020-08-31 DIAGNOSIS — N183 Chronic kidney disease, stage 3 unspecified: Secondary | ICD-10-CM | POA: Diagnosis not present

## 2020-08-31 DIAGNOSIS — I2510 Atherosclerotic heart disease of native coronary artery without angina pectoris: Secondary | ICD-10-CM | POA: Diagnosis not present

## 2020-08-31 DIAGNOSIS — E039 Hypothyroidism, unspecified: Secondary | ICD-10-CM | POA: Diagnosis not present

## 2020-09-01 DIAGNOSIS — N183 Chronic kidney disease, stage 3 unspecified: Secondary | ICD-10-CM | POA: Diagnosis not present

## 2020-09-01 DIAGNOSIS — I110 Hypertensive heart disease with heart failure: Secondary | ICD-10-CM | POA: Diagnosis not present

## 2020-09-06 DIAGNOSIS — R0902 Hypoxemia: Secondary | ICD-10-CM | POA: Diagnosis not present

## 2020-09-06 DIAGNOSIS — J449 Chronic obstructive pulmonary disease, unspecified: Secondary | ICD-10-CM | POA: Diagnosis not present

## 2020-09-06 DIAGNOSIS — G4733 Obstructive sleep apnea (adult) (pediatric): Secondary | ICD-10-CM | POA: Diagnosis not present

## 2020-09-06 DIAGNOSIS — J441 Chronic obstructive pulmonary disease with (acute) exacerbation: Secondary | ICD-10-CM | POA: Diagnosis not present

## 2020-09-13 DIAGNOSIS — G4733 Obstructive sleep apnea (adult) (pediatric): Secondary | ICD-10-CM | POA: Diagnosis not present

## 2020-09-13 DIAGNOSIS — J449 Chronic obstructive pulmonary disease, unspecified: Secondary | ICD-10-CM | POA: Diagnosis not present

## 2020-09-20 ENCOUNTER — Other Ambulatory Visit: Payer: Self-pay

## 2020-09-20 ENCOUNTER — Ambulatory Visit: Payer: Medicare HMO | Admitting: Internal Medicine

## 2020-09-20 ENCOUNTER — Encounter: Payer: Self-pay | Admitting: Internal Medicine

## 2020-09-20 VITALS — BP 118/70 | HR 70 | Ht 65.0 in | Wt 211.8 lb

## 2020-09-20 DIAGNOSIS — G4733 Obstructive sleep apnea (adult) (pediatric): Secondary | ICD-10-CM

## 2020-09-20 DIAGNOSIS — R600 Localized edema: Secondary | ICD-10-CM

## 2020-09-20 DIAGNOSIS — E669 Obesity, unspecified: Secondary | ICD-10-CM | POA: Diagnosis not present

## 2020-09-20 DIAGNOSIS — R0600 Dyspnea, unspecified: Secondary | ICD-10-CM | POA: Diagnosis not present

## 2020-09-20 DIAGNOSIS — Z8679 Personal history of other diseases of the circulatory system: Secondary | ICD-10-CM | POA: Diagnosis not present

## 2020-09-20 DIAGNOSIS — J449 Chronic obstructive pulmonary disease, unspecified: Secondary | ICD-10-CM | POA: Diagnosis not present

## 2020-09-20 DIAGNOSIS — R0609 Other forms of dyspnea: Secondary | ICD-10-CM

## 2020-09-20 NOTE — Patient Instructions (Addendum)
ICD-10-CM   1. Dyspnea on exertion  R06.00   2. Chronic obstructive pulmonary disease, unspecified COPD type (HCC)  J44.9   3. OSA (obstructive sleep apnea)  G47.33   4. Edema of extremities  R60.0   5. Obesity (BMI 30-39.9)  E66.9   6. History of chronic CHF  Z86.79     My concern is that you have a condition called called pulmonology that after years of stress from sleep apnea and from lung disease the right side of the heart undergo strain and the fluid gets backed up.  If this is the case unfortunately there is no cure for this.  We can only try to optimize as much as we can.  Nevertheless, we need to rule out other possibilities that could be causing her worsening shortness of breath over the last 2 years  Plan -Do echocardiogram -Do high-resolution CT chest supine and prone -Do full pulmonary function test -Do simple walking desaturation test in the office later this visit or at follow-up - Contnue breztri as scheduled  - contnue oxygen as prescribed by your PCP for night use and daytme as needed use  Follow-up - return to see NP or myself after tests - next few weesk

## 2020-09-20 NOTE — Progress Notes (Signed)
HPI  IOV 05/01/2018  Chief Complaint  Patient presents with  . Consult    Referred by Dr. Radford Pax due to SOB and COPD after diagnosis x2 years ago. Pt is a former [facility] pt last seen 12/27/13. Pt has c/o SOB that happens at anytime especially if he tries to rush and states he has an occ. CP. Denies any cough.    [name] , 86 y.o. , with dob Apr 08, 1930 and male ,Not Hispanic or Latino from [address] [place] [address] - presents to lung clinic for dyspnea, copd eval. He is 85 years old and referred by Dr. Radford Pax Hisdaughter is here with him. He tells me that he's had insidious onset of shortness of breath for the last several years. He is unable to quantify exactly. Nevertheless in the last few years is progressively worse despite being on Symbicort for the last few years. He has associated sleep apnea and is compliant with CPAP as managed by Dr. Radford Pax. He does have a mild cough but no sputum production. He has obesity but this has been stable. He denies much of smoking but this clear-cut emphysema on visualization of his images and on spirometry 4 years ago. His most recent hemoglobin appears okay.   CAT COPD Symptom & Quality of Life Score (GSK trademark) 0 is no burden. 5 is highest burden 05/01/2018   Never Cough -> Cough all the time 2  No phlegm in chest -> Chest is full of phlegm 2  No chest tightness -> Chest feels very tight 3  No dyspnea for 1 flight stairs/hill -> Very dyspneic for 1 flight of stairs 5  No limitations for ADL at home -> Very limited with ADL at home 3  Confident leaving home -> Not at all confident leaving home 3  Sleep soundly -> Do not sleep soundly because of lung condition 4  Lots of Energy -> No energy at all 3  TOTAL Score (max 40)  25      Spirometry in March 2015 and personally visualized image shows FEV1 0.97 L/45% with a ratio of 50% insistent with Gold stage III obstruction   Imaging that I personally visualized includes April  2019 chest x-ray that shows hyperinflation with possible bullous disease in the lung base. He has CT angiogram of the chest in February 2016 that ruled out pulmonary embolism. I personally visualized this and did not see any mass. He does have emphysema. Pulmonary embolism was ruled out.     Results for [name], [name] (MRN [id number]) as of 05/01/2018 09:14  Ref. Range 01/26/2018 09:56  Hemoglobin Latest Ref Range: 13.0 - 17.7 g/dL 13.8  Results for [name], [name] (MRN [id number]) as of 05/01/2018 09:14  Ref. Range 01/26/2018 09:56  Creatinine Latest Ref Range: 0.76 - 1.27 mg/dL 1.02    OV Aug 5224  86 year old male, former smoker (quit >50 yeas ago). PMH obesity, emphysema, OSA on CPAP. Patient of Dr. Chase Caller, last seen on 7/19.  Referred by Dr. Heron Nay for dyspnea and COPD eval. Reports progression of sob over the last several years despite being on symbicort. Mild cough with no sputum production. Evidence of emphysema on imaging and spirometry. CAT score 25. Spirometry 12/2013- FEV1 0.97, ratio 50% consistent with COPD GOLD III obstruction. Dr. Chase Caller felt dyspnea likely d/t obesity, physical deconditioning and diastolic dysfunction associated COPD. Plan repeat full PFTs to re-stage COPD, HRCT and alpha 1 antitypsin.   Tests: >>HRCT  05/11/18- IMPRESSION: No findings to suggest interstitial lung disease. Mild diffuse bronchial wall thickening with mild centrilobular and paraseptal emphysema; imaging findings compatible with the reported clinical history of COPD. >>PFTs 05/20/18- FVC 1.63 (60%), FEV1  0.77 (42%), Ratio 48 (65%)   Presents today for follow up dyspnea with his wife. Reports progressive worsening of breathing over the past few years. His main goal is to be able to fish. Continues Symbicort as prescribed. Wearing CPAP every night. Denies cough or wheeze.  Patient had pulmonary function test today showing obstruction, no bronchodilator response. Unable to complete DLCO or lung  volumes d/t patient not being able to perform the proper technique. HRCT completed showing no evidence of ILD.    OV 09/20/2020  Subjective:  Patient ID: [name], male , DOB: Mar 27, 1930 , age 34 y.o. , MRN: [id number] , ADDRESS: [address] [place] [address] PCP [name], MD Patient Care Team: [name], MD as PCP - General (Family Medicine) Sueanne Margarita, MD as PCP - Cardiology (Cardiology) Sueanne Margarita, MD as Attending Physician (Cardiology)  This Provider for this visit: Treatment Team:  Attending Provider: Brand Males, MD    09/20/2020 -   Chief Complaint  Patient presents with  . Follow-up    Pt last seen 05/20/18. Pt has been having problems with fluid in lungs and also has had increased SOB.  Pt does now have oxygen provided by [employer] that he wears 2L as needed.     HPI [name] 86 y.o. -presents for routine follow-up.  He presents with his daughter.  He has 5 daughters no sons.  One of the daughters is here.  I last saw him 2 and half years ago and at that time got pulmonary function test that showed COPD.  He is followed up with nurse practitioner.  He got started on inhaler therapy.  After that because of the pandemic lost to follow-up.  Daughter tells me he has progressive worsening of shortness of breath.  He gets short of breath just walking from room to room.  Nevertheless COPD CAT score appears the same.  They are more concerned about progressive edema he now has anasarca with chronic lymphedema in the feet.  This is despite diuretics.  Therefore they have been referred to pulmonary to reestablish.  He has a history of diastolic heart failure with the last echocardiogram was in 2017.  He has a history of sleep apnea with some mild hypercarbia as documented below but he does not use a CPAP on a compliant fashion.  He admits that is easily.  His last CT scan of the chest was a few years ago did not show any ILD.  He is recently been  given oxygen by his primary care physician.  Inhalers have been changed around.  Primary care physician notes reviewed.  His most recent hemoglobin in April 2021 was normal at 14.5 g%.  Recent kidney function is stable.  He is quite functional.  He does not use a cane.  He is cognitively intact.     CAT Score 09/20/2020 05/01/2018  Total CAT Score 24 25    Results for [name], [name] (MRN [id number]) as of 09/20/2020 10:51  Ref. Range 07/07/2020 09:48  Creatinine Latest Ref Range: 0.76 - 1.27 mg/dL 1.18   Results for [name], [name] (MRN [id number]) as of 09/20/2020 10:51  Ref. Range 09/06/2018 05:43 04/01/2019 10:25 04/01/2019 13:03 07/14/2019 11:08 07/07/2020 09:48  Hemoglobin Latest Ref Range:  13.0 - 17.0 g/dL 12.7 (L) 15.7       PFT Results Latest Ref Rng & Units 05/20/2018  FVC-Pre L 1.63  FVC-Predicted Pre % 60  FVC-Post L 1.71  FVC-Predicted Post % 63  Pre FEV1/FVC % % 48  Post FEV1/FCV % % 46  FEV1-Pre L 0.77  FEV1-Predicted Pre % 42  FEV1-Post L 0.79   IMPRESSION: 1. No findings to suggest interstitial lung disease. 2. Mild diffuse bronchial wall thickening with mild centrilobular and paraseptal emphysema; imaging findings compatible with the reported clinical history of COPD. 3. Aortic atherosclerosis, in addition to left main and 3 vessel coronary artery disease. 4. There are calcifications of the aortic valve. Echocardiographic correlation for evaluation of potential valvular dysfunction may be warranted if clinically indicated.  Aortic Atherosclerosis (ICD10-I70.0) and Emphysema (ICD10-J43.9).   Electronically Signed   By: Vinnie Langton M.D.   On: 05/11/2018 14:   ROS - per HPI  ECHO dec 2017   -------------------------------------------------------------------  Study Conclusions   - Left ventricle: The cavity size was normal. Wall thickness was  increased in a pattern of mild LVH. Systolic function was normal.  The estimated ejection fraction was  in the range of 60% to 65%.  Wall motion was normal; there were no regional wall motion  abnormalities. Doppler parameters are consistent with abnormal  left ventricular relaxation (grade 1 diastolic dysfunction). The  E/e&' ratio is between 8-15, suggesting indeterminate LV filling  pressure.  - Aortic valve: Poorly visualized. Sclerosis without stenosis.  - Left atrium: The atrium was normal in size.  - Tricuspid valve: There was trivial regurgitation.  - Pulmonary arteries: PA peak pressure: 30 mm Hg (S).  - Inferior vena cava: The vessel was normal in size. The  respirophasic diameter changes were in the normal range (>= 50%),  consistent with normal central venous pressure.   Impressions:   xxxxxxxxxxxxxxxx Results for [name], [name] (MRN [id number]) as of 09/20/2020 10:51  Ref. Range 11/16/2014 22:55  Sample type Unknown ARTERIAL  pH, Arterial Latest Ref Range: 7.35 - 7.45  7.352  pCO2 arterial Latest Ref Range: 35 - 45 mmHg 52.4 (H)  pO2, Arterial Latest Ref Range: 80 - 100 mmHg 67.0 (L)     has a past medical history of Anxiety, Asthma, Chronic diastolic CHF (congestive heart failure) (HCC), COPD (chronic obstructive pulmonary disease) ([place]), Coronary artery disease, Diabetes mellitus, Diverticulitis (Oct. 2013), DVT (deep venous thrombosis) (HCC), GERD (gastroesophageal reflux disease), Hypercholesterolemia, Hypertension, Hypothyroidism, Obesity (BMI 30-39.9), OSA (obstructive sleep apnea), Peripheral vascular disease ([place]) (11/2006), Shortness of breath, and Sleep apnea.   reports that he quit smoking about 56 years ago. His smoking use included cigarettes and cigars. He has a 7.50 pack-year smoking history. He has never used smokeless tobacco.  Past Surgical History:  Procedure Laterality Date  . ABDOMINAL AORTAGRAM N/A 04/20/2012   Procedure: ABDOMINAL Maxcine Ham;  Surgeon: Angelia Mould, MD;  Location: [facility] CATH LAB;  Service: Cardiovascular;  Laterality:  N/A;  . ABDOMINAL AORTAGRAM N/A 12/29/2012   Procedure: ABDOMINAL Maxcine Ham;  Surgeon: Serafina Mitchell, MD;  Location: [facility] CATH LAB;  Service: Cardiovascular;  Laterality: N/A;  . BALLOON DILATION  12/26/2011   Procedure: BALLOON DILATION;  Surgeon: Garlan Fair, MD;  Location: [facility] ENDOSCOPY;  Service: Endoscopy;  Laterality: N/A;  . COLONOSCOPY  08/14/2012   Procedure: COLONOSCOPY;  Surgeon: Arta Silence, MD;  Location: [facility] ENDOSCOPY;  Service: Endoscopy;  Laterality: N/A;  . CORONARY STENT PLACEMENT  2010 and 2012  .  ESOPHAGOGASTRODUODENOSCOPY  12/26/2011   Procedure: ESOPHAGOGASTRODUODENOSCOPY (EGD);  Surgeon: Garlan Fair, MD;  Location: [facility] ENDOSCOPY;  Service: Endoscopy;  Laterality: N/A;  . ESOPHAGOGASTRODUODENOSCOPY  08/13/2012   Procedure: ESOPHAGOGASTRODUODENOSCOPY (EGD);  Surgeon: Arta Silence, MD;  Location: [facility] ENDOSCOPY;  Service: Endoscopy;  Laterality: Left;  . HERNIA REPAIR  1992   evntral hernia repair  . LOWER EXTREMITY ANGIOGRAM Bilateral 12/29/2012   Procedure: LOWER EXTREMITY ANGIOGRAM;  Surgeon: Serafina Mitchell, MD;  Location[facility] CATH LAB;  Service: Cardiovascular;  Laterality: Bilateral;  bilat lower extrem angio    Allergies  Allergen Reactions  . Clopidogrel Itching    Immunization History  Administered Date(s) Administered  . Influenza Split 07/14/2013  . Influenza, High Dose Seasonal PF 07/14/2017  . PFIZER SARS-COV-2 Vaccination 12/27/2019, 01/19/2020  . Pneumococcal-Unspecified 08/14/2013  . Tdap 01/12/2019    Family History  Problem Relation Age of Onset  . Diabetes Mother   . Hyperlipidemia Mother   . Hypertension Mother   . Heart attack Mother   . Heart disease Mother        before age 62  . Diabetes Brother   . Heart disease Father        before age 52  . Breast cancer Sister   . Diabetes Sister   . Hyperlipidemia Sister   . Hypertension Sister   . Heart attack Sister   . Hyperlipidemia Sister   . Hypertension Sister   . Heart  disease Sister        Heart Disease before age 73  . Asthma Sister   . Malignant hyperthermia Neg Hx      Current Outpatient Medications:  .  albuterol (PROVENTIL HFA;VENTOLIN HFA) 108 (90 BASE) MCG/ACT inhaler, Inhale 2 puffs into the lungs every 6 (six) hours as needed for wheezing or shortness of breath. , Disp: , Rfl:  .  albuterol (PROVENTIL) (2.5 MG/3ML) 0.083% nebulizer solution, Take 3 mLs (2.5 mg total) by nebulization every 4 (four) hours as needed for wheezing or shortness of breath., Disp: 75 mL, Rfl: 0 .  aspirin EC 81 MG tablet, Take 81 mg by mouth daily., Disp: , Rfl:  .  Budeson-Glycopyrrol-Formoterol (BREZTRI AEROSPHERE) 160-9-4.8 MCG/ACT AERO, Inhale 2 puffs into the lungs in the morning and at bedtime., Disp: , Rfl:  .  budesonide (PULMICORT) 0.5 MG/2ML nebulizer solution, Take 0.5 mg by nebulization 2 (two) times daily., Disp: , Rfl:  .  ezetimibe (ZETIA) 10 MG tablet, Take 1 tablet by mouth once daily, Disp: 60 tablet, Rfl: 1 .  furosemide (LASIX) 40 MG tablet, Take 40 mg by mouth daily as needed. , Disp: , Rfl:  .  glimepiride (AMARYL) 2 MG tablet, Take 2 mg by mouth every evening. , Disp: , Rfl:  .  ipratropium-albuterol (DUONEB) 0.5-2.5 (3) MG/3ML SOLN, Take 3 mLs by nebulization in the morning, at noon, in the evening, and at bedtime., Disp: , Rfl:  .  levothyroxine (SYNTHROID, LEVOTHROID) 150 MCG tablet, Take 150 mcg by mouth every evening. , Disp: , Rfl:  .  meloxicam (MOBIC) 15 MG tablet, Take 15 mg by mouth daily as needed. , Disp: , Rfl:  .  metoprolol succinate (TOPROL-XL) 50 MG 24 hr tablet, Take 1 tablet by mouth once daily, Disp: 60 tablet, Rfl: 1 .  rosuvastatin (CRESTOR) 20 MG tablet, Take 1 tablet (20 mg total) by mouth daily., Disp: 90 tablet, Rfl: 3 .  nitroGLYCERIN (NITROSTAT) 0.4 MG SL tablet, Place 1 tablet (0.4 mg total) under the  tongue every 5 (five) minutes as needed for chest pain. (Patient not taking: Reported on 09/20/2020), Disp: 25 tablet, Rfl:  6      Objective:   Vitals:   09/20/20 1044  BP: 118/70  Pulse: 70  SpO2: 96%  Weight: 211 lb 12.8 oz (96.1 kg)  Height: 5\' 5"  (1.651 m)    Estimated body mass index is 35.25 kg/m as calculated from the following:   Height as of this encounter: 5\' 5"  (1.651 m).   Weight as of this encounter: 211 lb 12.8 oz (96.1 kg).  @WEIGHTCHANGE @  [employer]   09/20/20 1044  Weight: 211 lb 12.8 oz (96.1 kg)     Physical Exam  General Appearance:    Alert, cooperative, no distress, appears stated age - looks younger , Deconditioned looking - no , OBESE  - yes, Sitting on Wheelchair -  no  Head:    Normocephalic, without obvious abnormality, atraumatic  Eyes:    PERRL, conjunctiva/corneas clear,  Ears:    Normal TM's and external ear canals, both ears  Nose:   Nares normal, septum midline, mucosa normal, no drainage    or sinus tenderness. OXYGEN ON  - no . Patient is @ ra   Throat:   Lips, mucosa, and tongue normal; teeth and gums normal. Cyanosis on lips - no  Neck:   Supple, symmetrical, trachea midline, no adenopathy;    thyroid:  no enlargement/tenderness/nodules; no carotid   bruit or JVD  Back:     Symmetric, no curvature, ROM normal, no CVA tenderness  Lungs:     Distress - no , Wheeze no, Barrell Chest - no, Purse lip breathing - no, Crackles - no   Chest Wall:    No tenderness or deformity.    Heart:    Regular rate and rhythm, S1 and S2 normal, no rub   or gallop, Murmur - no  Breast Exam:    NOT DONE  Abdomen:     Soft, non-tender, bowel sounds active all four quadrants,    no masses, no organomegaly. Visceral obesity - yes  Genitalia:   NOT DONE  Rectal:   NOT DONE  Extremities:   Extremities - normal, Has Cane - no, Clubbing - no, Edema -  ++++ significant   Pulses:   2+ and symmetric all extremities  Skin:   Stigmata of Connective Tissue Disease - no  Lymph nodes:   Cervical, supraclavicular, and axillary nodes normal  Psychiatric:  Neurologic:   Pleasant -  yes, Anxious - no, Flat affect - no  CAm-ICU - neg, Alert and Oriented x 3 - yes, Moves all 4s - yes, Speech - normal, Cognition - intact        Assessment:       ICD-10-CM   1. Dyspnea on exertion  R06.00 ECHOCARDIOGRAM COMPLETE    Pulmonary function test    CT Chest High Resolution  2. Chronic obstructive pulmonary disease, unspecified COPD type (HCC)  J44.9   3. OSA (obstructive sleep apnea)  G47.33   4. Edema of extremities  R60.0   5. Obesity (BMI 30-39.9)  E66.9   6. History of chronic CHF  Z86.79        Plan:     Patient Instructions     ICD-10-CM   1. Dyspnea on exertion  R06.00   2. Chronic obstructive pulmonary disease, unspecified COPD type (HCC)  J44.9   3. OSA (obstructive sleep apnea)  G47.33   4. Edema of  extremities  R60.0   5. Obesity (BMI 30-39.9)  E66.9   6. History of chronic CHF  Z86.79     My concern is that you have a condition called called pulmonology that after years of stress from sleep apnea and from lung disease the right side of the heart undergo strain and the fluid gets backed up.  If this is the case unfortunately there is no cure for this.  We can only try to optimize as much as we can.  Nevertheless, we need to rule out other possibilities that could be causing her worsening shortness of breath over the last 2 years  Plan -Do echocardiogram -Do high-resolution CT chest supine and prone -Do full pulmonary function test -Do simple walking desaturation test in the office later this visit or at follow-up - Contnue breztri as scheduled  - contnue oxygen as prescribed by your PCP for night use and daytme as needed use  Follow-up - return to see NP or myself after tests - next few weesk     SIGNATURE    Dr. Brand Males, M.D., F.C.C.P,  Pulmonary and Critical Care Medicine Staff Physician, [place] Director - Interstitial Lung Disease  Program  Pulmonary [place] [facility] [place], [place], [address]  Pager: [phone], If no answer or between  15:00h - 7:00h: call [phone] Telephone: [phone]  11:12 AM 09/20/2020

## 2020-10-02 ENCOUNTER — Other Ambulatory Visit: Payer: Self-pay

## 2020-10-02 ENCOUNTER — Ambulatory Visit
Admission: RE | Admit: 2020-10-02 | Discharge: 2020-10-02 | Disposition: A | Discharge status: Home / Self Care | Payer: Medicare HMO | Source: Ambulatory Visit | Attending: Internal Medicine | Admitting: Internal Medicine

## 2020-10-02 DIAGNOSIS — R0609 Other forms of dyspnea: Secondary | ICD-10-CM

## 2020-10-03 ENCOUNTER — Telehealth: Payer: Self-pay | Admitting: Internal Medicine

## 2020-10-03 NOTE — Telephone Encounter (Signed)
Forwarding to MR as FYI.  

## 2020-10-03 NOTE — Telephone Encounter (Signed)
Per Lattie Haw w/ [facility], pt came in for a HRCT on 10/02/20 & had to cancel the appointment because the pt was unable to lie flat or propped up on a cushion to complete the CT.

## 2020-10-06 DIAGNOSIS — J441 Chronic obstructive pulmonary disease with (acute) exacerbation: Secondary | ICD-10-CM | POA: Diagnosis not present

## 2020-10-11 ENCOUNTER — Other Ambulatory Visit: Payer: Self-pay

## 2020-10-11 ENCOUNTER — Ambulatory Visit (HOSPITAL_COMMUNITY)
Admission: RE | Admit: 2020-10-11 | Discharge: 2020-10-11 | Disposition: A | Discharge status: Home / Self Care | Payer: Medicare HMO | Source: Ambulatory Visit | Attending: Internal Medicine | Admitting: Internal Medicine

## 2020-10-11 DIAGNOSIS — E785 Hyperlipidemia, unspecified: Secondary | ICD-10-CM | POA: Insufficient documentation

## 2020-10-11 DIAGNOSIS — K219 Gastro-esophageal reflux disease without esophagitis: Secondary | ICD-10-CM | POA: Insufficient documentation

## 2020-10-11 DIAGNOSIS — G4730 Sleep apnea, unspecified: Secondary | ICD-10-CM | POA: Diagnosis not present

## 2020-10-11 DIAGNOSIS — I10 Essential (primary) hypertension: Secondary | ICD-10-CM | POA: Diagnosis not present

## 2020-10-11 DIAGNOSIS — J449 Chronic obstructive pulmonary disease, unspecified: Secondary | ICD-10-CM | POA: Diagnosis not present

## 2020-10-11 DIAGNOSIS — R0609 Other forms of dyspnea: Secondary | ICD-10-CM

## 2020-10-11 DIAGNOSIS — Z87891 Personal history of nicotine dependence: Secondary | ICD-10-CM | POA: Diagnosis not present

## 2020-10-11 DIAGNOSIS — R0600 Dyspnea, unspecified: Secondary | ICD-10-CM | POA: Diagnosis not present

## 2020-10-11 DIAGNOSIS — I5030 Unspecified diastolic (congestive) heart failure: Secondary | ICD-10-CM | POA: Diagnosis not present

## 2020-10-11 DIAGNOSIS — E1151 Type 2 diabetes mellitus with diabetic peripheral angiopathy without gangrene: Secondary | ICD-10-CM | POA: Diagnosis not present

## 2020-10-11 LAB — ECHOCARDIOGRAM COMPLETE
Area-P 1/2: 4.06 cm2
Calc EF: 57.9 %
S' Lateral: 2.7 cm
Single Plane A2C EF: 55.4 %
Single Plane A4C EF: 59.9 %

## 2020-10-11 NOTE — Progress Notes (Signed)
  Echocardiogram 2D Echocardiogram has been performed.  Stark Bray Swaim 10/11/2020, 1:40 PM

## 2020-10-30 NOTE — Progress Notes (Signed)
Echo normal but for mild heart stiffness. Will discuss results at followup. Will not call him but If he calls you can share

## 2020-11-06 DIAGNOSIS — J441 Chronic obstructive pulmonary disease with (acute) exacerbation: Secondary | ICD-10-CM | POA: Diagnosis not present

## 2020-11-14 ENCOUNTER — Other Ambulatory Visit (HOSPITAL_COMMUNITY)
Admission: RE | Admit: 2020-11-14 | Discharge: 2020-11-14 | Disposition: A | Discharge status: Home / Self Care | Payer: Medicare HMO | Source: Ambulatory Visit | Attending: Internal Medicine | Admitting: Internal Medicine

## 2020-11-14 DIAGNOSIS — Z20822 Contact with and (suspected) exposure to covid-19: Secondary | ICD-10-CM | POA: Insufficient documentation

## 2020-11-14 DIAGNOSIS — Z01812 Encounter for preprocedural laboratory examination: Secondary | ICD-10-CM | POA: Diagnosis not present

## 2020-11-14 LAB — SARS CORONAVIRUS 2 (TAT 6-24 HRS): SARS Coronavirus 2: NEGATIVE

## 2020-11-17 ENCOUNTER — Encounter: Payer: Self-pay | Admitting: Internal Medicine

## 2020-11-17 ENCOUNTER — Other Ambulatory Visit: Payer: Self-pay

## 2020-11-17 ENCOUNTER — Ambulatory Visit: Payer: Medicare HMO | Admitting: Internal Medicine

## 2020-11-17 ENCOUNTER — Ambulatory Visit (INDEPENDENT_AMBULATORY_CARE_PROVIDER_SITE_OTHER): Payer: Medicare HMO | Admitting: Internal Medicine

## 2020-11-17 VITALS — BP 118/78 | HR 93 | Temp 97.0°F | Ht 62.0 in | Wt 206.0 lb

## 2020-11-17 DIAGNOSIS — R0689 Other abnormalities of breathing: Secondary | ICD-10-CM

## 2020-11-17 DIAGNOSIS — R0600 Dyspnea, unspecified: Secondary | ICD-10-CM | POA: Diagnosis not present

## 2020-11-17 DIAGNOSIS — I2781 Cor pulmonale (chronic): Secondary | ICD-10-CM | POA: Diagnosis not present

## 2020-11-17 DIAGNOSIS — R600 Localized edema: Secondary | ICD-10-CM | POA: Diagnosis not present

## 2020-11-17 DIAGNOSIS — G4733 Obstructive sleep apnea (adult) (pediatric): Secondary | ICD-10-CM | POA: Diagnosis not present

## 2020-11-17 DIAGNOSIS — I519 Heart disease, unspecified: Secondary | ICD-10-CM

## 2020-11-17 DIAGNOSIS — J449 Chronic obstructive pulmonary disease, unspecified: Secondary | ICD-10-CM | POA: Diagnosis not present

## 2020-11-17 DIAGNOSIS — R0609 Other forms of dyspnea: Secondary | ICD-10-CM

## 2020-11-17 LAB — PULMONARY FUNCTION TEST
DL/VA % pred: 81 %
DL/VA: 3.19 ml/min/mmHg/L
DLCO cor % pred: 57 %
DLCO cor: 10.65 ml/min/mmHg
DLCO unc % pred: 57 %
DLCO unc: 10.65 ml/min/mmHg
FEF 25-75 Post: 0.39 L/sec
FEF 25-75 Pre: 0.35 L/sec
FEF2575-%Change-Post: 10 %
FEF2575-%Pred-Post: 35 %
FEF2575-%Pred-Pre: 32 %
FEV1-%Change-Post: 3 %
FEV1-%Pred-Post: 51 %
FEV1-%Pred-Pre: 49 %
FEV1-Post: 0.87 L
FEV1-Pre: 0.84 L
FEV1FVC-%Change-Post: 2 %
FEV1FVC-%Pred-Pre: 64 %
FEV6-%Change-Post: 1 %
FEV6-%Pred-Post: 73 %
FEV6-%Pred-Pre: 72 %
FEV6-Post: 1.69 L
FEV6-Pre: 1.67 L
FEV6FVC-%Change-Post: 0 %
FEV6FVC-%Pred-Post: 103 %
FEV6FVC-%Pred-Pre: 103 %
FVC-%Change-Post: 1 %
FVC-%Pred-Post: 71 %
FVC-%Pred-Pre: 70 %
FVC-Post: 1.79 L
FVC-Pre: 1.77 L
Post FEV1/FVC ratio: 49 %
Post FEV6/FVC ratio: 95 %
Pre FEV1/FVC ratio: 47 %
Pre FEV6/FVC Ratio: 95 %

## 2020-11-17 NOTE — Progress Notes (Signed)
Spirometry pre and post and Dlco done today. ?

## 2020-11-17 NOTE — Progress Notes (Signed)
HPI  IOV 05/01/2018  Chief Complaint  Patient presents with  . Consult    Referred by Dr. Radford Pax due to SOB and COPD after diagnosis x2 years ago. Pt is a former [facility] pt last seen 12/27/13. Pt has c/o SOB that happens at anytime especially if he tries to rush and states he has an occ. CP. Denies any cough.    [name] , 87 y.o. , with dob 1930/06/20 and male ,Not Hispanic or Latino from [address] [place] [address] - presents to lung clinic for dyspnea, copd eval. He is 87 years old and referred by Dr. Radford Pax Hisdaughter is here with him. He tells me that he's had insidious onset of shortness of breath for the last several years. He is unable to quantify exactly. Nevertheless in the last few years is progressively worse despite being on Symbicort for the last few years. He has associated sleep apnea and is compliant with CPAP as managed by Dr. Radford Pax. He does have a mild cough but no sputum production. He has obesity but this has been stable. He denies much of smoking but this clear-cut emphysema on visualization of his images and on spirometry 4 years ago. His most recent hemoglobin appears okay.   CAT COPD Symptom & Quality of Life Score (GSK trademark) 0 is no burden. 5 is highest burden 05/01/2018   Never Cough -> Cough all the time 2  No phlegm in chest -> Chest is full of phlegm 2  No chest tightness -> Chest feels very tight 3  No dyspnea for 1 flight stairs/hill -> Very dyspneic for 1 flight of stairs 5  No limitations for ADL at home -> Very limited with ADL at home 3  Confident leaving home -> Not at all confident leaving home 3  Sleep soundly -> Do not sleep soundly because of lung condition 4  Lots of Energy -> No energy at all 3  TOTAL Score (max 40)  25      Spirometry in March 2015 and personally visualized image shows FEV1 0.97 L/45% with a ratio of 50% insistent with Gold stage III obstruction   Imaging that I personally visualized includes April  2019 chest x-ray that shows hyperinflation with possible bullous disease in the lung base. He has CT angiogram of the chest in February 2016 that ruled out pulmonary embolism. I personally visualized this and did not see any mass. He does have emphysema. Pulmonary embolism was ruled out.     Results for RISHIKESH, GORKA (MRN [id number]) as of 05/01/2018 09:14  Ref. Range 01/26/2018 09:56  Hemoglobin Latest Ref Range: 13.0 - 17.7 g/dL 13.8  Results for [name], [name] (MRN [id number]) as of 05/01/2018 09:14  Ref. Range 01/26/2018 09:56  Creatinine Latest Ref Range: 0.76 - 1.27 mg/dL 1.02    OV Aug 1534  87 year old male, former smoker (quit >50 yeas ago). PMH obesity, emphysema, OSA on CPAP. Patient of Dr. Chase Caller, last seen on 7/19.  Referred by Dr. Heron Nay for dyspnea and COPD eval. Reports progression of sob over the last several years despite being on symbicort. Mild cough with no sputum production. Evidence of emphysema on imaging and spirometry. CAT score 25. Spirometry 12/2013- FEV1 0.97, ratio 50% consistent with COPD GOLD III obstruction. Dr. Chase Caller felt dyspnea likely d/t obesity, physical deconditioning and diastolic dysfunction associated COPD. Plan repeat full PFTs to re-stage COPD, HRCT and alpha 1 antitypsin.   Tests: >>HRCT 05/11/18-  IMPRESSION: No findings to suggest interstitial lung disease. Mild diffuse bronchial wall thickening with mild centrilobular and paraseptal emphysema; imaging findings compatible with the reported clinical history of COPD. >>PFTs 05/20/18- FVC 1.63 (60%), FEV1  0.77 (42%), Ratio 48 (65%)   Presents today for follow up dyspnea with his wife. Reports progressive worsening of breathing over the past few years. His main goal is to be able to fish. Continues Symbicort as prescribed. Wearing CPAP every night. Denies cough or wheeze.  Patient had pulmonary function test today showing obstruction, no bronchodilator response. Unable to complete DLCO or lung  volumes d/t patient not being able to perform the proper technique. HRCT completed showing no evidence of ILD.    OV 09/20/2020  Subjective:  Patient ID: [name], male , DOB: April 30, 1930 , age 38 y.o. , MRN: [id number] , ADDRESS: [address] [place] [address] PCP [name], MD Patient Care Team: [name], MD as PCP - General (Family Medicine) Sueanne Margarita, MD as PCP - Cardiology (Cardiology) Sueanne Margarita, MD as Attending Physician (Cardiology)  This Provider for this visit: Treatment Team:  Attending Provider: Brand Males, MD    09/20/2020 -   Chief Complaint  Patient presents with  . Follow-up    Pt last seen 05/20/18. Pt has been having problems with fluid in lungs and also has had increased SOB.  Pt does now have oxygen provided by [employer] that he wears 2L as needed.     HPI [name] 87 y.o. -presents for routine follow-up.  He presents with his daughter.  He has 5 daughters no sons.  One of the daughters is here.  I last saw him 2 and half years ago and at that time got pulmonary function test that showed COPD.  He is followed up with nurse practitioner.  He got started on inhaler therapy.  After that because of the pandemic lost to follow-up.  Daughter tells me he has progressive worsening of shortness of breath.  He gets short of breath just walking from room to room.  Nevertheless COPD CAT score appears the same.  They are more concerned about progressive edema he now has anasarca with chronic lymphedema in the feet.  This is despite diuretics.  Therefore they have been referred to pulmonary to reestablish.  He has a history of diastolic heart failure with the last echocardiogram was in 2017.  He has a history of sleep apnea with some mild hypercarbia as documented below but he does not use a CPAP on a compliant fashion.  He admits that is easily.  His last CT scan of the chest was a few years ago did not show any ILD.  He is recently been  given oxygen by his primary care physician.  Inhalers have been changed around.  Primary care physician notes reviewed.  His most recent hemoglobin in April 2021 was normal at 14.5 g%.  Recent kidney function is stable.  He is quite functional.  He does not use a cane.  He is cognitively intact.     CAT Score 09/20/2020 05/01/2018  Total CAT Score 24 25    Results for [name], [name] (MRN [id number]) as of 09/20/2020 10:51  Ref. Range 07/07/2020 09:48  Creatinine Latest Ref Range: 0.76 - 1.27 mg/dL 1.18   Results for BERWYN, BIGLEY (MRN [id number]) as of 09/20/2020 10:51  Ref. Range 09/06/2018 05:43 04/01/2019 10:25 04/01/2019 13:03 07/14/2019 11:08 07/07/2020 09:48  Hemoglobin Latest Ref Range: 13.0 -  17.0 g/dL 12.7 (L) 15.7       PFT Results Latest Ref Rng & Units 05/20/2018  FVC-Pre L 1.63  FVC-Predicted Pre % 60  FVC-Post L 1.71  FVC-Predicted Post % 63  Pre FEV1/FVC % % 48  Post FEV1/FCV % % 46  FEV1-Pre L 0.77  FEV1-Predicted Pre % 42  FEV1-Post L 0.79   IMPRESSION: 1. No findings to suggest interstitial lung disease. 2. Mild diffuse bronchial wall thickening with mild centrilobular and paraseptal emphysema; imaging findings compatible with the reported clinical history of COPD. 3. Aortic atherosclerosis, in addition to left main and 3 vessel coronary artery disease. 4. There are calcifications of the aortic valve. Echocardiographic correlation for evaluation of potential valvular dysfunction may be warranted if clinically indicated.  Aortic Atherosclerosis (ICD10-I70.0) and Emphysema (ICD10-J43.9).   Electronically Signed   By: Vinnie Langton M.D.   On: 05/11/2018 14:   ROS - per HPI  ECHO dec 2017   -------------------------------------------------------------------  Study Conclusions   - Left ventricle: The cavity size was normal. Wall thickness was  increased in a pattern of mild LVH. Systolic function was normal.  The estimated ejection fraction was  in the range of 60% to 65%.  Wall motion was normal; there were no regional wall motion  abnormalities. Doppler parameters are consistent with abnormal  left ventricular relaxation (grade 1 diastolic dysfunction). The  E/e&' ratio is between 8-15, suggesting indeterminate LV filling  pressure.  - Aortic valve: Poorly visualized. Sclerosis without stenosis.  - Left atrium: The atrium was normal in size.  - Tricuspid valve: There was trivial regurgitation.  - Pulmonary arteries: PA peak pressure: 30 mm Hg (S).  - Inferior vena cava: The vessel was normal in size. The  respirophasic diameter changes were in the normal range (>= 50%),  consistent with normal central venous pressure.   Impressions:   xxxxxxxxxxxxxxxx Results for [name], [name] (MRN [id number]) as of 09/20/2020 10:51  Ref. Range 11/16/2014 22:55  Sample type Unknown ARTERIAL  pH, Arterial Latest Ref Range: 7.35 - 7.45  7.352  pCO2 arterial Latest Ref Range: 35 - 45 mmHg 52.4 (H)  pO2, Arterial Latest Ref Range: 80 - 100 mmHg 67.0 (L)    OV 11/17/2020  Subjective:  Patient ID: [name], male , DOB: Apr 02, 1930 , age 81 y.o. , MRN: [id number] , ADDRESS: [address] [place] [address] PCP [name], MD Patient Care Team: [name], MD as PCP - General (Family Medicine) Sueanne Margarita, MD as PCP - Cardiology (Cardiology) Sueanne Margarita, MD as Attending Physician (Cardiology)  This Provider for this visit: Treatment Team:  Attending Provider: Brand Males, MD    11/17/2020 -   Chief Complaint  Patient presents with  . Follow-up    No complaints     HPI [name] 87 y.o. -returns for follow-up.  At this time he is accompanied by his daughter Vanita Ingles.  The other daughter [name] has had back surgery and could not attend.  Vanita Ingles does not live with him.  He tells me that he is stable.  He could not have a CT scan of the chest because he could not lie flat.  His pedal edema  continues.  There is the only thing that he want symptom relief from.  He has sleep apnea but is not using his CPAP.  He refuses to use a CPAP.  He says his quality of life is fine without the CPAP.  He does  not want a blood gas test done to see if he would qualify for BiPAP.  He had echocardiogram that shows shows diastolic dysfunction but is otherwise normal.  His pulmonary function test shows stability with gold stage III COPD.  He does not use oxygen at daytime or with exertion or at night.  He just is using his triple inhaler therapy.  He prefers basic symptom based approach care    CAT Score 11/17/2020 09/20/2020 05/01/2018  Total CAT Score 9 24 25        PFT  PFT Results Latest Ref Rng & Units 11/17/2020 05/20/2018  FVC-Pre L 1.77 1.63  FVC-Predicted Pre % 70 60  FVC-Post L 1.79 1.71  FVC-Predicted Post % 71 63  Pre FEV1/FVC % % 47 48  Post FEV1/FCV % % 49 46  FEV1-Pre L 0.84 0.77  FEV1-Predicted Pre % 49 42  FEV1-Post L 0.87 0.79  DLCO uncorrected ml/min/mmHg 10.65 -  DLCO UNC% % 57 -  DLCO corrected ml/min/mmHg 10.65 -  DLCO COR %Predicted % 57 -  DLVA Predicted % 81 -       has a past medical history of Anxiety, Asthma, Chronic diastolic CHF (congestive heart failure) ([place]), COPD (chronic obstructive pulmonary disease) ([place]), Coronary artery disease, Diabetes mellitus, Diverticulitis (Oct. 2013), DVT (deep venous thrombosis) ([place]), GERD (gastroesophageal reflux disease), Hypercholesterolemia, Hypertension, Hypothyroidism, Obesity (BMI 30-39.9), OSA (obstructive sleep apnea), Peripheral vascular disease ([place]) (11/2006), Shortness of breath, and Sleep apnea.   reports that he quit smoking about 57 years ago. His smoking use included cigarettes and cigars. He has a 7.50 pack-year smoking history. He has never used smokeless tobacco.  Past Surgical History:  Procedure Laterality Date  . ABDOMINAL AORTAGRAM N/A 04/20/2012   Procedure: ABDOMINAL Maxcine Ham;  Surgeon: Angelia Mould, MD;  [facility] C[facility] LAB;  Service: Cardiovascular;  Laterality: N/A;  . ABDOMINAL AORTAGRAM N/A 12/29/2012   Procedure: ABDOMINAL Maxcine Ham;  Surgeon: Serafina Mitchell, MD;  Location: [facility] CATH LAB;  Service: Cardiovascular;  Laterality: N/A;  . BALLOON DILATION  12/26/2011   Procedure: BALLOON DILATION;  Surgeon: Garlan Fair, MD;  Location: [facility] ENDOSCOPY;  Service: Endoscopy;  Laterality: N/A;  . COLONOSCOPY  08/14/2012   Procedure: COLONOSCOPY;  Surgeon: Arta Silence, MD;  Location: [facility] ENDOSCOPY;  Service: Endoscopy;  Laterality: N/A;  . CORONARY STENT PLACEMENT  2010 and 2012  . ESOPHAGOGASTRODUODENOSCOPY  12/26/2011   Procedure: ESOPHAGOGASTRODUODENOSCOPY (EGD);  Surgeon: Garlan Fair, MD;  Location: [facility] ENDOSCOPY;  Service: Endoscopy;  Laterality: N/A;  . ESOPHAGOGASTRODUODENOSCOPY  08/13/2012   Procedure: ESOPHAGOGASTRODUODENOSCOPY (EGD);  Surgeon: Arta Silence, MD;  Location: [facility] ENDOSCOPY;  Service: Endoscopy;  Laterality: Left;  . HERNIA REPAIR  1992   evntral hernia repair  . LOWER EXTREMITY ANGIOGRAM Bilateral 12/29/2012   Procedure: LOWER EXTREMITY ANGIOGRAM;  Surgeon: Serafina Mitchell, MD;  Location: [facility] CATH LAB;  Service: Cardiovascular;  Laterality: Bilateral;  bilat lower extrem angio    Allergies  Allergen Reactions  . Clopidogrel Itching    Immunization History  Administered Date(s) Administered  . Influenza Split 07/14/2013  . Influenza, High Dose Seasonal PF 06/19/2016, 07/07/2017, 11/05/2018, 08/11/2019  . PFIZER(Purple Top)SARS-COV-2 Vaccination 12/27/2019, 01/19/2020  . Pneumococcal-Unspecified 08/14/2013  . Tdap 01/12/2019    Family History  Problem Relation Age of Onset  . Diabetes Mother   . Hyperlipidemia Mother   . Hypertension Mother   . Heart attack Mother   . Heart disease Mother        before  age 73  . Diabetes Brother   . Heart disease Father        before age 73  . Breast cancer Sister   . Diabetes Sister   . Hyperlipidemia  Sister   . Hypertension Sister   . Heart attack Sister   . Hyperlipidemia Sister   . Hypertension Sister   . Heart disease Sister        Heart Disease before age 105  . Asthma Sister   . Malignant hyperthermia Neg Hx      Current Outpatient Medications:  .  albuterol (PROVENTIL HFA;VENTOLIN HFA) 108 (90 BASE) MCG/ACT inhaler, Inhale 2 puffs into the lungs every 6 (six) hours as needed for wheezing or shortness of breath. , Disp: , Rfl:  .  albuterol (PROVENTIL) (2.5 MG/3ML) 0.083% nebulizer solution, Take 3 mLs (2.5 mg total) by nebulization every 4 (four) hours as needed for wheezing or shortness of breath., Disp: 75 mL, Rfl: 0 .  aspirin EC 81 MG tablet, Take 81 mg by mouth daily., Disp: , Rfl:  .  Budeson-Glycopyrrol-Formoterol (BREZTRI AEROSPHERE) 160-9-4.8 MCG/ACT AERO, Inhale 2 puffs into the lungs in the morning and at bedtime., Disp: , Rfl:  .  budesonide (PULMICORT) 0.5 MG/2ML nebulizer solution, Take 0.5 mg by nebulization 2 (two) times daily., Disp: , Rfl:  .  ezetimibe (ZETIA) 10 MG tablet, Take 1 tablet by mouth once daily, Disp: 60 tablet, Rfl: 1 .  furosemide (LASIX) 40 MG tablet, Take 40 mg by mouth daily as needed. , Disp: , Rfl:  .  glimepiride (AMARYL) 2 MG tablet, Take 2 mg by mouth every evening. , Disp: , Rfl:  .  ipratropium-albuterol (DUONEB) 0.5-2.5 (3) MG/3ML SOLN, Take 3 mLs by nebulization in the morning, at noon, in the evening, and at bedtime., Disp: , Rfl:  .  levothyroxine (SYNTHROID, LEVOTHROID) 150 MCG tablet, Take 150 mcg by mouth every evening. , Disp: , Rfl:  .  meloxicam (MOBIC) 15 MG tablet, Take 15 mg by mouth daily as needed. , Disp: , Rfl:  .  metoprolol succinate (TOPROL-XL) 50 MG 24 hr tablet, Take 1 tablet by mouth once daily, Disp: 60 tablet, Rfl: 1 .  nitroGLYCERIN (NITROSTAT) 0.4 MG SL tablet, Place 1 tablet (0.4 mg total) under the tongue every 5 (five) minutes as needed for chest pain. (Patient not taking: Reported on 09/20/2020), Disp: 25  tablet, Rfl: 6 .  rosuvastatin (CRESTOR) 20 MG tablet, Take 1 tablet (20 mg total) by mouth daily., Disp: 90 tablet, Rfl: 3      Objective:   Vitals:   11/17/20 1355  BP: 118/78  Pulse: 93  Temp: (!) 97 F (36.1 C)  SpO2: 90%  Weight: 206 lb (93.4 kg)  Height: 5\' 2"  (1.575 m)    Estimated body mass index is 37.68 kg/m as calculated from the following:   Height as of this encounter: 5\' 2"  (1.575 m).   Weight as of this encounter: 206 lb (93.4 kg).  @WEIGHTCHANGE @  [employer]   11/17/20 1355  Weight: 206 lb (93.4 kg)     Physical Exam General: No distress. obese Neuro: Alert and Oriented x 3. GCS 15. Speech normal Psych: Pleasant Resp:  Barrel Chest - n.  Wheeze - no, Crackles - no, No overt respiratory distress CVS: Normal heart sounds. Murmurs - no Ext: Stigmata of Connective Tissue Disease - no but has significant chronic venous stasis edema HEENT: Normal upper airway. PEERL +. No post nasal drip  Assessment:       ICD-10-CM   1. Dyspnea on exertion  R06.00   2. Stage 3 severe COPD by GOLD classification ([place])  J44.9   3. Cor pulmonale, chronic (HCC)  I27.81   4. Diastolic dysfunction, left ventricle  I51.9   5. OSA (obstructive sleep apnea)  G47.33   6. Hypercarbia  R06.89   7. Edema of extremities  R60.0        Plan:     Patient Instructions     ICD-10-CM   1. Dyspnea on exertion  R06.00   2. Stage 3 severe COPD by GOLD classification ([place])  J44.9   3. Cor pulmonale, chronic (HCC)  I27.81   4. Diastolic dysfunction, left ventricle  I51.9   5. OSA (obstructive sleep apnea)  G47.33   6. Hypercarbia  R06.89      Overall you are stable since last visit  Your sleep apnea and then severe COPD and both of this over a long period of time has caused stress on the right side of the heart adding to shortness of breath, carbon dioxide retention and also leg swelling   We could not assess for additional problems such as pulmonary fibrosis or  lung cancer because he could not do a CT scan of the chest [could not lie flat]  Respect the fact that you do not want any other additional investigations  Respect the fact that you wish to not use CPAP for your sleep apnea   Respect the fact you are not wish to get blood gas tested to see if you would qualify for BiPAP  Plan -Consider restarting on nighttime CPAP but respected if you will not -Use BREZTRI daily inhaler -Use albuterol as needed -Talk to primary care physician about leg swelling management that includes potentially Lasix, compression stockings and leg elevation and make sure you do not let it get infected  Follow-up 6-9 months or sooner if needed     SIGNATURE    Dr. Brand Males, M.D., F.C.C.P,  Pulmonary and Critical Care Medicine Staff Physician, [place] Director - Interstitial Lung Disease  Program  Pulmonary [place] [facility] [place], [place], [address]  Pager: [phone], If no answer or between  15:00h - 7:00h: call [phone] Telephone: [phone]  2:30 PM 11/17/2020

## 2020-11-17 NOTE — Patient Instructions (Addendum)
ICD-10-CM   1. Dyspnea on exertion  R06.00   2. Stage 3 severe COPD by GOLD classification ([place])  J44.9   3. Cor pulmonale, chronic (HCC)  I27.81   4. Diastolic dysfunction, left ventricle  I51.9   5. OSA (obstructive sleep apnea)  G47.33   6. Hypercarbia  R06.89      Overall you are stable since last visit  Your sleep apnea and then severe COPD and both of this over a long period of time has caused stress on the right side of the heart adding to shortness of breath, carbon dioxide retention and also leg swelling   We could not assess for additional problems such as pulmonary fibrosis or lung cancer because he could not do a CT scan of the chest [could not lie flat]  Respect the fact that you do not want any other additional investigations  Respect the fact that you wish to not use CPAP for your sleep apnea   Respect the fact you are not wish to get blood gas tested to see if you would qualify for BiPAP  Plan -Consider restarting on nighttime CPAP but respected if you will not -Use BREZTRI daily inhaler -Use albuterol as needed -Talk to primary care physician about leg swelling management that includes potentially Lasix, compression stockings and leg elevation and make sure you do not let it get infected  Follow-up 6-9 months or sooner if needed

## 2020-11-21 DIAGNOSIS — R609 Edema, unspecified: Secondary | ICD-10-CM | POA: Diagnosis not present

## 2020-11-21 DIAGNOSIS — I509 Heart failure, unspecified: Secondary | ICD-10-CM | POA: Diagnosis not present

## 2020-11-21 DIAGNOSIS — L02419 Cutaneous abscess of limb, unspecified: Secondary | ICD-10-CM | POA: Diagnosis not present

## 2020-11-30 ENCOUNTER — Other Ambulatory Visit: Payer: Self-pay

## 2020-11-30 ENCOUNTER — Ambulatory Visit (HOSPITAL_COMMUNITY)
Admission: RE | Admit: 2020-11-30 | Discharge: 2020-11-30 | Disposition: A | Discharge status: Home / Self Care | Payer: Medicare HMO | Source: Ambulatory Visit | Attending: Internal Medicine | Admitting: Internal Medicine

## 2020-11-30 ENCOUNTER — Encounter (HOSPITAL_COMMUNITY): Payer: Self-pay | Admitting: Internal Medicine

## 2020-11-30 VITALS — BP 110/70 | HR 69 | Wt 198.4 lb

## 2020-11-30 DIAGNOSIS — R0609 Other forms of dyspnea: Secondary | ICD-10-CM

## 2020-11-30 DIAGNOSIS — R600 Localized edema: Secondary | ICD-10-CM

## 2020-11-30 DIAGNOSIS — I998 Other disorder of circulatory system: Secondary | ICD-10-CM

## 2020-11-30 DIAGNOSIS — Z8672 Personal history of thrombophlebitis: Secondary | ICD-10-CM | POA: Diagnosis not present

## 2020-11-30 DIAGNOSIS — I2510 Atherosclerotic heart disease of native coronary artery without angina pectoris: Secondary | ICD-10-CM

## 2020-11-30 DIAGNOSIS — R0600 Dyspnea, unspecified: Secondary | ICD-10-CM

## 2020-11-30 NOTE — Patient Instructions (Signed)
Your physician has requested that you have a lower or upper extremity venous duplex. This test is an ultrasound of the veins in the legs or arms. It looks at venous blood flow that carries blood from the heart to the legs or arms. Allow one hour for a Lower Venous exam. Allow thirty minutes for an Upper Venous exam. There are no restrictions or special instructions.  Please wear your compression hose daily, place them on as soon as you get up in the morning and remove before you go to bed at night.  You have been referred to [facility] physician recommends that you schedule a follow-up appointment in: 1 month  If you have any questions or concerns before your next appointment please send [place] a message through [place] or call our office at [phone].    TO LEAVE A MESSAGE FOR THE NURSE SELECT OPTION 2, PLEASE LEAVE A MESSAGE INCLUDING: . YOUR NAME . DATE OF BIRTH . CALL BACK NUMBER . REASON FOR CALL**this is important as we prioritize the call backs  [place] AS LONG AS YOU CALL BEFORE 4:00 PM  At the [facility], you and your health needs are our priority. As part of our continuing mission to provide you with exceptional heart care, we have created designated Provider Care Teams. These Care Teams include your primary Cardiologist (physician) and Advanced Practice Providers (APPs- Physician Assistants and Nurse Practitioners) who all work together to provide you with the care you need, when you need it.   You may see any of the following providers on your designated Care Team at your next follow up: Marland Kitchen Dr Glori Bickers . Dr Loralie Champagne . Darrick Grinder, NP . Lyda Jester, [place] . Audry Riles, PharmD   Please be sure to bring in all your medications bottles to every appointment.

## 2020-11-30 NOTE — Progress Notes (Signed)
Advanced Heart Failure Clinic Note   Referring Physician: PCP: Eddie Neer, MD PCP-Cardiologist: Eddie Him, MD   HPI:  [name], [name] is an 87 yo male with HFpEF, COPD has PRN oxygen at home for exertion, DM2, HTN, HLD, CAD s/p DES x4, hypothyroidism, PAD s/p LLE stent, severe OSA, diverticulitis w/ bleeding, GERD. Referred by [name] for further evaluation of LE edema.   Followed by [name] for COPD.  PFTs 05/20/18- FVC 1.63 (60%), FEV1  0.77 (42%), Ratio 48 (65%)  HRCT 05/11/18 No findings to suggest ILD. Mild diffuse bronchial wall thickening with mild centrilobular and paraseptal emphysema.   Had PFT's 11/2020 with stable severe irway obstruction.  Had repeat ECHO on 09/2020 with LVEF 55-60%, G1DD, no valvular pathology, Normal RV function and pressure, No TR  Personally reviewed  He is here with his daughter, says he's had some increased leg swelling and maybe some increase in shortness of breath but main complaint is leg swelling L>R. Not weighing himself at home.  He doesn't walk very far due to arthitic pain and leg weakness.  He can walk from room to room doesn't walk much further.  Denies any chest pain.  + 2 pillow orthopnea.  Denies PND.  Has OSA but not on CPAP due to water backflow scaring Simon afraid that he will have it happen while he is sleeping.  About a week ago, PCP increased his lasix from 40mg  BID to 80mg  BID. He has noticed increase in his urination.  His weight is down to 198lbs today from 206lbs at [name] office on 11/17/20    Review of Systems: [y] = yes, [ ]  = no   General: Weight gain [ ] ; Weight loss Blue.Reese ]; Anorexia [ ] ; Fatigue Blue.Reese ]; Fever [ ] ; Chills [ ] ; Weakness [ ]   Cardiac: Chest pain/pressure [ ] ; Resting SOB Blue.Reese ]; Exertional SOB Blue.Reese ]; Orthopnea Blue.Reese ]; Pedal Edema Blue.Reese ]; Palpitations [ ] ; Syncope [ ] ; Presyncope [ ] ; Paroxysmal nocturnal dyspnea[ ]   Pulmonary: Cough [ ] ; Wheezing[ ] ; Hemoptysis[ ] ; Sputum [ ] ; Snoring [ ]   GI: Vomiting[ ] ;  Dysphagia[ ] ; Melena[ ] ; Hematochezia [ ] ; Heartburn[ ] ; Abdominal pain [ ] ; Constipation [ ] ; Diarrhea [ ] ; BRBPR [ ]   GU: Hematuria[ ] ; Dysuria [ ] ; Nocturia[ ]   Vascular: Pain in legs with walking Blue.Reese ]; Pain in feet with lying flat [ ] ; Non-healing sores [ ] ; Stroke [ ] ; TIA [ ] ; Slurred speech [ ] ;  Neuro: Headaches[ ] ; Vertigo[ ] ; Seizures[ ] ; Paresthesias[ ] ;Blurred vision [ ] ; Diplopia [ ] ; Vision changes [ ]   Ortho/Skin: Arthritis Blue.Reese ]; Joint pain Blue.Reese ]; Muscle pain [ ] ; Joint swelling [ ] ; Back Pain [ ] ; Rash [ ]   Psych: Depression[ ] ; Anxiety[y ]  Heme: Bleeding problems [ ] ; Clotting disorders [ ] ; Anemia [ ]   Endocrine: Diabetes Blue.Reese ]; Thyroid dysfunction[ ]    Past Medical History:  Diagnosis Date  . Anxiety   . Asthma   . Chronic diastolic CHF (congestive heart failure) (HCC)    diastolic   . COPD (chronic obstructive pulmonary disease) ([place])   . Coronary artery disease    s/p PCI of RCA 03/2009 at which time there was 70% ramus and 90% RCA, cath 01/2011 showed patent stents in RCA with moderate prox disesae, aneurysmal left circ and small 90% ramus s/p PCI, patent LAD EF 55%  . Diabetes mellitus   . Diverticulitis Oct. 2013   bleeding in the  past and Effient for his CAD was stopped  . DVT (deep venous thrombosis) ([place])   . GERD (gastroesophageal reflux disease)   . Hypercholesterolemia   . Hypertension   . Hypothyroidism   . Obesity (BMI 30-39.9)   . OSA (obstructive sleep apnea)    severe on CPAP  . Peripheral vascular disease ([place]) 11/2006   s/p left stent  . Shortness of breath   . Sleep apnea    severe OSA awaiting CPAP titration    Current Outpatient Medications  Medication Sig Dispense Refill  . albuterol (PROVENTIL HFA;VENTOLIN HFA) 108 (90 BASE) MCG/ACT inhaler Inhale 2 puffs into the lungs every 6 (six) hours as needed for wheezing or shortness of breath.     Marland Kitchen albuterol (PROVENTIL) (2.5 MG/3ML) 0.083% nebulizer solution Take 3 mLs (2.5 mg total) by  nebulization every 4 (four) hours as needed for wheezing or shortness of breath. 75 mL 0  . aspirin EC 81 MG tablet Take 81 mg by mouth daily.    . Budeson-Glycopyrrol-Formoterol (BREZTRI AEROSPHERE) 160-9-4.8 MCG/ACT AERO Inhale 2 puffs into the lungs in the morning and at bedtime.    . budesonide (PULMICORT) 0.5 MG/2ML nebulizer solution Take 0.5 mg by nebulization 2 (two) times daily.    Marland Kitchen ezetimibe (ZETIA) 10 MG tablet Take 1 tablet by mouth once daily 60 tablet 1  . furosemide (LASIX) 40 MG tablet Take 80 mg by mouth 2 (two) times daily.    Marland Kitchen glimepiride (AMARYL) 2 MG tablet Take 2 mg by mouth every evening.     Marland Kitchen ipratropium-albuterol (DUONEB) 0.5-2.5 (3) MG/3ML SOLN Take 3 mLs by nebulization in the morning, at noon, in the evening, and at bedtime.    Marland Kitchen levothyroxine (SYNTHROID, LEVOTHROID) 150 MCG tablet Take 150 mcg by mouth every evening.     . meloxicam (MOBIC) 15 MG tablet Take 15 mg by mouth daily as needed.     . metoprolol succinate (TOPROL-XL) 50 MG 24 hr tablet Take 1 tablet by mouth once daily 60 tablet 1  . nitroGLYCERIN (NITROSTAT) 0.4 MG SL tablet Place 1 tablet (0.4 mg total) under the tongue every 5 (five) minutes as needed for chest pain. 25 tablet 6  . rosuvastatin (CRESTOR) 20 MG tablet Take 1 tablet (20 mg total) by mouth daily. 90 tablet 3   No current facility-administered medications for this encounter.    Allergies  Allergen Reactions  . Clopidogrel Itching      Social History   Socioeconomic History  . Marital status: Widowed    Spouse name: Not on file  . Number of children: Not on file  . Years of education: Not on file  . Highest education level: Not on file  Occupational History  . Not on file  Tobacco Use  . Smoking status: Former Smoker    Packs/day: 0.50    Years: 15.00    Pack years: 7.50    Types: Cigarettes, Cigars    Quit date: 10/15/1963    Years since quitting: 57.1  . Smokeless tobacco: Never Used  Vaping Use  . Vaping Use: Never  used  Substance and Sexual Activity  . Alcohol use: No  . Drug use: No  . Sexual activity: Never  Other Topics Concern  . Not on file  Social History Narrative  . Not on file   Social Determinants of Health   Financial Resource Strain: Not on file  Food Insecurity: Not on file  Transportation Needs: Not on file  Physical Activity:  Not on file  Stress: Not on file  Social Connections: Not on file  Intimate Partner Violence: Not on file      Family History  Problem Relation Age of Onset  . Diabetes Mother   . Hyperlipidemia Mother   . Hypertension Mother   . Heart attack Mother   . Heart disease Mother        before age 17  . Diabetes Brother   . Heart disease Father        before age 84  . Breast cancer Sister   . Diabetes Sister   . Hyperlipidemia Sister   . Hypertension Sister   . Heart attack Sister   . Hyperlipidemia Sister   . Hypertension Sister   . Heart disease Sister        Heart Disease before age 23  . Asthma Sister   . Malignant hyperthermia Neg Hx     Vitals:   11/30/20 0938  BP: 110/70  Simon: 71  SpO2: 93%  Weight: 90 kg (198 lb 6.4 oz)     PHYSICAL EXAM: General:  Elderly male but looks younger than stated age. No respiratory difficulty HEENT: normal Neck: supple. no JVD. Carotids 2+ bilat; no bruits. No lymphadenopathy or thyromegaly appreciated. Cor: PMI nondisplaced. Regular rate & rhythm. 2/6 SEM Lungs: clear with decreased breath sounds throughout Abdomen: Obese, nontender, nondistended. No hepatosplenomegaly. No bruits or masses. Good bowel sounds. Extremities: no cyanosis, clubbing, rash, Bilateral LE edema 3+ L significantly 2+R and venous stasis dermatitis bilaterally Neuro: alert & oriented x 3, cranial nerves grossly intact. moves all 4 extremities w/o difficulty. Affect pleasant.   ASSESSMENT & PLAN:  1. LE edema - REDS lung water is normal 27% suggest this is not likely predominant left heart failure currently though his  lung water may have been high prior to recent diuresis - Given severe COPD is at risk for PAH and RV failure but echo not suggestive of this - Given assymmetric edema and presence of LE varicosities suspect this is primarily venous insufficiency -Encouraged compression hose today -Check venous duplex to exclude DVT -Referral to vascular surgery for venous insufficiency treatment and evaluation - Continue lasix 80 bid for now and follow renal function. Suspect we may have to decrease to 80/40 or switch to torsemide  2. COPD, severe -likely main source of his dyspnea along with deconditioning -reviewed PFT's which were stable obstructive disease, no DLCO values available -Followed by [name] -continue triple therapy inhalers and nebs  -we walked Simon around the office today SPO2 dropped to 90% but not below, he did not appear short of breath, heart rate remained normal, he had to stop due to MSK pain.    3. OSA not on CPAP: -would continue encouraging the patient to use, perhaps can switch to nasal mask only  4. CAD s/p DES x4: -no anginal symptoms, continue asa, zetia, crestor  5. PAD: -no s/s claudication. Previously followed by [facility]. Will refer back   Glori Bickers, MD  10:58 PM

## 2020-12-04 ENCOUNTER — Other Ambulatory Visit: Payer: Self-pay

## 2020-12-04 ENCOUNTER — Ambulatory Visit (HOSPITAL_COMMUNITY)
Admission: RE | Admit: 2020-12-04 | Discharge: 2020-12-04 | Disposition: A | Discharge status: Home / Self Care | Payer: Medicare HMO | Source: Ambulatory Visit | Attending: Internal Medicine | Admitting: Internal Medicine

## 2020-12-04 DIAGNOSIS — Z8672 Personal history of thrombophlebitis: Secondary | ICD-10-CM | POA: Insufficient documentation

## 2020-12-04 NOTE — Progress Notes (Signed)
Bilateral lower extremity venous study completed.      Please see CV Proc for preliminary results.   Quintella Mura, RVT  

## 2020-12-07 DIAGNOSIS — J441 Chronic obstructive pulmonary disease with (acute) exacerbation: Secondary | ICD-10-CM | POA: Diagnosis not present

## 2020-12-25 ENCOUNTER — Telehealth (HOSPITAL_COMMUNITY): Payer: Self-pay | Admitting: Cardiology

## 2020-12-25 NOTE — Telephone Encounter (Signed)
Received a fax requesting medical records from [facility] . Records were successfully faxed to: [phone] attn: Mardene Celeste ,which was the number provided.. Medical request form will be scanned into patients chart.

## 2020-12-27 NOTE — Progress Notes (Signed)
Advanced Heart Failure Clinic Note   Referring Physician: PCP: [name], MD PCP-Cardiologist: Fransico Him, MD   HPI:  [name], [name] is an 87 yo male with HFpEF, COPD has PRN oxygen at home for exertion, DM2, HTN, HLD, CAD s/p DES x4, hypothyroidism, PAD s/p LLE stent, severe OSA, diverticulitis w/ bleeding, GERD. Referred by Dr. Brigitte Pulse for further evaluation of LE edema.   Followed by Dr. Chase Caller for COPD.  PFTs 05/20/18- FVC 1.63 (60%), FEV1  0.77 (42%), Ratio 48 (65%)  HRCT 05/11/18 No findings to suggest ILD. Mild diffuse bronchial wall thickening with mild centrilobular and paraseptal emphysema.   Had PFT's 11/2020 with stable severe irway obstruction.  Had repeat ECHO on 09/2020 with LVEF 55-60%, G1DD, no valvular pathology, Normal RV function and pressure, No TR  Personally reviewed  Seen in 2/22 for first visit due to LE edema. Felt to be most likely related to venous insufficiency. U/s negative for DVT. Referred to [facility].   Here for f/u with his daughter. Still with ankle swelling. No orthopnea or PND    Past Medical History:  Diagnosis Date  . Anxiety   . Asthma   . Chronic diastolic CHF (congestive heart failure) (HCC)    diastolic   . COPD (chronic obstructive pulmonary disease) ([place])   . Coronary artery disease    s/p PCI of RCA 03/2009 at which time there was 70% ramus and 90% RCA, cath 01/2011 showed patent stents in RCA with moderate prox disesae, aneurysmal left circ and small 90% ramus s/p PCI, patent LAD EF 55%  . Diabetes mellitus   . Diverticulitis Oct. 2013   bleeding in the past and Effient for his CAD was stopped  . DVT (deep venous thrombosis) ([place])   . GERD (gastroesophageal reflux disease)   . Hypercholesterolemia   . Hypertension   . Hypothyroidism   . Obesity (BMI 30-39.9)   . OSA (obstructive sleep apnea)    severe on CPAP  . Peripheral vascular disease ([place]) 11/2006   s/p left stent  . Shortness of breath   . Sleep apnea    severe OSA awaiting  CPAP titration    Current Outpatient Medications  Medication Sig Dispense Refill  . albuterol (PROVENTIL HFA;VENTOLIN HFA) 108 (90 BASE) MCG/ACT inhaler Inhale 2 puffs into the lungs every 6 (six) hours as needed for wheezing or shortness of breath.     Marland Kitchen albuterol (PROVENTIL) (2.5 MG/3ML) 0.083% nebulizer solution Take 3 mLs (2.5 mg total) by nebulization every 4 (four) hours as needed for wheezing or shortness of breath. 75 mL 0  . aspirin EC 81 MG tablet Take 81 mg by mouth daily.    . Budeson-Glycopyrrol-Formoterol (BREZTRI AEROSPHERE) 160-9-4.8 MCG/ACT AERO Inhale 2 puffs into the lungs in the morning and at bedtime.    . budesonide (PULMICORT) 0.5 MG/2ML nebulizer solution Take 0.5 mg by nebulization 2 (two) times daily.    Marland Kitchen ezetimibe (ZETIA) 10 MG tablet Take 1 tablet by mouth once daily 60 tablet 1  . furosemide (LASIX) 40 MG tablet Take 80 mg by mouth 2 (two) times daily.    Marland Kitchen glimepiride (AMARYL) 2 MG tablet Take 2 mg by mouth every evening.     Marland Kitchen ipratropium-albuterol (DUONEB) 0.5-2.5 (3) MG/3ML SOLN Take 3 mLs by nebulization in the morning, at noon, in the evening, and at bedtime.    Marland Kitchen levothyroxine (SYNTHROID, LEVOTHROID) 150 MCG tablet Take 150 mcg by mouth every evening.     . meloxicam (MOBIC) 15  MG tablet Take 15 mg by mouth daily as needed.     . metoprolol succinate (TOPROL-XL) 50 MG 24 hr tablet Take 1 tablet by mouth once daily 60 tablet 1  . nitroGLYCERIN (NITROSTAT) 0.4 MG SL tablet Place 1 tablet (0.4 mg total) under the tongue every 5 (five) minutes as needed for chest pain. 25 tablet 6  . rosuvastatin (CRESTOR) 20 MG tablet Take 1 tablet (20 mg total) by mouth daily. 90 tablet 3   No current facility-administered medications for this encounter.    Allergies  Allergen Reactions  . Clopidogrel Itching      Social History   Socioeconomic History  . Marital status: Widowed    Spouse name: Not on file  . Number of children: Not on file  . Years of education:  Not on file  . Highest education level: Not on file  Occupational History  . Not on file  Tobacco Use  . Smoking status: Former Smoker    Packs/day: 0.50    Years: 15.00    Pack years: 7.50    Types: Cigarettes, Cigars    Quit date: 10/15/1963    Years since quitting: 57.2  . Smokeless tobacco: Never Used  Vaping Use  . Vaping Use: Never used  Substance and Sexual Activity  . Alcohol use: No  . Drug use: No  . Sexual activity: Never  Other Topics Concern  . Not on file  Social History Narrative  . Not on file   Social Determinants of Health   Financial Resource Strain: Not on file  Food Insecurity: Not on file  Transportation Needs: Not on file  Physical Activity: Not on file  Stress: Not on file  Social Connections: Not on file  Intimate Partner Violence: Not on file      Family History  Problem Relation Age of Onset  . Diabetes Mother   . Hyperlipidemia Mother   . Hypertension Mother   . Heart attack Mother   . Heart disease Mother        before age 12  . Diabetes Brother   . Heart disease Father        before age 71  . Breast cancer Sister   . Diabetes Sister   . Hyperlipidemia Sister   . Hypertension Sister   . Heart attack Sister   . Hyperlipidemia Sister   . Hypertension Sister   . Heart disease Sister        Heart Disease before age 63  . Asthma Sister   . Malignant hyperthermia Neg Hx     Vitals:   12/28/20 1039  BP: 140/70  Pulse: 72  SpO2: 94%  Weight: 92.5 kg (204 lb)   Wt Readings from Last 3 Encounters:  12/28/20 92.5 kg (204 lb)  11/30/20 90 kg (198 lb 6.4 oz)  11/17/20 93.4 kg (206 lb)      PHYSICAL EXAM: General:  Elderly male but looks younger than stated age. Mild wheeze  HEENT: normal Neck: supple. JVP 7-8 Carotids 2+ bilat; no bruits. No lymphadenopathy or thryomegaly appreciated. Cor: PMI nondisplaced. Regular rate & rhythm. No rubs, gallops or murmurs. Lungs: mild with mild wheeze Abdomen: obese soft, nontender,  nondistended. No hepatosplenomegaly. No bruits or masses. Good bowel sounds. Extremities: no cyanosis, clubbing, rash,  2+ LE edema L>R chronic venous stasis changes Neuro: alert & orientedx3, cranial nerves grossly intact. moves all 4 extremities w/o difficulty. Affect pleasant    ASSESSMENT & PLAN:  1. LE edema -  REDS lung water is normal 27% suggest this is not likely predominant left heart failure currently though his lung water may have been high prior to recent diuresis - Given severe COPD is at risk for PAH and RV failure but echo not suggestive of this - Given assymmetric edema and presence of LE varicosities suspect this is primarily venous insufficienc but may also have a component of HF. - Weight up 6 pounds. Change lasix to torsemide 40 daily - Wrap legs - Await [facility] input - If no better will schedule RHC at next visit  2. COPD, severe -likely main source of his dyspnea along with deconditioning -reviewed PFT's which were stable obstructive disease, no DLCO values available -Followed by Dr. Chase Caller -continue triple therapy inhalers and nebs  -No desats < 90% on hall walk   3. OSA not on CPAP: -would continue encouraging the patient to use, perhaps can switch to nasal mask only  4. CAD s/p DES x4: -no s/s angina, continue asa, zetia, crestor  5. PAD: -no s/s claudication. Previously followed by [facility]. Will refer back   Glori Bickers, MD  11:19 AM

## 2020-12-28 ENCOUNTER — Ambulatory Visit (HOSPITAL_COMMUNITY)
Admission: RE | Admit: 2020-12-28 | Discharge: 2020-12-28 | Disposition: A | Discharge status: Home / Self Care | Payer: Medicare HMO | Source: Ambulatory Visit | Attending: Internal Medicine | Admitting: Internal Medicine

## 2020-12-28 ENCOUNTER — Other Ambulatory Visit: Payer: Self-pay

## 2020-12-28 ENCOUNTER — Encounter (HOSPITAL_COMMUNITY): Payer: Self-pay | Admitting: Internal Medicine

## 2020-12-28 VITALS — BP 140/70 | HR 72 | Wt 204.0 lb

## 2020-12-28 DIAGNOSIS — I5032 Chronic diastolic (congestive) heart failure: Secondary | ICD-10-CM | POA: Insufficient documentation

## 2020-12-28 DIAGNOSIS — Z7984 Long term (current) use of oral hypoglycemic drugs: Secondary | ICD-10-CM | POA: Diagnosis not present

## 2020-12-28 DIAGNOSIS — J449 Chronic obstructive pulmonary disease, unspecified: Secondary | ICD-10-CM | POA: Insufficient documentation

## 2020-12-28 DIAGNOSIS — I739 Peripheral vascular disease, unspecified: Secondary | ICD-10-CM | POA: Insufficient documentation

## 2020-12-28 DIAGNOSIS — Z79899 Other long term (current) drug therapy: Secondary | ICD-10-CM | POA: Diagnosis not present

## 2020-12-28 DIAGNOSIS — I998 Other disorder of circulatory system: Secondary | ICD-10-CM | POA: Diagnosis not present

## 2020-12-28 DIAGNOSIS — R600 Localized edema: Secondary | ICD-10-CM | POA: Diagnosis not present

## 2020-12-28 DIAGNOSIS — I110 Hypertensive heart disease with heart failure: Secondary | ICD-10-CM | POA: Insufficient documentation

## 2020-12-28 DIAGNOSIS — G4733 Obstructive sleep apnea (adult) (pediatric): Secondary | ICD-10-CM | POA: Diagnosis not present

## 2020-12-28 DIAGNOSIS — I2510 Atherosclerotic heart disease of native coronary artery without angina pectoris: Secondary | ICD-10-CM | POA: Insufficient documentation

## 2020-12-28 DIAGNOSIS — Z7982 Long term (current) use of aspirin: Secondary | ICD-10-CM | POA: Insufficient documentation

## 2020-12-28 DIAGNOSIS — Z87891 Personal history of nicotine dependence: Secondary | ICD-10-CM | POA: Diagnosis not present

## 2020-12-28 DIAGNOSIS — Z955 Presence of coronary angioplasty implant and graft: Secondary | ICD-10-CM | POA: Diagnosis not present

## 2020-12-28 DIAGNOSIS — Z8249 Family history of ischemic heart disease and other diseases of the circulatory system: Secondary | ICD-10-CM | POA: Insufficient documentation

## 2020-12-28 LAB — BASIC METABOLIC PANEL
Anion gap: 5 (ref 5–15)
BUN: 6 mg/dL — ABNORMAL LOW (ref 8–23)
CO2: 31 mmol/L (ref 22–32)
Calcium: 9.1 mg/dL (ref 8.9–10.3)
Chloride: 104 mmol/L (ref 98–111)
Creatinine, Ser: 0.99 mg/dL (ref 0.61–1.24)
GFR, Estimated: >60 mL/min (ref >60)
Glucose, Bld: 125 mg/dL — ABNORMAL HIGH (ref 70–99)
Potassium: 3.8 mmol/L (ref 3.5–5.1)
Sodium: 140 mmol/L (ref 135–145)

## 2020-12-28 LAB — BRAIN NATRIURETIC PEPTIDE: B Natriuretic Peptide: 73.4 pg/mL (ref 0.0–100.0)

## 2020-12-28 MED ORDER — TORSEMIDE 20 MG PO TABS: 40.0000 mg | ORAL_TABLET | Freq: Every day | ORAL | 3 refills | Status: DC

## 2020-12-28 NOTE — Patient Instructions (Signed)
Stop Furosemide  Start Torsemide 40 mg (2 tabs) Daily  Labs done today, we will call you for abnormal results  Please wrap legs with ace bandages to help with edema  Your physician recommends that you schedule a follow-up appointment in: 2 months  If you have any questions or concerns before your next appointment please send [place] a message through [place] or call our office at [phone].    TO LEAVE A MESSAGE FOR THE NURSE SELECT OPTION 2, PLEASE LEAVE A MESSAGE INCLUDING: . YOUR NAME . DATE OF BIRTH . CALL BACK NUMBER . REASON FOR CALL**this is important as we prioritize the call backs  [place] AS LONG AS YOU CALL BEFORE 4:00 PM  At the [facility], you and your health needs are our priority. As part of our continuing mission to provide you with exceptional heart care, we have created designated Provider Care Teams. These Care Teams include your primary Cardiologist (physician) and Advanced Practice Providers (APPs- Physician Assistants and Nurse Practitioners) who all work together to provide you with the care you need, when you need it.   You may see any of the following providers on your designated Care Team at your next follow up: Marland Kitchen Dr Glori Bickers . Dr Loralie Champagne . Dr Vickki Muff . Darrick Grinder, NP . Lyda Jester, [place] . Audry Riles, PharmD   Please be sure to bring in all your medications bottles to every appointment.

## 2020-12-28 NOTE — Addendum Note (Signed)
Encounter addended by: Scarlette Calico, RN on: 12/28/2020 11:32 AM  Actions taken: Visit diagnoses modified, Diagnosis association updated, Pharmacy for encounter modified, Order list changed, Clinical Note Signed, Charge Capture section accepted

## 2021-01-04 DIAGNOSIS — J441 Chronic obstructive pulmonary disease with (acute) exacerbation: Secondary | ICD-10-CM | POA: Diagnosis not present

## 2021-01-08 ENCOUNTER — Other Ambulatory Visit: Payer: Self-pay | Admitting: *Deleted

## 2021-01-08 DIAGNOSIS — Z48812 Encounter for surgical aftercare following surgery on the circulatory system: Secondary | ICD-10-CM

## 2021-01-08 DIAGNOSIS — R600 Localized edema: Secondary | ICD-10-CM

## 2021-01-08 DIAGNOSIS — I714 Abdominal aortic aneurysm, without rupture, unspecified: Secondary | ICD-10-CM

## 2021-01-17 ENCOUNTER — Ambulatory Visit (HOSPITAL_COMMUNITY)
Admission: RE | Admit: 2021-01-17 | Discharge: 2021-01-17 | Disposition: A | Discharge status: Home / Self Care | Payer: Medicare HMO | Source: Ambulatory Visit | Attending: Vascular Surgery | Admitting: Vascular Surgery

## 2021-01-17 ENCOUNTER — Encounter: Payer: Self-pay | Admitting: Vascular Surgery

## 2021-01-17 ENCOUNTER — Other Ambulatory Visit: Payer: Self-pay

## 2021-01-17 ENCOUNTER — Ambulatory Visit: Payer: Medicare HMO | Admitting: Vascular Surgery

## 2021-01-17 ENCOUNTER — Ambulatory Visit (HOSPITAL_COMMUNITY): Payer: Medicare HMO

## 2021-01-17 ENCOUNTER — Ambulatory Visit (INDEPENDENT_AMBULATORY_CARE_PROVIDER_SITE_OTHER)
Admission: RE | Admit: 2021-01-17 | Discharge: 2021-01-17 | Disposition: A | Discharge status: Home / Self Care | Payer: Medicare HMO | Source: Ambulatory Visit | Attending: Vascular Surgery | Admitting: Vascular Surgery

## 2021-01-17 VITALS — BP 152/79 | HR 82 | Temp 98.2°F | Resp 20 | Ht 62.0 in | Wt 197.0 lb

## 2021-01-17 DIAGNOSIS — R600 Localized edema: Secondary | ICD-10-CM | POA: Diagnosis not present

## 2021-01-17 DIAGNOSIS — I714 Abdominal aortic aneurysm, without rupture, unspecified: Secondary | ICD-10-CM

## 2021-01-17 DIAGNOSIS — Z48812 Encounter for surgical aftercare following surgery on the circulatory system: Secondary | ICD-10-CM | POA: Diagnosis not present

## 2021-01-17 DIAGNOSIS — I739 Peripheral vascular disease, unspecified: Secondary | ICD-10-CM | POA: Diagnosis not present

## 2021-01-17 NOTE — Progress Notes (Signed)
REASON FOR CONSULT:    Vascular insufficiency.  The consult is requested by Dr. Glori Bickers  ASSESSMENT & PLAN:   PERIPHERAL VASCULAR DISEASE: The patient's left common iliac artery stent appears to be patent.  He has developed some disease in the right common iliac artery.  However he has normal femoral pulses so I do not think the iliac disease is significant.  Based on his exam I think he has evidence of infrainguinal arterial occlusive disease bilaterally.  Fortunately he is not a smoker.  He is asymptomatic and denies claudication or rest pain.  Obviously I think his activity is fairly limited.  If his symptoms progressed and certainly we could consider arteriography.  However given his age and multiple medical comorbidities I would favor a conservative approach initially.  CHRONIC VENOUS INSUFFICIENCY: This patient has CEAP C4b venous disease.  I explained that normally the treatment for this would be leg elevation and compression therapy.  However, given that he has significant peripheral vascular disease he may not be able to elevate his legs adequately as he may get rest pain with leg elevation.  He has not tried this and I instructed him to try this at home.  We have discussed the proper position for leg elevation.  In addition I did wrap his left leg today with two 4 inch Ace bandages and explained to him how to apply some mild compression.  I explained I did not want him to use too much compression given his peripheral vascular disease.  If his symptoms progressed and certainly we could consider arteriography to see if he was a candidate for endovascular revascularization as this would simplify the care of his venous disease.  However given his age and multiple medical comorbidities I do not think he is a good surgical candidate.  He does have normal femoral pulses and most likely has significant infrainguinal arterial occlusive disease especially on the left.   [name],  MD Office: [phone]   HPI:   [name] is a pleasant 87 y.o. male, who I had seen back in 2013 with left lower extremity claudication.  He had a previous left common iliac artery stent and had developed restenosis.  On 04/20/2012 he underwent placement of a self-expanding stent for his recurrent stenosis.  He was then lost to follow-up.  He was sent for evaluation of venous insufficiency and peripheral vascular disease.  He denies any history of claudication.  However, I think his activity is very limited.  He denies any history of rest pain.  He tells me that he does not elevate his legs.  He quit smoking in 1965.  He denies any previous history of DVT or phlebitis.  He states that his swelling is always more significant on the left side.  Has had hyperpigmentation bilaterally for some time.  Past Medical History:  Diagnosis Date  . Anxiety   . Asthma   . Chronic diastolic CHF (congestive heart failure) (HCC)    diastolic   . COPD (chronic obstructive pulmonary disease) ([place])   . Coronary artery disease    s/p PCI of RCA 03/2009 at which time there was 70% ramus and 90% RCA, cath 01/2011 showed patent stents in RCA with moderate prox disesae, aneurysmal left circ and small 90% ramus s/p PCI, patent LAD EF 55%  . Diabetes mellitus   . Diverticulitis Oct. 2013   bleeding in the past and Effient for his CAD was stopped  . DVT (deep venous thrombosis) (  HCC)   . GERD (gastroesophageal reflux disease)   . Hypercholesterolemia   . Hypertension   . Hypothyroidism   . Obesity (BMI 30-39.9)   . OSA (obstructive sleep apnea)    severe on CPAP  . Peripheral vascular disease ([place]) 11/2006   s/p left stent  . Shortness of breath   . Sleep apnea    severe OSA awaiting CPAP titration    Family History  Problem Relation Age of Onset  . Diabetes Mother   . Hyperlipidemia Mother   . Hypertension Mother   . Heart attack Mother   . Heart disease Mother        before age 56  . Diabetes  Brother   . Heart disease Father        before age 30  . Breast cancer Sister   . Diabetes Sister   . Hyperlipidemia Sister   . Hypertension Sister   . Heart attack Sister   . Hyperlipidemia Sister   . Hypertension Sister   . Heart disease Sister        Heart Disease before age 73  . Asthma Sister   . Malignant hyperthermia Neg Hx     SOCIAL HISTORY: Social History   Socioeconomic History  . Marital status: Widowed    Spouse name: Not on file  . Number of children: Not on file  . Years of education: Not on file  . Highest education level: Not on file  Occupational History  . Not on file  Tobacco Use  . Smoking status: Former Smoker    Packs/day: 0.50    Years: 15.00    Pack years: 7.50    Types: Cigarettes, Cigars    Quit date: 10/15/1963    Years since quitting: 57.2  . Smokeless tobacco: Never Used  Vaping Use  . Vaping Use: Never used  Substance and Sexual Activity  . Alcohol use: No  . Drug use: No  . Sexual activity: Never  Other Topics Concern  . Not on file  Social History Narrative  . Not on file   Social Determinants of Health   Financial Resource Strain: Not on file  Food Insecurity: Not on file  Transportation Needs: Not on file  Physical Activity: Not on file  Stress: Not on file  Social Connections: Not on file  Intimate Partner Violence: Not on file    Allergies  Allergen Reactions  . Clopidogrel Itching    Current Outpatient Medications  Medication Sig Dispense Refill  . albuterol (PROVENTIL HFA;VENTOLIN HFA) 108 (90 BASE) MCG/ACT inhaler Inhale 2 puffs into the lungs every 6 (six) hours as needed for wheezing or shortness of breath.     Marland Kitchen albuterol (PROVENTIL) (2.5 MG/3ML) 0.083% nebulizer solution Take 3 mLs (2.5 mg total) by nebulization every 4 (four) hours as needed for wheezing or shortness of breath. 75 mL 0  . aspirin EC 81 MG tablet Take 81 mg by mouth daily.    . Budeson-Glycopyrrol-Formoterol (BREZTRI AEROSPHERE) 160-9-4.8  MCG/ACT AERO Inhale 2 puffs into the lungs in the morning and at bedtime.    . budesonide (PULMICORT) 0.5 MG/2ML nebulizer solution Take 0.5 mg by nebulization 2 (two) times daily.    Marland Kitchen ezetimibe (ZETIA) 10 MG tablet Take 1 tablet by mouth once daily 60 tablet 1  . glimepiride (AMARYL) 2 MG tablet Take 2 mg by mouth every evening.     Marland Kitchen ipratropium-albuterol (DUONEB) 0.5-2.5 (3) MG/3ML SOLN Take 3 mLs by nebulization in the morning, at  noon, in the evening, and at bedtime.    Marland Kitchen levothyroxine (SYNTHROID, LEVOTHROID) 150 MCG tablet Take 150 mcg by mouth every evening.     . meloxicam (MOBIC) 15 MG tablet Take 15 mg by mouth daily as needed.     . metoprolol succinate (TOPROL-XL) 50 MG 24 hr tablet Take 1 tablet by mouth once daily 60 tablet 1  . nitroGLYCERIN (NITROSTAT) 0.4 MG SL tablet Place 1 tablet (0.4 mg total) under the tongue every 5 (five) minutes as needed for chest pain. 25 tablet 6  . rosuvastatin (CRESTOR) 20 MG tablet Take 1 tablet (20 mg total) by mouth daily. 90 tablet 3  . torsemide (DEMADEX) 20 MG tablet Take 2 tablets (40 mg total) by mouth daily. 60 tablet 3   No current facility-administered medications for this visit.    REVIEW OF SYSTEMS:  [X]  denotes positive finding, [ ]  denotes negative finding Cardiac  Comments:  Chest pain or chest pressure:    Shortness of breath upon exertion: x   Short of breath when lying flat:    Irregular heart rhythm:        Vascular    Pain in calf, thigh, or hip brought on by ambulation:    Pain in feet at night that wakes you up from your sleep:     Blood clot in your veins:    Leg swelling:  x       Pulmonary    Oxygen at home:    Productive cough:     Wheezing:         Neurologic    Sudden weakness in arms or legs:     Sudden numbness in arms or legs:     Sudden onset of difficulty speaking or slurred speech:    Temporary loss of vision in one eye:     Problems with dizziness:         Gastrointestinal    Blood in stool:      Vomited blood:         Genitourinary    Burning when urinating:     Blood in urine:        Psychiatric    Major depression:         Hematologic    Bleeding problems:    Problems with blood clotting too easily:        Skin    Rashes or ulcers:        Constitutional    Fever or chills:     PHYSICAL EXAM:   Vitals:   01/17/21 1053  BP: (!) 152/79  Pulse: 82  Resp: 20  Temp: 98.2 F (36.8 C)  SpO2: 92%  Weight: 197 lb (89.4 kg)  Height: 5\' 2"  (1.575 m)   Body mass index is 36.03 kg/m.  GENERAL: The patient is a well-nourished male, in no acute distress. The vital signs are documented above. CARDIAC: There is a regular rate and rhythm.  VASCULAR: I do not detect carotid bruits. He has palpable femoral pulses. He has significant bilateral lower extremity swelling I cannot palpate pedal pulses. He has hyperpigmentation bilaterally consistent with chronic venous insufficiency.  On the left side he has significant lipodermatosclerosis.     PULMONARY: There is good air exchange bilaterally without wheezing or rales. ABDOMEN: Soft and non-tender with normal pitched bowel sounds.  MUSCULOSKELETAL: There are no major deformities or cyanosis. NEUROLOGIC: No focal weakness or paresthesias are detected. SKIN: There are no ulcers or rashes noted. PSYCHIATRIC:  The patient has a normal affect.  DATA:    ARTERIAL DOPPLER STUDY: I have independently interpreted his arterial Doppler study today.  On the right side, there is a monophasic dorsalis pedis and posterior tibial signal.  ABIs 76%.  Toe pressures 87 mmHg.  On the left side there is a dampened monophasic posterior tibial signal with a monophasic dorsalis pedis signal.  ABIs 42%.  Toe pressure 65 mmHg.  ARTERIAL DUPLEX: I have independently interpreted his arterial duplex of the aorta and iliac arteries.  Visualization was somewhat limited although it did appear that the stent on the left was patent with elevated  velocities in the proximal right common iliac artery.

## 2021-01-18 ENCOUNTER — Other Ambulatory Visit: Payer: Self-pay

## 2021-01-18 DIAGNOSIS — I739 Peripheral vascular disease, unspecified: Secondary | ICD-10-CM

## 2021-02-04 DIAGNOSIS — J441 Chronic obstructive pulmonary disease with (acute) exacerbation: Secondary | ICD-10-CM | POA: Diagnosis not present

## 2021-02-19 ENCOUNTER — Telehealth: Payer: Self-pay

## 2021-02-19 NOTE — Telephone Encounter (Signed)
Patient has had increasing pain in his LLE over the last few weeks. He has no wounds. Daughter says that he was in a little pain at his last visit on 01/17/21. At that time, patient had ABIs and IVC duplex. His symptoms have progressed - will bring in for follow up with [facility].

## 2021-02-28 ENCOUNTER — Ambulatory Visit: Payer: Medicare HMO | Admitting: Vascular Surgery

## 2021-02-28 ENCOUNTER — Encounter: Payer: Self-pay | Admitting: Vascular Surgery

## 2021-02-28 ENCOUNTER — Other Ambulatory Visit: Payer: Self-pay

## 2021-02-28 VITALS — BP 159/80 | HR 71 | Temp 97.6°F | Resp 20 | Ht 62.0 in | Wt 200.0 lb

## 2021-02-28 DIAGNOSIS — I739 Peripheral vascular disease, unspecified: Secondary | ICD-10-CM

## 2021-02-28 DIAGNOSIS — I872 Venous insufficiency (chronic) (peripheral): Secondary | ICD-10-CM | POA: Diagnosis not present

## 2021-02-28 NOTE — Progress Notes (Signed)
Patient name: [name] MRN: [id number] DOB: [birth year] Sex: male  REASON FOR VISIT:   Follow-up of peripheral vascular disease and chronic venous insufficiency.  HPI:   [name] is a pleasant 87 y.o. male who I last saw on 01/17/2021.  He had combined peripheral vascular disease and chronic venous insufficiency.  The patient has had previous stenting of the left common iliac artery and the stent was patent.  He had some disease in the right common iliac artery stent however he had normal femoral pulses so I did not think this was significant.  Based on his exam I think he did have evidence of infrainguinal arterial occlusive disease bilaterally.  He was asymptomatic.  I felt we would only consider arteriography if he develops significant symptoms and certainly given his age and comorbidities he would be at slightly high risk for any invasive procedures.  In addition he has CEAP C4b venous disease.  Normally we would recommend significant leg elevation and compression therapy but given his peripheral vascular disease I suggested only elevating his legs on 1 or 2 pillows.  We recommended mild compression with a 4 inch Ace.  He came in sooner than his regularly scheduled appointment because he was having some increased pain in the left leg according to his daughter.  On my history I do not get any clear-cut history of claudication although I think his activity is very limited.  I cannot get any clear-cut history of rest pain.  He denies any nonhealing ulcers.  He multiple medical comorbidities including coronary artery disease, chronic diastolic congestive heart failure, COPD, and obesity (Body mass index is 36.58 kg/m.)  Current Outpatient Medications  Medication Sig Dispense Refill  . albuterol (PROVENTIL HFA;VENTOLIN HFA) 108 (90 BASE) MCG/ACT inhaler Inhale 2 puffs into the lungs every 6 (six) hours as needed for wheezing or shortness of breath.     Marland Kitchen albuterol (PROVENTIL) (2.5 MG/3ML)  0.083% nebulizer solution Take 3 mLs (2.5 mg total) by nebulization every 4 (four) hours as needed for wheezing or shortness of breath. 75 mL 0  . aspirin EC 81 MG tablet Take 81 mg by mouth daily.    . Budeson-Glycopyrrol-Formoterol (BREZTRI AEROSPHERE) 160-9-4.8 MCG/ACT AERO Inhale 2 puffs into the lungs in the morning and at bedtime.    . budesonide (PULMICORT) 0.5 MG/2ML nebulizer solution Take 0.5 mg by nebulization 2 (two) times daily.    Marland Kitchen ezetimibe (ZETIA) 10 MG tablet Take 1 tablet by mouth once daily 60 tablet 1  . glimepiride (AMARYL) 2 MG tablet Take 2 mg by mouth every evening.     Marland Kitchen ipratropium-albuterol (DUONEB) 0.5-2.5 (3) MG/3ML SOLN Take 3 mLs by nebulization in the morning, at noon, in the evening, and at bedtime.    Marland Kitchen levothyroxine (SYNTHROID, LEVOTHROID) 150 MCG tablet Take 150 mcg by mouth every evening.     . meloxicam (MOBIC) 15 MG tablet Take 15 mg by mouth daily as needed.     . metoprolol succinate (TOPROL-XL) 50 MG 24 hr tablet Take 1 tablet by mouth once daily 60 tablet 1  . nitroGLYCERIN (NITROSTAT) 0.4 MG SL tablet Place 1 tablet (0.4 mg total) under the tongue every 5 (five) minutes as needed for chest pain. 25 tablet 6  . rosuvastatin (CRESTOR) 20 MG tablet Take 1 tablet (20 mg total) by mouth daily. 90 tablet 3  . torsemide (DEMADEX) 20 MG tablet Take 2 tablets (40 mg total) by mouth daily. 60 tablet 3  No current facility-administered medications for this visit.    REVIEW OF SYSTEMS:  [X]  denotes positive finding, [ ]  denotes negative finding Vascular    Leg swelling    Cardiac    Chest pain or chest pressure:    Shortness of breath upon exertion:    Short of breath when lying flat:    Irregular heart rhythm:    Constitutional    Fever or chills:     PHYSICAL EXAM:   Vitals:   02/28/21 1333  BP: (!) 159/80  Pulse: 71  Resp: 20  Temp: 97.6 F (36.4 C)  SpO2: 94%  Weight: 200 lb (90.7 kg)  Height: 5\' 2"  (1.575 m)    GENERAL: The patient is  a well-nourished male, in no acute distress. The vital signs are documented above. CARDIOVASCULAR: There is a regular rate and rhythm. PULMONARY: There is good air exchange bilaterally without wheezing or rales. VASCULAR: He has palpable femoral pulses.  I cannot palpate popliteal or pedal pulses.  He has significant swelling and hyperpigmentation bilaterally consistent with chronic venous insufficiency.  This is more significant on the left side.  DATA:   His previous noninvasive study showed an ABI of 76% on the right with a toe pressure of 87 mmHg.  On the left and an ABI of 42% with a toe pressure 65 mmHg.  MEDICAL ISSUES:   COMBINED PERIPHERAL VASCULAR DISEASE AND CHRONIC VENOUS INSUFFICIENCY: This patient has evidence of infrainguinal arterial occlusive disease on exam.  He also has significant chronic venous insufficiency with CEAP C4b venous disease.  I explained that given that he is having minimal symptoms related to his peripheral vascular disease I would not favor arteriography given his age and multiple medical comorbidities as noted above.  If his symptoms were not tolerable then I do not think it would be unreasonable to proceed with arteriography however currently his symptoms are minimal.  He does have significant chronic venous insufficiency and I have explained that his peripheral vascular disease limits his ability to elevate the legs and use compression therapy.  This would be another reason to consider arteriography however currently he has no open ulcers and again I would not favor an overly aggressive approach.  I plan on seeing him back in 3 months.  Certainly if his symptoms progress then we could consider arteriography.  [name] [facility] [phone]

## 2021-02-28 NOTE — H&P (View-Only) (Signed)
Patient name: [name] MRN: [id number] DOB: [birth year] Sex: male  REASON FOR VISIT:   Follow-up of peripheral vascular disease and chronic venous insufficiency.  HPI:   [name] is a pleasant 87 y.o. male who I last saw on 01/17/2021.  He had combined peripheral vascular disease and chronic venous insufficiency.  The patient has had previous stenting of the left common iliac artery and the stent was patent.  He had some disease in the right common iliac artery stent however he had normal femoral pulses so I did not think this was significant.  Based on his exam I think he did have evidence of infrainguinal arterial occlusive disease bilaterally.  He was asymptomatic.  I felt we would only consider arteriography if he develops significant symptoms and certainly given his age and comorbidities he would be at slightly high risk for any invasive procedures.  In addition he has CEAP C4b venous disease.  Normally we would recommend significant leg elevation and compression therapy but given his peripheral vascular disease I suggested only elevating his legs on 1 or 2 pillows.  We recommended mild compression with a 4 inch Ace.  He came in sooner than his regularly scheduled appointment because he was having some increased pain in the left leg according to his daughter.  On my history I do not get any clear-cut history of claudication although I think his activity is very limited.  I cannot get any clear-cut history of rest pain.  He denies any nonhealing ulcers.  He multiple medical comorbidities including coronary artery disease, chronic diastolic congestive heart failure, COPD, and obesity (Body mass index is 36.58 kg/m.)  Current Outpatient Medications  Medication Sig Dispense Refill  . albuterol (PROVENTIL HFA;VENTOLIN HFA) 108 (90 BASE) MCG/ACT inhaler Inhale 2 puffs into the lungs every 6 (six) hours as needed for wheezing or shortness of breath.     Marland Kitchen albuterol (PROVENTIL) (2.5 MG/3ML)  0.083% nebulizer solution Take 3 mLs (2.5 mg total) by nebulization every 4 (four) hours as needed for wheezing or shortness of breath. 75 mL 0  . aspirin EC 81 MG tablet Take 81 mg by mouth daily.    . Budeson-Glycopyrrol-Formoterol (BREZTRI AEROSPHERE) 160-9-4.8 MCG/ACT AERO Inhale 2 puffs into the lungs in the morning and at bedtime.    . budesonide (PULMICORT) 0.5 MG/2ML nebulizer solution Take 0.5 mg by nebulization 2 (two) times daily.    Marland Kitchen ezetimibe (ZETIA) 10 MG tablet Take 1 tablet by mouth once daily 60 tablet 1  . glimepiride (AMARYL) 2 MG tablet Take 2 mg by mouth every evening.     Marland Kitchen ipratropium-albuterol (DUONEB) 0.5-2.5 (3) MG/3ML SOLN Take 3 mLs by nebulization in the morning, at noon, in the evening, and at bedtime.    Marland Kitchen levothyroxine (SYNTHROID, LEVOTHROID) 150 MCG tablet Take 150 mcg by mouth every evening.     . meloxicam (MOBIC) 15 MG tablet Take 15 mg by mouth daily as needed.     . metoprolol succinate (TOPROL-XL) 50 MG 24 hr tablet Take 1 tablet by mouth once daily 60 tablet 1  . nitroGLYCERIN (NITROSTAT) 0.4 MG SL tablet Place 1 tablet (0.4 mg total) under the tongue every 5 (five) minutes as needed for chest pain. 25 tablet 6  . rosuvastatin (CRESTOR) 20 MG tablet Take 1 tablet (20 mg total) by mouth daily. 90 tablet 3  . torsemide (DEMADEX) 20 MG tablet Take 2 tablets (40 mg total) by mouth daily. 60 tablet 3  No current facility-administered medications for this visit.    REVIEW OF SYSTEMS:  [X]  denotes positive finding, [ ]  denotes negative finding Vascular    Leg swelling    Cardiac    Chest pain or chest pressure:    Shortness of breath upon exertion:    Short of breath when lying flat:    Irregular heart rhythm:    Constitutional    Fever or chills:     PHYSICAL EXAM:   Vitals:   02/28/21 1333  BP: (!) 159/80  Pulse: 71  Resp: 20  Temp: 97.6 F (36.4 C)  SpO2: 94%  Weight: 200 lb (90.7 kg)  Height: 5\' 2"  (1.575 m)    GENERAL: The patient is  a well-nourished male, in no acute distress. The vital signs are documented above. CARDIOVASCULAR: There is a regular rate and rhythm. PULMONARY: There is good air exchange bilaterally without wheezing or rales. VASCULAR: He has palpable femoral pulses.  I cannot palpate popliteal or pedal pulses.  He has significant swelling and hyperpigmentation bilaterally consistent with chronic venous insufficiency.  This is more significant on the left side.  DATA:   His previous noninvasive study showed an ABI of 76% on the right with a toe pressure of 87 mmHg.  On the left and an ABI of 42% with a toe pressure 65 mmHg.  MEDICAL ISSUES:   COMBINED PERIPHERAL VASCULAR DISEASE AND CHRONIC VENOUS INSUFFICIENCY: This patient has evidence of infrainguinal arterial occlusive disease on exam.  He also has significant chronic venous insufficiency with CEAP C4b venous disease.  I explained that given that he is having minimal symptoms related to his peripheral vascular disease I would not favor arteriography given his age and multiple medical comorbidities as noted above.  If his symptoms were not tolerable then I do not think it would be unreasonable to proceed with arteriography however currently his symptoms are minimal.  He does have significant chronic venous insufficiency and I have explained that his peripheral vascular disease limits his ability to elevate the legs and use compression therapy.  This would be another reason to consider arteriography however currently he has no open ulcers and again I would not favor an overly aggressive approach.  I plan on seeing him back in 3 months.  Certainly if his symptoms progress then we could consider arteriography.  [name] [facility] [phone]

## 2021-03-01 ENCOUNTER — Ambulatory Visit (HOSPITAL_COMMUNITY)
Admission: RE | Admit: 2021-03-01 | Discharge: 2021-03-01 | Disposition: A | Discharge status: Home / Self Care | Payer: Medicare HMO | Source: Ambulatory Visit | Attending: Internal Medicine | Admitting: Internal Medicine

## 2021-03-01 ENCOUNTER — Encounter (HOSPITAL_COMMUNITY): Payer: Self-pay | Admitting: Internal Medicine

## 2021-03-01 VITALS — BP 122/62 | HR 75 | Wt 199.8 lb

## 2021-03-01 DIAGNOSIS — Z7982 Long term (current) use of aspirin: Secondary | ICD-10-CM | POA: Insufficient documentation

## 2021-03-01 DIAGNOSIS — E039 Hypothyroidism, unspecified: Secondary | ICD-10-CM | POA: Insufficient documentation

## 2021-03-01 DIAGNOSIS — J449 Chronic obstructive pulmonary disease, unspecified: Secondary | ICD-10-CM | POA: Insufficient documentation

## 2021-03-01 DIAGNOSIS — Z87891 Personal history of nicotine dependence: Secondary | ICD-10-CM | POA: Diagnosis not present

## 2021-03-01 DIAGNOSIS — E785 Hyperlipidemia, unspecified: Secondary | ICD-10-CM | POA: Insufficient documentation

## 2021-03-01 DIAGNOSIS — G4733 Obstructive sleep apnea (adult) (pediatric): Secondary | ICD-10-CM | POA: Insufficient documentation

## 2021-03-01 DIAGNOSIS — Z8672 Personal history of thrombophlebitis: Secondary | ICD-10-CM

## 2021-03-01 DIAGNOSIS — R609 Edema, unspecified: Secondary | ICD-10-CM | POA: Insufficient documentation

## 2021-03-01 DIAGNOSIS — I110 Hypertensive heart disease with heart failure: Secondary | ICD-10-CM | POA: Insufficient documentation

## 2021-03-01 DIAGNOSIS — Z955 Presence of coronary angioplasty implant and graft: Secondary | ICD-10-CM | POA: Insufficient documentation

## 2021-03-01 DIAGNOSIS — E1151 Type 2 diabetes mellitus with diabetic peripheral angiopathy without gangrene: Secondary | ICD-10-CM | POA: Diagnosis not present

## 2021-03-01 DIAGNOSIS — I998 Other disorder of circulatory system: Secondary | ICD-10-CM | POA: Diagnosis not present

## 2021-03-01 DIAGNOSIS — Z79899 Other long term (current) drug therapy: Secondary | ICD-10-CM | POA: Diagnosis not present

## 2021-03-01 DIAGNOSIS — I2510 Atherosclerotic heart disease of native coronary artery without angina pectoris: Secondary | ICD-10-CM | POA: Diagnosis not present

## 2021-03-01 DIAGNOSIS — R600 Localized edema: Secondary | ICD-10-CM

## 2021-03-01 NOTE — Patient Instructions (Signed)
Take your Torsemide at least 3 times a week  Please wear your compression hose daily, place them on as soon as you get up in the morning and remove before you go to bed at night.  Please call our office in October to schedule your follow up appointment  If you have any questions or concerns before your next appointment please send [place] a message through [place] or call our office at [phone].    TO LEAVE A MESSAGE FOR THE NURSE SELECT OPTION 2, PLEASE LEAVE A MESSAGE INCLUDING: . YOUR NAME . DATE OF BIRTH . CALL BACK NUMBER . REASON FOR CALL**this is important as we prioritize the call backs  [place] AS LONG AS YOU CALL BEFORE 4:00 PM  At the [facility], you and your health needs are our priority. As part of our continuing mission to provide you with exceptional heart care, we have created designated Provider Care Teams. These Care Teams include your primary Cardiologist (physician) and Advanced Practice Providers (APPs- Physician Assistants and Nurse Practitioners) who all work together to provide you with the care you need, when you need it.   You may see any of the following providers on your designated Care Team at your next follow up: Marland Kitchen Dr Glori Bickers . Dr Loralie Champagne . Dr Vickki Muff . Darrick Grinder, NP . Lyda Jester, [place] . Audry Riles, PharmD   Please be sure to bring in all your medications bottles to every appointment.

## 2021-03-01 NOTE — Progress Notes (Signed)
Advanced Heart Failure Clinic Note   Referring Physician: PCP: [name], MD PCP-Cardiologist: Fransico Him, MD   HPI:  Mr, Hudnall is an 87 yo male with HFpEF, COPD has PRN oxygen at home for exertion, DM2, HTN, HLD, CAD s/p DES x4, hypothyroidism, PAD s/p LLE stent, severe OSA, diverticulitis w/ bleeding, GERD. Referred by Dr. Brigitte Pulse for further evaluation of LE edema.   Followed by Dr. Chase Caller for COPD.  PFTs 05/20/18- FVC 1.63 (60%), FEV1  0.77 (42%), Ratio 48 (65%)  HRCT 05/11/18 No findings to suggest ILD. Mild diffuse bronchial wall thickening with mild centrilobular and paraseptal emphysema.   Had PFT's 11/2020 with stable severe irway obstruction.  Had repeat ECHO on 09/2020 with LVEF 55-60%, G1DD, no valvular pathology, Normal RV function and pressure, No TR  Personally reviewed  Seen in 2/22 for first visit due to LE edema. Felt to be most likely related to venous insufficiency. U/s negative for DVT. Referred to [facility].   Here for f/u with his daughter. Says his legs continue to swell L > R. Has not been taking his torsemide for unclear reasons. Saw Dr. Scot Dock in [facility] yesterday and felt to have combined arterial and venous disease. Mr     Past Medical History:  Diagnosis Date  . Anxiety   . Asthma   . Chronic diastolic CHF (congestive heart failure) (HCC)    diastolic   . COPD (chronic obstructive pulmonary disease) ([place])   . Coronary artery disease    s/p PCI of RCA 03/2009 at which time there was 70% ramus and 90% RCA, cath 01/2011 showed patent stents in RCA with moderate prox disesae, aneurysmal left circ and small 90% ramus s/p PCI, patent LAD EF 55%  . Diabetes mellitus   . Diverticulitis Oct. 2013   bleeding in the past and Effient for his CAD was stopped  . DVT (deep venous thrombosis) ([place])   . GERD (gastroesophageal reflux disease)   . Hypercholesterolemia   . Hypertension   . Hypothyroidism   . Obesity (BMI 30-39.9)   . OSA (obstructive sleep apnea)     severe on CPAP  . Peripheral vascular disease ([place]) 11/2006   s/p left stent  . Shortness of breath   . Sleep apnea    severe OSA awaiting CPAP titration    Current Outpatient Medications  Medication Sig Dispense Refill  . albuterol (PROVENTIL HFA;VENTOLIN HFA) 108 (90 BASE) MCG/ACT inhaler Inhale 2 puffs into the lungs every 6 (six) hours as needed for wheezing or shortness of breath.     Marland Kitchen albuterol (PROVENTIL) (2.5 MG/3ML) 0.083% nebulizer solution Take 3 mLs (2.5 mg total) by nebulization every 4 (four) hours as needed for wheezing or shortness of breath. 75 mL 0  . aspirin EC 81 MG tablet Take 81 mg by mouth daily.    . Budeson-Glycopyrrol-Formoterol (BREZTRI AEROSPHERE) 160-9-4.8 MCG/ACT AERO Inhale 2 puffs into the lungs in the morning and at bedtime.    Marland Kitchen ezetimibe (ZETIA) 10 MG tablet Take 1 tablet by mouth once daily 60 tablet 1  . glimepiride (AMARYL) 2 MG tablet Take 2 mg by mouth every evening.     Marland Kitchen ipratropium-albuterol (DUONEB) 0.5-2.5 (3) MG/3ML SOLN Take 3 mLs by nebulization as needed.    Marland Kitchen levothyroxine (SYNTHROID, LEVOTHROID) 150 MCG tablet Take 150 mcg by mouth every evening.     . meloxicam (MOBIC) 15 MG tablet Take 15 mg by mouth daily as needed.     . metoprolol succinate (TOPROL-XL) 50  MG 24 hr tablet Take 1 tablet by mouth once daily 60 tablet 1  . nitroGLYCERIN (NITROSTAT) 0.4 MG SL tablet Place 1 tablet (0.4 mg total) under the tongue every 5 (five) minutes as needed for chest pain. 25 tablet 6  . rosuvastatin (CRESTOR) 20 MG tablet Take 1 tablet (20 mg total) by mouth daily. 90 tablet 3  . torsemide (DEMADEX) 20 MG tablet Take 2 tablets (40 mg total) by mouth daily. 60 tablet 3   No current facility-administered medications for this encounter.    Allergies  Allergen Reactions  . Clopidogrel Itching      Social History   Socioeconomic History  . Marital status: Widowed    Spouse name: Not on file  . Number of children: Not on file  . Years of  education: Not on file  . Highest education level: Not on file  Occupational History  . Not on file  Tobacco Use  . Smoking status: Former Smoker    Packs/day: 0.50    Years: 15.00    Pack years: 7.50    Types: Cigarettes, Cigars    Quit date: 10/15/1963    Years since quitting: 57.4  . Smokeless tobacco: Never Used  Vaping Use  . Vaping Use: Never used  Substance and Sexual Activity  . Alcohol use: No  . Drug use: No  . Sexual activity: Never  Other Topics Concern  . Not on file  Social History Narrative  . Not on file   Social Determinants of Health   Financial Resource Strain: Not on file  Food Insecurity: Not on file  Transportation Needs: Not on file  Physical Activity: Not on file  Stress: Not on file  Social Connections: Not on file  Intimate Partner Violence: Not on file      Family History  Problem Relation Age of Onset  . Diabetes Mother   . Hyperlipidemia Mother   . Hypertension Mother   . Heart attack Mother   . Heart disease Mother        before age 89  . Diabetes Brother   . Heart disease Father        before age 35  . Breast cancer Sister   . Diabetes Sister   . Hyperlipidemia Sister   . Hypertension Sister   . Heart attack Sister   . Hyperlipidemia Sister   . Hypertension Sister   . Heart disease Sister        Heart Disease before age 49  . Asthma Sister   . Malignant hyperthermia Neg Hx     Vitals:   03/01/21 1459  BP: 122/62  Pulse: 75  SpO2: 97%  Weight: 90.6 kg (199 lb 12.8 oz)   Wt Readings from Last 3 Encounters:  03/01/21 90.6 kg (199 lb 12.8 oz)  02/28/21 90.7 kg (200 lb)  01/17/21 89.4 kg (197 lb)      PHYSICAL EXAM: General:  Well appearing. No resp difficulty HEENT: normal Neck: supple. no JVD. Carotids 2+ bilat; no bruits. No lymphadenopathy or thryomegaly appreciated. Cor: PMI nondisplaced. Regular rate & rhythm. No rubs, gallops or murmurs. Lungs: clear Abdomen: soft, nontender, nondistended. No  hepatosplenomegaly. No bruits or masses. Good bowel sounds. Extremities: no cyanosis, clubbing, rash, 2-3+ lymphedema L>>R Neuro: alert & orientedx3, cranial nerves grossly intact. moves all 4 extremities w/o difficulty. Affect pleasant    ASSESSMENT & PLAN:  1. LE edema - REDS lung water is normal 27% suggest this is not likely predominant left heart  failure currently though his lung water may have been high prior to recent diuresis - Given severe COPD is at risk for PAH and RV failure but echo not suggestive of this - Given assymmetric edema and presence of LE varicosities suspect this is primarily venous insufficiency - He has seen Dr. Scot Dock in [facility] who feels a combination of severe PAD and venous insufficiency. Options limited and Mr. Hack does not want to pursue any of them  - I have suggested very light compression, mild elevation and taking toresmide at least 2-3 days per week.   2. COPD, severe -likely main source of his dyspnea along with deconditioning -reviewed PFT's which were stable obstructive disease, no DLCO values available -Followed by Dr. Chase Caller -continue triple therapy inhalers and nebs  -No desats < 90% on hall walk   3. OSA not on CPAP: -would continue encouraging the patient to use, perhaps can switch to nasal mask only  4. CAD s/p DES x4: -no s/s angina continue asa, zetia, crestor  5. PAD: - follows with Dr. Lilian Kapur, MD  3:15 PM

## 2021-03-06 DIAGNOSIS — J441 Chronic obstructive pulmonary disease with (acute) exacerbation: Secondary | ICD-10-CM | POA: Diagnosis not present

## 2021-03-13 ENCOUNTER — Other Ambulatory Visit: Payer: Self-pay

## 2021-03-15 DIAGNOSIS — I5030 Unspecified diastolic (congestive) heart failure: Secondary | ICD-10-CM | POA: Diagnosis not present

## 2021-03-15 DIAGNOSIS — G4733 Obstructive sleep apnea (adult) (pediatric): Secondary | ICD-10-CM | POA: Diagnosis not present

## 2021-03-15 DIAGNOSIS — E1159 Type 2 diabetes mellitus with other circulatory complications: Secondary | ICD-10-CM | POA: Diagnosis not present

## 2021-03-15 DIAGNOSIS — I110 Hypertensive heart disease with heart failure: Secondary | ICD-10-CM | POA: Diagnosis not present

## 2021-03-15 DIAGNOSIS — I2510 Atherosclerotic heart disease of native coronary artery without angina pectoris: Secondary | ICD-10-CM | POA: Diagnosis not present

## 2021-03-15 DIAGNOSIS — E782 Mixed hyperlipidemia: Secondary | ICD-10-CM | POA: Diagnosis not present

## 2021-03-15 DIAGNOSIS — E039 Hypothyroidism, unspecified: Secondary | ICD-10-CM | POA: Diagnosis not present

## 2021-03-15 DIAGNOSIS — J449 Chronic obstructive pulmonary disease, unspecified: Secondary | ICD-10-CM | POA: Diagnosis not present

## 2021-03-15 DIAGNOSIS — I739 Peripheral vascular disease, unspecified: Secondary | ICD-10-CM | POA: Diagnosis not present

## 2021-03-15 DIAGNOSIS — E669 Obesity, unspecified: Secondary | ICD-10-CM | POA: Diagnosis not present

## 2021-03-16 ENCOUNTER — Other Ambulatory Visit: Payer: Self-pay

## 2021-03-16 ENCOUNTER — Encounter (HOSPITAL_COMMUNITY)
Admission: RE | Disposition: A | Discharge status: Home / Self Care | Payer: Self-pay | Source: Home / Self Care | Attending: Vascular Surgery

## 2021-03-16 ENCOUNTER — Ambulatory Visit (HOSPITAL_COMMUNITY)
Admission: RE | Admit: 2021-03-16 | Discharge: 2021-03-16 | Disposition: A | Discharge status: Home / Self Care | Payer: Medicare HMO | Attending: Vascular Surgery | Admitting: Vascular Surgery

## 2021-03-16 DIAGNOSIS — Z79899 Other long term (current) drug therapy: Secondary | ICD-10-CM | POA: Insufficient documentation

## 2021-03-16 DIAGNOSIS — E669 Obesity, unspecified: Secondary | ICD-10-CM | POA: Insufficient documentation

## 2021-03-16 DIAGNOSIS — Z6836 Body mass index (BMI) 36.0-36.9, adult: Secondary | ICD-10-CM | POA: Insufficient documentation

## 2021-03-16 DIAGNOSIS — I2510 Atherosclerotic heart disease of native coronary artery without angina pectoris: Secondary | ICD-10-CM | POA: Diagnosis not present

## 2021-03-16 DIAGNOSIS — Z7984 Long term (current) use of oral hypoglycemic drugs: Secondary | ICD-10-CM | POA: Diagnosis not present

## 2021-03-16 DIAGNOSIS — I7092 Chronic total occlusion of artery of the extremities: Secondary | ICD-10-CM | POA: Diagnosis not present

## 2021-03-16 DIAGNOSIS — J449 Chronic obstructive pulmonary disease, unspecified: Secondary | ICD-10-CM | POA: Diagnosis not present

## 2021-03-16 DIAGNOSIS — I509 Heart failure, unspecified: Secondary | ICD-10-CM | POA: Diagnosis not present

## 2021-03-16 DIAGNOSIS — Z7982 Long term (current) use of aspirin: Secondary | ICD-10-CM | POA: Insufficient documentation

## 2021-03-16 DIAGNOSIS — I739 Peripheral vascular disease, unspecified: Secondary | ICD-10-CM | POA: Diagnosis not present

## 2021-03-16 DIAGNOSIS — I70293 Other atherosclerosis of native arteries of extremities, bilateral legs: Secondary | ICD-10-CM | POA: Diagnosis not present

## 2021-03-16 DIAGNOSIS — Z79890 Hormone replacement therapy (postmenopausal): Secondary | ICD-10-CM | POA: Insufficient documentation

## 2021-03-16 DIAGNOSIS — I872 Venous insufficiency (chronic) (peripheral): Secondary | ICD-10-CM | POA: Diagnosis not present

## 2021-03-16 HISTORY — PX: ABDOMINAL AORTOGRAM W/LOWER EXTREMITY: CATH118223

## 2021-03-16 LAB — POCT I-STAT, CHEM 8
BUN: 11 mg/dL (ref 8–23)
Calcium, Ion: 1.38 mmol/L (ref 1.15–1.40)
Chloride: 98 mmol/L (ref 98–111)
Creatinine, Ser: 1 mg/dL (ref 0.61–1.24)
Glucose, Bld: 141 mg/dL — ABNORMAL HIGH (ref 70–99)
HCT: 45 % (ref 39.0–52.0)
Hemoglobin: 15.3 g/dL (ref 13.0–17.0)
Potassium: 4 mmol/L (ref 3.5–5.1)
Sodium: 141 mmol/L (ref 135–145)
TCO2: 30 mmol/L (ref 22–32)

## 2021-03-16 LAB — GLUCOSE, CAPILLARY
Glucose-Capillary: 120 mg/dL — ABNORMAL HIGH (ref 70–99)
Glucose-Capillary: 124 mg/dL — ABNORMAL HIGH (ref 70–99)

## 2021-03-16 SURGERY — ABDOMINAL AORTOGRAM W/LOWER EXTREMITY
Anesthesia: LOCAL | Laterality: Bilateral

## 2021-03-16 MED ORDER — HEPARIN (PORCINE) IN NACL 1000-0.9 UT/500ML-% IV SOLN
INTRAVENOUS | Status: AC
  Filled 2021-03-16: qty 1000

## 2021-03-16 MED ORDER — LIDOCAINE HCL (PF) 1 % IJ SOLN
INTRAMUSCULAR | Status: DC | PRN
  Administered 2021-03-16: 20 mL

## 2021-03-16 MED ORDER — SODIUM CHLORIDE 0.9 % IV SOLN: INTRAVENOUS | Status: DC

## 2021-03-16 MED ORDER — SODIUM CHLORIDE 0.9 % WEIGHT BASED INFUSION: 1.0000 mL/kg/h | INTRAVENOUS | Status: DC

## 2021-03-16 MED ORDER — LIDOCAINE HCL (PF) 1 % IJ SOLN
INTRAMUSCULAR | Status: AC
  Filled 2021-03-16: qty 30

## 2021-03-16 MED ORDER — HEPARIN (PORCINE) IN NACL 1000-0.9 UT/500ML-% IV SOLN
INTRAVENOUS | Status: DC | PRN
  Administered 2021-03-16 (×2): 500 mL

## 2021-03-16 MED ORDER — IODIXANOL 320 MG/ML IV SOLN
INTRAVENOUS | Status: DC | PRN
  Administered 2021-03-16: 165 mL

## 2021-03-16 SURGICAL SUPPLY — 14 items
CATH ANGIO 5F BER2 65CM (CATHETERS) ×1 IMPLANT
CATH ANGIO 5F PIGTAIL 65CM (CATHETERS) ×1 IMPLANT
CLOSURE MYNX CONTROL 5F (Vascular Products) ×1 IMPLANT
KIT ENCORE 26 ADVANTAGE (KITS) ×1 IMPLANT
KIT MICROPUNCTURE NIT STIFF (SHEATH) ×1 IMPLANT
KIT PV (KITS) ×2 IMPLANT
SHEATH PINNACLE 5F 10CM (SHEATH) ×1 IMPLANT
SHEATH PROBE COVER 6X72 (BAG) ×1 IMPLANT
STOPCOCK MORSE 400PSI 3WAY (MISCELLANEOUS) ×1 IMPLANT
SYR MEDRAD MARK 7 150ML (SYRINGE) ×2 IMPLANT
TRANSDUCER W/STOPCOCK (MISCELLANEOUS) ×2 IMPLANT
TRAY PV CATH (CUSTOM PROCEDURE TRAY) ×2 IMPLANT
TUBING CIL FLEX 10 FLL-RA (TUBING) ×1 IMPLANT
WIRE HITORQ VERSACORE ST 145CM (WIRE) ×1 IMPLANT

## 2021-03-16 NOTE — Interval H&P Note (Signed)
History and Physical Interval Note:  03/16/2021 9:59 AM  [name]  has presented today for surgery, with the diagnosis of pvd.  The various methods of treatment have been discussed with the patient and family. After consideration of risks, benefits and other options for treatment, the patient has consented to  Procedure(s): ABDOMINAL AORTOGRAM W/LOWER EXTREMITY (N/A) as a surgical intervention.  The patient's history has been reviewed, patient examined, no change in status, stable for surgery.  I have reviewed the patient's chart and labs.  Questions were answered to the patient's satisfaction.     [name]

## 2021-03-16 NOTE — Discharge Instructions (Signed)
Femoral Site Care This sheet gives you information about how to care for yourself after your procedure. Your health care provider may also give you more specific instructions. If you have problems or questions, contact your health care provider. What can I expect after the procedure?  After the procedure, it is common to have:  Bruising that usually fades within 1-2 weeks.  Tenderness at the site. Follow these instructions at home: Wound care 1. Follow instructions from your health care provider about how to take care of your insertion site. Make sure you: ? Wash your hands with soap and water before you change your bandage (dressing). If soap and water are not available, use hand sanitizer. ? Remove your dressing as told by your health care provider. In 24 hours 2. Do not take baths, swim, or use a hot tub until your health care provider approves. 3. You may shower 24-48 hours after the procedure or as told by your health care provider. ? Gently wash the site with plain soap and water. ? Pat the area dry with a clean towel. ? Do not rub the site. This may cause bleeding. 4. Do not apply powder or lotion to the site. Keep the site clean and dry. 5. Check your femoral site every day for signs of infection. Check for: ? Redness, swelling, or pain. ? Fluid or blood. ? Warmth. ? Pus or a bad smell. Activity 1. For the first 2-3 days after your procedure, or as long as directed: ? Avoid climbing stairs as much as possible. ? Do not squat. 2. Do not lift anything that is heavier than 10 lb (4.5 kg), or the limit that you are told, until your health care provider says that it is safe. For 5 days 3. Rest as directed. ? Avoid sitting for a long time without moving. Get up to take short walks every 1-2 hours. 4. Do not drive for 24 hours if you were given a medicine to help you relax (sedative). General instructions  Take over-the-counter and prescription medicines only as told by your  health care provider.  Keep all follow-up visits as told by your health care provider. This is important. Contact a health care provider if you have:  A fever or chills.  You have redness, swelling, or pain around your insertion site. Get help right away if:  The catheter insertion area swells very fast.  You pass out.  You suddenly start to sweat or your skin gets clammy.  The catheter insertion area is bleeding, and the bleeding does not stop when you hold steady pressure on the area.  The area near or just beyond the catheter insertion site becomes pale, cool, tingly, or numb. These symptoms may represent a serious problem that is an emergency. Do not wait to see if the symptoms will go away. Get medical help right away. Call your local emergency services (911 in the [place] Do not drive yourself to the hospital. Summary  After the procedure, it is common to have bruising that usually fades within 1-2 weeks.  Check your femoral site every day for signs of infection.  Do not lift anything that is heavier than 10 lb (4.5 kg), or the limit that you are told, until your health care provider says that it is safe. This information is not intended to replace advice given to you by your health care provider. Make sure you discuss any questions you have with your health care provider. Document Revised: 10/13/2017 Document Reviewed: 10/13/2017   Elsevier Patient Education  2020 Elsevier Inc.   

## 2021-03-16 NOTE — Op Note (Signed)
   PATIENT: [name]      MRN: [id number] DOB: [birth year]    DATE OF PROCEDURE: 03/16/2021  INDICATIONS:    [name] is a 87 y.o. male with combined peripheral vascular disease and chronic venous insufficiency.  He was having worsening pain in the left foot and wished to proceed with arteriography.  PROCEDURE:    1.  Ultrasound-guided access to the right common femoral artery 2.  Aortogram with bilateral iliac arteriogram and bilateral lower extremity runoff 3.  Mynx closure of right common femoral artery  SURGEON: Judeth Cornfield. Scot Dock, MD, FACS  ANESTHESIA: Local  EBL: Minimal  TECHNIQUE: The patient was brought to the peripheral vascular lab. Both groins were prepped and draped in the usual sterile fashion.  Under ultrasound guidance, after the skin was anesthetized, I cannulated the right common femoral artery with a micropuncture needle and a micropuncture sheath was introduced over a wire.  This was exchanged for a 5 [place] sheath over a Bentson wire.  By ultrasound the femoral artery was patent. A real-time image was obtained and sent to the server.  A pigtail catheter was positioned at the L1 vertebral body.  Flush aortogram was obtained.  The catheter was in position above the aortic bifurcation.  Bilateral lower extremity runoff films were obtained.  In addition some oblique projections were obtained.  At the completion of the procedure the pigtail catheter was removed over a wire.  The wire was removed.  The right femoral artery was closed using the Mynx device and there was excellent hemostasis.  The patient was transferred to the holding area.  No immediate complications were noted.  FINDINGS:   1. Single renal arteries bilaterally with no significant renal artery stenosis.  The infrarenal aorta is widely patent. 2.  On the left side, which is the more symptomatic side, the left common iliac artery stent is widely patent.  The external iliac and hypogastric artery are  patent.  The common femoral, deep femoral, and proximal superficial femoral artery are patent.  There is moderate disease in the mid SFA and then occlusion of the popliteal artery at the level of the knee.  There is reconstitution of the peroneal artery and anterior tibial artery only.  The posterior tibial artery is occluded.  The anterior tibial artery appears to be occluded distally. 3.  On the right side, the common iliac, external iliac, and hypogastric arteries are patent.  The common femoral, deep femoral, and proximal superficial femoral arteries are patent.  There is moderate disease of the mid SFA.  There is occlusion of the distal SFA and popliteal artery with reconstitution of the posterior tibial artery only on the right.  The anterior tibial and peroneal arteries are occluded.  CLINICAL NOTE: This patient has severe infrainguinal arterial occlusive disease.  He is 87 years old with coronary artery disease, obesity, congestive heart failure, and COPD.  He is not a candidate for open surgery.  He was not a candidate for an endovascular approach.  Fortunately, currently he has no wounds on his feet.  He will keep his regularly scheduled appointment in 3 months.  They will call sooner if there are problems.  [name], MD, FACS [facility] DICTATION:   03/16/2021

## 2021-03-19 ENCOUNTER — Encounter (HOSPITAL_COMMUNITY): Payer: Self-pay | Admitting: Vascular Surgery

## 2021-03-21 ENCOUNTER — Other Ambulatory Visit (HOSPITAL_COMMUNITY): Payer: Self-pay | Admitting: Internal Medicine

## 2021-04-06 DIAGNOSIS — J441 Chronic obstructive pulmonary disease with (acute) exacerbation: Secondary | ICD-10-CM | POA: Diagnosis not present

## 2021-04-09 NOTE — Progress Notes (Signed)
Advanced Heart Failure Clinic Note    PCP: [name], MD PCP-Cardiologist: Fransico Him, MD  HF Cardiologist: Dr. Haroldine Laws  HPI:  [name], [name] is an 87 yo male with HFpEF, COPD has PRN oxygen at home for exertion, DM2, HTN, HLD, CAD s/p DES x4, hypothyroidism, PAD s/p LLE stent, severe OSA, diverticulitis w/ bleeding, GERD. Referred by Dr. Brigitte Pulse for further evaluation of LE edema.   Followed by Dr. Chase Caller for COPD.  PFTs 05/20/18- FVC 1.63 (60%), FEV1  0.77 (42%), Ratio 48 (65%)  HRCT 05/11/18 No findings to suggest ILD. Mild diffuse bronchial wall thickening with mild centrilobular and paraseptal emphysema.   Had PFT's 11/2020 with stable severe irway obstruction.  Had repeat ECHO on 09/2020 with LVEF 55-60%, G1DD, no valvular pathology, Normal RV function and pressure, No TR  Personally reviewed  Seen in 2/22 for first visit due to LE edema. Felt to be most likely related to venous insufficiency. U/s negative for DVT. Referred to [facility].   He had f/u 03/01/21 with his daughter. Says his legs continue to swell L > R. Has not been taking his torsemide for unclear reasons. Saw Dr. Scot Dock in [facility] and felt to have combined arterial and venous disease.    Underwent abdominal aortogram and LE 03/16/21 with Dr. Scot Dock. He has severe infrainguinal arterial occlusive disease. He is not a candidate for open surgery or endovascular approach.  Today he returns to clinic for worsening wheezing and SOB, with his daughter. She has noticed he is more SOB over the past 2 weeks and has put him on 2L oxygen continuously. Has had to give breathing treatments 4 times a day, still using daily inhalers.  Not taking torsemide at all now. [name] says he feels breathing is about the same. Denies increasing SOB, CP, dizziness, edema, or PND/Orthopnea. Appetite ok. No fever or chills. Does not check weight at home. He does not wear his CPAP.  Past Medical History:  Diagnosis Date   Anxiety    Asthma     Chronic diastolic CHF (congestive heart failure) (HCC)    diastolic    COPD (chronic obstructive pulmonary disease) ([place])    Coronary artery disease    s/p PCI of RCA 03/2009 at which time there was 70% ramus and 90% RCA, cath 01/2011 showed patent stents in RCA with moderate prox disesae, aneurysmal left circ and small 90% ramus s/p PCI, patent LAD EF 55%   Diabetes mellitus    Diverticulitis Oct. 2013   bleeding in the past and Effient for his CAD was stopped   DVT (deep venous thrombosis) (HCC)    GERD (gastroesophageal reflux disease)    Hypercholesterolemia    Hypertension    Hypothyroidism    Obesity (BMI 30-39.9)    OSA (obstructive sleep apnea)    severe on CPAP   Peripheral vascular disease ([place]) 11/2006   s/p left stent   Shortness of breath    Sleep apnea    severe OSA awaiting CPAP titration    Current Outpatient Medications  Medication Sig Dispense Refill   albuterol (PROVENTIL HFA;VENTOLIN HFA) 108 (90 BASE) MCG/ACT inhaler Inhale 2 puffs into the lungs every 6 (six) hours as needed for wheezing or shortness of breath.      albuterol (PROVENTIL) (2.5 MG/3ML) 0.083% nebulizer solution Take 3 mLs (2.5 mg total) by nebulization every 4 (four) hours as needed for wheezing or shortness of breath. 75 mL 0   aspirin EC 81 MG tablet Take 81 mg  by mouth daily.     Budeson-Glycopyrrol-Formoterol (BREZTRI AEROSPHERE) 160-9-4.8 MCG/ACT AERO Inhale 2 puffs into the lungs in the morning and at bedtime.     ezetimibe (ZETIA) 10 MG tablet Take 1 tablet by mouth once daily 60 tablet 1   glimepiride (AMARYL) 2 MG tablet Take 2 mg by mouth every evening.      ipratropium-albuterol (DUONEB) 0.5-2.5 (3) MG/3ML SOLN Take 3 mLs by nebulization every 4 (four) hours as needed (asthma).     levothyroxine (SYNTHROID, LEVOTHROID) 150 MCG tablet Take 150 mcg by mouth every evening.      meloxicam (MOBIC) 15 MG tablet Take 15 mg by mouth daily as needed for pain.     metoprolol succinate (TOPROL-XL)  50 MG 24 hr tablet Take 1 tablet by mouth once daily 60 tablet 1   nitroGLYCERIN (NITROSTAT) 0.4 MG SL tablet Place 1 tablet (0.4 mg total) under the tongue every 5 (five) minutes as needed for chest pain. 25 tablet 6   rosuvastatin (CRESTOR) 20 MG tablet Take 1 tablet (20 mg total) by mouth daily. 90 tablet 3   No current facility-administered medications for this encounter.    Allergies  Allergen Reactions   Clopidogrel Itching   Social History   Socioeconomic History   Marital status: Widowed    Spouse name: Not on file   Number of children: Not on file   Years of education: Not on file   Highest education level: Not on file  Occupational History   Not on file  Tobacco Use   Smoking status: Former    Packs/day: 0.50    Years: 15.00    Pack years: 7.50    Types: Cigarettes, Cigars    Quit date: 10/15/1963    Years since quitting: 57.5   Smokeless tobacco: Never  Vaping Use   Vaping Use: Never used  Substance and Sexual Activity   Alcohol use: No   Drug use: No   Sexual activity: Never  Other Topics Concern   Not on file  Social History Narrative   Not on file   Social Determinants of Health   Financial Resource Strain: Not on file  Food Insecurity: Not on file  Transportation Needs: Not on file  Physical Activity: Not on file  Stress: Not on file  Social Connections: Not on file  Intimate Partner Violence: Not on file   Family History  Problem Relation Age of Onset   Diabetes Mother    Hyperlipidemia Mother    Hypertension Mother    Heart attack Mother    Heart disease Mother        before age 21   Diabetes Brother    Heart disease Father        before age 32   Breast cancer Sister    Diabetes Sister    Hyperlipidemia Sister    Hypertension Sister    Heart attack Sister    Hyperlipidemia Sister    Hypertension Sister    Heart disease Sister        Heart Disease before age 59   Asthma Sister    Malignant hyperthermia Neg Hx    BP 130/79    Pulse 77   Wt 90.2 kg (198 lb 12.8 oz)   SpO2 98%   BMI 34.12 kg/m   Wt Readings from Last 3 Encounters:  04/10/21 90.2 kg (198 lb 12.8 oz)  03/16/21 89.8 kg (198 lb)  03/01/21 90.6 kg (199 lb 12.8 oz)   PHYSICAL EXAM:  General:  NAD. No resp difficulty, on oxygen, arrived in wheelchair HEENT: Normal Neck: Supple. No JVD. Carotids 2+ bilat; no bruits. No lymphadenopathy or thryomegaly appreciated. Cor: PMI nondisplaced. Regular rate & rhythm. No rubs, gallops or murmurs. Lungs: Distant BS, faint wheezes upper lobes. Abdomen: Soft, nontender, nondistended. No hepatosplenomegaly. No bruits or masses. Good bowel sounds. Extremities: No cyanosis, clubbing, rash, +2 lymphedema L>R Neuro: Alert & oriented x 3, cranial nerves grossly intact. Moves all 4 extremities w/o difficulty. Affect pleasant.  REDs: 32%  ASSESSMENT & PLAN:  1. LE edema - REDS lung water is normal 32% suggest this is not likely predominant left heart failure currently  - Given severe COPD, is at risk for PAH and RV failure, but echo not suggestive of this. - Given assymmetric edema and presence of LE varicosities suspect this is primarily venous insufficiency. - He has seen Dr. Scot Dock in [facility] who feels a combination of severe PAD and venous insufficiency. Options limited and [name]. [place] does not want to pursue any of them.  - I have suggested very light compression, mild elevation. He does not wear compression socks. - He needs to take torsemide 40 mg daily x 2 days, then at least 2-3x/week thereafter. - BMET today.  2. COPD, severe - Likely main source of his dyspnea along with deconditioning. - PFT's with stable obstructive disease, no DLCO values available. - Followed by Dr. Chase Caller. - Continue triple therapy inhalers and nebs.  - CXR today.   3. OSA not on CPAP: - Says he will not wear his CPAP.  4. CAD s/p DES x4: - No s/s angina. - Continue asa, zetia, Crestor.  5. PAD: - Follows with Dr.  Scot Dock.  - Personally discussed with Dr. Haroldine Laws, he needs to follow back with Pulmonary concerning COPD and new requirement of daily oxygen.   - Will send for CXR today, but breathing issues likely due to on-going, severe COPD, not HF. Does not appear to be in exacerbation today.  - He can follow up with Dr. Radford Pax and we will be happy to see him on an as needed basis.   [place], FNP  04/10/21

## 2021-04-10 ENCOUNTER — Ambulatory Visit (HOSPITAL_COMMUNITY)
Admission: RE | Admit: 2021-04-10 | Discharge: 2021-04-10 | Disposition: A | Discharge status: Home / Self Care | Payer: Medicare HMO | Source: Ambulatory Visit | Attending: Family Medicine | Admitting: Family Medicine

## 2021-04-10 ENCOUNTER — Encounter (HOSPITAL_COMMUNITY): Payer: Self-pay

## 2021-04-10 ENCOUNTER — Other Ambulatory Visit: Payer: Self-pay

## 2021-04-10 VITALS — BP 130/79 | HR 77 | Wt 198.8 lb

## 2021-04-10 DIAGNOSIS — I2510 Atherosclerotic heart disease of native coronary artery without angina pectoris: Secondary | ICD-10-CM

## 2021-04-10 DIAGNOSIS — Z7984 Long term (current) use of oral hypoglycemic drugs: Secondary | ICD-10-CM | POA: Diagnosis not present

## 2021-04-10 DIAGNOSIS — R0602 Shortness of breath: Secondary | ICD-10-CM | POA: Insufficient documentation

## 2021-04-10 DIAGNOSIS — Z87891 Personal history of nicotine dependence: Secondary | ICD-10-CM | POA: Insufficient documentation

## 2021-04-10 DIAGNOSIS — Z9981 Dependence on supplemental oxygen: Secondary | ICD-10-CM | POA: Diagnosis not present

## 2021-04-10 DIAGNOSIS — G4733 Obstructive sleep apnea (adult) (pediatric): Secondary | ICD-10-CM

## 2021-04-10 DIAGNOSIS — Z8249 Family history of ischemic heart disease and other diseases of the circulatory system: Secondary | ICD-10-CM | POA: Insufficient documentation

## 2021-04-10 DIAGNOSIS — Z79899 Other long term (current) drug therapy: Secondary | ICD-10-CM | POA: Insufficient documentation

## 2021-04-10 DIAGNOSIS — Z955 Presence of coronary angioplasty implant and graft: Secondary | ICD-10-CM | POA: Insufficient documentation

## 2021-04-10 DIAGNOSIS — Z7982 Long term (current) use of aspirin: Secondary | ICD-10-CM | POA: Insufficient documentation

## 2021-04-10 DIAGNOSIS — E1151 Type 2 diabetes mellitus with diabetic peripheral angiopathy without gangrene: Secondary | ICD-10-CM | POA: Insufficient documentation

## 2021-04-10 DIAGNOSIS — I5032 Chronic diastolic (congestive) heart failure: Secondary | ICD-10-CM

## 2021-04-10 DIAGNOSIS — R600 Localized edema: Secondary | ICD-10-CM

## 2021-04-10 DIAGNOSIS — J449 Chronic obstructive pulmonary disease, unspecified: Secondary | ICD-10-CM

## 2021-04-10 DIAGNOSIS — I110 Hypertensive heart disease with heart failure: Secondary | ICD-10-CM | POA: Insufficient documentation

## 2021-04-10 DIAGNOSIS — Z888 Allergy status to other drugs, medicaments and biological substances status: Secondary | ICD-10-CM | POA: Insufficient documentation

## 2021-04-10 DIAGNOSIS — Z833 Family history of diabetes mellitus: Secondary | ICD-10-CM | POA: Diagnosis not present

## 2021-04-10 DIAGNOSIS — E039 Hypothyroidism, unspecified: Secondary | ICD-10-CM | POA: Insufficient documentation

## 2021-04-10 DIAGNOSIS — Z79890 Hormone replacement therapy (postmenopausal): Secondary | ICD-10-CM | POA: Diagnosis not present

## 2021-04-10 DIAGNOSIS — I739 Peripheral vascular disease, unspecified: Secondary | ICD-10-CM

## 2021-04-10 DIAGNOSIS — J439 Emphysema, unspecified: Secondary | ICD-10-CM | POA: Diagnosis not present

## 2021-04-10 LAB — BASIC METABOLIC PANEL
Anion gap: 5 (ref 5–15)
BUN: 11 mg/dL (ref 8–23)
CO2: 33 mmol/L — ABNORMAL HIGH (ref 22–32)
Calcium: 9.4 mg/dL (ref 8.9–10.3)
Chloride: 103 mmol/L (ref 98–111)
Creatinine, Ser: 1.04 mg/dL (ref 0.61–1.24)
GFR, Estimated: >60 mL/min (ref >60)
Glucose, Bld: 89 mg/dL (ref 70–99)
Potassium: 4.3 mmol/L (ref 3.5–5.1)
Sodium: 141 mmol/L (ref 135–145)

## 2021-04-10 NOTE — Progress Notes (Signed)
ReDS Vest / Clip - 04/10/21 1200       ReDS Vest / Clip   Station Marker A    Ruler Value 28    ReDS Value Range Low volume    ReDS Actual Value 32

## 2021-04-10 NOTE — Patient Instructions (Signed)
TAKE Torsemide 40 g daily for 2 days, then resume normal dose of 2-3 times per week  Labs today We will only contact you if something comes back abnormal or we need to make some changes. Otherwise no news is good news!  Be sure to follow up with pulmonology    Be sure to follow up with Dr Johnsie Cancel  A chest x-ray takes a picture of the organs and structures inside the chest, including the heart, lungs, and blood vessels. This test can show several things, including, whether the heart is enlarges; whether fluid is building up in the lungs; and whether pacemaker / defibrillator leads are still in place.  At the [facility], you and your health needs are our priority. As part of our continuing mission to provide you with exceptional heart care, we have created designated Provider Care Teams. These Care Teams include your primary Cardiologist (physician) and Advanced Practice Providers (APPs- Physician Assistants and Nurse Practitioners) who all work together to provide you with the care you need, when you need it.   You may see any of the following providers on your designated Care Team at your next follow up: Dr Glori Bickers Dr Loralie Champagne Dr Patrice Paradise, NP Lyda Jester, [place] Ginnie Smart Audry Riles, PharmD   Please be sure to bring in all your medications bottles to every appointment.

## 2021-05-06 DIAGNOSIS — J441 Chronic obstructive pulmonary disease with (acute) exacerbation: Secondary | ICD-10-CM | POA: Diagnosis not present

## 2021-06-02 ENCOUNTER — Encounter (HOSPITAL_COMMUNITY): Payer: Self-pay | Admitting: Emergency Medicine

## 2021-06-02 ENCOUNTER — Emergency Department (HOSPITAL_COMMUNITY): Payer: Medicare HMO

## 2021-06-02 ENCOUNTER — Emergency Department (HOSPITAL_COMMUNITY)
Admission: EM | Admit: 2021-06-02 | Discharge: 2021-06-02 | Disposition: A | Discharge status: Home / Self Care | Payer: Medicare HMO | Attending: Emergency Medicine | Admitting: Emergency Medicine

## 2021-06-02 ENCOUNTER — Other Ambulatory Visit: Payer: Self-pay

## 2021-06-02 DIAGNOSIS — H9313 Tinnitus, bilateral: Secondary | ICD-10-CM | POA: Diagnosis not present

## 2021-06-02 DIAGNOSIS — Z7984 Long term (current) use of oral hypoglycemic drugs: Secondary | ICD-10-CM | POA: Insufficient documentation

## 2021-06-02 DIAGNOSIS — E039 Hypothyroidism, unspecified: Secondary | ICD-10-CM | POA: Insufficient documentation

## 2021-06-02 DIAGNOSIS — R443 Hallucinations, unspecified: Secondary | ICD-10-CM | POA: Insufficient documentation

## 2021-06-02 DIAGNOSIS — J441 Chronic obstructive pulmonary disease with (acute) exacerbation: Secondary | ICD-10-CM | POA: Insufficient documentation

## 2021-06-02 DIAGNOSIS — R4182 Altered mental status, unspecified: Secondary | ICD-10-CM | POA: Diagnosis not present

## 2021-06-02 DIAGNOSIS — Z7951 Long term (current) use of inhaled steroids: Secondary | ICD-10-CM | POA: Diagnosis not present

## 2021-06-02 DIAGNOSIS — H6123 Impacted cerumen, bilateral: Secondary | ICD-10-CM | POA: Diagnosis not present

## 2021-06-02 DIAGNOSIS — R413 Other amnesia: Secondary | ICD-10-CM | POA: Diagnosis not present

## 2021-06-02 DIAGNOSIS — Z79899 Other long term (current) drug therapy: Secondary | ICD-10-CM | POA: Diagnosis not present

## 2021-06-02 DIAGNOSIS — I5032 Chronic diastolic (congestive) heart failure: Secondary | ICD-10-CM | POA: Insufficient documentation

## 2021-06-02 DIAGNOSIS — R6889 Other general symptoms and signs: Secondary | ICD-10-CM

## 2021-06-02 DIAGNOSIS — Z87891 Personal history of nicotine dependence: Secondary | ICD-10-CM | POA: Insufficient documentation

## 2021-06-02 DIAGNOSIS — E119 Type 2 diabetes mellitus without complications: Secondary | ICD-10-CM | POA: Diagnosis not present

## 2021-06-02 DIAGNOSIS — I110 Hypertensive heart disease with heart failure: Secondary | ICD-10-CM | POA: Insufficient documentation

## 2021-06-02 DIAGNOSIS — J45909 Unspecified asthma, uncomplicated: Secondary | ICD-10-CM | POA: Insufficient documentation

## 2021-06-02 DIAGNOSIS — Z7982 Long term (current) use of aspirin: Secondary | ICD-10-CM | POA: Insufficient documentation

## 2021-06-02 DIAGNOSIS — Y900 Blood alcohol level of less than 20 mg/100 ml: Secondary | ICD-10-CM | POA: Diagnosis not present

## 2021-06-02 DIAGNOSIS — I10 Essential (primary) hypertension: Secondary | ICD-10-CM | POA: Diagnosis not present

## 2021-06-02 LAB — CBC WITH DIFFERENTIAL/PLATELET
Abs Immature Granulocytes: 0.03 10*3/uL (ref 0.00–0.07)
Basophils Absolute: 0.1 10*3/uL (ref 0.0–0.1)
Basophils Relative: 1 %
Eosinophils Absolute: 0.3 10*3/uL (ref 0.0–0.5)
Eosinophils Relative: 3 %
HCT: 47.3 % (ref 39.0–52.0)
Hemoglobin: 15.1 g/dL (ref 13.0–17.0)
Immature Granulocytes: 0 %
Lymphocytes Relative: 29 %
Lymphs Abs: 2.4 10*3/uL (ref 0.7–4.0)
MCH: 29.3 pg (ref 26.0–34.0)
MCHC: 31.9 g/dL (ref 30.0–36.0)
MCV: 91.7 fL (ref 80.0–100.0)
Monocytes Absolute: 0.7 10*3/uL (ref 0.1–1.0)
Monocytes Relative: 8 %
Neutro Abs: 4.9 10*3/uL (ref 1.7–7.7)
Neutrophils Relative %: 59 %
Platelets: 124 10*3/uL — ABNORMAL LOW (ref 150–400)
RBC: 5.16 MIL/uL (ref 4.22–5.81)
RDW: 13.1 % (ref 11.5–15.5)
WBC: 8.4 10*3/uL (ref 4.0–10.5)
nRBC: 0 % (ref 0.0–0.2)

## 2021-06-02 LAB — COMPREHENSIVE METABOLIC PANEL
ALT: 17 U/L (ref 0–44)
AST: 24 U/L (ref 15–41)
Albumin: 4.4 g/dL (ref 3.5–5.0)
Alkaline Phosphatase: 47 U/L (ref 38–126)
Anion gap: 9 (ref 5–15)
BUN: 15 mg/dL (ref 8–23)
CO2: 29 mmol/L (ref 22–32)
Calcium: 9.8 mg/dL (ref 8.9–10.3)
Chloride: 102 mmol/L (ref 98–111)
Creatinine, Ser: 1.06 mg/dL (ref 0.61–1.24)
GFR, Estimated: >60 mL/min (ref >60)
Glucose, Bld: 105 mg/dL — ABNORMAL HIGH (ref 70–99)
Potassium: 3.9 mmol/L (ref 3.5–5.1)
Sodium: 140 mmol/L (ref 135–145)
Total Bilirubin: 0.7 mg/dL (ref 0.3–1.2)
Total Protein: 7.7 g/dL (ref 6.5–8.1)

## 2021-06-02 LAB — SALICYLATE LEVEL: Salicylate Lvl: <7 mg/dL — ABNORMAL LOW (ref 7.0–30.0)

## 2021-06-02 LAB — RAPID URINE DRUG SCREEN, HOSP PERFORMED
Amphetamines: NOT DETECTED
Barbiturates: NOT DETECTED
Benzodiazepines: NOT DETECTED
Cocaine: NOT DETECTED
Opiates: NOT DETECTED
Tetrahydrocannabinol: NOT DETECTED

## 2021-06-02 LAB — TROPONIN I (HIGH SENSITIVITY)
Troponin I (High Sensitivity): 4 ng/L (ref <18)
Troponin I (High Sensitivity): 4 ng/L (ref <18)

## 2021-06-02 LAB — BLOOD GAS, VENOUS
Acid-Base Excess: 4.3 mmol/L — ABNORMAL HIGH (ref 0.0–2.0)
Bicarbonate: 32.3 mmol/L — ABNORMAL HIGH (ref 20.0–28.0)
O2 Saturation: 45.5 %
Patient temperature: 37
pCO2, Ven: 66.8 mmHg — ABNORMAL HIGH (ref 44.0–60.0)
pH, Ven: 7.306 (ref 7.250–7.430)
pO2, Ven: 30.8 mmHg — CL (ref 32.0–45.0)

## 2021-06-02 LAB — URINALYSIS, ROUTINE W REFLEX MICROSCOPIC
Bilirubin Urine: NEGATIVE
Glucose, UA: NEGATIVE mg/dL
Hgb urine dipstick: NEGATIVE
Ketones, ur: NEGATIVE mg/dL
Leukocytes,Ua: NEGATIVE
Nitrite: NEGATIVE
Protein, ur: NEGATIVE mg/dL
Specific Gravity, Urine: 1.012 (ref 1.005–1.030)
pH: 5 (ref 5.0–8.0)

## 2021-06-02 LAB — ETHANOL: Alcohol, Ethyl (B): <10 mg/dL (ref <10)

## 2021-06-02 LAB — TSH: TSH: 0.414 u[IU]/mL (ref 0.350–4.500)

## 2021-06-02 MED ORDER — GADOBUTROL 1 MMOL/ML IV SOLN
8.0000 mL | Freq: Once | INTRAVENOUS | Status: AC | PRN
  Administered 2021-06-02: 8 mL via INTRAVENOUS

## 2021-06-02 MED ORDER — IPRATROPIUM-ALBUTEROL 0.5-2.5 (3) MG/3ML IN SOLN
3.0000 mL | Freq: Once | RESPIRATORY_TRACT | Status: AC
  Administered 2021-06-02: 3 mL via RESPIRATORY_TRACT
  Filled 2021-06-02: qty 3

## 2021-06-02 NOTE — ED Triage Notes (Signed)
Pt reports ringing and crickets in his ears for the last 3 hours. Denies pain or hx of hearing problems. Daughter also reports that he has been seeing things and talking to people who are not there recently. She lives with and takes care of patient.

## 2021-06-02 NOTE — Discharge Instructions (Addendum)
Your have earwax buildup in your ears. A portion of it was removed today but more will need to be removed in clinic with an ENT specialist.

## 2021-06-02 NOTE — ED Notes (Signed)
Pt able to ambulate with no assistance

## 2021-06-02 NOTE — ED Provider Notes (Addendum)
[facility] DEPT Provider Note   CSN: [id number] Arrival date & time: 06/02/21  0551     History Chief Complaint  Patient presents with   Tinnitus   Hallucinations    [name] is a 87 y.o. male.  HPI    87 yo male with HFpEF, COPD has PRN oxygen at home for exertion, DM2, HTN, HLD, CAD s/p DES x4, hypothyroidism, PAD s/p LLE stent, severe OSA, diverticulitis w/ bleeding, GERD who presents to the emergency department with tenderness and change in mental status.  For the past 3 hours, the patient has complained of ringing in his ears.  He denies any specific hearing loss that he is aware of.  His wife is also concerned for progression of dementia.  He has had increasing forgetfulness.  Last night, he had an episode of hallucinating, thinking there were men in a house out to get him.  He was looking for a firearm.  His wife states that he has had increasing intermittent episodes of this more recently.  She has not yet discussed this with his PCP.  Patient currently denies any headaches, fevers, vision changes, neck pain or stiffness.  He denies any facial droop, dysphagia or dysarthria, focal weakness or numbness.  Past Medical History:  Diagnosis Date   Anxiety    Asthma    Chronic diastolic CHF (congestive heart failure) (HCC)    diastolic    COPD (chronic obstructive pulmonary disease) ([place])    Coronary artery disease    s/p PCI of RCA 03/2009 at which time there was 70% ramus and 90% RCA, cath 01/2011 showed patent stents in RCA with moderate prox disesae, aneurysmal left circ and small 90% ramus s/p PCI, patent LAD EF 55%   Diabetes mellitus    Diverticulitis Oct. 2013   bleeding in the past and Effient for his CAD was stopped   DVT (deep venous thrombosis) (HCC)    GERD (gastroesophageal reflux disease)    Hypercholesterolemia    Hypertension    Hypothyroidism    Obesity (BMI 30-39.9)    OSA (obstructive sleep apnea)    severe on CPAP    Peripheral vascular disease ([place]) 11/2006   s/p left stent   Shortness of breath    Sleep apnea    severe OSA awaiting CPAP titration    Patient Active Problem List   Diagnosis Date Noted   Sepsis ([place]) 04/01/2019   Acute lower GI bleeding 09/03/2018   Diabetes mellitus type II, non insulin dependent ([place]) 09/03/2018   Hyperkalemia 09/03/2018   Hypertensive urgency    COPD (chronic obstructive pulmonary disease) ([place]) 11/14/2017   Influenza A 11/13/2017   Mild renal insufficiency    LLL pneumonia 05/22/2017   History of DVT of lower extremity 05/22/2017   COPD exacerbation ([place]) 09/22/2016   SIRS (systemic inflammatory response syndrome) ([place]) 09/22/2016   Dyspnea 11/16/2014   Hypoxia 11/16/2014   Aftercare following surgery of the circulatory system 08/03/2014   Chronic diastolic CHF (congestive heart failure) ([place]) 06/21/2014   Edema of extremities 06/21/2014   COPD GOLD III 12/27/2013   Coronary artery disease    OSA (obstructive sleep apnea)    Obesity (BMI 30-39.9)    Weakness of left leg 11/20/2012   Left thigh pain 11/20/2012   PVD (peripheral vascular disease) ([place]) 04/01/2012   WEIGHT GAIN, ABNORMAL 12/07/2009   Dyslipidemia 11/21/2009   HYPERTENSION, BENIGN 11/21/2009   DEEP VENOUS THROMBOPHLEBITIS, LEFT, LEG, HX OF 11/21/2009  Past Surgical History:  Procedure Laterality Date   ABDOMINAL AORTAGRAM N/A 04/20/2012   Procedure: ABDOMINAL Maxcine Ham;  Surgeon: Angelia Mould, MD;  Location[facility] CATH LAB;  Service: Cardiovascular;  Laterality: N/A;   ABDOMINAL AORTAGRAM N/A 12/29/2012   Procedure: ABDOMINAL Maxcine Ham;  Surgeon: Serafina Mitchell, MD;  Location: [facility] CATH LAB;  Service: Cardiovascular;  Laterality: N/A;   ABDOMINAL AORTOGRAM W/LOWER EXTREMITY Bilateral 03/16/2021   Procedure: ABDOMINAL AORTOGRAM W/LOWER EXTREMITY;  Surgeon: Angelia Mould, MD;  Location: [facility] CV LAB;  Service: Cardiovascular;  Laterality: Bilateral;   BALLOON DILATION   12/26/2011   Procedure: BALLOON DILATION;  Surgeon: Garlan Fair, MD;  Location: [facility] ENDOSCOPY;  Service: Endoscopy;  Laterality: N/A;   COLONOSCOPY  08/14/2012   Procedure: COLONOSCOPY;  Surgeon: Arta Silence, MD;  Location: [facility] ENDOSCOPY;  Service: Endoscopy;  Laterality: N/A;   CORONARY STENT PLACEMENT  2010 and 2012   ESOPHAGOGASTRODUODENOSCOPY  12/26/2011   Procedure: ESOPHAGOGASTRODUODENOSCOPY (EGD);  Surgeon: Garlan Fair, MD;  Location: [facility] ENDOSCOPY;  Service: Endoscopy;  Laterality: N/A;   ESOPHAGOGASTRODUODENOSCOPY  08/13/2012   Procedure: ESOPHAGOGASTRODUODENOSCOPY (EGD);  Surgeon: Arta Silence, MD;  Location: [facility] ENDOSCOPY;  Service: Endoscopy;  Laterality: Left;   HERNIA REPAIR  1992   evntral hernia repair   LOWER EXTREMITY ANGIOGRAM Bilateral 12/29/2012   Procedure: LOWER EXTREMITY ANGIOGRAM;  Surgeon: Serafina Mitchell, MD;  Location[facility] CATH LAB;  Service: Cardiovascular;  Laterality: Bilateral;  bilat lower extrem angio       Family History  Problem Relation Age of Onset   Diabetes Mother    Hyperlipidemia Mother    Hypertension Mother    Heart attack Mother    Heart disease Mother        before age 22   Diabetes Brother    Heart disease Father        before age 96   Breast cancer Sister    Diabetes Sister    Hyperlipidemia Sister    Hypertension Sister    Heart attack Sister    Hyperlipidemia Sister    Hypertension Sister    Heart disease Sister        Heart Disease before age 79   Asthma Sister    Malignant hyperthermia Neg Hx     Social History   Tobacco Use   Smoking status: Former    Packs/day: 0.50    Years: 15.00    Pack years: 7.50    Types: Cigarettes, Cigars    Quit date: 10/15/1963    Years since quitting: 58.6   Smokeless tobacco: Never  Vaping Use   Vaping Use: Never used  Substance Use Topics   Alcohol use: No   Drug use: No    Home Medications Prior to Admission medications   Medication Sig Start Date End Date Taking?  Authorizing Provider  albuterol (PROVENTIL HFA;VENTOLIN HFA) 108 (90 BASE) MCG/ACT inhaler Inhale 2 puffs into the lungs every 6 (six) hours as needed for wheezing or shortness of breath.    Yes [provider]  albuterol (PROVENTIL) (2.5 MG/3ML) 0.083% nebulizer solution Take 3 mLs (2.5 mg total) by nebulization every 4 (four) hours as needed for wheezing or shortness of breath. 09/25/16  Yes Patrecia Pour, MD  aspirin EC 81 MG tablet Take 81 mg by mouth daily.   Yes [provider]  Budeson-Glycopyrrol-Formoterol (BREZTRI AEROSPHERE) 160-9-4.8 MCG/ACT AERO Inhale 2 puffs into the lungs in the morning and at bedtime.   Yes [provider]  ezetimibe (ZETIA) 10 MG tablet Take 1 tablet by mouth once daily 06/13/20  Yes Turner, Traci R, MD  glimepiride (AMARYL) 2 MG tablet Take 2 mg by mouth every evening.    Yes [provider]  ipratropium-albuterol (DUONEB) 0.5-2.5 (3) MG/3ML SOLN Take 3 mLs by nebulization every 4 (four) hours as needed (asthma).   Yes [provider]  levothyroxine (SYNTHROID, LEVOTHROID) 150 MCG tablet Take 150 mcg by mouth every evening.  05/19/17  Yes [provider]  meloxicam (MOBIC) 15 MG tablet Take 15 mg by mouth daily as needed for pain. 03/30/19  Yes [provider]  metoprolol succinate (TOPROL-XL) 50 MG 24 hr tablet Take 1 tablet by mouth once daily 06/13/20  Yes Turner, Eber Hong, MD  nitroGLYCERIN (NITROSTAT) 0.4 MG SL tablet Place 1 tablet (0.4 mg total) under the tongue every 5 (five) minutes as needed for chest pain. 07/06/20  Yes Turner, Eber Hong, MD  rosuvastatin (CRESTOR) 20 MG tablet Take 1 tablet (20 mg total) by mouth daily. 07/26/20  Yes Sueanne Margarita, MD    Allergies    Clopidogrel  Review of Systems   Review of Systems  Constitutional:  Negative for chills and fever.  HENT:  Positive for tinnitus. Negative for ear pain, hearing loss and sore throat.   Eyes:  Negative for pain and visual  disturbance.  Respiratory:  Negative for cough and shortness of breath.   Cardiovascular:  Negative for chest pain and palpitations.  Gastrointestinal:  Negative for abdominal pain and vomiting.  Genitourinary:  Negative for dysuria and hematuria.  Musculoskeletal:  Negative for arthralgias and back pain.  Skin:  Negative for color change and rash.  Neurological:  Negative for seizures and syncope.  Psychiatric/Behavioral:  Positive for confusion and hallucinations.   All other systems reviewed and are negative.  Physical Exam Updated Vital Signs BP 132/76   Pulse 73   Temp 98.3 F (36.8 C) (Oral)   Resp 17   Ht '5\' 4"'$  (1.626 m)   Wt 89.3 kg   SpO2 97%   BMI 33.78 kg/m   Physical Exam Vitals and nursing note reviewed.  Constitutional:      Appearance: He is well-developed.  HENT:     Head: Normocephalic and atraumatic.     Right Ear: There is impacted cerumen.     Left Ear: There is impacted cerumen.  Eyes:     Conjunctiva/sclera: Conjunctivae normal.  Cardiovascular:     Rate and Rhythm: Normal rate and regular rhythm.     Heart sounds: No murmur heard. Pulmonary:     Effort: Pulmonary effort is normal. No respiratory distress.     Breath sounds: Normal breath sounds.  Abdominal:     Palpations: Abdomen is soft.     Tenderness: There is no abdominal tenderness.  Musculoskeletal:     Cervical back: Neck supple.  Skin:    General: Skin is warm and dry.  Neurological:     General: No focal deficit present.     Mental Status: He is alert.     Cranial Nerves: No cranial nerve deficit.     Sensory: No sensory deficit.     Motor: No weakness.     Coordination: Coordination is intact. Coordination normal.     Gait: Gait is intact.     Comments: AAOx2 (year and name, not place).  No significant vertical or rotary nystagmus.  No significant horizontal nystagmus.  Mild corrective saccade unilaterally on head  impulse test.  Negative test of skew.  GCS 14.  Psychiatric:         Attention and Perception: Attention and perception normal. He does not perceive auditory or visual hallucinations.    ED Results / Procedures / Treatments   Labs (all labs ordered are listed, but only abnormal results are displayed) Labs Reviewed  CBC WITH DIFFERENTIAL/PLATELET - Abnormal; Notable for the following components:      Result Value   Platelets 124 (*)    All other components within normal limits  COMPREHENSIVE METABOLIC PANEL - Abnormal; Notable for the following components:   Glucose, Bld 105 (*)    All other components within normal limits  SALICYLATE LEVEL - Abnormal; Notable for the following components:   Salicylate Lvl [id number] (*)    All other components within normal limits  BLOOD GAS, VENOUS - Abnormal; Notable for the following components:   pCO2, Ven 66.8 (*)    pO2, Ven 30.8 (*)    Bicarbonate 32.3 (*)    Acid-Base Excess 4.3 (*)    All other components within normal limits  ETHANOL  RAPID URINE DRUG SCREEN, HOSP PERFORMED  URINALYSIS, ROUTINE W REFLEX MICROSCOPIC  TSH  TROPONIN I (HIGH SENSITIVITY)  TROPONIN I (HIGH SENSITIVITY)    EKG EKG Interpretation  Date/Time:  Saturday June 02 2021 09:16:00 EDT Ventricular Rate:  68 PR Interval:  191 QRS Duration: 137 QT Interval:  429 QTC Calculation: 457 R Axis:   3 Text Interpretation: Sinus rhythm Right bundle branch block Wide QRS No STEMI T-wave inversion in Anterior leads Confirmed by Regan Lemming ([id number]) on 06/02/2021 10:37:58 AM  Radiology CT HEAD WO CONTRAST (5MM)  Result Date: 06/02/2021 CLINICAL DATA:  Delirium.  Mental status changes. EXAM: CT HEAD WITHOUT CONTRAST TECHNIQUE: Contiguous axial images were obtained from the base of the skull through the vertex without intravenous contrast. COMPARISON:  05/16/2017 FINDINGS: Brain: Moderate low density in the periventricular white matter likely related to small vessel disease. Somewhat more focal hypoattenuation in the right frontal lobe  subcortical white matter including on 24/2. No hemorrhage, hydrocephalus, intra-axial, or extra-axial fluid collection. Vascular: Intracranial atherosclerosis. Skull: No significant soft tissue swelling.  No skull fracture. Sinuses/Orbits: Normal imaged portions of the orbits and globes. Clear paranasal sinuses and mastoid air cells. Other: None. IMPRESSION: 1. Moderate periventricular white matter hypoattenuation is likely related to small vessel ischemia. Somewhat more focal right frontal subcortical white matter hypoattenuation could also relate to small vessel ischemia. If there are localizing symptoms to the right frontal lobe, consider dedicated brain MR. 2. Otherwise, no acute intracranial abnormality. Electronically Signed   By: Abigail Miyamoto M.D.   On: 06/02/2021 08:23   MR BRAIN W WO CONTRAST  Result Date: 06/02/2021 CLINICAL DATA:  Mental status change, unknown cause EXAM: MRI HEAD WITHOUT AND WITH CONTRAST TECHNIQUE: Multiplanar, multiecho pulse sequences of the brain and surrounding structures were obtained without and with intravenous contrast. CONTRAST:  44m GADAVIST GADOBUTROL 1 MMOL/ML IV SOLN COMPARISON:  None. FINDINGS: Motion artifact is present. Brain: There is no acute infarction or intracranial hemorrhage. There is no intracranial mass, mass effect, or edema. There is no hydrocephalus or extra-axial fluid collection. Prominence of the ventricles and sulci reflects generalized parenchymal volume loss. There are chronic cortical/subcortical infarcts of the right frontal lobe with involvement of precentral gyrus in the left temporal lobe. Additional patchy and confluent areas of T2 hyperintensity in the supratentorial white matter are nonspecific probably reflect moderate chronic microvascular ischemic changes.  No abnormal enhancement. Vascular: Major vessel flow voids at the skull base are preserved. Skull and upper cervical spine: Normal marrow signal is preserved. Sinuses/Orbits: Paranasal  sinuses are aerated. Bilateral lens replacements. Other: Sella is unremarkable.  Mastoid air cells are clear. IMPRESSION: No evidence of recent infarction, hemorrhage, or mass. No abnormal enhancement. Moderate chronic microvascular ischemic changes. Chronic right frontal and left temporal infarcts. Electronically Signed   By: Macy Mis M.D.   On: 06/02/2021 10:40    Procedures .Ear Cerumen Removal  Date/Time: 06/02/2021 1:19 PM Performed by: Regan Lemming, MD Authorized by: Regan Lemming, MD   Consent:    Consent obtained:  Verbal   Consent given by:  Patient   Risks, benefits, and alternatives were discussed: yes     Risks discussed:  TM perforation, pain and incomplete removal   Alternatives discussed:  Delayed treatment Universal protocol:    Patient identity confirmed:  Verbally with patient Procedure details:    Location:  L ear   Procedure type: curette     Procedure outcomes: cerumen removed (partial removal)   Post-procedure details:    Inspection:  Some cerumen remaining   Procedure completion:  Procedure terminated at patient's request .Ear Cerumen Removal  Date/Time: 06/02/2021 1:20 PM Performed by: Regan Lemming, MD Authorized by: Regan Lemming, MD   Consent:    Consent obtained:  Verbal   Risks discussed:  Infection, pain, TM perforation and incomplete removal   Alternatives discussed:  Delayed treatment Universal protocol:    Patient identity confirmed:  Verbally with patient Procedure details:    Location:  R ear   Procedure type: curette     Procedure outcomes: cerumen removed   Post-procedure details:    Inspection:  Some cerumen remaining   Hearing quality:  Normal   Procedure completion:  Procedure terminated at patient's request   Medications Ordered in ED Medications  ipratropium-albuterol (DUONEB) 0.5-2.5 (3) MG/3ML nebulizer solution 3 mL (3 mLs Nebulization Given 06/02/21 1049)  gadobutrol (GADAVIST) 1 MMOL/ML injection 8 mL (8 mLs  Intravenous Contrast Given 06/02/21 1015)    ED Course  I have reviewed the triage vital signs and the nursing notes.  Pertinent labs & imaging results that were available during my care of the patient were reviewed by me and considered in my medical decision making (see chart for details).    MDM Rules/Calculators/A&P                           87 year old male with medical history as above presenting to the emergency department with multiple complaints.  First complaint is of worsening symptoms of dementia with increasing confusion that per the patient's wife has been worsening over many weeks.  He has had intermittent episodes of hallucinations, most recently last night where he felt like there were people in the house who were not there.  No localizing neurologic deficits to suggest acute CVA.  Per wife, the patient is at his new mental baseline which has been in decline.  Additionally, the patient has complained of ringing in his ears for the past several hours.  He denies any hearing loss.  He arrived GCS 14, AAO x2, with a neurologic exam that included a hints exam that pointed away from central vertigo.  He denies any focal room spinning to suggest vertigo.  Physical exam revealed bilateral cerumen impactions.  Altered mental status work-up initiated to identify potential organic etiology of the patient's mental decline.  CT head revealed moderate periventricular white matter hypoattenuation likely related to small vessel ischemia, somewhat more focal right frontal subcortical white matter hypoattenuation could be related to small vessel ischemia.  Consider dedicated brain MRI.  Brain MRI was obtained without contrast and with contrast which revealed no evidence of recent infarction, hemorrhage or mass.  No abnormal enhancement.  Moderate chronic microvascular ischemic changes were noted with chronic right frontal and left temporal infarcts noted.  I chronic appearing nfarcts could point to  vascular dementia as a potential etiology of the patient's mental decline.  Laboratory work-up was performed which revealed a normal TSH, negative troponin, VBG revealing hypercarbia with a PCO2 of 66.8 with verbal compensation, bicarb of 32, subsequent pH of 7.31.  Salicylate level was normal.  Ethanol level normal, CMP grossly unremarkable, CBC without leukocytosis or anemia, thrombocytopenia to 124 noted.  UDS was negative and a urinalysis was negative.  Cerumen disimpaction was attempted bedside bilaterally with subsequent removal of a significant amount of cerumen via irrigation and curette.  Deeper cerumen impaction against the patient's tympanic membranes bilaterally unable to be successfully removed into the emergency department as the patient requested cessation of attempts at removal.  The patient's tinnitus is likely due to his cerumen impaction bilaterally.  Recommended follow-up with ENT in clinic for removal under a microscope if needed.  A referral was placed.  Overall, no acute indication for hospital admission at this time.  MRI imaging indicates likely chronic microvascular ischemic changes which could be an etiology for the patient's mental decline.  Recommended follow-up with his PCP regarding this for reassessment.  Final Clinical Impression(s) / ED Diagnoses Final diagnoses:  Tinnitus of both ears  Bilateral impacted cerumen  Hallucinations  Forgetfulness    Rx / DC Orders ED Discharge Orders          Ordered    Ambulatory referral to ENT        06/02/21 1317             Regan Lemming, MD 06/03/21 [place] Course    Regan Lemming, MD 06/03/21 2005

## 2021-06-02 NOTE — ED Provider Notes (Signed)
Emergency Medicine Provider Triage Evaluation Note  FILBERT ECKART , a 87 y.o. male  was evaluated in triage.  Pt complains of tinnitus. Abnormal gait. Started yesterday. No HA, weakness, numbness  Review of Systems  Positive: Abnormal gait Negative: Numbness, weakness  Physical Exam  BP (!) 182/90 (BP Location: Left Arm)   Pulse 76   Temp 98.3 F (36.8 C) (Oral)   Resp 16   Ht '5\' 4"'$  (1.626 m)   Wt 89.3 kg   SpO2 94%   BMI 33.78 kg/m  Gen:   Awake, no distress   Resp:  Normal effort  MSK:   Moves extremities without difficulty  Neuro: Equal strength, intact sensation, CN 2-12 grossly intact Other:    Medical Decision Making  Medically screening exam initiated at 6:58 AM.  Appropriate orders placed.  ZAVEION CAMPANY was informed that the remainder of the evaluation will be completed by another provider, this initial triage assessment does not replace that evaluation, and the importance of remaining in the ED until their evaluation is complete.  Neuro symptoms  No code stroke, No LVO   Latika Kronick A, PA-C 06/02/21 [id number]    Regan Lemming, MD 06/03/21 1302

## 2021-06-06 ENCOUNTER — Ambulatory Visit: Payer: Medicare HMO | Admitting: Adult Health

## 2021-06-06 ENCOUNTER — Ambulatory Visit: Payer: Medicare HMO | Admitting: Vascular Surgery

## 2021-06-06 DIAGNOSIS — H6123 Impacted cerumen, bilateral: Secondary | ICD-10-CM | POA: Diagnosis not present

## 2021-06-06 DIAGNOSIS — H9313 Tinnitus, bilateral: Secondary | ICD-10-CM | POA: Diagnosis not present

## 2021-06-06 DIAGNOSIS — J441 Chronic obstructive pulmonary disease with (acute) exacerbation: Secondary | ICD-10-CM | POA: Diagnosis not present

## 2021-06-06 DIAGNOSIS — H900 Conductive hearing loss, bilateral: Secondary | ICD-10-CM | POA: Diagnosis not present

## 2021-06-11 ENCOUNTER — Ambulatory Visit: Payer: Medicare HMO | Admitting: Adult Health

## 2021-06-11 ENCOUNTER — Other Ambulatory Visit: Payer: Self-pay

## 2021-06-11 ENCOUNTER — Encounter: Payer: Self-pay | Admitting: Adult Health

## 2021-06-11 ENCOUNTER — Ambulatory Visit (INDEPENDENT_AMBULATORY_CARE_PROVIDER_SITE_OTHER): Payer: Medicare HMO

## 2021-06-11 VITALS — BP 138/64 | HR 83 | Temp 98.9°F | Ht 63.0 in | Wt 195.0 lb

## 2021-06-11 DIAGNOSIS — G4733 Obstructive sleep apnea (adult) (pediatric): Secondary | ICD-10-CM

## 2021-06-11 DIAGNOSIS — J9611 Chronic respiratory failure with hypoxia: Secondary | ICD-10-CM | POA: Insufficient documentation

## 2021-06-11 DIAGNOSIS — J449 Chronic obstructive pulmonary disease, unspecified: Secondary | ICD-10-CM | POA: Diagnosis not present

## 2021-06-11 NOTE — Assessment & Plan Note (Signed)
OSA CPAP intolerant Continue on oxygen at bedtime

## 2021-06-11 NOTE — Assessment & Plan Note (Signed)
Severe COPD with high symptom burden.  Patient is encouraged on his compliance of medication and oxygen.  We will have him start back on his triple therapy maintenance inhaler Check chest x-ray  Plan  Patient Instructions  Restart BREZRI 2 puffs Twice daily, rinse after use.  Duoneb Neb every 6hrs as needed  Use Oxygen 2l/m with activity and At bedtime   Chest xray today.  Mucinex DM Twice daily  As needed  cough/congestion  Follow up with in Dr. Chase Caller in 4-6 weeks and As needed

## 2021-06-11 NOTE — Progress Notes (Signed)
$'@Patient'e$  ID: [name], male    DOB: [birth year], 87 y.o.   MRN: [id number]  Chief Complaint  Patient presents with   Follow-up    Referring provider: [name], MD  HPI: 87 year old male former smoker followed for severe COPD with emphysema and oxygen dependent respiratory failure, obstructive sleep apnea CPAP intolerant Medical history significant for diastolic heart failure coronary artery disease, peripheral vascular disease, diabetes  TEST/EVENTS :  >>HRCT 05/11/18- IMPRESSION: No findings to suggest interstitial lung disease. Mild diffuse bronchial wall thickening with mild centrilobular and paraseptal emphysema; imaging findings compatible with the reported clinical history of COPD.  >>PFTs 05/20/18- FVC 1.63 (60%), FEV1  0.77 (42%), Ratio 48 (65%)  06/11/2021 Follow up : COPD , O2 RF  Patient returns for a follow-up visit.  Patient is accompanied by his daughter.  Patient's daughter and son-in-law live with him at home.  She says that she has noticed over the last month or so his breathing has not been as good.  He gets short of breath with walking to the bathroom and has episodes where he gets panicked with how bad his shortness of breath is.  Patient does have oxygen at home that he uses at bedtime and with activities.  He also has a portable oxygen concentrator.  Patient admits he does not wear this unless he gets an attack of his shortness of breath. Patient is on BREZTRI but admits he does not use this on a regular basis.  He does have albuterol and DuoNeb nebulizer that he uses as needed.  He denies any discolored mucus, fever, chest pain, orthopnea.   Allergies  Allergen Reactions   Clopidogrel Itching    Immunization History  Administered Date(s) Administered   Influenza Split 08/01/2009, 08/16/2010, 07/14/2013   Influenza, High Dose Seasonal PF 06/19/2016, 07/07/2017, 11/05/2018, 08/11/2019   Influenza,inj,quad, With Preservative 07/04/2014    Influenza-Unspecified 09/24/2011, 07/01/2012, 08/20/2013   PFIZER(Purple Top)SARS-COV-2 Vaccination 12/27/2019, 01/19/2020, 02/23/2021   Pneumococcal Conjugate-13 08/20/2013   Pneumococcal Polysaccharide-23 01/13/1999   Pneumococcal-Unspecified 08/14/2013   Td 01/13/1999   Tdap 12/24/2011, 01/12/2019   Zoster, Live 10/23/2009    Past Medical History:  Diagnosis Date   Anxiety    Asthma    Chronic diastolic CHF (congestive heart failure) (HCC)    diastolic    COPD (chronic obstructive pulmonary disease) ([place])    Coronary artery disease    s/p PCI of RCA 03/2009 at which time there was 70% ramus and 90% RCA, cath 01/2011 showed patent stents in RCA with moderate prox disesae, aneurysmal left circ and small 90% ramus s/p PCI, patent LAD EF 55%   Diabetes mellitus    Diverticulitis Oct. 2013   bleeding in the past and Effient for his CAD was stopped   DVT (deep venous thrombosis) (HCC)    GERD (gastroesophageal reflux disease)    Hypercholesterolemia    Hypertension    Hypothyroidism    Obesity (BMI 30-39.9)    OSA (obstructive sleep apnea)    severe on CPAP   Peripheral vascular disease ([place]) 11/2006   s/p left stent   Shortness of breath    Sleep apnea    severe OSA awaiting CPAP titration    Tobacco History: Social History   Tobacco Use  Smoking Status Former   Packs/day: 0.50   Years: 15.00   Pack years: 7.50   Types: Cigarettes, Cigars   Quit date: 10/15/1963   Years since quitting: 73.6  Smokeless Tobacco Never   Counseling  given: Not Answered   Outpatient Medications Prior to Visit  Medication Sig Dispense Refill   albuterol (PROVENTIL HFA;VENTOLIN HFA) 108 (90 BASE) MCG/ACT inhaler Inhale 2 puffs into the lungs every 6 (six) hours as needed for wheezing or shortness of breath.      albuterol (PROVENTIL) (2.5 MG/3ML) 0.083% nebulizer solution Take 3 mLs (2.5 mg total) by nebulization every 4 (four) hours as needed for wheezing or shortness of breath. 75 mL 0    aspirin EC 81 MG tablet Take 81 mg by mouth daily.     Budeson-Glycopyrrol-Formoterol (BREZTRI AEROSPHERE) 160-9-4.8 MCG/ACT AERO Inhale 2 puffs into the lungs in the morning and at bedtime.     ezetimibe (ZETIA) 10 MG tablet Take 1 tablet by mouth once daily 60 tablet 1   glimepiride (AMARYL) 2 MG tablet Take 2 mg by mouth every evening.      ipratropium-albuterol (DUONEB) 0.5-2.5 (3) MG/3ML SOLN Take 3 mLs by nebulization every 4 (four) hours as needed (asthma).     levothyroxine (SYNTHROID, LEVOTHROID) 150 MCG tablet Take 150 mcg by mouth every evening.      meloxicam (MOBIC) 15 MG tablet Take 15 mg by mouth daily as needed for pain.     metoprolol succinate (TOPROL-XL) 50 MG 24 hr tablet Take 1 tablet by mouth once daily 60 tablet 1   nitroGLYCERIN (NITROSTAT) 0.4 MG SL tablet Place 1 tablet (0.4 mg total) under the tongue every 5 (five) minutes as needed for chest pain. 25 tablet 6   rosuvastatin (CRESTOR) 20 MG tablet Take 1 tablet (20 mg total) by mouth daily. 90 tablet 3   No facility-administered medications prior to visit.     Review of Systems:   Constitutional:   No  weight loss, night sweats,  Fevers, chills,  +fatigue, or  lassitude.  HEENT:   No headaches,  Difficulty swallowing,  Tooth/dental problems, or  Sore throat,                No sneezing, itching, ear ache,  +nasal congestion, post nasal drip,   CV:  No chest pain,  Orthopnea, PND, swelling in lower extremities, anasarca, dizziness, palpitations, syncope.   GI  No heartburn, indigestion, abdominal pain, nausea, vomiting, diarrhea, change in bowel habits, loss of appetite, bloody stools.   Resp:   No chest wall deformity  Skin: no rash or lesions.  GU: no dysuria, change in color of urine, no urgency or frequency.  No flank pain, no hematuria   MS:  No joint pain or swelling.  No decreased range of motion.  No back pain.    Physical Exam  BP 138/64 (BP Location: Left Arm, Patient Position: Sitting, Cuff  Size: Normal)   Pulse 83   Temp 98.9 F (37.2 C) (Oral)   Ht '5\' 3"'$  (1.6 m)   Wt 195 lb (88.5 kg)   SpO2 98%   BMI 34.54 kg/m   GEN: A/Ox3; pleasant , NAD, elderly    HEENT:  [place]/AT,  , NOSE-clear, THROAT-clear, no lesions, no postnasal drip or exudate noted.   NECK:  Supple w/ fair ROM; no JVD; normal carotid impulses w/o bruits; no thyromegaly or nodules palpated; no lymphadenopathy.    RESP  Clear  P & A; w/o, wheezes/ rales/ or rhonchi. no accessory muscle use, no dullness to percussion  CARD:  RRR, no m/r/g, tr peripheral edema, pulses intact, no cyanosis or clubbing.  GI:   Soft & nt; nml bowel sounds; no organomegaly or masses  detected.   Musco: Warm bil, no deformities or joint swelling noted.   Neuro: alert, no focal deficits noted.    Skin: Warm, no lesions or rashes    Lab Results:  CBC    Component Value Date/Time   WBC 8.4 06/02/2021 0737   RBC 5.16 06/02/2021 0737   HGB 15.1 06/02/2021 0737   HGB 13.8 01/26/2018 0956   HCT 47.3 06/02/2021 0737   HCT 41.4 01/26/2018 0956   PLT 124 (L) 06/02/2021 0737   PLT 166 01/26/2018 0956   MCV 91.7 06/02/2021 0737   MCV 88 01/26/2018 0956   MCH 29.3 06/02/2021 0737   MCHC 31.9 06/02/2021 0737   RDW 13.1 06/02/2021 0737   RDW 14.1 01/26/2018 0956   LYMPHSABS 2.4 06/02/2021 0737   LYMPHSABS 2.1 12/05/2016 0957   MONOABS 0.7 06/02/2021 0737   EOSABS 0.3 06/02/2021 0737   EOSABS 0.2 12/05/2016 0957   BASOSABS 0.1 06/02/2021 0737   BASOSABS 0.0 12/05/2016 0957    BMET    Component Value Date/Time   NA 140 06/02/2021 0737   NA 140 07/07/2020 0948   K 3.9 06/02/2021 0737   CL 102 06/02/2021 0737   CO2 29 06/02/2021 0737   GLUCOSE 105 (H) 06/02/2021 0737   BUN 15 06/02/2021 0737   BUN 14 07/07/2020 0948   CREATININE 1.06 06/02/2021 0737   CALCIUM 9.8 06/02/2021 0737   GFRNONAA >60 06/02/2021 0737   GFRAA 63 07/07/2020 0948    BNP    Component Value Date/Time   BNP 73.4 12/28/2020 1143     ProBNP    Component Value Date/Time   PROBNP 103 01/26/2018 0956   PROBNP 33.0 11/16/2014 1124    Imaging: CT HEAD WO CONTRAST (5MM)  Result Date: 06/02/2021 CLINICAL DATA:  Delirium.  Mental status changes. EXAM: CT HEAD WITHOUT CONTRAST TECHNIQUE: Contiguous axial images were obtained from the base of the skull through the vertex without intravenous contrast. COMPARISON:  05/16/2017 FINDINGS: Brain: Moderate low density in the periventricular white matter likely related to small vessel disease. Somewhat more focal hypoattenuation in the right frontal lobe subcortical white matter including on 24/2. No hemorrhage, hydrocephalus, intra-axial, or extra-axial fluid collection. Vascular: Intracranial atherosclerosis. Skull: No significant soft tissue swelling.  No skull fracture. Sinuses/Orbits: Normal imaged portions of the orbits and globes. Clear paranasal sinuses and mastoid air cells. Other: None. IMPRESSION: 1. Moderate periventricular white matter hypoattenuation is likely related to small vessel ischemia. Somewhat more focal right frontal subcortical white matter hypoattenuation could also relate to small vessel ischemia. If there are localizing symptoms to the right frontal lobe, consider dedicated brain MR. 2. Otherwise, no acute intracranial abnormality. Electronically Signed   By: Abigail Miyamoto M.D.   On: 06/02/2021 08:23   MR BRAIN W WO CONTRAST  Result Date: 06/02/2021 CLINICAL DATA:  Mental status change, unknown cause EXAM: MRI HEAD WITHOUT AND WITH CONTRAST TECHNIQUE: Multiplanar, multiecho pulse sequences of the brain and surrounding structures were obtained without and with intravenous contrast. CONTRAST:  67m GADAVIST GADOBUTROL 1 MMOL/ML IV SOLN COMPARISON:  None. FINDINGS: Motion artifact is present. Brain: There is no acute infarction or intracranial hemorrhage. There is no intracranial mass, mass effect, or edema. There is no hydrocephalus or extra-axial fluid collection.  Prominence of the ventricles and sulci reflects generalized parenchymal volume loss. There are chronic cortical/subcortical infarcts of the right frontal lobe with involvement of precentral gyrus in the left temporal lobe. Additional patchy and confluent areas of T2 hyperintensity in  the supratentorial white matter are nonspecific probably reflect moderate chronic microvascular ischemic changes. No abnormal enhancement. Vascular: Major vessel flow voids at the skull base are preserved. Skull and upper cervical spine: Normal marrow signal is preserved. Sinuses/Orbits: Paranasal sinuses are aerated. Bilateral lens replacements. Other: Sella is unremarkable.  Mastoid air cells are clear. IMPRESSION: No evidence of recent infarction, hemorrhage, or mass. No abnormal enhancement. Moderate chronic microvascular ischemic changes. Chronic right frontal and left temporal infarcts. Electronically Signed   By: Macy Mis M.D.   On: 06/02/2021 10:40      PFT Results Latest Ref Rng & Units 11/17/2020 05/20/2018  FVC-Pre L 1.77 1.63  FVC-Predicted Pre % 70 60  FVC-Post L 1.79 1.71  FVC-Predicted Post % 71 63  Pre FEV1/FVC % % 47 48  Post FEV1/FCV % % 49 46  FEV1-Pre L 0.84 0.77  FEV1-Predicted Pre % 49 42  FEV1-Post L 0.87 0.79  DLCO uncorrected ml/min/mmHg 10.65 -  DLCO UNC% % 57 -  DLCO corrected ml/min/mmHg 10.65 -  DLCO COR %Predicted % 57 -  DLVA Predicted % 81 -    No results found for: NITRICOXIDE      Assessment & Plan:   COPD (chronic obstructive pulmonary disease) (HCC) Severe COPD with high symptom burden.  Patient is encouraged on his compliance of medication and oxygen.  We will have him start back on his triple therapy maintenance inhaler Check chest x-ray  Plan  Patient Instructions  Restart BREZRI 2 puffs Twice daily, rinse after use.  Duoneb Neb every 6hrs as needed  Use Oxygen 2l/m with activity and At bedtime   Chest xray today.  Mucinex DM Twice daily  As needed   cough/congestion  Follow up with in Dr. Chase Caller in 4-6 weeks and As needed           OSA (obstructive sleep apnea) OSA CPAP intolerant Continue on oxygen at bedtime  Chronic respiratory failure with hypoxia [facility]) Patient had desaturations with ambulation.  He is post to be on oxygen 2 L with activity and at bedtime.  Patient is encouraged on oxygen compliance.  Plan  Patient Instructions  Restart BREZRI 2 puffs Twice daily, rinse after use.  Duoneb Neb every 6hrs as needed  Use Oxygen 2l/m with activity and At bedtime   Chest xray today.  Mucinex DM Twice daily  As needed  cough/congestion  Follow up with in Dr. Chase Caller in 4-6 weeks and As needed             Rexene Edison, NP 06/11/2021

## 2021-06-11 NOTE — Patient Instructions (Addendum)
Restart BREZRI 2 puffs Twice daily, rinse after use.  Duoneb Neb every 6hrs as needed  Use Oxygen 2l/m with activity and At bedtime   Chest xray today.  Mucinex DM Twice daily  As needed  cough/congestion  Follow up with in Dr. Chase Caller in 4-6 weeks and As needed

## 2021-06-11 NOTE — Assessment & Plan Note (Addendum)
Patient had desaturations with ambulation.  He is post to be on oxygen 2 L with activity and at bedtime.  Patient is encouraged on oxygen compliance.  Plan  Patient Instructions  Restart BREZRI 2 puffs Twice daily, rinse after use.  Duoneb Neb every 6hrs as needed  Use Oxygen 2l/m with activity and At bedtime   Chest xray today.  Mucinex DM Twice daily  As needed  cough/congestion  Follow up with in Dr. Chase Caller in 4-6 weeks and As needed

## 2021-06-13 ENCOUNTER — Other Ambulatory Visit: Payer: Self-pay

## 2021-06-13 ENCOUNTER — Encounter: Payer: Self-pay | Admitting: Vascular Surgery

## 2021-06-13 ENCOUNTER — Ambulatory Visit: Payer: Medicare HMO | Admitting: Vascular Surgery

## 2021-06-13 VITALS — BP 167/78 | HR 61 | Temp 97.9°F | Resp 20 | Ht 65.0 in | Wt 193.0 lb

## 2021-06-13 DIAGNOSIS — I872 Venous insufficiency (chronic) (peripheral): Secondary | ICD-10-CM

## 2021-06-13 DIAGNOSIS — I739 Peripheral vascular disease, unspecified: Secondary | ICD-10-CM

## 2021-06-13 NOTE — Progress Notes (Signed)
REASON FOR VISIT:   Follow-up of peripheral vascular disease.  MEDICAL ISSUES:   PERIPHERAL VASCULAR DISEASE: This patient has combined peripheral vascular disease and chronic venous insufficiency.  Fortunately he does not have any ulcers nor does he have rest pain.  He is undergone an arteriogram which showed no options for revascularization.  He has severe tibial artery occlusive disease.  I have encouraged him to keep the skin well lubricated especially as the fall approaches.  I encouraged him to stay as active as possible.  We have also discussed the importance of nutrition.  I think he can elevate his legs some but not too much given his peripheral vascular disease.  I would discourage too much compression.  I will plan on seeing him back in 9 months with follow-up ABIs.  He knows to call sooner if he has problems.   HPI:   [name] is a pleasant 87 y.o. male who presented with combined peripheral vascular disease and chronic venous insufficiency.  He was having worsening pain in the left foot and we proceeded with arteriography.  He underwent an arteriogram on 03/16/2021 which showed severe infrainguinal arterial occlusive disease.  There were no good options from an endovascular standpoint.  Given that he was 63 with coronary artery disease, obesity, congestive heart failure, and COPD, I did not think he was a good candidate for open surgery.  Fortunately he did not have any wounds.  He comes in for 22-monthfollow-up visit.  Since I saw him last he has no specific complaints.  He denies any history of rest pain.  I do not get any history of claudication although I suspect his activity is fairly limited.  He said no recent chest pain.  He does admit to dyspnea on exertion.  Past Medical History:  Diagnosis Date   Anxiety    Asthma    Chronic diastolic CHF (congestive heart failure) (HCC)    diastolic    [place]D [place]    Coronary artery  disease    s/p PCI of RCA 03/2009 at which time there was 70% ramus and 90% RCA, cath 01/2011 showed patent stents in RCA with moderate prox disesae, aneurysmal left circ and small 90% ramus s/p PCI, patent LAD EF 55%   Diabetes mellitus    Diverticulitis Oct. 2013   bleeding in the past and Effient for his CAD was stopped   DVT (deep venous thrombosis) (HCC)    GERD (gastroesophageal reflux disease)    Hypercholesterolemia    Hypertension    Hypothyroidism    Obesity (BMI 30-39.9)    OSA (obstructive sleep apnea)    severe on CPAP   [place]   s/p left stent   Shortness of breath    Sleep apnea    severe OSA awaiting CPAP titration    Family History  Problem Relation Age of Onset   Diabetes Mother    Hyperlipidemia Mother    Hypertension Mother    Heart attack Mother    Heart disease Mother        before age 85  Diabetes Brother    Heart disease Father        before age 85  Breast cancer Sister    Diabetes Sister    Hyperlipidemia Sister    Hypertension Sister    Heart attack Sister    Hyperlipidemia Sister    Hypertension Sister    Heart disease  Sister        Heart Disease before age 28   Asthma Sister    Malignant hyperthermia Neg Hx     SOCIAL HISTORY: Social History   Tobacco Use   Smoking status: Former    Packs/day: 0.50    Years: 15.00    Pack years: 7.50    Types: Cigarettes, Cigars    Quit date: 10/15/1963    Years since quitting: 57.7   Smokeless tobacco: Never  Substance Use Topics   Alcohol use: No    Allergies  Allergen Reactions   Clopidogrel Itching    Current Outpatient Medications  Medication Sig Dispense Refill   albuterol (PROVENTIL HFA;VENTOLIN HFA) 108 (90 BASE) MCG/ACT inhaler Inhale 2 puffs into the lungs every 6 (six) hours as needed for wheezing or shortness of breath.      albuterol (PROVENTIL) (2.5 MG/3ML) 0.083% nebulizer solution Take 3 mLs (2.5 mg total) by nebulization every 4 (four)  hours as needed for wheezing or shortness of breath. 75 mL 0   aspirin EC 81 MG tablet Take 81 mg by mouth daily.     Budeson-Glycopyrrol-Formoterol (BREZTRI AEROSPHERE) 160-9-4.8 MCG/ACT AERO Inhale 2 puffs into the lungs in the morning and at bedtime.     ezetimibe (ZETIA) 10 MG tablet Take 1 tablet by mouth once daily 60 tablet 1   glimepiride (AMARYL) 2 MG tablet Take 2 mg by mouth every evening.      ipratropium-albuterol (DUONEB) 0.5-2.5 (3) MG/3ML SOLN Take 3 mLs by nebulization every 4 (four) hours as needed (asthma).     levothyroxine (SYNTHROID, LEVOTHROID) 150 MCG tablet Take 150 mcg by mouth every evening.      meloxicam (MOBIC) 15 MG tablet Take 15 mg by mouth daily as needed for pain.     metoprolol succinate (TOPROL-XL) 50 MG 24 hr tablet Take 1 tablet by mouth once daily 60 tablet 1   nitroGLYCERIN (NITROSTAT) 0.4 MG SL tablet Place 1 tablet (0.4 mg total) under the tongue every 5 (five) minutes as needed for chest pain. 25 tablet 6   rosuvastatin (CRESTOR) 20 MG tablet Take 1 tablet (20 mg total) by mouth daily. 90 tablet 3   No current facility-administered medications for this visit.    REVIEW OF SYSTEMS:  '[X]'$  denotes positive finding, '[ ]'$  denotes negative finding Cardiac  Comments:  Chest pain or chest pressure:    Shortness of breath upon exertion: x   Short of breath when lying flat:    Irregular heart rhythm:        Vascular    Pain in calf, thigh, or hip brought on by ambulation:    Pain in feet at night that wakes you up from your sleep:     Blood clot in your veins:    Leg swelling:  x       Pulmonary    Oxygen at home: x   Productive cough:     Wheezing:  x       Neurologic    Sudden weakness in arms or legs:     Sudden numbness in arms or legs:     Sudden onset of difficulty speaking or slurred speech:    Temporary loss of vision in one eye:     Problems with dizziness:         Gastrointestinal    Blood in stool:     Vomited blood:          Genitourinary    Burning when  urinating:     Blood in urine:        Psychiatric    Major depression:         Hematologic    Bleeding problems:    Problems with blood clotting too easily:        Skin    Rashes or ulcers:        Constitutional    Fever or chills:     PHYSICAL EXAM:   Vitals:   06/13/21 1418  BP: (!) 167/78  Pulse: 61  Resp: 20  Temp: 97.9 F (36.6 C)  TempSrc: Temporal  SpO2: 93%  Weight: 193 lb (87.5 kg)  Height: '5\' 5"'$  (1.651 m)    GENERAL: The patient is a well-nourished male, in no acute distress. The vital signs are documented above. CARDIAC: There is a regular rate and rhythm.  VASCULAR: I do not detect carotid bruits. He has palpable femoral pulses.  I cannot palpate pedal pulses.   He has significant bilateral lower extremity swelling. PULMONARY: He has decreased breath sounds at the bases. MUSCULOSKELETAL: There are no major deformities or cyanosis. NEUROLOGIC: No focal weakness or paresthesias are detected. SKIN: He has hyperpigmentation bilaterally especially on the left. PSYCHIATRIC: The patient has a normal affect.  DATA:    AORTOGRAM: I reviewed his aortogram.  On the right side there was no significant inflow disease.  There was moderate disease in the mid superficial femoral artery and occlusion of the distal superficial femoral artery and popliteal artery with reconstitution of the posterior tibial artery on the right only.  On the left side the common iliac artery stent was patent.  There was no significant inflow disease.  There was moderate disease in the mid superficial femoral artery with occlusion of the popliteal artery at the level of the knee.  There was reconstitution of the peroneal and anterior tibial arteries only.  [name] [facility] [phone]

## 2021-06-14 DIAGNOSIS — H6123 Impacted cerumen, bilateral: Secondary | ICD-10-CM | POA: Diagnosis not present

## 2021-06-14 DIAGNOSIS — H900 Conductive hearing loss, bilateral: Secondary | ICD-10-CM | POA: Diagnosis not present

## 2021-06-14 DIAGNOSIS — H9313 Tinnitus, bilateral: Secondary | ICD-10-CM | POA: Diagnosis not present

## 2021-06-15 ENCOUNTER — Other Ambulatory Visit: Payer: Self-pay

## 2021-06-15 DIAGNOSIS — I739 Peripheral vascular disease, unspecified: Secondary | ICD-10-CM

## 2021-06-19 ENCOUNTER — Encounter: Payer: Self-pay | Admitting: *Deleted

## 2021-07-02 ENCOUNTER — Encounter: Payer: Self-pay | Admitting: Cardiology

## 2021-07-02 ENCOUNTER — Other Ambulatory Visit: Payer: Self-pay

## 2021-07-02 ENCOUNTER — Ambulatory Visit: Payer: Medicare HMO | Admitting: Cardiology

## 2021-07-02 VITALS — BP 116/58 | HR 66 | Ht 63.0 in | Wt 195.0 lb

## 2021-07-02 DIAGNOSIS — I5032 Chronic diastolic (congestive) heart failure: Secondary | ICD-10-CM | POA: Diagnosis not present

## 2021-07-02 DIAGNOSIS — R55 Syncope and collapse: Secondary | ICD-10-CM

## 2021-07-02 DIAGNOSIS — G4733 Obstructive sleep apnea (adult) (pediatric): Secondary | ICD-10-CM | POA: Diagnosis not present

## 2021-07-02 DIAGNOSIS — I10 Essential (primary) hypertension: Secondary | ICD-10-CM | POA: Diagnosis not present

## 2021-07-02 DIAGNOSIS — E785 Hyperlipidemia, unspecified: Secondary | ICD-10-CM | POA: Diagnosis not present

## 2021-07-02 DIAGNOSIS — I2510 Atherosclerotic heart disease of native coronary artery without angina pectoris: Secondary | ICD-10-CM | POA: Diagnosis not present

## 2021-07-02 MED ORDER — EZETIMIBE 10 MG PO TABS
10.0000 mg | ORAL_TABLET | Freq: Every day | ORAL | 1 refills | Status: DC
Start: 2021-07-02 — End: 2021-07-19

## 2021-07-02 MED ORDER — METOPROLOL SUCCINATE ER 50 MG PO TB24: 50.0000 mg | ORAL_TABLET | Freq: Every day | ORAL | 3 refills | Status: AC

## 2021-07-02 MED ORDER — ROSUVASTATIN CALCIUM 20 MG PO TABS: 20.0000 mg | ORAL_TABLET | Freq: Every day | ORAL | 3 refills | Status: AC

## 2021-07-02 NOTE — Addendum Note (Signed)
Addended by: Antonieta Iba on: 07/02/2021 03:08 PM   Modules accepted: Orders

## 2021-07-02 NOTE — Patient Instructions (Signed)

## 2021-07-02 NOTE — Progress Notes (Signed)
Date:  07/02/2021   ID:  [name], DOB 1930-07-19, MRN [id number]  PCP:  [name], MD  Cardiologist:  Fransico Him, MD  Electrophysiologist:  None   Chief Complaint:  CHF, OSA, CAD, DM  History of Present Illness:    [name] is a 87 y.o. male  with a hx of ASCAD (s/p PCI of RCA 03/2009 with residual 70% ramus and 90% RCA, cath 01/2011 showed patent stents in RCA with moderate prox disesae, aneurysmal left circ and small 90% ramus s/p PCI),  He also has a hx of HTN, dyslipidemia, chronic diastolic CHF, obesity and OSA on CPAP.  He has COPD and emphysema and is on 2L [place] followed by pulmonary. He also was dx with PAD with medical management recommended given his advanced age.87   He is here today for followup and is doing well.  He has chronic DOE related to COPD and is on O2. He has hx of DVT in the past with last dopplers in 11/2020 with no recurrent DVT but has chronic LLE edema since that.  He denies any chest pain or pressure, PND, orthopnea, dizziness, palpitations or syncope. He is compliant with his meds and is tolerating meds with no SE.     He had been on CPAP in the past for his OSA but is adamant that he does not want to pursue getting back on it.   Prior CV studies:   The following studies were reviewed today:  PAP compliance download  Past Medical History:  Diagnosis Date   Anxiety    Asthma    Chronic diastolic CHF (congestive heart failure) (HCC)    diastolic    COPD (chronic obstructive pulmonary disease) (HCC)    Coronary artery disease    s/p PCI of RCA 03/2009 at which time there was 70% ramus and 90% RCA, cath 01/2011 showed patent stents in RCA with moderate prox disesae, aneurysmal left circ and small 90% ramus s/p PCI, patent LAD EF 55%   Diabetes mellitus    Diverticulitis Oct. 2013   bleeding in the past and Effient for his CAD was stopped   DVT (deep venous thrombosis) (HCC)    GERD (gastroesophageal reflux disease)    Hypercholesterolemia     Hypertension    Hypothyroidism    Obesity (BMI 30-39.9)    OSA (obstructive sleep apnea)    severe on CPAP   Peripheral vascular disease ([place]) 11/2006   s/p left stent   Shortness of breath    Sleep apnea    severe OSA awaiting CPAP titration   Past Surgical History:  Procedure Laterality Date   ABDOMINAL AORTAGRAM N/A 04/20/2012   Procedure: ABDOMINAL Maxcine Ham;  Surgeon: Angelia Mould, MD;  Location[facility] CATH LAB;  Service: Cardiovascular;  Laterality: N/A;   ABDOMINAL AORTAGRAM N/A 12/29/2012   Procedure: ABDOMINAL Maxcine Ham;  Surgeon: Serafina Mitchell, MD;  Location: [facility] CATH LAB;  Service: Cardiovascular;  Laterality: N/A;   ABDOMINAL AORTOGRAM W/LOWER EXTREMITY Bilateral 03/16/2021   Procedure: ABDOMINAL AORTOGRAM W/LOWER EXTREMITY;  Surgeon: Angelia Mould, MD;  Location: [facility] CV LAB;  Service: Cardiovascular;  Laterality: Bilateral;   BALLOON DILATION  12/26/2011   Procedure: BALLOON DILATION;  Surgeon: Garlan Fair, MD;  Location: [facility] ENDOSCOPY;  Service: Endoscopy;  Laterality: N/A;   COLONOSCOPY  08/14/2012   Procedure: COLONOSCOPY;  Surgeon: Arta Silence, MD;  Location: [facility] ENDOSCOPY;  Service: Endoscopy;  Laterality: N/A;   CORONARY STENT PLACEMENT  2010  and 2012   ESOPHAGOGASTRODUODENOSCOPY  12/26/2011   Procedure: ESOPHAGOGASTRODUODENOSCOPY (EGD);  Surgeon: Garlan Fair, MD;  Location: [facility];  Service: Endoscopy;  Laterality: N/A;   ESOPHAGOGASTRODUODENOSCOPY  08/13/2012   Procedure: ESOPHAGOGASTRODUODENOSCOPY (EGD);  Surgeon: Arta Silence, MD;  Location: [facility] ENDOS[facility];  Service: Endoscopy;  Laterality: Left;   HERNIA REPAIR  1992   evntral hernia repair   LOWER EXTREMITY ANGIOGRAM Bilateral 12/29/2012   Procedure: LOWER EXTREMITY ANGIOGRAM;  Surgeon: Serafina Mitchell, MD;  Location[facility] CATH LAB;  Service: Cardiovascular;  Laterality: Bilateral;  bilat lower extrem angio     Current Meds  Medication Sig   albuterol (PROVENTIL HFA;VENTOLIN  HFA) 108 (90 BASE) MCG/ACT inhaler Inhale 2 puffs into the lungs every 6 (six) hours as needed for wheezing or shortness of breath.    albuterol (PROVENTIL) (2.5 MG/3ML) 0.083% nebulizer solution Take 3 mLs (2.5 mg total) by nebulization every 4 (four) hours as needed for wheezing or shortness of breath.   aspirin EC 81 MG tablet Take 81 mg by mouth daily.   Budeson-Glycopyrrol-Formoterol (BREZTRI AEROSPHERE) 160-9-4.8 MCG/ACT AERO Inhale 2 puffs into the lungs in the morning and at bedtime.   ezetimibe (ZETIA) 10 MG tablet Take 1 tablet by mouth once daily   glimepiride (AMARYL) 2 MG tablet Take 2 mg by mouth every evening.    ipratropium-albuterol (DUONEB) 0.5-2.5 (3) MG/3ML SOLN Take 3 mLs by nebulization every 4 (four) hours as needed (asthma).   levothyroxine (SYNTHROID, LEVOTHROID) 150 MCG tablet Take 150 mcg by mouth every evening.    meloxicam (MOBIC) 15 MG tablet Take 15 mg by mouth daily as needed for pain.   metoprolol succinate (TOPROL-XL) 50 MG 24 hr tablet Take 1 tablet by mouth once daily   nitroGLYCERIN (NITROSTAT) 0.4 MG SL tablet Place 1 tablet (0.4 mg total) under the tongue every 5 (five) minutes as needed for chest pain.   rosuvastatin (CRESTOR) 20 MG tablet Take 1 tablet (20 mg total) by mouth daily.     Allergies:   Clopidogrel   Social History   Tobacco Use   Smoking status: Former    Packs/day: 0.50    Years: 15.00    Pack years: 7.50    Types: Cigarettes, Cigars    Quit date: 10/15/1963    Years since quitting: 57.7   Smokeless tobacco: Never  Vaping Use   Vaping Use: Never used  Substance Use Topics   Alcohol use: No   Drug use: No     Family Hx: The patient's family history includes Asthma in his sister; Breast cancer in his sister; Diabetes in his brother, mother, and sister; Heart attack in his mother and sister; Heart disease in his father, mother, and sister; Hyperlipidemia in his mother, sister, and sister; Hypertension in his mother, sister, and  sister. There is no history of Malignant hyperthermia.  ROS:   Please see the history of present illness.     All other systems reviewed and are negative.   Labs/Other Tests and Data Reviewed:    Recent Labs: 12/28/2020: B Natriuretic Peptide 73.4 06/02/2021: ALT 17; BUN 15; Creatinine, Ser 1.06; Hemoglobin 15.1; Platelets 124; Potassium 3.9; Sodium 140; TSH 0.414   Recent Lipid Panel Lab Results  Component Value Date/Time   CHOL 125 07/07/2020 09:48 AM   TRIG 72 07/07/2020 09:48 AM   HDL 53 07/07/2020 09:48 AM   CHOLHDL 2.4 07/07/2020 09:48 AM   CHOLHDL 4 11/16/2014 11:24 AM   LDLCALC 57 07/07/2020 09:48 AM  Wt Readings from Last 3 Encounters:  07/02/21 195 lb (88.5 kg)  06/13/21 193 lb (87.5 kg)  06/11/21 195 lb (88.5 kg)     Objective:    Vital Signs:  BP (!) 116/58   Pulse 66   Ht '5\' 3"'$  (1.6 m)   Wt 195 lb (88.5 kg)   SpO2 92%   BMI 34.54 kg/m    GEN: Well nourished, well developed in no acute distress HEENT: Normal NECK: No JVD; No carotid bruits LYMPHATICS: No lymphadenopathy CARDIAC:RRR, no murmurs, rubs, gallops RESPIRATORY:  Clear to auscultation without rales, wheezing or rhonchi  ABDOMEN: Soft, non-tender, non-distended MUSCULOSKELETAL:  Trace edema; No deformity  SKIN: Warm and dry NEUROLOGIC:  Alert and oriented x 3 PSYCHIATRIC:  Normal affect   ASSESSMENT & PLAN:    1.  ASCAD  - s/p PCI of RCA 03/2009 with residual 70% ramus and 90% RCA.   -Cath 01/2011 showed patent stents in RCA with moderate prox disesae, aneurysmal left circ and small 90% ramus s/p PCI.   -he has not had any angina since I saw him last -continue ASA, Toprol XL '50mg'$  daily and statin   2.  HTN  -BP is adequately controlled on exam today -Continue prescription drug management with Toprol XL '50mg'$  daily>refilled   3.  Chronic diastolic CHF  -his weight is stable and he has no SOB -chronic LE edema is stable -he does not appear volume overloaded on exam today -Continue  prescription drug management with Lasix '40mg'$  daily PRN for swelling -I have personally reviewed and interpreted outside labs performed by patient's PCP which showed SCr 1.06, K+ 3.9 in Aug 2022      4. OSA  -he stopped using his CPAP because he does not think he needs it -his OSA was severe and I suspect he still has apnea -he was supposed to have a HST but never had it done -he does not want to go back on PAP therapy  5.  Hyperlipidemia -I have personally reviewed and interpreted outside labs performed by patient's PCP which showed LDL 72, HDL 41, ALT 17 in Aug 2022  -Continue prescription drug management with Crestor '40mg'$  daily  and Zetia '10mg'$  daily>refilled   6.  Syncope  - he has not had any further episodes of dizziness or syncope   Medication Adjustments/Labs and Tests Ordered: Current medicines are reviewed at length with the patient today.  Concerns regarding medicines are outlined above.  Tests Ordered: No orders of the defined types were placed in this encounter.  Medication Changes: No orders of the defined types were placed in this encounter.   Disposition:  Follow up in 6 month(s) virtual  Signed, Fransico Him, MD  07/02/2021 2:58 PM    [place]

## 2021-07-07 DIAGNOSIS — J441 Chronic obstructive pulmonary disease with (acute) exacerbation: Secondary | ICD-10-CM | POA: Diagnosis not present

## 2021-07-19 ENCOUNTER — Other Ambulatory Visit: Payer: Self-pay

## 2021-07-19 MED ORDER — EZETIMIBE 10 MG PO TABS: 10.0000 mg | ORAL_TABLET | Freq: Every day | ORAL | 11 refills | Status: DC

## 2021-07-23 ENCOUNTER — Other Ambulatory Visit: Payer: Self-pay

## 2021-07-23 ENCOUNTER — Ambulatory Visit: Payer: Medicare HMO | Admitting: Internal Medicine

## 2021-07-23 MED ORDER — EZETIMIBE 10 MG PO TABS: 10.0000 mg | ORAL_TABLET | Freq: Every day | ORAL | 3 refills | Status: AC

## 2021-08-06 DIAGNOSIS — J441 Chronic obstructive pulmonary disease with (acute) exacerbation: Secondary | ICD-10-CM | POA: Diagnosis not present

## 2021-08-09 ENCOUNTER — Ambulatory Visit: Payer: Medicare HMO | Admitting: Internal Medicine

## 2021-08-09 ENCOUNTER — Other Ambulatory Visit: Payer: Self-pay

## 2021-08-09 ENCOUNTER — Encounter: Payer: Self-pay | Admitting: Internal Medicine

## 2021-08-09 VITALS — BP 158/68 | HR 83 | Temp 98.4°F | Ht 63.0 in | Wt 194.0 lb

## 2021-08-09 DIAGNOSIS — I2781 Cor pulmonale (chronic): Secondary | ICD-10-CM | POA: Diagnosis not present

## 2021-08-09 DIAGNOSIS — J449 Chronic obstructive pulmonary disease, unspecified: Secondary | ICD-10-CM | POA: Diagnosis not present

## 2021-08-09 DIAGNOSIS — Z23 Encounter for immunization: Secondary | ICD-10-CM | POA: Diagnosis not present

## 2021-08-09 DIAGNOSIS — J9611 Chronic respiratory failure with hypoxia: Secondary | ICD-10-CM | POA: Diagnosis not present

## 2021-08-09 DIAGNOSIS — G4733 Obstructive sleep apnea (adult) (pediatric): Secondary | ICD-10-CM | POA: Diagnosis not present

## 2021-08-09 DIAGNOSIS — I519 Heart disease, unspecified: Secondary | ICD-10-CM

## 2021-08-09 MED ORDER — BREZTRI AEROSPHERE 160-9-4.8 MCG/ACT IN AERO: 2.0000 | INHALATION_SPRAY | Freq: Two times a day (BID) | RESPIRATORY_TRACT | 0 refills | Status: DC

## 2021-08-09 NOTE — Progress Notes (Signed)
HPI  IOV 05/01/2018  Chief Complaint  Patient presents with   Consult    Referred by Dr. Radford Pax due to SOB and COPD after diagnosis x2 years ago. Pt is a former [facility] pt last seen 12/27/13. Pt has c/o SOB that happens at anytime especially if he tries to rush and states he has an occ. CP. Denies any cough.    [name] , 87 y.o. , with dob 1930-03-31 and male ,Not Hispanic or Latino from [address] [place] [address] - presents to lung clinic for dyspnea, copd eval. He is 87 years old and referred by Dr. Radford Pax Hisdaughter is here with him. He tells me that he's had insidious onset of shortness of breath for the last several years. He is unable to quantify exactly. Nevertheless in the last few years is progressively worse despite being on Symbicort for the last few years. He has associated sleep apnea and is compliant with CPAP as managed by Dr. Radford Pax. He does have a mild cough but no sputum production. He has obesity but this has been stable. He denies much of smoking but this clear-cut emphysema on visualization of his images and on spirometry 4 years ago. His most recent hemoglobin appears okay.   CAT COPD Symptom & Quality of Life Score (GSK trademark) 0 is no burden. 5 is highest burden 05/01/2018   Never Cough -> Cough all the time 2  No phlegm in chest -> Chest is full of phlegm 2  No chest tightness -> Chest feels very tight 3  No dyspnea for 1 flight stairs/hill -> Very dyspneic for 1 flight of stairs 5  No limitations for ADL at home -> Very limited with ADL at home 3  Confident leaving home -> Not at all confident leaving home 3  Sleep soundly -> Do not sleep soundly because of lung condition 4  Lots of Energy -> No energy at all 3  TOTAL Score (max 40)  25      Spirometry in March 2015 and personally visualized image shows FEV1 0.97 L/45% with a ratio of 50% insistent with Gold stage III obstruction   Imaging that I personally visualized includes April  2019 chest x-ray that shows hyperinflation with possible bullous disease in the lung base. He has CT angiogram of the chest in February 2016 that ruled out pulmonary embolism. I personally visualized this and did not see any mass. He does have emphysema. Pulmonary embolism was ruled out.     Results for [name] (MRN [id number]) as of 05/01/2018 09:14  Ref. Range 01/26/2018 09:56  Hemoglobin Latest Ref Range: 13.0 - 17.7 g/dL 13.8  Results for [name] (MRN [id number]) as of 05/01/2018 09:14  Ref. Range 01/26/2018 09:56  Creatinine Latest Ref Range: 0.76 - 1.27 mg/dL 1.02    OV Aug 1976  87 year old male, former smoker (quit >50 yeas ago). PMH obesity, emphysema, OSA on CPAP. Patient of Dr. Chase Caller, last seen on 7/19.  Referred by Dr. Heron Nay for dyspnea and COPD eval. Reports progression of sob over the last several years despite being on symbicort. Mild cough with no sputum production. Evidence of emphysema on imaging and spirometry. CAT score 25. Spirometry 12/2013- FEV1 0.97, ratio 50% consistent with COPD GOLD III obstruction. Dr. Chase Caller felt dyspnea likely d/t obesity, physical deconditioning and diastolic dysfunction associated COPD. Plan repeat full PFTs to re-stage COPD, HRCT and alpha 1 antitypsin.   Tests: >>HRCT  05/11/18- IMPRESSION: No findings to suggest interstitial lung disease. Mild diffuse bronchial wall thickening with mild centrilobular and paraseptal emphysema; imaging findings compatible with the reported clinical history of COPD. >>PFTs 05/20/18- FVC 1.63 (60%), FEV1  0.77 (42%), Ratio 48 (65%)   Presents today for follow up dyspnea with his wife. Reports progressive worsening of breathing over the past few years. His main goal is to be able to fish. Continues Symbicort as prescribed. Wearing CPAP every night. Denies cough or wheeze.  Patient had pulmonary function test today showing obstruction, no bronchodilator response. Unable to complete DLCO or lung  volumes d/t patient not being able to perform the proper technique. HRCT completed showing no evidence of ILD.    OV 09/20/2020  Subjective:  Patient ID: [name], male , DOB: 1930-09-03 , age 87 y.o. , MRN: [id number] , ADDRESS: [address] [place] [address] PCP [name], MD Patient Care Team: [name], MD as PCP - General (Family Medicine) Sueanne Margarita, MD as PCP - Cardiology (Cardiology) Sueanne Margarita, MD as Attending Physician (Cardiology)  This Provider for this visit: Treatment Team:  Attending Provider: Brand Males, MD    09/20/2020 -   Chief Complaint  Patient presents with   Follow-up    Pt last seen 05/20/18. Pt has been having problems with fluid in lungs and also has had increased SOB.  Pt does now have oxygen provided by [employer] that he wears 2L as needed.     HPI [name] 87 y.o. -presents for routine follow-up.  He presents with his daughter.  He has 5 daughters no sons.  One of the daughters is here.  I last saw him 2 and half years ago and at that time got pulmonary function test that showed COPD.  He is followed up with nurse practitioner.  He got started on inhaler therapy.  After that because of the pandemic lost to follow-up.  Daughter tells me he has progressive worsening of shortness of breath.  He gets short of breath just walking from room to room.  Nevertheless COPD CAT score appears the same.  They are more concerned about progressive edema he now has anasarca with chronic lymphedema in the feet.  This is despite diuretics.  Therefore they have been referred to pulmonary to reestablish.  He has a history of diastolic heart failure with the last echocardiogram was in 2017.  He has a history of sleep apnea with some mild hypercarbia as documented below but he does not use a CPAP on a compliant fashion.  He admits that is easily.  His last CT scan of the chest was a few years ago did not show any ILD.  He is recently been  given oxygen by his primary care physician.  Inhalers have been changed around.  Primary care physician notes reviewed.  His most recent hemoglobin in April 2021 was normal at 14.5 g%.  Recent kidney function is stable.  He is quite functional.  He does not use a cane.  He is cognitively intact.     CAT Score 09/20/2020 05/01/2018  Total CAT Score 24 25    Results for ROMERO, LETIZIA (MRN [id number]) as of 09/20/2020 10:51  Ref. Range 07/07/2020 09:48  Creatinine Latest Ref Range: 0.76 - 1.27 mg/dL 1.18   Results for MALICHI, PALARDY (MRN [id number]) as of 09/20/2020 10:51  Ref. Range 09/06/2018 05:43 04/01/2019 10:25 04/01/2019 13:03 07/14/2019 11:08 07/07/2020 09:48  Hemoglobin Latest Ref Range:  13.0 - 17.0 g/dL 12.7 (L) 15.7       PFT Results Latest Ref Rng & Units 05/20/2018  FVC-Pre L 1.63  FVC-Predicted Pre % 60  FVC-Post L 1.71  FVC-Predicted Post % 63  Pre FEV1/FVC % % 48  Post FEV1/FCV % % 46  FEV1-Pre L 0.77  FEV1-Predicted Pre % 42  FEV1-Post L 0.79   IMPRESSION: 1. No findings to suggest interstitial lung disease. 2. Mild diffuse bronchial wall thickening with mild centrilobular and paraseptal emphysema; imaging findings compatible with the reported clinical history of COPD. 3. Aortic atherosclerosis, in addition to left main and 3 vessel coronary artery disease. 4. There are calcifications of the aortic valve. Echocardiographic correlation for evaluation of potential valvular dysfunction may be warranted if clinically indicated.   Aortic Atherosclerosis (ICD10-I70.0) and Emphysema (ICD10-J43.9).     Electronically Signed   By: Vinnie Langton M.D.   On: 05/11/2018 14:   ROS - per HPI  ECHO dec 2017   -------------------------------------------------------------------  Study Conclusions   - Left ventricle: The cavity size was normal. Wall thickness was    increased in a pattern of mild LVH. Systolic function was normal.    The estimated ejection fraction was  in the range of 60% to 65%.    Wall motion was normal; there were no regional wall motion    abnormalities. Doppler parameters are consistent with abnormal    left ventricular relaxation (grade 1 diastolic dysfunction). The    E/e&' ratio is between 8-15, suggesting indeterminate LV filling    pressure.  - Aortic valve: Poorly visualized. Sclerosis without stenosis.  - Left atrium: The atrium was normal in size.  - Tricuspid valve: There was trivial regurgitation.  - Pulmonary arteries: PA peak pressure: 30 mm Hg (S).  - Inferior vena cava: The vessel was normal in size. The    respirophasic diameter changes were in the normal range (>= 50%),    consistent with normal central venous pressure.   Impressions:   xxxxxxxxxxxxxxxx Results for BABATUNDE, SEAGO (MRN [id number]) as of 09/20/2020 10:51  Ref. Range 11/16/2014 22:55  Sample type Unknown ARTERIAL  pH, Arterial Latest Ref Range: 7.35 - 7.45  7.352  pCO2 arterial Latest Ref Range: 35 - 45 mmHg 52.4 (H)  pO2, Arterial Latest Ref Range: 80 - 100 mmHg 67.0 (L)    OV 11/17/2020  Subjective:  Patient ID: [name], male , DOB: Jan 20, 1930 , age 38 y.o. , MRN: [id number] , ADDRESS: [address] [place] [address] PCP [name], MD Patient Care Team: [name], MD as PCP - General (Family Medicine) Sueanne Margarita, MD as PCP - Cardiology (Cardiology) Sueanne Margarita, MD as Attending Physician (Cardiology)  This Provider for this visit: Treatment Team:  Attending Provider: Brand Males, MD    11/17/2020 -   Chief Complaint  Patient presents with   Follow-up    No complaints     HPI [name] 87 y.o. -returns for follow-up.  At this time he is accompanied by his daughter Vanita Ingles.  The other daughter [name] has had back surgery and could not attend.  Vanita Ingles does not live with him.  He tells me that he is stable.  He could not have a CT scan of the chest because he could not lie flat.  His pedal edema  continues.  There is the only thing that he want symptom relief from.  He has sleep apnea but is not using his  CPAP.  He refuses to use a CPAP.  He says his quality of life is fine without the CPAP.  He does not want a blood gas test done to see if he would qualify for BiPAP.  He had echocardiogram that shows shows diastolic dysfunction but is otherwise normal.  His pulmonary function test shows stability with gold stage III COPD.  He does not use oxygen at daytime or with exertion or at night.  He just is using his triple inhaler therapy.  He prefers basic symptom based approach care    CAT Score 11/17/2020 09/20/2020 05/01/2018  Total CAT Score 9 24 25        PFT  PFT Results Latest Ref Rng & Units 11/17/2020 05/20/2018  FVC-Pre L 1.77 1.63  FVC-Predicted Pre % 70 60  FVC-Post L 1.79 1.71  FVC-Predicted Post % 71 63  Pre FEV1/FVC % % 47 48  Post FEV1/FCV % % 49 46  FEV1-Pre L 0.84 0.77  FEV1-Predicted Pre % 49 42  FEV1-Post L 0.87 0.79  DLCO uncorrected ml/min/mmHg 10.65 -  DLCO UNC% % 57 -  DLCO corrected ml/min/mmHg 10.65 -  DLCO COR %Predicted % 57 -  DLVA Predicted % 81 -   06/11/2021 Follow up : COPD , O2 RF  Patient returns for a follow-up visit.  Patient is accompanied by his daughter.  Patient's daughter and son-in-law live with him at home.  She says that she has noticed over the last month or so his breathing has not been as good.  He gets short of breath with walking to the bathroom and has episodes where he gets panicked with how bad his shortness of breath is.  Patient does have oxygen at home that he uses at bedtime and with activities.  He also has a portable oxygen concentrator.  Patient admits he does not wear this unless he gets an attack of his shortness of breath. Patient is on BREZTRI but admits he does not use this on a regular basis.  He does have albuterol and DuoNeb nebulizer that he uses as needed.  He denies any discolored mucus, fever, chest pain, orthopnea. OV  08/09/2021  Subjective:  Patient ID: [name], male , DOB: [birth year] , age 44 y.o. , MRN: [id number] , ADDRESS: [place] [id number] PCP [name], MD Patient Care Team: [name], MD as PCP - General (Family Medicine) Sueanne Margarita, MD as PCP - Cardiology (Cardiology) Sueanne Margarita, MD as Attending Physician (Cardiology)  This Provider for this visit: Treatment Team:  Attending Provider: Brand Males, MD    08/09/2021 -   Chief Complaint  Patient presents with   Follow-up    Pt has had complaints of SOB with exertion but also states that it is happening at any time. Also has had an occ cough and some wheezing.   former smoker (quit >50 yeas ago). PMH obesity, emphysema, OSA on CPAP.  HPI [name] 87 y.o. -presents with the daughter [name].  [name] is a person living with him.  No new complaints.  Last seen in February 2022 and then saw nurse practitioner in August 2022.  Had a clear chest x-ray at that time.  He was not using his triple inhaler therapy brztril on regular basis.  He is now doing it regularly.  He supposed be taking oxygen and CPAP at night but he will not do it.  We recommended CT scan of the chest but he is  not able to lie flat for many years.  He does not want to pulmonary function test.  He did tell me that his life goal is to be at least 87 years old.  However he is very reluctant to do various investigations.  Overall he stable currently.  Takes his inhaler.  Oxygen is used only on subjective basis.  No new complaints he spends a day watching fishing channel.[place] Kitchen  He is content with his life.    CT Chest data  No results found.    PFT  PFT Results Latest Ref Rng & Units 11/17/2020 05/20/2018  FVC-Pre L 1.77 1.63  FVC-Predicted Pre % 70 60  FVC-Post L 1.79 1.71  FVC-Predicted Post % 71 63  Pre FEV1/FVC % % 47 48  Post FEV1/FCV % % 49 46  FEV1-Pre L 0.84 0.77  FEV1-Predicted Pre % 49 42  FEV1-Post L 0.87  0.79  DLCO uncorrected ml/min/mmHg 10.65 -  DLCO UNC% % 57 -  DLCO corrected ml/min/mmHg 10.65 -  DLCO COR %Predicted % 57 -  DLVA Predicted % 81 -       has a past medical history of Anxiety, Asthma, Chronic diastolic CHF (congestive heart failure) ([place]), COPD (chronic obstructive pulmonary disease) ([place]), Coronary artery disease, Diabetes mellitus, Diverticulitis (Oct. 2013), DVT (deep venous thrombosis) ([place]), GERD (gastroesophageal reflux disease), Hypercholesterolemia, Hypertension, Hypothyroidism, Obesity (BMI 30-39.9), OSA (obstructive sleep apnea), Peripheral vascular disease ([place]) (11/2006), Shortness of breath, and Sleep apnea.   reports that he quit smoking about 57 years ago. His smoking use included cigarettes and cigars. He has a 7.50 pack-year smoking history. He has never used smokeless tobacco.  Past Surgical History:  Procedure Laterality Date   ABDOMINAL AORTAGRAM N/A 04/20/2012   Procedure: ABDOMINAL Maxcine Ham;  Surgeon: Angelia Mould, MD;  [facility] C[facility] LAB;  Service: Cardiovascular;  Laterality: N/A;   ABDOMINAL AORTAGRAM N/A 12/29/2012   Procedure: ABDOMINAL Maxcine Ham;  Surgeon: Serafina Mitchell, MD;  Location: [facility] CATH LAB;  Service: Cardiovascular;  Laterality: N/A;   ABDOMINAL AORTOGRAM W/LOWER EXTREMITY Bilateral 03/16/2021   Procedure: ABDOMINAL AORTOGRAM W/LOWER EXTREMITY;  Surgeon: Angelia Mould, MD;  Location: [facility] CV LAB;  Service: Cardiovascular;  Laterality: Bilateral;   BALLOON DILATION  12/26/2011   Procedure: BALLOON DILATION;  Surgeon: Garlan Fair, MD;  Location: [facility] ENDOSCOPY;  Service: Endoscopy;  Laterality: N/A;   COLONOSCOPY  08/14/2012   Procedure: COLONOSCOPY;  Surgeon: Arta Silence, MD;  Location: [facility] ENDOSCOPY;  Service: Endoscopy;  Laterality: N/A;   CORONARY STENT PLACEMENT  2010 and 2012   ESOPHAGOGASTRODUODENOSCOPY  12/26/2011   Procedure: ESOPHAGOGASTRODUODENOSCOPY (EGD);  Surgeon: Garlan Fair, MD;  Location:  [facility] ENDOSCOPY;  Service: Endoscopy;  Laterality: N/A;   ESOPHAGOGASTRODUODENOSCOPY  08/13/2012   Procedure: ESOPHAGOGASTRODUODENOSCOPY (EGD);  Surgeon: Arta Silence, MD;  Location: [facility] ENDOSCOPY;  Service: Endoscopy;  Laterality: Left;   HERNIA REPAIR  1992   evntral hernia repair   LOWER EXTREMITY ANGIOGRAM Bilateral 12/29/2012   Procedure: LOWER EXTREMITY ANGIOGRAM;  Surgeon: Serafina Mitchell, MD;  Location: [facility] CATH LAB;  Service: Cardiovascular;  Laterality: Bilateral;  bilat lower extrem angio    Allergies  Allergen Reactions   Clopidogrel Itching    Immunization History  Administered Date(s) Administered   Influenza Split 08/01/2009, 08/16/2010, 07/14/2013   Influenza, High Dose Seasonal PF 06/19/2016, 07/07/2017, 11/05/2018, 08/11/2019   Influenza,inj,quad, With Preservative 07/04/2014   Influenza-Unspecified 09/24/2011, 07/01/2012, 08/20/2013   PFIZER(Purple Top)SARS-COV-2 Vaccination 12/27/2019, 01/19/2020, 02/23/2021   Pneumococcal Conjugate-13  08/20/2013   Pneumococcal Polysaccharide-23 01/13/1999   Pneumococcal-Unspecified 08/14/2013   Td 01/13/1999   Tdap 12/24/2011, 01/12/2019   Zoster, Live 10/23/2009    Family History  Problem Relation Age of Onset   Diabetes Mother    Hyperlipidemia Mother    Hypertension Mother    Heart attack Mother    Heart disease Mother        before age 31   Diabetes Brother    Heart disease Father        before age 31   Breast cancer Sister    Diabetes Sister    Hyperlipidemia Sister    Hypertension Sister    Heart attack Sister    Hyperlipidemia Sister    Hypertension Sister    Heart disease Sister        Heart Disease before age 33   Asthma Sister    Malignant hyperthermia Neg Hx      Current Outpatient Medications:    albuterol (PROVENTIL HFA;VENTOLIN HFA) 108 (90 BASE) MCG/ACT inhaler, Inhale 2 puffs into the lungs every 6 (six) hours as needed for wheezing or shortness of breath. , Disp: , Rfl:    albuterol (PROVENTIL)  (2.5 MG/3ML) 0.083% nebulizer solution, Take 3 mLs (2.5 mg total) by nebulization every 4 (four) hours as needed for wheezing or shortness of breath., Disp: 75 mL, Rfl: 0   aspirin EC 81 MG tablet, Take 81 mg by mouth daily., Disp: , Rfl:    Budeson-Glycopyrrol-Formoterol (BREZTRI AEROSPHERE) 160-9-4.8 MCG/ACT AERO, Inhale 2 puffs into the lungs in the morning and at bedtime., Disp: , Rfl:    ezetimibe (ZETIA) 10 MG tablet, Take 1 tablet (10 mg total) by mouth daily., Disp: 90 tablet, Rfl: 3   glimepiride (AMARYL) 2 MG tablet, Take 2 mg by mouth every evening. , Disp: , Rfl:    ipratropium-albuterol (DUONEB) 0.5-2.5 (3) MG/3ML SOLN, Take 3 mLs by nebulization every 4 (four) hours as needed (asthma)., Disp: , Rfl:    levothyroxine (SYNTHROID, LEVOTHROID) 150 MCG tablet, Take 150 mcg by mouth every evening. , Disp: , Rfl:    meloxicam (MOBIC) 15 MG tablet, Take 15 mg by mouth daily as needed for pain., Disp: , Rfl:    metoprolol succinate (TOPROL-XL) 50 MG 24 hr tablet, Take 1 tablet (50 mg total) by mouth daily. Take with or immediately following a meal., Disp: 90 tablet, Rfl: 3   nitroGLYCERIN (NITROSTAT) 0.4 MG SL tablet, Place 1 tablet (0.4 mg total) under the tongue every 5 (five) minutes as needed for chest pain., Disp: 25 tablet, Rfl: 6   rosuvastatin (CRESTOR) 20 MG tablet, Take 1 tablet (20 mg total) by mouth daily., Disp: 90 tablet, Rfl: 3      Objective:   Vitals:   08/09/21 1515  BP: (!) 158/68  Pulse: 83  Temp: 98.4 F (36.9 C)  TempSrc: Oral  SpO2: 91%  Weight: 194 lb (88 kg)  Height: 5\' 3"  (1.6 m)    Estimated body mass index is 34.37 kg/m as calculated from the following:   Height as of this encounter: 5\' 3"  (1.6 m).   Weight as of this encounter: 194 lb (88 kg).  @WEIGHTCHANGE @  [employer]   08/09/21 1515  Weight: 194 lb (88 kg)     Physical Exam  General: No distress. obese Neuro: Alert and Oriented x 3. GCS 15. Speech normal Psych: Pleasant Resp:   Barrel Chest - no.  Wheeze - no, Crackles - no, No overt respiratory distress  CVS: Normal heart sounds. Murmurs - no Ext: Stigmata of Connective Tissue Disease - no. 3+ edema chronic baseline HEENT: Normal upper airway. PEERL +. No post nasal drip        Assessment:       ICD-10-CM   1. Stage 3 severe COPD by GOLD classification ([place])  J44.9     2. Cor pulmonale, chronic (HCC)  I27.81     3. OSA (obstructive sleep apnea)  G47.33     4. Diastolic dysfunction, left ventricle  I51.9     5. Chronic respiratory failure with hypoxia (HCC)  J96.11          Plan:     Patient Instructions     ICD-10-CM   1. Dyspnea on exertion  R06.00   2. Stage 3 severe COPD by GOLD classification ([place])  J44.9   3. Cor pulmonale, chronic (HCC)  I27.81   4. Diastolic dysfunction, left ventricle  I51.9   5. OSA (obstructive sleep apnea)  G47.33   6. Hypercarbia  R06.89      Overall you are stable since last visit  Your sleep apnea and then severe COPD and both of this over a long period of time has caused stress on the right side of the heart adding to shortness of breath, carbon dioxide retention and also leg swelling   We could not assess for additional problems such as pulmonary fibrosis or lung cancer because you cannot do a CT scan of the chest [could not lie flat]  Respect the fact that you do not want any other additional investigations  Respect the fact that you wish to not use CPAP for your sleep apnea  (not used in a long time(  Noted using o2 inconsistently based on how you feel  Plan -Consider using night o2 consistently -Use BREZTRI daily inhaler -Use albuterol as needed -Talk to primary care physician about leg swelling management that includes potentially Lasix, compression stockings and leg elevation and make sure you do not let it get infected - High dose flu shot 08/09/2021 - Enjoy fishing channel  Follow-up 6-9 months or sooner if needed    SIGNATURE    Dr.  Brand Males, M.D., F.C.C.P,  Pulmonary and Critical Care Medicine Staff Physician, [place] Director - Interstitial Lung Disease  Program  Pulmonary [place] at [place], [place], [address]  Pager: [phone], If no answer or between  15:00h - 7:00h: call [phone] Telephone: [phone]  4:05 PM 08/09/2021

## 2021-08-09 NOTE — Patient Instructions (Addendum)
ICD-10-CM   1. Dyspnea on exertion  R06.00   2. Stage 3 severe COPD by GOLD classification ([place])  J44.9   3. Cor pulmonale, chronic (HCC)  I27.81   4. Diastolic dysfunction, left ventricle  I51.9   5. OSA (obstructive sleep apnea)  G47.33   6. Hypercarbia  R06.89      Overall you are stable since last visit  Your sleep apnea and then severe COPD and both of this over a long period of time has caused stress on the right side of the heart adding to shortness of breath, carbon dioxide retention and also leg swelling   We could not assess for additional problems such as pulmonary fibrosis or lung cancer because you cannot do a CT scan of the chest [could not lie flat]  Respect the fact that you do not want any other additional investigations  Respect the fact that you wish to not use CPAP for your sleep apnea  (not used in a long time(  Noted using o2 inconsistently based on how you feel  Plan -Consider using night o2 consistently -Use BREZTRI daily inhaler -Use albuterol as needed -Talk to primary care physician about leg swelling management that includes potentially Lasix, compression stockings and leg elevation and make sure you do not let it get infected - High dose flu shot 08/09/2021 - Enjoy fishing channel  Follow-up 6-9 months or sooner if needed

## 2021-08-09 NOTE — Addendum Note (Signed)
Addended by: Lorretta Harp on: 08/09/2021 04:24 PM   Modules accepted: Orders

## 2021-09-06 DIAGNOSIS — J441 Chronic obstructive pulmonary disease with (acute) exacerbation: Secondary | ICD-10-CM | POA: Diagnosis not present

## 2021-09-10 DIAGNOSIS — R6889 Other general symptoms and signs: Secondary | ICD-10-CM | POA: Diagnosis not present

## 2021-09-10 DIAGNOSIS — E1159 Type 2 diabetes mellitus with other circulatory complications: Secondary | ICD-10-CM | POA: Diagnosis not present

## 2021-09-10 DIAGNOSIS — E039 Hypothyroidism, unspecified: Secondary | ICD-10-CM | POA: Diagnosis not present

## 2021-09-10 DIAGNOSIS — I5030 Unspecified diastolic (congestive) heart failure: Secondary | ICD-10-CM | POA: Diagnosis not present

## 2021-09-10 DIAGNOSIS — I2510 Atherosclerotic heart disease of native coronary artery without angina pectoris: Secondary | ICD-10-CM | POA: Diagnosis not present

## 2021-09-10 DIAGNOSIS — E782 Mixed hyperlipidemia: Secondary | ICD-10-CM | POA: Diagnosis not present

## 2021-09-10 DIAGNOSIS — N183 Chronic kidney disease, stage 3 unspecified: Secondary | ICD-10-CM | POA: Diagnosis not present

## 2021-09-10 DIAGNOSIS — I130 Hypertensive heart and chronic kidney disease with heart failure and stage 1 through stage 4 chronic kidney disease, or unspecified chronic kidney disease: Secondary | ICD-10-CM | POA: Diagnosis not present

## 2021-09-10 DIAGNOSIS — F411 Generalized anxiety disorder: Secondary | ICD-10-CM | POA: Diagnosis not present

## 2021-09-10 DIAGNOSIS — I739 Peripheral vascular disease, unspecified: Secondary | ICD-10-CM | POA: Diagnosis not present

## 2021-09-10 DIAGNOSIS — Z0000 Encounter for general adult medical examination without abnormal findings: Secondary | ICD-10-CM | POA: Diagnosis not present

## 2021-09-11 DIAGNOSIS — N183 Chronic kidney disease, stage 3 unspecified: Secondary | ICD-10-CM | POA: Diagnosis not present

## 2021-10-06 DIAGNOSIS — J441 Chronic obstructive pulmonary disease with (acute) exacerbation: Secondary | ICD-10-CM | POA: Diagnosis not present

## 2021-10-30 ENCOUNTER — Other Ambulatory Visit: Payer: Self-pay | Admitting: Family Medicine

## 2021-10-30 ENCOUNTER — Ambulatory Visit
Admission: RE | Admit: 2021-10-30 | Discharge: 2021-10-30 | Disposition: A | Discharge status: Home / Self Care | Payer: Medicare HMO | Source: Ambulatory Visit | Attending: Family Medicine | Admitting: Family Medicine

## 2021-10-30 DIAGNOSIS — J9691 Respiratory failure, unspecified with hypoxia: Secondary | ICD-10-CM | POA: Diagnosis not present

## 2021-10-30 DIAGNOSIS — R0902 Hypoxemia: Secondary | ICD-10-CM

## 2021-10-30 DIAGNOSIS — J441 Chronic obstructive pulmonary disease with (acute) exacerbation: Secondary | ICD-10-CM | POA: Diagnosis not present

## 2021-10-30 DIAGNOSIS — Z20822 Contact with and (suspected) exposure to covid-19: Secondary | ICD-10-CM | POA: Diagnosis not present

## 2021-10-30 DIAGNOSIS — R0602 Shortness of breath: Secondary | ICD-10-CM | POA: Diagnosis not present

## 2021-10-30 NOTE — Progress Notes (Signed)
History of Present Illness [name] is a 88 year old male former smoker followed for severe COPD with emphysema and oxygen dependent respiratory failure, obstructive sleep apnea CPAP intolerant. He is followed by Dr. Chase Simon.  Medical history significant for diastolic heart failure coronary artery disease, peripheral vascular disease, diabetes Maintenance :Breztri Rescue :Albuterol , Duo Nebs  Home Oxygen at 2 L >> Dr. Brigitte Simon increased this to 3 L yesterday 10/30/2021 because he is flaring.     10/31/2021 Pt. Presents for follow up. He was last seen 06/11/2021 by Dr. Chase Simon. Pt. Has been sick. He was seen by his PCP 10/31/2021.He had some stroke like symptoms. Dr. Brigitte Simon is working these up. When Dr. Brigitte Simon saw the patient she did not hear a lot of air movement, noted wheezing and wanted him seen today in the pulmonary office.Marland Kitchen He is here with his daughter. His breathing seemed to get worse after Thanksgiving as he was sick at that time with viral type symptoms, no fever, but just felt bad. He has generally become more short of breath since Thanksgiving.  He is more short of breath and needs more rescue medication. He is taking Breztri, but not every day. He is using his rescue inhaler more than 6 times daily, which we discussed was too frequently. He has not been using his Duonebs x 3 months. No swelling in legs or ankles. He did get an injection of DepoMedrol at Dr. Raul Simon office 1/17, and he states that this did help. He also had a CXR with showed no acute disease. He does still have an expiratory wheeze on exam today. He does note that when he has a deep cough, he has some discolored secretions.   Test Results: CXR 10/30/2021 Lungs are hyperinflated as can be seen with COPD. 7 mm left mid lung pulmonary nodule unchanged from 07/13/2014. No focal consolidation. No pleural effusion or pneumothorax. Heart and mediastinal contours are unremarkable.   No acute osseous abnormality.    IMPRESSION: No active cardiopulmonary disease.  >>HRCT 05/11/18- IMPRESSION: No findings to suggest interstitial lung disease. Mild diffuse bronchial wall thickening with mild centrilobular and paraseptal emphysema; imaging findings compatible with the reported clinical history of COPD.   >>PFTs 05/20/18- FVC 1.63 (60%), FEV1  0.77 (42%), Ratio 48 (65%)    CBC Latest Ref Rng & Units 06/02/2021 03/16/2021 04/01/2019  WBC 4.0 - 10.5 K/uL 8.4 - 5.4  Hemoglobin 13.0 - 17.0 g/dL 15.1 15.3 15.7  Hematocrit 39.0 - 52.0 % 47.3 45.0 48.5  Platelets 150 - 400 K/uL 124(L) - 88(L)    BMP Latest Ref Rng & Units 06/02/2021 04/10/2021 03/16/2021  Glucose 70 - 99 mg/dL 105(H) 89 141(H)  BUN 8 - 23 mg/dL 15 11 11   Creatinine 0.61 - 1.24 mg/dL 1.06 1.04 1.00  BUN/Creat Ratio 10 - 24 - - -  Sodium 135 - 145 mmol/L 140 141 141  Potassium 3.5 - 5.1 mmol/L 3.9 4.3 4.0  Chloride 98 - 111 mmol/L 102 103 98  CO2 22 - 32 mmol/L 29 33(H) -  Calcium 8.9 - 10.3 mg/dL 9.8 9.4 -    BNP    Component Value Date/Time   BNP 73.4 12/28/2020 1143    ProBNP    Component Value Date/Time   PROBNP 103 01/26/2018 0956   PROBNP 33.0 11/16/2014 1124    PFT    Component Value Date/Time   FEV1PRE 0.84 11/17/2020 1255   FEV1POST 0.87 11/17/2020 1255   FVCPRE 1.77 11/17/2020 1255  FVCPOST 1.79 11/17/2020 1255   DLCOUNC 10.65 11/17/2020 1255   PREFEV1FVCRT 47 11/17/2020 1255   PSTFEV1FVCRT 49 11/17/2020 1255    DG Chest 2 View  Result Date: 10/30/2021 CLINICAL DATA:  Increasing shortness of breath, hypoxia EXAM: CHEST - 2 VIEW COMPARISON:  06/11/2021, 07/13/2014 FINDINGS: Lungs are hyperinflated as can be seen with COPD. 7 mm left mid lung pulmonary nodule unchanged from 07/13/2014. No focal consolidation. No pleural effusion or pneumothorax. Heart and mediastinal contours are unremarkable. No acute osseous abnormality. IMPRESSION: No active cardiopulmonary disease. Electronically Signed   By: [name] M.D.   On:  10/30/2021 15:07     Past medical hx Past Medical History:  Diagnosis Date   Anxiety    Asthma    Chronic diastolic CHF (congestive heart failure) (HCC)    diastolic    COPD (chronic obstructive pulmonary disease) ([place])    Coronary artery disease    s/p PCI of RCA 03/2009 at which time there was 70% ramus and 90% RCA, cath 01/2011 showed patent stents in RCA with moderate prox disesae, aneurysmal left circ and small 90% ramus s/p PCI, patent LAD EF 55%   Diabetes mellitus    Diverticulitis Oct. 2013   bleeding in the past and Effient for his CAD was stopped   DVT (deep venous thrombosis) (HCC)    GERD (gastroesophageal reflux disease)    Hypercholesterolemia    Hypertension    Hypothyroidism    Obesity (BMI 30-39.9)    OSA (obstructive sleep apnea)    severe on CPAP   Peripheral vascular disease ([place]) 11/2006   s/p left stent   Shortness of breath    Sleep apnea    severe OSA awaiting CPAP titration     Social History   Tobacco Use   Smoking status: Former    Packs/day: 0.50    Years: 15.00    Pack years: 7.50    Types: Cigarettes, Cigars    Quit date: 10/15/1963    Years since quitting: 58.0   Smokeless tobacco: Never  Vaping Use   Vaping Use: Never used  Substance Use Topics   Alcohol use: No   Drug use: No    [name] reports that he quit smoking about 58 years ago. His smoking use included cigarettes and cigars. He has a 7.50 pack-year smoking history. He has never used smokeless tobacco. He reports that he does not drink alcohol and does not use drugs.  Tobacco Cessation: Former smoker with a 15 pack year smoking history, quit > 15 years ago.    Past surgical hx, Family hx, Social hx all reviewed.  Current Outpatient Medications on File Prior to Visit  Medication Sig   albuterol (PROVENTIL HFA;VENTOLIN HFA) 108 (90 BASE) MCG/ACT inhaler Inhale 2 puffs into the lungs every 6 (six) hours as needed for wheezing or shortness of breath.    albuterol (PROVENTIL)  (2.5 MG/3ML) 0.083% nebulizer solution Take 3 mLs (2.5 mg total) by nebulization every 4 (four) hours as needed for wheezing or shortness of breath.   aspirin EC 81 MG tablet Take 81 mg by mouth daily.   Budeson-Glycopyrrol-Formoterol (BREZTRI AEROSPHERE) 160-9-4.8 MCG/ACT AERO Inhale 2 puffs into the lungs in the morning and at bedtime.   ezetimibe (ZETIA) 10 MG tablet Take 1 tablet (10 mg total) by mouth daily.   glimepiride (AMARYL) 2 MG tablet Take 2 mg by mouth every evening.    ipratropium-albuterol (DUONEB) 0.5-2.5 (3) MG/3ML SOLN Take 3 mLs by nebulization every  4 (four) hours as needed (asthma).   levothyroxine (SYNTHROID, LEVOTHROID) 150 MCG tablet Take 150 mcg by mouth every evening.    meloxicam (MOBIC) 15 MG tablet Take 15 mg by mouth daily as needed for pain.   metoprolol succinate (TOPROL-XL) 50 MG 24 hr tablet Take 1 tablet (50 mg total) by mouth daily. Take with or immediately following a meal.   nitroGLYCERIN (NITROSTAT) 0.4 MG SL tablet Place 1 tablet (0.4 mg total) under the tongue every 5 (five) minutes as needed for chest pain.   rosuvastatin (CRESTOR) 20 MG tablet Take 1 tablet (20 mg total) by mouth daily.   No current facility-administered medications on file prior to visit.     Allergies  Allergen Reactions   Clopidogrel Itching    Review Of Systems:  Constitutional:   No  weight loss, night sweats,  Fevers, chills, + fatigue, or  lassitude.  HEENT:   No headaches,  Difficulty swallowing,  Tooth/dental problems, or  Sore throat,                No sneezing, itching, ear ache, nasal congestion, post nasal drip,   CV:  No chest pain,  Orthopnea, PND, swelling in lower extremities, anasarca, dizziness, palpitations, syncope.   GI  No heartburn, indigestion, abdominal pain, nausea, vomiting, diarrhea, change in bowel habits, loss of appetite, bloody stools.   Resp: + shortness of breath with exertion or at rest.  + excess mucus, + productive cough,  No  non-productive cough,  No coughing up of blood.  + change in color of mucus.  + wheezing.  No chest wall deformity  Skin: no rash or lesions.  GU: no dysuria, change in color of urine, no urgency or frequency.  No flank pain, no hematuria   MS:  No joint pain or swelling.  + decreased range of motion.  No back pain.  Psych:  No change in mood or affect. No depression or anxiety.  No memory loss.   Vital Signs BP 128/70    Simon 98    Ht 5\' 3"  (1.6 m)    Wt 188 lb (85.3 kg)    SpO2 99% Comment: on 3L   BMI 33.30 kg/m    Physical Exam:  General- No distress,  A&Ox3, pleasant ENT: No sinus tenderness, TM clear, pale nasal mucosa, no oral exudate,no post nasal drip, no LAN Cardiac: S1, S2, regular rate and rhythm, no murmur Chest: + expiratory  wheeze/ no rales/ + dullness and diminished breath sounds in the bases of his lungs, prolonged expiratory phase; no accessory muscle use, no nasal flaring, no sternal retractions Abd.: Soft Non-tender, ND, BS +, Body mass index is 33.3 kg/m. Ext: No clubbing cyanosis, trace baseline edema Neuro:  normal strength, MAE x 4, A&O x 3 Skin: No rashes, warm and dry, no lesions  Psych: normal mood and behavior   Assessment/Plan COPD exacerbation  Non-compliance with maintenance inhaler  Plan We will send in a prescription for Doxycycline 100 mg twice daily x 7 days.  Take until gone  Take one Probiotic daily while on antibiotic. ( [place]) If you go outside , please wear sunblock.  Prednisone taper; 10 mg tablets:  3 tabs x 2 days, 2 tabs x 2 days 1 tab x 2 days then stop.  Watch blood sugars while on prednisone Increase oxygen to 3 L as needed for oxygen saturation of < 90%.  Continue Breztri 2 puffs twice daily without fail.  Rinse mouth after use  Albuterol for breakthrough shortness of breath Please use Nebulizer treatment in the morning and evening while you are sick.  Follow up in 1 week with Judson Roch NP, to ensure you are better.   Please contact office for sooner follow up if symptoms do not improve or worsen or seek emergency care    I spent 40 minutes dedicated to the care of this patient on the date of this encounter to include pre-visit review of records, face-to-face time with the patient discussing conditions above, post visit ordering of testing, clinical documentation with the electronic health record, making appropriate referrals as documented, and communicating necessary information to the patient's healthcare team.    Magdalen Spatz, NP 10/31/2021  5:19 PM

## 2021-10-31 ENCOUNTER — Encounter: Payer: Self-pay | Admitting: Acute Care

## 2021-10-31 ENCOUNTER — Ambulatory Visit: Payer: Medicare HMO | Admitting: Acute Care

## 2021-10-31 ENCOUNTER — Other Ambulatory Visit: Payer: Self-pay

## 2021-10-31 VITALS — BP 128/70 | HR 98 | Ht 63.0 in | Wt 188.0 lb

## 2021-10-31 DIAGNOSIS — J441 Chronic obstructive pulmonary disease with (acute) exacerbation: Secondary | ICD-10-CM

## 2021-10-31 MED ORDER — PREDNISONE 10 MG PO TABS: ORAL_TABLET | ORAL | 0 refills | Status: DC

## 2021-10-31 MED ORDER — DOXYCYCLINE HYCLATE 100 MG PO TABS: 100.0000 mg | ORAL_TABLET | Freq: Two times a day (BID) | ORAL | 0 refills | Status: DC

## 2021-10-31 NOTE — Patient Instructions (Addendum)
It is good to see you today. We will send in a prescription for Doxycycline 100 mg twice daily x 7 days.  Take until gone  Take one Probiotic daily while on antibiotic. ( [place]) If you go outside , please wear sunblock.  Prednisone taper; 10 mg tablets:  3 tabs x 2 days, 2 tabs x 2 days 1 tab x 2 days then stop.  Watch blood sugars while on prednisone Increase oxygen to 3 L as needed for oxygen saturation of < 90%.  Continue Breztri 2 puffs twice daily without fail.  Rinse mouth after use Albuterol for breakthrough shortness of breath Please use Nebulizer treatment in the morning and evening while you are sick.  Follow up in 1 week with Judson Roch NP, to ensure you are better.  Please contact office for sooner follow up if symptoms do not improve or worsen or seek emergency care

## 2021-11-06 DIAGNOSIS — J441 Chronic obstructive pulmonary disease with (acute) exacerbation: Secondary | ICD-10-CM | POA: Diagnosis not present

## 2021-11-07 ENCOUNTER — Ambulatory Visit: Payer: Medicare HMO | Admitting: Acute Care

## 2021-11-28 ENCOUNTER — Other Ambulatory Visit: Payer: Self-pay

## 2021-11-28 ENCOUNTER — Ambulatory Visit (HOSPITAL_COMMUNITY)
Admission: RE | Admit: 2021-11-28 | Discharge: 2021-11-28 | Disposition: A | Discharge status: Home / Self Care | Payer: Medicare HMO | Source: Ambulatory Visit | Attending: Internal Medicine | Admitting: Internal Medicine

## 2021-11-28 ENCOUNTER — Encounter (HOSPITAL_COMMUNITY): Payer: Self-pay | Admitting: Internal Medicine

## 2021-11-28 VITALS — BP 130/70 | HR 79 | Wt 190.0 lb

## 2021-11-28 DIAGNOSIS — Z8679 Personal history of other diseases of the circulatory system: Secondary | ICD-10-CM | POA: Insufficient documentation

## 2021-11-28 DIAGNOSIS — Z7984 Long term (current) use of oral hypoglycemic drugs: Secondary | ICD-10-CM | POA: Diagnosis not present

## 2021-11-28 DIAGNOSIS — J449 Chronic obstructive pulmonary disease, unspecified: Secondary | ICD-10-CM | POA: Insufficient documentation

## 2021-11-28 DIAGNOSIS — E039 Hypothyroidism, unspecified: Secondary | ICD-10-CM | POA: Insufficient documentation

## 2021-11-28 DIAGNOSIS — K219 Gastro-esophageal reflux disease without esophagitis: Secondary | ICD-10-CM | POA: Insufficient documentation

## 2021-11-28 DIAGNOSIS — R0789 Other chest pain: Secondary | ICD-10-CM | POA: Diagnosis not present

## 2021-11-28 DIAGNOSIS — Z87891 Personal history of nicotine dependence: Secondary | ICD-10-CM | POA: Insufficient documentation

## 2021-11-28 DIAGNOSIS — Z8719 Personal history of other diseases of the digestive system: Secondary | ICD-10-CM | POA: Diagnosis not present

## 2021-11-28 DIAGNOSIS — R29898 Other symptoms and signs involving the musculoskeletal system: Secondary | ICD-10-CM

## 2021-11-28 DIAGNOSIS — I5032 Chronic diastolic (congestive) heart failure: Secondary | ICD-10-CM

## 2021-11-28 DIAGNOSIS — I868 Varicose veins of other specified sites: Secondary | ICD-10-CM | POA: Insufficient documentation

## 2021-11-28 DIAGNOSIS — Z79899 Other long term (current) drug therapy: Secondary | ICD-10-CM | POA: Diagnosis not present

## 2021-11-28 DIAGNOSIS — I110 Hypertensive heart disease with heart failure: Secondary | ICD-10-CM | POA: Diagnosis not present

## 2021-11-28 DIAGNOSIS — R609 Edema, unspecified: Secondary | ICD-10-CM | POA: Diagnosis not present

## 2021-11-28 DIAGNOSIS — E1151 Type 2 diabetes mellitus with diabetic peripheral angiopathy without gangrene: Secondary | ICD-10-CM | POA: Diagnosis not present

## 2021-11-28 DIAGNOSIS — I509 Heart failure, unspecified: Secondary | ICD-10-CM | POA: Diagnosis not present

## 2021-11-28 DIAGNOSIS — R079 Chest pain, unspecified: Secondary | ICD-10-CM | POA: Insufficient documentation

## 2021-11-28 DIAGNOSIS — G4733 Obstructive sleep apnea (adult) (pediatric): Secondary | ICD-10-CM | POA: Insufficient documentation

## 2021-11-28 DIAGNOSIS — Z7982 Long term (current) use of aspirin: Secondary | ICD-10-CM | POA: Diagnosis not present

## 2021-11-28 DIAGNOSIS — I2510 Atherosclerotic heart disease of native coronary artery without angina pectoris: Secondary | ICD-10-CM | POA: Diagnosis not present

## 2021-11-28 DIAGNOSIS — Z955 Presence of coronary angioplasty implant and graft: Secondary | ICD-10-CM | POA: Insufficient documentation

## 2021-11-28 DIAGNOSIS — R531 Weakness: Secondary | ICD-10-CM | POA: Diagnosis not present

## 2021-11-28 DIAGNOSIS — E785 Hyperlipidemia, unspecified: Secondary | ICD-10-CM | POA: Diagnosis not present

## 2021-11-28 NOTE — Progress Notes (Signed)
Advanced Heart Failure Clinic Note    PCP: [name], MD PCP-Cardiologist: Fransico Him, MD  HF Cardiologist: Dr. Haroldine Laws  HPI:  Mr, Stailey is an 88 yo male with HFpEF, COPD has PRN oxygen at home for exertion, DM2, HTN, HLD, CAD s/p DES x4, hypothyroidism, PAD s/p LLE stent, severe OSA, diverticulitis w/ bleeding, GERD. Referred by Dr. Brigitte Pulse for further evaluation of LE edema.   Followed by Dr. Chase Caller for COPD.  PFTs 05/20/18- FVC 1.63 (60%), FEV1  0.77 (42%), Ratio 48 (65%)  HRCT 05/11/18 No findings to suggest ILD. Mild diffuse bronchial wall thickening with mild centrilobular and paraseptal emphysema.   Had PFT's 11/2020 with stable severe irway obstruction.  Had repeat ECHO on 09/2020 with LVEF 55-60%, G1DD, no valvular pathology, Normal RV function and pressure, No TR  Personally reviewed  Seen in 2/22 for first visit due to LE edema. Felt to be most likely related to venous insufficiency. U/s negative for DVT. Referred to [facility].   5/22. Saw Dr. Scot Dock in [facility] and felt to have combined arterial and venous disease.    Underwent abdominal aortogram and LE 03/16/21 with Dr. Scot Dock. He has severe infrainguinal arterial occlusive disease. He is not a candidate for open surgery or endovascular approach.  Today he returns to clinic with his daughter. Says he hasn't been doing well lately. Says he has been having CPs for several weeks. 3 weeks ago had episode where his left arm went limp and couldn't lift it. Refused to go to the ER. Arm got better the next day. No other neuro deficit. Subsequently developed SOB and wheezing. Seen in Pulmonary clinic. CXR ok. Treated for COPD flare and breathing improved. Denies SOB or any further CP for several weeks. LE edema. Wears O2 chronically can't walk too far.     Past Medical History:  Diagnosis Date   Anxiety    Asthma    Chronic diastolic CHF (congestive heart failure) (HCC)    diastolic    COPD (chronic obstructive pulmonary disease)  ([place])    Coronary artery disease    s/p PCI of RCA 03/2009 at which time there was 70% ramus and 90% RCA, cath 01/2011 showed patent stents in RCA with moderate prox disesae, aneurysmal left circ and small 90% ramus s/p PCI, patent LAD EF 55%   Diabetes mellitus    Diverticulitis Oct. 2013   bleeding in the past and Effient for his CAD was stopped   DVT (deep venous thrombosis) (HCC)    GERD (gastroesophageal reflux disease)    Hypercholesterolemia    Hypertension    Hypothyroidism    Obesity (BMI 30-39.9)    OSA (obstructive sleep apnea)    severe on CPAP   Peripheral vascular disease ([place]) 11/2006   s/p left stent   Shortness of breath    Sleep apnea    severe OSA awaiting CPAP titration    Current Outpatient Medications  Medication Sig Dispense Refill   albuterol (PROVENTIL HFA;VENTOLIN HFA) 108 (90 BASE) MCG/ACT inhaler Inhale 2 puffs into the lungs every 6 (six) hours as needed for wheezing or shortness of breath.      albuterol (PROVENTIL) (2.5 MG/3ML) 0.083% nebulizer solution Take 3 mLs (2.5 mg total) by nebulization every 4 (four) hours as needed for wheezing or shortness of breath. 75 mL 0   aspirin EC 81 MG tablet Take 81 mg by mouth daily.     Budeson-Glycopyrrol-Formoterol (BREZTRI AEROSPHERE) 160-9-4.8 MCG/ACT AERO Inhale 2 puffs into the lungs in  the morning and at bedtime.     ezetimibe (ZETIA) 10 MG tablet Take 1 tablet (10 mg total) by mouth daily. 90 tablet 3   glimepiride (AMARYL) 2 MG tablet Take 2 mg by mouth every evening.      ipratropium-albuterol (DUONEB) 0.5-2.5 (3) MG/3ML SOLN Take 3 mLs by nebulization every 4 (four) hours as needed (asthma).     levothyroxine (SYNTHROID, LEVOTHROID) 150 MCG tablet Take 150 mcg by mouth every evening.      meloxicam (MOBIC) 15 MG tablet Take 15 mg by mouth daily as needed for pain.     metoprolol succinate (TOPROL-XL) 50 MG 24 hr tablet Take 1 tablet (50 mg total) by mouth daily. Take with or immediately following a meal. 90  tablet 3   nitroGLYCERIN (NITROSTAT) 0.4 MG SL tablet Place 1 tablet (0.4 mg total) under the tongue every 5 (five) minutes as needed for chest pain. 25 tablet 6   OXYGEN Inhale 3 application into the lungs. 3 liters n/c     rosuvastatin (CRESTOR) 20 MG tablet Take 1 tablet (20 mg total) by mouth daily. 90 tablet 3   No current facility-administered medications for this encounter.    Allergies  Allergen Reactions   Clopidogrel Itching   Social History   Socioeconomic History   Marital status: Widowed    Spouse name: Not on file   Number of children: Not on file   Years of education: Not on file   Highest education level: Not on file  Occupational History   Not on file  Tobacco Use   Smoking status: Former    Packs/day: 0.50    Years: 15.00    Pack years: 7.50    Types: Cigarettes, Cigars    Quit date: 10/15/1963    Years since quitting: 76.1   Smokeless tobacco: Never  Vaping Use   Vaping Use: Never used  Substance and Sexual Activity   Alcohol use: No   Drug use: No   Sexual activity: Never  Other Topics Concern   Not on file  Social History Narrative   Not on file   Social Determinants of Health   Financial Resource Strain: Not on file  Food Insecurity: Not on file  Transportation Needs: Not on file  Physical Activity: Not on file  Stress: Not on file  Social Connections: Not on file  Intimate Partner Violence: Not on file   Family History  Problem Relation Age of Onset   Diabetes Mother    Hyperlipidemia Mother    Hypertension Mother    Heart attack Mother    Heart disease Mother        before age 50   Diabetes Brother    Heart disease Father        before age 29   Breast cancer Sister    Diabetes Sister    Hyperlipidemia Sister    Hypertension Sister    Heart attack Sister    Hyperlipidemia Sister    Hypertension Sister    Heart disease Sister        Heart Disease before age 71   Asthma Sister    Malignant hyperthermia Neg Hx    BP 130/70     Pulse 79    Wt 86.2 kg (190 lb)    SpO2 97% Comment: 3 l n/c   BMI 33.66 kg/m   Wt Readings from Last 3 Encounters:  11/28/21 86.2 kg (190 lb)  10/31/21 85.3 kg (188 lb)  08/09/21 88 kg (  194 lb)   PHYSICAL EXAM: General:  Elderly male. NAD. No resp difficulty, on oxygen, arrived in wheelchair HEENT: normal Neck: supple. no JVD. Carotids 2+ bilat; no bruits. No lymphadenopathy or thryomegaly appreciated. Cor: PMI nondisplaced. Regular rate & rhythm. No rubs, gallops or murmurs. Lungs: clear but decreased throughout Abdomen: obese soft, nontender, nondistended. No hepatosplenomegaly. No bruits or masses. Good bowel sounds. Extremities: no cyanosis, clubbing, rash, edema Neuro: alert & orientedx3, cranial nerves grossly intact. moves all 4 extremities w/o difficulty. Affect pleasant  ECG Sinus rhythm + PACs + RBBB. No ischemic changes. Personally reviewed  ASSESSMENT & PLAN:  1. Chest pain - has h/o CAD - Pain now improved. - Will check echo and Lexiscan myoview - ECG stable today  2. L arm weakness - now resolved - concerning for TIA versus musculoskeletal - will check carotid u/s - follow PCP +/- Neurology  3. LE edema - REDS lung water is normal 32% suggest this is not likely predominant left heart failure currently  - Given severe COPD, is at risk for PAH and RV failure, but echo not suggestive of this. - Given assymmetric edema and presence of LE varicosities suspect this is primarily venous insufficiency. - He has seen Dr. Scot Dock in [facility] who feels a combination of severe PAD and venous insufficiency. Options limited and Mr. Thall does not want to pursue any of them.  - Volume status looks god today on torsemide  3. COPD, severe - Likely main source of his dyspnea along with deconditioning. - PFT's with stable obstructive disease, no DLCO values available. - Followed by Dr. Chase Caller. - Continue triple therapy inhalers and nebs.    4. OSA not on CPAP: - Says he  will not wear his CPAP.  4. CAD s/p DES x4: - Recent CP -> plan as above - Continue asa, zetia, Crestor.  5. PAD: - Follows with Dr. Scot Dock.   Glori Bickers, MD  11/28/21

## 2021-11-28 NOTE — Patient Instructions (Addendum)
Thank you for your visit today.  Your physician has requested that you have an echocardiogram. Echocardiography is a painless test that uses sound waves to create images of your heart. It provides your doctor with information about the size and shape of your heart and how well your hearts chambers and valves are working. This procedure takes approximately one hour. There are no restrictions for this procedure.  How to Prepare for Your Myoview Test (stress test):  Nothing to eat or drink, except water, 4 hours prior to arrival time.  NO caffeine/decaffeinated products, or chocolate 12 hours prior to arrival. [name], please do not wear dresses.  Skirts or pants are approprate, please wear a short sleeve shirt. NO perfume, cologne or lotion Wear comfortable walking shoes.  NO HEELS! Total time is 3 to 4 hours; you may want to bring reading material for the waiting time. Please report to [facility] for your test  What to expect after you arrive:  Once you arrive and check in for your appointment an IV will be started in your arm.  Then the Technoligist will inject a small amount of radioactive tracer.  There will be a 1 hour waiting period after this injection.  A series of pictures will be taken of your heart following this waiting period.  You will be prepped for the stress portion of the test.  During the stress portion of your test you will either walk on a treadmill or receive a small, safe amount of radioactive tracer injected in your IV.  After the stress portion, there is a short rest period during which time your heart and blood pressure will be monitored.  After the short rest period the Technologist will begin your second set of pictures.  Your doctor will inform you of your test results within 7-10 business days.  In preparation for your appointment, medication and supplies will be purchased.  Appointment availability is limited, so if you need to cancel or reschedule please call the  office at [phone] 24 hours in advance to avoid a cancellation fee of $100.00  IF [place], [place] TECHNOLOGIST.  Your provider would like you to have a Carotid Ultrasound.  They will call you to schedule the appointment.   Your physician recommends that you schedule a follow-up appointment in: 6 months (August 2023)  **please call the office in June to arrange your appointment**   If you have any questions or concerns before your next appointment please send [place] a message through [place] or call our office at [phone].    TO LEAVE A MESSAGE FOR THE NURSE SELECT OPTION 2, PLEASE LEAVE A MESSAGE INCLUDING: YOUR NAME DATE OF BIRTH CALL BACK NUMBER REASON FOR CALL**this is important as we prioritize the call backs  YOU WILL RECEIVE A CALL BACK THE SAME DAY AS LONG AS YOU CALL BEFORE 4:00 PM  At the [facility], you and your health needs are our priority. As part of our continuing mission to provide you with exceptional heart care, we have created designated Provider Care Teams. These Care Teams include your primary Cardiologist (physician) and Advanced Practice Providers (APPs- Physician Assistants and Nurse Practitioners) who all work together to provide you with the care you need, when you need it.   You may see any of the following providers on your designated Care Team at your next follow up: Dr Glori Bickers Dr Haynes Kerns, NP Lyda Jester, PA Ginnie Smart  Marlyce Huge, [place] Audry Riles, PharmD   Please be sure to bring in all your medications bottles to every appointment.

## 2021-12-05 ENCOUNTER — Telehealth (HOSPITAL_COMMUNITY): Payer: Self-pay | Admitting: *Deleted

## 2021-12-07 DIAGNOSIS — J441 Chronic obstructive pulmonary disease with (acute) exacerbation: Secondary | ICD-10-CM | POA: Diagnosis not present

## 2021-12-13 ENCOUNTER — Telehealth (HOSPITAL_COMMUNITY): Payer: Self-pay | Admitting: *Deleted

## 2021-12-13 ENCOUNTER — Other Ambulatory Visit: Payer: Self-pay

## 2021-12-13 ENCOUNTER — Ambulatory Visit (HOSPITAL_COMMUNITY)
Admission: RE | Admit: 2021-12-13 | Discharge: 2021-12-13 | Disposition: A | Discharge status: Home / Self Care | Payer: Medicare HMO | Source: Ambulatory Visit | Attending: Cardiology | Admitting: Cardiology

## 2021-12-13 DIAGNOSIS — R0989 Other specified symptoms and signs involving the circulatory and respiratory systems: Secondary | ICD-10-CM

## 2021-12-13 DIAGNOSIS — I5032 Chronic diastolic (congestive) heart failure: Secondary | ICD-10-CM | POA: Insufficient documentation

## 2021-12-13 NOTE — Telephone Encounter (Signed)
Patient given detailed instructions per Myocardial Perfusion Study Information Sheet for the test on 12/17/21 at 10:45. Patient notified to arrive 15 minutes early and that it is imperative to arrive on time for appointment to keep from having the test rescheduled. ? If you need to cancel or reschedule your appointment, please call the office within 24 hours of your appointment. . Patient verbalized understanding.[name] ? ? ?

## 2021-12-17 ENCOUNTER — Other Ambulatory Visit: Payer: Self-pay

## 2021-12-17 ENCOUNTER — Ambulatory Visit (HOSPITAL_COMMUNITY): Payer: Medicare HMO | Attending: Internal Medicine

## 2021-12-17 DIAGNOSIS — I5032 Chronic diastolic (congestive) heart failure: Secondary | ICD-10-CM | POA: Diagnosis not present

## 2021-12-17 LAB — MYOCARDIAL PERFUSION IMAGING
LV dias vol: 70 mL (ref 62–150)
LV sys vol: 31 mL
Nuc Stress EF: 56 %
Peak HR: 96 {beats}/min
Rest HR: 80 {beats}/min
Rest Nuclear Isotope Dose: 9.6 mCi
SDS: 4
SRS: 0
SSS: 4
ST Depression (mm): 0 mm
Stress Nuclear Isotope Dose: 31.4 mCi
TID: 1.04

## 2021-12-17 MED ORDER — TECHNETIUM TC 99M TETROFOSMIN IV KIT
9.6000 | PACK | Freq: Once | INTRAVENOUS | Status: AC | PRN
  Administered 2021-12-17: 9.6 via INTRAVENOUS
  Filled 2021-12-17: qty 10

## 2021-12-17 MED ORDER — REGADENOSON 0.4 MG/5ML IV SOLN
0.4000 mg | Freq: Once | INTRAVENOUS | Status: AC
  Administered 2021-12-17: 0.4 mg via INTRAVENOUS

## 2021-12-17 MED ORDER — TECHNETIUM TC 99M TETROFOSMIN IV KIT
31.4000 | PACK | Freq: Once | INTRAVENOUS | Status: AC | PRN
  Administered 2021-12-17: 31.4 via INTRAVENOUS
  Filled 2021-12-17: qty 32

## 2021-12-27 ENCOUNTER — Encounter: Payer: Self-pay | Admitting: Emergency Medicine

## 2021-12-27 ENCOUNTER — Observation Stay (HOSPITAL_COMMUNITY): Payer: Medicare HMO

## 2021-12-27 ENCOUNTER — Ambulatory Visit
Admission: EM | Admit: 2021-12-27 | Discharge: 2021-12-27 | Disposition: A | Discharge status: Home / Self Care | Payer: Medicare HMO | Attending: Internal Medicine | Admitting: Internal Medicine

## 2021-12-27 ENCOUNTER — Encounter (HOSPITAL_COMMUNITY): Payer: Self-pay | Admitting: Emergency Medicine

## 2021-12-27 ENCOUNTER — Other Ambulatory Visit: Payer: Self-pay

## 2021-12-27 ENCOUNTER — Inpatient Hospital Stay (HOSPITAL_COMMUNITY)
Admission: EM | Admit: 2021-12-27 | Discharge: 2021-12-29 | DRG: 190 | Disposition: A | Discharge status: Home / Self Care | Payer: Medicare HMO | Attending: Family Medicine | Admitting: Family Medicine

## 2021-12-27 ENCOUNTER — Emergency Department (HOSPITAL_COMMUNITY): Payer: Medicare HMO

## 2021-12-27 DIAGNOSIS — I2510 Atherosclerotic heart disease of native coronary artery without angina pectoris: Secondary | ICD-10-CM | POA: Diagnosis present

## 2021-12-27 DIAGNOSIS — R410 Disorientation, unspecified: Secondary | ICD-10-CM | POA: Diagnosis not present

## 2021-12-27 DIAGNOSIS — T380X5A Adverse effect of glucocorticoids and synthetic analogues, initial encounter: Secondary | ICD-10-CM | POA: Diagnosis present

## 2021-12-27 DIAGNOSIS — Z833 Family history of diabetes mellitus: Secondary | ICD-10-CM

## 2021-12-27 DIAGNOSIS — E1151 Type 2 diabetes mellitus with diabetic peripheral angiopathy without gangrene: Secondary | ICD-10-CM | POA: Diagnosis present

## 2021-12-27 DIAGNOSIS — J9611 Chronic respiratory failure with hypoxia: Secondary | ICD-10-CM | POA: Diagnosis not present

## 2021-12-27 DIAGNOSIS — E669 Obesity, unspecified: Secondary | ICD-10-CM | POA: Diagnosis present

## 2021-12-27 DIAGNOSIS — I5032 Chronic diastolic (congestive) heart failure: Secondary | ICD-10-CM | POA: Diagnosis present

## 2021-12-27 DIAGNOSIS — I10 Essential (primary) hypertension: Secondary | ICD-10-CM | POA: Diagnosis present

## 2021-12-27 DIAGNOSIS — K219 Gastro-esophageal reflux disease without esophagitis: Secondary | ICD-10-CM | POA: Diagnosis present

## 2021-12-27 DIAGNOSIS — Z79890 Hormone replacement therapy (postmenopausal): Secondary | ICD-10-CM

## 2021-12-27 DIAGNOSIS — I110 Hypertensive heart disease with heart failure: Secondary | ICD-10-CM | POA: Diagnosis present

## 2021-12-27 DIAGNOSIS — R062 Wheezing: Secondary | ICD-10-CM | POA: Diagnosis not present

## 2021-12-27 DIAGNOSIS — J9621 Acute and chronic respiratory failure with hypoxia: Secondary | ICD-10-CM | POA: Diagnosis not present

## 2021-12-27 DIAGNOSIS — Z955 Presence of coronary angioplasty implant and graft: Secondary | ICD-10-CM | POA: Diagnosis not present

## 2021-12-27 DIAGNOSIS — Z20822 Contact with and (suspected) exposure to covid-19: Secondary | ICD-10-CM | POA: Diagnosis not present

## 2021-12-27 DIAGNOSIS — R4182 Altered mental status, unspecified: Secondary | ICD-10-CM | POA: Diagnosis not present

## 2021-12-27 DIAGNOSIS — R0902 Hypoxemia: Secondary | ICD-10-CM | POA: Diagnosis not present

## 2021-12-27 DIAGNOSIS — Z7982 Long term (current) use of aspirin: Secondary | ICD-10-CM

## 2021-12-27 DIAGNOSIS — J441 Chronic obstructive pulmonary disease with (acute) exacerbation: Secondary | ICD-10-CM | POA: Diagnosis not present

## 2021-12-27 DIAGNOSIS — Z6833 Body mass index (BMI) 33.0-33.9, adult: Secondary | ICD-10-CM | POA: Diagnosis not present

## 2021-12-27 DIAGNOSIS — Z9981 Dependence on supplemental oxygen: Secondary | ICD-10-CM

## 2021-12-27 DIAGNOSIS — F419 Anxiety disorder, unspecified: Secondary | ICD-10-CM | POA: Diagnosis present

## 2021-12-27 DIAGNOSIS — E7800 Pure hypercholesterolemia, unspecified: Secondary | ICD-10-CM | POA: Diagnosis present

## 2021-12-27 DIAGNOSIS — J9622 Acute and chronic respiratory failure with hypercapnia: Secondary | ICD-10-CM | POA: Diagnosis not present

## 2021-12-27 DIAGNOSIS — G4733 Obstructive sleep apnea (adult) (pediatric): Secondary | ICD-10-CM | POA: Diagnosis present

## 2021-12-27 DIAGNOSIS — E039 Hypothyroidism, unspecified: Secondary | ICD-10-CM | POA: Diagnosis present

## 2021-12-27 DIAGNOSIS — Z79899 Other long term (current) drug therapy: Secondary | ICD-10-CM

## 2021-12-27 DIAGNOSIS — Z87891 Personal history of nicotine dependence: Secondary | ICD-10-CM

## 2021-12-27 DIAGNOSIS — Z83438 Family history of other disorder of lipoprotein metabolism and other lipidemia: Secondary | ICD-10-CM

## 2021-12-27 DIAGNOSIS — G9341 Metabolic encephalopathy: Secondary | ICD-10-CM

## 2021-12-27 DIAGNOSIS — Z825 Family history of asthma and other chronic lower respiratory diseases: Secondary | ICD-10-CM

## 2021-12-27 DIAGNOSIS — D72829 Elevated white blood cell count, unspecified: Secondary | ICD-10-CM | POA: Diagnosis not present

## 2021-12-27 DIAGNOSIS — Z8249 Family history of ischemic heart disease and other diseases of the circulatory system: Secondary | ICD-10-CM

## 2021-12-27 DIAGNOSIS — R0602 Shortness of breath: Secondary | ICD-10-CM | POA: Diagnosis not present

## 2021-12-27 DIAGNOSIS — R441 Visual hallucinations: Secondary | ICD-10-CM | POA: Diagnosis present

## 2021-12-27 DIAGNOSIS — I517 Cardiomegaly: Secondary | ICD-10-CM | POA: Diagnosis not present

## 2021-12-27 DIAGNOSIS — R442 Other hallucinations: Secondary | ICD-10-CM | POA: Diagnosis not present

## 2021-12-27 DIAGNOSIS — E785 Hyperlipidemia, unspecified: Secondary | ICD-10-CM | POA: Diagnosis present

## 2021-12-27 LAB — URINALYSIS, ROUTINE W REFLEX MICROSCOPIC
Bilirubin Urine: NEGATIVE
Glucose, UA: NEGATIVE mg/dL
Hgb urine dipstick: NEGATIVE
Ketones, ur: NEGATIVE mg/dL
Leukocytes,Ua: NEGATIVE
Nitrite: NEGATIVE
Protein, ur: NEGATIVE mg/dL
Specific Gravity, Urine: 1.012 (ref 1.005–1.030)
pH: 6 (ref 5.0–8.0)

## 2021-12-27 LAB — COMPREHENSIVE METABOLIC PANEL
ALT: 24 U/L (ref 0–44)
AST: 41 U/L (ref 15–41)
Albumin: 4.1 g/dL (ref 3.5–5.0)
Alkaline Phosphatase: 40 U/L (ref 38–126)
Anion gap: 7 (ref 5–15)
BUN: 16 mg/dL (ref 8–23)
CO2: 33 mmol/L — ABNORMAL HIGH (ref 22–32)
Calcium: 9.5 mg/dL (ref 8.9–10.3)
Chloride: 100 mmol/L (ref 98–111)
Creatinine, Ser: 1.11 mg/dL (ref 0.61–1.24)
GFR, Estimated: >60 mL/min (ref >60)
Glucose, Bld: 105 mg/dL — ABNORMAL HIGH (ref 70–99)
Potassium: 3.9 mmol/L (ref 3.5–5.1)
Sodium: 140 mmol/L (ref 135–145)
Total Bilirubin: 0.6 mg/dL (ref 0.3–1.2)
Total Protein: 7.2 g/dL (ref 6.5–8.1)

## 2021-12-27 LAB — CBC WITH DIFFERENTIAL/PLATELET
Abs Immature Granulocytes: 0.02 10*3/uL (ref 0.00–0.07)
Basophils Absolute: 0.1 10*3/uL (ref 0.0–0.1)
Basophils Relative: 1 %
Eosinophils Absolute: 0.3 10*3/uL (ref 0.0–0.5)
Eosinophils Relative: 4 %
HCT: 47.4 % (ref 39.0–52.0)
Hemoglobin: 15.2 g/dL (ref 13.0–17.0)
Immature Granulocytes: 0 %
Lymphocytes Relative: 21 %
Lymphs Abs: 2 10*3/uL (ref 0.7–4.0)
MCH: 29.9 pg (ref 26.0–34.0)
MCHC: 32.1 g/dL (ref 30.0–36.0)
MCV: 93.1 fL (ref 80.0–100.0)
Monocytes Absolute: 0.9 10*3/uL (ref 0.1–1.0)
Monocytes Relative: 9 %
Neutro Abs: 6.1 10*3/uL (ref 1.7–7.7)
Neutrophils Relative %: 65 %
Platelets: 126 10*3/uL — ABNORMAL LOW (ref 150–400)
RBC: 5.09 MIL/uL (ref 4.22–5.81)
RDW: 13.2 % (ref 11.5–15.5)
WBC: 9.3 10*3/uL (ref 4.0–10.5)
nRBC: 0 % (ref 0.0–0.2)

## 2021-12-27 LAB — BLOOD GAS, VENOUS
Acid-Base Excess: 9.6 mmol/L — ABNORMAL HIGH (ref 0.0–2.0)
Bicarbonate: 38.8 mmol/L — ABNORMAL HIGH (ref 20.0–28.0)
O2 Saturation: 69.2 %
Patient temperature: 37
pCO2, Ven: 72 mmHg (ref 44–60)
pH, Ven: 7.34 (ref 7.25–7.43)
pO2, Ven: 37 mmHg (ref 32–45)

## 2021-12-27 LAB — CREATININE, SERUM
Creatinine, Ser: 1 mg/dL (ref 0.61–1.24)
GFR, Estimated: >60 mL/min (ref >60)

## 2021-12-27 LAB — RESP PANEL BY RT-PCR (FLU A&B, COVID) ARPGX2
Influenza A by PCR: NEGATIVE
Influenza B by PCR: NEGATIVE
SARS Coronavirus 2 by RT PCR: NEGATIVE

## 2021-12-27 LAB — TROPONIN I (HIGH SENSITIVITY)
Troponin I (High Sensitivity): 7 ng/L (ref <18)
Troponin I (High Sensitivity): 7 ng/L (ref <18)

## 2021-12-27 LAB — BRAIN NATRIURETIC PEPTIDE: B Natriuretic Peptide: 93.4 pg/mL (ref 0.0–100.0)

## 2021-12-27 LAB — MAGNESIUM: Magnesium: 1.8 mg/dL (ref 1.7–2.4)

## 2021-12-27 MED ORDER — ENOXAPARIN SODIUM 40 MG/0.4ML IJ SOSY
40.0000 mg | PREFILLED_SYRINGE | Freq: Every day | INTRAMUSCULAR | Status: DC
  Administered 2021-12-27 – 2021-12-29 (×3): 40 mg via SUBCUTANEOUS
  Filled 2021-12-27 (×3): qty 0.4

## 2021-12-27 MED ORDER — PREDNISONE 20 MG PO TABS
40.0000 mg | ORAL_TABLET | Freq: Every day | ORAL | Status: DC
  Administered 2021-12-28: 40 mg via ORAL
  Filled 2021-12-27: qty 2

## 2021-12-27 MED ORDER — ONDANSETRON HCL 4 MG PO TABS: 4.0000 mg | ORAL_TABLET | Freq: Four times a day (QID) | ORAL | Status: DC | PRN

## 2021-12-27 MED ORDER — GUAIFENESIN ER 600 MG PO TB12
600.0000 mg | ORAL_TABLET | Freq: Two times a day (BID) | ORAL | Status: DC
  Administered 2021-12-27 – 2021-12-28 (×2): 600 mg via ORAL
  Filled 2021-12-27 (×2): qty 1

## 2021-12-27 MED ORDER — IPRATROPIUM BROMIDE 0.02 % IN SOLN
0.5000 mg | Freq: Four times a day (QID) | RESPIRATORY_TRACT | Status: DC
  Administered 2021-12-27: 0.5 mg via RESPIRATORY_TRACT
  Filled 2021-12-27 (×3): qty 2.5

## 2021-12-27 MED ORDER — INSULIN ASPART 100 UNIT/ML IJ SOLN
0.0000 [IU] | Freq: Three times a day (TID) | INTRAMUSCULAR | Status: DC
  Administered 2021-12-28: 3 [IU] via SUBCUTANEOUS
  Administered 2021-12-28 (×2): 2 [IU] via SUBCUTANEOUS
  Administered 2021-12-29: 3 [IU] via SUBCUTANEOUS

## 2021-12-27 MED ORDER — ACETAMINOPHEN 650 MG RE SUPP: 650.0000 mg | Freq: Four times a day (QID) | RECTAL | Status: DC | PRN

## 2021-12-27 MED ORDER — MELATONIN 5 MG PO TABS
5.0000 mg | ORAL_TABLET | Freq: Once | ORAL | Status: AC
  Administered 2021-12-27: 5 mg via ORAL
  Filled 2021-12-27: qty 1

## 2021-12-27 MED ORDER — ALBUTEROL SULFATE (2.5 MG/3ML) 0.083% IN NEBU
2.5000 mg | INHALATION_SOLUTION | Freq: Once | RESPIRATORY_TRACT | Status: AC
  Administered 2021-12-27: 2.5 mg via RESPIRATORY_TRACT

## 2021-12-27 MED ORDER — IPRATROPIUM-ALBUTEROL 0.5-2.5 (3) MG/3ML IN SOLN
3.0000 mL | Freq: Once | RESPIRATORY_TRACT | Status: AC
  Administered 2021-12-27: 3 mL via RESPIRATORY_TRACT
  Filled 2021-12-27: qty 3

## 2021-12-27 MED ORDER — ALBUTEROL SULFATE (2.5 MG/3ML) 0.083% IN NEBU
2.5000 mg | INHALATION_SOLUTION | Freq: Four times a day (QID) | RESPIRATORY_TRACT | Status: DC
  Administered 2021-12-27: 2.5 mg via RESPIRATORY_TRACT
  Filled 2021-12-27 (×2): qty 3

## 2021-12-27 MED ORDER — ACETAMINOPHEN 325 MG PO TABS: 650.0000 mg | ORAL_TABLET | Freq: Four times a day (QID) | ORAL | Status: DC | PRN

## 2021-12-27 MED ORDER — ASPIRIN EC 81 MG PO TBEC
81.0000 mg | DELAYED_RELEASE_TABLET | Freq: Every day | ORAL | Status: DC
  Administered 2021-12-28 – 2021-12-29 (×2): 81 mg via ORAL
  Filled 2021-12-27 (×2): qty 1

## 2021-12-27 MED ORDER — METOPROLOL SUCCINATE ER 50 MG PO TB24
50.0000 mg | ORAL_TABLET | Freq: Every day | ORAL | Status: DC
  Administered 2021-12-28 – 2021-12-29 (×2): 50 mg via ORAL
  Filled 2021-12-27 (×2): qty 1

## 2021-12-27 MED ORDER — METHYLPREDNISOLONE SODIUM SUCC 40 MG IJ SOLR
40.0000 mg | Freq: Two times a day (BID) | INTRAMUSCULAR | Status: DC
  Filled 2021-12-27: qty 1

## 2021-12-27 MED ORDER — METHYLPREDNISOLONE SODIUM SUCC 125 MG IJ SOLR
125.0000 mg | Freq: Once | INTRAMUSCULAR | Status: AC
  Administered 2021-12-27: 125 mg via INTRAVENOUS
  Filled 2021-12-27: qty 2

## 2021-12-27 MED ORDER — EZETIMIBE 10 MG PO TABS
10.0000 mg | ORAL_TABLET | Freq: Every day | ORAL | Status: DC
Start: 2021-12-27 — End: 2021-12-29
  Administered 2021-12-27 – 2021-12-29 (×3): 10 mg via ORAL
  Filled 2021-12-27 (×3): qty 1

## 2021-12-27 MED ORDER — IPRATROPIUM-ALBUTEROL 0.5-2.5 (3) MG/3ML IN SOLN: 3.0000 mL | Freq: Four times a day (QID) | RESPIRATORY_TRACT | Status: DC

## 2021-12-27 MED ORDER — ROSUVASTATIN CALCIUM 20 MG PO TABS
20.0000 mg | ORAL_TABLET | Freq: Every day | ORAL | Status: DC
  Administered 2021-12-28 – 2021-12-29 (×2): 20 mg via ORAL
  Filled 2021-12-27 (×2): qty 1

## 2021-12-27 MED ORDER — LEVOTHYROXINE SODIUM 75 MCG PO TABS
150.0000 ug | ORAL_TABLET | Freq: Every day | ORAL | Status: DC
  Administered 2021-12-28 – 2021-12-29 (×2): 150 ug via ORAL
  Filled 2021-12-27 (×2): qty 2

## 2021-12-27 MED ORDER — ONDANSETRON HCL 4 MG/2ML IJ SOLN: 4.0000 mg | Freq: Four times a day (QID) | INTRAMUSCULAR | Status: DC | PRN

## 2021-12-27 NOTE — H&P (Signed)
?History and Physical  ? ? ?Patient: [name]:[id number] DOB: May 15, 1930 ?DOA: 12/27/2021 ?DOS: the patient was seen and examined on 12/27/2021 ?PCP: [name], MD  ?Patient coming from: Home ? ?Chief Complaint:  ?Chief Complaint  ?Patient presents with  ? Shortness of Breath  ? ?HPI: [name] is a 88 y.o. male with medical history significant of COPD, chronic diastolic HF, HTN, HLD, hypothyroidism, CAD, DM2. Presenting with shortness of breath. History is from daughter at bedside as patient is somewhat confused. She reports that his symptoms started 2 days ago. He had wheeze and increased cough. She reports that he is intermittently compliant on his regular inhalers, but she has noticed that he has used increased albuterol rescues during this time. He has not had any fevers. She reports that he has been confused since last night. He is having hallucinations of people who have died. He is talking about events from years ago as if they are happening now. When his symptoms did not improve this morning, she became concerned brought him to urgent care. He was evaluated there and recommended that he come to the ED for care. She denies any other aggravating or alleviating factors.  ?   ?Review of Systems: As mentioned in the history of present illness. All other systems reviewed and are negative. ?Past Medical History:  ?Diagnosis Date  ? Anxiety   ? Asthma   ? Chronic diastolic CHF (congestive heart failure) ([place])   ? diastolic   ? COPD (chronic obstructive pulmonary disease) ([place])   ? Coronary artery disease   ? s/p PCI of RCA 03/2009 at which time there was 70% ramus and 90% RCA, cath 01/2011 showed patent stents in RCA with moderate prox disesae, aneurysmal left circ and small 90% ramus s/p PCI, patent LAD EF 55%  ? Diabetes mellitus   ? Diverticulitis Oct. 2013  ? bleeding in the past and Effient for his CAD was stopped  ? DVT (deep venous thrombosis) ([place])   ? GERD (gastroesophageal reflux disease)    ? Hypercholesterolemia   ? Hypertension   ? Hypothyroidism   ? Obesity (BMI 30-39.9)   ? OSA (obstructive sleep apnea)   ? severe on CPAP  ? Peripheral vascular disease ([place]) 11/2006  ? s/p left stent  ? Shortness of breath   ? Sleep apnea   ? severe OSA awaiting CPAP titration  ? ?Past Surgical History:  ?Procedure Laterality Date  ? ABDOMINAL AORTAGRAM N/A 04/20/2012  ? Procedure: ABDOMINAL AORTAGRAM;  Surgeon: Angelia Mould, MD;  Location: [facility] CATH LAB;  Service: Cardiovascular;  Laterality: N/A;  ? ABDOMINAL AORTAGRAM N/A 12/29/2012  ? Procedure: ABDOMINAL AORTAGRAM;  Surgeon: Serafina Mitchell, MD;  Location: [facility] CATH LAB;  Service: Cardiovascular;  Laterality: N/A;  ? ABDOMINAL AORTOGRAM W/LOWER EXTREMITY Bilateral 03/16/2021  ? Procedure: ABDOMINAL AORTOGRAM W/LOWER EXTREMITY;  Surgeon: Angelia Mould, MD;  Location: [place] CV LAB;  Service: Cardiovascular;  Laterality: Bilateral;  ? BALLOON DILATION  12/26/2011  ? Procedure: BALLOON DILATION;  Surgeon: Garlan Fair, MD;  Location: [facility] ENDOSCOPY;  Service: Endoscopy;  Laterality: N/A;  ? COLONOSCOPY  08/14/2012  ? Procedure: COLONOSCOPY;  Surgeon: Arta Silence, MD;  Location: [facility] ENDOSCOPY;  Service: Endoscopy;  Laterality: N/A;  ? CORONARY STENT PLACEMENT  2010 and 2012  ? ESOPHAGOGASTRODUODENOSCOPY  12/26/2011  ? Procedure: ESOPHAGOGASTRODUODENOSCOPY (EGD);  Surgeon: Garlan Fair, MD;  Location: [facility] ENDOSCOPY;  Service: Endoscopy;  Laterality: N/A;  ? ESOPHAGOGASTRODUODENOSCOPY  08/13/2012  ?  Procedure: ESOPHAGOGASTRODUODENOSCOPY (EGD);  Surgeon: Arta Silence, MD;  Location: [facility] ENDOSCOPY;  Service: Endoscopy;  Laterality: Left;  ? HERNIA REPAIR  1992  ? evntral hernia repair  ? LOWER EXTREMITY ANGIOGRAM Bilateral 12/29/2012  ? Procedure: LOWER EXTREMITY ANGIOGRAM;  Surgeon: Serafina Mitchell, MD;  Location: [facility] CATH LAB;  Service: Cardiovascular;  Laterality: Bilateral;  bilat lower extrem angio  ? ?Social History:  reports that he quit  smoking about 58 years ago. His smoking use included cigarettes and cigars. He has a 7.50 pack-year smoking history. He has never used smokeless tobacco. He reports that he does not drink alcohol and does not use drugs. ? ?Allergies  ?Allergen Reactions  ? Clopidogrel Itching  ? ? ?Family History  ?Problem Relation Age of Onset  ? Diabetes Mother   ? Hyperlipidemia Mother   ? Hypertension Mother   ? Heart attack Mother   ? Heart disease Mother   ?     before age 24  ? Diabetes Brother   ? Heart disease Father   ?     before age 66  ? Breast cancer Sister   ? Diabetes Sister   ? Hyperlipidemia Sister   ? Hypertension Sister   ? Heart attack Sister   ? Hyperlipidemia Sister   ? Hypertension Sister   ? Heart disease Sister   ?     Heart Disease before age 49  ? Asthma Sister   ? Malignant hyperthermia Neg Hx   ? ? ?Prior to Admission medications   ?Medication Sig Start Date End Date Taking? Authorizing Provider  ?albuterol (PROVENTIL HFA;VENTOLIN HFA) 108 (90 BASE) MCG/ACT inhaler Inhale 2 puffs into the lungs every 6 (six) hours as needed for wheezing or shortness of breath.    Yes [provider]  ?albuterol (PROVENTIL) (2.5 MG/3ML) 0.083% nebulizer solution Take 3 mLs (2.5 mg total) by nebulization every 4 (four) hours as needed for wheezing or shortness of breath. 09/25/16  Yes Patrecia Pour, MD  ?aspirin EC 81 MG tablet Take 81 mg by mouth daily.   Yes [provider]  ?Budeson-Glycopyrrol-Formoterol (BREZTRI AEROSPHERE) 160-9-4.8 MCG/ACT AERO Inhale 2 puffs into the lungs in the morning and at bedtime.   Yes [provider]  ?ezetimibe (ZETIA) 10 MG tablet Take 1 tablet (10 mg total) by mouth daily. 07/23/21  Yes Turner, Eber Hong, MD  ?glimepiride (AMARYL) 2 MG tablet Take 2 mg by mouth every evening.    Yes [provider]  ?ipratropium-albuterol (DUONEB) 0.5-2.5 (3) MG/3ML SOLN Take 3 mLs by nebulization every 4 (four) hours as needed (asthma).   Yes [provider]   ?levothyroxine (SYNTHROID, LEVOTHROID) 150 MCG tablet Take 150 mcg by mouth every evening.  05/19/17  Yes [provider]  ?metoprolol succinate (TOPROL-XL) 50 MG 24 hr tablet Take 1 tablet (50 mg total) by mouth daily. Take with or immediately following a meal. 07/02/21  Yes Turner, Eber Hong, MD  ?nitroGLYCERIN (NITROSTAT) 0.4 MG SL tablet Place 1 tablet (0.4 mg total) under the tongue every 5 (five) minutes as needed for chest pain. 07/06/20  Yes Turner, Eber Hong, MD  ?rosuvastatin (CRESTOR) 20 MG tablet Take 1 tablet (20 mg total) by mouth daily. 07/02/21  Yes Turner, Eber Hong, MD  ?Simethicone (GAS-X PO) Take 1 tablet by mouth daily as needed (gas relief).   Yes [provider]  ?meloxicam (MOBIC) 15 MG tablet Take 15 mg by mouth daily as needed for pain. ?Patient not taking: Reported  on 12/27/2021 03/30/19   [provider]  ?OXYGEN Inhale 3 application into the lungs. 3 liters n/c    [provider]  ? ? ?Physical Exam: ?Vitals:  ? 12/27/21 1358 12/27/21 1415 12/27/21 1500 12/27/21 1630  ?BP:  (!) 157/96 (!) 169/89 (!) 165/97  ?Pulse:  73 88 78  ?Resp:  14 (!) 23 20  ?Temp:      ?TempSrc:      ?SpO2:  100% 97% 100%  ?Weight: 86 kg     ?Height: '5\' 3"'$  (1.6 m)     ? ?General: 88 y.o. male resting in bed in NAD ?Eyes: PERRL, normal sclera ?ENMT: Nares patent w/o discharge, orophaynx clear, dentition normal, ears w/o discharge/lesions/ulcers ?Neck: Supple, trachea midline ?Cardiovascular: RRR, +S1, S2, no m/g/r, equal pulses throughout ?Respiratory: diffuse exp wheeze, slightly increased WOB on 3L [place] (he uses 3L [place] at baseline) ?GI: BS+, NDNT, no masses noted, no organomegaly noted ?MSK: No c/c; BLE edema ?Neuro: A&O x 2 (name, place), no focal deficits ?Psyc: Appropriate interaction and affect, calm/cooperative ? ?Data Reviewed: ? ?CO2  33 ?Glucose 105 ?Plt  126 ? ?CXR: No active disease.  Poor inspiration.  Chronic lung markings. ? ?EKG: sinus, no st elevations; RBBB previously  seen ? ?Assessment and Plan: ?No notes have been filed under this hospital service. ?Service: Hospitalist ?COPD exacerbation ?Chronic hypoxic/hypercapneic respiratory failure ?    - place in obs, tele ?    - contin

## 2021-12-27 NOTE — ED Provider Notes (Signed)
Emergency Department Provider Note   I have reviewed the triage vital signs and the nursing notes.   HISTORY  Chief Complaint Shortness of Breath   HPI [name] is a 88 y.o. male with past medical history reviewed below including COPD on home O2, CHF, hypertension presents emergency department with shortness of breath and confusion.  Patient arrives from urgent care after initially being evaluated there.  He has apparently been using his MDI without relief.  Family note that he seemed more confused over the past couple of days.  They report him having visual hallucinations of seeing dead family members and repetitive questioning.  No falls or head injuries.  No fevers. Family with concern for UTI but no urine symptoms.    Past Medical History:  Diagnosis Date   Anxiety    Asthma    Chronic diastolic CHF (congestive heart failure) ([facility])    diastolic    COPD (chronic obstructive pulmonary disease) ([facility])    Coronary artery disease    s/p PCI of RCA 03/2009 at which time there was 70% ramus and 90% RCA, cath 01/2011 showed patent stents in RCA with moderate prox disesae, aneurysmal left circ and small 90% ramus s/p PCI, patent LAD EF 55%   Diabetes mellitus    Diverticulitis Oct. 2013   bleeding in the past and Effient for his CAD was stopped   DVT (deep venous thrombosis) ([facility])    GERD (gastroesophageal reflux disease)    Hypercholesterolemia    Hypertension    Hypothyroidism    Obesity (BMI 30-39.9)    OSA (obstructive sleep apnea)    severe on CPAP   Peripheral vascular disease ([facility]) 11/2006   s/p left stent   Shortness of breath    Sleep apnea    severe OSA awaiting CPAP titration    Review of Systems  Constitutional: No fever/chills Eyes: No visual changes. ENT: No sore throat. Cardiovascular: Denies chest pain. Respiratory: Positive shortness of breath and wheezing.  Gastrointestinal: No abdominal pain.  No nausea, no vomiting.  No diarrhea.  No  constipation. Genitourinary: Negative for dysuria. Musculoskeletal: Negative for back pain. Skin: Negative for rash. Neurological: Negative for headaches, focal weakness or numbness.   ____________________________________________   PHYSICAL EXAM:  VITAL SIGNS: ED Triage Vitals  Enc Vitals Group     BP 12/27/21 1357 (!) 172/81     Pulse Rate 12/27/21 1357 87     Resp 12/27/21 1357 (!) 22     Temp 12/27/21 1357 98.3 F (36.8 C)     Temp Source 12/27/21 1357 Oral     SpO2 12/27/21 1357 100 %     Weight 12/27/21 1358 189 lb 9.5 oz (86 kg)     Height 12/27/21 1358 5\' 3"  (1.6 m)   Constitutional: Alert to self but mild confusion with date and location (ED in [place]). Some mild increased WOB and audible wheezing.  Eyes: Conjunctivae are normal.  Head: Atraumatic. Nose: No congestion/rhinnorhea. Mouth/Throat: Mucous membranes are moist.  Neck: No stridor.   Cardiovascular: Normal rate, regular rhythm. Good peripheral circulation. Grossly normal heart sounds.   Respiratory: Slightly increased respiratory effort.  No retractions. Lungs tight with end expiratory wheezing bilaterally.  Gastrointestinal: Soft and nontender. No distention.  Musculoskeletal: No lower extremity tenderness nor edema. No gross deformities of extremities. Neurologic:  Normal speech and language. No gross focal neurologic deficits are appreciated.  Skin:  Skin is warm, dry and intact. No rash noted.  ____________________________________________  LABS (all labs ordered are listed, but only abnormal results are displayed)  Labs Reviewed  COMPREHENSIVE METABOLIC PANEL - Abnormal; Notable for the following components:      Result Value   CO2 33 (*)    Glucose, Bld 105 (*)    All other components within normal limits  CBC WITH DIFFERENTIAL/PLATELET - Abnormal; Notable for the following components:   Platelets 126 (*)    All other components within normal limits  BLOOD GAS, VENOUS - Abnormal;  Notable for the following components:   pCO2, Ven 72 (*)    Bicarbonate 38.8 (*)    Acid-Base Excess 9.6 (*)    All other components within normal limits  RESP PANEL BY RT-PCR (FLU A&B, COVID) ARPGX2  URINE CULTURE  BRAIN NATRIURETIC PEPTIDE  MAGNESIUM  URINALYSIS, ROUTINE W REFLEX MICROSCOPIC  TROPONIN I (HIGH SENSITIVITY)  TROPONIN I (HIGH SENSITIVITY)   ____________________________________________  EKG   EKG Interpretation  Date/Time:  Thursday December 27 2021 14:08:37 EDT Ventricular Rate:  81 PR Interval:  189 QRS Duration: 129 QT Interval:  403 QTC Calculation: 468 R Axis:   -59 Text Interpretation: Sinus rhythm RBBB and LAFB Confirmed by Alona Bene [phone]) on 12/27/2021 2:33:16 PM        ____________________________________________  RADIOLOGY  DG Chest 2 View  Result Date: 12/27/2021 CLINICAL DATA:  Shortness of breath, worsening over the last several days. EXAM: CHEST - 2 VIEW COMPARISON:  10/30/2021 FINDINGS: Cardiomegaly. Coronary artery stents in place. The patient has taken a poor inspiration. Allowing for that, there is some chronic markings at the bases but no evidence acute edema, infiltrate, collapse or effusion. IMPRESSION: No active disease.  Poor inspiration.  Chronic lung markings. Electronically Signed   By: Paulina Fusi M.D.   On: 12/27/2021 15:21    ____________________________________________   PROCEDURES  Procedure(s) performed:   Procedures  None ____________________________________________   INITIAL IMPRESSION / ASSESSMENT AND PLAN / ED COURSE  Pertinent labs & imaging results that were available during my care of the patient were reviewed by me and considered in my medical decision making (see chart for details).   This patient is Presenting for Evaluation of AMS, which does require a range of treatment options, and is a complaint that involves a high risk of morbidity and mortality.  The Differential Diagnoses includes but is not  exclusive to alcohol, illicit or prescription medications, intracranial pathology such as stroke, intracerebral hemorrhage, fever or infectious causes including sepsis, hypoxemia, uremia, trauma, endocrine related disorders such as diabetes, hypoglycemia, thyroid-related diseases, etc.   Critical Interventions-    Medications  methylPREDNISolone sodium succinate (SOLU-MEDROL) 125 mg/2 mL injection 125 mg (has no administration in time range)  ipratropium-albuterol (DUONEB) 0.5-2.5 (3) MG/3ML nebulizer solution 3 mL (3 mLs Nebulization Given 12/27/21 1435)    Reassessment after intervention: Improved air entry and air movement.    I did obtain Additional Historical Information from family.  I decided to review pertinent External Data, and in summary patient transferred here from UC. Patient given albuterol neb PTA. Borderline low O2 on 3L [place].    Clinical Laboratory Tests Ordered, included VBG with elevated CO2 in the 70s. COVID and Flu are negative. Troponin and BNP are WNL. No anemia. No UTI.   Radiologic Tests Ordered, included CXR. I independently interpreted the images and agree with radiology interpretation.   Cardiac Monitor Tracing which shows NSR.    Social Determinants of Health Risk patient with a history of smoking.   Consult  complete with Hospitalist  Discussed patient's case with [facility], Dr. Ronaldo Miyamoto to request admission. Patient and family (if present) updated with plan.   Medical Decision Making: Summary:  Presents emergency department evaluation of shortness of breath with wheezing, suspect COPD exacerbation clinically although does have some swelling in the legs as well.  Plan for chest x-ray along with viral panel testing, BNP, additional nebulizer here.  Patient with some mild increased work of breathing.  Urgent care note describes severe wheezing and tachypnea at that location.  Possibly slightly improved since albuterol given there. Will obtain head CT given AMS but  suspect this may be related to COPD exacerbation. No recent steroids. Will check VBG for CO2 retention.   Reevaluation with update and discussion with patient and family. Air movement appears improved on exam. Hold on BiPAP for now. Will continue nebs and steroid with presumed CO2 retention causing AMS related to COPD exacerbation. UA without infection. Defer neuro imaging for now.   Disposition: admit  ____________________________________________  FINAL CLINICAL IMPRESSION(S) / ED DIAGNOSES  Final diagnoses:  COPD exacerbation ([facility])  Disorientation    Note:  This document was prepared using Dragon voice recognition software and may include unintentional dictation errors.  Alona Bene, MD, [facility] Emergency Medicine    Jossette Zirbel, Arlyss Repress, MD 12/27/21 [phone]

## 2021-12-27 NOTE — ED Provider Notes (Signed)
?[name] ? ? ? ?CSN: [id number] ?Arrival date & time: 12/27/21  1138 ? ? ?  ? ?History   ?Chief Complaint ?Chief Complaint  ?Patient presents with  ? Shortness of Breath  ? ? ?HPI ?[name] is a 88 y.o. male.  ? ?Patient presents with shortness of breath and wheezing that has worsened over the past week.  Daughter at bedside reports that patient has been having issues with COPD over the past few months and has been on steroids, inhalers, antibiotics with intermittent improvement.  Daughter became concerned because he became confused last night and was talking to people who have been dead for years.  This made her concerned for UTI but there are no current urinary symptoms.  Patient does wear 3 L of oxygen at baseline.  They have also been using albuterol inhalers with no improvement in shortness of breath.  He has had associated cough but reports this has been present for months.  Denies any fevers or upper respiratory symptoms. ? ? ?Shortness of Breath ? ?Past Medical History:  ?Diagnosis Date  ? Anxiety   ? Asthma   ? Chronic diastolic CHF (congestive heart failure) ([place])   ? diastolic   ? COPD (chronic obstructive pulmonary disease) ([place])   ? Coronary artery disease   ? s/p PCI of RCA 03/2009 at which time there was 70% ramus and 90% RCA, cath 01/2011 showed patent stents in RCA with moderate prox disesae, aneurysmal left circ and small 90% ramus s/p PCI, patent LAD EF 55%  ? Diabetes mellitus   ? Diverticulitis Oct. 2013  ? bleeding in the past and Effient for his CAD was stopped  ? DVT (deep venous thrombosis) ([place])   ? GERD (gastroesophageal reflux disease)   ? Hypercholesterolemia   ? Hypertension   ? Hypothyroidism   ? Obesity (BMI 30-39.9)   ? OSA (obstructive sleep apnea)   ? severe on CPAP  ? Peripheral vascular disease ([place]) 11/2006  ? s/p left stent  ? Shortness of breath   ? Sleep apnea   ? severe OSA awaiting CPAP titration  ? ? ?Patient Active Problem List  ? Diagnosis Date Noted  ?  Chronic respiratory failure with hypoxia ([place]) 06/11/2021  ? Sepsis ([place]) 04/01/2019  ? Acute lower GI bleeding 09/03/2018  ? Diabetes mellitus type II, non insulin dependent ([place]) 09/03/2018  ? Hyperkalemia 09/03/2018  ? Hypertensive urgency   ? COPD (chronic obstructive pulmonary disease) ([place]) 11/14/2017  ? Influenza A 11/13/2017  ? Mild renal insufficiency   ? LLL pneumonia 05/22/2017  ? History of DVT of lower extremity 05/22/2017  ? COPD exacerbation ([place]) 09/22/2016  ? SIRS (systemic inflammatory response syndrome) ([place]) 09/22/2016  ? Dyspnea 11/16/2014  ? Hypoxia 11/16/2014  ? Aftercare following surgery of the circulatory system 08/03/2014  ? Chronic diastolic CHF (congestive heart failure) ([place]) 06/21/2014  ? Edema of extremities 06/21/2014  ? COPD GOLD III 12/27/2013  ? Coronary artery disease   ? OSA (obstructive sleep apnea)   ? Obesity (BMI 30-39.9)   ? Weakness of left leg 11/20/2012  ? Left thigh pain 11/20/2012  ? PVD (peripheral vascular disease) ([place]) 04/01/2012  ? WEIGHT GAIN, ABNORMAL 12/07/2009  ? Dyslipidemia 11/21/2009  ? HYPERTENSION, BENIGN 11/21/2009  ? DEEP VENOUS THROMBOPHLEBITIS, LEFT, LEG, HX OF 11/21/2009  ? ? ?Past Surgical History:  ?Procedure Laterality Date  ? ABDOMINAL AORTAGRAM N/A 04/20/2012  ? Procedure: ABDOMINAL AORTAGRAM;  Surgeon: Angelia Mould, MD;  Location:  [facility] CATH LAB;  Service: Cardiovascular;  Laterality: N/A;  ? ABDOMINAL AORTAGRAM N/A 12/29/2012  ? Procedure: ABDOMINAL AORTAGRAM;  Surgeon: Serafina Mitchell, MD;  Location: [facility] CATH LAB;  Service: Cardiovascular;  Laterality: N/A;  ? ABDOMINAL AORTOGRAM W/LOWER EXTREMITY Bilateral 03/16/2021  ? Procedure: ABDOMINAL AORTOGRAM W/LOWER EXTREMITY;  Surgeon: Angelia Mould, MD;  Location: [facility] CV LAB;  Service: Cardiovascular;  Laterality: Bilateral;  ? BALLOON DILATION  12/26/2011  ? Procedure: BALLOON DILATION;  Surgeon: Garlan Fair, MD;  Location: [facility];  Service: Endoscopy;  Laterality: N/A;   ? COLONOSCOPY  08/14/2012  ? Procedure: COLONOSCOPY;  Surgeon: Arta Silence, MD;  Location: [facility] ENDOSCOPY;  Service: Endoscopy;  Laterality: N/A;  ? CORONARY STENT PLACEMENT  2010 and 2012  ? ESOPHAGOGASTRODUODENOSCOPY  12/26/2011  ? Procedure: ESOPHAGOGASTRODUODENOSCOPY (EGD);  Surgeon: Garlan Fair, MD;  Location: [facility]OS[facility];  Service: Endoscopy;  Laterality: N/A;  ? ESOPHAGOGASTRODUODENOSCOPY  08/13/2012  ? Procedure: ESOPHAGOGASTRODUODENOSCOPY (EGD);  Surgeon: Arta Silence, MD;  Location: [facility];  Service: Endoscopy;  Laterality: Left;  ? HERNIA REPAIR  1992  ? evntral hernia repair  ? LOWER EXTREMITY ANGIOGRAM Bilateral 12/29/2012  ? Procedure: LOWER EXTREMITY ANGIOGRAM;  Surgeon: Serafina Mitchell, MD;  Location: [facility] CATH LAB;  Service: Cardiovascular;  Laterality: Bilateral;  bilat lower extrem angio  ? ? ? ? ? ?Home Medications   ? ?Prior to Admission medications   ?Medication Sig Start Date End Date Taking? Authorizing Provider  ?albuterol (PROVENTIL HFA;VENTOLIN HFA) 108 (90 BASE) MCG/ACT inhaler Inhale 2 puffs into the lungs every 6 (six) hours as needed for wheezing or shortness of breath.     [provider]  ?albuterol (PROVENTIL) (2.5 MG/3ML) 0.083% nebulizer solution Take 3 mLs (2.5 mg total) by nebulization every 4 (four) hours as needed for wheezing or shortness of breath. 09/25/16   Patrecia Pour, MD  ?aspirin EC 81 MG tablet Take 81 mg by mouth daily.    [provider]  ?Budeson-Glycopyrrol-Formoterol (BREZTRI AEROSPHERE) 160-9-4.8 MCG/ACT AERO Inhale 2 puffs into the lungs in the morning and at bedtime.    [provider]  ?ezetimibe (ZETIA) 10 MG tablet Take 1 tablet (10 mg total) by mouth daily. 07/23/21   Sueanne Margarita, MD  ?glimepiride (AMARYL) 2 MG tablet Take 2 mg by mouth every evening.     [provider]  ?ipratropium-albuterol (DUONEB) 0.5-2.5 (3) MG/3ML SOLN Take 3 mLs by nebulization every 4 (four) hours as needed (asthma).     [provider]  ?levothyroxine (SYNTHROID, LEVOTHROID) 150 MCG tablet Take 150 mcg by mouth every evening.  05/19/17   [provider]  ?meloxicam (MOBIC) 15 MG tablet Take 15 mg by mouth daily as needed for pain. 03/30/19   [provider]  ?metoprolol succinate (TOPROL-XL) 50 MG 24 hr tablet Take 1 tablet (50 mg total) by mouth daily. Take with or immediately following a meal. 07/02/21   Turner, Eber Hong, MD  ?nitroGLYCERIN (NITROSTAT) 0.4 MG SL tablet Place 1 tablet (0.4 mg total) under the tongue every 5 (five) minutes as needed for chest pain. 07/06/20   Sueanne Margarita, MD  ?OXYGEN Inhale 3 application into the lungs. 3 liters n/c    [provider]  ?rosuvastatin (CRESTOR) 20 MG tablet Take 1 tablet (20 mg total) by mouth daily. 07/02/21   Sueanne Margarita, MD  ? ? ?Family History ?Family History  ?Problem Relation Age of Onset  ? Diabetes Mother   ?  Hyperlipidemia Mother   ? Hypertension Mother   ? Heart attack Mother   ? Heart disease Mother   ?     before age 55  ? Diabetes Brother   ? Heart disease Father   ?     before age 27  ? Breast cancer Sister   ? Diabetes Sister   ? Hyperlipidemia Sister   ? Hypertension Sister   ? Heart attack Sister   ? Hyperlipidemia Sister   ? Hypertension Sister   ? Heart disease Sister   ?     Heart Disease before age 59  ? Asthma Sister   ? Malignant hyperthermia Neg Hx   ? ? ?Social History ?Social History  ? ?Tobacco Use  ? Smoking status: Former  ?  Packs/day: 0.50  ?  Years: 15.00  ?  Pack years: 7.50  ?  Types: Cigarettes, Cigars  ?  Quit date: 10/15/1963  ?  Years since quitting: 58.2  ? Smokeless tobacco: Never  ?Vaping Use  ? Vaping Use: Never used  ?Substance Use Topics  ? Alcohol use: No  ? Drug use: No  ? ? ? ?Allergies   ?Clopidogrel ? ? ?Review of Systems ?Review of Systems ?Per HPI ? ?Physical Exam ?Triage Vital Signs ?ED Triage Vitals  ?Enc Vitals Group  ?   BP 12/27/21 1225 (!) 155/79  ?   Pulse Rate 12/27/21 1225 91  ?   Resp  12/27/21 1225 (!) 22  ?   Temp 12/27/21 1225 97.7 ?F (36.5 ?C)  ?   Temp Source 12/27/21 1225 Oral  ?   SpO2 12/27/21 1225 93 %  ?   Weight --   ?   Height --   ?   Head Circumference --   ?   Peak Flow --   ?

## 2021-12-27 NOTE — ED Notes (Signed)
Pt refuses CT at this time.  States he can not lay flat or he will "stop breathing". ?

## 2021-12-27 NOTE — ED Triage Notes (Signed)
Reports sob and wheezing with activity.  Wheezing noticed for 2 days.   ? ?Last night developed concerns for uti.  Talking to people that have been passed away for years.  Patient unable to sleep.  Patient is pulling oxygen off.  Last night is when patient started behaving differently ? ?93% on 3 liters ?

## 2021-12-27 NOTE — Discharge Instructions (Signed)
Patient sent to the hospital via EMS. 

## 2021-12-27 NOTE — ED Notes (Signed)
Family with patient in room ?

## 2021-12-27 NOTE — ED Triage Notes (Signed)
PT to ER via EMS form urgent care.   ?PT reports with increasing SHOB over last several days.  Using albuterol MDI with no change.  Family was concerned as pt was seeing dead relatives last night.  PT is alert and oriented, but does repeat questions that have been answered. ?

## 2021-12-27 NOTE — ED Notes (Signed)
Called [facility] for transport ?

## 2021-12-28 ENCOUNTER — Inpatient Hospital Stay (HOSPITAL_COMMUNITY): Payer: Medicare HMO

## 2021-12-28 DIAGNOSIS — Z79890 Hormone replacement therapy (postmenopausal): Secondary | ICD-10-CM | POA: Diagnosis not present

## 2021-12-28 DIAGNOSIS — E7800 Pure hypercholesterolemia, unspecified: Secondary | ICD-10-CM | POA: Diagnosis present

## 2021-12-28 DIAGNOSIS — R441 Visual hallucinations: Secondary | ICD-10-CM | POA: Diagnosis present

## 2021-12-28 DIAGNOSIS — I10 Essential (primary) hypertension: Secondary | ICD-10-CM | POA: Diagnosis not present

## 2021-12-28 DIAGNOSIS — Z7982 Long term (current) use of aspirin: Secondary | ICD-10-CM | POA: Diagnosis not present

## 2021-12-28 DIAGNOSIS — Z6833 Body mass index (BMI) 33.0-33.9, adult: Secondary | ICD-10-CM | POA: Diagnosis not present

## 2021-12-28 DIAGNOSIS — G4733 Obstructive sleep apnea (adult) (pediatric): Secondary | ICD-10-CM | POA: Diagnosis present

## 2021-12-28 DIAGNOSIS — I110 Hypertensive heart disease with heart failure: Secondary | ICD-10-CM | POA: Diagnosis present

## 2021-12-28 DIAGNOSIS — E039 Hypothyroidism, unspecified: Secondary | ICD-10-CM | POA: Diagnosis present

## 2021-12-28 DIAGNOSIS — R410 Disorientation, unspecified: Secondary | ICD-10-CM | POA: Diagnosis present

## 2021-12-28 DIAGNOSIS — I5032 Chronic diastolic (congestive) heart failure: Secondary | ICD-10-CM | POA: Diagnosis present

## 2021-12-28 DIAGNOSIS — D72829 Elevated white blood cell count, unspecified: Secondary | ICD-10-CM | POA: Diagnosis present

## 2021-12-28 DIAGNOSIS — Z955 Presence of coronary angioplasty implant and graft: Secondary | ICD-10-CM | POA: Diagnosis not present

## 2021-12-28 DIAGNOSIS — Z9981 Dependence on supplemental oxygen: Secondary | ICD-10-CM | POA: Diagnosis not present

## 2021-12-28 DIAGNOSIS — T380X5A Adverse effect of glucocorticoids and synthetic analogues, initial encounter: Secondary | ICD-10-CM | POA: Diagnosis present

## 2021-12-28 DIAGNOSIS — J9621 Acute and chronic respiratory failure with hypoxia: Secondary | ICD-10-CM | POA: Diagnosis present

## 2021-12-28 DIAGNOSIS — G9341 Metabolic encephalopathy: Secondary | ICD-10-CM

## 2021-12-28 DIAGNOSIS — J9611 Chronic respiratory failure with hypoxia: Secondary | ICD-10-CM | POA: Diagnosis not present

## 2021-12-28 DIAGNOSIS — E669 Obesity, unspecified: Secondary | ICD-10-CM | POA: Diagnosis present

## 2021-12-28 DIAGNOSIS — I2510 Atherosclerotic heart disease of native coronary artery without angina pectoris: Secondary | ICD-10-CM | POA: Diagnosis present

## 2021-12-28 DIAGNOSIS — K219 Gastro-esophageal reflux disease without esophagitis: Secondary | ICD-10-CM | POA: Diagnosis present

## 2021-12-28 DIAGNOSIS — Z20822 Contact with and (suspected) exposure to covid-19: Secondary | ICD-10-CM | POA: Diagnosis present

## 2021-12-28 DIAGNOSIS — E1151 Type 2 diabetes mellitus with diabetic peripheral angiopathy without gangrene: Secondary | ICD-10-CM | POA: Diagnosis present

## 2021-12-28 DIAGNOSIS — J441 Chronic obstructive pulmonary disease with (acute) exacerbation: Secondary | ICD-10-CM | POA: Diagnosis present

## 2021-12-28 DIAGNOSIS — F419 Anxiety disorder, unspecified: Secondary | ICD-10-CM | POA: Diagnosis present

## 2021-12-28 DIAGNOSIS — Z79899 Other long term (current) drug therapy: Secondary | ICD-10-CM | POA: Diagnosis not present

## 2021-12-28 DIAGNOSIS — J9622 Acute and chronic respiratory failure with hypercapnia: Secondary | ICD-10-CM | POA: Diagnosis present

## 2021-12-28 LAB — CBC
HCT: 46.3 % (ref 39.0–52.0)
Hemoglobin: 14.9 g/dL (ref 13.0–17.0)
MCH: 29.9 pg (ref 26.0–34.0)
MCHC: 32.2 g/dL (ref 30.0–36.0)
MCV: 93 fL (ref 80.0–100.0)
Platelets: 136 10*3/uL — ABNORMAL LOW (ref 150–400)
RBC: 4.98 MIL/uL (ref 4.22–5.81)
RDW: 13.2 % (ref 11.5–15.5)
WBC: 7.3 10*3/uL (ref 4.0–10.5)
nRBC: 0 % (ref 0.0–0.2)

## 2021-12-28 LAB — COMPREHENSIVE METABOLIC PANEL
ALT: 22 U/L (ref 0–44)
AST: 34 U/L (ref 15–41)
Albumin: 4 g/dL (ref 3.5–5.0)
Alkaline Phosphatase: 36 U/L — ABNORMAL LOW (ref 38–126)
Anion gap: 10 (ref 5–15)
BUN: 20 mg/dL (ref 8–23)
CO2: 28 mmol/L (ref 22–32)
Calcium: 9.6 mg/dL (ref 8.9–10.3)
Chloride: 100 mmol/L (ref 98–111)
Creatinine, Ser: 0.97 mg/dL (ref 0.61–1.24)
GFR, Estimated: >60 mL/min (ref >60)
Glucose, Bld: 177 mg/dL — ABNORMAL HIGH (ref 70–99)
Potassium: 4.4 mmol/L (ref 3.5–5.1)
Sodium: 138 mmol/L (ref 135–145)
Total Bilirubin: 0.8 mg/dL (ref 0.3–1.2)
Total Protein: 7.2 g/dL (ref 6.5–8.1)

## 2021-12-28 LAB — GLUCOSE, CAPILLARY
Glucose-Capillary: 157 mg/dL — ABNORMAL HIGH (ref 70–99)
Glucose-Capillary: 123 mg/dL — ABNORMAL HIGH (ref 70–99)
Glucose-Capillary: 146 mg/dL — ABNORMAL HIGH (ref 70–99)

## 2021-12-28 MED ORDER — LEVALBUTEROL HCL 0.63 MG/3ML IN NEBU
0.6300 mg | INHALATION_SOLUTION | Freq: Four times a day (QID) | RESPIRATORY_TRACT | Status: DC
  Administered 2021-12-28 – 2021-12-29 (×4): 0.63 mg via RESPIRATORY_TRACT
  Filled 2021-12-28 (×4): qty 3

## 2021-12-28 MED ORDER — METHYLPREDNISOLONE SODIUM SUCC 40 MG IJ SOLR
40.0000 mg | Freq: Two times a day (BID) | INTRAMUSCULAR | Status: DC
  Administered 2021-12-28 – 2021-12-29 (×3): 40 mg via INTRAVENOUS
  Filled 2021-12-28 (×3): qty 1

## 2021-12-28 MED ORDER — LORAZEPAM 2 MG/ML IJ SOLN
1.0000 mg | Freq: Once | INTRAMUSCULAR | Status: AC
  Administered 2021-12-28: 1 mg via INTRAVENOUS
  Filled 2021-12-28: qty 1

## 2021-12-28 MED ORDER — IPRATROPIUM BROMIDE 0.02 % IN SOLN: 0.5000 mg | Freq: Four times a day (QID) | RESPIRATORY_TRACT | Status: DC

## 2021-12-28 MED ORDER — IPRATROPIUM-ALBUTEROL 0.5-2.5 (3) MG/3ML IN SOLN
3.0000 mL | Freq: Four times a day (QID) | RESPIRATORY_TRACT | Status: DC
  Administered 2021-12-28: 3 mL via RESPIRATORY_TRACT

## 2021-12-28 MED ORDER — GUAIFENESIN ER 600 MG PO TB12
1200.0000 mg | ORAL_TABLET | Freq: Two times a day (BID) | ORAL | Status: DC
Start: 2021-12-28 — End: 2021-12-29
  Administered 2021-12-28 – 2021-12-29 (×2): 1200 mg via ORAL
  Filled 2021-12-28 (×2): qty 2

## 2021-12-28 MED ORDER — IPRATROPIUM BROMIDE 0.02 % IN SOLN
0.5000 mg | Freq: Four times a day (QID) | RESPIRATORY_TRACT | Status: DC
  Administered 2021-12-28 – 2021-12-29 (×4): 0.5 mg via RESPIRATORY_TRACT
  Filled 2021-12-28 (×4): qty 2.5

## 2021-12-28 MED ORDER — LEVALBUTEROL HCL 0.63 MG/3ML IN NEBU: 0.6300 mg | INHALATION_SOLUTION | Freq: Four times a day (QID) | RESPIRATORY_TRACT | Status: DC

## 2021-12-28 NOTE — Progress Notes (Addendum)
attempted to do an IV on pt. He became verbally combative in addition to existing confusion. Labored breathing with expiratory wheezes. When attempts were made to place O2 back on pt stated that "Jesus is his breath and he will be alright" and that O2 [place] is not needed. ? ?Pt still does not have an IV. only med due is IV solumedrol.  ? ? ?Will not be able to complete CT scan on this shift as pt has not be consistently calm to complete medical tasks.  ? ? ?Pt initially refused to wear O2 [place], sats were at 88-89% on Room Air. ? ?Medical staff will step out of room so that pt may calm down. ? ?Pt daughter is at bedside. Pt left sitting on side of bed, talking to daughter.  ? ?About 2 hours later pt was up with assistance from daughter to restroom, upon returning to bed pt agreed to wear O2 [place] only. ? ?No IV or CT scan was completed as pt refused.  ? ? ?On call APP Olena Heckle, NP was notified of events throughout the night ?

## 2021-12-28 NOTE — Progress Notes (Signed)
pt is confused, very impulsive. pt daughter is at bedside stated that patient has not slept in about 2days now and is asking if we could give him something to help him settle down and sleep. ? ?pt removed all medical equipment including IV and is not allowing me to place another at this time. ?Pt did agree to wear O2  at this time ?Ct scan was also refused.  ?

## 2021-12-28 NOTE — Progress Notes (Signed)
[facility] ? ?PROGRESS NOTE ? ?VEGA WITHROW QQI:[id number] DOB: 1929/11/16 DOA: 12/27/2021 ?PCP: [name], MD ? ? ?Brief HPI:   ?88 year old male with history of COPD, chronic diastolic CHF, hypertension, hyperlipidemia, hypothyroidism, CAD, diabetes mellitus type 2 presented with shortness of breath.  Patient admitted with COPD exacerbation.  Patient is on home O2 at 3 L/min ? ? ? ?Subjective  ? ?Patient seen and examined, has been anxious since he has been getting albuterol as per daughter. ? ? Assessment/Plan:  ? ?Acute on chronic hypoxemic/hypercapnic respiratory failure ?-Secondary to COPD exacerbation ?-Patient is on 3 L/min of oxygen ?-He still wheezing ?-We will continue with Solu-Medrol 40 mg IV every 12 hours ?-Change albuterol to Xopenex nebulizer every 6 hours scheduled ?-Start Atrovent nebulizer every 6 hours ?-Start Mucinex 1200 mg p.o. twice daily ?-Flutter valve every 4 hours ? ? ?Acute metabolic encephalopathy ?-Patient presented with acute metabolic encephalopathy ?-CT head was ordered ?-UA is clear ?-We will follow CT head results ? ?Diabetes mellitus type 2 ?-Continue sliding scale insulin with NovoLog ?-CBG well controlled ? ?Chronic diastolic CHF ?-Euvolemic ? ?Hypothyroidism ?-Continue Synthroid ? ?Hyperlipidemia ?-Continue rosuvastatin, Zetia ? ?Hypertension ?-Continue metoprolol succinate ? ?Obstructive sleep apnea ?-Patient is CPAP intolerant as per pulmonology notes ? ? ? ?Medications ? ?  ? aspirin EC  81 mg Oral Daily  ? enoxaparin (LOVENOX) injection  40 mg Subcutaneous Daily  ? ezetimibe  10 mg Oral Daily  ? guaiFENesin  1,200 mg Oral BID  ? insulin aspart  0-15 Units Subcutaneous TID WC  ? ipratropium  0.5 mg Nebulization Q6H  ? levalbuterol  0.63 mg Nebulization Q6H  ? levothyroxine  150 mcg Oral Q0600  ? LORazepam  1 mg Intravenous Once  ? metoprolol succinate  50 mg Oral Daily  ? predniSONE  40 mg Oral Q breakfast  ? rosuvastatin  20 mg Oral Daily  ? ? ? Data  Reviewed:  ? ?CBG: ? ?Recent Labs  ?Lab 12/28/21 ?0735 12/28/21 ?1207  ?GLUCAP 157* 123*  ? ? ?SpO2: 94 % ?O2 Flow Rate (L/min): 3 L/min  ? ? ?Vitals:  ? 12/27/21 1952 12/28/21 0752 12/28/21 0820 12/28/21 1212  ?BP: (!) 177/88  131/79 97/84  ?Pulse: 92  88 76  ?Resp: 18   19  ?Temp: 98.2 ?F (36.8 ?C)   98.8 ?F (37.1 ?C)  ?TempSrc: Oral   Oral  ?SpO2: 96% 94%    ?Weight:      ?Height:      ? ? ? ? ?Data Reviewed: ? ?Basic Metabolic Panel: ?Recent Labs  ?Lab 12/27/21 ?1426 12/27/21 ?1850 12/28/21 ?0434  ?NA 140  --  138  ?K 3.9  --  4.4  ?CL 100  --  100  ?CO2 33*  --  28  ?GLUCOSE 105*  --  177*  ?BUN 16  --  20  ?CREATININE 1.11 1.00 0.97  ?CALCIUM 9.5  --  9.6  ?MG 1.8  --   --   ? ? ?CBC: ?Recent Labs  ?Lab 12/27/21 ?1426 12/28/21 ?0434  ?WBC 9.3 7.3  ?NEUTROABS 6.1  --   ?HGB 15.2 14.9  ?HCT 47.4 46.3  ?MCV 93.1 93.0  ?PLT 126* 136*  ? ? ?LFT ?Recent Labs  ?Lab 12/27/21 ?1426 12/28/21 ?0434  ?AST 41 34  ?ALT 24 22  ?ALKPHOS 40 36*  ?BILITOT 0.6 0.8  ?PROT 7.2 7.2  ?ALBUMIN 4.1 4.0  ? ?  ?Antibiotics: ?Anti-infectives (From admission, onward)  ? ? None  ? ?  ? ? ? ?  DVT prophylaxis: Lovenox ? ?Code Status: Full code ? ?Family Communication: No family at bedside ? ? ?CONSULTS  ? ? ?Objective  ? ? ?Physical Examination: ? ?General-appears in no acute distress ?Heart-S1-S2, regular, no murmur auscultated ?Lungs-bilateral wheezing auscultated ?Abdomen-soft, nontender, no organomegaly ?Extremities-no edema in the lower extremities ?Neuro-alert, oriented x3, no focal deficit noted ? ? ?Status is: Inpatient: COPD exacerbation ? ? ? ?  ? ? ? ?Oswald Hillock ?  ?[facility] ?If 7PM-7AM, please contact night-coverage at [email or web address], ?Office  [phone] ? ? ?12/28/2021, 12:15 PM  LOS: 0 days  ? ? ? ? ? ? ? ? ? ? ?  ?

## 2021-12-28 NOTE — Plan of Care (Signed)
?  Problem: Activity: ?Goal: Ability to tolerate increased activity will improve ?Outcome: Progressing ?  ?Problem: Respiratory: ?Goal: Ability to maintain a clear airway will improve ?Outcome: Progressing ?Goal: Ability to maintain adequate ventilation will improve ?Outcome: Progressing ?  ?

## 2021-12-29 DIAGNOSIS — J9611 Chronic respiratory failure with hypoxia: Secondary | ICD-10-CM

## 2021-12-29 DIAGNOSIS — I5032 Chronic diastolic (congestive) heart failure: Secondary | ICD-10-CM | POA: Diagnosis not present

## 2021-12-29 DIAGNOSIS — G4733 Obstructive sleep apnea (adult) (pediatric): Secondary | ICD-10-CM

## 2021-12-29 DIAGNOSIS — J441 Chronic obstructive pulmonary disease with (acute) exacerbation: Secondary | ICD-10-CM | POA: Diagnosis not present

## 2021-12-29 DIAGNOSIS — G9341 Metabolic encephalopathy: Secondary | ICD-10-CM | POA: Diagnosis not present

## 2021-12-29 LAB — BASIC METABOLIC PANEL
Anion gap: 8 (ref 5–15)
BUN: 33 mg/dL — ABNORMAL HIGH (ref 8–23)
CO2: 31 mmol/L (ref 22–32)
Calcium: 9.5 mg/dL (ref 8.9–10.3)
Chloride: 99 mmol/L (ref 98–111)
Creatinine, Ser: 1.07 mg/dL (ref 0.61–1.24)
GFR, Estimated: >60 mL/min (ref >60)
Glucose, Bld: 153 mg/dL — ABNORMAL HIGH (ref 70–99)
Potassium: 4.3 mmol/L (ref 3.5–5.1)
Sodium: 138 mmol/L (ref 135–145)

## 2021-12-29 LAB — URINE CULTURE: Culture: NO GROWTH

## 2021-12-29 LAB — HEMOGLOBIN A1C
Hgb A1c MFr Bld: 6.7 % — ABNORMAL HIGH (ref 4.8–5.6)
Mean Plasma Glucose: 146 mg/dL

## 2021-12-29 LAB — GLUCOSE, CAPILLARY: Glucose-Capillary: 164 mg/dL — ABNORMAL HIGH (ref 70–99)

## 2021-12-29 LAB — CBC
HCT: 44.4 % (ref 39.0–52.0)
Hemoglobin: 14.1 g/dL (ref 13.0–17.0)
MCH: 29.6 pg (ref 26.0–34.0)
MCHC: 31.8 g/dL (ref 30.0–36.0)
MCV: 93.1 fL (ref 80.0–100.0)
Platelets: 137 10*3/uL — ABNORMAL LOW (ref 150–400)
RBC: 4.77 MIL/uL (ref 4.22–5.81)
RDW: 13.2 % (ref 11.5–15.5)
WBC: 15.1 10*3/uL — ABNORMAL HIGH (ref 4.0–10.5)
nRBC: 0 % (ref 0.0–0.2)

## 2021-12-29 MED ORDER — PREDNISONE 10 MG PO TABS: ORAL_TABLET | ORAL | 0 refills | Status: DC

## 2021-12-29 MED ORDER — LEVALBUTEROL HCL 0.63 MG/3ML IN NEBU
0.6300 mg | INHALATION_SOLUTION | Freq: Four times a day (QID) | RESPIRATORY_TRACT | 3 refills | Status: AC | PRN
Start: 2021-12-29 — End: ?

## 2021-12-29 MED ORDER — GUAIFENESIN ER 600 MG PO TB12: 600.0000 mg | ORAL_TABLET | Freq: Two times a day (BID) | ORAL | 0 refills | Status: DC

## 2021-12-29 MED ORDER — IPRATROPIUM BROMIDE 0.02 % IN SOLN
0.5000 mg | Freq: Four times a day (QID) | RESPIRATORY_TRACT | 3 refills | Status: AC | PRN
Start: 2021-12-29 — End: ?

## 2021-12-29 NOTE — Plan of Care (Signed)
?  Problem: Education: ?Goal: Knowledge of disease or condition will improve ?Outcome: Adequate for Discharge ?Goal: Knowledge of the prescribed therapeutic regimen will improve ?12/29/2021 1144 by Lennie Hummer, RN ?Outcome: Adequate for Discharge ?12/29/2021 1106 by Lennie Hummer, RN ?Outcome: Progressing ?Goal: Individualized Educational Video(s) ?Outcome: Adequate for Discharge ?  ?Problem: Activity: ?Goal: Ability to tolerate increased activity will improve ?12/29/2021 1144 by Lennie Hummer, RN ?Outcome: Adequate for Discharge ?12/29/2021 1106 by Lennie Hummer, RN ?Outcome: Progressing ?Goal: Will verbalize the importance of balancing activity with adequate rest periods ?Outcome: Adequate for Discharge ?  ?Problem: Respiratory: ?Goal: Ability to maintain a clear airway will improve ?12/29/2021 1144 by Lennie Hummer, RN ?Outcome: Adequate for Discharge ?12/29/2021 1106 by Lennie Hummer, RN ?Outcome: Progressing ?Goal: Levels of oxygenation will improve ?Outcome: Adequate for Discharge ?Goal: Ability to maintain adequate ventilation will improve ?Outcome: Adequate for Discharge ?  ?

## 2021-12-29 NOTE — Plan of Care (Signed)
?  Problem: Education: ?Goal: Knowledge of the prescribed therapeutic regimen will improve ?Outcome: Progressing ?  ?Problem: Activity: ?Goal: Ability to tolerate increased activity will improve ?Outcome: Progressing ?  ?Problem: Respiratory: ?Goal: Ability to maintain a clear airway will improve ?Outcome: Progressing ?  ?

## 2021-12-29 NOTE — Discharge Summary (Signed)
?Physician Discharge Summary ?  ?Patient: LEEMON AYALA MRN: [id number] DOB: 1930/02/05  ?Admit date:     12/27/2021  ?Discharge date: 12/29/21  ?Discharge Physician: Oswald Hillock  ? ?PCP: [name], MD  ? ?Recommendations at discharge:  ? ?Follow-up PCP in 1 week ? ?Discharge Diagnoses: ?Active Problems: ?  Dyslipidemia ?  HYPERTENSION, BENIGN ?  Coronary artery disease ?  OSA (obstructive sleep apnea) ?  Chronic diastolic CHF (congestive heart failure) ([place]) ?  COPD exacerbation ([place]) ?  Chronic respiratory failure with hypoxia ([facility]) ?  Acute metabolic encephalopathy ? ?Resolved Problems: ?  * No resolved hospital problems. * ? ?Hospital Course: ?88 year old male with history of COPD, chronic diastolic CHF, hypertension, hyperlipidemia, hypothyroidism, CAD, diabetes mellitus type 2 presented with shortness of breath.  Patient admitted with COPD exacerbation.  Patient is on home O2 at 3 L/min ? ?Assessment and Plan: ? ?Acute on chronic hypoxemic/hypercapnic respiratory failure ?-Secondary to COPD exacerbation ?-Patient is on 3 L pulmonary of oxygen at home ?-Improved after starting on Solu-Medrol, Xopenex nebulizer, Atrovent nebulizer, Mucinex and flutter valve. ?-We will discharge home on prednisone taper, switch albuterol to Xopenex due to anxiety, start Atrovent nebulizer every 6 hours as needed, continue Mucinex. ?-Continue home medications for COPD ? ?  ?Acute metabolic encephalopathy ?-Patient presented with acute metabolic encephalopathy ?-Resolved ?-CT head was ordered; which is negative for acute abnormality ?-UA is clear ? ?  ?Diabetes mellitus type 2 ?-Continue home regimen ?  ?Chronic diastolic CHF ?-Euvolemic ?  ?Hypothyroidism ?-Continue Synthroid ?  ?Hyperlipidemia ?-Continue rosuvastatin, Zetia ?  ?Hypertension ?-Continue metoprolol succinate ?  ?Obstructive sleep apnea ?-Patient is CPAP intolerant as per pulmonology notes ? ?Leukocytosis ?-Secondary to steroids ? ?  ? ? ?Consultants:   ?Procedures performed:  ?Disposition: Home ?Diet recommendation:  ?Discharge Diet Orders (From admission, onward)  ? ?  Start     Ordered  ? 12/29/21 0000  Diet - low sodium heart healthy       ? 12/29/21 0959  ? ?  ?  ? ?  ? ?Regular diet ?DISCHARGE MEDICATION: ?Allergies as of 12/29/2021   ? ?   Reactions  ? Clopidogrel Itching  ? ?  ? ?  ?Medication List  ?  ? ?STOP taking these medications   ? ?ipratropium-albuterol 0.5-2.5 (3) MG/3ML Soln ?Commonly known as: DUONEB ?  ? ?  ? ?TAKE these medications   ? ?albuterol 108 (90 Base) MCG/ACT inhaler ?Commonly known as: VENTOLIN HFA ?Inhale 2 puffs into the lungs every 6 (six) hours as needed for wheezing or shortness of breath. ?What changed: Another medication with the same name was removed. Continue taking this medication, and follow the directions you see here. ?  ?aspirin EC 81 MG tablet ?Take 81 mg by mouth daily. ?  ?Breztri Aerosphere 160-9-4.8 MCG/ACT Aero ?Generic drug: Budeson-Glycopyrrol-Formoterol ?Inhale 2 puffs into the lungs in the morning and at bedtime. ?  ?ezetimibe 10 MG tablet ?Commonly known as: ZETIA ?Take 1 tablet (10 mg total) by mouth daily. ?  ?GAS-X PO ?Take 1 tablet by mouth daily as needed (gas relief). ?  ?glimepiride 2 MG tablet ?Commonly known as: AMARYL ?Take 2 mg by mouth every evening. ?  ?guaiFENesin 600 MG 12 hr tablet ?Commonly known as: [place] ?Take 1 tablet (600 mg total) by mouth 2 (two) times daily. ?  ?ipratropium 0.02 % nebulizer solution ?Commonly known as: ATROVENT ?Take 2.5 mLs (0.5 mg total) by nebulization every 6 (six) hours as needed  for wheezing or shortness of breath. ?  ?levalbuterol 0.63 MG/3ML nebulizer solution ?Commonly known as: XOPENEX ?Take 3 mLs (0.63 mg total) by nebulization every 6 (six) hours as needed for wheezing or shortness of breath. ?  ?levothyroxine 150 MCG tablet ?Commonly known as: SYNTHROID ?Take 150 mcg by mouth every evening. ?  ?meloxicam 15 MG tablet ?Commonly known as: MOBIC ?Take 15 mg  by mouth daily as needed for pain. ?  ?metoprolol succinate 50 MG 24 hr tablet ?Commonly known as: TOPROL-XL ?Take 1 tablet (50 mg total) by mouth daily. Take with or immediately following a meal. ?  ?nitroGLYCERIN 0.4 MG SL tablet ?Commonly known as: NITROSTAT ?Place 1 tablet (0.4 mg total) under the tongue every 5 (five) minutes as needed for chest pain. ?  ?OXYGEN ?Inhale 3 application into the lungs. 3 liters n/c ?  ?predniSONE 10 MG tablet ?Commonly known as: DELTASONE ?Prednisone 40 mg po daily x 1 day then Prednisone 30 mg po daily x 1 day then Prednisone 20 mg po daily x 1 day then Prednisone 10 mg daily x 1 day then stop... ?  ?rosuvastatin 20 MG tablet ?Commonly known as: CRESTOR ?Take 1 tablet (20 mg total) by mouth daily. ?  ? ?  ? ? ?Discharge Exam: ?Danley Danker Weights  ? 12/27/21 1358  ?Weight: 86 kg  ? ?General-appears in no acute distress ?Heart-S1-S2, regular, no murmur auscultated ?Lungs-clear to auscultation bilaterally, no wheezing or crackles auscultated ?Abdomen-soft, nontender, no organomegaly ?Extremities-no edema in the lower extremities ?Neuro-alert, oriented x3, no focal deficit noted ? ?Condition at discharge: good ? ?The results of significant diagnostics from this hospitalization (including imaging, microbiology, ancillary and laboratory) are listed below for reference.  ? ?Imaging Studies: ?DG Chest 2 View ? ?Result Date: 12/27/2021 ?CLINICAL DATA:  Shortness of breath, worsening over the last several days. EXAM: CHEST - 2 VIEW COMPARISON:  10/30/2021 FINDINGS: Cardiomegaly. Coronary artery stents in place. The patient has taken a poor inspiration. Allowing for that, there is some chronic markings at the bases but no evidence acute edema, infiltrate, collapse or effusion. IMPRESSION: No active disease.  Poor inspiration.  Chronic lung markings. Electronically Signed   By: Nelson Chimes M.D.   On: 12/27/2021 15:21  ? ?CT HEAD WO CONTRAST (5MM) ? ?Result Date: 12/28/2021 ?CLINICAL DATA:  Mental  status change. EXAM: CT HEAD WITHOUT CONTRAST TECHNIQUE: Contiguous axial images were obtained from the base of the skull through the vertex without intravenous contrast. RADIATION DOSE REDUCTION: This exam was performed according to the departmental dose-optimization program which includes automated exposure control, adjustment of the mA and/or kV according to patient size and/or use of iterative reconstruction technique. COMPARISON:  06/02/2021 FINDINGS: Brain: No evidence of acute infarction, hemorrhage, hydrocephalus, extra-axial collection or mass lesion/mass effect. There is mild, age-appropriate, ventricular sulcal enlargement. Patchy white matter hypoattenuation is noted bilaterally consistent with mild to moderate chronic microvascular ischemic change. Small stable old right posterior, superior frontal lobe infarct. Vascular: No hyperdense vessel or unexpected calcification. Skull: Normal. Negative for fracture or focal lesion. Sinuses/Orbits: Globes and orbits are unremarkable. Sinuses are clear. Other: None. IMPRESSION: 1. No acute intracranial abnormalities. No change from the prior head CT. Electronically Signed   By: Lajean Manes M.D.   On: 12/28/2021 14:22  ? ?MYOCARDIAL PERFUSION IMAGING ? ?Result Date: 12/17/2021 ?  The study is normal. The study is low risk.   No ST deviation was noted.   LV perfusion is abnormal. Defect 1: There is a small  defect with mild reduction in uptake present in the apical lateral location(s) that is fixed. There is normal wall motion in the defect area. Consistent with artifact/soft tissue attenuation.   Left ventricular function is normal. Nuclear stress EF: 56 %. The left ventricular ejection fraction is normal (55-65%). End diastolic cavity size is normal.   Prior study available for comparison from 12/13/2016.  ? ?VAS US CAROTID ? ?Result Date: 12/13/2021 ?Carotid Arterial Duplex Study Patient Name:  KHAIDEN SEGRETO  Date of Exam:   12/13/2021 Medical Rec #: [id number]        Accession #:    [id number] Date of Birth: Mar 12, 1930       Patient Gender: M Patient Age:   81 years Exam Location:  [facility] Procedure:      VAS US CAROTID Referring Phys: Quillian Quince BENSIMHON --------------------

## 2022-01-04 DIAGNOSIS — J441 Chronic obstructive pulmonary disease with (acute) exacerbation: Secondary | ICD-10-CM | POA: Diagnosis not present

## 2022-01-08 ENCOUNTER — Other Ambulatory Visit: Payer: Self-pay

## 2022-01-08 ENCOUNTER — Ambulatory Visit (HOSPITAL_COMMUNITY)
Admission: RE | Admit: 2022-01-08 | Discharge: 2022-01-08 | Disposition: A | Discharge status: Home / Self Care | Payer: Medicare HMO | Source: Ambulatory Visit | Attending: Internal Medicine | Admitting: Internal Medicine

## 2022-01-08 DIAGNOSIS — G4730 Sleep apnea, unspecified: Secondary | ICD-10-CM | POA: Diagnosis not present

## 2022-01-08 DIAGNOSIS — Z87891 Personal history of nicotine dependence: Secondary | ICD-10-CM | POA: Diagnosis not present

## 2022-01-08 DIAGNOSIS — I5032 Chronic diastolic (congestive) heart failure: Secondary | ICD-10-CM | POA: Diagnosis not present

## 2022-01-08 DIAGNOSIS — I739 Peripheral vascular disease, unspecified: Secondary | ICD-10-CM | POA: Insufficient documentation

## 2022-01-08 DIAGNOSIS — I2510 Atherosclerotic heart disease of native coronary artery without angina pectoris: Secondary | ICD-10-CM | POA: Diagnosis not present

## 2022-01-08 DIAGNOSIS — I110 Hypertensive heart disease with heart failure: Secondary | ICD-10-CM | POA: Insufficient documentation

## 2022-01-08 DIAGNOSIS — J449 Chronic obstructive pulmonary disease, unspecified: Secondary | ICD-10-CM | POA: Diagnosis not present

## 2022-01-08 DIAGNOSIS — Z86718 Personal history of other venous thrombosis and embolism: Secondary | ICD-10-CM | POA: Insufficient documentation

## 2022-01-08 DIAGNOSIS — E785 Hyperlipidemia, unspecified: Secondary | ICD-10-CM | POA: Diagnosis not present

## 2022-01-08 LAB — ECHOCARDIOGRAM COMPLETE
Area-P 1/2: 2.99 cm2
S' Lateral: 2.2 cm

## 2022-02-04 DIAGNOSIS — J441 Chronic obstructive pulmonary disease with (acute) exacerbation: Secondary | ICD-10-CM | POA: Diagnosis not present

## 2022-02-07 ENCOUNTER — Ambulatory Visit: Payer: Medicare HMO | Admitting: Internal Medicine

## 2022-02-11 ENCOUNTER — Encounter: Payer: Self-pay | Admitting: Nurse Practitioner

## 2022-02-11 ENCOUNTER — Ambulatory Visit: Payer: Medicare HMO | Admitting: Nurse Practitioner

## 2022-02-11 ENCOUNTER — Ambulatory Visit (INDEPENDENT_AMBULATORY_CARE_PROVIDER_SITE_OTHER): Payer: Medicare HMO

## 2022-02-11 VITALS — BP 142/74 | HR 89 | Temp 98.0°F | Ht 63.0 in | Wt 186.0 lb

## 2022-02-11 DIAGNOSIS — J9611 Chronic respiratory failure with hypoxia: Secondary | ICD-10-CM | POA: Diagnosis not present

## 2022-02-11 DIAGNOSIS — R079 Chest pain, unspecified: Secondary | ICD-10-CM | POA: Diagnosis not present

## 2022-02-11 DIAGNOSIS — R0609 Other forms of dyspnea: Secondary | ICD-10-CM | POA: Diagnosis not present

## 2022-02-11 DIAGNOSIS — J441 Chronic obstructive pulmonary disease with (acute) exacerbation: Secondary | ICD-10-CM | POA: Diagnosis not present

## 2022-02-11 DIAGNOSIS — R0602 Shortness of breath: Secondary | ICD-10-CM | POA: Diagnosis not present

## 2022-02-11 LAB — BASIC METABOLIC PANEL
BUN: 15 mg/dL (ref 6–23)
CO2: 34 mEq/L — ABNORMAL HIGH (ref 19–32)
Calcium: 9.5 mg/dL (ref 8.4–10.5)
Chloride: 96 mEq/L (ref 96–112)
Creatinine, Ser: 0.98 mg/dL (ref 0.40–1.50)
GFR: 68.16 mL/min (ref >60.00)
Glucose, Bld: 159 mg/dL — ABNORMAL HIGH (ref 70–99)
Potassium: 3.7 mEq/L (ref 3.5–5.1)
Sodium: 137 mEq/L (ref 135–145)

## 2022-02-11 LAB — CBC WITH DIFFERENTIAL/PLATELET
Basophils Absolute: 0 10*3/uL (ref 0.0–0.1)
Basophils Relative: 0.6 % (ref 0.0–3.0)
Eosinophils Absolute: 0.5 10*3/uL (ref 0.0–0.7)
Eosinophils Relative: 6.4 % — ABNORMAL HIGH (ref 0.0–5.0)
HCT: 39.8 % (ref 39.0–52.0)
Hemoglobin: 13.2 g/dL (ref 13.0–17.0)
Lymphocytes Relative: 15.6 % (ref 12.0–46.0)
Lymphs Abs: 1.2 10*3/uL (ref 0.7–4.0)
MCHC: 33.1 g/dL (ref 30.0–36.0)
MCV: 91.2 fl (ref 78.0–100.0)
Monocytes Absolute: 0.7 10*3/uL (ref 0.1–1.0)
Monocytes Relative: 8.5 % (ref 3.0–12.0)
Neutro Abs: 5.5 10*3/uL (ref 1.4–7.7)
Neutrophils Relative %: 68.9 % (ref 43.0–77.0)
Platelets: 122 10*3/uL — ABNORMAL LOW (ref 150.0–400.0)
RBC: 4.36 Mil/uL (ref 4.22–5.81)
RDW: 13.9 % (ref 11.5–15.5)
WBC: 7.9 10*3/uL (ref 4.0–10.5)

## 2022-02-11 LAB — BRAIN NATRIURETIC PEPTIDE: Pro B Natriuretic peptide (BNP): 84 pg/mL (ref 0.0–100.0)

## 2022-02-11 LAB — D-DIMER, QUANTITATIVE: D-Dimer, Quant: 5.63 mcg/mL FEU — ABNORMAL HIGH (ref <0.50)

## 2022-02-11 MED ORDER — DOXYCYCLINE HYCLATE 100 MG PO TABS: 100.0000 mg | ORAL_TABLET | Freq: Two times a day (BID) | ORAL | 0 refills | Status: AC

## 2022-02-11 MED ORDER — METHYLPREDNISOLONE ACETATE 80 MG/ML IJ SUSP: 80.0000 mg | Freq: Once | INTRAMUSCULAR | Status: DC

## 2022-02-11 MED ORDER — AZITHROMYCIN 250 MG PO TABS: ORAL_TABLET | ORAL | 0 refills | Status: DC

## 2022-02-11 MED ORDER — PREDNISONE 10 MG PO TABS: ORAL_TABLET | ORAL | 0 refills | Status: DC

## 2022-02-11 NOTE — Assessment & Plan Note (Signed)
Walking oximetry without desaturation on 3 L/min POC.  No increased O2 demand.  Goal greater than 88 to 90%.  Advised to obtain pulse oximetry at home and monitor saturations. ?

## 2022-02-11 NOTE — Assessment & Plan Note (Signed)
Worsening shortness of breath over the last few weeks.  Does have bilateral lower extremity swelling; however left is significantly more so than right.  Also has been more immobile due to his recent hospitalization and breathing and has a history of DVT.  Will check D-dimer today; may need CTA or perfusion scan depending on results.  Also check CBC with differential to evaluate for possible anemia or leukocytosis. ?

## 2022-02-11 NOTE — Assessment & Plan Note (Addendum)
AECOPD.  Depo injection x1.  Will treat with prednisone taper and doxycycline course.  CXR did have some hazy opacities in bilateral mid lungs; suspected to be possibly artifactual; however unable to rule out superimposed infection or pulmonary edema.  Continue triple therapy with Breztri.  May need to consider transition to triple therapy nebs if high symptom burden continues.  Advised to continue levalbuterol nebs twice daily while sick.  Close follow-up ? ?Patient Instructions  ?Continue Breztri 2 puffs Twice daily. Brush tongue and rinse mouth afterwards ?Continue Albuterol inhaler 2 puffs or levalbuterol 3 mL neb every 6 hours as needed for shortness of breath or wheezing. Notify if symptoms persist despite rescue inhaler/neb use. ?Continue mucinex 600 mg Twice daily  ?Continue supplemental oxygen 3 lpm POC and continuous at night for goal oxygen >88-90% ? ?Prednisone taper. 4 tabs for 2 days, then 3 tabs for 2 days, 2 tabs for 2 days, then 1 tab for 2 days, then stop ?Doxycycline 1 tab Twice daily for 7 days. Take with food. Avoid direct sun exposure and wear sunscreen while taking  ? ?Labs today - BNP, BMET, d dimer, CBC with diff ? ?Follow up in 2 weeks with Dr. Chase Caller or Alanson Aly. If symptoms do not improve or worsen, please contact office for sooner follow up or seek emergency care. ? ? ?

## 2022-02-11 NOTE — Patient Instructions (Addendum)
Continue Breztri 2 puffs Twice daily. Brush tongue and rinse mouth afterwards ?Continue Albuterol inhaler 2 puffs or levalbuterol 3 mL neb every 6 hours as needed for shortness of breath or wheezing. Notify if symptoms persist despite rescue inhaler/neb use. ?Continue mucinex 600 mg Twice daily  ?Continue supplemental oxygen 3 lpm POC and continuous at night for goal oxygen >88-90% ? ?Prednisone taper. 4 tabs for 2 days, then 3 tabs for 2 days, 2 tabs for 2 days, then 1 tab for 2 days, then stop ?Doxycycline 1 tab Twice daily for 7 days. Take with food. Avoid direct sun exposure and wear sunscreen while taking  ? ?Labs today - BNP, BMET, d dimer, CBC with diff ? ?Follow up in 2 weeks with Dr. Chase Caller or Alanson Aly. If symptoms do not improve or worsen, please contact office for sooner follow up or seek emergency care. ?

## 2022-02-11 NOTE — Progress Notes (Signed)
? ?'@Patient'$  ID: [name], male    DOB: 1930-08-13, 88 y.o.   MRN: [id number] ? ?Chief Complaint  ?Patient presents with  ? Hospitalization Follow-up  ?  Pt is here for hospital follow up. Pt states he went into the hospital for SOB and wheezing. Pt was unable to maintain a steady O2, was given prednisone and breathing treatments in hospital   ? ? ?Referring provider: ?[name], MD ? ?HPI: ?88 year old male, former smoker followed for severe COPD, chronic respiratory failure, OSA CPAP intolerant.  He is a patient of [name] and last seen in office 10/31/2021 by [name] NP.  Past medical history significant for hypertension, PVD status post left stent, infrainguinal arterial occlusive disease, CAD, chronic diastolic CHF, DM 2, HLD, history of DVT, obesity. ? ?TEST/EVENTS:  ?11/17/2020 PFTs: FVC 71, FEV1 51, ratio 49, DLCO corrected for alveolar volume 57.  Moderately severe obstructive airway disease with diffusion defect.  No significant BD. ?12/27/2021 CXR 2 view: Coronary artery stents are in place.  There are some chronic markings at the bases but no evidence of acute edema, infiltrate, collapse or effusion ?01/08/2022 echocardiogram: EF 55 to 60%, G1 DD.  Unable to measure PASP.  No valvular dysfunction noted. ? ?11/01/2021: OV with Groce NP.  Treated for AECOPD with doxycycline course and prednisone taper.  Having some difficulty with compliance with Breztri.  Educated on the importance of consistent use.  Advised to use neb treatment in the morning and in the evening while sick.  Advised follow-up in 1 week. ? ?12/27/2021-12/29/2021: Hospitalization for acute on chronic respiratory failure related to COPD exacerbation.  Treated with IV steroids and neb treatments.  Discharged on prednisone taper, Atrovent nebs and changed albuterol to Xopenex.  Advised to continue Breztri twice daily.  Discharged on 3 L/min oxygen.  Stay was complicated by acute metabolic encephalopathy which had resolved upon  discharge.  No evidence of acute on chronic CHF exacerbation. ? ?02/11/2022: Today-acute visit ?Patient presents today with daughter for reported worsening shortness of breath over the last few weeks.  He has also had an increase in his chronic cough, slight increase in sputum production, and wheezing as well.  His daughter reports that he gets winded even with minimal activities.  She has been having to give him his nebulizer treatments a couple times a day.  Feels like what he is not currently on is not helping.  They have also noted that he has had some increased swelling in his lower legs, left greater than right.  He does have chronic venous insufficiency and peripheral vascular disease, which he is not a candidate for open surgery or endovascular approach.  Denies any calf pain or redness, hemoptysis, chest pain.  Does note that it is difficult to lie flat at night.  He is on his baseline 3 L/min; they have not been routinely monitoring his oxygen levels at home.  He has been compliant with Breztri 2 puffs twice a day, which his daughter has been helping to make sure that he gets. ? ?Allergies  ?Allergen Reactions  ? Clopidogrel Itching  ? ? ?Immunization History  ?Administered Date(s) Administered  ? Fluad Quad(high Dose 65+) 08/09/2021  ? Influenza Split 08/01/2009, 08/16/2010, 07/14/2013  ? Influenza, High Dose Seasonal PF 06/19/2016, 07/07/2017, 11/05/2018, 08/11/2019  ? Influenza,inj,quad, With Preservative 07/04/2014  ? Influenza-Unspecified 09/24/2011, 07/01/2012, 08/20/2013  ? PFIZER(Purple Top)SARS-COV-2 Vaccination 12/27/2019, 01/19/2020, 02/23/2021  ? Pneumococcal Conjugate-13 08/20/2013  ? Pneumococcal Polysaccharide-23 01/13/1999  ? Pneumococcal-Unspecified 08/14/2013  ?  Td 01/13/1999  ? Tdap 12/24/2011, 01/12/2019  ? Zoster, Live 10/23/2009  ? ? ?Past Medical History:  ?Diagnosis Date  ? Anxiety   ? Asthma   ? Chronic diastolic CHF (congestive heart failure) ([place])   ? diastolic   ? COPD (chronic  obstructive pulmonary disease) ([place])   ? Coronary artery disease   ? s/p PCI of RCA 03/2009 at which time there was 70% ramus and 90% RCA, cath 01/2011 showed patent stents in RCA with moderate prox disesae, aneurysmal left circ and small 90% ramus s/p PCI, patent LAD EF 55%  ? Diabetes mellitus   ? Diverticulitis Oct. 2013  ? bleeding in the past and Effient for his CAD was stopped  ? DVT (deep venous thrombosis) ([place])   ? GERD (gastroesophageal reflux disease)   ? Hypercholesterolemia   ? Hypertension   ? Hypothyroidism   ? Obesity (BMI 30-39.9)   ? OSA (obstructive sleep apnea)   ? severe on CPAP  ? Peripheral vascular disease ([place]) 11/2006  ? s/p left stent  ? Shortness of breath   ? Sleep apnea   ? severe OSA awaiting CPAP titration  ? ? ?Tobacco History: ?Social History  ? ?Tobacco Use  ?Smoking Status Former  ? Packs/day: 0.50  ? Years: 15.00  ? Pack years: 7.50  ? Types: Cigarettes, Cigars  ? Quit date: 10/15/1963  ? Years since quitting: 58.3  ?Smokeless Tobacco Never  ? ?Counseling given: Not Answered ? ? ?Outpatient Medications Prior to Visit  ?Medication Sig Dispense Refill  ? albuterol (PROVENTIL HFA;VENTOLIN HFA) 108 (90 BASE) MCG/ACT inhaler Inhale 2 puffs into the lungs every 6 (six) hours as needed for wheezing or shortness of breath.     ? aspirin EC 81 MG tablet Take 81 mg by mouth daily.    ? Budeson-Glycopyrrol-Formoterol (BREZTRI AEROSPHERE) 160-9-4.8 MCG/ACT AERO Inhale 2 puffs into the lungs in the morning and at bedtime.    ? ezetimibe (ZETIA) 10 MG tablet Take 1 tablet (10 mg total) by mouth daily. 90 tablet 3  ? glimepiride (AMARYL) 2 MG tablet Take 2 mg by mouth every evening.     ? guaiFENesin (MUCINEX) 600 MG 12 hr tablet Take 1 tablet (600 mg total) by mouth 2 (two) times daily. 30 tablet 0  ? ipratropium (ATROVENT) 0.02 % nebulizer solution Take 2.5 mLs (0.5 mg total) by nebulization every 6 (six) hours as needed for wheezing or shortness of breath. 75 mL 3  ? levalbuterol (XOPENEX) 0.63  MG/3ML nebulizer solution Take 3 mLs (0.63 mg total) by nebulization every 6 (six) hours as needed for wheezing or shortness of breath. 300 mL 3  ? levothyroxine (SYNTHROID, LEVOTHROID) 150 MCG tablet Take 150 mcg by mouth every evening.     ? meloxicam (MOBIC) 15 MG tablet Take 15 mg by mouth daily as needed for pain.    ? metoprolol succinate (TOPROL-XL) 50 MG 24 hr tablet Take 1 tablet (50 mg total) by mouth daily. Take with or immediately following a meal. 90 tablet 3  ? nitroGLYCERIN (NITROSTAT) 0.4 MG SL tablet Place 1 tablet (0.4 mg total) under the tongue every 5 (five) minutes as needed for chest pain. 25 tablet 6  ? OXYGEN Inhale 3 application into the lungs. 3 liters n/c    ? rosuvastatin (CRESTOR) 20 MG tablet Take 1 tablet (20 mg total) by mouth daily. 90 tablet 3  ? Simethicone (GAS-X PO) Take 1 tablet by mouth daily as needed (gas relief).    ?  predniSONE (DELTASONE) 10 MG tablet Prednisone 40 mg po daily x 1 day then Prednisone 30 mg po daily x 1 day then Prednisone 20 mg po daily x 1 day then Prednisone 10 mg daily x 1 day then stop... 10 tablet 0  ? ?No facility-administered medications prior to visit.  ? ? ? ?Review of Systems:  ? ?Constitutional: No weight loss or gain, night sweats, fevers, chills, fatigue, or lassitude. ?HEENT: No headaches, difficulty swallowing, tooth/dental problems, or sore throat. No sneezing, itching, ear ache, nasal congestion, or post nasal drip ?CV:  + Orthopnea , swelling in lower extremities L>R. no chest pain, PND, anasarca, dizziness, palpitations, syncope ?Resp: +shortness of breath with minimal exertion; cough; wheezing. No hemoptysis. No chest wall deformity ?GI:  No heartburn, indigestion, abdominal pain, nausea, vomiting, diarrhea, change in bowel habits, loss of appetite, bloody stools.  ?GU: No dysuria, change in color of urine, urgency or frequency.  No flank pain, no hematuria  ?Skin: No rash, lesions, ulcerations ?MSK:  No joint pain or swelling.  No  decreased range of motion.  No back pain. ?Neuro: No dizziness or lightheadedness.  ?Psych: No depression or anxiety. Mood stable.  ? ? ? ?Physical Exam: ? ?BP (!) 142/74 (BP Location: Left Arm, Patient Position: S

## 2022-02-12 ENCOUNTER — Telehealth: Payer: Self-pay | Admitting: Nurse Practitioner

## 2022-02-12 ENCOUNTER — Ambulatory Visit (HOSPITAL_COMMUNITY): Payer: Medicare HMO

## 2022-02-12 DIAGNOSIS — F419 Anxiety disorder, unspecified: Secondary | ICD-10-CM

## 2022-02-12 MED ORDER — LORAZEPAM 0.5 MG PO TABS: 0.5000 mg | ORAL_TABLET | Freq: Once | ORAL | 0 refills | Status: AC

## 2022-02-12 NOTE — Addendum Note (Signed)
Addended by: Monna Fam L on: 02/12/2022 10:25 AM ? ? Modules accepted: Orders ? ?

## 2022-02-12 NOTE — Progress Notes (Signed)
Please notify patient that his d dimer is much higher than it has been in the past, concerning for possible blood clot. Given his worsening SOB, unilateral leg swelling, and recent hospitalization, we need to schedule him for a STAT CTA chest with PE protocol. They mentioned yesterday that he may need something for the scan d/t his breathing/anxiety. I will send a one time dose of ativan for him to take 30-60 min prior to the scan. Please ensure someone drives him to and from the scan with this.  ? ?His eosinophils were also elevated on his CBC; indicating a possible allergen component to his symptoms. Have him start an OTC antihistamine such as zyrtec or claritin. If he continues to have respiratory symptoms, we may add on singulair. Thanks!

## 2022-02-12 NOTE — Telephone Encounter (Signed)
-----   Message from April Mohammed Kindle, RN sent at 02/12/2022 10:25 AM EDT ----- ?Katie, ? ?I spoke with patients daughter and she agreed to the CTA. I placed the order STAT. I just need you to order the one time dose of Ativan for the patient. I told the daughter that he needs to take the medication 30-60mins before test. I messaged the PCCs of the order.  ? ? ?----- Message ----- ?From: Clayton Bibles, NP ?Sent: 02/12/2022  10:09 AM EDT ?To: April Mohammed Kindle, RN ? ?Please notify patient that his d dimer is much higher than it has been in the past, concerning for possible blood clot. Given his worsening SOB, unilateral leg swelling, and recent hospitalization, we need to schedule him for a STAT CTA chest with PE ? protocol. They mentioned yesterday that he may need something for the scan d/t his breathing/anxiety. I will send a one time dose of ativan for him to take 30-60 min prior to the scan. Please ensure someone drives him to and from the scan with this. ?  ? ?His eosinophils were also elevated on his CBC; indicating a possible allergen component to his symptoms. Have him start an OTC antihistamine such as zyrtec or claritin. If he continues to have respiratory symptoms, we may add on singulair. Thanks!  ? ?

## 2022-02-13 ENCOUNTER — Ambulatory Visit (HOSPITAL_COMMUNITY)
Admission: RE | Admit: 2022-02-13 | Discharge: 2022-02-13 | Disposition: A | Discharge status: Home / Self Care | Payer: Medicare HMO | Source: Ambulatory Visit | Attending: Nurse Practitioner | Admitting: Nurse Practitioner

## 2022-02-13 DIAGNOSIS — J441 Chronic obstructive pulmonary disease with (acute) exacerbation: Secondary | ICD-10-CM | POA: Diagnosis not present

## 2022-02-13 DIAGNOSIS — R079 Chest pain, unspecified: Secondary | ICD-10-CM | POA: Diagnosis not present

## 2022-02-13 DIAGNOSIS — J9611 Chronic respiratory failure with hypoxia: Secondary | ICD-10-CM | POA: Insufficient documentation

## 2022-02-13 DIAGNOSIS — R7989 Other specified abnormal findings of blood chemistry: Secondary | ICD-10-CM | POA: Diagnosis not present

## 2022-02-13 DIAGNOSIS — R0609 Other forms of dyspnea: Secondary | ICD-10-CM | POA: Diagnosis not present

## 2022-02-13 MED ORDER — IOHEXOL 350 MG/ML SOLN
80.0000 mL | Freq: Once | INTRAVENOUS | Status: AC | PRN
  Administered 2022-02-13: 80 mL via INTRAVENOUS

## 2022-02-18 NOTE — Progress Notes (Signed)
Spoke with patient's daughter (DPR) on 5/3 regarding CTA findings - no evidence of PE; old areas of scarring; incidental findings of atherosclerosis. Verbalized understanding. Daughter reported patient's breathing seemed to be improving with interventions made at prior visit. All questions answered.

## 2022-02-25 ENCOUNTER — Encounter: Payer: Self-pay | Admitting: Nurse Practitioner

## 2022-02-25 ENCOUNTER — Ambulatory Visit: Payer: Medicare HMO | Admitting: Nurse Practitioner

## 2022-02-25 DIAGNOSIS — J309 Allergic rhinitis, unspecified: Secondary | ICD-10-CM | POA: Insufficient documentation

## 2022-02-25 DIAGNOSIS — J302 Other seasonal allergic rhinitis: Secondary | ICD-10-CM | POA: Diagnosis not present

## 2022-02-25 DIAGNOSIS — J449 Chronic obstructive pulmonary disease, unspecified: Secondary | ICD-10-CM

## 2022-02-25 DIAGNOSIS — J9611 Chronic respiratory failure with hypoxia: Secondary | ICD-10-CM

## 2022-02-25 NOTE — Assessment & Plan Note (Signed)
Appears compensated on current regimen. Elevated eosinophils on most recent CBC. Started on OTC antihistamine. May consider singulair if he has further respiratory or allergy symptoms in the future.  ?

## 2022-02-25 NOTE — Assessment & Plan Note (Signed)
Resolved AECOPD. Appears significantly better in office today. Advised to continue nebs Twice daily until he feels back to his baseline. Otherwise, continue current regimen. ? ?Patient Instructions  ?Continue Breztri 2 puffs Twice daily. Brush tongue and rinse mouth afterwards ?Continue Albuterol inhaler 2 puffs or levalbuterol 3 mL neb every 6 hours as needed for shortness of breath or wheezing. Notify if symptoms persist despite rescue inhaler/neb use. ?Continue mucinex 600 mg Twice daily  ?Continue supplemental oxygen 3 lpm POC and continuous at night for goal oxygen >88-90% ?  ?Follow up in 3 months with Dr. Chase Caller or Alanson Aly. If symptoms do not improve or worsen, please contact office for sooner follow up or seek emergency care. ? ? ?

## 2022-02-25 NOTE — Patient Instructions (Addendum)
Continue Breztri 2 puffs Twice daily. Brush tongue and rinse mouth afterwards ?Continue Albuterol inhaler 2 puffs or levalbuterol 3 mL neb every 6 hours as needed for shortness of breath or wheezing. Notify if symptoms persist despite rescue inhaler/neb use. ?Continue mucinex 600 mg Twice daily  ?Continue supplemental oxygen 3 lpm POC and continuous at night for goal oxygen >88-90% ?  ?Follow up in 3 months with Dr. Chase Caller or Alanson Aly. If symptoms do not improve or worsen, please contact office for sooner follow up or seek emergency care. ?

## 2022-02-25 NOTE — Progress Notes (Signed)
? ?'@Patient'$  ID: [name], male    DOB: [birth year], 88 y.o.   MRN: [id number] ? ?Chief Complaint  ?Patient presents with  ? Follow-up  ?  copd  ? ? ?Referring provider: ?[name], MD ? ?HPI: ?88 year old male, former smoker followed for severe COPD, chronic respiratory failure, OSA CPAP intolerant.  He is a patient of [name] and last seen in office 10/31/2021 by [name] NP.  Past medical history significant for hypertension, PVD status post left stent, infrainguinal arterial occlusive disease, CAD, chronic diastolic CHF, DM 2, HLD, history of DVT, obesity. ? ?TEST/EVENTS:  ?11/17/2020 PFTs: FVC 71, FEV1 51, ratio 49, DLCO corrected for alveolar volume 57.  Moderately severe obstructive airway disease with diffusion defect.  No significant BD. ?12/27/2021 CXR 2 view: Coronary artery stents are in place.  There are some chronic markings at the bases but no evidence of acute edema, infiltrate, collapse or effusion ?01/08/2022 echocardiogram: EF 55 to 60%, G1 DD.  Unable to measure PASP.  No valvular dysfunction noted. ? ?11/01/2021: OV with Groce NP.  Treated for AECOPD with doxycycline course and prednisone taper.  Having some difficulty with compliance with Breztri.  Educated on the importance of consistent use.  Advised to use neb treatment in the morning and in the evening while sick.  Advised follow-up in 1 week. ? ?12/27/2021-12/29/2021: Hospitalization for acute on chronic respiratory failure related to COPD exacerbation.  Treated with IV steroids and neb treatments.  Discharged on prednisone taper, Atrovent nebs and changed albuterol to Xopenex.  Advised to continue Breztri twice daily.  Discharged on 3 L/min oxygen.  Stay was complicated by acute metabolic encephalopathy which had resolved upon discharge.  No evidence of acute on chronic CHF exacerbation. ? ?02/11/2022: OV with Emiel Kielty,NP for worsening shortness of breath over the last few weeks.  He has also had an increase in his chronic cough, slight  increase in sputum production, and wheezing as well.  His daughter reports that he gets winded even with minimal activities.  She has been having to give him his nebulizer treatments a couple times a day.  Feels like what he is not currently on is not helping.  They have also noted that he has had some increased swelling in his lower legs, left greater than right.  He does have chronic venous insufficiency and peripheral vascular disease, which he is not a candidate for open surgery or endovascular approach. Does note that it is difficult to lie flat at night.  He is on his baseline 3 L/min; they have not been routinely monitoring his oxygen levels at home.  He has been compliant with Breztri 2 puffs twice a day, which his daughter has been helping to make sure that he gets. D dimer significantly higher than normal; CTA ordered which was negative for PE. Eosinophils were elevated on CBC, advised to start OTC antihistamine.  ? ?02/25/2022: Today - follow up ?Patient presents today with his daughter for follow up after being treated for AECOPD two weeks ago. He completed his doxycycline and prednisone courses. He reports feeling significantly better. Breathing is mostly back to his baseline. He still has an occasional wheeze, but resolves quickly with his breathing treatments. He is able to perform ADLs without difficulties. He has had no increase O2 demand and is currently on 3 lpm POC. Lower extremity swelling is unchanged from baseline. He denies cough, fevers. He continues on [place] Twice daily. Using xopenex nebs as needed.  ? ?Allergies  ?Allergen Reactions  ?  Clopidogrel Itching  ? ? ?Immunization History  ?Administered Date(s) Administered  ? Fluad Quad(high Dose 65+) 08/09/2021  ? Influenza Split 08/01/2009, 08/16/2010, 07/14/2013  ? Influenza, High Dose Seasonal PF 06/19/2016, 07/07/2017, 11/05/2018, 08/11/2019  ? Influenza,inj,quad, With Preservative 07/04/2014  ? Influenza-Unspecified 09/24/2011,  07/01/2012, 08/20/2013  ? PFIZER(Purple Top)SARS-COV-2 Vaccination 12/27/2019, 01/19/2020, 02/23/2021  ? Pneumococcal Conjugate-13 08/20/2013  ? Pneumococcal Polysaccharide-23 01/13/1999  ? Pneumococcal-Unspecified 08/14/2013  ? Td 01/13/1999  ? Tdap 12/24/2011, 01/12/2019  ? Zoster, Live 10/23/2009  ? ? ?Past Medical History:  ?Diagnosis Date  ? Anxiety   ? Asthma   ? Chronic diastolic CHF (congestive heart failure) ([place])   ? diastolic   ? COPD (chronic obstructive pulmonary disease) ([place])   ? Coronary artery disease   ? s/p PCI of RCA 03/2009 at which time there was 70% ramus and 90% RCA, cath 01/2011 showed patent stents in RCA with moderate prox disesae, aneurysmal left circ and small 90% ramus s/p PCI, patent LAD EF 55%  ? Diabetes mellitus   ? Diverticulitis Oct. 2013  ? bleeding in the past and Effient for his CAD was stopped  ? DVT (deep venous thrombosis) ([place])   ? GERD (gastroesophageal reflux disease)   ? Hypercholesterolemia   ? Hypertension   ? Hypothyroidism   ? Obesity (BMI 30-39.9)   ? OSA (obstructive sleep apnea)   ? severe on CPAP  ? Peripheral vascular disease ([place]) 11/2006  ? s/p left stent  ? Shortness of breath   ? Sleep apnea   ? severe OSA awaiting CPAP titration  ? ? ?Tobacco History: ?Social History  ? ?Tobacco Use  ?Smoking Status Former  ? Packs/day: 0.50  ? Years: 15.00  ? Pack years: 7.50  ? Types: Cigarettes, Cigars  ? Quit date: 10/15/1963  ? Years since quitting: 58.4  ?Smokeless Tobacco Never  ? ?Counseling given: Not Answered ? ? ?Outpatient Medications Prior to Visit  ?Medication Sig Dispense Refill  ? albuterol (PROVENTIL HFA;VENTOLIN HFA) 108 (90 BASE) MCG/ACT inhaler Inhale 2 puffs into the lungs every 6 (six) hours as needed for wheezing or shortness of breath.     ? aspirin EC 81 MG tablet Take 81 mg by mouth daily.    ? Budeson-Glycopyrrol-Formoterol (BREZTRI AEROSPHERE) 160-9-4.8 MCG/ACT AERO Inhale 2 puffs into the lungs in the morning and at bedtime.    ? ezetimibe (ZETIA) 10  MG tablet Take 1 tablet (10 mg total) by mouth daily. 90 tablet 3  ? glimepiride (AMARYL) 2 MG tablet Take 2 mg by mouth every evening.     ? guaiFENesin (MUCINEX) 600 MG 12 hr tablet Take 1 tablet (600 mg total) by mouth 2 (two) times daily. 30 tablet 0  ? ipratropium (ATROVENT) 0.02 % nebulizer solution Take 2.5 mLs (0.5 mg total) by nebulization every 6 (six) hours as needed for wheezing or shortness of breath. 75 mL 3  ? levalbuterol (XOPENEX) 0.63 MG/3ML nebulizer solution Take 3 mLs (0.63 mg total) by nebulization every 6 (six) hours as needed for wheezing or shortness of breath. 300 mL 3  ? levothyroxine (SYNTHROID, LEVOTHROID) 150 MCG tablet Take 150 mcg by mouth every evening.     ? meloxicam (MOBIC) 15 MG tablet Take 15 mg by mouth daily as needed for pain.    ? metoprolol succinate (TOPROL-XL) 50 MG 24 hr tablet Take 1 tablet (50 mg total) by mouth daily. Take with or immediately following a meal. 90 tablet 3  ? nitroGLYCERIN (NITROSTAT) 0.4 MG  SL tablet Place 1 tablet (0.4 mg total) under the tongue every 5 (five) minutes as needed for chest pain. 25 tablet 6  ? OXYGEN Inhale 3 application into the lungs. 3 liters n/c    ? rosuvastatin (CRESTOR) 20 MG tablet Take 1 tablet (20 mg total) by mouth daily. 90 tablet 3  ? Simethicone (GAS-X PO) Take 1 tablet by mouth daily as needed (gas relief).    ? predniSONE (DELTASONE) 10 MG tablet 4 tabs for 2 days, then 3 tabs for 2 days, 2 tabs for 2 days, then 1 tab for 2 days, then stop 20 tablet 0  ? ?Facility-Administered Medications Prior to Visit  ?Medication Dose Route Frequency Provider Last Rate Last Admin  ? methylPREDNISolone acetate (DEPO-MEDROL) injection 80 mg  80 mg Intramuscular Once Miguelina Fore, Karie Schwalbe, NP      ? ? ? ?Review of Systems:  ? ?Constitutional: No weight loss or gain, night sweats, fevers, chills, fatigue, or lassitude. ?HEENT: No headaches, difficulty swallowing, tooth/dental problems, or sore throat. No sneezing, itching, ear ache, nasal  congestion, or post nasal drip ?CV:  No orthopnea, leg swelling, chest pain, PND, anasarca, dizziness, palpitations, syncope ?Resp: +shortness of breath with exertion (improving). No cough. No wheezing. No hemop

## 2022-02-25 NOTE — Assessment & Plan Note (Signed)
No increased O2 requirement. Continue supplemental O2 3 lpm POC and continuous at night for goal SpO2 >88-90% ?

## 2022-03-06 DIAGNOSIS — J441 Chronic obstructive pulmonary disease with (acute) exacerbation: Secondary | ICD-10-CM | POA: Diagnosis not present

## 2022-03-13 ENCOUNTER — Other Ambulatory Visit: Payer: Self-pay | Admitting: *Deleted

## 2022-03-13 DIAGNOSIS — I739 Peripheral vascular disease, unspecified: Secondary | ICD-10-CM

## 2022-03-13 DIAGNOSIS — R600 Localized edema: Secondary | ICD-10-CM

## 2022-03-13 DIAGNOSIS — I872 Venous insufficiency (chronic) (peripheral): Secondary | ICD-10-CM

## 2022-03-13 DIAGNOSIS — Z48812 Encounter for surgical aftercare following surgery on the circulatory system: Secondary | ICD-10-CM

## 2022-03-19 DIAGNOSIS — I130 Hypertensive heart and chronic kidney disease with heart failure and stage 1 through stage 4 chronic kidney disease, or unspecified chronic kidney disease: Secondary | ICD-10-CM | POA: Diagnosis not present

## 2022-03-19 DIAGNOSIS — J9691 Respiratory failure, unspecified with hypoxia: Secondary | ICD-10-CM | POA: Diagnosis not present

## 2022-03-19 DIAGNOSIS — N183 Chronic kidney disease, stage 3 unspecified: Secondary | ICD-10-CM | POA: Diagnosis not present

## 2022-03-19 DIAGNOSIS — E039 Hypothyroidism, unspecified: Secondary | ICD-10-CM | POA: Diagnosis not present

## 2022-03-19 DIAGNOSIS — J449 Chronic obstructive pulmonary disease, unspecified: Secondary | ICD-10-CM | POA: Diagnosis not present

## 2022-03-19 DIAGNOSIS — I2510 Atherosclerotic heart disease of native coronary artery without angina pectoris: Secondary | ICD-10-CM | POA: Diagnosis not present

## 2022-03-19 DIAGNOSIS — E1169 Type 2 diabetes mellitus with other specified complication: Secondary | ICD-10-CM | POA: Diagnosis not present

## 2022-03-19 DIAGNOSIS — F0390 Unspecified dementia without behavioral disturbance: Secondary | ICD-10-CM | POA: Diagnosis not present

## 2022-03-19 DIAGNOSIS — I739 Peripheral vascular disease, unspecified: Secondary | ICD-10-CM | POA: Diagnosis not present

## 2022-03-21 ENCOUNTER — Ambulatory Visit: Payer: Medicare HMO | Admitting: Vascular Surgery

## 2022-03-21 ENCOUNTER — Encounter (HOSPITAL_COMMUNITY): Payer: Medicare HMO

## 2022-03-26 DIAGNOSIS — E261 Secondary hyperaldosteronism: Secondary | ICD-10-CM | POA: Diagnosis not present

## 2022-03-26 DIAGNOSIS — Z823 Family history of stroke: Secondary | ICD-10-CM | POA: Diagnosis not present

## 2022-03-26 DIAGNOSIS — I509 Heart failure, unspecified: Secondary | ICD-10-CM | POA: Diagnosis not present

## 2022-03-26 DIAGNOSIS — G4733 Obstructive sleep apnea (adult) (pediatric): Secondary | ICD-10-CM | POA: Diagnosis not present

## 2022-03-26 DIAGNOSIS — E119 Type 2 diabetes mellitus without complications: Secondary | ICD-10-CM | POA: Diagnosis not present

## 2022-03-26 DIAGNOSIS — I110 Hypertensive heart disease with heart failure: Secondary | ICD-10-CM | POA: Diagnosis not present

## 2022-03-26 DIAGNOSIS — Z7982 Long term (current) use of aspirin: Secondary | ICD-10-CM | POA: Diagnosis not present

## 2022-03-26 DIAGNOSIS — Z87891 Personal history of nicotine dependence: Secondary | ICD-10-CM | POA: Diagnosis not present

## 2022-03-26 DIAGNOSIS — K219 Gastro-esophageal reflux disease without esophagitis: Secondary | ICD-10-CM | POA: Diagnosis not present

## 2022-03-26 DIAGNOSIS — Z7984 Long term (current) use of oral hypoglycemic drugs: Secondary | ICD-10-CM | POA: Diagnosis not present

## 2022-03-26 DIAGNOSIS — J439 Emphysema, unspecified: Secondary | ICD-10-CM | POA: Diagnosis not present

## 2022-03-26 DIAGNOSIS — I25119 Atherosclerotic heart disease of native coronary artery with unspecified angina pectoris: Secondary | ICD-10-CM | POA: Diagnosis not present

## 2022-03-26 DIAGNOSIS — E039 Hypothyroidism, unspecified: Secondary | ICD-10-CM | POA: Diagnosis not present

## 2022-03-26 DIAGNOSIS — F325 Major depressive disorder, single episode, in full remission: Secondary | ICD-10-CM | POA: Diagnosis not present

## 2022-03-26 DIAGNOSIS — Z6833 Body mass index (BMI) 33.0-33.9, adult: Secondary | ICD-10-CM | POA: Diagnosis not present

## 2022-03-26 DIAGNOSIS — E669 Obesity, unspecified: Secondary | ICD-10-CM | POA: Diagnosis not present

## 2022-03-26 DIAGNOSIS — M1990 Unspecified osteoarthritis, unspecified site: Secondary | ICD-10-CM | POA: Diagnosis not present

## 2022-03-26 DIAGNOSIS — J9611 Chronic respiratory failure with hypoxia: Secondary | ICD-10-CM | POA: Diagnosis not present

## 2022-03-29 ENCOUNTER — Ambulatory Visit: Payer: Medicare HMO | Admitting: Nurse Practitioner

## 2022-03-29 ENCOUNTER — Encounter: Payer: Self-pay | Admitting: Nurse Practitioner

## 2022-03-29 VITALS — BP 118/72 | HR 75 | Temp 98.0°F | Ht 65.0 in | Wt 178.6 lb

## 2022-03-29 DIAGNOSIS — I5032 Chronic diastolic (congestive) heart failure: Secondary | ICD-10-CM | POA: Diagnosis not present

## 2022-03-29 DIAGNOSIS — J449 Chronic obstructive pulmonary disease, unspecified: Secondary | ICD-10-CM

## 2022-03-29 DIAGNOSIS — J9611 Chronic respiratory failure with hypoxia: Secondary | ICD-10-CM

## 2022-03-29 MED ORDER — FORMOTEROL FUMARATE 20 MCG/2ML IN NEBU: 20.0000 ug | INHALATION_SOLUTION | Freq: Two times a day (BID) | RESPIRATORY_TRACT | 3 refills | Status: AC

## 2022-03-29 MED ORDER — REVEFENACIN 175 MCG/3ML IN SOLN: 175.0000 ug | Freq: Every day | RESPIRATORY_TRACT | 0 refills | Status: DC

## 2022-03-29 MED ORDER — REVEFENACIN 175 MCG/3ML IN SOLN: 175.0000 ug | Freq: Every day | RESPIRATORY_TRACT | 3 refills | Status: DC

## 2022-03-29 MED ORDER — BUDESONIDE 0.5 MG/2ML IN SUSP: 0.5000 mg | Freq: Two times a day (BID) | RESPIRATORY_TRACT | 3 refills | Status: AC

## 2022-03-29 NOTE — Assessment & Plan Note (Addendum)
Possible AECOPD vs fluid overload. Given his breathing is stable and he has BLE edema, recommended we trial short course of lasix and hold off on further prednisone. He has also not been consistent with Breztri use. We discussed the important of maintenance therapies. He does do well with neb treatments. I do believe his disease is progressing as well given with his declining activity intolerance and DOE over the past few months so we will trial change to triple therapy nebs. They are agreeable to this plan and his daughters will ensure he completes his neb therapy in the AM. We also discussed the involvement of palliative care - pt is open to this but they would like to discuss with the rest of the family. They will notify if they would like to move forward with a referral.  Patient Instructions  Continue Breztri 2 puffs Twice daily until you receive your nebulizer treatments in the mail. Brush tongue and rinse mouth afterwards Continue Albuterol inhaler 2 puffs or levalbuterol 3 mL neb every 6 hours as needed for shortness of breath or wheezing. Notify if symptoms persist despite rescue inhaler/neb use. Continue mucinex 600 mg Twice daily  Continue supplemental oxygen 3 lpm POC and continuous at night for goal oxygen >88-90%  Once you receive your nebulizer treatments, please start... -Budesonide 2 mL Twice daily. Brush tongue and rinse mouth afterwards -Perforomist 2 mL Twice daily  -Yupelri 3 mL once daily   Increase your lasix to 40 mg daily for 5 days. Take in AM. We will recheck your labs when you come back into the office  Please let me know if you continue to have wheezing, shortness of breath or swelling in your legs.    Follow up in two weeks with Dr. Chase Caller or Alanson Aly. If symptoms do not improve or worsen, please contact office for sooner follow up or seek emergency care.

## 2022-03-29 NOTE — Addendum Note (Signed)
Addended by: Fran Lowes on: 03/29/2022 04:06 PM   Modules accepted: Orders

## 2022-03-29 NOTE — Patient Instructions (Addendum)
Continue Breztri 2 puffs Twice daily until you receive your nebulizer treatments in the mail. Brush tongue and rinse mouth afterwards Continue Albuterol inhaler 2 puffs or levalbuterol 3 mL neb every 6 hours as needed for shortness of breath or wheezing. Notify if symptoms persist despite rescue inhaler/neb use. Continue mucinex 600 mg Twice daily  Continue supplemental oxygen 3 lpm POC and continuous at night for goal oxygen >88-90%  Once you receive your nebulizer treatments, please start... -Budesonide 2 mL Twice daily. Brush tongue and rinse mouth afterwards -Perforomist 2 mL Twice daily  -Yupelri 3 mL once daily   Increase your lasix to 40 mg daily for 5 days. Take in AM. We will recheck your labs when you come back into the office  Please let me know if you continue to have wheezing, shortness of breath or swelling in your legs.    Follow up in two weeks with Dr. Chase Caller or Alanson Aly. If symptoms do not improve or worsen, please contact office for sooner follow up or seek emergency care.

## 2022-03-29 NOTE — Progress Notes (Signed)
$'@Patient'X$  ID: [name], male    DOB: [birth year], 88 y.o.   MRN: [id number]  Chief Complaint  Patient presents with   Follow-up    Patient states that his wheezing has gotten worse over the past couple of weeks.    Referring provider: [name], MD  HPI: 88 year old male, former smoker followed for severe COPD, chronic respiratory failure, OSA CPAP intolerant.  He is a patient of Dr. Golden Simon and last seen in office 02/25/2022 by [name] - Warner NP.  Past medical history significant for hypertension, PVD status post left stent, infrainguinal arterial occlusive disease, CAD, chronic diastolic CHF, DM 2, HLD, history of DVT, obesity.  TEST/EVENTS:  11/17/2020 PFTs: FVC 71, FEV1 51, ratio 49, DLCO corrected for alveolar volume 57.  Moderately severe obstructive airway disease with diffusion defect.  No significant BD. 12/27/2021 CXR 2 view: Coronary artery stents are in place.  There are some chronic markings at the bases but no evidence of acute edema, infiltrate, collapse or effusion 01/08/2022 echocardiogram: EF 55 to 60%, G1 DD.  Unable to measure PASP.  No valvular dysfunction noted. 02/13/2022 CTA chest: atherosclerosis and CAD. No evidence of acute PE. There are some small peripheral bandlike densities in the segmental branches of the RLL, which could represent scarring. No LAD. There is a 1 cm calcified granuloma in the LUL along the fissure, unchanged. Small area of scarring in the lingula as well as additional bibasilar linear atelectasis/scarring, unchanged. No acute process identified.   11/01/2021: [name] with Eddie Confer NP.  Treated for AECOPD with doxycycline course and prednisone taper.  Having some difficulty with compliance with Breztri.  Educated on the importance of consistent use.  Advised to use neb treatment in the morning and in the evening while sick.  Advised follow-up in 1 week.  12/27/2021-12/29/2021: Hospitalization for acute on chronic respiratory failure related to COPD  exacerbation.  Treated with IV steroids and neb treatments.  Discharged on prednisone taper, Atrovent nebs and changed albuterol to Xopenex.  Advised to continue Breztri twice daily.  Discharged on 3 L/min oxygen.  Stay was complicated by acute metabolic encephalopathy which had resolved upon discharge.  No evidence of acute on chronic CHF exacerbation.  02/11/2022: OV with Eddie Broker,NP for worsening shortness of breath over the last few weeks.  He has also had an increase in his chronic cough, slight increase in sputum production, and wheezing as well.  His daughter reports that he gets winded even with minimal activities.  She has been having to give him his nebulizer treatments a couple times a day.  Feels like what he is not currently on is not helping.  They have also noted that he has had some increased swelling in his lower legs, left greater than right.  He does have chronic venous insufficiency and peripheral vascular disease, which he is not a candidate for open surgery or endovascular approach. Does note that it is difficult to lie flat at night.  He is on his baseline 3 L/min; they have not been routinely monitoring his oxygen levels at home.  He has been compliant with Breztri 2 puffs twice a day, which his daughter has been helping to make sure that he gets. D dimer significantly higher than normal; CTA ordered which was negative for PE. Eosinophils were elevated on CBC, advised to start OTC antihistamine.   02/25/2022: OV with Eddie Cambre NP for follow up after being treated for AECOPD two weeks ago. He completed his doxycycline and prednisone courses. He reports feeling  significantly better. Breathing is mostly back to his baseline. He still has an occasional wheeze, but resolves quickly with his breathing treatments. He is able to perform ADLs without difficulties. He has had no increase O2 demand and is currently on 3 lpm POC. Lower extremity swelling is unchanged from baseline. He denies cough, fevers. He  continues on [name] Twice daily. Using xopenex nebs as needed.   03/29/2022: Today - acute Patient presents today with daughter for increased wheezing over the last week or so.  She reports that the nurse from [name] had come out last week and after listening to him, advised that they come in for an office visit because he sounded pretty wheezy.  He reports that he feels like he is breathing relatively okay.  Does not feel significantly different from his baseline; however, he has been declining for the past few months.  He does have an occasional cough which is dry.  His daughter does report that he has been sleeping a lot more during the day over the last month.  Does not seem to want to do as much as he used to.  He does still enjoy going fishing and hunting when able.  He has also had some increased swelling in his legs.  He does have Lasix at home but does not take this as prescribed.  He denies any hemoptysis, productive cough, fevers, night sweats, palpitations, chest pain, anorexia.  He continues on [name]; however his daughter reports that he has been missing his morning dose relatively frequently because he has been sleeping in pretty late.  She has been giving him his rescue neb when she notices the wheezing which does seem to help some.  Allergies  Allergen Reactions   Clopidogrel Itching    Immunization History  Administered Date(s) Administered   Fluad Quad(high Dose 65+) 08/09/2021   Influenza Split 08/01/2009, 08/16/2010, 07/14/2013   Influenza, High Dose Seasonal PF 06/19/2016, 07/07/2017, 11/05/2018, 08/11/2019   Influenza,inj,quad, With Preservative 07/04/2014   Influenza-Unspecified 09/24/2011, 07/01/2012, 08/20/2013   PFIZER(Purple Top)SARS-COV-2 Vaccination 12/27/2019, 01/19/2020, 02/23/2021   Pneumococcal Conjugate-13 08/20/2013   Pneumococcal Polysaccharide-23 01/13/1999   Pneumococcal-Unspecified 08/14/2013   Td 01/13/1999   Tdap 12/24/2011, 01/12/2019   Zoster, Live  10/23/2009    Past Medical History:  Diagnosis Date   Anxiety    Asthma    Chronic diastolic CHF (congestive heart failure) (HCC)    diastolic    COPD (chronic obstructive pulmonary disease) ([place])    Coronary artery disease    s/p PCI of RCA 03/2009 at which time there was 70% ramus and 90% RCA, cath 01/2011 showed patent stents in RCA with moderate prox disesae, aneurysmal left circ and small 90% ramus s/p PCI, patent LAD EF 55%   Diabetes mellitus    Diverticulitis Oct. 2013   bleeding in the past and Effient for his CAD was stopped   DVT (deep venous thrombosis) (HCC)    GERD (gastroesophageal reflux disease)    Hypercholesterolemia    Hypertension    Hypothyroidism    Obesity (BMI 30-39.9)    OSA (obstructive sleep apnea)    severe on CPAP   Peripheral vascular disease ([place]) 11/2006   s/p left stent   Shortness of breath    Sleep apnea    severe OSA awaiting CPAP titration    Tobacco History: Social History   Tobacco Use  Smoking Status Former   Packs/day: 0.50   Years: 15.00   Total pack years: 7.50   Types:  Cigarettes, Cigars   Quit date: 10/15/1963   Years since quitting: 58.4  Smokeless Tobacco Never   Counseling given: Not Answered   Outpatient Medications Prior to Visit  Medication Sig Dispense Refill   albuterol (PROVENTIL HFA;VENTOLIN HFA) 108 (90 BASE) MCG/ACT inhaler Inhale 2 puffs into the lungs every 6 (six) hours as needed for wheezing or shortness of breath.      aspirin EC 81 MG tablet Take 81 mg by mouth daily.     Budeson-Glycopyrrol-Formoterol (BREZTRI AEROSPHERE) 160-9-4.8 MCG/ACT AERO Inhale 2 puffs into the lungs in the morning and at bedtime.     ezetimibe (ZETIA) 10 MG tablet Take 1 tablet (10 mg total) by mouth daily. 90 tablet 3   glimepiride (AMARYL) 2 MG tablet Take 2 mg by mouth every evening.      guaiFENesin (MUCINEX) 600 MG 12 hr tablet Take 1 tablet (600 mg total) by mouth 2 (two) times daily. 30 tablet 0   ipratropium (ATROVENT)  0.02 % nebulizer solution Take 2.5 mLs (0.5 mg total) by nebulization every 6 (six) hours as needed for wheezing or shortness of breath. 75 mL 3   levalbuterol (XOPENEX) 0.63 MG/3ML nebulizer solution Take 3 mLs (0.63 mg total) by nebulization every 6 (six) hours as needed for wheezing or shortness of breath. 300 mL 3   levothyroxine (SYNTHROID, LEVOTHROID) 150 MCG tablet Take 150 mcg by mouth every evening.      meloxicam (MOBIC) 15 MG tablet Take 15 mg by mouth daily as needed for pain.     metoprolol succinate (TOPROL-XL) 50 MG 24 hr tablet Take 1 tablet (50 mg total) by mouth daily. Take with or immediately following a meal. 90 tablet 3   nitroGLYCERIN (NITROSTAT) 0.4 MG SL tablet Place 1 tablet (0.4 mg total) under the tongue every 5 (five) minutes as needed for chest pain. 25 tablet 6   OXYGEN Inhale 3 application into the lungs. 3 liters n/c     predniSONE (DELTASONE) 10 MG tablet 4 tabs for 2 days, then 3 tabs for 2 days, 2 tabs for 2 days, then 1 tab for 2 days, then stop 20 tablet 0   rosuvastatin (CRESTOR) 20 MG tablet Take 1 tablet (20 mg total) by mouth daily. 90 tablet 3   sertraline (ZOLOFT) 50 MG tablet SMARTSIG:1 Tablet(s) By Mouth Every Evening     Simethicone (GAS-X PO) Take 1 tablet by mouth daily as needed (gas relief).     Facility-Administered Medications Prior to Visit  Medication Dose Route Frequency Provider Last Rate Last Admin   methylPREDNISolone acetate (DEPO-MEDROL) injection 80 mg  80 mg Intramuscular Once Areana Kosanke, Karie Schwalbe, NP         Review of Systems:   Constitutional: No weight loss or gain, night sweats, fevers, chills, or lassitude. +fatigue (chronic; seems more lethargic over the last month) HEENT: No headaches, difficulty swallowing, tooth/dental problems, or sore throat. No sneezing, itching, ear ache, nasal congestion, or post nasal drip CV:  +lower extremity leg swelling. No orthopnea, chest pain, PND, anasarca, dizziness, palpitations, syncope Resp:  +shortness of breath with exertion (baseline); minimal dry cough; wheezing. No hemoptysis. No chest wall deformity GI:  No heartburn, indigestion, abdominal pain, nausea, vomiting, diarrhea, change in bowel habits, loss of appetite, bloody stools.  Skin: No rash, lesions, ulcerations MSK:  No joint pain or swelling.  No decreased range of motion.  No back pain. Neuro: No dizziness or lightheadedness.  Psych: No depression or anxiety. Mood stable.  Physical Exam:  BP 118/72 (BP Location: Right Arm, Patient Position: Sitting, Cuff Size: Normal)   Pulse 75   Temp 98 F (36.7 C) (Oral)   Ht '5\' 5"'$  (1.651 m)   Wt 178 lb 9.6 oz (81 kg)   SpO2 95%   BMI 29.72 kg/m   GEN: Pleasant, interactive; elderly; obese ; in no acute distress. HEENT:  Normocephalic and atraumatic. PERRLA. Sclera white. Nasal turbinates pink, moist and patent bilaterally. No rhinorrhea present. Oropharynx pink and moist, without exudate or edema. No lesions, ulcerations, or postnasal drip.  NECK:  Supple w/ fair ROM. No JVD present. Normal carotid impulses w/o bruits. Thyroid symmetrical with no goiter or nodules palpated. No lymphadenopathy.   CV: RRR, no m/r/g, +1 pitting edema BLE extremity. Pulses intact, +2 bilaterally. No cyanosis, pallor or clubbing. PULMONARY:  Unlabored, regular breathing.  Diminished with very minimal end expiratory wheezes bilaterally A&P; clears with deep inspiration. No accessory muscle use. No dullness to percussion. GI: BS present and normoactive. Soft, non-tender to palpation.  MSK: No erythema, warmth or tenderness. Cap refil <2 sec all extrem. No deformities or joint swelling noted.  Neuro: A/Ox3. No focal deficits noted.   Skin: Warm, no lesions or rashe Psych: Normal affect and behavior. Judgement and thought content appropriate.     Lab Results:  CBC    Component Value Date/Time   WBC 7.9 02/11/2022 1542   RBC 4.36 02/11/2022 1542   HGB 13.2 02/11/2022 1542   HGB 13.8  01/26/2018 0956   HCT 39.8 02/11/2022 1542   HCT 41.4 01/26/2018 0956   PLT 122.0 (L) 02/11/2022 1542   PLT 166 01/26/2018 0956   MCV 91.2 02/11/2022 1542   MCV 88 01/26/2018 0956   MCH 29.6 12/29/2021 0418   MCHC 33.1 02/11/2022 1542   RDW 13.9 02/11/2022 1542   RDW 14.1 01/26/2018 0956   LYMPHSABS 1.2 02/11/2022 1542   LYMPHSABS 2.1 12/05/2016 0957   MONOABS 0.7 02/11/2022 1542   EOSABS 0.5 02/11/2022 1542   EOSABS 0.2 12/05/2016 0957   BASOSABS 0.0 02/11/2022 1542   BASOSABS 0.0 12/05/2016 0957    BMET    Component Value Date/Time   NA 137 02/11/2022 1542   NA 140 07/07/2020 0948   K 3.7 02/11/2022 1542   CL 96 02/11/2022 1542   CO2 34 (H) 02/11/2022 1542   GLUCOSE 159 (H) 02/11/2022 1542   BUN 15 02/11/2022 1542   BUN 14 07/07/2020 0948   CREATININE 0.98 02/11/2022 1542   CALCIUM 9.5 02/11/2022 1542   GFRNONAA >60 12/29/2021 0418   GFRAA 63 07/07/2020 0948    BNP    Component Value Date/Time   BNP 93.4 12/27/2021 1426     Imaging:  No results found.        Latest Ref Rng & Units 11/17/2020   12:55 PM 05/20/2018    2:41 PM  PFT Results  FVC-Pre L 1.77  1.63   FVC-Predicted Pre % 70  60   FVC-Post L 1.79  1.71   FVC-Predicted Post % 71  63   Pre FEV1/FVC % % 47  48   Post FEV1/FCV % % 49  46   FEV1-Pre L 0.84  0.77   FEV1-Predicted Pre % 49  42   FEV1-Post L 0.87  0.79   DLCO uncorrected ml/min/mmHg 10.65    DLCO UNC% % 57    DLCO corrected ml/min/mmHg 10.65    DLCO COR %Predicted % 57    DLVA Predicted %  81      No results found for: "NITRICOXIDE"      Assessment & Plan:   COPD GOLD III Possible AECOPD vs fluid overload. Given his breathing is stable and he has BLE edema, recommended we trial short course of lasix and hold off on further prednisone. He has also not been consistent with Breztri use. We discussed the important of maintenance therapies. He does do well with neb treatments. I do believe his disease is progressing as well  given with his declining activity intolerance and DOE over the past few months so we will trial change to triple therapy nebs. They are agreeable to this plan and his daughters will ensure he completes his neb therapy in the AM. We also discussed the involvement of palliative care - pt is open to this but they would like to discuss with the rest of the family. They will notify if they would like to move forward with a referral.  Patient Instructions  Continue Breztri 2 puffs Twice daily until you receive your nebulizer treatments in the mail. Brush tongue and rinse mouth afterwards Continue Albuterol inhaler 2 puffs or levalbuterol 3 mL neb every 6 hours as needed for shortness of breath or wheezing. Notify if symptoms persist despite rescue inhaler/neb use. Continue mucinex 600 mg Twice daily  Continue supplemental oxygen 3 lpm POC and continuous at night for goal oxygen >88-90%  Once you receive your nebulizer treatments, please start... -Budesonide 2 mL Twice daily. Brush tongue and rinse mouth afterwards -Perforomist 2 mL Twice daily  -Yupelri 3 mL once daily   Increase your lasix to 40 mg daily for 5 days. Take in AM. We will recheck your labs when you come back into the office  Please let me know if you continue to have wheezing, shortness of breath or swelling in your legs.    Follow up in two weeks with Dr. Chase Caller or Alanson Aly. If symptoms do not improve or worsen, please contact office for sooner follow up or seek emergency care.    Chronic respiratory failure with hypoxia (HCC) Clinically stable without any increasing O2 requirements. Continue 3 lpm supplemental oxygen for goal >88-90%.  Chronic diastolic CHF (congestive heart failure) (HCC) Evidence of fluid overload on exam and minimal wheeze. Advised that he take his lasix 40 mg for 5 days. Check BMET at follow up. Notify if no improvement in symptoms. Follow up with cardiology or PCP as scheduled.      I spent 35  minutes of dedicated to the care of this patient on the date of this encounter to include pre-visit review of records, face-to-face time with the patient discussing conditions above, post visit ordering of testing, clinical documentation with the electronic health record, making appropriate referrals as documented, and communicating necessary findings to members of the patients care team.  Clayton Bibles, NP 03/29/2022  Pt aware and understands NP's role.

## 2022-03-29 NOTE — Assessment & Plan Note (Signed)
Clinically stable without any increasing O2 requirements. Continue 3 lpm supplemental oxygen for goal >88-90%.

## 2022-03-29 NOTE — Assessment & Plan Note (Addendum)
Evidence of fluid overload on exam and minimal wheeze. Advised that he take his lasix 40 mg for 5 days. Check BMET at follow up. Notify if no improvement in symptoms. Follow up with cardiology or PCP as scheduled.

## 2022-04-06 DIAGNOSIS — J441 Chronic obstructive pulmonary disease with (acute) exacerbation: Secondary | ICD-10-CM | POA: Diagnosis not present

## 2022-04-12 ENCOUNTER — Encounter: Payer: Self-pay | Admitting: Nurse Practitioner

## 2022-04-12 ENCOUNTER — Ambulatory Visit: Payer: Medicare HMO | Admitting: Nurse Practitioner

## 2022-04-12 VITALS — BP 116/68 | HR 92 | Temp 98.2°F | Ht 63.0 in | Wt 166.6 lb

## 2022-04-12 DIAGNOSIS — J449 Chronic obstructive pulmonary disease, unspecified: Secondary | ICD-10-CM | POA: Diagnosis not present

## 2022-04-12 DIAGNOSIS — I5032 Chronic diastolic (congestive) heart failure: Secondary | ICD-10-CM | POA: Diagnosis not present

## 2022-04-12 DIAGNOSIS — J9611 Chronic respiratory failure with hypoxia: Secondary | ICD-10-CM

## 2022-04-12 NOTE — Assessment & Plan Note (Signed)
Significant improvement in wheezing and DOE with correction of fluid volume status. Check BMET today. Advised they continue on daily lasix if kidney function stable. He does not have follow up scheduled with cardiology so recommended they contact Dr. Radford Pax or Dr. Haroldine Laws for further management.

## 2022-04-12 NOTE — Progress Notes (Signed)
$'@Patient'h$  ID: [name], male    DOB: [birth year], 88 y.o.   MRN: [id number]  Chief Complaint  Patient presents with   Follow-up    Follow up. Patient states that things are going good but he doesn't feel good today.     Referring provider: [name], MD  HPI: 88 year old male, former smoker followed for severe COPD, chronic respiratory failure, OSA CPAP intolerant.  He is a patient of [name] and last seen in office 03/29/2022 by [facility] NP.  Past medical history significant for hypertension, PVD status post left stent, infrainguinal arterial occlusive disease, CAD, chronic diastolic CHF, DM 2, HLD, history of DVT, obesity.  TEST/EVENTS:  11/17/2020 PFTs: FVC 71, FEV1 51, ratio 49, DLCO corrected for alveolar volume 57.  Moderately severe obstructive airway disease with diffusion defect.  No significant BD. 12/27/2021 CXR 2 view: Coronary artery stents are in place.  There are some chronic markings at the bases but no evidence of acute edema, infiltrate, collapse or effusion 01/08/2022 echocardiogram: EF 55 to 60%, G1 DD.  Unable to measure PASP.  No valvular dysfunction noted. 02/13/2022 CTA chest: atherosclerosis and CAD. No evidence of acute PE. There are some small peripheral bandlike densities in the segmental branches of the RLL, which could represent scarring. No LAD. There is a 1 cm calcified granuloma in the LUL along the fissure, unchanged. Small area of scarring in the lingula as well as additional bibasilar linear atelectasis/scarring, unchanged. No acute process identified.   11/01/2021: [name] with [name] NP.  Treated for AECOPD with doxycycline course and prednisone taper.  Having some difficulty with compliance with Breztri.  Educated on the importance of consistent use.  Advised to use neb treatment in the morning and in the evening while sick.  Advised follow-up in 1 week.  12/27/2021-12/29/2021: Hospitalization for acute on chronic respiratory failure related to COPD  exacerbation.  Treated with IV steroids and neb treatments.  Discharged on prednisone taper, Atrovent nebs and changed albuterol to Xopenex.  Advised to continue Breztri twice daily.  Discharged on 3 L/min oxygen.  Stay was complicated by acute metabolic encephalopathy which had resolved upon discharge.  No evidence of acute on chronic CHF exacerbation.  02/11/2022: OV with Jeanni Allshouse,NP for worsening shortness of breath over the last few weeks.  He has also had an increase in his chronic cough, slight increase in sputum production, and wheezing as well.  His daughter reports that he gets winded even with minimal activities.  She has been having to give him his nebulizer treatments a couple times a day.  Feels like what he is not currently on is not helping.  They have also noted that he has had some increased swelling in his lower legs, left greater than right.  He does have chronic venous insufficiency and peripheral vascular disease, which he is not a candidate for open surgery or endovascular approach. Does note that it is difficult to lie flat at night.  He is on his baseline 3 L/min; they have not been routinely monitoring his oxygen levels at home.  He has been compliant with Breztri 2 puffs twice a day, which his daughter has been helping to make sure that he gets. D dimer significantly higher than normal; CTA ordered which was negative for PE. Eosinophils were elevated on CBC, advised to start OTC antihistamine.   02/25/2022: OV with Jarmarcus Wambold NP for follow up after being treated for AECOPD two weeks ago. He completed his doxycycline and prednisone courses. He  reports feeling significantly better. Breathing is mostly back to his baseline. He still has an occasional wheeze, but resolves quickly with his breathing treatments. He is able to perform ADLs without difficulties. He has had no increase O2 demand and is currently on 3 lpm POC. Lower extremity swelling is unchanged from baseline. He denies cough, fevers. He  continues on [place] Twice daily. Using xopenex nebs as needed.   03/29/2022: OV with Sylvanus Telford NP for increased wheezing over the last week or so.  She reports that the nurse from [facility] had come out last week and after listening to him, advised that they come in for an office visit because he sounded pretty wheezy.  He reports that he feels like he is breathing relatively okay.  Does not feel significantly different from his baseline; however, he has been declining for the past few months.  He does have an occasional cough which is dry.  His daughter does report that he has been sleeping a lot more during the day over the last month.  Does not seem to want to do as much as he used to.  He does still enjoy going fishing and hunting when able.  He has also had some increased swelling in his legs. Possible AECOPD vs fluid overload. Given his breathing is stable and he has BLE edema, recommended we trial short course of lasix and hold off on further prednisone. He has also not been consistent with Breztri use. We discussed the important of maintenance therapies. He does do well with neb treatments. I do believe his disease is progressing as well given with his declining activity intolerance and DOE over the past few months so we will trial change to triple therapy nebs. They are agreeable to this plan and his daughters will ensure he completes his neb therapy in the AM. We also discussed the involvement of palliative care - pt is open to this but they would like to discuss with the rest of the family. They will notify if they would like to move forward with a referral.   04/12/2022: Today - follow up Patient presents today with daughter for follow up. Last time, we treated him for concern for fluid overload with lasix of 40 mg for 5 days. They ended up giving him 20 mg instead, which he has been taking daily since. He had great response to it and feels like his breathing is better than it has been in a long time and  wheezing has resolved. His legs are no longer swollen and he is down about 10 pounds on his weight. We had also planned to switch him to triple therapy nebs; however, these were not affordable to they have kept him on the [place]. They do not have any concerns or complaints today. Oxygen is stable on his baseline of 3 lpm.   Allergies  Allergen Reactions   Clopidogrel Itching    Immunization History  Administered Date(s) Administered   Fluad Quad(high Dose 65+) 08/09/2021   Influenza Split 08/01/2009, 08/16/2010, 07/14/2013   Influenza, High Dose Seasonal PF 06/19/2016, 07/07/2017, 11/05/2018, 08/11/2019   Influenza,inj,quad, With Preservative 07/04/2014   Influenza-Unspecified 09/24/2011, 07/01/2012, 08/20/2013   PFIZER(Purple Top)SARS-COV-2 Vaccination 12/27/2019, 01/19/2020, 02/23/2021   Pneumococcal Conjugate-13 08/20/2013   Pneumococcal Polysaccharide-23 01/13/1999   Pneumococcal-Unspecified 08/14/2013   Td 01/13/1999   Tdap 12/24/2011, 01/12/2019   Zoster, Live 10/23/2009    Past Medical History:  Diagnosis Date   Anxiety    Asthma    Chronic diastolic CHF (  congestive heart failure) (HCC)    diastolic    COPD (chronic obstructive pulmonary disease) ([place])    Coronary artery disease    s/p PCI of RCA 03/2009 at which time there was 70% ramus and 90% RCA, cath 01/2011 showed patent stents in RCA with moderate prox disesae, aneurysmal left circ and small 90% ramus s/p PCI, patent LAD EF 55%   Diabetes mellitus    Diverticulitis Oct. 2013   bleeding in the past and Effient for his CAD was stopped   DVT (deep venous thrombosis) (HCC)    GERD (gastroesophageal reflux disease)    Hypercholesterolemia    Hypertension    Hypothyroidism    Obesity (BMI 30-39.9)    OSA (obstructive sleep apnea)    severe on CPAP   Peripheral vascular disease ([place]) 11/2006   s/p left stent   Shortness of breath    Sleep apnea    severe OSA awaiting CPAP titration    Tobacco History: Social  History   Tobacco Use  Smoking Status Former   Packs/day: 0.50   Years: 15.00   Total pack years: 7.50   Types: Cigarettes, Cigars   Quit date: 10/15/1963   Years since quitting: 58.5  Smokeless Tobacco Never   Counseling given: Not Answered   Outpatient Medications Prior to Visit  Medication Sig Dispense Refill   albuterol (PROVENTIL HFA;VENTOLIN HFA) 108 (90 BASE) MCG/ACT inhaler Inhale 2 puffs into the lungs every 6 (six) hours as needed for wheezing or shortness of breath.      aspirin EC 81 MG tablet Take 81 mg by mouth daily.     Budeson-Glycopyrrol-Formoterol (BREZTRI AEROSPHERE) 160-9-4.8 MCG/ACT AERO Inhale 2 puffs into the lungs in the morning and at bedtime.     budesonide (PULMICORT) 0.5 MG/2ML nebulizer solution Take 2 mLs (0.5 mg total) by nebulization 2 (two) times daily. 360 mL 3   ezetimibe (ZETIA) 10 MG tablet Take 1 tablet (10 mg total) by mouth daily. 90 tablet 3   formoterol (PERFOROMIST) 20 MCG/2ML nebulizer solution Take 2 mLs (20 mcg total) by nebulization 2 (two) times daily. 360 mL 3   glimepiride (AMARYL) 2 MG tablet Take 2 mg by mouth every evening.      guaiFENesin (MUCINEX) 600 MG 12 hr tablet Take 1 tablet (600 mg total) by mouth 2 (two) times daily. 30 tablet 0   ipratropium (ATROVENT) 0.02 % nebulizer solution Take 2.5 mLs (0.5 mg total) by nebulization every 6 (six) hours as needed for wheezing or shortness of breath. 75 mL 3   levalbuterol (XOPENEX) 0.63 MG/3ML nebulizer solution Take 3 mLs (0.63 mg total) by nebulization every 6 (six) hours as needed for wheezing or shortness of breath. 300 mL 3   levothyroxine (SYNTHROID, LEVOTHROID) 150 MCG tablet Take 150 mcg by mouth every evening.      meloxicam (MOBIC) 15 MG tablet Take 15 mg by mouth daily as needed for pain.     metoprolol succinate (TOPROL-XL) 50 MG 24 hr tablet Take 1 tablet (50 mg total) by mouth daily. Take with or immediately following a meal. 90 tablet 3   nitroGLYCERIN (NITROSTAT) 0.4 MG  SL tablet Place 1 tablet (0.4 mg total) under the tongue every 5 (five) minutes as needed for chest pain. 25 tablet 6   OXYGEN Inhale 3 application into the lungs. 3 liters n/c     predniSONE (DELTASONE) 10 MG tablet 4 tabs for 2 days, then 3 tabs for 2 days, 2 tabs for 2  days, then 1 tab for 2 days, then stop 20 tablet 0   revefenacin (YUPELRI) 175 MCG/3ML nebulizer solution Take 3 mLs (175 mcg total) by nebulization daily. 270 mL 3   revefenacin (YUPELRI) 175 MCG/3ML nebulizer solution Take 3 mLs (175 mcg total) by nebulization daily. 90 mL 0   rosuvastatin (CRESTOR) 20 MG tablet Take 1 tablet (20 mg total) by mouth daily. 90 tablet 3   sertraline (ZOLOFT) 50 MG tablet SMARTSIG:1 Tablet(s) By Mouth Every Evening     Simethicone (GAS-X PO) Take 1 tablet by mouth daily as needed (gas relief).     Facility-Administered Medications Prior to Visit  Medication Dose Route Frequency Provider Last Rate Last Admin   methylPREDNISolone acetate (DEPO-MEDROL) injection 80 mg  80 mg Intramuscular Once Aluel Schwarz, Karie Schwalbe, NP         Review of Systems:   Constitutional: No weight loss or gain, night sweats, fevers, chills, or lassitude. +fatigue (chronic; seems more lethargic over the last month) HEENT: No headaches, difficulty swallowing, tooth/dental problems, or sore throat. No sneezing, itching, ear ache, nasal congestion, or post nasal drip CV:  No orthopnea, chest pain, PND, lower extremity leg swelling, anasarca, dizziness, palpitations, syncope Resp: +shortness of breath with exertion (baseline). No cough. No wheeze. No hemoptysis. No chest wall deformity GI:  No heartburn, indigestion, abdominal pain, nausea, vomiting, diarrhea, change in bowel habits, loss of appetite, bloody stools.  Skin: No rash, lesions, ulcerations MSK:  No joint pain or swelling.  No decreased range of motion.  No back pain. Neuro: No dizziness or lightheadedness.  Psych: No depression or anxiety. Mood stable.      Physical Exam:  BP 116/68 (BP Location: Right Arm, Patient Position: Sitting, Cuff Size: Normal)   Pulse 92   Temp 98.2 F (36.8 C) (Oral)   Ht '5\' 3"'$  (1.6 m)   Wt 166 lb 9.6 oz (75.6 kg)   SpO2 95%   BMI 29.51 kg/m   GEN: Pleasant, interactive; elderly; obese ; in no acute distress. HEENT:  Normocephalic and atraumatic. PERRLA. Sclera white. Nasal turbinates pink, moist and patent bilaterally. No rhinorrhea present. Oropharynx pink and moist, without exudate or edema. No lesions, ulcerations, or postnasal drip.  NECK:  Supple w/ fair ROM. No JVD present. Normal carotid impulses w/o bruits. Thyroid symmetrical with no goiter or nodules palpated. No lymphadenopathy.   CV: RRR, no m/r/g, no peripheral edema. Pulses intact, +2 bilaterally. No cyanosis, pallor or clubbing. PULMONARY:  Unlabored, regular breathing.  Diminished bilaterally A&P w/o wheeze/rhonchi/rales. No accessory muscle use. No dullness to percussion. GI: BS present and normoactive. Soft, non-tender to palpation.  MSK: No erythema, warmth or tenderness. Cap refil <2 sec all extrem. No deformities or joint swelling noted.  Neuro: A/Ox3. No focal deficits noted.   Skin: Warm, no lesions or rashe Psych: Normal affect and behavior. Judgement and thought content appropriate.     Lab Results:  CBC    Component Value Date/Time   WBC 7.9 02/11/2022 1542   RBC 4.36 02/11/2022 1542   HGB 13.2 02/11/2022 1542   HGB 13.8 01/26/2018 0956   HCT 39.8 02/11/2022 1542   HCT 41.4 01/26/2018 0956   PLT 122.0 (L) 02/11/2022 1542   PLT 166 01/26/2018 0956   MCV 91.2 02/11/2022 1542   MCV 88 01/26/2018 0956   MCH 29.6 12/29/2021 0418   MCHC 33.1 02/11/2022 1542   RDW 13.9 02/11/2022 1542   RDW 14.1 01/26/2018 0956   LYMPHSABS 1.2 02/11/2022 1542  LYMPHSABS 2.1 12/05/2016 0957   MONOABS 0.7 02/11/2022 1542   EOSABS 0.5 02/11/2022 1542   EOSABS 0.2 12/05/2016 0957   BASOSABS 0.0 02/11/2022 1542   BASOSABS 0.0 12/05/2016  0957    BMET    Component Value Date/Time   NA 137 02/11/2022 1542   NA 140 07/07/2020 0948   K 3.7 02/11/2022 1542   CL 96 02/11/2022 1542   CO2 34 (H) 02/11/2022 1542   GLUCOSE 159 (H) 02/11/2022 1542   BUN 15 02/11/2022 1542   BUN 14 07/07/2020 0948   CREATININE 0.98 02/11/2022 1542   CALCIUM 9.5 02/11/2022 1542   GFRNONAA >60 12/29/2021 0418   GFRAA 63 07/07/2020 0948    BNP    Component Value Date/Time   BNP 93.4 12/27/2021 1426     Imaging:  No results found.        Latest Ref Rng & Units 11/17/2020   12:55 PM 05/20/2018    2:41 PM  PFT Results  FVC-Pre L 1.77  1.63   FVC-Predicted Pre % 70  60   FVC-Post L 1.79  1.71   FVC-Predicted Post % 71  63   Pre FEV1/FVC % % 47  48   Post FEV1/FCV % % 49  46   FEV1-Pre L 0.84  0.77   FEV1-Predicted Pre % 49  42   FEV1-Post L 0.87  0.79   DLCO uncorrected ml/min/mmHg 10.65    DLCO UNC% % 57    DLCO corrected ml/min/mmHg 10.65    DLCO COR %Predicted % 57    DLVA Predicted % 81      No results found for: "NITRICOXIDE"      Assessment & Plan:   Chronic diastolic CHF (congestive heart failure) (HCC) Significant improvement in wheezing and DOE with correction of fluid volume status. Check BMET today. Advised they continue on daily lasix if kidney function stable. He does not have follow up scheduled with cardiology so recommended they contact Dr. Radford Pax or Dr. Haroldine Laws for further management.   COPD GOLD III Severe COPD with recent exacerbations, last treated with steroids and abx in May. Recent flare seems to be related to volume overload from CHF as he had improvement with lasix course. We were going to change him to neb treatments, as he feels more benefit from his nebulizer than brezti; however, this was not an affordable option for them. We will continue him on Breztri for now and PRN albuterol.   Patient Instructions  Continue Breztri 2 puffs Twice daily until you receive your nebulizer treatments in  the mail. Brush tongue and rinse mouth afterwards Continue Albuterol inhaler 2 puffs or levalbuterol 3 mL neb every 6 hours as needed for shortness of breath or wheezing. Notify if symptoms persist despite rescue inhaler/neb use. Continue mucinex 600 mg Twice daily  Continue lasix 20 mg daily  Continue supplemental oxygen 3 lpm POC and continuous at night for goal oxygen >88-90%  Call cardiology for a follow up appointment to discuss your fluid pills and leg swelling  Follow up in 3 months with Dr. Chase Caller. If symptoms do not improve or worsen, please contact office for sooner follow up or seek emergency care.    Chronic respiratory failure with hypoxia (HCC) Stable without any increased O2 demand. Continue on supplemental 3 lpm POC with activity and continuous at night. Goal >88-90%.    I spent 25 minutes of dedicated to the care of this patient on the date of this encounter to include  pre-visit review of records, face-to-face time with the patient discussing conditions above, post visit ordering of testing, clinical documentation with the electronic health record, making appropriate referrals as documented, and communicating necessary findings to members of the patients care team.  Clayton Bibles, NP 04/12/2022  Pt aware and understands NP's role.

## 2022-04-12 NOTE — Assessment & Plan Note (Signed)
Severe COPD with recent exacerbations, last treated with steroids and abx in May. Recent flare seems to be related to volume overload from CHF as he had improvement with lasix course. We were going to change him to neb treatments, as he feels more benefit from his nebulizer than brezti; however, this was not an affordable option for them. We will continue him on Breztri for now and PRN albuterol.   Patient Instructions  Continue Breztri 2 puffs Twice daily until you receive your nebulizer treatments in the mail. Brush tongue and rinse mouth afterwards Continue Albuterol inhaler 2 puffs or levalbuterol 3 mL neb every 6 hours as needed for shortness of breath or wheezing. Notify if symptoms persist despite rescue inhaler/neb use. Continue mucinex 600 mg Twice daily  Continue lasix 20 mg daily  Continue supplemental oxygen 3 lpm POC and continuous at night for goal oxygen >88-90%  Call cardiology for a follow up appointment to discuss your fluid pills and leg swelling  Follow up in 3 months with Dr. Chase Caller. If symptoms do not improve or worsen, please contact office for sooner follow up or seek emergency care.

## 2022-04-12 NOTE — Assessment & Plan Note (Signed)
Stable without any increased O2 demand. Continue on supplemental 3 lpm POC with activity and continuous at night. Goal >88-90%.

## 2022-04-12 NOTE — Patient Instructions (Addendum)
Continue Breztri 2 puffs Twice daily until you receive your nebulizer treatments in the mail. Brush tongue and rinse mouth afterwards Continue Albuterol inhaler 2 puffs or levalbuterol 3 mL neb every 6 hours as needed for shortness of breath or wheezing. Notify if symptoms persist despite rescue inhaler/neb use. Continue mucinex 600 mg Twice daily  Continue lasix 20 mg daily  Continue supplemental oxygen 3 lpm POC and continuous at night for goal oxygen >88-90%  Call cardiology for a follow up appointment to discuss your fluid pills and leg swelling  Follow up in 3 months with Dr. Chase Caller. If symptoms do not improve or worsen, please contact office for sooner follow up or seek emergency care.

## 2022-04-13 LAB — BASIC METABOLIC PANEL
BUN/Creatinine Ratio: 16 (calc) (ref 6–22)
BUN: 23 mg/dL (ref 7–25)
CO2: 31 mmol/L (ref 20–32)
Calcium: 9.9 mg/dL (ref 8.6–10.3)
Chloride: 95 mmol/L — ABNORMAL LOW (ref 98–110)
Creat: 1.43 mg/dL — ABNORMAL HIGH (ref 0.70–1.22)
Glucose, Bld: 127 mg/dL — ABNORMAL HIGH (ref 65–99)
Potassium: 3.4 mmol/L — ABNORMAL LOW (ref 3.5–5.3)
Sodium: 139 mmol/L (ref 135–146)

## 2022-04-17 ENCOUNTER — Other Ambulatory Visit: Payer: Self-pay | Admitting: Nurse Practitioner

## 2022-04-17 DIAGNOSIS — E876 Hypokalemia: Secondary | ICD-10-CM

## 2022-04-17 MED ORDER — POTASSIUM CHLORIDE CRYS ER 20 MEQ PO TBCR: 20.0000 meq | EXTENDED_RELEASE_TABLET | Freq: Every day | ORAL | 0 refills | Status: AC

## 2022-04-17 NOTE — Progress Notes (Signed)
Please notify patient that his potassium was low so I am going to send in replacement for him to take since he is now on lasix 20 mg daily. He also had an increase in his creatinine, which his PCP will need to monitor. Please advise him to call and schedule follow up with Dr Brigitte Pulse because they will need to recheck his labs and ensure kidney function is stable and potassium has normalized. Thanks.

## 2022-05-17 ENCOUNTER — Telehealth: Payer: Self-pay | Admitting: Nurse Practitioner

## 2022-05-17 NOTE — Telephone Encounter (Signed)
Fax received from pt's pharmacy for a refill request of potassium chloride. Katie, please advise if you are okay with [place] refilling med for pt.

## 2022-05-20 NOTE — Telephone Encounter (Signed)
Needs to come back in for BMET prior to refill.

## 2022-05-20 NOTE — Telephone Encounter (Signed)
Spoke with pt's daughter who states pt has not taken any of the last order of potassium so he does not need a refill at this time.   Routing to [employer] as FYI.

## 2022-05-21 NOTE — Telephone Encounter (Signed)
I had sent replacement for him a month ago due to low potassium levels. Please verify whether he took this or did not. Here was my recommendation at the time "Please notify patient that his potassium was low so I am going to send in replacement for him to take since he is now on lasix 20 mg daily. He also had an increase in his creatinine, which his PCP will need to monitor. Please advise him to call and schedule follow up with Dr Brigitte Pulse because they will need to recheck his labs and ensure kidney function is stable and potassium has normalized. Thanks."   Either way, we need to recheck BMET because if he did not take the supplements, his potassium could still be low. If he did take them, we need to ensure that his levels have corrected since he is on lasix. Thanks.

## 2022-05-27 NOTE — Telephone Encounter (Signed)
Called and spoke with patients daughter. She stated that patient did not take the medications that were sent in. Patients daughter wanted to schedule an office visit due to shortness of breath, wheezing, and he has a bad cough.   OV has been scheduled.   Nothing further needed.

## 2022-05-28 NOTE — Progress Notes (Deleted)
$'@Patient'B$  ID: [name], male    DOB: 1929/10/24, 88 y.o.   MRN: [id number]  No chief complaint on file.   Referring provider: [name], MD  HPI: 88 year old male, former smoker followed for severe COPD, chronic respiratory failure, OSA CPAP intolerant.  He is a patient of [name] and last seen in office 04/12/2022 by [name].  Past medical history significant for hypertension, PVD status post left stent, infrainguinal arterial occlusive disease, CAD, chronic diastolic CHF, DM 2, HLD, history of DVT, obesity.  TEST/EVENTS:  11/17/2020 PFTs: FVC 71, FEV1 51, ratio 49, DLCO corrected for alveolar volume 57.  Moderately severe obstructive airway disease with diffusion defect.  No significant BD. 12/27/2021 CXR 2 view: Coronary artery stents are in place.  There are some chronic markings at the bases but no evidence of acute edema, infiltrate, collapse or effusion 01/08/2022 echocardiogram: EF 55 to 60%, G1 DD.  Unable to measure PASP.  No valvular dysfunction noted. 02/13/2022 CTA chest: atherosclerosis and CAD. No evidence of acute PE. There are some small peripheral bandlike densities in the segmental branches of the RLL, which could represent scarring. No LAD. There is a 1 cm calcified granuloma in the LUL along the fissure, unchanged. Small area of scarring in the lingula as well as additional bibasilar linear atelectasis/scarring, unchanged. No acute process identified.   11/01/2021: [name] with [name] NP.  Treated for AECOPD with doxycycline course and prednisone taper.  Having some difficulty with compliance with Breztri.  Educated on the importance of consistent use.  Advised to use neb treatment in the morning and in the evening while sick.  Advised follow-up in 1 week.  12/27/2021-12/29/2021: Hospitalization for acute on chronic respiratory failure related to COPD exacerbation.  Treated with IV steroids and neb treatments.  Discharged on prednisone taper, Atrovent nebs and changed  albuterol to Xopenex.  Advised to continue Breztri twice daily.  Discharged on 3 L/min oxygen.  Stay was complicated by acute metabolic encephalopathy which had resolved upon discharge.  No evidence of acute on chronic CHF exacerbation.  02/11/2022: OV with [name],NP for worsening shortness of breath over the last few weeks.  He has also had an increase in his chronic cough, slight increase in sputum production, and wheezing as well.  His daughter reports that he gets winded even with minimal activities.  She has been having to give him his nebulizer treatments a couple times a day.  Feels like what he is not currently on is not helping.  They have also noted that he has had some increased swelling in his lower legs, left greater than right.  He does have chronic venous insufficiency and peripheral vascular disease, which he is not a candidate for open surgery or endovascular approach. Does note that it is difficult to lie flat at night.  He is on his baseline 3 L/min; they have not been routinely monitoring his oxygen levels at home.  He has been compliant with Breztri 2 puffs twice a day, which his daughter has been helping to make sure that he gets. D dimer significantly higher than normal; CTA ordered which was negative for PE. Eosinophils were elevated on CBC, advised to start OTC antihistamine.   02/25/2022: OV with [name] for follow up after being treated for AECOPD two weeks ago. He completed his doxycycline and prednisone courses. He reports feeling significantly better. Breathing is mostly back to his baseline. He still has an occasional wheeze, but resolves quickly with his breathing treatments.  He is able to perform ADLs without difficulties. He has had no increase O2 demand and is currently on 3 lpm POC. Lower extremity swelling is unchanged from baseline. He denies cough, fevers. He continues on [place] Twice daily. Using xopenex nebs as needed.   03/29/2022: OV with [name] for increased wheezing  over the last week or so.  She reports that the nurse from [facility] had come out last week and after listening to him, advised that they come in for an office visit because he sounded pretty wheezy.  He reports that he feels like he is breathing relatively okay.  Does not feel significantly different from his baseline; however, he has been declining for the past few months.  He does have an occasional cough which is dry.  His daughter does report that he has been sleeping a lot more during the day over the last month.  Does not seem to want to do as much as he used to.  He does still enjoy going fishing and hunting when able.  He has also had some increased swelling in his legs. Possible AECOPD vs fluid overload. Given his breathing is stable and he has BLE edema, recommended we trial short course of lasix and hold off on further prednisone. He has also not been consistent with Breztri use. We discussed the important of maintenance therapies. He does do well with neb treatments. I do believe his disease is progressing as well given with his declining activity intolerance and DOE over the past few months so we will trial change to triple therapy nebs. They are agreeable to this plan and his daughters will ensure he completes his neb therapy in the AM. We also discussed the involvement of palliative care - pt is open to this but they would like to discuss with the rest of the family. They will notify if they would like to move forward with a referral.   04/12/2022: OV with [name] for follow up. Last time, we treated him for concern for fluid overload with lasix of 40 mg for 5 days. They ended up giving him 20 mg instead, which he has been taking daily since. He had great response to it and feels like his breathing is better than it has been in a long time and wheezing has resolved. His legs are no longer swollen and he is down about 10 pounds on his weight. We had also planned to switch him to triple therapy nebs;  however, these were not affordable to they have kept him on the [place]. They do not have any concerns or complaints today. Oxygen is stable on his baseline of 3 lpm. BMET with hypokalemia to 3.4 and increase in creatinine. Replacement sent for K. Advised he followed up with his PCP or his cardiologist.   Allergies  Allergen Reactions   Clopidogrel Itching    Immunization History  Administered Date(s) Administered   Fluad Quad(high Dose 65+) 08/09/2021   Influenza Split 08/01/2009, 08/16/2010, 07/14/2013   Influenza, High Dose Seasonal PF 06/19/2016, 07/07/2017, 11/05/2018, 08/11/2019   Influenza,inj,quad, With Preservative 07/04/2014   Influenza-Unspecified 09/24/2011, 07/01/2012, 08/20/2013   PFIZER(Purple Top)SARS-COV-2 Vaccination 12/27/2019, 01/19/2020, 02/23/2021   Pneumococcal Conjugate-13 08/20/2013   Pneumococcal Polysaccharide-23 01/13/1999   Pneumococcal-Unspecified 08/14/2013   Td 01/13/1999   Tdap 12/24/2011, 01/12/2019   Zoster, Live 10/23/2009    Past Medical History:  Diagnosis Date   Anxiety    Asthma    Chronic diastolic CHF (congestive heart failure) ([place])  diastolic    COPD (chronic obstructive pulmonary disease) ([place])    Coronary artery disease    s/p PCI of RCA 03/2009 at which time there was 70% ramus and 90% RCA, cath 01/2011 showed patent stents in RCA with moderate prox disesae, aneurysmal left circ and small 90% ramus s/p PCI, patent LAD EF 55%   Diabetes mellitus    Diverticulitis Oct. 2013   bleeding in the past and Effient for his CAD was stopped   DVT (deep venous thrombosis) ([facility])    GERD (gastroesophageal reflux disease)    Hypercholesterolemia    Hypertension    Hypothyroidism    Obesity (BMI 30-39.9)    OSA (obstructive sleep apnea)    severe on CPAP   Peripheral vascular disease ([place]) 11/2006   s/p left stent   Shortness of breath    Sleep apnea    severe OSA awaiting CPAP titration    Tobacco History: Social History   Tobacco  Use  Smoking Status Former   Packs/day: 0.50   Years: 15.00   Total pack years: 7.50   Types: Cigarettes, Cigars   Quit date: 10/15/1963   Years since quitting: 58.6  Smokeless Tobacco Never   Counseling given: Not Answered   Outpatient Medications Prior to Visit  Medication Sig Dispense Refill   albuterol (PROVENTIL HFA;VENTOLIN HFA) 108 (90 BASE) MCG/ACT inhaler Inhale 2 puffs into the lungs every 6 (six) hours as needed for wheezing or shortness of breath.      aspirin EC 81 MG tablet Take 81 mg by mouth daily.     Budeson-Glycopyrrol-Formoterol (BREZTRI AEROSPHERE) 160-9-4.8 MCG/ACT AERO Inhale 2 puffs into the lungs in the morning and at bedtime.     budesonide (PULMICORT) 0.5 MG/2ML nebulizer solution Take 2 mLs (0.5 mg total) by nebulization 2 (two) times daily. 360 mL 3   ezetimibe (ZETIA) 10 MG tablet Take 1 tablet (10 mg total) by mouth daily. 90 tablet 3   formoterol (PERFOROMIST) 20 MCG/2ML nebulizer solution Take 2 mLs (20 mcg total) by nebulization 2 (two) times daily. 360 mL 3   glimepiride (AMARYL) 2 MG tablet Take 2 mg by mouth every evening.      guaiFENesin (MUCINEX) 600 MG 12 hr tablet Take 1 tablet (600 mg total) by mouth 2 (two) times daily. 30 tablet 0   ipratropium (ATROVENT) 0.02 % nebulizer solution Take 2.5 mLs (0.5 mg total) by nebulization every 6 (six) hours as needed for wheezing or shortness of breath. 75 mL 3   levalbuterol (XOPENEX) 0.63 MG/3ML nebulizer solution Take 3 mLs (0.63 mg total) by nebulization every 6 (six) hours as needed for wheezing or shortness of breath. 300 mL 3   levothyroxine (SYNTHROID, LEVOTHROID) 150 MCG tablet Take 150 mcg by mouth every evening.      meloxicam (MOBIC) 15 MG tablet Take 15 mg by mouth daily as needed for pain.     metoprolol succinate (TOPROL-XL) 50 MG 24 hr tablet Take 1 tablet (50 mg total) by mouth daily. Take with or immediately following a meal. 90 tablet 3   nitroGLYCERIN (NITROSTAT) 0.4 MG SL tablet Place 1  tablet (0.4 mg total) under the tongue every 5 (five) minutes as needed for chest pain. 25 tablet 6   OXYGEN Inhale 3 application into the lungs. 3 liters n/c     potassium chloride SA (KLOR-CON M) 20 MEQ tablet Take 1 tablet (20 mEq total) by mouth daily. 30 tablet 0   predniSONE (DELTASONE) 10 MG tablet  4 tabs for 2 days, then 3 tabs for 2 days, 2 tabs for 2 days, then 1 tab for 2 days, then stop 20 tablet 0   revefenacin (YUPELRI) 175 MCG/3ML nebulizer solution Take 3 mLs (175 mcg total) by nebulization daily. 270 mL 3   revefenacin (YUPELRI) 175 MCG/3ML nebulizer solution Take 3 mLs (175 mcg total) by nebulization daily. 90 mL 0   rosuvastatin (CRESTOR) 20 MG tablet Take 1 tablet (20 mg total) by mouth daily. 90 tablet 3   sertraline (ZOLOFT) 50 MG tablet SMARTSIG:1 Tablet(s) By Mouth Every Evening     Simethicone (GAS-X PO) Take 1 tablet by mouth daily as needed (gas relief).     Facility-Administered Medications Prior to Visit  Medication Dose Route Frequency Provider Last Rate Last Admin   methylPREDNISolone acetate (DEPO-MEDROL) injection 80 mg  80 mg Intramuscular Once Jakolby Sedivy, Karie Schwalbe, NP         Review of Systems:   Constitutional: No weight loss or gain, night sweats, fevers, chills, or lassitude. +fatigue (chronic; seems more lethargic over the last month) HEENT: No headaches, difficulty swallowing, tooth/dental problems, or sore throat. No sneezing, itching, ear ache, nasal congestion, or post nasal drip CV:  No orthopnea, chest pain, PND, lower extremity leg swelling, anasarca, dizziness, palpitations, syncope Resp: +shortness of breath with exertion (baseline). No cough. No wheeze. No hemoptysis. No chest wall deformity GI:  No heartburn, indigestion, abdominal pain, nausea, vomiting, diarrhea, change in bowel habits, loss of appetite, bloody stools.  Skin: No rash, lesions, ulcerations MSK:  No joint pain or swelling.  No decreased range of motion.  No back pain. Neuro: No  dizziness or lightheadedness.  Psych: No depression or anxiety. Mood stable.     Physical Exam:  There were no vitals taken for this visit.  GEN: Pleasant, interactive; elderly; obese ; in no acute distress. HEENT:  Normocephalic and atraumatic. PERRLA. Sclera white. Nasal turbinates pink, moist and patent bilaterally. No rhinorrhea present. Oropharynx pink and moist, without exudate or edema. No lesions, ulcerations, or postnasal drip.  NECK:  Supple w/ fair ROM. No JVD present. Normal carotid impulses w/o bruits. Thyroid symmetrical with no goiter or nodules palpated. No lymphadenopathy.   CV: RRR, no m/r/g, no peripheral edema. Pulses intact, +2 bilaterally. No cyanosis, pallor or clubbing. PULMONARY:  Unlabored, regular breathing.  Diminished bilaterally A&P w/o wheeze/rhonchi/rales. No accessory muscle use. No dullness to percussion. GI: BS present and normoactive. Soft, non-tender to palpation.  MSK: No erythema, warmth or tenderness. Cap refil <2 sec all extrem. No deformities or joint swelling noted.  Neuro: A/Ox3. No focal deficits noted.   Skin: Warm, no lesions or rashe Psych: Normal affect and behavior. Judgement and thought content appropriate.     Lab Results:  CBC    Component Value Date/Time   WBC 7.9 02/11/2022 1542   RBC 4.36 02/11/2022 1542   HGB 13.2 02/11/2022 1542   HGB 13.8 01/26/2018 0956   HCT 39.8 02/11/2022 1542   HCT 41.4 01/26/2018 0956   PLT 122.0 (L) 02/11/2022 1542   PLT 166 01/26/2018 0956   MCV 91.2 02/11/2022 1542   MCV 88 01/26/2018 0956   MCH 29.6 12/29/2021 0418   MCHC 33.1 02/11/2022 1542   RDW 13.9 02/11/2022 1542   RDW 14.1 01/26/2018 0956   LYMPHSABS 1.2 02/11/2022 1542   LYMPHSABS 2.1 12/05/2016 0957   MONOABS 0.7 02/11/2022 1542   EOSABS 0.5 02/11/2022 1542   EOSABS 0.2 12/05/2016 0957   BASOSABS  0.0 02/11/2022 1542   BASOSABS 0.0 12/05/2016 0957    BMET    Component Value Date/Time   NA 139 04/12/2022 1556   NA 140  07/07/2020 0948   K 3.4 (L) 04/12/2022 1556   CL 95 (L) 04/12/2022 1556   CO2 31 04/12/2022 1556   GLUCOSE 127 (H) 04/12/2022 1556   BUN 23 04/12/2022 1556   BUN 14 07/07/2020 0948   CREATININE 1.43 (H) 04/12/2022 1556   CALCIUM 9.9 04/12/2022 1556   GFRNONAA >60 12/29/2021 0418   GFRAA 63 07/07/2020 0948    BNP    Component Value Date/Time   BNP 93.4 12/27/2021 1426     Imaging:  No results found.        Latest Ref Rng & Units 11/17/2020   12:55 PM 05/20/2018    2:41 PM  PFT Results  FVC-Pre L 1.77  1.63   FVC-Predicted Pre % 70  60   FVC-Post L 1.79  1.71   FVC-Predicted Post % 71  63   Pre FEV1/FVC % % 47  48   Post FEV1/FCV % % 49  46   FEV1-Pre L 0.84  0.77   FEV1-Predicted Pre % 49  42   FEV1-Post L 0.87  0.79   DLCO uncorrected ml/min/mmHg 10.65    DLCO UNC% % 57    DLCO corrected ml/min/mmHg 10.65    DLCO COR %Predicted % 57    DLVA Predicted % 81      No results found for: "NITRICOXIDE"      Assessment & Plan:   No problem-specific Assessment & Plan notes found for this encounter.     I spent 25 minutes of dedicated to the care of this patient on the date of this encounter to include pre-visit review of records, face-to-face time with the patient discussing conditions above, post visit ordering of testing, clinical documentation with the electronic health record, making appropriate referrals as documented, and communicating necessary findings to members of the patients care team.  Clayton Bibles, NP 05/28/2022  Pt aware and understands NP's role.

## 2022-05-29 ENCOUNTER — Ambulatory Visit: Payer: Medicare HMO | Admitting: Nurse Practitioner

## 2022-05-31 ENCOUNTER — Ambulatory Visit: Payer: Medicare HMO | Admitting: Internal Medicine

## 2022-06-06 ENCOUNTER — Encounter: Payer: Self-pay | Admitting: Nurse Practitioner

## 2022-06-06 ENCOUNTER — Ambulatory Visit: Payer: Medicare HMO | Admitting: Nurse Practitioner

## 2022-06-06 ENCOUNTER — Ambulatory Visit (INDEPENDENT_AMBULATORY_CARE_PROVIDER_SITE_OTHER): Payer: Medicare HMO

## 2022-06-06 VITALS — BP 128/66 | HR 90 | Temp 98.3°F | Ht 65.0 in | Wt 165.0 lb

## 2022-06-06 DIAGNOSIS — J449 Chronic obstructive pulmonary disease, unspecified: Secondary | ICD-10-CM

## 2022-06-06 DIAGNOSIS — E876 Hypokalemia: Secondary | ICD-10-CM | POA: Diagnosis not present

## 2022-06-06 DIAGNOSIS — R0902 Hypoxemia: Secondary | ICD-10-CM | POA: Diagnosis not present

## 2022-06-06 DIAGNOSIS — I5032 Chronic diastolic (congestive) heart failure: Secondary | ICD-10-CM | POA: Diagnosis not present

## 2022-06-06 DIAGNOSIS — Z7189 Other specified counseling: Secondary | ICD-10-CM

## 2022-06-06 DIAGNOSIS — J441 Chronic obstructive pulmonary disease with (acute) exacerbation: Secondary | ICD-10-CM

## 2022-06-06 DIAGNOSIS — R0602 Shortness of breath: Secondary | ICD-10-CM | POA: Diagnosis not present

## 2022-06-06 DIAGNOSIS — J9611 Chronic respiratory failure with hypoxia: Secondary | ICD-10-CM | POA: Diagnosis not present

## 2022-06-06 LAB — BASIC METABOLIC PANEL
BUN: 22 mg/dL (ref 6–23)
CO2: 35 mEq/L — ABNORMAL HIGH (ref 19–32)
Calcium: 9.8 mg/dL (ref 8.4–10.5)
Chloride: 95 mEq/L — ABNORMAL LOW (ref 96–112)
Creatinine, Ser: 1.49 mg/dL (ref 0.40–1.50)
GFR: 41.14 mL/min — ABNORMAL LOW (ref >60.00)
Glucose, Bld: 108 mg/dL — ABNORMAL HIGH (ref 70–99)
Potassium: 3.4 mEq/L — ABNORMAL LOW (ref 3.5–5.1)
Sodium: 139 mEq/L (ref 135–145)

## 2022-06-06 MED ORDER — PREDNISONE 10 MG PO TABS: ORAL_TABLET | ORAL | 0 refills | Status: DC

## 2022-06-06 NOTE — Assessment & Plan Note (Signed)
His family has reported that he has been sleeping more throughout the day and not eating as much over the past several months. He has severe COPD, prone to frequent exacerbations, and CHF. Discussed palliative care referral at previous OV. They are not ready to move forward with this at this point in time. Respect their decision and advised them to notify us if they have any further questions.

## 2022-06-06 NOTE — Patient Instructions (Addendum)
Continue Breztri 2 puffs Twice daily until you receive your nebulizer treatments in the mail. Brush tongue and rinse mouth afterwards Continue Albuterol inhaler 2 puffs or levalbuterol 3 mL neb every 6 hours as needed for shortness of breath or wheezing. Notify if symptoms persist despite rescue inhaler/neb use. Continue mucinex 600 mg Twice daily  Continue lasix 20 mg daily  Continue supplemental oxygen 3 lpm POC and continuous at night for goal oxygen >88-90%  Prednisone taper. 4 tabs for 2 days, then 3 tabs for 2 days, 2 tabs for 2 days, then 1 tab for 2 days, then stop. Take in AM with food  Mucinex DM Twice daily as needed for cough/congestion  Labs today    Follow up in 2 weeks with Dr. Chase Caller or Alanson Aly. If symptoms do not improve or worsen, please contact office for sooner follow up or seek emergency care.

## 2022-06-06 NOTE — Assessment & Plan Note (Addendum)
Mild BLE edema. No evidence of volume overload on CXR. Encouraged him to restart his lasix. Creatinine elevated and potassium 3.4 at previous OV. He did not take the potassium that was prescribed. We will recheck his BMET today.

## 2022-06-06 NOTE — Progress Notes (Signed)
$'@Patient'e$  ID: [name], male    DOB: 1930-08-26, 88 y.o.   MRN: [id number]  Chief Complaint  Patient presents with   Follow-up    Pt has been having some complications with his breathing, wheezing with a hacking dry cough, no energy and falling often    Referring provider: [name], MD  HPI: 88 year old male, former smoker followed for severe COPD, chronic respiratory failure, OSA CPAP intolerant.  He is a patient of Dr. Golden Simon and last seen in office 04/12/2022 by [name].  Past medical history significant for hypertension, PVD status post left stent, infrainguinal arterial occlusive disease, CAD, chronic diastolic CHF, DM 2, HLD, history of DVT, obesity.  TEST/EVENTS:  11/17/2020 PFTs: FVC 71, FEV1 51, ratio 49, DLCO corrected for alveolar volume 57.  Moderately severe obstructive airway disease with diffusion defect.  No significant BD. 12/27/2021 CXR 2 view: Coronary artery stents are in place.  There are some chronic markings at the bases but no evidence of acute edema, infiltrate, collapse or effusion 01/08/2022 echocardiogram: EF 55 to 60%, G1 DD.  Unable to measure PASP.  No valvular dysfunction noted. 02/13/2022 CTA chest: atherosclerosis and CAD. No evidence of acute PE. There are some small peripheral bandlike densities in the segmental branches of the RLL, which could represent scarring. No LAD. There is a 1 cm calcified granuloma in the LUL along the fissure, unchanged. Small area of scarring in the lingula as well as additional bibasilar linear atelectasis/scarring, unchanged. No acute process identified.   11/01/2021: [name] with Eddie Confer Simon.  Treated for AECOPD with doxycycline course and prednisone taper.  Having some difficulty with compliance with Breztri.  Educated on the importance of consistent use.  Advised to use neb treatment in the morning and in the evening while sick.  Advised follow-up in 1 week.  12/27/2021-12/29/2021: Hospitalization for acute on chronic  respiratory failure related to COPD exacerbation.  Treated with IV steroids and neb treatments.  Discharged on prednisone taper, Atrovent nebs and changed albuterol to Xopenex.  Advised to continue Breztri twice daily.  Discharged on 3 L/min oxygen.  Stay was complicated by acute metabolic encephalopathy which had resolved upon discharge.  No evidence of acute on chronic CHF exacerbation.  02/11/2022: OV with Eddie Larrivee,Simon for worsening shortness of breath over the last few weeks.  He has also had an increase in his chronic cough, slight increase in sputum production, and wheezing as well.  His daughter reports that he gets winded even with minimal activities.  She has been having to give him his nebulizer treatments a couple times a day.  Feels like what he is not currently on is not helping.  They have also noted that he has had some increased swelling in his lower legs, left greater than right.  He does have chronic venous insufficiency and peripheral vascular disease, which he is not a candidate for open surgery or endovascular approach. Does note that it is difficult to lie flat at night.  He is on his baseline 3 L/min; they have not been routinely monitoring his oxygen levels at home.  He has been compliant with Breztri 2 puffs twice a day, which his daughter has been helping to make sure that he gets. D dimer significantly higher than normal; CTA ordered which was negative for PE. Eosinophils were elevated on CBC, advised to start OTC antihistamine.   02/25/2022: OV with Eddie Harewood Simon for follow up after being treated for AECOPD two weeks ago. He completed his doxycycline  and prednisone courses. He reports feeling significantly better. Breathing is mostly back to his baseline. He still has an occasional wheeze, but resolves quickly with his breathing treatments. He is able to perform ADLs without difficulties. He has had no increase O2 demand and is currently on 3 lpm POC. Lower extremity swelling is unchanged from  baseline. He denies cough, fevers. He continues on [name] Twice daily. Using xopenex nebs as needed.   03/29/2022: OV with Eddie Docken Simon for increased wheezing over the last week or so.  She reports that the nurse from [name] had come out last week and after listening to him, advised that they come in for an office visit because he sounded pretty wheezy.  He reports that he feels like he is breathing relatively okay.  Does not feel significantly different from his baseline; however, he has been declining for the past few months.  He does have an occasional cough which is dry.  His daughter does report that he has been sleeping a lot more during the day over the last month.  Does not seem to want to do as much as he used to.  He does still enjoy going fishing and hunting when able.  He has also had some increased swelling in his legs. Possible AECOPD vs fluid overload. Given his breathing is stable and he has BLE edema, recommended we trial short course of lasix and hold off on further prednisone. He has also not been consistent with Breztri use. We discussed the important of maintenance therapies. He does do well with neb treatments. I do believe his disease is progressing as well given with his declining activity intolerance and DOE over the past few months so we will trial change to triple therapy nebs. They are agreeable to this plan and his daughters will ensure he completes his neb therapy in the AM. We also discussed the involvement of palliative care - pt is open to this but they would like to discuss with the rest of the family. They will notify if they would like to move forward with a referral.   04/12/2022: OV with Eddie Kinlaw Simon for for follow up. Last time, we treated him for concern for fluid overload with lasix of 40 mg for 5 days. They ended up giving him 20 mg instead, which he has been taking daily since. He had great response to it and feels like his breathing is better than it has been in a long time and  wheezing has resolved. His legs are no longer swollen and he is down about 10 pounds on his weight. We had also planned to switch him to triple therapy nebs; however, these were not affordable to they have kept him on the [place]. They do not have any concerns or complaints today. Oxygen is stable on his baseline of 3 lpm. BMET with increased creatinine and hypoK (3.4). Rx potassium 20 mEq daily while on lasix. Recommended recheck in one week. Follow up with PCP.  06/06/2022: Today - acute Patient presents today with his daughter for acute visit. She helps to provide his history. She reports that over the past few weeks, he has been coughing a lot more and she's noticed more wheezing. She feels like he's getting a little more winded with activity than his usual. Cough is non-productive. No fevers, chills, hemoptysis. Over the past few months, he has been sleeping more throughout the day. He also hasn't been eating as well. We discussed palliative care referral at previous OV but  his daughters would prefer to hold off on this right now. Because of him sleeping so much, he hasn't been taking his fluid pill very consistently. Leg swelling is slightly increased, but not as severe as they were in June. No orthopnea, palpitations or calf pain. He continues on Arp Twice daily. Rare uses his rescue. He's on 3 lpm supplemental O2. They haven't been checking his oxygen at home.   Allergies  Allergen Reactions   Clopidogrel Itching    Immunization History  Administered Date(s) Administered   Fluad Quad(high Dose 65+) 08/09/2021   Influenza Split 08/01/2009, 08/16/2010, 07/14/2013   Influenza, High Dose Seasonal PF 06/19/2016, 07/07/2017, 11/05/2018, 08/11/2019   Influenza,inj,quad, With Preservative 07/04/2014   Influenza-Unspecified 09/24/2011, 07/01/2012, 08/20/2013   PFIZER(Purple Top)SARS-COV-2 Vaccination 12/27/2019, 01/19/2020, 02/23/2021   Pneumococcal Conjugate-13 08/20/2013   Pneumococcal  Polysaccharide-23 01/13/1999   Pneumococcal-Unspecified 08/14/2013   Td 01/13/1999   Tdap 12/24/2011, 01/12/2019   Zoster, Live 10/23/2009    Past Medical History:  Diagnosis Date   Anxiety    Asthma    Chronic diastolic CHF (congestive heart failure) (HCC)    diastolic    COPD (chronic obstructive pulmonary disease) ([place])    Coronary artery disease    s/p PCI of RCA 03/2009 at which time there was 70% ramus and 90% RCA, cath 01/2011 showed patent stents in RCA with moderate prox disesae, aneurysmal left circ and small 90% ramus s/p PCI, patent LAD EF 55%   Diabetes mellitus    Diverticulitis Oct. 2013   bleeding in the past and Effient for his CAD was stopped   DVT (deep venous thrombosis) (HCC)    GERD (gastroesophageal reflux disease)    Hypercholesterolemia    Hypertension    Hypothyroidism    Obesity (BMI 30-39.9)    OSA (obstructive sleep apnea)    severe on CPAP   Peripheral vascular disease ([place]) 11/2006   s/p left stent   Shortness of breath    Sleep apnea    severe OSA awaiting CPAP titration    Tobacco History: Social History   Tobacco Use  Smoking Status Former   Packs/day: 0.50   Years: 15.00   Total pack years: 7.50   Types: Cigarettes, Cigars   Quit date: 10/15/1963   Years since quitting: 58.6  Smokeless Tobacco Never   Counseling given: Not Answered   Outpatient Medications Prior to Visit  Medication Sig Dispense Refill   albuterol (PROVENTIL HFA;VENTOLIN HFA) 108 (90 BASE) MCG/ACT inhaler Inhale 2 puffs into the lungs every 6 (six) hours as needed for wheezing or shortness of breath.      aspirin EC 81 MG tablet Take 81 mg by mouth daily.     Budeson-Glycopyrrol-Formoterol (BREZTRI AEROSPHERE) 160-9-4.8 MCG/ACT AERO Inhale 2 puffs into the lungs in the morning and at bedtime.     budesonide (PULMICORT) 0.5 MG/2ML nebulizer solution Take 2 mLs (0.5 mg total) by nebulization 2 (two) times daily. 360 mL 3   ezetimibe (ZETIA) 10 MG tablet Take 1 tablet  (10 mg total) by mouth daily. 90 tablet 3   formoterol (PERFOROMIST) 20 MCG/2ML nebulizer solution Take 2 mLs (20 mcg total) by nebulization 2 (two) times daily. 360 mL 3   glimepiride (AMARYL) 2 MG tablet Take 2 mg by mouth every evening.      ipratropium (ATROVENT) 0.02 % nebulizer solution Take 2.5 mLs (0.5 mg total) by nebulization every 6 (six) hours as needed for wheezing or shortness of breath. 75 mL 3  levalbuterol (XOPENEX) 0.63 MG/3ML nebulizer solution Take 3 mLs (0.63 mg total) by nebulization every 6 (six) hours as needed for wheezing or shortness of breath. 300 mL 3   levothyroxine (SYNTHROID, LEVOTHROID) 150 MCG tablet Take 150 mcg by mouth every evening.      metoprolol succinate (TOPROL-XL) 50 MG 24 hr tablet Take 1 tablet (50 mg total) by mouth daily. Take with or immediately following a meal. 90 tablet 3   nitroGLYCERIN (NITROSTAT) 0.4 MG SL tablet Place 1 tablet (0.4 mg total) under the tongue every 5 (five) minutes as needed for chest pain. 25 tablet 6   OXYGEN Inhale 3 application into the lungs. 3 liters n/c     potassium chloride SA (KLOR-CON M) 20 MEQ tablet Take 1 tablet (20 mEq total) by mouth daily. 30 tablet 0   rosuvastatin (CRESTOR) 20 MG tablet Take 1 tablet (20 mg total) by mouth daily. 90 tablet 3   sertraline (ZOLOFT) 50 MG tablet SMARTSIG:1 Tablet(s) By Mouth Every Evening     meloxicam (MOBIC) 15 MG tablet Take 15 mg by mouth daily as needed for pain. (Patient not taking: Reported on 06/06/2022)     guaiFENesin (MUCINEX) 600 MG 12 hr tablet Take 1 tablet (600 mg total) by mouth 2 (two) times daily. 30 tablet 0   predniSONE (DELTASONE) 10 MG tablet 4 tabs for 2 days, then 3 tabs for 2 days, 2 tabs for 2 days, then 1 tab for 2 days, then stop 20 tablet 0   revefenacin (YUPELRI) 175 MCG/3ML nebulizer solution Take 3 mLs (175 mcg total) by nebulization daily. 270 mL 3   revefenacin (YUPELRI) 175 MCG/3ML nebulizer solution Take 3 mLs (175 mcg total) by nebulization  daily. 90 mL 0   Simethicone (GAS-X PO) Take 1 tablet by mouth daily as needed (gas relief).     methylPREDNISolone acetate (DEPO-MEDROL) injection 80 mg      No facility-administered medications prior to visit.     Review of Systems:   Constitutional: No weight loss or gain, night sweats, fevers, chills, or lassitude. +fatigue (chronic; seems more lethargic over the last month) HEENT: No headaches, difficulty swallowing, tooth/dental problems, or sore throat. No sneezing, itching, ear ache, nasal congestion, or post nasal drip CV:  No orthopnea, chest pain, PND, lower extremity leg swelling, anasarca, dizziness, palpitations, syncope Resp: +shortness of breath with exertion (baseline). No cough. No wheeze. No hemoptysis. No chest wall deformity GI:  No heartburn, indigestion, abdominal pain, nausea, vomiting, diarrhea, change in bowel habits, loss of appetite, bloody stools.  Skin: No rash, lesions, ulcerations MSK:  No joint pain or swelling.  No decreased range of motion.  No back pain. Neuro: No dizziness or lightheadedness.  Psych: No depression or anxiety. Mood stable.     Physical Exam:  BP 128/66 (BP Location: Left Arm, Patient Position: Sitting, Cuff Size: Normal)   Pulse 90   Temp 98.3 F (36.8 C) (Oral)   Ht '5\' 5"'$  (1.651 m)   Wt 165 lb (74.8 kg)   SpO2 91%   BMI 27.46 kg/m   GEN: Pleasant, interactive; elderly; obese ; in no acute distress. HEENT:  Normocephalic and atraumatic. PERRLA. Sclera white. Nasal turbinates pink, moist and patent bilaterally. No rhinorrhea present. Oropharynx pink and moist, without exudate or edema. No lesions, ulcerations, or postnasal drip.  NECK:  Supple w/ fair ROM. No JVD present. Normal carotid impulses w/o bruits. Thyroid symmetrical with no goiter or nodules palpated. No lymphadenopathy.   CV: RRR,  no m/r/g, +1 BLE edema. Pulses intact, +2 bilaterally. No cyanosis, pallor or clubbing. PULMONARY:  Unlabored, regular breathing.   Diminished with minimal expiratory wheezes A&P. No accessory muscle use. No dullness to percussion. GI: BS present and normoactive. Soft, non-tender to palpation.  MSK: No erythema, warmth or tenderness. Cap refil <2 sec all extrem. No deformities or joint swelling noted.  Neuro: A/Ox3. No focal deficits noted.   Skin: Warm, no lesions or rashe Psych: Normal affect and behavior. Judgement and thought content appropriate.     Lab Results:  CBC    Component Value Date/Time   WBC 7.9 02/11/2022 1542   RBC 4.36 02/11/2022 1542   HGB 13.2 02/11/2022 1542   HGB 13.8 01/26/2018 0956   HCT 39.8 02/11/2022 1542   HCT 41.4 01/26/2018 0956   PLT 122.0 (L) 02/11/2022 1542   PLT 166 01/26/2018 0956   MCV 91.2 02/11/2022 1542   MCV 88 01/26/2018 0956   MCH 29.6 12/29/2021 0418   MCHC 33.1 02/11/2022 1542   RDW 13.9 02/11/2022 1542   RDW 14.1 01/26/2018 0956   LYMPHSABS 1.2 02/11/2022 1542   LYMPHSABS 2.1 12/05/2016 0957   MONOABS 0.7 02/11/2022 1542   EOSABS 0.5 02/11/2022 1542   EOSABS 0.2 12/05/2016 0957   BASOSABS 0.0 02/11/2022 1542   BASOSABS 0.0 12/05/2016 0957    BMET    Component Value Date/Time   NA 139 04/12/2022 1556   NA 140 07/07/2020 0948   K 3.4 (L) 04/12/2022 1556   CL 95 (L) 04/12/2022 1556   CO2 31 04/12/2022 1556   GLUCOSE 127 (H) 04/12/2022 1556   BUN 23 04/12/2022 1556   BUN 14 07/07/2020 0948   CREATININE 1.43 (H) 04/12/2022 1556   CALCIUM 9.9 04/12/2022 1556   GFRNONAA >60 12/29/2021 0418   GFRAA 63 07/07/2020 0948    BNP    Component Value Date/Time   BNP 93.4 12/27/2021 1426     Imaging:  DG Chest 2 View  Result Date: 06/06/2022 CLINICAL DATA:  Shortness of breath, hypoxia EXAM: CHEST - 2 VIEW COMPARISON:  02/11/2022 FINDINGS: Cardiac size is within normal limits. Increase in AP diameter of the chest suggests COPD. There is no focal pulmonary consolidation. There is mild peribronchial thickening. There is no pleural effusion or pneumothorax.  There is possible calcified granuloma in the left parahilar region. IMPRESSION: COPD. Bronchitis. There are no new focal infiltrates or signs of pulmonary edema. Electronically Signed   By: Elmer Picker M.D.   On: 06/06/2022 15:04          Latest Ref Rng & Units 11/17/2020   12:55 PM 05/20/2018    2:41 PM  PFT Results  FVC-Pre L 1.77  1.63   FVC-Predicted Pre % 70  60   FVC-Post L 1.79  1.71   FVC-Predicted Post % 71  63   Pre FEV1/FVC % % 47  48   Post FEV1/FCV % % 49  46   FEV1-Pre L 0.84  0.77   FEV1-Predicted Pre % 49  42   FEV1-Post L 0.87  0.79   DLCO uncorrected ml/min/mmHg 10.65    DLCO UNC% % 57    DLCO corrected ml/min/mmHg 10.65    DLCO COR %Predicted % 57    DLVA Predicted % 81      No results found for: "NITRICOXIDE"      Assessment & Plan:   COPD exacerbation (HCC) Severe COPD with high symptom burden. DOE likely multifactorial. Appears to be  in acute exacerbation. CXR was without evidence of superimposed infection or pulmonary edema. We will treat him with prednisone taper. No indication for abx therapy. Advised him to continue triple therapy with Judithann Sauger; previously attempted to transition him to triple therapy nebs but this was too costly. Continue prn albuterol.  Patient Instructions  Continue Breztri 2 puffs Twice daily until you receive your nebulizer treatments in the mail. Brush tongue and rinse mouth afterwards Continue Albuterol inhaler 2 puffs or levalbuterol 3 mL neb every 6 hours as needed for shortness of breath or wheezing. Notify if symptoms persist despite rescue inhaler/neb use. Continue mucinex 600 mg Twice daily  Continue lasix 20 mg daily  Continue supplemental oxygen 3 lpm POC and continuous at night for goal oxygen >88-90%  Prednisone taper. 4 tabs for 2 days, then 3 tabs for 2 days, 2 tabs for 2 days, then 1 tab for 2 days, then stop. Take in AM with food  Mucinex DM Twice daily as needed for cough/congestion  Labs today     Follow up in 2 weeks with Dr. Chase Caller or Alanson Aly. If symptoms do not improve or worsen, please contact office for sooner follow up or seek emergency care.    Chronic respiratory failure with hypoxia ([place]) He arrived to his visit on room air with oxygen in the car per his daughter. O2 maintained 90-91% at rest. Encouraged him to continue wearing his supplemental O2 3 lpm with activity and at night. Goal >88-90%.   Chronic diastolic CHF (congestive heart failure) (HCC) Mild BLE edema. No evidence of volume overload on CXR. Encouraged him to restart his lasix. Creatinine elevated and potassium 3.4 at previous OV. He did not take the potassium that was prescribed. We will recheck his BMET today.  Goals of care, counseling/discussion His family has reported that he has been sleeping more throughout the day and not eating as much over the past several months. He has severe COPD, prone to frequent exacerbations, and CHF. Discussed palliative care referral at previous OV. They are not ready to move forward with this at this point in time. Respect their decision and advised them to notify us if they have any further questions.    I spent 35 minutes of dedicated to the care of this patient on the date of this encounter to include pre-visit review of records, face-to-face time with the patient discussing conditions above, post visit ordering of testing, clinical documentation with the electronic health record, making appropriate referrals as documented, and communicating necessary findings to members of the patients care team.  Clayton Bibles, Simon 06/06/2022  Pt aware and understands Simon's role.

## 2022-06-06 NOTE — Assessment & Plan Note (Signed)
Severe COPD with high symptom burden. DOE likely multifactorial. Appears to be in acute exacerbation. CXR was without evidence of superimposed infection or pulmonary edema. We will treat him with prednisone taper. No indication for abx therapy. Advised him to continue triple therapy with Judithann Sauger; previously attempted to transition him to triple therapy nebs but this was too costly. Continue prn albuterol.  Patient Instructions  Continue Breztri 2 puffs Twice daily until you receive your nebulizer treatments in the mail. Brush tongue and rinse mouth afterwards Continue Albuterol inhaler 2 puffs or levalbuterol 3 mL neb every 6 hours as needed for shortness of breath or wheezing. Notify if symptoms persist despite rescue inhaler/neb use. Continue mucinex 600 mg Twice daily  Continue lasix 20 mg daily  Continue supplemental oxygen 3 lpm POC and continuous at night for goal oxygen >88-90%  Prednisone taper. 4 tabs for 2 days, then 3 tabs for 2 days, 2 tabs for 2 days, then 1 tab for 2 days, then stop. Take in AM with food  Mucinex DM Twice daily as needed for cough/congestion  Labs today    Follow up in 2 weeks with Dr. Chase Caller or Alanson Aly. If symptoms do not improve or worsen, please contact office for sooner follow up or seek emergency care.

## 2022-06-06 NOTE — Assessment & Plan Note (Signed)
He arrived to his visit on room air with oxygen in the car per his daughter. O2 maintained 90-91% at rest. Encouraged him to continue wearing his supplemental O2 3 lpm with activity and at night. Goal >88-90%.

## 2022-06-07 DIAGNOSIS — E1169 Type 2 diabetes mellitus with other specified complication: Secondary | ICD-10-CM | POA: Diagnosis not present

## 2022-06-07 DIAGNOSIS — I5030 Unspecified diastolic (congestive) heart failure: Secondary | ICD-10-CM | POA: Diagnosis not present

## 2022-06-07 DIAGNOSIS — F411 Generalized anxiety disorder: Secondary | ICD-10-CM | POA: Diagnosis not present

## 2022-06-07 DIAGNOSIS — J449 Chronic obstructive pulmonary disease, unspecified: Secondary | ICD-10-CM | POA: Diagnosis not present

## 2022-06-07 DIAGNOSIS — I110 Hypertensive heart disease with heart failure: Secondary | ICD-10-CM | POA: Diagnosis not present

## 2022-06-07 DIAGNOSIS — F0390 Unspecified dementia without behavioral disturbance: Secondary | ICD-10-CM | POA: Diagnosis not present

## 2022-06-07 DIAGNOSIS — J9691 Respiratory failure, unspecified with hypoxia: Secondary | ICD-10-CM | POA: Diagnosis not present

## 2022-06-12 NOTE — Progress Notes (Signed)
Patient's potassium was still low. Can you make sure he followed up with his PCP after our visit? They had an appt the following day and they were going to discuss his lasix use and potassium replacement. I just want to make sure this was addressed! Thanks!

## 2022-06-12 NOTE — Progress Notes (Signed)
ATC daughter's cell #, left VM to return call. ATC home #, patient was there, but unable to come to the phone, caregiver answered the phone.  Advised I left the daughter a vm on her cell #.  Will await return call from daughter.

## 2022-06-13 ENCOUNTER — Telehealth: Payer: Self-pay | Admitting: Nurse Practitioner

## 2022-06-14 NOTE — Telephone Encounter (Signed)
Recommend he have PCP recheck labs to ensure potassium stable if he is back on lasix. Thanks.

## 2022-06-14 NOTE — Telephone Encounter (Signed)
Called and spoke with [name] letting her know the info per [facility] and she verbalized understanding. Nothing further needed.

## 2022-06-14 NOTE — Telephone Encounter (Signed)
Eddie Kitchen V, NP  06/12/2022  9:45 AM EDT     Patient's potassium was still low. Can you make sure he followed up with his PCP after our visit? They had an appt the following day and they were going to discuss his lasix use and potassium replacement. I just want to make sure this was addressed! Thanks!     Called and spoke with pt's daughter [name] letting her know the results of pt's bloodwork and asked her if pt followed up with PCP. [name] stated that pt did follow up with PCP. States that they have stopped his diabetic medication as it was keeping his sugar a little too low. Asked her if pt was still on potassium and she said that he was not. [name] stated that PCP told pt to drink orange juice. Asked if pt was still on lasix and she stated that he was.  Routing this to [facility] for review. Please advise.

## 2022-06-20 ENCOUNTER — Ambulatory Visit: Payer: Medicare HMO | Admitting: Nurse Practitioner

## 2022-07-08 ENCOUNTER — Other Ambulatory Visit: Payer: Self-pay

## 2022-07-08 ENCOUNTER — Emergency Department (HOSPITAL_BASED_OUTPATIENT_CLINIC_OR_DEPARTMENT_OTHER)
Admission: EM | Admit: 2022-07-08 | Discharge: 2022-07-08 | Disposition: A | Discharge status: Home / Self Care | Payer: Medicare HMO | Attending: Emergency Medicine | Admitting: Emergency Medicine

## 2022-07-08 ENCOUNTER — Emergency Department (HOSPITAL_BASED_OUTPATIENT_CLINIC_OR_DEPARTMENT_OTHER): Payer: Medicare HMO

## 2022-07-08 ENCOUNTER — Ambulatory Visit
Admission: EM | Admit: 2022-07-08 | Discharge: 2022-07-08 | Disposition: A | Discharge status: Home / Self Care | Payer: Medicare HMO | Attending: Physician Assistant | Admitting: Physician Assistant

## 2022-07-08 ENCOUNTER — Encounter (HOSPITAL_BASED_OUTPATIENT_CLINIC_OR_DEPARTMENT_OTHER): Payer: Self-pay

## 2022-07-08 DIAGNOSIS — F0390 Unspecified dementia without behavioral disturbance: Secondary | ICD-10-CM | POA: Insufficient documentation

## 2022-07-08 DIAGNOSIS — J441 Chronic obstructive pulmonary disease with (acute) exacerbation: Secondary | ICD-10-CM | POA: Diagnosis not present

## 2022-07-08 DIAGNOSIS — Z7982 Long term (current) use of aspirin: Secondary | ICD-10-CM | POA: Diagnosis not present

## 2022-07-08 DIAGNOSIS — Z7951 Long term (current) use of inhaled steroids: Secondary | ICD-10-CM | POA: Diagnosis not present

## 2022-07-08 DIAGNOSIS — I509 Heart failure, unspecified: Secondary | ICD-10-CM | POA: Diagnosis not present

## 2022-07-08 DIAGNOSIS — R0602 Shortness of breath: Secondary | ICD-10-CM | POA: Diagnosis not present

## 2022-07-08 DIAGNOSIS — Z20822 Contact with and (suspected) exposure to covid-19: Secondary | ICD-10-CM | POA: Insufficient documentation

## 2022-07-08 DIAGNOSIS — R0600 Dyspnea, unspecified: Secondary | ICD-10-CM | POA: Diagnosis not present

## 2022-07-08 LAB — CBC WITH DIFFERENTIAL/PLATELET
Abs Immature Granulocytes: 0.01 10*3/uL (ref 0.00–0.07)
Basophils Absolute: 0.1 10*3/uL (ref 0.0–0.1)
Basophils Relative: 1 %
Eosinophils Absolute: 0.7 10*3/uL — ABNORMAL HIGH (ref 0.0–0.5)
Eosinophils Relative: 10 %
HCT: 42.2 % (ref 39.0–52.0)
Hemoglobin: 13.7 g/dL (ref 13.0–17.0)
Immature Granulocytes: 0 %
Lymphocytes Relative: 26 %
Lymphs Abs: 1.9 10*3/uL (ref 0.7–4.0)
MCH: 30.1 pg (ref 26.0–34.0)
MCHC: 32.5 g/dL (ref 30.0–36.0)
MCV: 92.7 fL (ref 80.0–100.0)
Monocytes Absolute: 0.8 10*3/uL (ref 0.1–1.0)
Monocytes Relative: 11 %
Neutro Abs: 4 10*3/uL (ref 1.7–7.7)
Neutrophils Relative %: 52 %
Platelets: 157 10*3/uL (ref 150–400)
RBC: 4.55 MIL/uL (ref 4.22–5.81)
RDW: 12.6 % (ref 11.5–15.5)
WBC: 7.5 10*3/uL (ref 4.0–10.5)
nRBC: 0 % (ref 0.0–0.2)

## 2022-07-08 LAB — RESP PANEL BY RT-PCR (FLU A&B, COVID) ARPGX2
Influenza A by PCR: NEGATIVE
Influenza B by PCR: NEGATIVE
SARS Coronavirus 2 by RT PCR: NEGATIVE

## 2022-07-08 LAB — BASIC METABOLIC PANEL
Anion gap: 7 (ref 5–15)
BUN: 12 mg/dL (ref 8–23)
CO2: 32 mmol/L (ref 22–32)
Calcium: 9.6 mg/dL (ref 8.9–10.3)
Chloride: 97 mmol/L — ABNORMAL LOW (ref 98–111)
Creatinine, Ser: 1 mg/dL (ref 0.61–1.24)
GFR, Estimated: >60 mL/min (ref >60)
Glucose, Bld: 99 mg/dL (ref 70–99)
Potassium: 3.8 mmol/L (ref 3.5–5.1)
Sodium: 136 mmol/L (ref 135–145)

## 2022-07-08 LAB — TROPONIN I (HIGH SENSITIVITY)
Troponin I (High Sensitivity): 3 ng/L (ref <18)
Troponin I (High Sensitivity): 3 ng/L (ref <18)

## 2022-07-08 LAB — BRAIN NATRIURETIC PEPTIDE: B Natriuretic Peptide: 48.9 pg/mL (ref 0.0–100.0)

## 2022-07-08 MED ORDER — METHYLPREDNISOLONE SODIUM SUCC 125 MG IJ SOLR
125.0000 mg | Freq: Once | INTRAMUSCULAR | Status: AC
  Administered 2022-07-08: 125 mg via INTRAVENOUS
  Filled 2022-07-08: qty 2

## 2022-07-08 MED ORDER — PREDNISONE 20 MG PO TABS: 40.0000 mg | ORAL_TABLET | Freq: Every day | ORAL | 0 refills | Status: AC

## 2022-07-08 MED ORDER — IPRATROPIUM-ALBUTEROL 0.5-2.5 (3) MG/3ML IN SOLN
3.0000 mL | RESPIRATORY_TRACT | Status: DC
  Administered 2022-07-08: 3 mL via RESPIRATORY_TRACT
  Filled 2022-07-08: qty 3

## 2022-07-08 NOTE — ED Notes (Signed)
Patient is being discharged from the Urgent Care and sent to the Emergency Department via self. Per Wells Guiles, patient is in need of higher level of care due to worsening SOB w/ comorbidities. Patient is aware and verbalizes understanding of plan of care.  Vitals:   07/08/22 1001  BP: 124/69  Pulse: 95  Resp: 16  Temp: 98.3 F (36.8 C)  SpO2: 95%

## 2022-07-08 NOTE — ED Triage Notes (Signed)
Pt guests c/o pt having SOB, falling more frequently at home (going weak on left side), stating worsening dyspnea with exertion just walking down hall at home. Decreased appetite. More frequent bowl movements. Denies pain.

## 2022-07-08 NOTE — ED Notes (Signed)
BS: exp wheezing with rhonchi, congested cough noted bilateraly POX 94% on 3lpm via [place] HR before HHN 80/min Pt has good effort with HHN tx

## 2022-07-08 NOTE — Discharge Instructions (Signed)
  [facility] [place]  [address] [place], [place] [address] 

## 2022-07-08 NOTE — ED Notes (Signed)
Pt was assisted to bedside to use urinal. While pt was standing at bedside, pts o2 decreased to 85% on 3L [place]. MD Tegeler made aware.

## 2022-07-08 NOTE — ED Triage Notes (Signed)
C/o dyspnea, worse with exertion, more frequent falls. On 3L home O2 via [place]. Normally wears PRN, however been wearing daily for the past few weeks. Stage 3 COPD.

## 2022-07-08 NOTE — Discharge Instructions (Signed)
It was a pleasure taking care of you today!  Your labs, imaging, and EKG were unremarkable today.  Your COVID and flu swabs were negative today in the emergency department.  Your chest x-ray did not show concerns for pneumonia at this time.  You were treated today in the emergency department with a breathing treatment as well as a steroid (Solu-Medrol).  You will be sent with a prescription for prednisone, take as prescribed.  You may continue taking your prescription medications as prescribed.  It is important that you contact your pulmonologist for close follow-up this week regarding today's ED visit.  It is important that you follow up with your primary care provider regarding todays ED visit. Return to the ED if you are experiencing increasing/worsening trouble breathing, chest pain, or worsening symptoms.

## 2022-07-08 NOTE — ED Provider Notes (Signed)
[facility]    CSN: [id number] Arrival date & time: 07/08/22  0956      History   Chief Complaint Chief Complaint  Patient presents with   Shortness of Breath    HPI [name] is a 88 y.o. male.   Patient here today with daughter for evaluation of chest discomfort, increased shortness of breath he has had for the last 2 weeks. He reports he has also been falling more frequently. He has not had any pain. He reports more dyspnea on exertion. He does not report fever. He has had decreased appetite and more frequent BMs  The history is provided by the patient.  Shortness of Breath Associated symptoms: no abdominal pain, no chest pain, no cough, no fever and no vomiting     Past Medical History:  Diagnosis Date   Anxiety    Asthma    Chronic diastolic CHF (congestive heart failure) (HCC)    diastolic    COPD (chronic obstructive pulmonary disease) (HCC)    Coronary artery disease    s/p PCI of RCA 03/2009 at which time there was 70% ramus and 90% RCA, cath 01/2011 showed patent stents in RCA with moderate prox disesae, aneurysmal left circ and small 90% ramus s/p PCI, patent LAD EF 55%   Diabetes mellitus    Diverticulitis Oct. 2013   bleeding in the past and Effient for his CAD was stopped   DVT (deep venous thrombosis) (HCC)    GERD (gastroesophageal reflux disease)    Hypercholesterolemia    Hypertension    Hypothyroidism    Obesity (BMI 30-39.9)    OSA (obstructive sleep apnea)    severe on CPAP   Peripheral vascular disease ([place]) 11/2006   s/p left stent   Shortness of breath    Sleep apnea    severe OSA awaiting CPAP titration    Patient Active Problem List   Diagnosis Date Noted   Goals of care, counseling/discussion 06/06/2022   Allergic rhinitis 83/15/1761   Acute metabolic encephalopathy 60/73/7106   Chronic respiratory failure with hypoxia ([place]) 06/11/2021   Sepsis ([place]) 04/01/2019   Acute lower GI bleeding 09/03/2018   Diabetes  mellitus type II, non insulin dependent ([place]) 09/03/2018   Hyperkalemia 09/03/2018   Hypertensive urgency    COPD (chronic obstructive pulmonary disease) ([place]) 11/14/2017   Influenza A 11/13/2017   Mild renal insufficiency    LLL pneumonia 05/22/2017   History of DVT of lower extremity 05/22/2017   COPD exacerbation ([place]) 09/22/2016   SIRS (systemic inflammatory response syndrome) ([place]) 09/22/2016   Dyspnea 11/16/2014   Hypoxia 11/16/2014   Aftercare following surgery of the circulatory system 08/03/2014   Chronic diastolic CHF (congestive heart failure) ([place]) 06/21/2014   Edema of extremities 06/21/2014   COPD GOLD III 12/27/2013   Coronary artery disease    OSA (obstructive sleep apnea)    Obesity (BMI 30-39.9)    Weakness of left leg 11/20/2012   Left thigh pain 11/20/2012   PVD (peripheral vascular disease) ([place]) 04/01/2012   WEIGHT GAIN, ABNORMAL 12/07/2009   Dyslipidemia 11/21/2009   HYPERTENSION, BENIGN 11/21/2009   DEEP VENOUS THROMBOPHLEBITIS, LEFT, LEG, HX OF 11/21/2009    Past Surgical History:  Procedure Laterality Date   ABDOMINAL AORTAGRAM N/A 04/20/2012   Procedure: ABDOMINAL Maxcine Ham;  Surgeon: Angelia Mould, MD;  Location: [facility] LAB;  Service: Cardiovascular;  Laterality: N/A;   ABDOMINAL AORTAGRAM N/A 12/29/2012   Procedure: ABDOMINAL Maxcine Ham;  Surgeon: Serafina Mitchell,  MD;  Location: [facility] CATH LAB;  Service: Cardiovascular;  Laterality: N/A;   ABDOMINAL AORTOGRAM W/LOWER EXTREMITY Bilateral 03/16/2021   Procedure: ABDOMINAL AORTOGRAM W/LOWER EXTREMITY;  Surgeon: Angelia Mould, MD;  Location: [facility] LAB;  Service: Cardiovascular;  Laterality: Bilateral;   BALLOON DILATION  12/26/2011   Procedure: BALLOON DILATION;  Surgeon: Garlan Fair, MD;  Location: [facility]OS[facility];  Service: Endoscopy;  Laterality: N/A;   COLONOSCOPY  08/14/2012   Procedure: COLONOSCOPY;  Surgeon: Arta Silence, MD;  Location: [facility]OS[facility];  Service: Endoscopy;   Laterality: N/A;   CORONARY STENT PLACEMENT  2010 and 2012   ESOPHAGOGASTRODUODENOSCOPY  12/26/2011   Procedure: ESOPHAGOGASTRODUODENOSCOPY (EGD);  Surgeon: Garlan Fair, MD;  Location: [facility];  Service: Endoscopy;  Laterality: N/A;   ESOPHAGOGASTRODUODENOSCOPY  08/13/2012   Procedure: ESOPHAGOGASTRODUODENOSCOPY (EGD);  Surgeon: Arta Silence, MD;  Location: [facility];  Service: Endoscopy;  Laterality: Left;   HERNIA REPAIR  1992   evntral hernia repair   LOWER EXTREMITY ANGIOGRAM Bilateral 12/29/2012   Procedure: LOWER EXTREMITY ANGIOGRAM;  Surgeon: Serafina Mitchell, MD;  Location: [facility] CATH LAB;  Service: Cardiovascular;  Laterality: Bilateral;  bilat lower extrem angio       Home Medications    Prior to Admission medications   Medication Sig Start Date End Date Taking? Authorizing Provider  albuterol (PROVENTIL HFA;VENTOLIN HFA) 108 (90 BASE) MCG/ACT inhaler Inhale 2 puffs into the lungs every 6 (six) hours as needed for wheezing or shortness of breath.     [provider]  aspirin EC 81 MG tablet Take 81 mg by mouth daily.    [provider]  Budeson-Glycopyrrol-Formoterol (BREZTRI AEROSPHERE) 160-9-4.8 MCG/ACT AERO Inhale 2 puffs into the lungs in the morning and at bedtime.    [provider]  budesonide (PULMICORT) 0.5 MG/2ML nebulizer solution Take 2 mLs (0.5 mg total) by nebulization 2 (two) times daily. 03/29/22   Cobb, Karie Schwalbe, NP  ezetimibe (ZETIA) 10 MG tablet Take 1 tablet (10 mg total) by mouth daily. 07/23/21   Sueanne Margarita, MD  formoterol (PERFOROMIST) 20 MCG/2ML nebulizer solution Take 2 mLs (20 mcg total) by nebulization 2 (two) times daily. 03/29/22   Cobb, Karie Schwalbe, NP  glimepiride (AMARYL) 2 MG tablet Take 2 mg by mouth every evening.     [provider]  ipratropium (ATROVENT) 0.02 % nebulizer solution Take 2.5 mLs (0.5 mg total) by nebulization every 6 (six) hours as needed for wheezing or shortness of breath.  12/29/21   Oswald Hillock, MD  levalbuterol Penne Lash) 0.63 MG/3ML nebulizer solution Take 3 mLs (0.63 mg total) by nebulization every 6 (six) hours as needed for wheezing or shortness of breath. 12/29/21   Oswald Hillock, MD  levothyroxine (SYNTHROID, LEVOTHROID) 150 MCG tablet Take 150 mcg by mouth every evening.  05/19/17   [provider]  meloxicam (MOBIC) 15 MG tablet Take 15 mg by mouth daily as needed for pain. Patient not taking: Reported on 06/06/2022 03/30/19   [provider]  metoprolol succinate (TOPROL-XL) 50 MG 24 hr tablet Take 1 tablet (50 mg total) by mouth daily. Take with or immediately following a meal. 07/02/21   Turner, Eber Hong, MD  nitroGLYCERIN (NITROSTAT) 0.4 MG SL tablet Place 1 tablet (0.4 mg total) under the tongue every 5 (five) minutes as needed for chest pain. 07/06/20   Sueanne Margarita, MD  OXYGEN Inhale 3 application into the lungs. 3 liters n/c    [provider]  potassium chloride SA (KLOR-CON M) 20 MEQ tablet Take 1 tablet (20 mEq total) by mouth daily. 04/17/22   Cobb, Karie Schwalbe, NP  predniSONE (DELTASONE) 10 MG tablet 4 tabs for 2 days, then 3 tabs for 2 days, 2 tabs for 2 days, then 1 tab for 2 days, then stop 06/06/22   Cobb, Karie Schwalbe, NP  rosuvastatin (CRESTOR) 20 MG tablet Take 1 tablet (20 mg total) by mouth daily. 07/02/21   Sueanne Margarita, MD  sertraline (ZOLOFT) 50 MG tablet SMARTSIG:1 Tablet(s) By Mouth Every Evening 01/30/22   [provider]    Family History Family History  Problem Relation Age of Onset   Diabetes Mother    Hyperlipidemia Mother    Hypertension Mother    Heart attack Mother    Heart disease Mother        before age 75   Diabetes Brother    Heart disease Father        before age 33   Breast cancer Sister    Diabetes Sister    Hyperlipidemia Sister    Hypertension Sister    Heart attack Sister    Hyperlipidemia Sister    Hypertension Sister    Heart disease Sister        Heart Disease  before age 58   Asthma Sister    Malignant hyperthermia Neg Hx     Social History Social History   Tobacco Use   Smoking status: Former    Packs/day: 0.50    Years: 15.00    Total pack years: 7.50    Types: Cigarettes, Cigars    Quit date: 10/15/1963    Years since quitting: 58.7   Smokeless tobacco: Never  Vaping Use   Vaping Use: Never used  Substance Use Topics   Alcohol use: No   Drug use: No     Allergies   Clopidogrel   Review of Systems Review of Systems  Constitutional:  Positive for appetite change. Negative for chills and fever.  HENT:  Negative for congestion and rhinorrhea.   Eyes:  Negative for discharge and redness.  Respiratory:  Positive for shortness of breath. Negative for cough.   Cardiovascular:  Negative for chest pain.  Gastrointestinal:  Positive for diarrhea. Negative for abdominal pain and vomiting.     Physical Exam Triage Vital Signs ED Triage Vitals  Enc Vitals Group     BP 07/08/22 1001 124/69     Pulse Rate 07/08/22 1001 95     Resp 07/08/22 1001 16     Temp 07/08/22 1001 98.3 F (36.8 C)     Temp Source 07/08/22 1001 Oral     SpO2 07/08/22 1001 95 %     Weight --      Height --      Head Circumference --      Peak Flow --      Pain Score 07/08/22 1002 0     Pain Loc --      Pain Edu? --      Excl. in [place]? --    No data found.  Updated Vital Signs BP 124/69 (BP Location: Right Arm)   Pulse 95   Temp 98.3 F (36.8 C) (Oral)   Resp 16   SpO2 95%      Physical Exam Vitals and nursing note reviewed.  Constitutional:      General: He is not in acute distress.    Appearance: Normal appearance. He is not ill-appearing.  HENT:  Head: Normocephalic and atraumatic.  Eyes:     Conjunctiva/sclera: Conjunctivae normal.  Cardiovascular:     Rate and Rhythm: Normal rate and regular rhythm.  Pulmonary:     Effort: Pulmonary effort is normal.     Breath sounds: Wheezing (moderate, diffuse) present.     Comments: Wearing  o2 via nasal cannula Neurological:     Mental Status: He is alert.  Psychiatric:        Mood and Affect: Mood normal.        Behavior: Behavior normal.      UC Treatments / Results  Labs (all labs ordered are listed, but only abnormal results are displayed) Labs Reviewed - No data to display  EKG   Radiology No results found.  Procedures Procedures (including critical care time)  Medications Ordered in UC Medications - No data to display  Initial Impression / Assessment and Plan / UC Course  I have reviewed the triage vital signs and the nursing notes.  Pertinent labs & imaging results that were available during my care of the patient were reviewed by me and considered in my medical decision making (see chart for details).    EKG without significant changes compared to prior from 12/2021, however given symptoms with significant PMH recommended evaluation in the ED for stat labs, etc. Patient is agreeable to same and daughter will transport via POV.   Final Clinical Impressions(s) / UC Diagnoses   Final diagnoses:  COPD exacerbation [facility])     Discharge [facility]  [place], [place] [address]    ED Prescriptions   None    PDMP not reviewed this encounter.   Francene Finders, PA-C 07/08/22 1031

## 2022-07-08 NOTE — ED Provider Notes (Signed)
[place] EMERGENCY DEPARTMENT Provider Note   CSN: [id number] Arrival date & time: 07/08/22  1055     History  Chief Complaint  Patient presents with  . Shortness of Breath    [name] is a 88 y.o. male with a past medical history of dementia, COPD, CHF, who presents to the emergency department with concerns for shortness of breath onset last night.  He is accompanied by his daughter who notes that he has had increased wheezing, sternal chest pain. Daughter notes that over the past 3 weeks patient has been wearing his 3 L via nasal cannula daily instead of as needed.  She notes that she had to increase his oxygen to 4 L last night due to his symptoms.  Daughter notes she has been given the patient his albuterol inhaler as well as breathing treatments.  Patient was also given Gas-X.  Patient has associated rhinorrhea, productive cough, sternal chest pain, wheezing.  Denies nasal congestion, fever.  Denies sick contacts.  Daughter notes that the patient has a history of 3 stents being placed.  He also has a cardiologist and pulmonologist.    The history is provided by the patient and a relative. No language interpreter was used.       Home Medications Prior to Admission medications   Medication Sig Start Date End Date Taking? Authorizing Provider  predniSONE (DELTASONE) 20 MG tablet Take 2 tablets (40 mg total) by mouth daily for 5 days. 07/08/22 07/13/22 Yes Nerissa Constantin A, PA-C  albuterol (PROVENTIL HFA;VENTOLIN HFA) 108 (90 BASE) MCG/ACT inhaler Inhale 2 puffs into the lungs every 6 (six) hours as needed for wheezing or shortness of breath.     [provider]  aspirin EC 81 MG tablet Take 81 mg by mouth daily.    [provider]  Budeson-Glycopyrrol-Formoterol (BREZTRI AEROSPHERE) 160-9-4.8 MCG/ACT AERO Inhale 2 puffs into the lungs in the morning and at bedtime.    [provider]  budesonide (PULMICORT) 0.5 MG/2ML nebulizer solution Take 2  mLs (0.5 mg total) by nebulization 2 (two) times daily. 03/29/22   Cobb, Karie Schwalbe, NP  ezetimibe (ZETIA) 10 MG tablet Take 1 tablet (10 mg total) by mouth daily. 07/23/21   Sueanne Margarita, MD  formoterol (PERFOROMIST) 20 MCG/2ML nebulizer solution Take 2 mLs (20 mcg total) by nebulization 2 (two) times daily. 03/29/22   Cobb, Karie Schwalbe, NP  glimepiride (AMARYL) 2 MG tablet Take 2 mg by mouth every evening.     [provider]  ipratropium (ATROVENT) 0.02 % nebulizer solution Take 2.5 mLs (0.5 mg total) by nebulization every 6 (six) hours as needed for wheezing or shortness of breath. 12/29/21   Oswald Hillock, MD  levalbuterol Penne Lash) 0.63 MG/3ML nebulizer solution Take 3 mLs (0.63 mg total) by nebulization every 6 (six) hours as needed for wheezing or shortness of breath. 12/29/21   Oswald Hillock, MD  levothyroxine (SYNTHROID, LEVOTHROID) 150 MCG tablet Take 150 mcg by mouth every evening.  05/19/17   [provider]  meloxicam (MOBIC) 15 MG tablet Take 15 mg by mouth daily as needed for pain. Patient not taking: Reported on 06/06/2022 03/30/19   [provider]  metoprolol succinate (TOPROL-XL) 50 MG 24 hr tablet Take 1 tablet (50 mg total) by mouth daily. Take with or immediately following a meal. 07/02/21   Turner, Eber Hong, MD  nitroGLYCERIN (NITROSTAT) 0.4 MG SL tablet Place 1 tablet (0.4 mg total) under the tongue every  5 (five) minutes as needed for chest pain. 07/06/20   Sueanne Margarita, MD  OXYGEN Inhale 3 application into the lungs. 3 liters n/c    [provider]  potassium chloride SA (KLOR-CON M) 20 MEQ tablet Take 1 tablet (20 mEq total) by mouth daily. 04/17/22   Cobb, Karie Schwalbe, NP  rosuvastatin (CRESTOR) 20 MG tablet Take 1 tablet (20 mg total) by mouth daily. 07/02/21   Sueanne Margarita, MD  sertraline (ZOLOFT) 50 MG tablet SMARTSIG:1 Tablet(s) By Mouth Every Evening 01/30/22   [provider]      Allergies    Clopidogrel    Review of  Systems   Review of Systems  HENT:  Positive for rhinorrhea. Negative for congestion.   Respiratory:  Positive for cough and shortness of breath.   Cardiovascular:  Positive for chest pain.  All other systems reviewed and are negative.   Physical Exam Updated Vital Signs BP (!) 101/58   Pulse 82   Temp 98.3 F (36.8 C) (Oral)   Resp 18   Ht '5\' 3"'$  (1.6 m)   Wt 74.8 kg   SpO2 97%   BMI 29.23 kg/m  Physical Exam Vitals and nursing note reviewed.  Constitutional:      General: He is not in acute distress.    Appearance: He is not diaphoretic.  HENT:     Head: Normocephalic and atraumatic.     Mouth/Throat:     Pharynx: No oropharyngeal exudate.  Eyes:     General: No scleral icterus.    Conjunctiva/sclera: Conjunctivae normal.  Cardiovascular:     Rate and Rhythm: Normal rate and regular rhythm.     Pulses: Normal pulses.     Heart sounds: Normal heart sounds.  Pulmonary:     Effort: Pulmonary effort is normal. No respiratory distress.     Breath sounds: Decreased air movement present. Wheezing present.     Comments: Patient with increased work of breathing noted on exam.  Also with decreased air movement.  Wheezing noted throughout lung fields. Abdominal:     General: Bowel sounds are normal.     Palpations: Abdomen is soft. There is no mass.     Tenderness: There is no abdominal tenderness. There is no guarding or rebound.  Musculoskeletal:        General: Normal range of motion.     Cervical back: Normal range of motion and neck supple.  Skin:    General: Skin is warm and dry.  Neurological:     Mental Status: He is alert.  Psychiatric:        Behavior: Behavior normal.     ED Results / Procedures / Treatments   Labs (all labs ordered are listed, but only abnormal results are displayed) Labs Reviewed  BASIC METABOLIC PANEL - Abnormal; Notable for the following components:      Result Value   Chloride 97 (*)    All other components within normal limits   CBC WITH DIFFERENTIAL/PLATELET - Abnormal; Notable for the following components:   Eosinophils Absolute 0.7 (*)    All other components within normal limits  RESP PANEL BY RT-PCR (FLU A&B, COVID) [id number]  BRAIN NATRIURETIC PEPTIDE  TROPONIN I (HIGH SENSITIVITY)  TROPONIN I (HIGH SENSITIVITY)    EKG EKG Interpretation  Date/Time:  Monday July 08 2022 11:07:08 EDT Ventricular Rate:  84 PR Interval:  182 QRS Duration: 132 QT Interval:  392 QTC Calculation: 463 R Axis:   -27 Text Interpretation:  Normal sinus rhythm Right bundle branch block Abnormal ECG When compared with ECG of 08-Jul-2022 10:12, PREVIOUS ECG IS PRESENT when compared to prior, similar appearance. NO STEMI Confirmed by Antony Blackbird [phone]) on 07/08/2022 11:41:19 AM  Radiology DG Chest 2 View  Result Date: 07/08/2022 CLINICAL DATA:  Dyspnea with exertion. EXAM: CHEST - 2 VIEW COMPARISON:  June 06, 2022. FINDINGS: The heart size and mediastinal contours are within normal limits. Stable calcified granuloma seen in left midlung. No acute pulmonary disease is noted. The visualized skeletal structures are unremarkable. IMPRESSION: No active cardiopulmonary disease. Electronically Signed   By: Marijo Conception M.D.   On: 07/08/2022 12:15    Procedures Procedures    Medications Ordered in ED Medications  ipratropium-albuterol (DUONEB) 0.5-2.5 (3) MG/3ML nebulizer solution 3 mL (3 mLs Nebulization Not Given 07/08/22 1600)  methylPREDNISolone sodium succinate (SOLU-MEDROL) 125 mg/2 mL injection 125 mg (125 mg Intravenous Given 07/08/22 1126)    ED Course/ Medical Decision Making/ A&P Clinical Course as of 07/08/22 1855  Mon Jul 08, 2022  1140 Pt O2 decreased to 91% while sitting on stretcher. [SB]  8144 Pt re-evaluated and discussed with patient lab and imaging findings.  Lungs auscultated and noted mild improvement with treatment regimen in the ED. [SB]  1427 Discussed with the patient regarding plans for admission  due to patient oxygen slightly lower.  Patient notes that he has had the symptoms typically and does not want to be admitted at this time.  Will reevaluate patient. [SB]  1519 Discussed with patient and daughter regarding plans for admission.  Daughter is agreeable with admission at this time.  Patient notes that he does not want to be admitted at this time. [SB]  1528 Attending in to evaluate patient and also agrees with admission plans.  Patient declines admission at this time.  Attending discussed with patient importance of follow-up in the emergency department if worsening symptoms.  Patient and daughter agreeable at this time. [SB]    Clinical Course User Index [SB] Shiasia Porro A, PA-C                           Medical Decision Making Amount and/or Complexity of Data Reviewed Labs: ordered. Radiology: ordered.  Risk Prescription drug management.   Patient presents to the ED complaining of shortness of breath onset last night.  Vital signs patient afebrile.  Patient typically wears 3 L via nasal cannula that was recently increased to 4 L last night by his daughter.  On exam patient with increased work of breathing noted on exam, decreased air movement, wheezing noted throughout all lung fields.  Otherwise no acute cardiovascular exam findings.  No pitting edema noted to bilateral lower extremities.  Echocardiogram on 01/08/2022 showed an LVEF of 55-60%. Differential diagnosis includes CHF exacerbation, ACS, COVID, flu, PNA, COPD exacerbation.  Additional history obtained:  Additional history obtained from Daughter/Son  Labs:  I ordered, and personally interpreted labs.  The pertinent results include:   initial and delta troponin at 3. CBC without leukocytosis. BMP unremarkable. BNP unremarkable. COVID and flu swab negative  Imaging: I ordered imaging studies including chest x-ray I independently visualized and interpreted imaging which showed: No acute findings I agree with the  radiologist interpretation  Medications:  I ordered medication including Solu-Medrol, DuoNeb for symptom management Reevaluation of the patient after these medicines and interventions, I reevaluated the patient and found that they have improved I have reviewed the patients  home medicines and have made adjustments as needed  Disposition: Presentation suspicious for acute COPD exacerbation.  Doubt ACS, pneumonia, COVID, flu.  Doubt CHF exacerbation at this time.  Thorough discussion held with patient as well as daughter multiple times regarding plans for admission.  At this time patient does not want to be admitted to the hospital.  Attending also evaluated patient and recommended admission at this time, patient again declined admission.  Patient does live at home with his daughter, feel that patient can safely be discharged home in the care of his daughter.  Daughter given return precautions.  After consideration of the diagnostic results and the patients response to treatment, I feel that the patient would benefit from Discharge home.  Patient sent with a prescription for prednisone.  Also discussed with daughter importance of having patient follow-up with his pulmonologist regarding today's ED visit.  Supportive care measures and strict return precautions discussed with patient at bedside. Pt acknowledges and verbalizes understanding. Pt appears safe for discharge. Follow up as indicated in discharge paperwork.    This chart was dictated using voice recognition software, Dragon. Despite the best efforts of this provider to proofread and correct errors, errors may still occur which can change documentation meaning.   Final Clinical Impression(s) / ED Diagnoses Final diagnoses:  COPD exacerbation ([place])  Shortness of breath    Rx / DC Orders ED Discharge Orders          Ordered    predniSONE (DELTASONE) 20 MG tablet  Daily        07/08/22 1618              Nyjae Hodge A,  PA-C 07/08/22 1855    Tegeler, Gwenyth Allegra, MD 07/09/22 [phone]

## 2022-07-15 ENCOUNTER — Ambulatory Visit: Payer: Medicare HMO | Admitting: Internal Medicine

## 2022-07-15 ENCOUNTER — Encounter: Payer: Self-pay | Admitting: Internal Medicine

## 2022-07-15 VITALS — BP 122/62 | HR 85 | Temp 98.3°F | Ht 63.0 in | Wt 167.4 lb

## 2022-07-15 DIAGNOSIS — F40298 Other specified phobia: Secondary | ICD-10-CM | POA: Diagnosis not present

## 2022-07-15 DIAGNOSIS — R296 Repeated falls: Secondary | ICD-10-CM | POA: Diagnosis not present

## 2022-07-15 DIAGNOSIS — D721 Eosinophilia, unspecified: Secondary | ICD-10-CM | POA: Diagnosis not present

## 2022-07-15 DIAGNOSIS — J9611 Chronic respiratory failure with hypoxia: Secondary | ICD-10-CM | POA: Diagnosis not present

## 2022-07-15 DIAGNOSIS — J449 Chronic obstructive pulmonary disease, unspecified: Secondary | ICD-10-CM | POA: Diagnosis not present

## 2022-07-15 DIAGNOSIS — J441 Chronic obstructive pulmonary disease with (acute) exacerbation: Secondary | ICD-10-CM

## 2022-07-15 DIAGNOSIS — G8194 Hemiplegia, unspecified affecting left nondominant side: Secondary | ICD-10-CM | POA: Diagnosis not present

## 2022-07-15 MED ORDER — ROFLUMILAST 250 MCG PO TABS: 1.0000 | ORAL_TABLET | Freq: Every day | ORAL | 0 refills | Status: AC

## 2022-07-15 MED ORDER — ROFLUMILAST 500 MCG PO TABS: 500.0000 ug | ORAL_TABLET | Freq: Every day | ORAL | 11 refills | Status: AC

## 2022-07-15 NOTE — Patient Instructions (Addendum)
ICD-10-CM   1. Chronic respiratory failure with hypoxia (H[facility])  J96.11     2. COPD, very severe ([place])  J44.9     3. COPD, frequent exacerbations ([place])  J44.1     4. Eosinophilia, unspecified type  D72.10     5. Needle phobia  F40.298        Multiple flareups of COPD this year 2023.  Most recent flareup was last week with prednisone concluding yesterday.  Currently stable without flareup.  Blood eosinophil 07/08/2022 significantly high suggesting a mechanism for frequent flareups  Plan - Discussed Dupixent therapy but respect your hesitancy to have any injection due to fear of needles -Start Roflumilast  - Take new tablet called roflumilast 1 tablet   - starter pack 237mg per day after food x 30 days or 1 month  - then escalate to 5068m daily after food x continue at this dose  - any side effects especially [place] Continue Breztri 2 puffs Twice daily  - Brush tongue and rinse mouth afterwards Continue Albuterol inhaler 2 puffs or levalbuterol 3 mL neb every 6 hours as needed for shortness of breath or wheezing. Continue mucinex 600 mg Twice daily  Continue lasix 20 mg daily  Continue supplemental oxygen 3 lpm POC and continuous at night for goal oxygen >88-90%   Frequent falls Left-sided weakness intermittently  Plan - If this happens again and is sustained for more than a few minutes call 911 -Refer to neurology -Discussed with primary care physician   Follow-up - Return in 4-8 weeks to see nurse practitioner or Dr. RaChase Calleror a quick follow-up  -If above strategy not working we will revisit [place]

## 2022-07-15 NOTE — Progress Notes (Signed)
HPI  IOV 05/01/2018  Chief Complaint  Patient presents with   Consult    Referred by Dr. Radford Pax due to SOB and COPD after diagnosis x2 years ago. Pt is a former MW pt last seen 12/27/13. Pt has c/o SOB that happens at anytime especially if he tries to rush and states he has an occ. CP. Denies any cough.    [name] , 88 y.o. , with dob May 12, 1930 and male ,Not Hispanic or Latino from [address]  [place] [address] - presents to lung clinic for dyspnea, copd eval. He is 88 years old and referred by Dr. Radford Pax Hisdaughter is here with him. He tells me that he's had insidious onset of shortness of breath for the last several years. He is unable to quantify exactly. Nevertheless in the last few years is progressively worse despite being on Symbicort for the last few years. He has associated sleep apnea and is compliant with CPAP as managed by Dr. Radford Pax. He does have a mild cough but no sputum production. He has obesity but this has been stable. He denies much of smoking but this clear-cut emphysema on visualization of his images and on spirometry 4 years ago. His most recent hemoglobin appears okay.       Spirometry in March 2015 and personally visualized image shows FEV1 0.97 L/45% with a ratio of 50% insistent with Gold stage III obstruction   Imaging that I personally visualized includes April 2019 chest x-ray that shows hyperinflation with possible bullous disease in the lung base. He has CT angiogram of the chest in February 2016 that ruled out pulmonary embolism. I personally visualized this and did not see any mass. He does have emphysema. Pulmonary embolism was ruled out.     Results for AJAX, SCHROLL (MRN [id number]) as of 05/01/2018 09:14  Ref. Range 01/26/2018 09:56  Hemoglobin Latest Ref Range: 13.0 - 17.7 g/dL 13.8  Results for PERFECTO, PURDY (MRN [id number]) as of 05/01/2018 09:14  Ref. Range 01/26/2018 09:56  Creatinine Latest Ref Range: 0.76 - 1.27 mg/dL  1.02    [name] Aug [birth year]  88 year old male, former smoker (quit >50 yeas ago). PMH obesity, emphysema, OSA on CPAP. Patient of Dr. Chase Caller, last seen on 7/19.  Referred by Dr. Heron Nay for dyspnea and COPD eval. Reports progression of sob over the last several years despite being on symbicort. Mild cough with no sputum production. Evidence of emphysema on imaging and spirometry. CAT score 25. Spirometry 12/2013- FEV1 0.97, ratio 50% consistent with COPD GOLD III obstruction. Dr. Chase Caller felt dyspnea likely d/t obesity, physical deconditioning and diastolic dysfunction associated COPD. Plan repeat full PFTs to re-stage COPD, HRCT and alpha 1 antitypsin.   Tests: >>HRCT 05/11/18- IMPRESSION: No findings to suggest interstitial lung disease. Mild diffuse bronchial wall thickening with mild centrilobular and paraseptal emphysema; imaging findings compatible with the reported clinical history of COPD. >>PFTs 05/20/18- FVC 1.63 (60%), FEV1  0.77 (42%), Ratio 48 (65%)   Presents today for follow up dyspnea with his wife. Reports progressive worsening of breathing over the past few years. His main goal is to be able to fish. Continues Symbicort as prescribed. Wearing CPAP every night. Denies cough or wheeze.  Patient had pulmonary function test today showing obstruction, no bronchodilator response. Unable to complete DLCO or lung volumes d/t patient not being able to perform the proper technique. HRCT completed showing no evidence of ILD.    [name] 09/20/2020  Subjective:  Patient ID: [name], male , DOB: 1930-02-21 , age 30 y.o. , MRN: [id number] , ADDRESS: [address] [place] [address] PCP [name], MD Patient Care Team: [name], MD as PCP - General (Family Medicine) Sueanne Margarita, MD as PCP - Cardiology (Cardiology) Sueanne Margarita, MD as Attending Physician (Cardiology)  This Provider for this visit: Treatment Team:  Attending Provider: Brand Males,  MD    09/20/2020 -   Chief Complaint  Patient presents with   Follow-up    Pt last seen 05/20/18. Pt has been having problems with fluid in lungs and also has had increased SOB.  Pt does now have oxygen provided by [employer] that he wears 2L as needed.     HPI ADVAIT Simon 88 y.o. -presents for routine follow-up.  He presents with his daughter.  He has 5 daughters no sons.  One of the daughters is here.  I last saw him 2 and half years ago and at that time got pulmonary function test that showed COPD.  He is followed up with nurse practitioner.  He got started on inhaler therapy.  After that because of the pandemic lost to follow-up.  Daughter tells me he has progressive worsening of shortness of breath.  He gets short of breath just walking from room to room.  Nevertheless COPD CAT score appears the same.  They are more concerned about progressive edema he now has anasarca with chronic lymphedema in the feet.  This is despite diuretics.  Therefore they have been referred to pulmonary to reestablish.  He has a history of diastolic heart failure with the last echocardiogram was in 2017.  He has a history of sleep apnea with some mild hypercarbia as documented below but he does not use a CPAP on a compliant fashion.  He admits that is easily.  His last CT scan of the chest was a few years ago did not show any ILD.  He is recently been given oxygen by his primary care physician.  Inhalers have been changed around.  Primary care physician notes reviewed.  His most recent hemoglobin in April 2021 was normal at 14.5 g%.  Recent kidney function is stable.  He is quite functional.  He does not use a cane.  He is cognitively intact.     CAT Score 09/20/2020 05/01/2018  Total CAT Score 24 25    Results for CAROLOS, Simon (MRN [id number]) as of 09/20/2020 10:51  Ref. Range 07/07/2020 09:48  Creatinine Latest Ref Range: 0.76 - 1.27 mg/dL 1.18   Results for [name] (MRN [id number]) as of 09/20/2020  10:51  Ref. Range 09/06/2018 05:43 04/01/2019 10:25 04/01/2019 13:03 07/14/2019 11:08 07/07/2020 09:48  Hemoglobin Latest Ref Range: 13.0 - 17.0 g/dL 12.7 (L) 15.7       PFT Results Latest Ref Rng & Units 05/20/2018  FVC-Pre L 1.63  FVC-Predicted Pre % 60  FVC-Post L 1.71  FVC-Predicted Post % 63  Pre FEV1/FVC % % 48  Post FEV1/FCV % % 46  FEV1-Pre L 0.77  FEV1-Predicted Pre % 42  FEV1-Post L 0.79   IMPRESSION: 1. No findings to suggest interstitial lung disease. 2. Mild diffuse bronchial wall thickening with mild centrilobular and paraseptal emphysema; imaging findings compatible with the reported clinical history of COPD. 3. Aortic atherosclerosis, in addition to left main and 3 vessel coronary artery disease. 4. There are calcifications of the aortic valve. Echocardiographic correlation for evaluation of potential valvular  dysfunction may be warranted if clinically indicated.   Aortic Atherosclerosis (ICD10-I70.0) and Emphysema (ICD10-J43.9).     Electronically Signed   By: Vinnie Langton M.D.   On: 05/11/2018 14:   ROS - per HPI  ECHO dec 2017   -------------------------------------------------------------------  Study Conclusions   - Left ventricle: The cavity size was normal. Wall thickness was    increased in a pattern of mild LVH. Systolic function was normal.    The estimated ejection fraction was in the range of 60% to 65%.    Wall motion was normal; there were no regional wall motion    abnormalities. Doppler parameters are consistent with abnormal    left ventricular relaxation (grade 1 diastolic dysfunction). The    E/e&' ratio is between 8-15, suggesting indeterminate LV filling    pressure.  - Aortic valve: Poorly visualized. Sclerosis without stenosis.  - Left atrium: The atrium was normal in size.  - Tricuspid valve: There was trivial regurgitation.  - Pulmonary arteries: PA peak pressure: 30 mm Hg (S).  - Inferior vena cava: The vessel was normal  in size. The    respirophasic diameter changes were in the normal range (>= 50%),    consistent with normal central venous pressure.   Impressions:   xxxxxxxxxxxxxxxx Results for DEAVEN, URWIN (MRN [id number]) as of 09/20/2020 10:51  Ref. Range 11/16/2014 22:55  Sample type Unknown ARTERIAL  pH, Arterial Latest Ref Range: 7.35 - 7.45  7.352  pCO2 arterial Latest Ref Range: 35 - 45 mmHg 52.4 (H)  pO2, Arterial Latest Ref Range: 80 - 100 mmHg 67.0 (L)    [name] 11/17/2020  Subjective:  Patient ID: [name], male , DOB: Jul 16, 1930 , age 5 y.o. , MRN: [id number] , ADDRESS: [address] [place] [address] PCP [name], MD Patient Care Team: [name], MD as PCP - General (Family Medicine) Sueanne Margarita, MD as PCP - Cardiology (Cardiology) Sueanne Margarita, MD as Attending Physician (Cardiology)  This Provider for this visit: Treatment Team:  Attending Provider: Brand Males, MD    HPI [name] 88 y.o. -returns for follow-up.  At this time he is accompanied by his daughter Vanita Ingles.  The other daughter [name] has had back surgery and could not attend.  Vanita Ingles does not live with him.  He tells me that he is stable.  He could not have a CT scan of the chest because he could not lie flat.  His pedal edema continues.  There is the only thing that he want symptom relief from.  He has sleep apnea but is not using his CPAP.  He refuses to use a CPAP.  He says his quality of life is fine without the CPAP.  He does not want a blood gas test done to see if he would qualify for BiPAP.  He had echocardiogram that shows shows diastolic dysfunction but is otherwise normal.  His pulmonary function test shows stability with gold stage III COPD.  He does not use oxygen at daytime or with exertion or at night.  He just is using his triple inhaler therapy.  He prefers basic symptom based approach care  11/01/2021: Henderson Cloud NP.  Treated for AECOPD with doxycycline course and prednisone  taper.  Having some difficulty with compliance with Breztri.  Educated on the importance of consistent use.  Advised to use neb treatment in the morning and in the evening while sick.  Advised follow-up in 1 week.  12/27/2021-12/29/2021:  Hospitalization for acute on chronic respiratory failure related to COPD exacerbation.  Treated with IV steroids and neb treatments.  Discharged on prednisone taper, Atrovent nebs and changed albuterol to Xopenex.  Advised to continue Breztri twice daily.  Discharged on 3 L/min oxygen.  Stay was complicated by acute metabolic encephalopathy which had resolved upon discharge.  No evidence of acute on chronic CHF exacerbation.  02/11/2022: [name] with Cobb,NP for worsening shortness of breath over the last few weeks.  He has also had an increase in his chronic cough, slight increase in sputum production, and wheezing as well.  His daughter reports that he gets winded even with minimal activities.  She has been having to give him his nebulizer treatments a couple times a day.  Feels like what he is not currently on is not helping.  They have also noted that he has had some increased swelling in his lower legs, left greater than right.  He does have chronic venous insufficiency and peripheral vascular disease, which he is not a candidate for open surgery or endovascular approach. Does note that it is difficult to lie flat at night.  He is on his baseline 3 L/min; they have not been routinely monitoring his oxygen levels at home.  He has been compliant with Breztri 2 puffs twice a day, which his daughter has been helping to make sure that he gets. D dimer significantly higher than normal; CTA ordered which was negative for PE. Eosinophils were elevated on CBC, advised to start OTC antihistamine.   02/25/2022: [name] with Cobb NP for follow up after being treated for AECOPD two weeks ago. He completed his doxycycline and prednisone courses. He reports feeling significantly better. Breathing is  mostly back to his baseline. He still has an occasional wheeze, but resolves quickly with his breathing treatments. He is able to perform ADLs without difficulties. He has had no increase O2 demand and is currently on 3 lpm POC. Lower extremity swelling is unchanged from baseline. He denies cough, fevers. He continues on [place] Twice daily. Using xopenex nebs as needed.   03/29/2022: [name] with Cobb NP for increased wheezing over the last week or so.  She reports that the nurse from [facility] had come out last week and after listening to him, advised that they come in for an office visit because he sounded pretty wheezy.  He reports that he feels like he is breathing relatively okay.  Does not feel significantly different from his baseline; however, he has been declining for the past few months.  He does have an occasional cough which is dry.  His daughter does report that he has been sleeping a lot more during the day over the last month.  Does not seem to want to do as much as he used to.  He does still enjoy going fishing and hunting when able.  He has also had some increased swelling in his legs. Possible AECOPD vs fluid overload. Given his breathing is stable and he has BLE edema, recommended we trial short course of lasix and hold off on further prednisone. He has also not been consistent with Breztri use. We discussed the important of maintenance therapies. He does do well with neb treatments. I do believe his disease is progressing as well given with his declining activity intolerance and DOE over the past few months so we will trial change to triple therapy nebs. They are agreeable to this plan and his daughters will ensure he completes his neb therapy in  the AM. We also discussed the involvement of palliative care - pt is open to this but they would like to discuss with the rest of the family. They will notify if they would like to move forward with a referral.   04/12/2022: Today - follow up Patient  presents today with daughter for follow up. Last time, we treated him for concern for fluid overload with lasix of 40 mg for 5 days. They ended up giving him 20 mg instead, which he has been taking daily since. He had great response to it and feels like his breathing is better than it has been in a long time and wheezing has resolved. His legs are no longer swollen and he is down about 10 pounds on his weight. We had also planned to switch him to triple therapy nebs; however, these were not affordable to they have kept him on the [place]. They do not have any concerns or complaints today. Oxygen is stable on his baseline of 3 lpm.   [name] 07/15/2022  Subjective:  Patient ID: [name], male , DOB: [birth year] , age 64 y.o. , MRN: [id number] , ADDRESS: [place] [id number] PCP [name], MD Patient Care Team: [name], MD as PCP - General (Family Medicine) Sueanne Margarita, MD as PCP - Cardiology (Cardiology) Sueanne Margarita, MD as Attending Physician (Cardiology)  This Provider for this visit: Treatment Team:  Attending Provider: Brand Males, MD    07/15/2022 -   Chief Complaint  Patient presents with   Follow-up    Pt has had a couple visits either to urgent care of the ED due to COPD exacerbation. Pt was placed on steroids which did help improve pt's breathing which pt finished 10/1.     HPI ACEA YAGI 88 y.o. -returns for follow-up.  Personally seen a year ago but since then he has had multiple visits to this office to see nurse practitioner and also admissions and ER visits for COPD exacerbation.  He has had 2 urgent care visits.  3 prednisone in 2023 according to the daughter.  This is despite oxygen and breztri with which she is compliant.  He does do activities of daily living independently in the house.  But otherwise is ECOG 3-4.  He takes oxygen as well.  Daughter is puzzled by the recurrent exacerbations.  Most recently on 07/08/2022  blood eosinophils were extremely high at 700 cells per cubic millimeter [previously normal].  We discussed Dupixent therapy but he is fearful of needles.  We also discussed Roflumilast and he is willing to try this.  Larkin Ina is also living with her daughter indicated that he was having frequent falls and intermittent left-sided weakness associated with these falls.  Never seen neurology.  Not discussed with primary care.    PFT     Latest Ref Rng & Units 11/17/2020   12:55 PM 05/20/2018    2:41 PM  PFT Results  FVC-Pre L 1.77  1.63   FVC-Predicted Pre % 70  60   FVC-Post L 1.79  1.71   FVC-Predicted Post % 71  63   Pre FEV1/FVC % % 47  48   Post FEV1/FCV % % 49  46   FEV1-Pre L 0.84  0.77   FEV1-Predicted Pre % 49  42   FEV1-Post L 0.87  0.79   DLCO uncorrected ml/min/mmHg 10.65    DLCO UNC% % 57    DLCO corrected ml/min/mmHg 10.65  DLCO COR %Predicted % 57    DLVA Predicted % 81         has a past medical history of Anxiety, Asthma, Chronic diastolic CHF (congestive heart failure) ([place]), COPD (chronic obstructive pulmonary disease) ([place]), Coronary artery disease, Diabetes mellitus, Diverticulitis (Oct. 2013), DVT (deep venous thrombosis) ([place]), GERD (gastroesophageal reflux disease), Hypercholesterolemia, Hypertension, Hypothyroidism, Obesity (BMI 30-39.9), OSA (obstructive sleep apnea), Peripheral vascular disease ([place]) (11/2006), Shortness of breath, and Sleep apnea.   reports that he quit smoking about 58 years ago. His smoking use included cigarettes and cigars. He has a 7.50 pack-year smoking history. He has never used smokeless tobacco.  Past Surgical History:  Procedure Laterality Date   ABDOMINAL AORTAGRAM N/A 04/20/2012   Procedure: ABDOMINAL Maxcine Ham;  Surgeon: Angelia Mould, MD;  Location: [facility] CATH LAB;  Service: Cardiovascular;  Laterality: N/A;   ABDOMINAL AORTAGRAM N/A 12/29/2012   Procedure: ABDOMINAL Maxcine Ham;  Surgeon: Serafina Mitchell, MD;  [facility]  CATH LAB;  Service: Cardiovascular;  Laterality: N/A;   ABDOMINAL AORTOGRAM W/LOWER EXTREMITY Bilateral 03/16/2021   Procedure: ABDOMINAL AORTOGRAM W/LOWER EXTREMITY;  Surgeon: Angelia Mould, MD;  Location: [facility] CV LAB;  Service: Cardiovascular;  Laterality: Bilateral;   BALLOON DILATION  12/26/2011   Procedure: BALLOON DILATION;  Surgeon: Garlan Fair, MD;  Location: [facility] ENDOSCOPY;  Service: Endoscopy;  Laterality: N/A;   COLONOSCOPY  08/14/2012   Procedure: COLONOSCOPY;  Surgeon: Arta Silence, MD;  Location: [facility] ENDOSCOPY;  Service: Endoscopy;  Laterality: N/A;   CORONARY STENT PLACEMENT  2010 and 2012   ESOPHAGOGASTRODUODENOSCOPY  12/26/2011   Procedure: ESOPHAGOGASTRODUODENOSCOPY (EGD);  Surgeon: Garlan Fair, MD;  Location: [facility] ENDOSCOPY;  Service: Endoscopy;  Laterality: N/A;   ESOPHAGOGASTRODUODENOSCOPY  08/13/2012   Procedure: ESOPHAGOGASTRODUODENOSCOPY (EGD);  Surgeon: Arta Silence, MD;  Location: [facility] ENDOSCOPY;  Service: Endoscopy;  Laterality: Left;   HERNIA REPAIR  1992   evntral hernia repair   LOWER EXTREMITY ANGIOGRAM Bilateral 12/29/2012   Procedure: LOWER EXTREMITY ANGIOGRAM;  Surgeon: Serafina Mitchell, MD;  Location[facility] CATH LAB;  Service: Cardiovascular;  Laterality: Bilateral;  bilat lower extrem angio    Allergies  Allergen Reactions   Clopidogrel Itching    Immunization History  Administered Date(s) Administered   Fluad Quad(high Dose 65+) 08/09/2021   Influenza Split 08/01/2009, 08/16/2010, 07/14/2013   Influenza, High Dose Seasonal PF 06/19/2016, 07/07/2017, 11/05/2018, 08/11/2019   Influenza,inj,quad, With Preservative 07/04/2014   Influenza-Unspecified 09/24/2011, 07/01/2012, 08/20/2013   PFIZER(Purple Top)SARS-COV-2 Vaccination 12/27/2019, 01/19/2020, 02/23/2021   Pneumococcal Conjugate-13 08/20/2013   Pneumococcal Polysaccharide-23 01/13/1999   Pneumococcal-Unspecified 08/14/2013   Td 01/13/1999   Tdap 12/24/2011, 01/12/2019   Zoster,  Live 10/23/2009    Family History  Problem Relation Age of Onset   Diabetes Mother    Hyperlipidemia Mother    Hypertension Mother    Heart attack Mother    Heart disease Mother        before age 42   Diabetes Brother    Heart disease Father        before age 13   Breast cancer Sister    Diabetes Sister    Hyperlipidemia Sister    Hypertension Sister    Heart attack Sister    Hyperlipidemia Sister    Hypertension Sister    Heart disease Sister        Heart Disease before age 82   Asthma Sister    Malignant hyperthermia Neg Hx      Current Outpatient Medications:  albuterol (PROVENTIL HFA;VENTOLIN HFA) 108 (90 BASE) MCG/ACT inhaler, Inhale 2 puffs into the lungs every 6 (six) hours as needed for wheezing or shortness of breath. , Disp: , Rfl:    aspirin EC 81 MG tablet, Take 81 mg by mouth daily., Disp: , Rfl:    Budeson-Glycopyrrol-Formoterol (BREZTRI AEROSPHERE) 160-9-4.8 MCG/ACT AERO, Inhale 2 puffs into the lungs in the morning and at bedtime., Disp: , Rfl:    budesonide (PULMICORT) 0.5 MG/2ML nebulizer solution, Take 2 mLs (0.5 mg total) by nebulization 2 (two) times daily., Disp: 360 mL, Rfl: 3   ezetimibe (ZETIA) 10 MG tablet, Take 1 tablet (10 mg total) by mouth daily., Disp: 90 tablet, Rfl: 3   formoterol (PERFOROMIST) 20 MCG/2ML nebulizer solution, Take 2 mLs (20 mcg total) by nebulization 2 (two) times daily., Disp: 360 mL, Rfl: 3   glimepiride (AMARYL) 2 MG tablet, Take 2 mg by mouth every evening. , Disp: , Rfl:    ipratropium (ATROVENT) 0.02 % nebulizer solution, Take 2.5 mLs (0.5 mg total) by nebulization every 6 (six) hours as needed for wheezing or shortness of breath., Disp: 75 mL, Rfl: 3   levalbuterol (XOPENEX) 0.63 MG/3ML nebulizer solution, Take 3 mLs (0.63 mg total) by nebulization every 6 (six) hours as needed for wheezing or shortness of breath., Disp: 300 mL, Rfl: 3   levothyroxine (SYNTHROID, LEVOTHROID) 150 MCG tablet, Take 150 mcg by mouth every  evening. , Disp: , Rfl:    meloxicam (MOBIC) 15 MG tablet, Take 15 mg by mouth daily as needed for pain., Disp: , Rfl:    metoprolol succinate (TOPROL-XL) 50 MG 24 hr tablet, Take 1 tablet (50 mg total) by mouth daily. Take with or immediately following a meal., Disp: 90 tablet, Rfl: 3   nitroGLYCERIN (NITROSTAT) 0.4 MG SL tablet, Place 1 tablet (0.4 mg total) under the tongue every 5 (five) minutes as needed for chest pain., Disp: 25 tablet, Rfl: 6   OXYGEN, Inhale 3 application into the lungs. 3 liters n/c, Disp: , Rfl:    potassium chloride SA (KLOR-CON M) 20 MEQ tablet, Take 1 tablet (20 mEq total) by mouth daily., Disp: 30 tablet, Rfl: 0   Roflumilast (DALIRESP) 250 MCG TABS, Take 1 tablet by mouth daily., Disp: 28 tablet, Rfl: 0   roflumilast (DALIRESP) 500 MCG TABS tablet, Take 1 tablet (500 mcg total) by mouth daily., Disp: 30 tablet, Rfl: 11   rosuvastatin (CRESTOR) 20 MG tablet, Take 1 tablet (20 mg total) by mouth daily., Disp: 90 tablet, Rfl: 3   sertraline (ZOLOFT) 50 MG tablet, SMARTSIG:1 Tablet(s) By Mouth Every Evening, Disp: , Rfl:       Objective:   Vitals:   07/15/22 1437  BP: 122/62  Pulse: 85  Temp: 98.3 F (36.8 C)  TempSrc: Oral  SpO2: 92%  Weight: 167 lb 6.4 oz (75.9 kg)  Height: '5\' 3"'$  (1.6 m)    Estimated body mass index is 29.65 kg/m as calculated from the following:   Height as of this encounter: '5\' 3"'$  (1.6 m).   Weight as of this encounter: 167 lb 6.4 oz (75.9 kg).  '@WEIGHTCHANGE'$ @  [employer]   07/15/22 1437  Weight: 167 lb 6.4 oz (75.9 kg)     Physical Exam    General: No distress. Looks well. Sitting in wheel chair. Smiling Neuro: Alert and Oriented x 3. GCS 15. Speech normal Psych: Pleasant Resp:  Barrel Chest - .Marland Kitchen  Wheeze - no, Crackles - no, No overt respiratory distress  CVS: Normal heart sounds. Murmurs - no Ext: Stigmata of Connective Tissue Disease - no.  Some chronic edema present HEENT: Normal upper airway. PEERL +. No post  nasal drip        Assessment:       ICD-10-CM   1. Chronic respiratory failure with hypoxia (HCC)  J96.11     2. COPD, very severe ([place])  J44.9     3. COPD, frequent exacerbations ([place])  J44.1     4. Eosinophilia, unspecified type  D72.10     5. Needle phobia  F40.298     6. Frequent falls  R29.6 Ambulatory referral to Neurology    7. Hemiparesis of left nondominant side, unspecified hemiparesis etiology ([place])  G81.94          Plan:     Patient Instructions     ICD-10-CM   1. Chronic respiratory failure with hypoxia (HCC)  J96.11     2. COPD, very severe ([place])  J44.9     3. COPD, frequent exacerbations ([place])  J44.1     4. Eosinophilia, unspecified type  D72.10     5. Needle phobia  F40.298        Multiple flareups of COPD this year 2023.  Most recent flareup was last week with prednisone concluding yesterday.  Currently stable without flareup.  Blood eosinophil 07/08/2022 significantly high suggesting a mechanism for frequent flareups  Plan - Discussed Dupixent therapy but respect your hesitancy to have any injection due to fear of needles -Start Roflumilast  - Take new tablet called roflumilast 1 tablet   - starter pack 237mg per day after food x 30 days or 1 month  - then escalate to 5020m daily after food x continue at this dose  - any side effects especially GI call [place] Continue Breztri 2 puffs Twice daily  - Brush tongue and rinse mouth afterwards Continue Albuterol inhaler 2 puffs or levalbuterol 3 mL neb every 6 hours as needed for shortness of breath or wheezing. Continue mucinex 600 mg Twice daily  Continue lasix 20 mg daily  Continue supplemental oxygen 3 lpm POC and continuous at night for goal oxygen >88-90%   Frequent falls Left-sided weakness intermittently  Plan - If this happens again and is sustained for more than a few minutes call 911 -Refer to neurology -Discussed with primary care physician   Follow-up - Return in 4-8 weeks  to see nurse practitioner or Dr. RaChase Calleror a quick follow-up  -If above strategy not working we will revisit Dupixent again    SIGNATURE    Dr. MuBrand MalesM.D., F.C.C.P,  Pulmonary and Critical Care Medicine Staff Physician, [place]irector - Interstitial Lung Disease  Program  Pulmonary Fi[id number]: [phone] no answer or between  15:00h - 7:00h: call [phone] Telephone: [phone]  3:10 PM 07/15/2022

## 2022-07-16 ENCOUNTER — Encounter: Payer: Self-pay | Admitting: Neurology

## 2022-07-23 ENCOUNTER — Telehealth: Payer: Self-pay | Admitting: Internal Medicine

## 2022-07-23 NOTE — Telephone Encounter (Signed)
Called and spoke with patient's daughter [name]. She stated that the patient start Daliresp 232mg last week. He has been experiencing stomach cramping, fatigue and a headache ever since he started the medication. Today he had no desire to get out of bed, which is something new for him. He took his last dose yesterday.   I advised her to hold the medication until she hears back from the office. She verbalized understanding.   MR, can you please advise? Thanks!

## 2022-07-23 NOTE — Telephone Encounter (Signed)
Dc daliresp List daliresp as allergy No more daliresp

## 2022-07-24 NOTE — Telephone Encounter (Signed)
Called and spoke with [name]. [name] verbalized understanding. Nothing further needed.

## 2022-08-13 ENCOUNTER — Other Ambulatory Visit (HOSPITAL_COMMUNITY): Payer: Self-pay | Admitting: Family Medicine

## 2022-08-13 ENCOUNTER — Ambulatory Visit (HOSPITAL_BASED_OUTPATIENT_CLINIC_OR_DEPARTMENT_OTHER)
Admission: RE | Admit: 2022-08-13 | Discharge: 2022-08-13 | Disposition: A | Discharge status: Home / Self Care | Payer: Medicare HMO | Source: Ambulatory Visit | Attending: Family Medicine | Admitting: Family Medicine

## 2022-08-13 ENCOUNTER — Other Ambulatory Visit: Payer: Self-pay | Admitting: Family Medicine

## 2022-08-13 DIAGNOSIS — J449 Chronic obstructive pulmonary disease, unspecified: Secondary | ICD-10-CM | POA: Diagnosis not present

## 2022-08-13 DIAGNOSIS — R1084 Generalized abdominal pain: Secondary | ICD-10-CM | POA: Insufficient documentation

## 2022-08-13 DIAGNOSIS — R634 Abnormal weight loss: Secondary | ICD-10-CM

## 2022-08-13 DIAGNOSIS — I110 Hypertensive heart disease with heart failure: Secondary | ICD-10-CM | POA: Diagnosis not present

## 2022-08-13 DIAGNOSIS — E1169 Type 2 diabetes mellitus with other specified complication: Secondary | ICD-10-CM | POA: Diagnosis not present

## 2022-08-13 DIAGNOSIS — F0390 Unspecified dementia without behavioral disturbance: Secondary | ICD-10-CM | POA: Diagnosis not present

## 2022-08-13 DIAGNOSIS — R109 Unspecified abdominal pain: Secondary | ICD-10-CM | POA: Diagnosis not present

## 2022-08-13 DIAGNOSIS — J9691 Respiratory failure, unspecified with hypoxia: Secondary | ICD-10-CM | POA: Diagnosis not present

## 2022-08-13 DIAGNOSIS — F411 Generalized anxiety disorder: Secondary | ICD-10-CM | POA: Diagnosis not present

## 2022-08-17 ENCOUNTER — Inpatient Hospital Stay (HOSPITAL_COMMUNITY): Payer: Medicare HMO

## 2022-08-17 ENCOUNTER — Inpatient Hospital Stay (HOSPITAL_COMMUNITY)
Admission: EM | Admit: 2022-08-17 | Discharge: 2022-09-13 | DRG: 871 | Disposition: E | Payer: Medicare HMO | Attending: Critical Care Medicine | Admitting: Critical Care Medicine

## 2022-08-17 ENCOUNTER — Emergency Department (HOSPITAL_COMMUNITY): Payer: Medicare HMO

## 2022-08-17 DIAGNOSIS — K219 Gastro-esophageal reflux disease without esophagitis: Secondary | ICD-10-CM | POA: Diagnosis present

## 2022-08-17 DIAGNOSIS — Z955 Presence of coronary angioplasty implant and graft: Secondary | ICD-10-CM

## 2022-08-17 DIAGNOSIS — J441 Chronic obstructive pulmonary disease with (acute) exacerbation: Secondary | ICD-10-CM

## 2022-08-17 DIAGNOSIS — I469 Cardiac arrest, cause unspecified: Secondary | ICD-10-CM

## 2022-08-17 DIAGNOSIS — S2242XA Multiple fractures of ribs, left side, initial encounter for closed fracture: Secondary | ICD-10-CM | POA: Diagnosis present

## 2022-08-17 DIAGNOSIS — X58XXXA Exposure to other specified factors, initial encounter: Secondary | ICD-10-CM | POA: Diagnosis present

## 2022-08-17 DIAGNOSIS — D696 Thrombocytopenia, unspecified: Secondary | ICD-10-CM | POA: Diagnosis present

## 2022-08-17 DIAGNOSIS — E669 Obesity, unspecified: Secondary | ICD-10-CM | POA: Diagnosis present

## 2022-08-17 DIAGNOSIS — Z86718 Personal history of other venous thrombosis and embolism: Secondary | ICD-10-CM

## 2022-08-17 DIAGNOSIS — Z1152 Encounter for screening for COVID-19: Secondary | ICD-10-CM | POA: Diagnosis not present

## 2022-08-17 DIAGNOSIS — I110 Hypertensive heart disease with heart failure: Secondary | ICD-10-CM | POA: Diagnosis present

## 2022-08-17 DIAGNOSIS — J9811 Atelectasis: Secondary | ICD-10-CM | POA: Diagnosis not present

## 2022-08-17 DIAGNOSIS — D649 Anemia, unspecified: Secondary | ICD-10-CM | POA: Diagnosis present

## 2022-08-17 DIAGNOSIS — Z79899 Other long term (current) drug therapy: Secondary | ICD-10-CM

## 2022-08-17 DIAGNOSIS — J45909 Unspecified asthma, uncomplicated: Secondary | ICD-10-CM | POA: Diagnosis not present

## 2022-08-17 DIAGNOSIS — E1151 Type 2 diabetes mellitus with diabetic peripheral angiopathy without gangrene: Secondary | ICD-10-CM | POA: Diagnosis present

## 2022-08-17 DIAGNOSIS — J439 Emphysema, unspecified: Secondary | ICD-10-CM | POA: Diagnosis present

## 2022-08-17 DIAGNOSIS — A419 Sepsis, unspecified organism: Secondary | ICD-10-CM | POA: Diagnosis not present

## 2022-08-17 DIAGNOSIS — Z7982 Long term (current) use of aspirin: Secondary | ICD-10-CM

## 2022-08-17 DIAGNOSIS — E8729 Other acidosis: Secondary | ICD-10-CM | POA: Diagnosis present

## 2022-08-17 DIAGNOSIS — N390 Urinary tract infection, site not specified: Secondary | ICD-10-CM | POA: Diagnosis present

## 2022-08-17 DIAGNOSIS — R109 Unspecified abdominal pain: Secondary | ICD-10-CM | POA: Diagnosis not present

## 2022-08-17 DIAGNOSIS — Z791 Long term (current) use of non-steroidal anti-inflammatories (NSAID): Secondary | ICD-10-CM

## 2022-08-17 DIAGNOSIS — Z803 Family history of malignant neoplasm of breast: Secondary | ICD-10-CM

## 2022-08-17 DIAGNOSIS — Z888 Allergy status to other drugs, medicaments and biological substances status: Secondary | ICD-10-CM

## 2022-08-17 DIAGNOSIS — I452 Bifascicular block: Secondary | ICD-10-CM | POA: Diagnosis present

## 2022-08-17 DIAGNOSIS — Z87891 Personal history of nicotine dependence: Secondary | ICD-10-CM

## 2022-08-17 DIAGNOSIS — Z833 Family history of diabetes mellitus: Secondary | ICD-10-CM

## 2022-08-17 DIAGNOSIS — Z515 Encounter for palliative care: Secondary | ICD-10-CM

## 2022-08-17 DIAGNOSIS — G9340 Encephalopathy, unspecified: Secondary | ICD-10-CM | POA: Diagnosis not present

## 2022-08-17 DIAGNOSIS — R404 Transient alteration of awareness: Secondary | ICD-10-CM | POA: Diagnosis not present

## 2022-08-17 DIAGNOSIS — R911 Solitary pulmonary nodule: Secondary | ICD-10-CM | POA: Diagnosis not present

## 2022-08-17 DIAGNOSIS — M7989 Other specified soft tissue disorders: Secondary | ICD-10-CM | POA: Diagnosis not present

## 2022-08-17 DIAGNOSIS — F32A Depression, unspecified: Secondary | ICD-10-CM | POA: Diagnosis present

## 2022-08-17 DIAGNOSIS — Z79890 Hormone replacement therapy (postmenopausal): Secondary | ICD-10-CM

## 2022-08-17 DIAGNOSIS — J9622 Acute and chronic respiratory failure with hypercapnia: Secondary | ICD-10-CM

## 2022-08-17 DIAGNOSIS — G40901 Epilepsy, unspecified, not intractable, with status epilepticus: Secondary | ICD-10-CM | POA: Diagnosis not present

## 2022-08-17 DIAGNOSIS — R001 Bradycardia, unspecified: Secondary | ICD-10-CM | POA: Diagnosis present

## 2022-08-17 DIAGNOSIS — G4733 Obstructive sleep apnea (adult) (pediatric): Secondary | ICD-10-CM | POA: Diagnosis present

## 2022-08-17 DIAGNOSIS — E7800 Pure hypercholesterolemia, unspecified: Secondary | ICD-10-CM | POA: Diagnosis present

## 2022-08-17 DIAGNOSIS — I499 Cardiac arrhythmia, unspecified: Secondary | ICD-10-CM | POA: Diagnosis not present

## 2022-08-17 DIAGNOSIS — Z83438 Family history of other disorder of lipoprotein metabolism and other lipidemia: Secondary | ICD-10-CM

## 2022-08-17 DIAGNOSIS — Z66 Do not resuscitate: Secondary | ICD-10-CM | POA: Diagnosis not present

## 2022-08-17 DIAGNOSIS — G931 Anoxic brain damage, not elsewhere classified: Secondary | ICD-10-CM | POA: Diagnosis present

## 2022-08-17 DIAGNOSIS — E039 Hypothyroidism, unspecified: Secondary | ICD-10-CM | POA: Diagnosis present

## 2022-08-17 DIAGNOSIS — I5032 Chronic diastolic (congestive) heart failure: Secondary | ICD-10-CM | POA: Diagnosis present

## 2022-08-17 DIAGNOSIS — Z8249 Family history of ischemic heart disease and other diseases of the circulatory system: Secondary | ICD-10-CM

## 2022-08-17 DIAGNOSIS — R1084 Generalized abdominal pain: Secondary | ICD-10-CM | POA: Diagnosis not present

## 2022-08-17 DIAGNOSIS — R6521 Severe sepsis with septic shock: Secondary | ICD-10-CM | POA: Diagnosis present

## 2022-08-17 DIAGNOSIS — Z9981 Dependence on supplemental oxygen: Secondary | ICD-10-CM

## 2022-08-17 DIAGNOSIS — R600 Localized edema: Secondary | ICD-10-CM | POA: Diagnosis present

## 2022-08-17 DIAGNOSIS — Z6829 Body mass index (BMI) 29.0-29.9, adult: Secondary | ICD-10-CM

## 2022-08-17 DIAGNOSIS — I2510 Atherosclerotic heart disease of native coronary artery without angina pectoris: Secondary | ICD-10-CM | POA: Diagnosis present

## 2022-08-17 DIAGNOSIS — R55 Syncope and collapse: Secondary | ICD-10-CM | POA: Diagnosis not present

## 2022-08-17 DIAGNOSIS — Z7951 Long term (current) use of inhaled steroids: Secondary | ICD-10-CM

## 2022-08-17 DIAGNOSIS — J9621 Acute and chronic respiratory failure with hypoxia: Secondary | ICD-10-CM | POA: Diagnosis not present

## 2022-08-17 DIAGNOSIS — R4182 Altered mental status, unspecified: Secondary | ICD-10-CM | POA: Diagnosis not present

## 2022-08-17 DIAGNOSIS — R569 Unspecified convulsions: Secondary | ICD-10-CM | POA: Diagnosis not present

## 2022-08-17 DIAGNOSIS — Z825 Family history of asthma and other chronic lower respiratory diseases: Secondary | ICD-10-CM

## 2022-08-17 LAB — COMPREHENSIVE METABOLIC PANEL
ALT: 25 U/L (ref 0–44)
ALT: 27 U/L (ref 0–44)
AST: 48 U/L — ABNORMAL HIGH (ref 15–41)
AST: 48 U/L — ABNORMAL HIGH (ref 15–41)
Albumin: 3.3 g/dL — ABNORMAL LOW (ref 3.5–5.0)
Albumin: 3.5 g/dL (ref 3.5–5.0)
Alkaline Phosphatase: 39 U/L (ref 38–126)
Alkaline Phosphatase: 45 U/L (ref 38–126)
Anion gap: 19 — ABNORMAL HIGH (ref 5–15)
Anion gap: 18 — ABNORMAL HIGH (ref 5–15)
BUN: 11 mg/dL (ref 8–23)
BUN: 12 mg/dL (ref 8–23)
CO2: 19 mmol/L — ABNORMAL LOW (ref 22–32)
CO2: 20 mmol/L — ABNORMAL LOW (ref 22–32)
Calcium: 9.4 mg/dL (ref 8.9–10.3)
Calcium: 9.4 mg/dL (ref 8.9–10.3)
Chloride: 101 mmol/L (ref 98–111)
Chloride: 101 mmol/L (ref 98–111)
Creatinine, Ser: 1.2 mg/dL (ref 0.61–1.24)
Creatinine, Ser: 1.08 mg/dL (ref 0.61–1.24)
GFR, Estimated: 57 mL/min — ABNORMAL LOW (ref >60)
GFR, Estimated: >60 mL/min (ref >60)
Glucose, Bld: 159 mg/dL — ABNORMAL HIGH (ref 70–99)
Glucose, Bld: 146 mg/dL — ABNORMAL HIGH (ref 70–99)
Potassium: 4.7 mmol/L (ref 3.5–5.1)
Potassium: 3.8 mmol/L (ref 3.5–5.1)
Sodium: 139 mmol/L (ref 135–145)
Sodium: 139 mmol/L (ref 135–145)
Total Bilirubin: 0.8 mg/dL (ref 0.3–1.2)
Total Bilirubin: 1.2 mg/dL (ref 0.3–1.2)
Total Protein: 5.8 g/dL — ABNORMAL LOW (ref 6.5–8.1)
Total Protein: 6 g/dL — ABNORMAL LOW (ref 6.5–8.1)

## 2022-08-17 LAB — POCT I-STAT 7, (LYTES, BLD GAS, ICA,H+H)
Acid-base deficit: 1 mmol/L (ref 0.0–2.0)
Bicarbonate: 22.1 mmol/L (ref 20.0–28.0)
Calcium, Ion: 1.14 mmol/L — ABNORMAL LOW (ref 1.15–1.40)
HCT: 41 % (ref 39.0–52.0)
Hemoglobin: 13.9 g/dL (ref 13.0–17.0)
O2 Saturation: 99 %
Patient temperature: 35.6
Potassium: 3.1 mmol/L — ABNORMAL LOW (ref 3.5–5.1)
Sodium: 137 mmol/L (ref 135–145)
TCO2: 23 mmol/L (ref 22–32)
pCO2 arterial: 30.7 mmHg — ABNORMAL LOW (ref 32–48)
pH, Arterial: 7.46 — ABNORMAL HIGH (ref 7.35–7.45)
pO2, Arterial: 124 mmHg — ABNORMAL HIGH (ref 83–108)

## 2022-08-17 LAB — LACTIC ACID, PLASMA
Lactic Acid, Venous: 7.6 mmol/L (ref 0.5–1.9)
Lactic Acid, Venous: 4.2 mmol/L (ref 0.5–1.9)
Lactic Acid, Venous: 4.4 mmol/L (ref 0.5–1.9)

## 2022-08-17 LAB — CBC WITH DIFFERENTIAL/PLATELET
Abs Immature Granulocytes: 0.29 10*3/uL — ABNORMAL HIGH (ref 0.00–0.07)
Basophils Absolute: 0.1 10*3/uL (ref 0.0–0.1)
Basophils Relative: 1 %
Eosinophils Absolute: 0.6 10*3/uL — ABNORMAL HIGH (ref 0.0–0.5)
Eosinophils Relative: 5 %
HCT: 40.7 % (ref 39.0–52.0)
Hemoglobin: 12.8 g/dL — ABNORMAL LOW (ref 13.0–17.0)
Immature Granulocytes: 2 %
Lymphocytes Relative: 42 %
Lymphs Abs: 5.6 10*3/uL — ABNORMAL HIGH (ref 0.7–4.0)
MCH: 30.4 pg (ref 26.0–34.0)
MCHC: 31.4 g/dL (ref 30.0–36.0)
MCV: 96.7 fL (ref 80.0–100.0)
Monocytes Absolute: 0.8 10*3/uL (ref 0.1–1.0)
Monocytes Relative: 6 %
Neutro Abs: 5.8 10*3/uL (ref 1.7–7.7)
Neutrophils Relative %: 44 %
Platelets: 113 10*3/uL — ABNORMAL LOW (ref 150–400)
RBC: 4.21 MIL/uL — ABNORMAL LOW (ref 4.22–5.81)
RDW: 12.8 % (ref 11.5–15.5)
WBC: 13.2 10*3/uL — ABNORMAL HIGH (ref 4.0–10.5)
nRBC: 0 % (ref 0.0–0.2)

## 2022-08-17 LAB — GLUCOSE, CAPILLARY
Glucose-Capillary: 173 mg/dL — ABNORMAL HIGH (ref 70–99)
Glucose-Capillary: 186 mg/dL — ABNORMAL HIGH (ref 70–99)
Glucose-Capillary: 186 mg/dL — ABNORMAL HIGH (ref 70–99)
Glucose-Capillary: 196 mg/dL — ABNORMAL HIGH (ref 70–99)
Glucose-Capillary: 204 mg/dL — ABNORMAL HIGH (ref 70–99)

## 2022-08-17 LAB — ECHOCARDIOGRAM COMPLETE
AR max vel: 2.69 cm2
AV Area VTI: 2.8 cm2
AV Area mean vel: 2.41 cm2
AV Mean grad: 4 mmHg
AV Peak grad: 6.9 mmHg
Ao pk vel: 1.31 m/s
Calc EF: 62.1 %
Height: 63 in
S' Lateral: 2.5 cm
Single Plane A2C EF: 55.7 %
Single Plane A4C EF: 68.4 %
Weight: 2680.79 oz

## 2022-08-17 LAB — URINALYSIS, ROUTINE W REFLEX MICROSCOPIC
Bilirubin Urine: NEGATIVE
Glucose, UA: NEGATIVE mg/dL
Hgb urine dipstick: NEGATIVE
Ketones, ur: NEGATIVE mg/dL
Nitrite: NEGATIVE
Protein, ur: NEGATIVE mg/dL
Specific Gravity, Urine: 1.024 (ref 1.005–1.030)
pH: 5 (ref 5.0–8.0)

## 2022-08-17 LAB — I-STAT VENOUS BLOOD GAS, ED
Acid-base deficit: 5 mmol/L — ABNORMAL HIGH (ref 0.0–2.0)
Bicarbonate: 24.8 mmol/L (ref 20.0–28.0)
Calcium, Ion: 1.16 mmol/L (ref 1.15–1.40)
HCT: 39 % (ref 39.0–52.0)
Hemoglobin: 13.3 g/dL (ref 13.0–17.0)
O2 Saturation: 99 %
Potassium: 4.5 mmol/L (ref 3.5–5.1)
Sodium: 136 mmol/L (ref 135–145)
TCO2: 27 mmol/L (ref 22–32)
pCO2, Ven: 64.8 mmHg — ABNORMAL HIGH (ref 44–60)
pH, Ven: 7.191 — CL (ref 7.25–7.43)
pO2, Ven: 149 mmHg — ABNORMAL HIGH (ref 32–45)

## 2022-08-17 LAB — MRSA NEXT GEN BY PCR, NASAL: MRSA by PCR Next Gen: NOT DETECTED

## 2022-08-17 LAB — I-STAT CHEM 8, ED
BUN: 13 mg/dL (ref 8–23)
Calcium, Ion: 1.16 mmol/L (ref 1.15–1.40)
Chloride: 99 mmol/L (ref 98–111)
Creatinine, Ser: 1 mg/dL (ref 0.61–1.24)
Glucose, Bld: 157 mg/dL — ABNORMAL HIGH (ref 70–99)
HCT: 40 % (ref 39.0–52.0)
Hemoglobin: 13.6 g/dL (ref 13.0–17.0)
Potassium: 4.5 mmol/L (ref 3.5–5.1)
Sodium: 136 mmol/L (ref 135–145)
TCO2: 25 mmol/L (ref 22–32)

## 2022-08-17 LAB — RAPID URINE DRUG SCREEN, HOSP PERFORMED
Amphetamines: NOT DETECTED
Barbiturates: NOT DETECTED
Benzodiazepines: POSITIVE — AB
Cocaine: NOT DETECTED
Opiates: NOT DETECTED
Tetrahydrocannabinol: NOT DETECTED

## 2022-08-17 LAB — TROPONIN I (HIGH SENSITIVITY)
Troponin I (High Sensitivity): 22 ng/L — ABNORMAL HIGH (ref <18)
Troponin I (High Sensitivity): 52 ng/L — ABNORMAL HIGH (ref <18)

## 2022-08-17 LAB — SALICYLATE LEVEL: Salicylate Lvl: <7 mg/dL — ABNORMAL LOW (ref 7.0–30.0)

## 2022-08-17 LAB — HEMOGLOBIN A1C
Hgb A1c MFr Bld: 6.1 % — ABNORMAL HIGH (ref 4.8–5.6)
Mean Plasma Glucose: 128.37 mg/dL

## 2022-08-17 LAB — AMMONIA: Ammonia: 59 umol/L — ABNORMAL HIGH (ref 9–35)

## 2022-08-17 LAB — ACETAMINOPHEN LEVEL: Acetaminophen (Tylenol), Serum: <10 ug/mL — ABNORMAL LOW (ref 10–30)

## 2022-08-17 LAB — MAGNESIUM: Magnesium: 1.5 mg/dL — ABNORMAL LOW (ref 1.7–2.4)

## 2022-08-17 LAB — RESP PANEL BY RT-PCR (FLU A&B, COVID) ARPGX2
Influenza A by PCR: NEGATIVE
Influenza B by PCR: NEGATIVE
SARS Coronavirus 2 by RT PCR: NEGATIVE

## 2022-08-17 LAB — ETHANOL: Alcohol, Ethyl (B): <10 mg/dL (ref <10)

## 2022-08-17 LAB — LIPASE, BLOOD: Lipase: 35 U/L (ref 11–51)

## 2022-08-17 LAB — TSH: TSH: 0.176 u[IU]/mL — ABNORMAL LOW (ref 0.350–4.500)

## 2022-08-17 LAB — PROCALCITONIN: Procalcitonin: 0.16 ng/mL

## 2022-08-17 LAB — BRAIN NATRIURETIC PEPTIDE: B Natriuretic Peptide: 90.2 pg/mL (ref 0.0–100.0)

## 2022-08-17 LAB — PROTIME-INR
INR: 1.3 — ABNORMAL HIGH (ref 0.8–1.2)
Prothrombin Time: 15.8 seconds — ABNORMAL HIGH (ref 11.4–15.2)

## 2022-08-17 LAB — CORTISOL: Cortisol, Plasma: 8.5 ug/dL

## 2022-08-17 MED ORDER — ORAL CARE MOUTH RINSE
15.0000 mL | OROMUCOSAL | Status: DC
  Administered 2022-08-17 – 2022-08-18 (×9): 15 mL via OROMUCOSAL

## 2022-08-17 MED ORDER — LEVETIRACETAM IN NACL 1500 MG/100ML IV SOLN
1500.0000 mg | Freq: Two times a day (BID) | INTRAVENOUS | Status: DC
  Administered 2022-08-17 – 2022-08-18 (×2): 1500 mg via INTRAVENOUS
  Filled 2022-08-17 (×3): qty 100

## 2022-08-17 MED ORDER — BUSPIRONE HCL 15 MG PO TABS: 30.0000 mg | ORAL_TABLET | Freq: Three times a day (TID) | ORAL | Status: DC | PRN

## 2022-08-17 MED ORDER — METRONIDAZOLE 500 MG/100ML IV SOLN
500.0000 mg | Freq: Two times a day (BID) | INTRAVENOUS | Status: DC
  Administered 2022-08-17 (×2): 500 mg via INTRAVENOUS
  Filled 2022-08-17 (×2): qty 100

## 2022-08-17 MED ORDER — SODIUM CHLORIDE 0.9 % IV SOLN: 250.0000 mL | INTRAVENOUS | Status: DC

## 2022-08-17 MED ORDER — ACETAMINOPHEN 325 MG PO TABS: 650.0000 mg | ORAL_TABLET | ORAL | Status: DC

## 2022-08-17 MED ORDER — POLYETHYLENE GLYCOL 3350 17 G PO PACK: 17.0000 g | PACK | Freq: Every day | ORAL | Status: DC

## 2022-08-17 MED ORDER — BUDESONIDE 0.25 MG/2ML IN SUSP
0.2500 mg | Freq: Two times a day (BID) | RESPIRATORY_TRACT | Status: DC
  Administered 2022-08-17 (×2): 0.25 mg via RESPIRATORY_TRACT
  Filled 2022-08-17 (×3): qty 2

## 2022-08-17 MED ORDER — ORAL CARE MOUTH RINSE: 15.0000 mL | OROMUCOSAL | Status: DC | PRN

## 2022-08-17 MED ORDER — IPRATROPIUM-ALBUTEROL 0.5-2.5 (3) MG/3ML IN SOLN
3.0000 mL | RESPIRATORY_TRACT | Status: DC
  Administered 2022-08-17 – 2022-08-18 (×7): 3 mL via RESPIRATORY_TRACT
  Filled 2022-08-17 (×8): qty 3

## 2022-08-17 MED ORDER — METHYLPREDNISOLONE SODIUM SUCC 125 MG IJ SOLR
125.0000 mg | Freq: Once | INTRAMUSCULAR | Status: AC
  Administered 2022-08-17: 125 mg via INTRAVENOUS
  Filled 2022-08-17: qty 2

## 2022-08-17 MED ORDER — IOHEXOL 9 MG/ML PO SOLN
ORAL | Status: AC
  Administered 2022-08-17: 500 mL
  Filled 2022-08-17: qty 1000

## 2022-08-17 MED ORDER — FENTANYL CITRATE PF 50 MCG/ML IJ SOSY: 25.0000 ug | PREFILLED_SYRINGE | INTRAMUSCULAR | Status: DC | PRN

## 2022-08-17 MED ORDER — ACETAMINOPHEN 650 MG RE SUPP: 650.0000 mg | RECTAL | Status: DC

## 2022-08-17 MED ORDER — ROSUVASTATIN CALCIUM 20 MG PO TABS
20.0000 mg | ORAL_TABLET | Freq: Every day | ORAL | Status: DC
  Administered 2022-08-17: 20 mg
  Filled 2022-08-17: qty 1

## 2022-08-17 MED ORDER — ACETAMINOPHEN 160 MG/5ML PO SOLN: 650.0000 mg | ORAL | Status: DC | PRN

## 2022-08-17 MED ORDER — POTASSIUM CHLORIDE 20 MEQ PO PACK: 40.0000 meq | PACK | ORAL | Status: DC

## 2022-08-17 MED ORDER — FENTANYL CITRATE PF 50 MCG/ML IJ SOSY
25.0000 ug | PREFILLED_SYRINGE | INTRAMUSCULAR | Status: DC | PRN
  Filled 2022-08-17: qty 1

## 2022-08-17 MED ORDER — EPINEPHRINE HCL 5 MG/250ML IV SOLN IN NS
0.5000 ug/min | INTRAVENOUS | Status: DC
  Administered 2022-08-17: 2 ug/min via INTRAVENOUS

## 2022-08-17 MED ORDER — LEVOTHYROXINE SODIUM 75 MCG PO TABS
150.0000 ug | ORAL_TABLET | Freq: Every evening | ORAL | Status: DC
  Administered 2022-08-17: 150 ug
  Filled 2022-08-17: qty 2

## 2022-08-17 MED ORDER — IOHEXOL 350 MG/ML SOLN
80.0000 mL | Freq: Once | INTRAVENOUS | Status: AC | PRN
  Administered 2022-08-17: 80 mL via INTRAVENOUS

## 2022-08-17 MED ORDER — VANCOMYCIN HCL 1250 MG/250ML IV SOLN
1250.0000 mg | Freq: Once | INTRAVENOUS | Status: AC
  Administered 2022-08-17: 1250 mg via INTRAVENOUS
  Filled 2022-08-17 (×2): qty 250

## 2022-08-17 MED ORDER — ROFLUMILAST 500 MCG PO TABS
500.0000 ug | ORAL_TABLET | Freq: Every day | ORAL | Status: DC
  Administered 2022-08-17: 500 ug
  Filled 2022-08-17 (×2): qty 1

## 2022-08-17 MED ORDER — ACETAMINOPHEN 325 MG PO TABS: 650.0000 mg | ORAL_TABLET | ORAL | Status: DC | PRN

## 2022-08-17 MED ORDER — ACETAMINOPHEN 650 MG RE SUPP: 650.0000 mg | RECTAL | Status: DC | PRN

## 2022-08-17 MED ORDER — FAMOTIDINE 20 MG PO TABS
20.0000 mg | ORAL_TABLET | Freq: Two times a day (BID) | ORAL | Status: DC
  Administered 2022-08-17: 20 mg
  Filled 2022-08-17: qty 1

## 2022-08-17 MED ORDER — SODIUM CHLORIDE 0.9 % IV BOLUS (SEPSIS)
1000.0000 mL | Freq: Once | INTRAVENOUS | Status: AC
  Administered 2022-08-17: 1000 mL via INTRAVENOUS

## 2022-08-17 MED ORDER — IPRATROPIUM-ALBUTEROL 0.5-2.5 (3) MG/3ML IN SOLN: 3.0000 mL | Freq: Four times a day (QID) | RESPIRATORY_TRACT | Status: DC | PRN

## 2022-08-17 MED ORDER — EZETIMIBE 10 MG PO TABS
10.0000 mg | ORAL_TABLET | Freq: Every day | ORAL | Status: DC
  Administered 2022-08-17: 10 mg
  Filled 2022-08-17: qty 1

## 2022-08-17 MED ORDER — INSULIN ASPART 100 UNIT/ML IJ SOLN
0.0000 [IU] | INTRAMUSCULAR | Status: DC
  Administered 2022-08-17: 2 [IU] via SUBCUTANEOUS
  Administered 2022-08-17: 3 [IU] via SUBCUTANEOUS
  Administered 2022-08-17 – 2022-08-18 (×4): 2 [IU] via SUBCUTANEOUS

## 2022-08-17 MED ORDER — MIDAZOLAM HCL 2 MG/2ML IJ SOLN
5.0000 mg | Freq: Once | INTRAMUSCULAR | Status: AC
  Administered 2022-08-17: 5 mg via INTRAVENOUS

## 2022-08-17 MED ORDER — PERFLUTREN LIPID MICROSPHERE
1.0000 mL | INTRAVENOUS | Status: AC | PRN
  Administered 2022-08-17: 3 mL via INTRAVENOUS

## 2022-08-17 MED ORDER — SODIUM CHLORIDE 0.9 % IV SOLN
1000.0000 mL | INTRAVENOUS | Status: DC
  Administered 2022-08-17 – 2022-08-18 (×4): 1000 mL via INTRAVENOUS

## 2022-08-17 MED ORDER — SODIUM CHLORIDE 0.9 % IV SOLN
250.0000 mL | INTRAVENOUS | Status: DC
  Administered 2022-08-17: 250 mL via INTRAVENOUS

## 2022-08-17 MED ORDER — SODIUM CHLORIDE 0.9 % IV SOLN
2.0000 g | Freq: Two times a day (BID) | INTRAVENOUS | Status: DC
  Administered 2022-08-17 (×2): 2 g via INTRAVENOUS
  Filled 2022-08-17 (×2): qty 12.5

## 2022-08-17 MED ORDER — CHLORHEXIDINE GLUCONATE CLOTH 2 % EX PADS
6.0000 | MEDICATED_PAD | Freq: Every day | CUTANEOUS | Status: DC
  Administered 2022-08-17: 6 via TOPICAL

## 2022-08-17 MED ORDER — MIDAZOLAM HCL 2 MG/2ML IJ SOLN
1.0000 mg | INTRAMUSCULAR | Status: DC | PRN
  Administered 2022-08-17: 2 mg via INTRAVENOUS
  Filled 2022-08-17 (×2): qty 2

## 2022-08-17 MED ORDER — DOCUSATE SODIUM 50 MG/5ML PO LIQD: 100.0000 mg | Freq: Two times a day (BID) | ORAL | Status: DC

## 2022-08-17 MED ORDER — VANCOMYCIN HCL IN DEXTROSE 1-5 GM/200ML-% IV SOLN: 1000.0000 mg | INTRAVENOUS | Status: DC

## 2022-08-17 MED ORDER — METHYLPREDNISOLONE SODIUM SUCC 40 MG IJ SOLR
40.0000 mg | Freq: Two times a day (BID) | INTRAMUSCULAR | Status: DC
  Administered 2022-08-17 – 2022-08-18 (×2): 40 mg via INTRAVENOUS
  Filled 2022-08-17 (×2): qty 1

## 2022-08-17 MED ORDER — MIDAZOLAM HCL 2 MG/2ML IJ SOLN
INTRAMUSCULAR | Status: AC
  Filled 2022-08-17: qty 6

## 2022-08-17 MED ORDER — FAMOTIDINE 20 MG PO TABS: 20.0000 mg | ORAL_TABLET | Freq: Every day | ORAL | Status: DC

## 2022-08-17 MED ORDER — ACETAMINOPHEN 160 MG/5ML PO SOLN
650.0000 mg | ORAL | Status: DC
  Administered 2022-08-17 – 2022-08-18 (×7): 650 mg
  Filled 2022-08-17 (×7): qty 20.3

## 2022-08-17 MED ORDER — SERTRALINE HCL 50 MG PO TABS
50.0000 mg | ORAL_TABLET | Freq: Every day | ORAL | Status: DC
  Administered 2022-08-17: 50 mg
  Filled 2022-08-17: qty 1

## 2022-08-17 MED ORDER — NOREPINEPHRINE 4 MG/250ML-% IV SOLN
2.0000 ug/min | INTRAVENOUS | Status: DC
  Administered 2022-08-17: 4 ug/min via INTRAVENOUS
  Filled 2022-08-17: qty 250

## 2022-08-17 MED ORDER — LACTULOSE 10 GM/15ML PO SOLN
30.0000 g | Freq: Once | ORAL | Status: AC
  Administered 2022-08-17: 30 g
  Filled 2022-08-17: qty 45

## 2022-08-17 MED ORDER — MAGNESIUM SULFATE 2 GM/50ML IV SOLN
2.0000 g | Freq: Once | INTRAVENOUS | Status: AC
  Administered 2022-08-17: 2 g via INTRAVENOUS
  Filled 2022-08-17: qty 50

## 2022-08-17 NOTE — ED Notes (Signed)
Epi gtt titrated up to 6

## 2022-08-17 NOTE — ED Notes (Signed)
MD notified of critical lactic  

## 2022-08-17 NOTE — Progress Notes (Signed)
eLink Physician-Brief Progress Note Patient Name: [name] DOB: September 29, 1930 MRN: [id number]   Date of Service  09/04/2022  HPI/Events of Note  88 yr old male s/p OOHA.  Hx of COPD and recent hospitalization.  Dyspnea prior to arrest.  Immediate bystander cpr.  Empiric abx and treatment for cpr.  Pressors now on hold.  Vent needs not excessive.  Eval ongoing.  eICU Interventions  Chart reviewed     Intervention Category Evaluation Type: New Patient Evaluation  Mauri Brooklyn, P 08/21/2022, 6:56 AM

## 2022-08-17 NOTE — ED Notes (Addendum)
Pt intubated by MD Cardama  Positive color change, bilat breath sounds heard  7.5 ETT - 25 at the lip

## 2022-08-17 NOTE — ED Notes (Signed)
Chaplin called for patient

## 2022-08-17 NOTE — ED Notes (Addendum)
Per PA place pt on bair hugger. After 3L repeat lactic

## 2022-08-17 NOTE — IPAL (Signed)
  Interdisciplinary Goals of Care Family Meeting   Date carried out: 09/06/2022  Location of the meeting: Bedside  Member's involved: Nurse Practitioner, Bedside Registered Nurse, and Family Member or next of kin  Durable Power of Attorney or acting medical decision maker: daughters.   [name] and her sister [name] who is medical POA  Discussion: We discussed goals of care for [name] .   I outlined my concern that he had a prolonged cardiac arrest and is currently exhibiting evidence of both myoclonic activity and possibly seizure.  We reviewed the imaging from CT scan, currently no evidence of acute neurological insult however this is too early to tell at this point.  The family shared with me there fathers values and that it was important to him that he is not on mechanical ventilation or life support for extended periods of time.  Nor would they want to prolong invasive support for any disease.  We agreed to DO NOT RESUSCITATE should his status deteriorate, and at this point we will continue with supportive care to see if there is any neurological improvement with plan to proceed on Monday with further neurodiagnostics  Code status: Full DNR  Disposition: Continue current acute care  Time spent for the meeting: 55 min     Clementeen Graham, NP  09/12/2022, 3:20 PM

## 2022-08-17 NOTE — ED Notes (Signed)
Dr Marshall at bedside

## 2022-08-17 NOTE — ED Notes (Signed)
1L bolus started

## 2022-08-17 NOTE — ED Triage Notes (Signed)
Pt BIB EMS from home - Pt had witnessed arrest by family - Family initiated CPR - EMS arrived - pt given 2 epis, ROSC achieved after 10 min total of CPR. Pt started on epi gtt at 52mg  Pt being demand paced - NSR at 90, occasionally brady at 40s, demand pacing and back up to 90 King airway in place - pt occasionally biting tube - EMS gave '5mg'$  versed and 100 fent  IO left tib

## 2022-08-17 NOTE — ED Notes (Signed)
20 Etomidate given by Hal Hope RN  100 Roc given by [employer] RN

## 2022-08-17 NOTE — Progress Notes (Signed)
LTM EEG hooked up and running - no initial skin breakdown - push button tested - [facility] monitoring.  

## 2022-08-17 NOTE — Care Plan (Addendum)
LTM eeg reviewed till 1657 Showed abundant generalized polyspikes as well as generalized eeg suppression. ICU team notified.  Please review final reports for details  Carolyn Sylvia Barbra Sarks .

## 2022-08-17 NOTE — ED Notes (Signed)
Per PA okay to bypass CT at this time and take pt up as there are no CT scanners available

## 2022-08-17 NOTE — ED Notes (Signed)
Xray at bedside  Family to be placed in consult A - MD notified

## 2022-08-17 NOTE — ED Provider Notes (Addendum)
[facility] EMERGENCY DEPARTMENT Provider Note  CSN: [id number] Arrival date & time: 08/16/2022 0217  Chief Complaint(s) Post CPR   HPI [name] is a 88 y.o. male with extensive past medical history listed below including hypertension, hyperlipidemia, asthma, COPD on 3 L nasal cannula at home, CAD status post stenting without prior MIs, chronic diastolic heart failure, prior DVTs not currently anticoagulated who presents to the emergency department as a witnessed cardiac arrest at home.  Per EMS they were called out for cardiac arrest.  Family was present during that time and began CPR until fire department arrived. ACLS was initiated. ROSC after 10 min. Intermittent bradycardia requiring demand pacing.  King airway in place.  Assisted ventilation.   After initial assessment and management, family arrived who reported that the patient has been having intermittent abdominal discomfort that was evaluated by PCP a few days ago.  His abdomen was benign during that time.  Tonight patient was found in his room on his knees complaining of shortness of breath with his nasal cannula off.  Family member put the nasal cannula back on and turn the oxygen up.  Family member lifted the patient and placed him on the bed.  When they tried to roll him over they noted that he had agonal breathing.  They called 911 who instructed them to start CPR which they did.  The history is provided by the EMS personnel and a relative.    Past Medical History Past Medical History:  Diagnosis Date   Anxiety    Asthma    Chronic diastolic CHF (congestive heart failure) (HCC)    diastolic    COPD (chronic obstructive pulmonary disease) (HCC)    Coronary artery disease    s/p PCI of RCA 03/2009 at which time there was 70% ramus and 90% RCA, cath 01/2011 showed patent stents in RCA with moderate prox disesae, aneurysmal left circ and small 90% ramus s/p PCI, patent LAD EF 55%   Diabetes mellitus     Diverticulitis Oct. 2013   bleeding in the past and Effient for his CAD was stopped   DVT (deep venous thrombosis) (HCC)    GERD (gastroesophageal reflux disease)    Hypercholesterolemia    Hypertension    Hypothyroidism    Obesity (BMI 30-39.9)    OSA (obstructive sleep apnea)    severe on CPAP   Peripheral vascular disease ([place]) 11/2006   s/p left stent   Shortness of breath    Sleep apnea    severe OSA awaiting CPAP titration   Patient Active Problem List   Diagnosis Date Noted   Goals of care, counseling/discussion 06/06/2022   Allergic rhinitis 28/31/5176   Acute metabolic encephalopathy 16/04/3709   Chronic respiratory failure with hypoxia ([place]) 06/11/2021   Sepsis ([place]) 04/01/2019   Acute lower GI bleeding 09/03/2018   Diabetes mellitus type II, non insulin dependent ([place]) 09/03/2018   Hyperkalemia 09/03/2018   Hypertensive urgency    COPD (chronic obstructive pulmonary disease) ([place]) 11/14/2017   Influenza A 11/13/2017   Mild renal insufficiency    LLL pneumonia 05/22/2017   History of DVT of lower extremity 05/22/2017   COPD exacerbation ([place]) 09/22/2016   SIRS (systemic inflammatory response syndrome) ([place]) 09/22/2016   Dyspnea 11/16/2014   Hypoxia 11/16/2014   Aftercare following surgery of the circulatory system 08/03/2014   Chronic diastolic CHF (congestive heart failure) ([place]) 06/21/2014   Edema of extremities 06/21/2014   COPD GOLD III 12/27/2013   Coronary  artery disease    OSA (obstructive sleep apnea)    Obesity (BMI 30-39.9)    Weakness of left leg 11/20/2012   Left thigh pain 11/20/2012   PVD (peripheral vascular disease) ([place]) 04/01/2012   WEIGHT GAIN, ABNORMAL 12/07/2009   Dyslipidemia 11/21/2009   HYPERTENSION, BENIGN 11/21/2009   DEEP VENOUS THROMBOPHLEBITIS, LEFT, LEG, HX OF 11/21/2009   Home Medication(s) Prior to Admission medications   Medication Sig Start Date End Date Taking? Authorizing Provider  albuterol (PROVENTIL HFA;VENTOLIN  HFA) 108 (90 BASE) MCG/ACT inhaler Inhale 2 puffs into the lungs every 6 (six) hours as needed for wheezing or shortness of breath.     [provider]  aspirin EC 81 MG tablet Take 81 mg by mouth daily.    [provider]  Budeson-Glycopyrrol-Formoterol (BREZTRI AEROSPHERE) 160-9-4.8 MCG/ACT AERO Inhale 2 puffs into the lungs in the morning and at bedtime.    [provider]  budesonide (PULMICORT) 0.5 MG/2ML nebulizer solution Take 2 mLs (0.5 mg total) by nebulization 2 (two) times daily. 03/29/22   Cobb, Karie Schwalbe, NP  ezetimibe (ZETIA) 10 MG tablet Take 1 tablet (10 mg total) by mouth daily. 07/23/21   Sueanne Margarita, MD  formoterol (PERFOROMIST) 20 MCG/2ML nebulizer solution Take 2 mLs (20 mcg total) by nebulization 2 (two) times daily. 03/29/22   Cobb, Karie Schwalbe, NP  glimepiride (AMARYL) 2 MG tablet Take 2 mg by mouth every evening.     [provider]  ipratropium (ATROVENT) 0.02 % nebulizer solution Take 2.5 mLs (0.5 mg total) by nebulization every 6 (six) hours as needed for wheezing or shortness of breath. 12/29/21   Oswald Hillock, MD  levalbuterol Penne Lash) 0.63 MG/3ML nebulizer solution Take 3 mLs (0.63 mg total) by nebulization every 6 (six) hours as needed for wheezing or shortness of breath. 12/29/21   Oswald Hillock, MD  levothyroxine (SYNTHROID, LEVOTHROID) 150 MCG tablet Take 150 mcg by mouth every evening.  05/19/17   [provider]  meloxicam (MOBIC) 15 MG tablet Take 15 mg by mouth daily as needed for pain. 03/30/19   [provider]  metoprolol succinate (TOPROL-XL) 50 MG 24 hr tablet Take 1 tablet (50 mg total) by mouth daily. Take with or immediately following a meal. 07/02/21   Turner, Eber Hong, MD  nitroGLYCERIN (NITROSTAT) 0.4 MG SL tablet Place 1 tablet (0.4 mg total) under the tongue every 5 (five) minutes as needed for chest pain. 07/06/20   Sueanne Margarita, MD  OXYGEN Inhale 3 application into the lungs. 3 liters n/c     [provider]  potassium chloride SA (KLOR-CON M) 20 MEQ tablet Take 1 tablet (20 mEq total) by mouth daily. 04/17/22   Cobb, Karie Schwalbe, NP  Roflumilast (DALIRESP) 250 MCG TABS Take 1 tablet by mouth daily. 07/15/22   Brand Males, MD  roflumilast (DALIRESP) 500 MCG TABS tablet Take 1 tablet (500 mcg total) by mouth daily. 07/15/22   Brand Males, MD  rosuvastatin (CRESTOR) 20 MG tablet Take 1 tablet (20 mg total) by mouth daily. 07/02/21   Sueanne Margarita, MD  sertraline (ZOLOFT) 50 MG tablet SMARTSIG:1 Tablet(s) By Mouth Every Evening 01/30/22   [provider]  Allergies Clopidogrel  Review of Systems Review of Systems As noted in HPI  Physical Exam Vital Signs  I have reviewed the triage vital signs BP (!) 176/91   Pulse 80   Temp (!) 95.4 F (35.2 C)   Resp 18   Ht '5\' 3"'$  (1.6 m)   Wt 76 kg   SpO2 100%   BMI 29.68 kg/m   Physical Exam Vitals reviewed.  Constitutional:      General: He is in acute distress.     Appearance: He is well-developed. He is toxic-appearing.     Interventions: He is sedated.  HENT:     Head: Normocephalic and atraumatic.     Comments: King airway    Nose: Nose normal.  Eyes:     General: No scleral icterus.       Right eye: No discharge.        Left eye: No discharge.     Conjunctiva/sclera: Conjunctivae normal.     Comments: Pin point pupils  Cardiovascular:     Rate and Rhythm: Normal rate. Rhythm regularly irregular.     Pulses:          Carotid pulses are 2+ on the right side.    Heart sounds: No murmur heard.    No friction rub. No gallop.  Pulmonary:     Effort: Pulmonary effort is normal. No respiratory distress.     Breath sounds: Normal breath sounds. No rales.  Abdominal:     General: There is no distension.     Palpations: Abdomen is soft.    Musculoskeletal:         General: No tenderness.     Cervical back: Normal range of motion and neck supple.  Skin:    General: Skin is warm and dry.     Findings: No erythema or rash.  Neurological:     Mental Status: He is unresponsive.     ED Results and Treatments Labs (all labs ordered are listed, but only abnormal results are displayed) Labs Reviewed  COMPREHENSIVE METABOLIC PANEL - Abnormal; Notable for the following components:      Result Value   CO2 19 (*)    Glucose, Bld 159 (*)    Total Protein 5.8 (*)    Albumin 3.3 (*)    AST 48 (*)    GFR, Estimated 57 (*)    Anion gap 19 (*)    All other components within normal limits  CBC WITH DIFFERENTIAL/PLATELET - Abnormal; Notable for the following components:   WBC 13.2 (*)    RBC 4.21 (*)    Hemoglobin 12.8 (*)    Platelets 113 (*)    Lymphs Abs 5.6 (*)    Eosinophils Absolute 0.6 (*)    Abs Immature Granulocytes 0.29 (*)    All other components within normal limits  PROTIME-INR - Abnormal; Notable for the following components:   Prothrombin Time 15.8 (*)    INR 1.3 (*)    All other components within normal limits  URINALYSIS, ROUTINE W REFLEX MICROSCOPIC - Abnormal; Notable for the following components:   APPearance HAZY (*)    Leukocytes,Ua TRACE (*)    Bacteria, UA RARE (*)    All other components within normal limits  I-STAT CHEM 8, ED - Abnormal; Notable for the following components:   Glucose, Bld 157 (*)    All other components within normal limits  I-STAT VENOUS BLOOD GAS, ED - Abnormal; Notable for the following components:   pH,  Ven 7.191 (*)    pCO2, Ven 64.8 (*)    pO2, Ven 149 (*)    Acid-base deficit 5.0 (*)    All other components within normal limits  BRAIN NATRIURETIC PEPTIDE  LACTIC ACID, PLASMA  LACTIC ACID, PLASMA                                                                                                                         EKG  EKG Interpretation  Date/Time:  Saturday August 17 2022 02:21:18  EDT Ventricular Rate:  81 PR Interval:  195 QRS Duration: 128 QT Interval:  411 QTC Calculation: 478 R Axis:   249 Text Interpretation: Sinus or ectopic atrial rhythm RBBB and LAFB Abnormal T, consider ischemia, lateral leads Confirmed by Addison Lank [id number]) on 09/06/2022 2:44:54 AM       Radiology DG Chest Portable 1 View  Result Date: 09/09/2022 CLINICAL DATA:  Cardiac arrest. EXAM: PORTABLE CHEST 1 VIEW COMPARISON:  July 08, 2022 FINDINGS: Radiopaque defibrillator pads are seen. An endotracheal tube is in place with its distal tip approximately 3.8 cm from the carina. Limited visualization of the carina is noted. An enteric tube is also seen with its distal end extending below the level of the diaphragm. The heart size and mediastinal contours are within normal limits. Mild atelectasis is seen within the bilateral lung bases. There is no evidence of a pleural effusion or pneumothorax. Multilevel degenerative changes seen throughout the thoracic spine. IMPRESSION: 1. Endotracheal tube and orogastric tube positioning, as described above. 2. Mild bibasilar atelectasis. Electronically Signed   By: Virgina Norfolk M.D.   On: 08/16/2022 03:11    Medications Ordered in ED Medications  fentaNYL (SUBLIMAZE) injection 25 mcg (has no administration in time range)  fentaNYL (SUBLIMAZE) injection 25-100 mcg (has no administration in time range)  midazolam (VERSED) injection 1-2 mg (has no administration in time range)  EPINEPHrine (ADRENALIN) 5 mg in NS 250 mL (0.02 mg/mL) premix infusion (2 mcg/min Intravenous Rate/Dose Change 08/30/2022 0316)  0.9 %  sodium chloride infusion (has no administration in time range)  norepinephrine (LEVOPHED) '4mg'$  in 233m (0.016 mg/mL) premix infusion (has no administration in time range)  sodium chloride 0.9 % bolus 1,000 mL (1,000 mLs Intravenous New Bag/Given 08/23/2022 0247)    Followed by  sodium chloride 0.9 % bolus 1,000 mL (1,000 mLs Intravenous New  Bag/Given 08/23/2022 0247)    Followed by  0.9 %  sodium chloride infusion (has no administration in time range)  Procedures Procedure Name: Intubation Date/Time: 08/27/2022 3:19 AM  Performed by: Fatima Blank, MDPre-anesthesia Checklist: Patient identified, Patient being monitored, Emergency Drugs available, Timeout performed and Suction available Oxygen Delivery Method: Ambu bag Preoxygenation: Pre-oxygenation with 100% oxygen Induction Type: Rapid sequence Ventilation: Oral airway inserted - appropriate to patient size Laryngoscope Size: Glidescope Grade View: Grade I Tube size: 7.5 mm Number of attempts: 1 Airway Equipment and Method: Rigid stylet Placement Confirmation: ETT inserted through vocal cords under direct vision, CO2 detector and Breath sounds checked- equal and bilateral Secured at: 25 cm Tube secured with: ETT holder    .Critical Care  Performed by: Fatima Blank, MD Authorized by: Fatima Blank, MD   Critical care provider statement:    Critical care time (minutes):  45   Critical care time was exclusive of:  Separately billable procedures and treating other patients   Critical care was necessary to treat or prevent imminent or life-threatening deterioration of the following conditions:  Cardiac failure   Critical care was time spent personally by me on the following activities:  Development of treatment plan with patient or surrogate, discussions with consultants, evaluation of patient's response to treatment, examination of patient, obtaining history from patient or surrogate, review of old charts, re-evaluation of patient's condition, pulse oximetry, ordering and review of radiographic studies, ordering and review of laboratory studies and ordering and performing treatments and interventions   Care discussed  with: admitting provider     (including critical care time)  Medical Decision Making / ED Course   Medical Decision Making Amount and/or Complexity of Data Reviewed Labs: ordered. Decision-making details documented in ED Course. Radiology: ordered and independent interpretation performed. Decision-making details documented in ED Course. ECG/medicine tests: ordered and independent interpretation performed. Decision-making details documented in ED Course.  Risk Prescription drug management. Parenteral controlled substances. Drug therapy requiring intensive monitoring for toxicity. Decision regarding hospitalization. Diagnosis or treatment significantly limited by social determinants of health. Risk Details: Spoke with family about goals of care. Full code for now. No prolonged mechanical ventilation requested.    Post CPR Witnessed with bystander CPR initiation. ROSC obtained Currently on 2 mics of epi King airway in place and exchanged for ET tube.  Patient required additional epi dosing and IV fluid boluses for hypotension.  Differential diagnosis is broad at this time.  Given past medical history will need to rule out severe COPD exacerbation, heart failure, PE, ACS.  We will need to assess for any electrolyte or metabolic derangements.  Will assess for infectious process.  Spoke with family and noted above.  Initial labs notable for respiratory acidosis. No significant electrolyte derangements or renal sufficiency on i-STAT Chem-8 CBC with leukocytosis.  No anemia.  Chest x-ray without evidence of pneumonia, pneumothorax, pulmonary edema or pleural effusions. ET tube and OG tube in appropriate positions.  3:23 AM : Consulted and spoke with Dr. Ruthann Cancer from critical care who agreed to evaluate the patient in the emergency department, admit for further work-up and management.      Final Clinical Impression(s) / ED Diagnoses Final diagnoses:  Cardiac arrest [facility])            This chart was dictated using voice recognition software.  Despite best efforts to proofread,  errors can occur which can change the documentation meaning.       Fatima Blank, MD 08/29/2022 [phone]

## 2022-08-17 NOTE — ED Notes (Addendum)
Pt taken to CT with this RN, Hal Hope RN, and RT

## 2022-08-17 NOTE — ED Notes (Signed)
Pt with BM - Pt changed into clean brief and clean chuck placed under pt

## 2022-08-17 NOTE — Progress Notes (Signed)
RT transported pt. To CT via vent with no incident

## 2022-08-17 NOTE — ED Notes (Signed)
Pt BP 77/54 - Epi gtt titrated up

## 2022-08-17 NOTE — ED Notes (Signed)
Per MD Cardama give pt fluid bolus and cont titrating epi gtt if needed - if epi gtt titrated up to 10 can start additional levo

## 2022-08-17 NOTE — ED Notes (Signed)
Critical care PA at bedside  Pt daughters at bedside

## 2022-08-17 NOTE — ED Notes (Signed)
Per MD Cardama get epi gtt at 2 right now

## 2022-08-17 NOTE — Progress Notes (Signed)
Pharmacy Antibiotic Note  [name] is a 88 y.o. male admitted on 09/07/2022 s/p cardiac arrest, possible sepsis.  Pharmacy has been consulted for Vancomycin and Cefepime dosing.  Plan: Vancomycin 1250 mg IV now, then 1000 mg IV q48h Cefepime 2 g IV q12h  Height: '5\' 3"'$  (160 cm) Weight: 76 kg (167 lb 8.8 oz) IBW/kg (Calculated) : 56.9  Temp (24hrs), Avg:94.8 F (34.9 C), Min:93.9 F (34.4 C), Max:95.4 F (35.2 C)  Recent Labs  Lab 09/08/2022 0220 08/14/2022 0226  WBC 13.2*  --   CREATININE 1.20 1.00  LATICACIDVEN 7.6*  --     Estimated Creatinine Clearance: 44.8 mL/min (by C-G formula based on SCr of 1 mg/dL).    Allergies  Allergen Reactions   Clopidogrel Itching      [name] 08/30/2022 4:58 AM

## 2022-08-17 NOTE — H&P (Addendum)
NAME:  [name], MRN:  [id number], DOB:  October 12, 1930, LOS: 0 ADMISSION DATE:  08/31/2022, CONSULTATION DATE:  11/4 REFERRING MD:  Dr. Leonette Monarch, CHIEF COMPLAINT:  cardiac arrest   History of Present Illness:  Patient is a 88 year old male with pertinent PMH of COPD on 3 LNC (seen by Dr. Chase Caller outpatient), diastolic CHF, HTN, HLD, CAD with history of 3 stents placed, prior DVTs not on any [facility] presents to [facility] ED on 11/4 s/p cardiac arrest.  Patient recently seen in [facility] ED on 9/25 for COPD exacerbation and was sent home on steroids.  Was started on Roflumilast outpatient by Dr. Chase Caller.  Later in October patient with some generalized abdominal pain was seen by PCP but exam was benign.  On 11/4, family witnessed patient having SOB on knees.  They placed him back on oxygen and try to get him up in bed.  Patient then became unresponsive and agonal breathing and EMS called.  CPR started by daughter.  Unknown amount of time before fire department arrival.  Fire department continued CPR and placed [facility] airway.  ROSC obtained 10 minutes after fire department arrival.  Was placed on epi drip and required intermittent pacing for bradycardia in route to [facility] ED.  Upon arrival to [facility] ED, patient on epi drip.  King airway exchanged for ETT.  EKG with no ST elevation.  CXR showing ETT in good position with mild atelectasis bilaterally.  Patient hypothermic 94.7 F.  CBG 157.  Patient given IV fluids.  Initial ABG 7.19, 64, 149, 25.  AG 19.  LA 7.6.  WBC 13.2, Hgb 12.8, platelets 113.  BNP 90.2.  UA rare bacteria and leukocytes.  CTA chest performed; results pending.  PCCM consulted for ICU admission.  Pertinent  Medical History   Past Medical History:  Diagnosis Date   Anxiety    Asthma    Chronic diastolic CHF (congestive heart failure) (HCC)    diastolic    COPD (chronic obstructive pulmonary disease) (HCC)    Coronary artery disease    s/p PCI of RCA 03/2009 at which time there was 70% ramus and 90%  RCA, cath 01/2011 showed patent stents in RCA with moderate prox disesae, aneurysmal left circ and small 90% ramus s/p PCI, patent LAD EF 55%   Diabetes mellitus    Diverticulitis Oct. 2013   bleeding in the past and Effient for his CAD was stopped   DVT (deep venous thrombosis) (HCC)    GERD (gastroesophageal reflux disease)    Hypercholesterolemia    Hypertension    Hypothyroidism    Obesity (BMI 30-39.9)    OSA (obstructive sleep apnea)    severe on CPAP   Peripheral vascular disease ([place]) 11/2006   s/p left stent   Shortness of breath    Sleep apnea    severe OSA awaiting CPAP titration     Significant Hospital Events: Including procedures, antibiotic start and stop dates in addition to other pertinent events   11/4 admitted to [facility] cardiac arrest and intubated  Interim History / Subjective:  On mech vent prvc Off pressors  Objective   Blood pressure (!) 176/91, pulse 80, temperature (!) 95.4 F (35.2 C), resp. rate 18, height '5\' 3"'$  (1.6 m), weight 76 kg, SpO2 100 %.    Vent Mode: PRVC FiO2 (%):  [100 %] 100 % Set Rate:  [18 bmp] 18 bmp Vt Set:  [450 mL] 450 mL PEEP:  [5 cmH20] 5 cmH20 Plateau Pressure:  [16 cmH20]  16 cmH20  No intake or output data in the 24 hours ending 08/24/2022 0318 Filed Weights   09/03/2022 0317  Weight: 76 kg    Examination: General:  critically ill appearing on mech vent HEENT: MM pink/moist; ETT in place Neuro: sedated; pupils 2 mm b/l sluggish to light CV: s1s2, RRR, no m/r/g PULM:  ex wheezing/rhonchi BS bilaterally ; on mech vent PRVC GI: soft, bsx4 active  Extremities: warm/dry, ble edema L > R Skin: no rashes or lesions appreciated   Resolved Hospital Problem list     Assessment & Plan:  Cardiac arrest: Family member witnessed patient with agonal breathing and called EMS CPR was started by family; EMS arrived and additional 10 minutes of CPR until ROSC obtained; requiring intermittent pacing for bradycardia P: -will admit to  ICU w/ continuous telemetry monitoring -currently off pressors; MAP goal >65 -IV fluids -trend troponin and lactate -check Mag -echo -CT head -CTA chest and dvt US pending -send UDS, ethanol, salicylate, acetaminophen level -check cultures: bcx2, urine culture/UA, and trach culture -will start on cefepime/vanc/flagyl -check procalcitonin -TTM normothermia protocol in place  Acute on chronic respiratory failure with hypercapnia COPD: seen by Dr. Chase Caller; on 3L [place]; on Breztri Asthma OSA: refuses cpap P: -LTVV strategy with tidal volumes of 6-8 cc/kg ideal body weight -RR increased to 26; check ABG in 2 hours -Goal plateau pressures less than 30 and driving pressures less than 15 -Wean PEEP/FiO2 for SpO2 >90% -iv steroids -iv mag -duoneb and pulmicort scheduled -resume home roflumilast -VAP bundle in place -Daily SAT and SBT when appropriate -PAD protocol in place -wean sedation for RASS goal 0 to -1  Sepsis Hypothermia Possible UTI: Trace leukocytes and bacteria in UA Abdominal pain: few days ago had abdominal pain? P: -bare hugger in place for normothermia -check cultures: bcx2, urine culture/UA, and trach culture -will start on cefepime/vanc/flagyl -check procalcitonin -trend wbc/fever curve -ct abd/pelvis pending; check lipase  Acute encephalopathy P: -Likely hypercarbia related; adjust vent settings as above -CT head pending -Consider EEG if no improvement in mental status -Limit sedating meds -Continue antibiotics; check PCT; UC pending -send UDS, ethanol, salicylate, acetaminophen level -Check TSH and ammonia  L>R LE Edema Prior DVTs: Not on AC P: -cta chest pending -dvt US ordered  Thrombocytopenia Mild anemia P: -Trend CBC  CAD: History of previous stents Chronic diastolic HF: Last echo 10/4479: LVEF 55 to 85%; grade 1 diastolic dysfunction HTN HLD P: -hold home anti-hypertensives -resume crestor and zetia -consider resuming asa pending  ct head results  DMT2 P: -check a1c -SSI and CBG monitoring  Hypothyroidism P: -check tsh -resume synthroid  Depression P: -resume sertraline    Best Practice (right click and "Reselect all SmartList Selections" daily)   Diet/type: NPO w/ meds via tube DVT prophylaxis: SCD; awaiting CT head results GI prophylaxis: H2B Lines: N/A Foley:  Yes, and it is still needed Code Status:  full code Last date of multidisciplinary goals of care discussion [11/4 spoke with daughters at bedside and they would like patient to remain full code at this time. They state that patient would not want to be on vent long term.]  Labs   CBC: Recent Labs  Lab 08/25/2022 0220 08/25/2022 0226  WBC 13.2*  --   NEUTROABS 5.8  --   HGB 12.8* 13.3  13.6  HCT 40.7 39.0  40.0  MCV 96.7  --   PLT 113*  --     Basic Metabolic Panel: Recent Labs  Lab  08/16/2022 0220 09/03/2022 0226  NA 139 136  136  K 4.7 4.5  4.5  CL 101 99  CO2 19*  --   GLUCOSE 159* 157*  BUN 11 13  CREATININE 1.20 1.00  CALCIUM 9.4  --    GFR: Estimated Creatinine Clearance: 44.8 mL/min (by C-G formula based on SCr of 1 mg/dL). Recent Labs  Lab 08/28/2022 0220  WBC 13.2*    Liver Function Tests: Recent Labs  Lab 09/02/2022 0220  AST 48*  ALT 25  ALKPHOS 39  BILITOT 0.8  PROT 5.8*  ALBUMIN 3.3*   No results for input(s): "LIPASE", "AMYLASE" in the last 168 hours. No results for input(s): "AMMONIA" in the last 168 hours.  ABG    Component Value Date/Time   PHART 7.352 11/16/2014 2255   PCO2ART 52.4 (H) 11/16/2014 2255   PO2ART 67.0 (L) 11/16/2014 2255   HCO3 24.8 09/10/2022 0226   TCO2 25 09/06/2022 0226   TCO2 27 08/23/2022 0226   ACIDBASEDEF 5.0 (H) 08/31/2022 0226   O2SAT 99 08/16/2022 0226     Coagulation Profile: Recent Labs  Lab 09/02/2022 0220  INR 1.3*    Cardiac Enzymes: No results for input(s): "CKTOTAL", "CKMB", "CKMBINDEX", "TROPONINI" in the last 168 hours.  HbA1C: Hgb A1c MFr Bld   Date/Time Value Ref Range Status  12/27/2021 06:50 PM 6.7 (H) 4.8 - 5.6 % Final    Comment:    (NOTE)         Prediabetes: 5.7 - 6.4         Diabetes: >6.4         Glycemic control for adults with diabetes: <7.0   05/23/2017 05:54 AM 6.9 (H) 4.8 - 5.6 % Final    Comment:    (NOTE) Pre diabetes:          5.7%-6.4% Diabetes:              >6.4% Glycemic control for   <7.0% adults with diabetes     CBG: No results for input(s): "GLUCAP" in the last 168 hours.  Review of Systems:   Patient is encephalopathic and/or intubated. Therefore history has been obtained from chart review.    Past Medical History:  He,  has a past medical history of Anxiety, Asthma, Chronic diastolic CHF (congestive heart failure) ([place]), COPD (chronic obstructive pulmonary disease) ([place]), Coronary artery disease, Diabetes mellitus, Diverticulitis (Oct. 2013), DVT (deep venous thrombosis) ([place]), GERD (gastroesophageal reflux disease), Hypercholesterolemia, Hypertension, Hypothyroidism, Obesity (BMI 30-39.9), OSA (obstructive sleep apnea), Peripheral vascular disease ([place]) (11/2006), Shortness of breath, and Sleep apnea.   Surgical History:   Past Surgical History:  Procedure Laterality Date   ABDOMINAL AORTAGRAM N/A 04/20/2012   Procedure: ABDOMINAL Maxcine Ham;  Surgeon: Angelia Mould, MD;  Location: [facility] CATH LAB;  Service: Cardiovascular;  Laterality: N/A;   ABDOMINAL AORTAGRAM N/A 12/29/2012   Procedure: ABDOMINAL Maxcine Ham;  Surgeon: Serafina Mitchell, MD;  Location: [facility] CATH LAB;  Service: Cardiovascular;  Laterality: N/A;   ABDOMINAL AORTOGRAM W/LOWER EXTREMITY Bilateral 03/16/2021   Procedure: ABDOMINAL AORTOGRAM W/LOWER EXTREMITY;  Surgeon: Angelia Mould, MD;  Location: [facility] CV LAB;  Service: Cardiovascular;  Laterality: Bilateral;   BALLOON DILATION  12/26/2011   Procedure: BALLOON DILATION;  Surgeon: Garlan Fair, MD;  Location: [facility] ENDOSCOPY;  Service: Endoscopy;  Laterality: N/A;    COLONOSCOPY  08/14/2012   Procedure: COLONOSCOPY;  Surgeon: Arta Silence, MD;  Location: [facility] ENDOSCOPY;  Service: Endoscopy;  Laterality: N/A;   CORONARY STENT  PLACEMENT  2010 and 2012   ESOPHAGOGASTRODUODENOSCOPY  12/26/2011   Procedure: ESOPHAGOGASTRODUODENOSCOPY (EGD);  Surgeon: Garlan Fair, MD;  Location: [facility] ENDOS[facility];  Service: Endoscopy;  Laterality: N/A;   ESOPHAGOGASTRODUODENOSCOPY  08/13/2012   Procedure: ESOPHAGOGASTRODUODENOSCOPY (EGD);  Surgeon: Arta Silence, MD;  Location: [facility] ENDOSCOPY;  Service: Endoscopy;  Laterality: Left;   HERNIA REPAIR  1992   evntral hernia repair   LOWER EXTREMITY ANGIOGRAM Bilateral 12/29/2012   Procedure: LOWER EXTREMITY ANGIOGRAM;  Surgeon: Serafina Mitchell, MD;  Location: [facility] CATH LAB;  Service: Cardiovascular;  Laterality: Bilateral;  bilat lower extrem angio     Social History:   reports that he quit smoking about 58 years ago. His smoking use included cigarettes and cigars. He has a 7.50 pack-year smoking history. He has never used smokeless tobacco. He reports that he does not drink alcohol and does not use drugs.   Family History:  His family history includes Asthma in his sister; Breast cancer in his sister; Diabetes in his brother, mother, and sister; Heart attack in his mother and sister; Heart disease in his father, mother, and sister; Hyperlipidemia in his mother, sister, and sister; Hypertension in his mother, sister, and sister. There is no history of Malignant hyperthermia.   Allergies Allergies  Allergen Reactions   Clopidogrel Itching     Home Medications  Prior to Admission medications   Medication Sig Start Date End Date Taking? Authorizing Provider  albuterol (PROVENTIL HFA;VENTOLIN HFA) 108 (90 BASE) MCG/ACT inhaler Inhale 2 puffs into the lungs every 6 (six) hours as needed for wheezing or shortness of breath.     [provider]  aspirin EC 81 MG tablet Take 81 mg by mouth daily.    [provider]   Budeson-Glycopyrrol-Formoterol (BREZTRI AEROSPHERE) 160-9-4.8 MCG/ACT AERO Inhale 2 puffs into the lungs in the morning and at bedtime.    [provider]  budesonide (PULMICORT) 0.5 MG/2ML nebulizer solution Take 2 mLs (0.5 mg total) by nebulization 2 (two) times daily. 03/29/22   Cobb, Karie Schwalbe, NP  ezetimibe (ZETIA) 10 MG tablet Take 1 tablet (10 mg total) by mouth daily. 07/23/21   Sueanne Margarita, MD  formoterol (PERFOROMIST) 20 MCG/2ML nebulizer solution Take 2 mLs (20 mcg total) by nebulization 2 (two) times daily. 03/29/22   Cobb, Karie Schwalbe, NP  glimepiride (AMARYL) 2 MG tablet Take 2 mg by mouth every evening.     [provider]  ipratropium (ATROVENT) 0.02 % nebulizer solution Take 2.5 mLs (0.5 mg total) by nebulization every 6 (six) hours as needed for wheezing or shortness of breath. 12/29/21   Oswald Hillock, MD  levalbuterol Penne Lash) 0.63 MG/3ML nebulizer solution Take 3 mLs (0.63 mg total) by nebulization every 6 (six) hours as needed for wheezing or shortness of breath. 12/29/21   Oswald Hillock, MD  levothyroxine (SYNTHROID, LEVOTHROID) 150 MCG tablet Take 150 mcg by mouth every evening.  05/19/17   [provider]  meloxicam (MOBIC) 15 MG tablet Take 15 mg by mouth daily as needed for pain. 03/30/19   [provider]  metoprolol succinate (TOPROL-XL) 50 MG 24 hr tablet Take 1 tablet (50 mg total) by mouth daily. Take with or immediately following a meal. 07/02/21   Turner, Eber Hong, MD  nitroGLYCERIN (NITROSTAT) 0.4 MG SL tablet Place 1 tablet (0.4 mg total) under the tongue every 5 (five) minutes as needed for chest pain. 07/06/20   Sueanne Margarita, MD  OXYGEN Inhale 3 application into  the lungs. 3 liters n/c    [provider]  potassium chloride SA (KLOR-CON M) 20 MEQ tablet Take 1 tablet (20 mEq total) by mouth daily. 04/17/22   Cobb, Karie Schwalbe, NP  Roflumilast (DALIRESP) 250 MCG TABS Take 1 tablet by mouth daily. 07/15/22   Brand Males, MD  roflumilast (DALIRESP) 500 MCG TABS tablet Take 1 tablet (500 mcg total) by mouth daily. 07/15/22   Brand Males, MD  rosuvastatin (CRESTOR) 20 MG tablet Take 1 tablet (20 mg total) by mouth daily. 07/02/21   Sueanne Margarita, MD  sertraline (ZOLOFT) 50 MG tablet SMARTSIG:1 Tablet(s) By Mouth Every Evening 01/30/22   [provider]     Critical care time: 45 minutes    JD Rexene Agent  Pulmonary & Critical Care 08/19/2022, 3:18 AM  Please see [email or web address] for pager details.  From 7A-7P if no response, please call [phone]. After hours, please call ELink [phone].

## 2022-08-17 NOTE — Progress Notes (Signed)
Pt. Intubated by Md Rt assisted with 7.5 tube 25'@lip'$  with no complications positive color change on capo. With bilateral breath sounds chest xray to follow vbg to follow .

## 2022-08-17 NOTE — ED Notes (Signed)
CT called and will be ready for this patient in 27mn - RT notified

## 2022-08-17 NOTE — ED Notes (Signed)
IV Team at bedside 

## 2022-08-17 NOTE — ED Notes (Signed)
.   5 epi given

## 2022-08-17 NOTE — Progress Notes (Signed)
Rt transported pt from ED to 80M without event.

## 2022-08-17 NOTE — Progress Notes (Signed)
   08/31/2022 0355  Clinical Encounter Type  Visited With Family  Visit Type Initial;Spiritual support;Social support  Referral From Nurse  Consult/Referral To Chaplain   Chaplain responded to a request for support of the family of the patient, [name]. His children were present as well as other family members. I supported the family as a liaison between them and the medical team. The doctor and nurse provided answered their questions raised and invited continued dialogue. The family is close and working together to notify other family members in the morning and supporting one another. When [name] was moved up to the 3rd floor one family member stayed with him and I escorted [name] the floor and made sure she was comfortable in the waiting area.  Family plans to return over the course of Saturday.   [name]  [facility]  [phone]

## 2022-08-17 NOTE — Progress Notes (Signed)
Lower extremity venous bilateral study completed.   Please see CV Proc for preliminary results.   Rodolfo Gaster, RDMS, RVT  

## 2022-08-17 NOTE — Progress Notes (Signed)
Pharmacy Antibiotic Note  MINOR IDEN is a 88 y.o. male admitted on 09/04/2022 s/p cardiac arrest, possible sepsis.  Pharmacy has been consulted for Vancomycin and Cefepime dosing. Also on Flagyl per MD. SCr stable at 1.08. Received vancomycin loading dose this morning.  Plan: Adjust vancomycin to 1g IV q24h. Goal AUC 400-550. Expected AUC: 252 SCr used: 1.08 Cefepime 2g IV q12h Flagyl '500mg'$  IV q12h Monitor clinical progress, c/s, renal function F/u de-escalation plan/LOT, vancomycin levels as indicated   Height: '5\' 3"'$  (160 cm) Weight: 76 kg (167 lb 8.8 oz) IBW/kg (Calculated) : 56.9  Temp (24hrs), Avg:95.9 F (35.5 C), Min:93.9 F (34.4 C), Max:97.9 F (36.6 C)  Recent Labs  Lab 09/06/2022 0220 08/31/2022 0226 08/14/2022 0439 09/07/2022 0647  WBC 13.2*  --   --   --   CREATININE 1.20 1.00 1.08  --   LATICACIDVEN 7.6*  --  4.2* 4.4*     Estimated Creatinine Clearance: 41.5 mL/min (by C-G formula based on SCr of 1.08 mg/dL).    Allergies  Allergen Reactions   Clopidogrel Itching     Arturo Morton, PharmD, BCPS Please check AMION for all [place] contact numbers Clinical Pharmacist 08/28/2022 11:49 AM

## 2022-08-17 NOTE — Progress Notes (Addendum)
NAME:  [name], MRN:  [id number], DOB:  Jan 04, 1930, LOS: 0 ADMISSION DATE:  08/27/2022, CONSULTATION DATE:  11/4 REFERRING MD:  Dr. Leonette Monarch, CHIEF COMPLAINT:  cardiac arrest   History of Present Illness:  Patient is a 88 year old male with pertinent PMH of COPD on 3 LNC (seen by Dr. Chase Caller outpatient), diastolic CHF, HTN, HLD, CAD with history of 3 stents placed, prior DVTs not on any [facility] presents to [facility] ED on 11/4 s/p cardiac arrest.  Patient recently seen in [facility] ED on 9/25 for COPD exacerbation and was sent home on steroids.  Was started on Roflumilast outpatient by Dr. Chase Caller.  Later in October patient with some generalized abdominal pain was seen by PCP but exam was benign.  On 11/4, family witnessed patient having SOB on knees.  They placed him back on oxygen and try to get him up in bed.  Patient then became unresponsive and agonal breathing and EMS called.  CPR started by daughter.  Unknown amount of time before fire department arrival.  Fire department continued CPR and placed [facility] airway.  ROSC obtained 10 minutes after fire department arrival.  Was placed on epi drip and required intermittent pacing for bradycardia in route to [facility] ED.  Upon arrival to [facility] ED, patient on epi drip.  King airway exchanged for ETT.  EKG with no ST elevation.  CXR showing ETT in good position with mild atelectasis bilaterally.  Patient hypothermic 94.7 F.  CBG 157.  Patient given IV fluids.  Initial ABG 7.19, 64, 149, 25.  AG 19.  LA 7.6.  WBC 13.2, Hgb 12.8, platelets 113.  BNP 90.2.  UA rare bacteria and leukocytes.  CTA chest performed; results pending.  PCCM consulted for ICU admission.  Pertinent  Medical History   Past Medical History:  Diagnosis Date   Anxiety    Asthma    Chronic diastolic CHF (congestive heart failure) (HCC)    diastolic    COPD (chronic obstructive pulmonary disease) (HCC)    Coronary artery disease    s/p PCI of RCA 03/2009 at which time there was 70% ramus and 90%  RCA, cath 01/2011 showed patent stents in RCA with moderate prox disesae, aneurysmal left circ and small 90% ramus s/p PCI, patent LAD EF 55%   Diabetes mellitus    Diverticulitis Oct. 2013   bleeding in the past and Effient for his CAD was stopped   DVT (deep venous thrombosis) (HCC)    GERD (gastroesophageal reflux disease)    Hypercholesterolemia    Hypertension    Hypothyroidism    Obesity (BMI 30-39.9)    OSA (obstructive sleep apnea)    severe on CPAP   Peripheral vascular disease ([place]) 11/2006   s/p left stent   Shortness of breath    Sleep apnea    severe OSA awaiting CPAP titration     Significant Hospital Events: Including procedures, antibiotic start and stop dates in addition to other pertinent events   11/4 admitted to [facility] cardiac arrest and intubated. CT chest neg PE, marked emphysema, mid lingular and RML ATX, Acute anterior second, third, fourth and fifth left rib fractures. Acute nondisplaced fracture deformity along the anterior aspect Marked severity coronary artery disease.  Interim History / Subjective:  Back on pressors   Objective   Blood pressure (!) 176/91, pulse 80, temperature (!) 95.4 F (35.2 C), resp. rate 18, height '5\' 3"'$  (1.6 m), weight 76 kg, SpO2 100 %.    Vent Mode: PRVC  FiO2 (%):  [100 %] 100 % Set Rate:  [18 bmp] 18 bmp Vt Set:  [450 mL] 450 mL PEEP:  [5 cmH20] 5 cmH20 Plateau Pressure:  [16 cmH20] 16 cmH20  No intake or output data in the 24 hours ending 09/01/2022 0318 Filed Weights   08/27/2022 0317  Weight: 76 kg    Examination: General chronically ill appearing 70 yom currently on full vent support w/ intermittent myoclonus HENT NCAT no JVD orally intubated Pulm coarse scattered rhonchi Card rrr Abd soft  Ext warm and dry  Neuro having intermittent myoclonus otherwise not responsive.  GU cl yellow    Resolved Hospital Problem list     Assessment & Plan:  Cardiac arrest: Family member witnessed patient with agonal breathing  and called EMS CPR was started by family; EMS arrived and additional 10 minutes of CPR until ROSC obtained; requiring intermittent pacing for bradycardia P: Cont tele  F/u echo  DNR   Acute on chronic respiratory failure with hypercapnia COPD: seen by Dr. Chase Caller; on 3L [place]; on Breztri Asthma Multiple rib fractures  OSA: refuses cpap P: Cont full vent support. Ve changed (we were over ventilating) VAP bundle  PAD protocol RASS goal 0 to -1 Cont BDs Cont roflumist Cont systemic steroids   Septic shock w/ lactic acidosis  Hypothermia Possible UTI: Trace leukocytes and bacteria in UA Abdominal pain: few days ago had abdominal pain? P: Cont IVFs Titrate NE for MAP > 65 Day 1 abx F/u cultures   Acute encephalopathy w/ Myoclonus  -initially felt HCRF but did have at least 10 min to ROSC. I am worried about HIE Plan F/u CT haed EEG  Minimize sedation  Cont lactulose   L>R LE Edema Prior DVTs: Not on AC P: F/u LE [place]   Thrombocytopenia Mild anemia P: Trend  Transfuse for hgb < 7  CAD: History of previous stents Chronic diastolic HF: Last echo 12/5359: LVEF 55 to 44%; grade 1 diastolic dysfunction HTN HLD P: Holding home anti-hypertensives Cont crestor and zetia hold home anti-hypertensives  DMT2 P: SSI goal 140-180   Hypothyroidism P: Cont synthroid   Depression P: Cont sertraline     Best Practice (right click and "Reselect all SmartList Selections" daily)   Diet/type: NPO w/ meds via tube DVT prophylaxis: SCD; awaiting CT head results GI prophylaxis: H2B Lines: N/A Foley:  Yes, and it is still needed Code Status:  DNR Last date of multidisciplinary goals of care discussion [11/4 spoke with daughters at bedside and they would like patient to remain full code at this time. They state that patient would not want to be on vent long term. Made DNR after discussion w/ daughter [name] this am ]  a  Critical care time: 30 min      Erick Colace  ACNP-BC [place] Pager # [phone] OR # [phone] if no answer

## 2022-08-18 DIAGNOSIS — G9340 Encephalopathy, unspecified: Secondary | ICD-10-CM | POA: Diagnosis not present

## 2022-08-18 DIAGNOSIS — R569 Unspecified convulsions: Secondary | ICD-10-CM | POA: Diagnosis not present

## 2022-08-18 DIAGNOSIS — I469 Cardiac arrest, cause unspecified: Secondary | ICD-10-CM | POA: Diagnosis not present

## 2022-08-18 LAB — URINE CULTURE: Culture: NO GROWTH

## 2022-08-18 LAB — GLUCOSE, CAPILLARY: Glucose-Capillary: 178 mg/dL — ABNORMAL HIGH (ref 70–99)

## 2022-08-18 MED ORDER — MORPHINE 100MG IN NS 100ML (1MG/ML) PREMIX INFUSION
0.0000 mg/h | INTRAVENOUS | Status: DC
  Administered 2022-08-18: 5 mg/h via INTRAVENOUS
  Filled 2022-08-18: qty 100

## 2022-08-18 MED ORDER — GLYCOPYRROLATE 0.2 MG/ML IJ SOLN: 0.2000 mg | INTRAMUSCULAR | Status: DC | PRN

## 2022-08-18 MED ORDER — SODIUM CHLORIDE 0.9 % IV SOLN: INTRAVENOUS | Status: DC

## 2022-08-18 MED ORDER — POLYVINYL ALCOHOL 1.4 % OP SOLN: 1.0000 [drp] | Freq: Four times a day (QID) | OPHTHALMIC | Status: DC | PRN

## 2022-08-18 MED ORDER — MIDAZOLAM HCL 2 MG/2ML IJ SOLN: 1.0000 mg | INTRAMUSCULAR | Status: DC | PRN

## 2022-08-18 MED ORDER — GLYCOPYRROLATE 0.2 MG/ML IJ SOLN
0.2000 mg | INTRAMUSCULAR | Status: DC | PRN
  Administered 2022-08-18: 0.2 mg via INTRAVENOUS
  Filled 2022-08-18: qty 1

## 2022-08-18 MED ORDER — PROPOFOL 1000 MG/100ML IV EMUL
5.0000 ug/kg/min | INTRAVENOUS | Status: DC
  Administered 2022-08-18: 10 ug/kg/min via INTRAVENOUS

## 2022-08-18 MED ORDER — ATROPINE SULFATE 1 MG/10ML IJ SOSY
PREFILLED_SYRINGE | INTRAMUSCULAR | Status: AC
  Filled 2022-08-18: qty 10

## 2022-08-18 MED ORDER — MORPHINE BOLUS VIA INFUSION
5.0000 mg | INTRAVENOUS | Status: DC | PRN
  Administered 2022-08-18 (×3): 5 mg via INTRAVENOUS

## 2022-08-18 MED ORDER — ACETAMINOPHEN 650 MG RE SUPP: 650.0000 mg | Freq: Four times a day (QID) | RECTAL | Status: DC | PRN

## 2022-08-18 MED ORDER — VALPROATE SODIUM 100 MG/ML IV SOLN
1500.0000 mg | Freq: Once | INTRAVENOUS | Status: AC
  Administered 2022-08-18: 1500 mg via INTRAVENOUS
  Filled 2022-08-18: qty 15

## 2022-08-18 MED ORDER — PROPOFOL 1000 MG/100ML IV EMUL
INTRAVENOUS | Status: AC
  Filled 2022-08-18: qty 100

## 2022-08-18 MED ORDER — ACETAMINOPHEN 325 MG PO TABS: 650.0000 mg | ORAL_TABLET | Freq: Four times a day (QID) | ORAL | Status: DC | PRN

## 2022-08-18 MED ORDER — MIDAZOLAM BOLUS VIA INFUSION (WITHDRAWAL LIFE SUSTAINING TX)
2.0000 mg | INTRAVENOUS | Status: DC | PRN
  Administered 2022-08-18 (×2): 2 mg via INTRAVENOUS

## 2022-08-18 MED ORDER — GLYCOPYRROLATE 1 MG PO TABS: 1.0000 mg | ORAL_TABLET | ORAL | Status: DC | PRN

## 2022-08-18 MED ORDER — MIDAZOLAM-SODIUM CHLORIDE 100-0.9 MG/100ML-% IV SOLN
0.0000 mg/h | INTRAVENOUS | Status: DC
  Administered 2022-08-18: 2 mg/h via INTRAVENOUS
  Filled 2022-08-18: qty 100

## 2022-08-19 ENCOUNTER — Ambulatory Visit: Payer: Medicare HMO | Admitting: Nurse Practitioner

## 2022-08-19 LAB — GLUCOSE, CAPILLARY: Glucose-Capillary: 163 mg/dL — ABNORMAL HIGH (ref 70–99)

## 2022-08-22 LAB — CULTURE, BLOOD (ROUTINE X 2)
Culture: NO GROWTH
Culture: NO GROWTH
Special Requests: ADEQUATE

## 2022-08-23 ENCOUNTER — Ambulatory Visit: Payer: Medicare HMO | Admitting: Neurology

## 2022-09-13 NOTE — Death Summary Note (Signed)
DEATH SUMMARY   Patient Details  Name: [name] MRN: [id number] DOB: 11/27/1929  Admission/Discharge Information   Admit Date:  2022-09-04  Date of Death: Date of Death: September 05, 2022  Time of Death: Time of Death: 1427-01-12  Length of Stay: 1  Referring Physician: [name], MD   Reason(s) for Hospitalization  Cardiac arrest  Diagnoses  Preliminary cause of death:  Secondary Diagnoses (including complications and co-morbidities):  Principal Problem:   Cardiac arrest [facility]) Active Problems:   Acute on chronic respiratory failure with hypercapnia ([place])   Asthma   Acute encephalopathy   COPD with acute exacerbation [facility])   Brief Hospital Course (including significant findings, care, treatment, and services provided and events leading to death)  [name] is a 88 y.o. year old male  with pertinent PMH of COPD on 3 LNC, diastolic CHF, HTN, HLD, CAD with history of 3 stents placed, prior DVTs not on any AC presents to [facility] ED on Sep 05, 2023 s/p cardiac arrest.  On 05-Sep-2023, family witnessed patient having SOB on knees.  They placed him back on oxygen and try to get him up in bed.  Patient then became unresponsive and agonal breathing and EMS called.  CPR started by daughter.  Unknown amount of time before fire department arrival.  Fire department continued CPR and placed [facility] airway.  ROSC obtained 10 minutes after fire department arrival.  Was placed on epi drip and required intermittent pacing for bradycardia in route to [facility] ED.   Upon arrival to [facility] ED, patient on epi drip.  [facility] airway exchanged for ETT.  EKG with no ST elevation.  CXR showing ETT in good position with mild atelectasis bilaterally.  Patient hypothermic 94.7 F.  CBG 157.  Patient given IV fluids.  Initial ABG 7.19, 64, 149, 25.  AG 19.  LA 7.6.  WBC 13.2, Hgb 12.8, platelets 113.  BNP 90.2.  UA rare bacteria and leukocytes.  CTA chest performed; results pending.  PCCM consulted for ICU admission.  He was found to have  extensive myoclonus on arrival to ICU. EEG showed signs concerning for anoxic/hypoxic brain injury. Family talked with PCCM and neurology teams. Family elected to transition to confort measures given these findings. He passed away peacefully on 09/06/2023 with family at bedside.    Pertinent Labs and Studies  Significant Diagnostic Studies VAS [place] LOWER EXTREMITY VENOUS (DVT)  Result Date: 2022/09/05  Lower Venous DVT Study Patient Name:  [name]  Date of Exam:   04-Sep-2022 Medical Rec #: [id number]       Accession #:    [id number] Date of Birth: [birth year]       Patient Gender: M Patient Age:   85 years Exam Location:  [facility] Procedure:      VAS [place] LOWER EXTREMITY VENOUS (DVT) Referring Phys: Andres Labrum --------------------------------------------------------------------------------  Indications: Remote history of DVT in 01-11-2010, swelling, s/p cardiac arrest.  Comparison Study: Multiple prior studies. Most recent bilateral lower extremity                   venous performed 12-04-2020 was negative for DVT. Performing Technologist: Darlin Coco RDMS, RVT  Examination Guidelines: A complete evaluation includes B-mode imaging, spectral Doppler, color Doppler, and power Doppler as needed of all accessible portions of each vessel. Bilateral testing is considered an integral part of a complete examination. Limited examinations for reoccurring indications may be performed as noted. The reflux portion of the exam is performed with the patient  in reverse Trendelenburg.  +---------+---------------+---------+-----------+----------+--------------+ RIGHT    CompressibilityPhasicitySpontaneityPropertiesThrombus Aging +---------+---------------+---------+-----------+----------+--------------+ CFV      Full           Yes      Yes                                 +---------+---------------+---------+-----------+----------+--------------+ SFJ      Full                                                         +---------+---------------+---------+-----------+----------+--------------+ FV Prox  Full                                                        +---------+---------------+---------+-----------+----------+--------------+ FV Mid   Full                                                        +---------+---------------+---------+-----------+----------+--------------+ FV DistalFull                                                        +---------+---------------+---------+-----------+----------+--------------+ PFV      Full                                                        +---------+---------------+---------+-----------+----------+--------------+ POP      Full           Yes      Yes                                 +---------+---------------+---------+-----------+----------+--------------+ PTV      Full                                                        +---------+---------------+---------+-----------+----------+--------------+ PERO     Full                                                        +---------+---------------+---------+-----------+----------+--------------+   +--------+---------------+---------+-----------+----------+--------------------+ LEFT    CompressibilityPhasicitySpontaneityPropertiesThrombus Aging       +--------+---------------+---------+-----------+----------+--------------------+ CFV     Full           Yes      Yes                                       +--------+---------------+---------+-----------+----------+--------------------+  SFJ     Full                                                              +--------+---------------+---------+-----------+----------+--------------------+ FV Prox Full                                                              +--------+---------------+---------+-----------+----------+--------------------+ FV Mid  Full                                                               +--------+---------------+---------+-----------+----------+--------------------+ FV      Full                                                              Distal                                                                    +--------+---------------+---------+-----------+----------+--------------------+ PFV     Full                                                              +--------+---------------+---------+-----------+----------+--------------------+ POP                    Yes      Yes                  Patent by color                                                           doppler              +--------+---------------+---------+-----------+----------+--------------------+ PTV     Full                                                              +--------+---------------+---------+-----------+----------+--------------------+ PERO    Full                                                              +--------+---------------+---------+-----------+----------+--------------------+  Summary: RIGHT: - There is no evidence of deep vein thrombosis in the lower extremity.  - No cystic structure found in the popliteal fossa.  LEFT: - There is no evidence of deep vein thrombosis in the lower extremity.  - No cystic structure found in the popliteal fossa.  *See table(s) above for measurements and observations. Electronically signed by Orlie Pollen on 2022-08-29 at 1:45:16 PM.    Final    Overnight EEG with video  Result Date: 29-Aug-2022 Lora Havens, MD     08/29/22  9:00 AM Patient Name: [name] MRN: [id number] Epilepsy Attending: Lora Havens Referring Physician/Provider: Erick Colace, NP Duration: 08/25/2022 1618 to Aug 29, 2022 3888 Patient history: 88yo M s/p cardiac arrest. EEG to evaluate for seizure Level of alertness: comatose AEDs during EEG study: Versed, LEV, VPA Technical aspects: This EEG study was done with scalp  electrodes positioned according to the 10-20 International system of electrode placement. Electrical activity was reviewed with band pass filter of 1-'70Hz'$ , sensitivity of 7 uV/mm, display speed of 56m/sec with a '60Hz'$  notched filter applied as appropriate. EEG data were recorded continuously and digitally stored.  Video monitoring was available and reviewed as appropriate. Description: Att the beginning of study, eeg showed near continuous background suppression admixed with generalized poyspikes which gradually became more frequent and appeared periodic at 2-2.'5hz'$ . After around 2200,  generalized epileptiform bursts were noted which at times appeared rhythmic with evolution in morphology and frequency consistent with seizure. No clinical signs were noted. Hyperventilation and photic stimulation were not performed.   ABNORMALITY -Seizure without clinical signs, generalized -Burst suppression with highly epileptiform burst, generalized IMPRESSION: This study showed seizures without clinical signs with generalized onset. Additionally there was evidence of profound diffuse encephalopathy.  In the setting of cardiac arrest, this EEG pattern is most likely secondary to anoxic/hypoxic brain injury. PLora Havens  ECHOCARDIOGRAM COMPLETE  Result Date: 09/11/2022    ECHOCARDIOGRAM REPORT   Patient Name:   Eddie CLASBYDate of Exam: 09/10/2022 Medical Rec #:  [id number]     Height:       63.0 in Accession #:    [id number]    Weight:       167.5 lb Date of Birth:  510-26-1931     BSA:          1.793 m Patient Age:    89years       BP:           86/56 mmHg Patient Gender: M              HR:           106 bpm. Exam Location:  Inpatient Procedure: 2D Echo Indications:    Cardiac arrest I46.9  History:        Patient has prior history of Echocardiogram examinations. CHF.  Sonographer:    TDarlina SicilianReferring Phys: [id number] 1. Left ventricular ejection fraction, by estimation, is 60 to 65%.  The left ventricle has normal function. The left ventricle has no regional wall motion abnormalities. Left ventricular diastolic parameters are indeterminate.  2. Right ventricular systolic function is normal. The right ventricular size is normal. Tricuspid regurgitation signal is inadequate for assessing PA pressure.  3. The mitral valve is normal in structure. Trivial mitral valve regurgitation.  4. The aortic valve is tricuspid. Aortic valve regurgitation is not visualized. Aortic valve sclerosis/calcification is present, without any evidence of  aortic stenosis.  5. The inferior vena cava is normal in size with greater than 50% respiratory variability, suggesting right atrial pressure of 3 mmHg. FINDINGS  Left Ventricle: Left ventricular ejection fraction, by estimation, is 60 to 65%. The left ventricle has normal function. The left ventricle has no regional wall motion abnormalities. The left ventricular internal cavity size was normal in size. There is  no left ventricular hypertrophy. Left ventricular diastolic parameters are indeterminate. Right Ventricle: The right ventricular size is normal. No increase in right ventricular wall thickness. Right ventricular systolic function is normal. Tricuspid regurgitation signal is inadequate for assessing PA pressure. Left Atrium: Left atrial size was normal in size. Right Atrium: Right atrial size was normal in size. Pericardium: There is no evidence of pericardial effusion. Mitral Valve: The mitral valve is normal in structure. Trivial mitral valve regurgitation. Tricuspid Valve: The tricuspid valve is normal in structure. Tricuspid valve regurgitation is trivial. Aortic Valve: The aortic valve is tricuspid. Aortic valve regurgitation is not visualized. Aortic valve sclerosis/calcification is present, without any evidence of aortic stenosis. Aortic valve mean gradient measures 4.0 mmHg. Aortic valve peak gradient measures 6.9 mmHg. Aortic valve area, by VTI measures  2.80 cm. Pulmonic Valve: The pulmonic valve was not well visualized. Pulmonic valve regurgitation is not visualized. Aorta: The aortic root is normal in size and structure. Venous: The inferior vena cava is normal in size with greater than 50% respiratory variability, suggesting right atrial pressure of 3 mmHg. IAS/Shunts: The interatrial septum was not well visualized.  LEFT VENTRICLE PLAX 2D LVIDd:         4.00 cm LVIDs:         2.50 cm LV PW:         1.00 cm LV IVS:        1.00 cm LVOT diam:     2.30 cm LV SV:         53 LV SV Index:   30 LVOT Area:     4.15 cm  LV Volumes (MOD) LV vol d, MOD A2C: 71.4 ml LV vol d, MOD A4C: 93.1 ml LV vol s, MOD A2C: 31.6 ml LV vol s, MOD A4C: 29.4 ml LV SV MOD A2C:     39.8 ml LV SV MOD A4C:     93.1 ml LV SV MOD BP:      50.6 ml RIGHT VENTRICLE RV S prime:     16.40 cm/s TAPSE (M-mode): 1.4 cm LEFT ATRIUM             Index LA diam:        3.00 cm 1.67 cm/m LA Vol (A2C):   11.0 ml 6.13 ml/m LA Vol (A4C):   19.4 ml 10.82 ml/m LA Biplane Vol: 14.4 ml 8.03 ml/m  AORTIC VALVE AV Area (Vmax):    2.69 cm AV Area (Vmean):   2.41 cm AV Area (VTI):     2.80 cm AV Vmax:           131.00 cm/s AV Vmean:          91.800 cm/s AV VTI:            0.190 m AV Peak Grad:      6.9 mmHg AV Mean Grad:      4.0 mmHg LVOT Vmax:         84.90 cm/s LVOT Vmean:        53.200 cm/s LVOT VTI:          0.128  m LVOT/AV VTI ratio: 0.67  AORTA Ao Root diam: 3.60 cm  SHUNTS Systemic VTI:  0.13 m Systemic Diam: 2.30 cm Oswaldo Milian MD Electronically signed by Oswaldo Milian MD Signature Date/Time: 09/10/2022/2:39:55 PM    Final    CT ABDOMEN PELVIS WO CONTRAST  Result Date: 08/26/2022 CLINICAL DATA:  Abdominal pain, acute, nonlocalized EXAM: CT ABDOMEN AND PELVIS WITHOUT CONTRAST TECHNIQUE: Multidetector CT imaging of the abdomen and pelvis was performed following the standard protocol without IV contrast. RADIATION DOSE REDUCTION: This exam was performed according to the departmental  dose-optimization program which includes automated exposure control, adjustment of the mA and/or kV according to patient size and/or use of iterative reconstruction technique. COMPARISON:  CT 04/01/2019 FINDINGS: Lower chest: Bibasilar atelectasis. Coronary artery atherosclerosis. Hepatobiliary: Unremarkable unenhanced appearance of the liver. No focal liver lesion identified. There is suggestion of gallbladder wall thickening and possibly trace pericholecystic fluid (series 3, image 24). No hyperdense gallstone. No biliary dilatation. Pancreas: Unremarkable. No pancreatic ductal dilatation or surrounding inflammatory changes. Spleen: Normal in size without focal abnormality. Adrenals/Urinary Tract: Unremarkable adrenal glands. Excreted contrast within the bilateral renal collecting systems and urinary bladder from recent contrasted exam. No hydronephrosis. Urinary bladder is decompressed by Foley catheter. Stomach/Bowel: An enteric tube extends to the level of the duodenal bulb. Stomach is distended with oral contrast. There are no dilated loops of bowel. There is a 2.8 cm segment of asymmetrically thickened distal sigmoid colon (series 5, images 56-59). Colonic diverticulosis. No focally inflamed diverticulum. There is liquid stool within the colon with numerous air-fluid levels. A rectal tube is in place. Vascular/Lymphatic: Extensive aortoiliac atherosclerosis with fusiform infrarenal abdominal aortic aneurysm measuring 4.4 cm in diameter (series 6, image 47), previously measured 4.2 cm in June of 2020. No abdominopelvic lymphadenopathy. Reproductive: No mass or other significant abnormality. Other: No free fluid. No abdominopelvic fluid collection. No pneumoperitoneum. No abdominal wall hernia. Musculoskeletal: No acute osseous abnormality. Severe degenerative changes of the left hip joint. Moderate multilevel lumbar spondylosis. IMPRESSION: 1. Liquid stool within the colon with numerous air-fluid levels,  suggestive of a diarrheal illness. No evidence of bowel obstruction or active bowel inflammation. 2. There is a 2.8 cm segment of asymmetrically thickened distal sigmoid colon. While this could be related to underdistention, a colonic mass is not excluded. Correlation with colonoscopy should be considered. 3. Gallbladder wall appears thickened with trace pericholecystic fluid. No hyperdense gallstone. Findings are nonspecific and could be related to liver disease, hypoalbuminemia, or cholecystitis. Correlate with liver enzymes and consider right upper quadrant ultrasound. 4. Infrarenal abdominal aortic aneurysm measuring 4.4 cm in diameter, previously measured 4.2 cm in June of 2020. Recommend follow-up every 12 months and vascular consultation. This recommendation follows ACR consensus guidelines: White Paper of the ACR Incidental Findings Committee II on Vascular Findings. J Am Coll Radiol 2013; 10:789-794. 5. Aortic and coronary artery atherosclerosis (ICD10-I70.0). 6. Additional chronic and/or incidental findings, as above. Electronically Signed   By: Davina Poke D.O.   On: 08/16/2022 11:18   CT HEAD WO CONTRAST (5MM)  Result Date: 08/20/2022 CLINICAL DATA:  Provided history: Altered mental status, nontraumatic. EXAM: CT HEAD WITHOUT CONTRAST TECHNIQUE: Contiguous axial images were obtained from the base of the skull through the vertex without intravenous contrast. RADIATION DOSE REDUCTION: This exam was performed according to the departmental dose-optimization program which includes automated exposure control, adjustment of the mA and/or kV according to patient size and/or use of iterative reconstruction technique. COMPARISON:  Head CT 12/28/2021.  Brain MRI  06/02/2021. FINDINGS: Brain: Moderate generalized cerebral atrophy.  Mild cerebellar atrophy. Redemonstrated chronic cortical/subcortical infarcts within the bilateral frontal lobes and superior left temporal lobe. Redemonstrated focus of chronic  encephalomalacia/gliosis within the anterolateral left temporal lobe, which may be posttraumatic in etiology or may reflect an additional chronic infarct. Background moderate patchy and ill-defined hypoattenuation within the cerebral white matter, nonspecific but compatible with chronic small vessel disease. There is no acute intracranial hemorrhage. No acute demarcated cortical infarct. No extra-axial fluid collection. No evidence of an intracranial mass. No midline shift. Vascular: No hyperdense vessel.  Atherosclerotic calcifications. Skull: No fracture or aggressive osseous lesion. Sinuses/Orbits: No mass or acute finding within the imaged orbits. Trace scattered paranasal sinus mucosal thickening. IMPRESSION: No evidence of acute intracranial abnormality. Redemonstrated chronic cortically-based infarcts within the bilateral frontal and left temporal lobes. Redemonstrated focus of chronic encephalomalacia/gliosis within the anterolateral left temporal lobe, which may be posttraumatic in etiology or may reflect an additional chronic infarct. Background parenchymal atrophy and chronic small vessel ischemic disease, as described. Electronically Signed   By: Kellie Simmering D.O.   On: 09/01/2022 11:09   CT Angio Chest PE W and/or Wo Contrast  Result Date: 09/12/2022 CLINICAL DATA:  Suspected pulmonary embolism. EXAM: CT ANGIOGRAPHY CHEST WITH CONTRAST TECHNIQUE: Multidetector CT imaging of the chest was performed using the standard protocol during bolus administration of intravenous contrast. Multiplanar CT image reconstructions and MIPs were obtained to evaluate the vascular anatomy. RADIATION DOSE REDUCTION: This exam was performed according to the departmental dose-optimization program which includes automated exposure control, adjustment of the mA and/or kV according to patient size and/or use of iterative reconstruction technique. CONTRAST:  42m OMNIPAQUE IOHEXOL 350 MG/ML SOLN COMPARISON:  Feb 13, 2022  FINDINGS: Cardiovascular: There is moderate severity calcification of the aortic arch with marked severity atherosclerosis seen within the descending thoracic aorta. Satisfactory opacification of the pulmonary arteries to the segmental level. No evidence of pulmonary embolism. Normal heart size with marked severity coronary artery calcification. No pericardial effusion. Mediastinum/Nodes: Endotracheal and nasogastric tubes are seen and properly position. Subcentimeter AP window and pretracheal lymph nodes are seen. Thyroid gland, trachea, and esophagus demonstrate no significant findings. Lungs/Pleura: There is marked severity emphysematous lung disease. An 8 mm calcified lung nodule is seen within the posterior aspect of the left upper lobe. Adjacent 3 mm and 4 mm noncalcified lung nodules are seen along the periphery the posterolateral right upper lobe (axial CT images 50 through 53, CT series 6). This represents a new finding when compared to the prior study. Mild lingular, right middle lobe and bilateral lower lobe atelectasis is seen. Bilateral lower lobe bronchiectasis is also noted. There is no evidence of a pleural effusion or pneumothorax. Upper Abdomen: Noninflamed diverticula are seen throughout the visualized portions of large bowel. Musculoskeletal: A nondisplaced fracture deformity is seen along the anterior aspect of the body of the sternum. Acute anterior second, third, fourth and fifth left rib fractures are also noted. Multilevel degenerative changes are seen throughout the thoracic spine. Review of the MIP images confirms the above findings. IMPRESSION: 1. No evidence of pulmonary embolism. 2. Marked severity emphysematous lung disease. 3. Mild lingular, right middle lobe and bilateral lower lobe atelectasis. 4. Bilateral lower lobe bronchiectasis. 5. Acute anterior second, third, fourth and fifth left rib fractures. 6. Acute nondisplaced fracture deformity along the anterior aspect of the body of  the sternum. 7. Colonic diverticulosis. 8. Marked severity coronary artery disease. 9. Aortic atherosclerosis. 10. Adjacent 3 mm and  4 mm noncalcified right upper lobe lung nodules. No follow-up needed if patient is low-risk (and has no known or suspected primary neoplasm). Non-contrast chest CT can be considered in 12 months if patient is high-risk. This recommendation follows the consensus statement: Guidelines for Management of Incidental Pulmonary Nodules Detected on CT Images: From the Fleischner Society 2017; Radiology 2017; 284:228-243. Aortic Atherosclerosis (ICD10-I70.0) and Emphysema (ICD10-J43.9). Electronically Signed   By: Virgina Norfolk M.D.   On: 09/09/2022 04:11   DG Chest Portable 1 View  Result Date: 08/14/2022 CLINICAL DATA:  Cardiac arrest. EXAM: PORTABLE CHEST 1 VIEW COMPARISON:  July 08, 2022 FINDINGS: Radiopaque defibrillator pads are seen. An endotracheal tube is in place with its distal tip approximately 3.8 cm from the carina. Limited visualization of the carina is noted. An enteric tube is also seen with its distal end extending below the level of the diaphragm. The heart size and mediastinal contours are within normal limits. Mild atelectasis is seen within the bilateral lung bases. There is no evidence of a pleural effusion or pneumothorax. Multilevel degenerative changes seen throughout the thoracic spine. IMPRESSION: 1. Endotracheal tube and orogastric tube positioning, as described above. 2. Mild bibasilar atelectasis. Electronically Signed   By: Virgina Norfolk M.D.   On: 08/26/2022 03:11   US Abdomen Complete  Result Date: 08/13/2022 CLINICAL DATA:  Abdominal pain EXAM: ABDOMEN ULTRASOUND COMPLETE COMPARISON:  None Available. FINDINGS: Gallbladder: No gallstones or wall thickening visualized. No sonographic Murphy sign noted by sonographer. Common bile duct: Diameter: 4.3 mm Liver: No focal lesion identified. Within normal limits in parenchymal echogenicity.  Portal vein is patent on color Doppler imaging with normal direction of blood flow towards the liver. IVC: No abnormality visualized. Pancreas: Visualized portion unremarkable. Spleen: Spleen is not sonographically visualized. Right Kidney: Length: 8.6 cm. There is no hydronephrosis. There is slightly increased cortical echogenicity. Left Kidney: Length: 9.8 cm. There is no hydronephrosis. There is increased cortical echogenicity. Abdominal aorta: There is aneurism in infrarenal abdominal aorta measuring 4.1 cm in AP diameter. Transverse diameter is 4.5 cm. Other findings: None. IMPRESSION: There is infrarenal aortic aneurysm measuring 4.1 x 4.5 cm. Recommend follow-up every 12 months and vascular consultation.Reference: J Am Coll Radiol 2376;28:315-176. There is increased cortical echogenicity in the kidneys, possibly suggesting medical renal disease. There is no hydronephrosis. Spleen is not adequately visualized for evaluation. Abdomen sonogram is otherwise unremarkable. Electronically Signed   By: Elmer Picker M.D.   On: 08/13/2022 14:55    Microbiology Recent Results (from the past 240 hour(s))  Urine Culture     Status: None   Collection Time: 08/26/2022  4:08 AM   Specimen: Urine, Clean Catch  Result Value Ref Range Status   Specimen Description URINE, CLEAN CATCH  Final   Special Requests NONE  Final   Culture   Final    NO GROWTH Performed at [facility], [place] [address] [place], [place] [address]    Report Status September 13, 2022 FINAL  Final  Resp Panel by RT-PCR (Flu A&B, Covid) Anterior Nasal Swab     Status: None   Collection Time: 09/10/2022  4:26 AM   Specimen: Anterior Nasal Swab  Result Value Ref Range Status   SARS Coronavirus 2 by RT PCR NEGATIVE NEGATIVE Final    Comment: (NOTE) SARS-CoV-2 target nucleic acids are NOT DETECTED.  The SARS-CoV-2 RNA is generally detectable in upper respiratory specimens during the acute phase of infection. The lowest concentration of  SARS-CoV-2 viral copies this assay  can detect is 138 copies/mL. A negative result does not preclude SARS-Cov-2 infection and should not be used as the sole basis for treatment or other patient management decisions. A negative result may occur with  improper specimen collection/handling, submission of specimen other than nasopharyngeal swab, presence of viral mutation(s) within the areas targeted by this assay, and inadequate number of viral copies(<138 copies/mL). A negative result must be combined with clinical observations, patient history, and epidemiological information. The expected result is Negative.  Fact Sheet for Patients:  EntrepreneurPulse.com.au  Fact Sheet for Healthcare Providers:  IncredibleEmployment.be  This test is no t yet approved or cleared by the [place] FDA and  has been authorized for detection and/or diagnosis of SARS-CoV-2 by FDA under an Emergency Use Authorization (EUA). This EUA will remain  in effect (meaning this test can be used) for the duration of the COVID-19 declaration under Section 564(b)(1) of the Act, 21 U.S.C.section 360bbb-3(b)(1), unless the authorization is terminated  or revoked sooner.       Influenza A by PCR NEGATIVE NEGATIVE Final   Influenza B by PCR NEGATIVE NEGATIVE Final    Comment: (NOTE) The Xpert Xpress SARS-CoV-2/FLU/RSV plus assay is intended as an aid in the diagnosis of influenza from Nasopharyngeal swab specimens and should not be used as a sole basis for treatment. Nasal washings and aspirates are unacceptable for Xpert Xpress SARS-CoV-2/FLU/RSV testing.  Fact Sheet for Patients: EntrepreneurPulse.com.au  Fact Sheet for Healthcare Providers: IncredibleEmployment.be  This test is not yet approved or cleared by the [place] FDA and has been authorized for detection and/or diagnosis of SARS-CoV-2 by FDA under an Emergency Use  Authorization (EUA). This EUA will remain in effect (meaning this test can be used) for the duration of the COVID-19 declaration under Section 564(b)(1) of the Act, 21 U.S.C. section 360bbb-3(b)(1), unless the authorization is terminated or revoked.  Performed at [facility], [place] [address] [place], [place] [address]   Culture, blood (Routine X 2) w Reflex to ID Panel     Status: None (Preliminary result)   Collection Time: 09/06/2022  4:39 AM   Specimen: BLOOD RIGHT HAND  Result Value Ref Range Status   Specimen Description BLOOD RIGHT HAND  Final   Special Requests   Final    BOTTLES DRAWN AEROBIC AND ANAEROBIC Blood Culture adequate volume   Culture   Final    NO GROWTH 1 DAY Performed at [facility], [place] [address], [place], [place] [address]    Report Status PENDING  Incomplete  MRSA Next Gen by PCR, Nasal     Status: None   Collection Time: 08/19/2022  6:05 AM   Specimen: Nasal Mucosa; Nasal Swab  Result Value Ref Range Status   MRSA by PCR Next Gen NOT DETECTED NOT DETECTED Final    Comment: (NOTE) The GeneXpert MRSA Assay (FDA approved for NASAL specimens only), is one component of a comprehensive MRSA colonization surveillance program. It is not intended to diagnose MRSA infection nor to guide or monitor treatment for MRSA infections. Test performance is not FDA approved in patients less than 28 years old. Performed at [facility], [place] [address], [place], [place] [address]   Culture, blood (Routine X 2) w Reflex to ID Panel     Status: None (Preliminary result)   Collection Time: 08/30/2022  6:57 AM   Specimen: BLOOD LEFT HAND  Result Value Ref Range Status   Specimen Description BLOOD LEFT HAND  Final  Special Requests   Final    BOTTLES DRAWN AEROBIC ONLY Blood Culture results may not be optimal due to an inadequate volume of blood received in culture bottles   Culture   Final    NO GROWTH < 24 HOURS Performed at [place] [address], [place], [place] [address]    Report Status PENDING  Incomplete    Lab Basic Metabolic Panel: Recent Labs  Lab 09/08/2022 0220 08/28/2022 0226 08/26/2022 0439 08/28/2022 0626  NA 139 136  136 139 137  K 4.7 4.5  4.5 3.8 3.1*  CL 101 99 101  --   CO2 19*  --  20*  --   GLUCOSE 159* 157* 146*  --   BUN '11 13 12  '$ --   CREATININE 1.20 1.00 1.08  --   CALCIUM 9.4  --  9.4  --   MG  --   --  1.5*  --    Liver Function Tests: Recent Labs  Lab 08/21/2022 0220 08/23/2022 0439  AST 48* 48*  ALT 25 27  ALKPHOS 39 45  BILITOT 0.8 1.2  PROT 5.8* 6.0*  ALBUMIN 3.3* 3.5   Recent Labs  Lab 08/22/2022 0439  LIPASE 35   Recent Labs  Lab 09/04/2022 0439  AMMONIA 59*   CBC: Recent Labs  Lab 08/26/2022 0220 09/04/2022 0226 08/22/2022 0626  WBC 13.2*  --   --   NEUTROABS 5.8  --   --   HGB 12.8* 13.3  13.6 13.9  HCT 40.7 39.0  40.0 41.0  MCV 96.7  --   --   PLT 113*  --   --    Cardiac Enzymes: No results for input(s): "CKTOTAL", "CKMB", "CKMBINDEX", "TROPONINI" in the last 168 hours. Sepsis Labs: Recent Labs  Lab 09/06/2022 0220 08/29/2022 0439 08/17/2022 0647  PROCALCITON  --  0.16  --   WBC 13.2*  --   --   LATICACIDVEN 7.6* 4.2* 4.4*

## 2022-09-13 NOTE — Procedures (Signed)
Extubation Procedure Note  Patient Details:   Name: [name] DOB: 1930/09/29 MRN: [id number]   Airway Documentation:    Vent end date: (not recorded) Vent end time: (not recorded)   Evaluation  O2 sats: stable throughout Complications: No apparent complications Patient did tolerate procedure well. Bilateral Breath Sounds: Clear, Diminished   No  Patient extubated to comfort care per order at 1240. No complications encountered. RN at bedside.  Levin Bacon 09-11-22, 2:33 PM

## 2022-09-13 NOTE — Progress Notes (Signed)
Pt. Expired at 1428 pronounced by 2 RN.

## 2022-09-13 NOTE — Procedures (Signed)
Patient Name: [name]  MRN: [id number]  Epilepsy Attending: Lora Havens  Referring Physician/Provider: Erick Colace, NP  Duration: 08/25/2022 1618 to 08/28/2022 3212  Patient history: 88yo M s/p cardiac arrest. EEG to evaluate for seizure  Level of alertness: comatose  AEDs during EEG study: Versed, LEV, VPA  Technical aspects: This EEG study was done with scalp electrodes positioned according to the 10-20 International system of electrode placement. Electrical activity was reviewed with band pass filter of 1-'70Hz'$ , sensitivity of 7 uV/mm, display speed of 72m/sec with a '60Hz'$  notched filter applied as appropriate. EEG data were recorded continuously and digitally stored.  Video monitoring was available and reviewed as appropriate.  Description: Att the beginning of study, eeg showed near continuous background suppression admixed with generalized poyspikes which gradually became more frequent and appeared periodic at 2-2.'5hz'$ . After around 2200,  generalized epileptiform bursts were noted which at times appeared rhythmic with evolution in morphology and frequency consistent with seizure. No clinical signs were noted. Hyperventilation and photic stimulation were not performed.     ABNORMALITY -Seizure without clinical signs, generalized -Burst suppression with highly epileptiform burst, generalized  IMPRESSION: This study showed seizures without clinical signs with generalized onset. Additionally there was evidence of profound diffuse encephalopathy.  In the setting of cardiac arrest, this EEG pattern is most likely secondary to anoxic/hypoxic brain injury.   [name] [name]

## 2022-09-13 NOTE — Progress Notes (Signed)
LTM EEG discontinued - no skin breakdown at unhook.   

## 2022-09-13 NOTE — Progress Notes (Signed)
NAME:  [name], MRN:  [id number], DOB:  [birth year], LOS: 0 ADMISSION DATE:  09/08/2022, CONSULTATION DATE:  11/4 REFERRING MD:  Dr. Leonette Monarch, CHIEF COMPLAINT:  cardiac arrest   History of Present Illness:  Patient is a 88 year old male with pertinent PMH of COPD on 3 LNC (seen by Dr. Chase Caller outpatient), diastolic CHF, HTN, HLD, CAD with history of 3 stents placed, prior DVTs not on any [facility] presents to [facility] ED on 11/4 s/p cardiac arrest.  Patient recently seen in [facility] ED on 9/25 for COPD exacerbation and was sent home on steroids.  Was started on Roflumilast outpatient by Dr. Chase Caller.  Later in October patient with some generalized abdominal pain was seen by PCP but exam was benign.  On 11/4, family witnessed patient having SOB on knees.  They placed him back on oxygen and try to get him up in bed.  Patient then became unresponsive and agonal breathing and EMS called.  CPR started by daughter.  Unknown amount of time before fire department arrival.  Fire department continued CPR and placed [facility] airway.  ROSC obtained 10 minutes after fire department arrival.  Was placed on epi drip and required intermittent pacing for bradycardia in route to [facility] [place] [facility] ED.  Upon arrival to [facility] ED, patient on epi drip.  [place] airway exchanged for ETT.  EKG with no ST elevation.  CXR showing ETT in good position with mild atelectasis bilaterally.  Patient hypothermic 94.7 F.  CBG 157.  Patient given IV fluids.  Initial ABG 7.19, 64, 149, 25.  AG 19.  LA 7.6.  WBC 13.2, Hgb 12.8, platelets 113.  BNP 90.2.  UA rare bacteria and leukocytes.  CTA chest performed; results pending.  PCCM consulted for ICU admission.  Pertinent  Medical History   Past Medical History:  Diagnosis Date   Anxiety    Asthma    Chronic diastolic CHF (congestive heart failure) (HCC)    diastolic    COPD (chronic obstructive pulmonary disease) (HCC)    Coronary artery disease    s/p PCI of RCA 03/2009 at which time there was 70% ramus and 90%  RCA, cath 01/2011 showed patent stents in RCA with moderate prox disesae, aneurysmal left circ and small 90% ramus s/p PCI, patent LAD EF 55%   Diabetes mellitus    Diverticulitis Oct. 2013   bleeding in the past and Effient for his CAD was stopped   DVT (deep venous thrombosis) (HCC)    GERD (gastroesophageal reflux disease)    Hypercholesterolemia    Hypertension    Hypothyroidism    Obesity (BMI 30-39.9)    OSA (obstructive sleep apnea)    severe on CPAP   Peripheral vascular disease ([place]) 11/2006   s/p left stent   Shortness of breath    Sleep apnea    severe OSA awaiting CPAP titration     Significant Hospital Events: Including procedures, antibiotic start and stop dates in addition to other pertinent events   11/4 admitted to [facility] cardiac arrest and intubated. CT chest neg PE, marked emphysema, mid lingular and RML ATX, Acute anterior second, third, fourth and fifth left rib fractures. Acute nondisplaced fracture deformity along the anterior aspect Marked severity coronary artery disease.  11/5: Ongoing myoclonic activity consistent with severe anoxic brain injury.  Long-discussion with family by both critical care team and neurology.  Family wishing to transition to comfort care.  Started on morphine drip and Versed drip Interim History / Subjective:  Clinically worse Objective  Blood pressure (!) 176/91, pulse 80, temperature (!) 95.4 F (35.2 C), resp. rate 18, height '5\' 3"'$  (1.6 m), weight 76 kg, SpO2 100 %.    Vent Mode: PRVC FiO2 (%):  [100 %] 100 % Set Rate:  [18 bmp] 18 bmp Vt Set:  [450 mL] 450 mL PEEP:  [5 cmH20] 5 cmH20 Plateau Pressure:  [16 cmH20] 16 cmH20  No intake or output data in the 24 hours ending 08/14/2022 0318 Filed Weights   09/09/2022 0317  Weight: 76 kg    Examination: General 88 year old male patient still on full ventilator support ongoing myoclonic activity over evening hours HEENT normocephalic atraumatic orally intubated Pulmonary: Coarse  scattered rhonchi Cardiac: Regular rate and rhythm Neuro: Unresponsive, has persistent intermittent myoclonic activity, not responding to stimulus Abdomen: Soft nontender Extremities: Cool GU: Clear yellow   Resolved Hospital Problem list     Assessment & Plan:  Cardiac arrest: Acute on chronic respiratory failure with hypercapnia COPD w asthma overlap Multiple rib fractures  OSA Septic shock w/ lactic acidosis  Hypothermia Possible UTI:  Abdominal pain: Acute encephalopathy w/ Myoclonus c/w severe anoxic brain injury  L>R LE Edema Prior DVTs: Not on AC Thrombocytopenia Mild anemia CAD: History of previous stents Chronic diastolic HF: Last echo 05/5276: LVEF 55 to 82%; grade 1 diastolic dysfunction HTN HLD DMT2 Hypothyroidism Depression  Discussion Severe anoxic brain injury status post cardiac arrest complicated by septic shock.  Has myoclonic status epilepticus.  After a long discussion with family in regards to the patient's wishes he would not want to continue life support in the setting they unanimously agree to proceed with comfort care  Plan Discontinue all pressors Discontinue LTM Initiate morphine drip for pain Initiate Versed drip for myoclonus Extubate Full DNR Full comfort care   Best Practice (right click and "Reselect all SmartList Selections" daily)   Diet/type: NPO w/ meds via tube DVT prophylaxis: SCD; awaiting CT head results GI prophylaxis: H2B Lines: N/A Foley:  Yes, and it is still needed Code Status:  DNR Last date of multidisciplinary goals of care discussion [11/4 spoke with daughters at bedside and they would like patient to remain full code at this time. They state that patient would not want to be on vent long term. Made DNR after discussion w/ daughter [name] this am ]    Critical care time: 5 min      Erick Colace ACNP-BC [place] Pager # [phone] OR # [phone] if no answer

## 2022-09-13 NOTE — Consult Note (Signed)
Neurology Consultation  Reason for Consult: Postcardiac arrest myoclonic seizures Referring Physician: Dr. Mauri Brooklyn, CCM  CC: Postcardiac arrest myoclonus  History is obtained from: Chart review  HPI: [name] is a 88 y.o. male past history of COPD, diastolic CHF, hypertension hyperlipidemia CAD prior DVTs not on anticoagulation presented to [facility] ED 08/24/2022 status post cardiac arrest witnessed by family having some shortness of breath then he went down on his knees they placed him back on oxygen try to get him up to bed he became unresponsive and agonal.  EMS called.  CPR started by daughter.  Unknown amount of downtime before fire department arrival.  Fire department continued CPR and placed [facility] airway.  ROSC obtained at 10 minutes after fire department arrival.  Placed on epinephrine drip and required intermittent pacing for bradycardia on route to [facility] ED.  [facility] airway exchanged for ETT, no ST elevation on EKG.  Chest x-ray with ET tube in good place.  Admitted to ICU.  RN reported stimulus induced facial twitching.  Sometime around yesterday afternoon hooked up to EEG.  Generalized suppression pattern along with generalized polyspikes noted.  Overnight bar suppression pattern and myoclonic status epilepticus.  Neurology consult for the above. Family discussions ongoing-for now DNR but full scope of care.  I spoke with the patient's daughter at bedside.  At this point, after careful consideration of the options and after listening to me, the family is getting closer towards withdrawal of care.  She inquired about the MRI and I told her that the MRI is really not going to change the outcome.    [id number] to obtain due to altered mental status.   Past Medical History:  Diagnosis Date   Anxiety    Asthma    Chronic diastolic CHF (congestive heart failure) (HCC)    diastolic    COPD (chronic obstructive pulmonary disease) ([place])    Coronary artery disease    s/p PCI of RCA  03/2009 at which time there was 70% ramus and 90% RCA, cath 01/2011 showed patent stents in RCA with moderate prox disesae, aneurysmal left circ and small 90% ramus s/p PCI, patent LAD EF 55%   Diabetes mellitus    Diverticulitis Oct. 2013   bleeding in the past and Effient for his CAD was stopped   DVT (deep venous thrombosis) (HCC)    GERD (gastroesophageal reflux disease)    Hypercholesterolemia    Hypertension    Hypothyroidism    Obesity (BMI 30-39.9)    OSA (obstructive sleep apnea)    severe on CPAP   Peripheral vascular disease ([place]) 11/2006   s/p left stent   Shortness of breath    Sleep apnea    severe OSA awaiting CPAP titration     Family History  Problem Relation Age of Onset   Diabetes Mother    Hyperlipidemia Mother    Hypertension Mother    Heart attack Mother    Heart disease Mother        before age 32   Diabetes Brother    Heart disease Father        before age 43   Breast cancer Sister    Diabetes Sister    Hyperlipidemia Sister    Hypertension Sister    Heart attack Sister    Hyperlipidemia Sister    Hypertension Sister    Heart disease Sister        Heart Disease before age 45   Asthma Sister    Malignant  hyperthermia Neg Hx      Social History:   reports that he quit smoking about 58 years ago. His smoking use included cigarettes and cigars. He has a 7.50 pack-year smoking history. He has never used smokeless tobacco. He reports that he does not drink alcohol and does not use drugs.  Medications  Current Facility-Administered Medications:    0.9 %  sodium chloride infusion, 250 mL, Intravenous, Continuous, Cardama, Grayce Sessions, MD   [COMPLETED] sodium chloride 0.9 % bolus 1,000 mL, 1,000 mL, Intravenous, Once, Stopped at 08/21/2022 0345 **FOLLOWED BY** [COMPLETED] sodium chloride 0.9 % bolus 1,000 mL, 1,000 mL, Intravenous, Once, Stopped at 08/30/2022 0345 **FOLLOWED BY** 0.9 %  sodium chloride infusion, 1,000 mL, Intravenous, Continuous, Cardama,  Grayce Sessions, MD, Last Rate: 125 mL/hr at 09-06-2022 0400, Infusion Verify at 09-06-2022 0400   0.9 %  sodium chloride infusion, 250 mL, Intravenous, Continuous, Rollene Rotunda, John D, PA-C, Last Rate: 10 mL/hr at 09-06-2022 0400, Infusion Verify at 2022-09-06 0400   acetaminophen (TYLENOL) tablet 650 mg, 650 mg, Oral, Q4H **OR** acetaminophen (TYLENOL) 160 MG/5ML solution 650 mg, 650 mg, Per Tube, Q4H, 650 mg at 06-Sep-2022 0413 **OR** acetaminophen (TYLENOL) suppository 650 mg, 650 mg, Rectal, Q4H, Rollene Rotunda, John D, PA-C   acetaminophen (TYLENOL) tablet 650 mg, 650 mg, Oral, Q4H PRN **OR** acetaminophen (TYLENOL) 160 MG/5ML solution 650 mg, 650 mg, Per Tube, Q4H PRN **OR** acetaminophen (TYLENOL) suppository 650 mg, 650 mg, Rectal, Q4H PRN, Rollene Rotunda, John D, PA-C   budesonide (PULMICORT) nebulizer solution 0.25 mg, 0.25 mg, Nebulization, BID, Payne, John D, PA-C, 0.25 mg at 08/29/2022 1935   busPIRone (BUSPAR) tablet 30 mg, 30 mg, Oral, Q8H PRN **OR** busPIRone (BUSPAR) tablet 30 mg, 30 mg, Per Tube, Q8H PRN, Andres Labrum D, PA-C   ceFEPIme (MAXIPIME) 2 g in sodium chloride 0.9 % 100 mL IVPB, 2 g, Intravenous, Q12H, Ruthann Cancer, Jessica, DO, Stopped at 09/03/2022 2326   Chlorhexidine Gluconate Cloth 2 % PADS 6 each, 6 each, Topical, Daily, Audria Nine, DO, 6 each at 08/16/2022 0615   docusate (COLACE) 50 MG/5ML liquid 100 mg, 100 mg, Per Tube, BID, Rollene Rotunda, John D, PA-C   EPINEPHrine (ADRENALIN) 5 mg in NS 250 mL (0.02 mg/mL) premix infusion, 0.5-20 mcg/min, Intravenous, Titrated, Cardama, Grayce Sessions, MD, Paused at 08/15/2022 0329   ezetimibe (ZETIA) tablet 10 mg, 10 mg, Per Tube, Daily, Andres Labrum D, PA-C, 10 mg at 09/02/2022 1112   famotidine (PEPCID) tablet 20 mg, 20 mg, Per Tube, Daily, Erick Colace, NP   fentaNYL (SUBLIMAZE) injection 25 mcg, 25 mcg, Intravenous, Q15 min PRN, Cardama, Grayce Sessions, MD   fentaNYL (SUBLIMAZE) injection 25 mcg, 25 mcg, Intravenous, Q15 min PRN, Rollene Rotunda, John D, PA-C   fentaNYL (SUBLIMAZE)  injection 25-100 mcg, 25-100 mcg, Intravenous, Q30 min PRN, Cardama, Grayce Sessions, MD   fentaNYL (SUBLIMAZE) injection 25-100 mcg, 25-100 mcg, Intravenous, Q30 min PRN, Rollene Rotunda, John D, PA-C   insulin aspart (novoLOG) injection 0-9 Units, 0-9 Units, Subcutaneous, Q4H, Payne, John D, PA-C, 2 Units at 09/06/2022 0400   ipratropium-albuterol (DUONEB) 0.5-2.5 (3) MG/3ML nebulizer solution 3 mL, 3 mL, Nebulization, Q4H, Payne, John D, PA-C, 3 mL at 06-Sep-2022 0332   ipratropium-albuterol (DUONEB) 0.5-2.5 (3) MG/3ML nebulizer solution 3 mL, 3 mL, Nebulization, Q6H PRN, Rollene Rotunda, John D, PA-C   levETIRAcetam (KEPPRA) IVPB 1500 mg/ 100 mL premix, 1,500 mg, Intravenous, Q12H, Erick Colace, NP, Stopped at 09-06-2022 0146   levothyroxine (SYNTHROID) tablet 150 mcg, 150 mcg, Per Tube, QPM, Rollene Rotunda,  Chrystie Nose, PA-C, 150 mcg at 08/29/2022 1755   methylPREDNISolone sodium succinate (SOLU-MEDROL) 40 mg/mL injection 40 mg, 40 mg, Intravenous, Q12H, Mick Sell, PA-C, 40 mg at 09/14/22 0413   metroNIDAZOLE (FLAGYL) IVPB 500 mg, 500 mg, Intravenous, BID, Mick Sell, PA-C, Stopped at 14-Sep-2022 0059   midazolam (VERSED) injection 1-2 mg, 1-2 mg, Intravenous, Q1H PRN, Cardama, Grayce Sessions, MD, 2 mg at 08/25/2022 1050   norepinephrine (LEVOPHED) '4mg'$  in 232m (0.016 mg/mL) premix infusion, 2-10 mcg/min, Intravenous, Titrated, Cardama, PGrayce Sessions MD, Stopped at 09/06/2022 1404   Oral care mouth rinse, 15 mL, Mouth Rinse, Q2H, Marshall, Jessica, DO, 15 mL at 112/02/20230401   Oral care mouth rinse, 15 mL, Mouth Rinse, PRN, MAudria Nine DO   polyethylene glycol (MIRALAX / GLYCOLAX) packet 17 g, 17 g, Per Tube, Daily, PRollene Rotunda John D, PA-C   roflumilast (DALIRESP) tablet 500 mcg, 500 mcg, Per Tube, Daily, PMick Sell PA-C, 500 mcg at 08/16/2022 1112   rosuvastatin (CRESTOR) tablet 20 mg, 20 mg, Per Tube, Daily, PAndres LabrumD, PA-C, 20 mg at 08/30/2022 1112   sertraline (ZOLOFT) tablet 50 mg, 50 mg, Per Tube, QHS, PMick Sell PA-C,  50 mg at 09/10/2022 2241   vancomycin (VANCOCIN) IVPB 1000 mg/200 mL premix, 1,000 mg, Intravenous, Q24H, von Dohlen, Haley B, RPH   Exam: Current vital signs: BP 109/62   Pulse (!) 116   Temp 99 F (37.2 C)   Resp 20   Ht '5\' 3"'$  (1.6 m)   Wt 74.9 kg   SpO2 96%   BMI 29.25 kg/m  Vital signs in last 24 hours: Temp:  [95.1 F (35.1 C)-99.1 F (37.3 C)] 99 F (37.2 C) (11/05 0430) Pulse Rate:  [68-122] 116 (11/05 0430) Resp:  [0-26] 20 (11/05 0430) BP: (52-159)/(41-95) 109/62 (11/05 0430) SpO2:  [93 %-99 %] 96 % (11/05 0430) FiO2 (%):  [40 %-60 %] 40 % (11/05 0400) Weight:  [74.9 kg] 74.9 kg (11/05 0400)  General: Intubated, no sedation HEENT: Normocephalic atraumatic Lungs: Vented Neurological exam He is completely comatose No spontaneous movements No response to voice or noxious stimulation Cranial nerves: His pupils are sluggishly reactive bilaterally, his corneal reflexes are present, his eyes are somewhat downward gazing with haphazard nystagmus, he is not breathing over the ventilator. Motor exam: No spontaneous movement Sensory exam: No withdrawal to noxious simulation   Labs I have reviewed labs in epic and the results pertinent to this consultation are:  CBC    Component Value Date/Time   WBC 13.2 (H) 08/23/2022 0220   RBC 4.21 (L) 08/25/2022 0220   HGB 13.9 08/16/2022 0626   HGB 13.8 01/26/2018 0956   HCT 41.0 09/10/2022 0626   HCT 41.4 01/26/2018 0956   PLT 113 (L) 08/16/2022 0220   PLT 166 01/26/2018 0956   MCV 96.7 09/12/2022 0220   MCV 88 01/26/2018 0956   MCH 30.4 08/25/2022 0220   MCHC 31.4 08/22/2022 0220   RDW 12.8 09/07/2022 0220   RDW 14.1 01/26/2018 0956   LYMPHSABS 5.6 (H) 08/31/2022 0220   LYMPHSABS 2.1 12/05/2016 0957   MONOABS 0.8 08/20/2022 0220   EOSABS 0.6 (H) 08/16/2022 0220   EOSABS 0.2 12/05/2016 0957   BASOSABS 0.1 09/08/2022 0220   BASOSABS 0.0 12/05/2016 0957    CMP     Component Value Date/Time   NA 137 09/02/2022  0626   NA 140 07/07/2020 0948   K 3.1 (L) 08/26/2022 0626   CL 101 09/01/2022  0439   CO2 20 (L) 09/09/2022 0439   GLUCOSE 146 (H) 09/03/2022 0439   BUN 12 09/06/2022 0439   BUN 14 07/07/2020 0948   CREATININE 1.08 08/30/2022 0439   CREATININE 1.43 (H) 04/12/2022 1556   CALCIUM 9.4 08/25/2022 0439   PROT 6.0 (L) 09/07/2022 0439   PROT 6.4 07/07/2020 0948   ALBUMIN 3.5 09/02/2022 0439   ALBUMIN 4.0 07/07/2020 0948   AST 48 (H) 08/29/2022 0439   ALT 27 09/03/2022 0439   ALKPHOS 45 08/19/2022 0439   BILITOT 1.2 08/23/2022 0439   BILITOT 0.4 07/07/2020 0948   GFRNONAA >60 09/06/2022 0439   GFRAA 63 07/07/2020 0948    Lipid Panel     Component Value Date/Time   CHOL 125 07/07/2020 0948   TRIG 72 07/07/2020 0948   HDL 53 07/07/2020 0948   CHOLHDL 2.4 07/07/2020 0948   CHOLHDL 4 11/16/2014 1124   VLDL 19.8 11/16/2014 1124   LDLCALC 57 07/07/2020 0948   EEG shows abundant generalized polyspikes as well as generalized EEG suppression and intermittent seizures on preliminary review.  Imaging I have reviewed the images obtained:  CT-head on arrival-unremarkable  Assessment:  88 year old man with extensive COPD and other comorbidities, with unknown downtime of cardiac arrest, now currently intubated, no sedation with EEG showing generalized polyspikes, burst suppression pattern as well as intermittent seizure activity along with evidence of stimulus induced myoclonus.  His exam shows comatose individual with sluggish pupillary responses who is not breathing over the ventilator and has present corneal reflexes with no purposeful movements on exam. Given his advanced age and comorbidities as well as significant cardiac arrest with appearance of myoclonus so soon after cardiac arrest, the current EEG pattern-it is highly unlikely that he will have neurologically meaningful recovery going forward. Till the time that he is full scope of care, I will put my recommendations and for seizure  control but I have spoken to the daughter in detail who seems to be getting ready to transition to comfort measures only.   Impression Severe anoxic brain injury status postcardiac arrest Myoclonic status epilepticus  Recommendations: Currently on Keppra I will add a load of valproate-decision on continuing standing doses based on further goals of care discussions I will also start him on propofol.  May need pressor support while on propofol. Continue LTM EEG until full scope of care. Family leaning towards comfort measures.  They were told that an MRI might be done today or tomorrow but I discussed with them and told them that the MRI is really not going to change outcome and irrespective of its findings, the current clinical exam and EEG findings are pretty convincing for severe anoxic brain injury.  The daughter says he was very clear that he does not want prolonged ventilation and she will discuss with the morning rounding team about possible withdrawal and comfort measures. We will continue to follow if the family decides to continue full scope of care.  Plan was relayed to Mikki Harbor, PA-C   -- Amie Portland, MD Neurologist [facility] Pager: [phone]  CRITICAL CARE ATTESTATION Performed by: Amie Portland, MD Total critical care time: 50 minutes Critical care time was exclusive of separately billable procedures and treating other patients and/or supervising APPs/Residents/Students Critical care was necessary to treat or prevent imminent or life-threatening deterioration due to postcardiac arrest anoxic brain injury This patient is critically ill and at significant risk for neurological worsening and/or death and care requires constant monitoring. Critical care was time  spent personally by me on the following activities: development of treatment plan with patient and/or surrogate as well as nursing, discussions with consultants, evaluation of patient's response to  treatment, examination of patient, obtaining history from patient or surrogate, ordering and performing treatments and interventions, ordering and review of laboratory studies, ordering and review of radiographic studies, pulse oximetry, re-evaluation of patient's condition, participation in multidisciplinary rounds and medical decision making of high complexity in the care of this patient.

## 2022-09-13 DEATH — deceased
